# Patient Record
Sex: Female | Born: 1980 | Race: Black or African American | Hispanic: No | Marital: Single | Smoking: Former smoker
Health system: Southern US, Community
[De-identification: ages and names within clinical notes are randomized; demographics above are authoritative.]

## PROBLEM LIST (undated history)

## (undated) DIAGNOSIS — I5022 Chronic systolic (congestive) heart failure: Secondary | ICD-10-CM

## (undated) DIAGNOSIS — R0683 Snoring: Secondary | ICD-10-CM

## (undated) DIAGNOSIS — E119 Type 2 diabetes mellitus without complications: Secondary | ICD-10-CM

## (undated) DIAGNOSIS — H9190 Unspecified hearing loss, unspecified ear: Secondary | ICD-10-CM

## (undated) DIAGNOSIS — J45909 Unspecified asthma, uncomplicated: Secondary | ICD-10-CM

## (undated) DIAGNOSIS — I340 Nonrheumatic mitral (valve) insufficiency: Secondary | ICD-10-CM

## (undated) HISTORY — DX: Unspecified asthma, uncomplicated: J45.909

## (undated) HISTORY — DX: Unspecified hearing loss, unspecified ear: H91.90

## (undated) HISTORY — PX: TONSILLECTOMY: SUR1361

---
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
## 2004-10-09 DIAGNOSIS — O1490 Unspecified pre-eclampsia, unspecified trimester: Secondary | ICD-10-CM

## 2007-11-15 HISTORY — PX: UNILATERAL SALPINGECTOMY: SHX6160

## 2015-02-23 ENCOUNTER — Telehealth (HOSPITAL_COMMUNITY): Payer: Self-pay | Admitting: Vascular Surgery

## 2015-03-02 NOTE — Telephone Encounter (Signed)
Left pt a message for new pt appt

## 2015-03-22 ENCOUNTER — Encounter (HOSPITAL_COMMUNITY): Payer: Self-pay | Admitting: *Deleted

## 2015-03-22 ENCOUNTER — Inpatient Hospital Stay (HOSPITAL_COMMUNITY): Admission: RE | Admit: 2015-03-22 | Payer: Self-pay | Source: Ambulatory Visit

## 2015-08-16 DIAGNOSIS — Z6841 Body Mass Index (BMI) 40.0 and over, adult: Secondary | ICD-10-CM

## 2015-11-01 DIAGNOSIS — I429 Cardiomyopathy, unspecified: Secondary | ICD-10-CM | POA: Insufficient documentation

## 2016-01-14 ENCOUNTER — Emergency Department (HOSPITAL_COMMUNITY): Payer: Medicaid Other

## 2016-01-14 ENCOUNTER — Inpatient Hospital Stay (HOSPITAL_COMMUNITY)
Admission: EM | Admit: 2016-01-14 | Discharge: 2016-01-20 | DRG: 286 | Disposition: A | Discharge status: Home / Self Care | Payer: Medicaid Other | Attending: Internal Medicine | Admitting: Internal Medicine

## 2016-01-14 ENCOUNTER — Encounter (HOSPITAL_COMMUNITY): Payer: Self-pay | Admitting: *Deleted

## 2016-01-14 DIAGNOSIS — R1013 Epigastric pain: Secondary | ICD-10-CM | POA: Diagnosis present

## 2016-01-14 DIAGNOSIS — R1011 Right upper quadrant pain: Secondary | ICD-10-CM | POA: Diagnosis present

## 2016-01-14 DIAGNOSIS — H9193 Unspecified hearing loss, bilateral: Secondary | ICD-10-CM | POA: Diagnosis present

## 2016-01-14 DIAGNOSIS — R9431 Abnormal electrocardiogram [ECG] [EKG]: Secondary | ICD-10-CM | POA: Diagnosis present

## 2016-01-14 DIAGNOSIS — R109 Unspecified abdominal pain: Secondary | ICD-10-CM | POA: Diagnosis present

## 2016-01-14 DIAGNOSIS — Z87891 Personal history of nicotine dependence: Secondary | ICD-10-CM

## 2016-01-14 DIAGNOSIS — R7989 Other specified abnormal findings of blood chemistry: Secondary | ICD-10-CM | POA: Diagnosis present

## 2016-01-14 DIAGNOSIS — I959 Hypotension, unspecified: Secondary | ICD-10-CM | POA: Diagnosis not present

## 2016-01-14 DIAGNOSIS — I272 Other secondary pulmonary hypertension: Secondary | ICD-10-CM | POA: Diagnosis present

## 2016-01-14 DIAGNOSIS — I5021 Acute systolic (congestive) heart failure: Secondary | ICD-10-CM | POA: Diagnosis present

## 2016-01-14 DIAGNOSIS — I5023 Acute on chronic systolic (congestive) heart failure: Principal | ICD-10-CM | POA: Diagnosis present

## 2016-01-14 DIAGNOSIS — Z9189 Other specified personal risk factors, not elsewhere classified: Secondary | ICD-10-CM | POA: Diagnosis not present

## 2016-01-14 DIAGNOSIS — J45909 Unspecified asthma, uncomplicated: Secondary | ICD-10-CM | POA: Diagnosis present

## 2016-01-14 DIAGNOSIS — I429 Cardiomyopathy, unspecified: Secondary | ICD-10-CM | POA: Diagnosis not present

## 2016-01-14 DIAGNOSIS — Z6841 Body Mass Index (BMI) 40.0 and over, adult: Secondary | ICD-10-CM

## 2016-01-14 DIAGNOSIS — I509 Heart failure, unspecified: Secondary | ICD-10-CM | POA: Diagnosis not present

## 2016-01-14 DIAGNOSIS — Z8249 Family history of ischemic heart disease and other diseases of the circulatory system: Secondary | ICD-10-CM | POA: Diagnosis not present

## 2016-01-14 DIAGNOSIS — I340 Nonrheumatic mitral (valve) insufficiency: Secondary | ICD-10-CM | POA: Diagnosis present

## 2016-01-14 DIAGNOSIS — O903 Peripartum cardiomyopathy: Secondary | ICD-10-CM | POA: Diagnosis present

## 2016-01-14 DIAGNOSIS — R0683 Snoring: Secondary | ICD-10-CM | POA: Diagnosis present

## 2016-01-14 HISTORY — DX: Morbid (severe) obesity due to excess calories: E66.01

## 2016-01-14 HISTORY — DX: Nonrheumatic mitral (valve) insufficiency: I34.0

## 2016-01-14 HISTORY — DX: Snoring: R06.83

## 2016-01-14 HISTORY — DX: Chronic systolic (congestive) heart failure: I50.22

## 2016-01-14 LAB — BASIC METABOLIC PANEL
ANION GAP: 8 (ref 5–15)
BUN: 16 mg/dL (ref 6–20)
CALCIUM: 9.1 mg/dL (ref 8.9–10.3)
CO2: 25 mmol/L (ref 22–32)
CREATININE: 1.06 mg/dL — AB (ref 0.44–1.00)
Chloride: 105 mmol/L (ref 101–111)
GFR calc Af Amer: >60 mL/min (ref >60)
GLUCOSE: 95 mg/dL (ref 65–99)
Potassium: 4.6 mmol/L (ref 3.5–5.1)
Sodium: 138 mmol/L (ref 135–145)

## 2016-01-14 LAB — CBC
HCT: 37.9 % (ref 36.0–46.0)
Hemoglobin: 12.8 g/dL (ref 12.0–15.0)
MCH: 25.8 pg — AB (ref 26.0–34.0)
MCHC: 33.8 g/dL (ref 30.0–36.0)
MCV: 76.4 fL — ABNORMAL LOW (ref 78.0–100.0)
PLATELETS: 303 10*3/uL (ref 150–400)
RBC: 4.96 MIL/uL (ref 3.87–5.11)
RDW: 16.5 % — AB (ref 11.5–15.5)
WBC: 7.1 10*3/uL (ref 4.0–10.5)

## 2016-01-14 LAB — URINALYSIS, ROUTINE W REFLEX MICROSCOPIC
BILIRUBIN URINE: NEGATIVE
Glucose, UA: NEGATIVE mg/dL
HGB URINE DIPSTICK: NEGATIVE
Ketones, ur: NEGATIVE mg/dL
Leukocytes, UA: NEGATIVE
Nitrite: NEGATIVE
PH: 6 (ref 5.0–8.0)
Protein, ur: NEGATIVE mg/dL
SPECIFIC GRAVITY, URINE: 1.012 (ref 1.005–1.030)

## 2016-01-14 LAB — I-STAT ARTERIAL BLOOD GAS, ED
Acid-base deficit: 1 mmol/L (ref 0.0–2.0)
Bicarbonate: 22.4 mEq/L (ref 20.0–24.0)
O2 SAT: 95 %
PCO2 ART: 32 mmHg — AB (ref 35.0–45.0)
PH ART: 7.452 — AB (ref 7.350–7.450)
PO2 ART: 69 mmHg — AB (ref 80.0–100.0)
TCO2: 23 mmol/L (ref 0–100)

## 2016-01-14 LAB — D-DIMER, QUANTITATIVE (NOT AT ARMC): D DIMER QUANT: 2 ug{FEU}/mL — AB (ref 0.00–0.50)

## 2016-01-14 LAB — I-STAT TROPONIN, ED: TROPONIN I, POC: 0.01 ng/mL (ref 0.00–0.08)

## 2016-01-14 LAB — HEPATIC FUNCTION PANEL
ALT: 21 U/L (ref 14–54)
AST: 25 U/L (ref 15–41)
Albumin: 3.3 g/dL — ABNORMAL LOW (ref 3.5–5.0)
Alkaline Phosphatase: 49 U/L (ref 38–126)
BILIRUBIN INDIRECT: 1.9 mg/dL — AB (ref 0.3–0.9)
Bilirubin, Direct: 0.6 mg/dL — ABNORMAL HIGH (ref 0.1–0.5)
TOTAL PROTEIN: 7.2 g/dL (ref 6.5–8.1)
Total Bilirubin: 2.5 mg/dL — ABNORMAL HIGH (ref 0.3–1.2)

## 2016-01-14 LAB — LIPASE, BLOOD: LIPASE: 37 U/L (ref 11–51)

## 2016-01-14 LAB — TROPONIN I: Troponin I: <0.03 ng/mL (ref <0.031)

## 2016-01-14 LAB — BRAIN NATRIURETIC PEPTIDE: B Natriuretic Peptide: 647.9 pg/mL — ABNORMAL HIGH (ref 0.0–100.0)

## 2016-01-14 LAB — PREGNANCY, URINE: PREG TEST UR: NEGATIVE

## 2016-01-14 MED ORDER — IOPAMIDOL (ISOVUE-370) INJECTION 76%
INTRAVENOUS | Status: AC
  Administered 2016-01-14: 100 mL
  Filled 2016-01-14: qty 100

## 2016-01-14 MED ORDER — HEPARIN SODIUM (PORCINE) 5000 UNIT/ML IJ SOLN
5000.0000 [IU] | Freq: Three times a day (TID) | INTRAMUSCULAR | Status: DC
  Administered 2016-01-14 – 2016-01-17 (×8): 5000 [IU] via SUBCUTANEOUS
  Filled 2016-01-14 (×9): qty 1

## 2016-01-14 MED ORDER — SODIUM CHLORIDE 0.9% FLUSH
3.0000 mL | Freq: Two times a day (BID) | INTRAVENOUS | Status: DC
  Administered 2016-01-14 – 2016-01-19 (×10): 3 mL via INTRAVENOUS

## 2016-01-14 MED ORDER — SODIUM CHLORIDE 0.9 % IV SOLN: 250.0000 mL | INTRAVENOUS | Status: DC | PRN

## 2016-01-14 MED ORDER — ONDANSETRON HCL 4 MG/2ML IJ SOLN
4.0000 mg | Freq: Once | INTRAMUSCULAR | Status: AC
  Administered 2016-01-14: 4 mg via INTRAVENOUS
  Filled 2016-01-14: qty 2

## 2016-01-14 MED ORDER — LORATADINE 10 MG PO TABS: 10.0000 mg | ORAL_TABLET | Freq: Every day | ORAL | Status: DC

## 2016-01-14 MED ORDER — CARVEDILOL 6.25 MG PO TABS
6.2500 mg | ORAL_TABLET | Freq: Two times a day (BID) | ORAL | Status: DC
  Administered 2016-01-15 – 2016-01-17 (×6): 6.25 mg via ORAL
  Filled 2016-01-14 (×6): qty 1

## 2016-01-14 MED ORDER — FUROSEMIDE 10 MG/ML IJ SOLN
40.0000 mg | Freq: Two times a day (BID) | INTRAMUSCULAR | Status: DC
  Administered 2016-01-14 – 2016-01-17 (×6): 40 mg via INTRAVENOUS
  Filled 2016-01-14 (×6): qty 4

## 2016-01-14 MED ORDER — ALBUTEROL SULFATE (2.5 MG/3ML) 0.083% IN NEBU
2.5000 mg | INHALATION_SOLUTION | Freq: Four times a day (QID) | RESPIRATORY_TRACT | Status: DC | PRN
  Administered 2016-01-18: 2.5 mg via RESPIRATORY_TRACT
  Filled 2016-01-14: qty 3

## 2016-01-14 MED ORDER — ONDANSETRON HCL 4 MG/2ML IJ SOLN: 4.0000 mg | Freq: Four times a day (QID) | INTRAMUSCULAR | Status: DC | PRN

## 2016-01-14 MED ORDER — BECLOMETHASONE DIPROPIONATE 40 MCG/ACT IN AERS: 1.0000 | INHALATION_SPRAY | Freq: Two times a day (BID) | RESPIRATORY_TRACT | Status: DC

## 2016-01-14 MED ORDER — ASPIRIN EC 81 MG PO TBEC
81.0000 mg | DELAYED_RELEASE_TABLET | Freq: Every day | ORAL | Status: DC
  Administered 2016-01-15 – 2016-01-20 (×6): 81 mg via ORAL
  Filled 2016-01-14 (×6): qty 1

## 2016-01-14 MED ORDER — FUROSEMIDE 10 MG/ML IJ SOLN
60.0000 mg | Freq: Once | INTRAMUSCULAR | Status: AC
  Administered 2016-01-14: 60 mg via INTRAVENOUS
  Filled 2016-01-14: qty 6

## 2016-01-14 MED ORDER — SODIUM CHLORIDE 0.9% FLUSH: 3.0000 mL | INTRAVENOUS | Status: DC | PRN

## 2016-01-14 MED ORDER — ACETAMINOPHEN 325 MG PO TABS
650.0000 mg | ORAL_TABLET | ORAL | Status: DC | PRN
  Administered 2016-01-15 – 2016-01-17 (×2): 650 mg via ORAL
  Filled 2016-01-14 (×2): qty 2

## 2016-01-14 NOTE — ED Notes (Signed)
Pt reports hx of chf, having increase in sob x psat week, gets worse when lying down. Swelling noted to feet.

## 2016-01-14 NOTE — ED Provider Notes (Signed)
CSN: [id number]     Arrival date & time 01/14/16  1038 History   First MD Initiated Contact with Patient 01/14/16 1159     Chief Complaint  Patient presents with  . Shortness of Breath     (Consider location/radiation/quality/duration/timing/severity/associated sxs/prior Treatment) HPI Comments: Patient presents with a one-week history of shortness of breath over the past week worse with lying down. Associated with worsening leg swelling and epigastric pain that is constant. Denies chest pain. Patient with history of heart failure and gets her care through [facility]. Sounds like her heart failure started after pregnancy. She states compliance with her Lasix and Lopressor and losartan. Denies ever having a stress test or catheterization. States she has difficulty lying flat at baseline and has to sleep sitting up. Do not say whether her weight has increased. She states she doubled her Lasix dose without much effect. Denies any nausea or vomiting but has had some dry heaves. Endorses constant epigastric pain that is new for her. No chest pain. No urinary symptoms. No previous abdominal surgeries. Her cardiologist is through [facility].  Patient is a 35 y.o. female presenting with shortness of breath. The history is provided by the patient. The history is limited by the condition of the patient.  Shortness of Breath Associated symptoms: abdominal pain   Associated symptoms: no chest pain, no fever, no headaches and no vomiting     Past Medical History  Diagnosis Date  . Hearing loss     bilateral  . Asthma   . Heart failure (HCC)   . Obesity   . CHF (congestive heart failure) (HCC)    History reviewed. No pertinent past surgical history. Family History  Problem Relation Age of Onset  . Heart attack Mother   . Hyperlipidemia Father   . Diabetes Brother    Social History  Substance Use Topics  . Smoking status: Former Games developer  . Smokeless tobacco: None  . Alcohol Use: 0.0 oz/week    0 Standard  drinks or equivalent per week     Comment: occasionally   OB History    No data available     Review of Systems  Constitutional: Positive for activity change and appetite change. Negative for fever.  HENT: Negative for congestion.   Respiratory: Positive for chest tightness and shortness of breath.   Cardiovascular: Positive for leg swelling. Negative for chest pain.  Gastrointestinal: Positive for abdominal pain. Negative for nausea and vomiting.  Genitourinary: Negative for dysuria, hematuria, vaginal bleeding and vaginal discharge.  Musculoskeletal: Negative for myalgias and arthralgias.  Neurological: Negative for dizziness, weakness and headaches.  A complete 10 system review of systems was obtained and all systems are negative except as noted in the HPI and PMH.      Allergies  Review of patient's allergies indicates no known allergies.  Home Medications   Prior to Admission medications   Medication Sig Start Date End Date Taking? Authorizing Provider  albuterol (PROAIR HFA) 108 (90 BASE) MCG/ACT inhaler Inhale 2 puffs into the lungs every 6 (six) hours as needed for wheezing or shortness of breath.   Yes Historical Provider, MD  furosemide (LASIX) 20 MG tablet Take 20 mg by mouth See admin instructions. Take 1 tablet (20 mg) by mouth with a 40 mg tablet (total 60 mg dose) every morning, may also take a 2nd tablet (20 mg) later in the day as needed for shortness of breath 01/05/16  Yes Historical Provider, MD  furosemide (LASIX) 40 MG tablet Take  40 mg by mouth daily. Take with a 20 mg tablet for a 60 mg dose   Yes Historical Provider, MD  metoprolol tartrate (LOPRESSOR) 25 MG tablet Take 25 mg by mouth 2 (two) times daily.   Yes Historical Provider, MD  valsartan (DIOVAN) 160 MG tablet Take 160 mg by mouth daily. 12/20/15  Yes Historical Provider, MD   BP 111/62 mmHg  Pulse 107  Temp(Src) 98 F (36.7 C) (Oral)  Resp 20  Ht 5\' 4"  (1.626 m)  Wt 332 lb 0.2 oz (150.6 kg)  BMI  56.96 kg/m2  SpO2 99%  LMP 12/20/2015 Physical Exam  Constitutional: She is oriented to person, place, and time. She appears well-developed and well-nourished. She appears distressed.  Tachypnea to 30s, tachycardia to 130s Speaking in short sentences  HENT:  Head: Normocephalic and atraumatic.  Mouth/Throat: Oropharynx is clear and moist. No oropharyngeal exudate.  Eyes: Conjunctivae and EOM are normal. Pupils are equal, round, and reactive to light.  Neck: Normal range of motion. Neck supple.  No meningismus.  Cardiovascular: Normal rate, normal heart sounds and intact distal pulses.   No murmur heard. Regular tachycardia  Pulmonary/Chest: She is in respiratory distress. She has rales.  Abdominal: Soft. There is tenderness. There is no rebound and no guarding.  Epigastric tenderness, no RUQ tenderness  Musculoskeletal: Normal range of motion. She exhibits edema. She exhibits no tenderness.  +2 edema to knees  Neurological: She is alert and oriented to person, place, and time. No cranial nerve deficit. She exhibits normal muscle tone. Coordination normal.  No ataxia on finger to nose bilaterally. No pronator drift. 5/5 strength throughout. CN 2-12 intact.Equal grip strength. Sensation intact.   Skin: Skin is warm.  Psychiatric: She has a normal mood and affect. Her behavior is normal.  Nursing note and vitals reviewed.   ED Course  Procedures (including critical care time) Labs Review Labs Reviewed  BASIC METABOLIC PANEL - Abnormal; Notable for the following:    Creatinine, Ser 1.06 (*)    All other components within normal limits  CBC - Abnormal; Notable for the following:    MCV 76.4 (*)    MCH 25.8 (*)    RDW 16.5 (*)    All other components within normal limits  BRAIN NATRIURETIC PEPTIDE - Abnormal; Notable for the following:    B Natriuretic Peptide 647.9 (*)    All other components within normal limits  D-DIMER, QUANTITATIVE (NOT AT [facility]) - Abnormal; Notable for the  following:    D-Dimer, Quant 2.00 (*)    All other components within normal limits  HEPATIC FUNCTION PANEL - Abnormal; Notable for the following:    Albumin 3.3 (*)    Total Bilirubin 2.5 (*)    Bilirubin, Direct 0.6 (*)    Indirect Bilirubin 1.9 (*)    All other components within normal limits  I-STAT ARTERIAL BLOOD GAS, ED - Abnormal; Notable for the following:    pH, Arterial 7.452 (*)    pCO2 arterial 32.0 (*)    pO2, Arterial 69.0 (*)    All other components within normal limits  TROPONIN I  URINALYSIS, ROUTINE W REFLEX MICROSCOPIC (NOT AT [facility])  PREGNANCY, URINE  LIPASE, BLOOD  BASIC METABOLIC PANEL  BRAIN NATRIURETIC PEPTIDE  I-STAT TROPOININ, ED    Imaging Review Dg Chest 2 View  01/14/2016  CLINICAL DATA:  SOB and abdominal pain x 2 weeks. She states that today it was much worst. Hx of congestive heart failure, ex-smoker, asthma, obesity.  EXAM: CHEST  2 VIEW COMPARISON:  None. FINDINGS: There is moderate to severe cardiac silhouette enlargement. There is mild diffuse interstitial prominence with mild azygos vein distension. No pleural effusion. IMPRESSION: Findings suggest congestive heart failure with mild interstitial edema. Electronically Signed   By: Esperanza Heir M.D.   On: 01/14/2016 12:17   Ct Angio Chest Pe W/cm &/or Wo Cm  01/14/2016  CLINICAL DATA:  Shortness of breath, epigastric abdominal pain for 2 weeks. EXAM: CT ANGIOGRAPHY CHEST CT ABDOMEN AND PELVIS WITH CONTRAST TECHNIQUE: Multidetector CT imaging of the chest was performed using the standard protocol during bolus administration of intravenous contrast. Multiplanar CT image reconstructions and MIPs were obtained to evaluate the vascular anatomy. Multidetector CT imaging of the abdomen and pelvis was performed using the standard protocol during bolus administration of intravenous contrast. CONTRAST:  100 mL of Isovue 370 intravenously. COMPARISON:  Chest radiograph of same day. FINDINGS: CTA CHEST FINDINGS No  pneumothorax or pleural effusion is noted. No acute pulmonary disease is noted. Mild cardiomegaly is noted. There is no evidence of thoracic aortic aneurysm. There is no evidence of pulmonary embolus. No mediastinal mass or adenopathy is noted. No significant osseous abnormality is noted in the chest. CT ABDOMEN and PELVIS FINDINGS No significant osseous abnormality is noted. Fatty infiltration of the liver is noted. The spleen and pancreas are unremarkable. Adrenal glands and kidneys are unremarkable. No hydronephrosis or renal obstruction is noted. Mild gallbladder wall thickening is noted with probable mild pericholecystic fluid concerning for cholecystitis. No gallstones are noted. Small amount of fluid is noted around the lateral portion of the right hepatic lobe. There is no evidence of bowel obstruction. The appendix appears normal. Urinary bladder appears normal. Uterus and ovaries appear unremarkable. Mild amount of free fluid is noted in the right lower quadrant of unknown etiology. No significant adenopathy is noted. Review of the MIP images confirms the above findings. IMPRESSION: No evidence of pulmonary embolus. Mild cardiomegaly is noted. No acute abnormality seen in the chest. Fatty infiltration of the liver. Mild gallbladder wall thickening is noted with probable pericholecystic fluid concerning for acute cholecystitis. No gallstones are noted. HIDA scan may be performed further evaluation. Mild amount of free fluid is noted in the right lower quadrant of unknown etiology. Possible ruptured ovarian cyst cannot be excluded. Pelvic ultrasound may be performed for further evaluation. The appendix appears normal. Electronically Signed   By: Lupita Raider, M.D.   On: 01/14/2016 15:38   Ct Abdomen Pelvis W Contrast  01/14/2016  CLINICAL DATA:  Shortness of breath, epigastric abdominal pain for 2 weeks. EXAM: CT ANGIOGRAPHY CHEST CT ABDOMEN AND PELVIS WITH CONTRAST TECHNIQUE: Multidetector CT imaging  of the chest was performed using the standard protocol during bolus administration of intravenous contrast. Multiplanar CT image reconstructions and MIPs were obtained to evaluate the vascular anatomy. Multidetector CT imaging of the abdomen and pelvis was performed using the standard protocol during bolus administration of intravenous contrast. CONTRAST:  100 mL of Isovue 370 intravenously. COMPARISON:  Chest radiograph of same day. FINDINGS: CTA CHEST FINDINGS No pneumothorax or pleural effusion is noted. No acute pulmonary disease is noted. Mild cardiomegaly is noted. There is no evidence of thoracic aortic aneurysm. There is no evidence of pulmonary embolus. No mediastinal mass or adenopathy is noted. No significant osseous abnormality is noted in the chest. CT ABDOMEN and PELVIS FINDINGS No significant osseous abnormality is noted. Fatty infiltration of the liver is noted. The spleen and pancreas are  unremarkable. Adrenal glands and kidneys are unremarkable. No hydronephrosis or renal obstruction is noted. Mild gallbladder wall thickening is noted with probable mild pericholecystic fluid concerning for cholecystitis. No gallstones are noted. Small amount of fluid is noted around the lateral portion of the right hepatic lobe. There is no evidence of bowel obstruction. The appendix appears normal. Urinary bladder appears normal. Uterus and ovaries appear unremarkable. Mild amount of free fluid is noted in the right lower quadrant of unknown etiology. No significant adenopathy is noted. Review of the MIP images confirms the above findings. IMPRESSION: No evidence of pulmonary embolus. Mild cardiomegaly is noted. No acute abnormality seen in the chest. Fatty infiltration of the liver. Mild gallbladder wall thickening is noted with probable pericholecystic fluid concerning for acute cholecystitis. No gallstones are noted. HIDA scan may be performed further evaluation. Mild amount of free fluid is noted in the right  lower quadrant of unknown etiology. Possible ruptured ovarian cyst cannot be excluded. Pelvic ultrasound may be performed for further evaluation. The appendix appears normal. Electronically Signed   By: Lupita Raider, M.D.   On: 01/14/2016 15:38   US Abdomen Limited Ruq  01/14/2016  CLINICAL DATA:  36 year old female with history of right upper quadrant abdominal pain for the past 3 weeks. EXAM: US ABDOMEN LIMITED - RIGHT UPPER QUADRANT COMPARISON:  No priors. FINDINGS: Gallbladder: No gallstones or wall thickening visualized. No sonographic Murphy sign noted by sonographer. Common bile duct: Diameter: 3.4 mm Liver: No focal lesion identified. Within normal limits in parenchymal echogenicity. IMPRESSION: 1. No acute findings. Specifically, no gallstones or findings to suggest an acute cholecystitis at this time. Electronically Signed   By: Trudie Reed M.D.   On: 01/14/2016 17:18   I have personally reviewed and evaluated these images and lab results as part of my medical decision-making.   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 No chest pain but does have constant epigastric pain. EKG is sinus tachycardia with inferior lateral T-wave inversions, no comparison.  Record review shows patient with EF of 20-25% with severe pulmonary hypertension and mitral regurgitation.  CXR shows edema.  IV lasix given.  Cr normal.   Troponin negative BNP elevated.  No CO2 retention on ABG D-dimer elevated. No evidence of PE.  Some fluid around gallbladder, doubt cholecystitis, will obtain US. D/w Dr. Tommie Raymond who will see.  Admission for IV diuresis dw Dr. Rennis Golden.   Glynn Octave, MD 01/14/16 [phone]

## 2016-01-14 NOTE — ED Notes (Signed)
Pt just off  bedpan

## 2016-01-14 NOTE — ED Notes (Signed)
Report called  

## 2016-01-14 NOTE — ED Notes (Signed)
Respiratory at bedside.

## 2016-01-14 NOTE — ED Notes (Signed)
Patient reported nausea Doctor notified at bedside.

## 2016-01-14 NOTE — Consult Note (Signed)
Reason for Consult:abdominal pain Referring Physician: Annie Main Rancour  Kirsten Wheeler is an 35 y.o. female.  HPI: 35 yo female with 1 day of shortness of breath and epigastric pain. The pain is described as "heavy". She notes it gets worse within two bites of when she tries to eat. She had nausea earlier in the day and attempted to vomit. She denies fevers or chills.  Past Medical History  Diagnosis Date  . Hearing loss     bilateral  . Asthma   . Heart failure ([place])   . Obesity   . CHF (congestive heart failure) ([place])     History reviewed. No pertinent past surgical history.  Family History  Problem Relation Age of Onset  . Heart attack Mother   . Hyperlipidemia Father   . Diabetes Brother     Social History:  reports that she has quit smoking. She does not have any smokeless tobacco history on file. She reports that she drinks alcohol. She reports that she uses illicit drugs (Marijuana).  Allergies: No Known Allergies  Medications: I have reviewed the patient's current medications.  Results for orders placed or performed during the hospital encounter of 01/14/16 (from the past 48 hour(s))  Basic metabolic panel     Status: Abnormal   Collection Time: 01/14/16 11:15 AM  Result Value Ref Range   Sodium 138 135 - 145 mmol/L   Potassium 4.6 3.5 - 5.1 mmol/L   Chloride 105 101 - 111 mmol/L   CO2 25 22 - 32 mmol/L   Glucose, Bld 95 65 - 99 mg/dL   BUN 16 6 - 20 mg/dL   Creatinine, Ser 1.06 (H) 0.44 - 1.00 mg/dL   Calcium 9.1 8.9 - 10.3 mg/dL   GFR calc non Af Amer >60 >60 mL/min   GFR calc Af Amer >60 >60 mL/min    Comment: (NOTE) The eGFR has been calculated using the CKD EPI equation. This calculation has not been validated in all clinical situations. eGFR's persistently <60 mL/min signify possible Chronic Kidney Disease.    Anion gap 8 5 - 15  CBC     Status: Abnormal   Collection Time: 01/14/16 11:15 AM  Result Value Ref Range   WBC 7.1 4.0 - 10.5 K/uL   RBC  4.96 3.87 - 5.11 MIL/uL   Hemoglobin 12.8 12.0 - 15.0 g/dL   HCT 37.9 36.0 - 46.0 %   MCV 76.4 (L) 78.0 - 100.0 fL   MCH 25.8 (L) 26.0 - 34.0 pg   MCHC 33.8 30.0 - 36.0 g/dL   RDW 16.5 (H) 11.5 - 15.5 %   Platelets 303 150 - 400 K/uL  I-stat troponin, ED     Status: None   Collection Time: 01/14/16 11:31 AM  Result Value Ref Range   Troponin i, poc 0.01 0.00 - 0.08 ng/mL   Comment 3            Comment: Due to the release kinetics of cTnI, a negative result within the first hours of the onset of symptoms does not rule out myocardial infarction with certainty. If myocardial infarction is still suspected, repeat the test at appropriate intervals.   Brain natriuretic peptide     Status: Abnormal   Collection Time: 01/14/16 12:10 PM  Result Value Ref Range   B Natriuretic Peptide 647.9 (H) 0.0 - 100.0 pg/mL  Troponin I     Status: None   Collection Time: 01/14/16 12:10 PM  Result Value Ref Range   Troponin  I <0.03 <0.031 ng/mL    Comment:        NO INDICATION OF MYOCARDIAL INJURY.   D-dimer, quantitative (not at [facility])     Status: Abnormal   Collection Time: 01/14/16 12:10 PM  Result Value Ref Range   D-Dimer, Quant 2.00 (H) 0.00 - 0.50 ug/mL-FEU    Comment: (NOTE) At the manufacturer cut-off of 0.50 ug/mL FEU, this assay has been documented to exclude PE with a sensitivity and negative predictive value of 97 to 99%.  At this time, this assay has not been approved by the FDA to exclude DVT/VTE. Results should be correlated with clinical presentation.   Hepatic function panel     Status: Abnormal   Collection Time: 01/14/16 12:10 PM  Result Value Ref Range   Total Protein 7.2 6.5 - 8.1 g/dL   Albumin 3.3 (L) 3.5 - 5.0 g/dL   AST 25 15 - 41 U/L   ALT 21 14 - 54 U/L   Alkaline Phosphatase 49 38 - 126 U/L   Total Bilirubin 2.5 (H) 0.3 - 1.2 mg/dL   Bilirubin, Direct 0.6 (H) 0.1 - 0.5 mg/dL   Indirect Bilirubin 1.9 (H) 0.3 - 0.9 mg/dL  Lipase, blood     Status: None    Collection Time: 01/14/16 12:10 PM  Result Value Ref Range   Lipase 37 11 - 51 U/L  I-Stat Arterial Blood Gas, ED - (order at [facility] and [facility] only)     Status: Abnormal   Collection Time: 01/14/16 12:24 PM  Result Value Ref Range   pH, Arterial 7.452 (H) 7.350 - 7.450   pCO2 arterial 32.0 (L) 35.0 - 45.0 mmHg   pO2, Arterial 69.0 (L) 80.0 - 100.0 mmHg   Bicarbonate 22.4 20.0 - 24.0 mEq/L   TCO2 23 0 - 100 mmol/L   O2 Saturation 95.0 %   Acid-base deficit 1.0 0.0 - 2.0 mmol/L   Patient temperature 98.2 F    Collection site RADIAL, ALLEN'S TEST ACCEPTABLE    Drawn by RT    Sample type ARTERIAL   Urinalysis, Routine w reflex microscopic (not at [facility])     Status: None   Collection Time: 01/14/16 12:50 PM  Result Value Ref Range   Color, Urine YELLOW YELLOW   APPearance CLEAR CLEAR   Specific Gravity, Urine 1.012 1.005 - 1.030   pH 6.0 5.0 - 8.0   Glucose, UA NEGATIVE NEGATIVE mg/dL   Hgb urine dipstick NEGATIVE NEGATIVE   Bilirubin Urine NEGATIVE NEGATIVE   Ketones, ur NEGATIVE NEGATIVE mg/dL   Protein, ur NEGATIVE NEGATIVE mg/dL   Nitrite NEGATIVE NEGATIVE   Leukocytes, UA NEGATIVE NEGATIVE    Comment: MICROSCOPIC NOT DONE ON URINES WITH NEGATIVE PROTEIN, BLOOD, LEUKOCYTES, NITRITE, OR GLUCOSE <1000 mg/dL.  Pregnancy, urine     Status: None   Collection Time: 01/14/16 12:50 PM  Result Value Ref Range   Preg Test, Ur NEGATIVE NEGATIVE    Comment:        THE SENSITIVITY OF THIS METHODOLOGY IS >20 mIU/mL.     Dg Chest 2 View  01/14/2016  CLINICAL DATA:  SOB and abdominal pain x 2 weeks. She states that today it was much worst. Hx of congestive heart failure, ex-smoker, asthma, obesity. EXAM: CHEST  2 VIEW COMPARISON:  None. FINDINGS: There is moderate to severe cardiac silhouette enlargement. There is mild diffuse interstitial prominence with mild azygos vein distension. No pleural effusion. IMPRESSION: Findings suggest congestive heart failure with mild interstitial edema.  Electronically  Signed   By: Skipper Cliche M.D.   On: 01/14/2016 12:17   Ct Angio Chest Pe W/cm &/or Wo Cm  01/14/2016  CLINICAL DATA:  Shortness of breath, epigastric abdominal pain for 2 weeks. EXAM: CT ANGIOGRAPHY CHEST CT ABDOMEN AND PELVIS WITH CONTRAST TECHNIQUE: Multidetector CT imaging of the chest was performed using the standard protocol during bolus administration of intravenous contrast. Multiplanar CT image reconstructions and MIPs were obtained to evaluate the vascular anatomy. Multidetector CT imaging of the abdomen and pelvis was performed using the standard protocol during bolus administration of intravenous contrast. CONTRAST:  100 mL of Isovue 370 intravenously. COMPARISON:  Chest radiograph of same day. FINDINGS: CTA CHEST FINDINGS No pneumothorax or pleural effusion is noted. No acute pulmonary disease is noted. Mild cardiomegaly is noted. There is no evidence of thoracic aortic aneurysm. There is no evidence of pulmonary embolus. No mediastinal mass or adenopathy is noted. No significant osseous abnormality is noted in the chest. CT ABDOMEN and PELVIS FINDINGS No significant osseous abnormality is noted. Fatty infiltration of the liver is noted. The spleen and pancreas are unremarkable. Adrenal glands and kidneys are unremarkable. No hydronephrosis or renal obstruction is noted. Mild gallbladder wall thickening is noted with probable mild pericholecystic fluid concerning for cholecystitis. No gallstones are noted. Small amount of fluid is noted around the lateral portion of the right hepatic lobe. There is no evidence of bowel obstruction. The appendix appears normal. Urinary bladder appears normal. Uterus and ovaries appear unremarkable. Mild amount of free fluid is noted in the right lower quadrant of unknown etiology. No significant adenopathy is noted. Review of the MIP images confirms the above findings. IMPRESSION: No evidence of pulmonary embolus. Mild cardiomegaly is noted. No acute  abnormality seen in the chest. Fatty infiltration of the liver. Mild gallbladder wall thickening is noted with probable pericholecystic fluid concerning for acute cholecystitis. No gallstones are noted. HIDA scan may be performed further evaluation. Mild amount of free fluid is noted in the right lower quadrant of unknown etiology. Possible ruptured ovarian cyst cannot be excluded. Pelvic ultrasound may be performed for further evaluation. The appendix appears normal. Electronically Signed   By: Marijo Conception, M.D.   On: 01/14/2016 15:38   Ct Abdomen Pelvis W Contrast  01/14/2016  CLINICAL DATA:  Shortness of breath, epigastric abdominal pain for 2 weeks. EXAM: CT ANGIOGRAPHY CHEST CT ABDOMEN AND PELVIS WITH CONTRAST TECHNIQUE: Multidetector CT imaging of the chest was performed using the standard protocol during bolus administration of intravenous contrast. Multiplanar CT image reconstructions and MIPs were obtained to evaluate the vascular anatomy. Multidetector CT imaging of the abdomen and pelvis was performed using the standard protocol during bolus administration of intravenous contrast. CONTRAST:  100 mL of Isovue 370 intravenously. COMPARISON:  Chest radiograph of same day. FINDINGS: CTA CHEST FINDINGS No pneumothorax or pleural effusion is noted. No acute pulmonary disease is noted. Mild cardiomegaly is noted. There is no evidence of thoracic aortic aneurysm. There is no evidence of pulmonary embolus. No mediastinal mass or adenopathy is noted. No significant osseous abnormality is noted in the chest. CT ABDOMEN and PELVIS FINDINGS No significant osseous abnormality is noted. Fatty infiltration of the liver is noted. The spleen and pancreas are unremarkable. Adrenal glands and kidneys are unremarkable. No hydronephrosis or renal obstruction is noted. Mild gallbladder wall thickening is noted with probable mild pericholecystic fluid concerning for cholecystitis. No gallstones are noted. Small amount of  fluid is noted around the lateral portion  of the right hepatic lobe. There is no evidence of bowel obstruction. The appendix appears normal. Urinary bladder appears normal. Uterus and ovaries appear unremarkable. Mild amount of free fluid is noted in the right lower quadrant of unknown etiology. No significant adenopathy is noted. Review of the MIP images confirms the above findings. IMPRESSION: No evidence of pulmonary embolus. Mild cardiomegaly is noted. No acute abnormality seen in the chest. Fatty infiltration of the liver. Mild gallbladder wall thickening is noted with probable pericholecystic fluid concerning for acute cholecystitis. No gallstones are noted. HIDA scan may be performed further evaluation. Mild amount of free fluid is noted in the right lower quadrant of unknown etiology. Possible ruptured ovarian cyst cannot be excluded. Pelvic ultrasound may be performed for further evaluation. The appendix appears normal. Electronically Signed   By: Marijo Conception, M.D.   On: 01/14/2016 15:38   US Abdomen Limited Ruq  01/14/2016  CLINICAL DATA:  35 year old female with history of right upper quadrant abdominal pain for the past 3 weeks. EXAM: US ABDOMEN LIMITED - RIGHT UPPER QUADRANT COMPARISON:  No priors. FINDINGS: Gallbladder: No gallstones or wall thickening visualized. No sonographic Murphy sign noted by sonographer. Common bile duct: Diameter: 3.4 mm Liver: No focal lesion identified. Within normal limits in parenchymal echogenicity. IMPRESSION: 1. No acute findings. Specifically, no gallstones or findings to suggest an acute cholecystitis at this time. Electronically Signed   By: Vinnie Langton M.D.   On: 01/14/2016 17:18    Review of Systems  Constitutional: Negative for fever and chills.  HENT: Negative for hearing loss.   Eyes: Negative for blurred vision and double vision.  Respiratory: Positive for shortness of breath. Negative for cough and hemoptysis.   Cardiovascular: Positive for  chest pain and leg swelling. Negative for palpitations.  Gastrointestinal: Positive for nausea and abdominal pain. Negative for vomiting.  Genitourinary: Positive for frequency. Negative for dysuria and urgency.  Musculoskeletal: Negative for myalgias and neck pain.  Skin: Negative for itching and rash.  Neurological: Negative for dizziness, tingling and headaches.  Endo/Heme/Allergies: Does not bruise/bleed easily.  Psychiatric/Behavioral: Negative for depression and suicidal ideas.   Blood pressure 111/62, pulse 107, temperature 98 F (36.7 C), temperature source Oral, resp. rate 20, height 5' 4"  (1.626 m), weight 150.6 kg (332 lb 0.2 oz), last menstrual period 12/20/2015, SpO2 99 %. Physical Exam  Assessment/Plan: 35 yo female with history of CHF, here with acute exacerbation, she complains of epigastric pain. Initial CT scan was concerning for thick gallbladder wall, US shows no stones.  -I think this is most likely a result of her overload leading to the relative hepatomegaly seen on CT causing this pain.  -If biliary disease is of high concern, a HIDA could be obtained. She is a poor operative candidate, so biliary dyskinesia would be treated with dietary modification in any case and she has no sign of acute infection to warrant a drainage procedure.   Kirsten Wheeler 01/14/2016, 11:37 PM

## 2016-01-14 NOTE — H&P (Signed)
ADMISSION HISTORY & PHYSICAL   Chief Complaint:  Dyspnea, CHF  Cardiologist: Dr. Geraldo Pitter [facility] [place])  Primary Care Physician: No primary care provider on file.  HPI:  This is a 35 y.o. 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 She is followed by Dr. Geraldo Pitter at [facility] in [place]. She last saw him in the office in February at which time he was going to repeat an echocardiogram and had a discussion about possible AICD or LifeVest with her which she was refusing. Her last echocardiogram with [place] [facility] in December 2016 showed severely dilated LV and global hypokinesis with EF of 20-25%, moderate MR, mild TR and moderate to severe pulmonary hypertension. She now presents with one week of worsening shortness of breath and orthopnea. She's also noted worsening lower extremity edema. Review of her previous cardiology notes indicated that she has not had either invasive or noninvasive evaluation of ischemia. A left heart catheterization was proposed, but canceled after the patient watched the Video and did not wish to proceed. She's been given IV Lasix here and reports an improvement in her symptoms. D-dimer was elevated at 2. A CT angiogram was performed which demonstrated mild cardiomegaly, fatty infiltration in the liver, no pulmonary embolus, mild gallbladder wall thickening. Chest x-ray shows features consistent with congestive heart failure and mild interstitial edema. BNP was elevated at 648.  PMHx:  Past Medical History  Diagnosis Date  . Hearing loss     bilateral  . Asthma   . Heart failure ([place])   . Obesity   . CHF (congestive heart failure) ([place])     History reviewed. No pertinent past surgical history.  FAMHx:  Family History  Problem Relation Age of Onset  . Heart attack Mother   . Hyperlipidemia Father   . Diabetes Brother     SOCHx:   reports that she has quit  smoking. She does not have any smokeless tobacco history on file. She reports that she drinks alcohol. She reports that she uses illicit drugs (Marijuana).  ALLERGIES:  No Known Allergies  ROS: Pertinent items noted in HPI and remainder of comprehensive ROS otherwise negative.  HOME MEDS:   Medication List    ASK your doctor about these medications        beclomethasone 40 MCG/ACT inhaler  Commonly known as:  QVAR  Inhale 1 puff into the lungs 2 (two) times daily.     cetirizine 10 MG tablet  Commonly known as:  ZYRTEC  Take 10 mg by mouth daily.     furosemide 40 MG tablet  Commonly known as:  LASIX  Take 40 mg by mouth.     IUD'S IU  by Intrauterine route.     metoprolol tartrate 25 MG tablet  Commonly known as:  LOPRESSOR  Take 25 mg by mouth 2 (two) times daily.     PROAIR HFA 108 (90 Base) MCG/ACT inhaler  Generic drug:  albuterol  Inhale 2 puffs into the lungs every 6 (six) hours as needed for wheezing or shortness of breath.        LABS/IMAGING: Results for orders placed or performed during the hospital encounter of 01/14/16 (from the past 48 hour(s))  Basic metabolic panel     Status: Abnormal   Collection Time: 01/14/16 11:15 AM  Result Value Ref Range   Sodium 138 135 - 145 mmol/L   Potassium 4.6 3.5 - 5.1 mmol/L   Chloride  105 101 - 111 mmol/L   CO2 25 22 - 32 mmol/L   Glucose, Bld 95 65 - 99 mg/dL   BUN 16 6 - 20 mg/dL   Creatinine, Ser 1.06 (H) 0.44 - 1.00 mg/dL   Calcium 9.1 8.9 - 10.3 mg/dL   GFR calc non Af Amer >60 >60 mL/min   GFR calc Af Amer >60 >60 mL/min    Comment: (NOTE) The eGFR has been calculated using the CKD EPI equation. This calculation has not been validated in all clinical situations. eGFR's persistently <60 mL/min signify possible Chronic Kidney Disease.    Anion gap 8 5 - 15  CBC     Status: Abnormal   Collection Time: 01/14/16 11:15 AM  Result Value Ref Range   WBC 7.1 4.0 - 10.5 K/uL   RBC 4.96 3.87 - 5.11 MIL/uL     Hemoglobin 12.8 12.0 - 15.0 g/dL   HCT 37.9 36.0 - 46.0 %   MCV 76.4 (L) 78.0 - 100.0 fL   MCH 25.8 (L) 26.0 - 34.0 pg   MCHC 33.8 30.0 - 36.0 g/dL   RDW 16.5 (H) 11.5 - 15.5 %   Platelets 303 150 - 400 K/uL  I-stat troponin, ED     Status: None   Collection Time: 01/14/16 11:31 AM  Result Value Ref Range   Troponin i, poc 0.01 0.00 - 0.08 ng/mL   Comment 3            Comment: Due to the release kinetics of cTnI, a negative result within the first hours of the onset of symptoms does not rule out myocardial infarction with certainty. If myocardial infarction is still suspected, repeat the test at appropriate intervals.   Brain natriuretic peptide     Status: Abnormal   Collection Time: 01/14/16 12:10 PM  Result Value Ref Range   B Natriuretic Peptide 647.9 (H) 0.0 - 100.0 pg/mL  Troponin I     Status: None   Collection Time: 01/14/16 12:10 PM  Result Value Ref Range   Troponin I <0.03 <0.031 ng/mL    Comment:        NO INDICATION OF MYOCARDIAL INJURY.   D-dimer, quantitative (not at [facility])     Status: Abnormal   Collection Time: 01/14/16 12:10 PM  Result Value Ref Range   D-Dimer, Quant 2.00 (H) 0.00 - 0.50 ug/mL-FEU    Comment: (NOTE) At the manufacturer cut-off of 0.50 ug/mL FEU, this assay has been documented to exclude PE with a sensitivity and negative predictive value of 97 to 99%.  At this time, this assay has not been approved by the FDA to exclude DVT/VTE. Results should be correlated with clinical presentation.   Hepatic function panel     Status: Abnormal   Collection Time: 01/14/16 12:10 PM  Result Value Ref Range   Total Protein 7.2 6.5 - 8.1 g/dL   Albumin 3.3 (L) 3.5 - 5.0 g/dL   AST 25 15 - 41 U/L   ALT 21 14 - 54 U/L   Alkaline Phosphatase 49 38 - 126 U/L   Total Bilirubin 2.5 (H) 0.3 - 1.2 mg/dL   Bilirubin, Direct 0.6 (H) 0.1 - 0.5 mg/dL   Indirect Bilirubin 1.9 (H) 0.3 - 0.9 mg/dL  Lipase, blood     Status: None   Collection Time: 01/14/16  12:10 PM  Result Value Ref Range   Lipase 37 11 - 51 U/L  I-Stat Arterial Blood Gas, ED - (order at [facility] and [facility]  only)     Status: Abnormal   Collection Time: 01/14/16 12:24 PM  Result Value Ref Range   pH, Arterial 7.452 (H) 7.350 - 7.450   pCO2 arterial 32.0 (L) 35.0 - 45.0 mmHg   pO2, Arterial 69.0 (L) 80.0 - 100.0 mmHg   Bicarbonate 22.4 20.0 - 24.0 mEq/L   TCO2 23 0 - 100 mmol/L   O2 Saturation 95.0 %   Acid-base deficit 1.0 0.0 - 2.0 mmol/L   Patient temperature 98.2 F    Collection site RADIAL, ALLEN'S TEST ACCEPTABLE    Drawn by RT    Sample type ARTERIAL   Urinalysis, Routine w reflex microscopic (not at [facility])     Status: None   Collection Time: 01/14/16 12:50 PM  Result Value Ref Range   Color, Urine YELLOW YELLOW   APPearance CLEAR CLEAR   Specific Gravity, Urine 1.012 1.005 - 1.030   pH 6.0 5.0 - 8.0   Glucose, UA NEGATIVE NEGATIVE mg/dL   Hgb urine dipstick NEGATIVE NEGATIVE   Bilirubin Urine NEGATIVE NEGATIVE   Ketones, ur NEGATIVE NEGATIVE mg/dL   Protein, ur NEGATIVE NEGATIVE mg/dL   Nitrite NEGATIVE NEGATIVE   Leukocytes, UA NEGATIVE NEGATIVE    Comment: MICROSCOPIC NOT DONE ON URINES WITH NEGATIVE PROTEIN, BLOOD, LEUKOCYTES, NITRITE, OR GLUCOSE <1000 mg/dL.  Pregnancy, urine     Status: None   Collection Time: 01/14/16 12:50 PM  Result Value Ref Range   Preg Test, Ur NEGATIVE NEGATIVE    Comment:        THE SENSITIVITY OF THIS METHODOLOGY IS >20 mIU/mL.    Dg Chest 2 View  01/14/2016  CLINICAL DATA:  SOB and abdominal pain x 2 weeks. She states that today it was much worst. Hx of congestive heart failure, ex-smoker, asthma, obesity. EXAM: CHEST  2 VIEW COMPARISON:  None. FINDINGS: There is moderate to severe cardiac silhouette enlargement. There is mild diffuse interstitial prominence with mild azygos vein distension. No pleural effusion. IMPRESSION: Findings suggest congestive heart failure with mild interstitial edema. Electronically Signed   By: Skipper Cliche M.D.   On: 01/14/2016 12:17   Ct Angio Chest Pe W/cm &/or Wo Cm  01/14/2016  CLINICAL DATA:  Shortness of breath, epigastric abdominal pain for 2 weeks. EXAM: CT ANGIOGRAPHY CHEST CT ABDOMEN AND PELVIS WITH CONTRAST TECHNIQUE: Multidetector CT imaging of the chest was performed using the standard protocol during bolus administration of intravenous contrast. Multiplanar CT image reconstructions and MIPs were obtained to evaluate the vascular anatomy. Multidetector CT imaging of the abdomen and pelvis was performed using the standard protocol during bolus administration of intravenous contrast. CONTRAST:  100 mL of Isovue 370 intravenously. COMPARISON:  Chest radiograph of same day. FINDINGS: CTA CHEST FINDINGS No pneumothorax or pleural effusion is noted. No acute pulmonary disease is noted. Mild cardiomegaly is noted. There is no evidence of thoracic aortic aneurysm. There is no evidence of pulmonary embolus. No mediastinal mass or adenopathy is noted. No significant osseous abnormality is noted in the chest. CT ABDOMEN and PELVIS FINDINGS No significant osseous abnormality is noted. Fatty infiltration of the liver is noted. The spleen and pancreas are unremarkable. Adrenal glands and kidneys are unremarkable. No hydronephrosis or renal obstruction is noted. Mild gallbladder wall thickening is noted with probable mild pericholecystic fluid concerning for cholecystitis. No gallstones are noted. Small amount of fluid is noted around the lateral portion of the right hepatic lobe. There is no evidence of bowel obstruction. The appendix appears normal. Urinary  bladder appears normal. Uterus and ovaries appear unremarkable. Mild amount of free fluid is noted in the right lower quadrant of unknown etiology. No significant adenopathy is noted. Review of the MIP images confirms the above findings. IMPRESSION: No evidence of pulmonary embolus. Mild cardiomegaly is noted. No acute abnormality seen in the chest. Fatty  infiltration of the liver. Mild gallbladder wall thickening is noted with probable pericholecystic fluid concerning for acute cholecystitis. No gallstones are noted. HIDA scan may be performed further evaluation. Mild amount of free fluid is noted in the right lower quadrant of unknown etiology. Possible ruptured ovarian cyst cannot be excluded. Pelvic ultrasound may be performed for further evaluation. The appendix appears normal. Electronically Signed   By: Marijo Conception, M.D.   On: 01/14/2016 15:38   Ct Abdomen Pelvis W Contrast  01/14/2016  CLINICAL DATA:  Shortness of breath, epigastric abdominal pain for 2 weeks. EXAM: CT ANGIOGRAPHY CHEST CT ABDOMEN AND PELVIS WITH CONTRAST TECHNIQUE: Multidetector CT imaging of the chest was performed using the standard protocol during bolus administration of intravenous contrast. Multiplanar CT image reconstructions and MIPs were obtained to evaluate the vascular anatomy. Multidetector CT imaging of the abdomen and pelvis was performed using the standard protocol during bolus administration of intravenous contrast. CONTRAST:  100 mL of Isovue 370 intravenously. COMPARISON:  Chest radiograph of same day. FINDINGS: CTA CHEST FINDINGS No pneumothorax or pleural effusion is noted. No acute pulmonary disease is noted. Mild cardiomegaly is noted. There is no evidence of thoracic aortic aneurysm. There is no evidence of pulmonary embolus. No mediastinal mass or adenopathy is noted. No significant osseous abnormality is noted in the chest. CT ABDOMEN and PELVIS FINDINGS No significant osseous abnormality is noted. Fatty infiltration of the liver is noted. The spleen and pancreas are unremarkable. Adrenal glands and kidneys are unremarkable. No hydronephrosis or renal obstruction is noted. Mild gallbladder wall thickening is noted with probable mild pericholecystic fluid concerning for cholecystitis. No gallstones are noted. Small amount of fluid is noted around the lateral  portion of the right hepatic lobe. There is no evidence of bowel obstruction. The appendix appears normal. Urinary bladder appears normal. Uterus and ovaries appear unremarkable. Mild amount of free fluid is noted in the right lower quadrant of unknown etiology. No significant adenopathy is noted. Review of the MIP images confirms the above findings. IMPRESSION: No evidence of pulmonary embolus. Mild cardiomegaly is noted. No acute abnormality seen in the chest. Fatty infiltration of the liver. Mild gallbladder wall thickening is noted with probable pericholecystic fluid concerning for acute cholecystitis. No gallstones are noted. HIDA scan may be performed further evaluation. Mild amount of free fluid is noted in the right lower quadrant of unknown etiology. Possible ruptured ovarian cyst cannot be excluded. Pelvic ultrasound may be performed for further evaluation. The appendix appears normal. Electronically Signed   By: Marijo Conception, M.D.   On: 01/14/2016 15:38    VITALS: Filed Vitals:   01/14/16 1330 01/14/16 1345  BP: 119/76 116/79  Pulse: 112 110  Temp:    Resp: 18 20   EKG: Sinus tachycardia at 124, inferior and anterolateral T-wave inversions  EXAM: General appearance: alert, no distress and morbidly obese Neck: JVD - 3 cm above sternal notch and no carotid bruit Lungs: diminished breath sounds bilaterally Heart: regular rate and rhythm Abdomen: morbidly obese, mild tenderness Extremities: extremities normal, atraumatic, no cyanosis or edema Pulses: 2+ and symmetric Skin: Skin color, texture, turgor normal. No rashes or lesions  Neurologic: Grossly normal Psych: Pleasant  IMPRESSION: Principal Problem:   Acute systolic (congestive) heart failure (HCC) Active Problems:   Morbid obesity ([place])   Abdominal pain   Peripartum cardiomyopathy   At risk for sleep apnea   PLAN: 1. Acute systolic heart failure- etiology is unclear. She says she takes her medications, but she seems  to have little understanding of them. She tells me she's had a diagnosis of cardiomyopathy after the birth of her son in 2010 and was seen at [facility] in [place]. She never had a heart catheterization. She had declined a recent heart catheterization in [place]. She denies any salty foods and says she "eats healthy" and does not know why she is "fat". She has responded to IV Lasix. We'll continue diuresis and she'll need medicine optimization plus or minus right heart catheterization if she is agreeable during this hospitalization. We will repeat an echocardiogram. If she's had worsening LV function, advanced heart failure consultation may be helpful. 2. Nonrestorative sleep/snoring/witnessed apnea- concern for either central or obstructive sleep apnea. This will need to be studied in the near future. 3. Social needs- while she has Medicaid, she says she has no transportation and difficulty getting to her doctors in [place]. She's had difficulty with her medications. She has limited understanding of heart failure and does not watch daily weights or particularly practice dietary restriction. She will need education and close follow-up during this hospitalization and afterwards. 4. Abdominal pain- there is some evidence of swelling of the gallbladder. This could be related to heart failure. We will obtain an ultrasound and may need to consider surgical consultation if there is some suggestion of cholecystitis as a cause of her pain.  Full Code.  Pixie Casino, MD, [facility] Attending Cardiologist [place] [facility] Vivan Vanderveer 01/14/2016, 4:13 PM

## 2016-01-14 NOTE — ED Notes (Signed)
One attempt to call report  Unable to take  Will call back

## 2016-01-15 ENCOUNTER — Inpatient Hospital Stay (HOSPITAL_COMMUNITY): Payer: Medicaid Other

## 2016-01-15 DIAGNOSIS — R7989 Other specified abnormal findings of blood chemistry: Secondary | ICD-10-CM | POA: Diagnosis present

## 2016-01-15 DIAGNOSIS — R9431 Abnormal electrocardiogram [ECG] [EKG]: Secondary | ICD-10-CM | POA: Diagnosis present

## 2016-01-15 DIAGNOSIS — I509 Heart failure, unspecified: Secondary | ICD-10-CM

## 2016-01-15 LAB — BASIC METABOLIC PANEL
Anion gap: 9 (ref 5–15)
BUN: 15 mg/dL (ref 6–20)
CHLORIDE: 102 mmol/L (ref 101–111)
CO2: 29 mmol/L (ref 22–32)
Calcium: 8.9 mg/dL (ref 8.9–10.3)
Creatinine, Ser: 1.01 mg/dL — ABNORMAL HIGH (ref 0.44–1.00)
GFR calc non Af Amer: >60 mL/min (ref >60)
Glucose, Bld: 84 mg/dL (ref 65–99)
POTASSIUM: 3.8 mmol/L (ref 3.5–5.1)
SODIUM: 140 mmol/L (ref 135–145)

## 2016-01-15 LAB — ECHOCARDIOGRAM COMPLETE
Height: 64 in
Weight: 5304 oz

## 2016-01-15 LAB — BRAIN NATRIURETIC PEPTIDE: B NATRIURETIC PEPTIDE 5: 578.7 pg/mL — AB (ref 0.0–100.0)

## 2016-01-15 MED ORDER — LOSARTAN POTASSIUM 25 MG PO TABS
25.0000 mg | ORAL_TABLET | Freq: Every day | ORAL | Status: DC
  Administered 2016-01-15 – 2016-01-16 (×2): 25 mg via ORAL
  Filled 2016-01-15 (×2): qty 1

## 2016-01-15 NOTE — Care Management Note (Deleted)
Case Management Note  Patient Details  Name: Kirsten Wheeler MRN: [id number] Date of Birth: 28-Feb-1981  Subjective/Objective:       Admitted with CHF             Action/Plan: Patient lives at home with her 2 children ( age 33 and 47 yrs old); she states that she lives in public housing and receives child support. Her Father who lives in [place] sends her money often. Patient has private insurance with Medicaid/ prescription drug coverage,. She has transportation through [employer] in [place].  She states that she eats healthy lots of salads and walk a lot. CM encourage patient to continue to walk with her physicians approval. CM informed patient of the high sodium content in foods in restaurants ; She states that she eats things that she should not eat with the children, CM encouraged pt to do the best that she can to stay on tract. No vehicle. He sister lives in [place] and assist her when she can. Patient does not have scales at home. CM talked to patient about the importance of weighing herself daily to monitor her weight gain.  Overall, pt has... Medicaid with prescription drug coverage Transportation through [employer] in / [facility] in public housing at no cost Receives child support Very supportive Parent/ father Lots of encouragement given to patient to stay on the right track, follow up with her physicians after hospitalization. Formulate a budget for the co pay for her medication ( cost is $3.00 or less).  Expected Discharge Date:    possibly 01/18/2016 Expected Discharge Plan:  Home/Self Care  In-House Referral:   Nutrition / to help make better food choices  Discharge planning Services  CM Consult  Choice offered to:  Patient  Status of Service:  In process, will continue to follow  Reola Mosher [phone] 01/15/2016, 10:52 AM

## 2016-01-15 NOTE — Clinical Social Work Note (Addendum)
CSW was consulted regarding transportation. Patient has Medicaid and is already signed up with [employer] Transportation, however, she reports that she is not able to schedule doctor's appointments in advance when needed. Medicaid Transportation needs 48 hour notice prior to the appointment date. CSW researched other modes of transportation and found the [employer] bus that goes from [place] to [place] and back. This is not effective for patient because she may be waiting hours of transportation to pick her back up. CSW was not able to locate any other transportation resources for that area. Patient reports that [facility] does not have [place] [place] or taxi services.  CSW encouraged patient to contact CSW as needed. CSW signing off.  Charlynn Court, CSW [phone]

## 2016-01-15 NOTE — Progress Notes (Addendum)
Subjective:  SOB improved since admission  Objective:  Vital Signs in the last 24 hours: Temp:  [97.7 F (36.5 C)-98.2 F (36.8 C)] 97.7 F (36.5 C) (05/01 0628) Pulse Rate:  [89-130] 89 (05/01 0628) Resp:  [18-24] 18 (05/01 0628) BP: (100-156)/(59-106) 143/86 mmHg (05/01 0628) SpO2:  [95 %-100 %] 95 % (05/01 0628) Weight:  [331 lb 8 oz (150.367 kg)-332 lb 0.2 oz (150.6 kg)] 331 lb 8 oz (150.367 kg) (05/01 0628)  Intake/Output from previous day:  Intake/Output Summary (Last 24 hours) at 01/15/16 0924 Last data filed at 01/15/16 0600  Gross per 24 hour  Intake    480 ml  Output   4450 ml  Net  -3970 ml    Physical Exam: General appearance: alert, cooperative, no distress and morbidly obese Lungs: clear to auscultation bilaterally Heart: regular rate and rhythm Extremities: no edema Skin: Skin color, texture, turgor normal. No rashes or lesions Neurologic: Grossly normal   Rate: 88  Rhythm: normal sinus rhythm  Lab Results:  Recent Labs  01/14/16 1115  WBC 7.1  HGB 12.8  PLT 303    Recent Labs  01/14/16 1115 01/15/16 0239  NA 138 140  K 4.6 3.8  CL 105 102  CO2 25 29  GLUCOSE 95 84  BUN 16 15  CREATININE 1.06* 1.01*    Recent Labs  01/14/16 1210  TROPONINI <0.03   No results for input(s): INR in the last 72 hours.  Scheduled Meds: . aspirin EC  81 mg Oral Daily  . carvedilol  6.25 mg Oral BID WC  . furosemide  40 mg Intravenous BID  . heparin  5,000 Units Subcutaneous Q8H  . sodium chloride flush  3 mL Intravenous Q12H   Continuous Infusions:  PRN Meds:.sodium chloride, acetaminophen, albuterol, ondansetron (ZOFRAN) IV, sodium chloride flush   Imaging: Dg Chest 2 View  01/14/2016  CLINICAL DATA:  SOB and abdominal pain x 2 weeks. She states that today it was much worst. Hx of congestive heart failure, ex-smoker, asthma, obesity. EXAM: CHEST  2 VIEW COMPARISON:  None. FINDINGS: There is moderate to severe cardiac silhouette  enlargement. There is mild diffuse interstitial prominence with mild azygos vein distension. No pleural effusion. IMPRESSION: Findings suggest congestive heart failure with mild interstitial edema. Electronically Signed   By: Esperanza Heir M.D.   On: 01/14/2016 12:17   Ct Angio Chest Pe W/cm &/or Wo Cm  01/14/2016  CLINICAL DATA:  Shortness of breath, epigastric abdominal pain for 2 weeks. EXAM: CT ANGIOGRAPHY CHEST CT ABDOMEN AND PELVIS WITH CONTRAST TECHNIQUE: Multidetector CT imaging of the chest was performed using the standard protocol during bolus administration of intravenous contrast. Multiplanar CT image reconstructions and MIPs were obtained to evaluate the vascular anatomy. Multidetector CT imaging of the abdomen and pelvis was performed using the standard protocol during bolus administration of intravenous contrast. CONTRAST:  100 mL of Isovue 370 intravenously. COMPARISON:  Chest radiograph of same day. FINDINGS: CTA CHEST FINDINGS No pneumothorax or pleural effusion is noted. No acute pulmonary disease is noted. Mild cardiomegaly is noted. There is no evidence of thoracic aortic aneurysm. There is no evidence of pulmonary embolus. No mediastinal mass or adenopathy is noted. No significant osseous abnormality is noted in the chest. CT ABDOMEN and PELVIS FINDINGS No significant osseous abnormality is noted. Fatty infiltration of the liver is noted. The spleen and pancreas are unremarkable. Adrenal glands and kidneys are unremarkable. No hydronephrosis or renal obstruction is noted. Mild gallbladder  wall thickening is noted with probable mild pericholecystic fluid concerning for cholecystitis. No gallstones are noted. Small amount of fluid is noted around the lateral portion of the right hepatic lobe. There is no evidence of bowel obstruction. The appendix appears normal. Urinary bladder appears normal. Uterus and ovaries appear unremarkable. Mild amount of free fluid is noted in the right lower  quadrant of unknown etiology. No significant adenopathy is noted. Review of the MIP images confirms the above findings. IMPRESSION: No evidence of pulmonary embolus. Mild cardiomegaly is noted. No acute abnormality seen in the chest. Fatty infiltration of the liver. Mild gallbladder wall thickening is noted with probable pericholecystic fluid concerning for acute cholecystitis. No gallstones are noted. HIDA scan may be performed further evaluation. Mild amount of free fluid is noted in the right lower quadrant of unknown etiology. Possible ruptured ovarian cyst cannot be excluded. Pelvic ultrasound may be performed for further evaluation. The appendix appears normal. Electronically Signed   By: Lupita Raider, M.D.   On: 01/14/2016 15:38   Ct Abdomen Pelvis W Contrast  01/14/2016  CLINICAL DATA:  Shortness of breath, epigastric abdominal pain for 2 weeks. EXAM: CT ANGIOGRAPHY CHEST CT ABDOMEN AND PELVIS WITH CONTRAST IMPRESSION: No evidence of pulmonary embolus. Mild cardiomegaly is noted. No acute abnormality seen in the chest. Fatty infiltration of the liver. Mild gallbladder wall thickening is noted with probable pericholecystic fluid concerning for acute cholecystitis. No gallstones are noted. HIDA scan may be performed further evaluation. Mild amount of free fluid is noted in the right lower quadrant of unknown etiology. Possible ruptured ovarian cyst cannot be excluded. Pelvic ultrasound may be performed for further evaluation. The appendix appears normal. Electronically Signed   By: Lupita Raider, M.D.   On: 01/14/2016 15:38   US Abdomen Limited Ruq  01/14/2016  CLINICAL DATA:  35 year old female with history of right upper quadrant abdominal pain for the past 3 weeks. EXAM: US ABDOMEN LIMITED - RIGHT UPPER QUADRANT COMPARISON:  No priors. FINDINGS: Gallbladder: No gallstones or wall thickening visualized. No sonographic Murphy sign noted by sonographer. Common bile duct: Diameter: 3.4 mm Liver: No  focal lesion identified. Within normal limits in parenchymal echogenicity. IMPRESSION: 1. No acute findings. Specifically, no gallstones or findings to suggest an acute cholecystitis at this time. Electronically Signed   By: Trudie Reed M.D.   On: 01/14/2016 17:18    Cardiac Studies: Echo 01/15/16- done, results pending  Assessment/Plan:  35 y.o. 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 She is followed in [place]. She has refused follow up echo and has declined coronary angiograms previously. She presented 01/14/16 with CHF. She has diuresed 3.9L. She had complained of abdominal pain and was seen by the Surgical service in consult. It's felt her abd pain was most likely secondary to fluid overload.    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 Continue IV Lasix. Coreg ordered on adm. Diovan listed as a home medication - will resume after echo results known and pt has diuresed adequately (she may need cath pending echo).   Corine Shelter PA-C 01/15/2016, 9:24 AM  [phone]  Patient seen, examined. Available data reviewed. Agree with findings, assessment, and plan as outlined by Corine Shelter, PA-C. Exam reveals pleasant, morbidly obese woman in no distress. Lungs are clear. Heart is regular rate and rhythm. JVP is elevated. There is 1+ pretibial edema. Await echo result. Continue IV diuresis as above. Will need ongoing heart failure education as outlined. I do not anticipate her needing evaluation for ischemic heart disease considering her age of 35 years old unless we see segmental LV dysfunction suggestive of CAD. Add losartan 25 mg  today.  Tonny Bollman, M.D. 01/15/2016 11:30 AM

## 2016-01-15 NOTE — Progress Notes (Signed)
  Echocardiogram 2D Echocardiogram has been performed.  Cathie Beams 01/15/2016, 8:41 AM

## 2016-01-15 NOTE — Care Management Note (Signed)
Case Management Note  Patient Details  Name: Kirsten Wheeler MRN: [id number] Date of Birth: 1980/12/22  Subjective/Objective: Admitted with CHF    Action/Plan: Patient lives at home with her 2 children ( age 33 and 30 yrs old); she states that she lives in public housing and receives child support. Her Father who lives in [place] sends her money often. Patient has private insurance with Medicaid/ prescription drug coverage,. She has transportation through [facility] in [place].  She states that she eats healthy lots of salads and walk a lot. CM encourage patient to continue to walk with her physicians approval. CM informed patient of the high sodium content in foods in restaurants ; She states that she eats things that she should not eat with the children, CM encouraged pt to do the best that she can to stay on tract. No vehicle. He sister lives in [place] and assist her when she can. Patient does not have scales at home. CM talked to patient about the importance of weighing herself daily to monitor her weight gain.  Overall, pt has... Medicaid with prescription drug coverage Transportation through [facility] in [place]/ [facility] Very supportive Parent/ father Lots of encouragement given to patient to stay on the right track, follow up with her physicians after hospitalization. Formulate a budget for the co pay for her medication ( cost is $3.00 or less).   Expected Discharge Date:   possibly 01/18/2016               Expected Discharge Plan:  Home/Self Care  Discharge planning Services  CM Consult  Choice offered to:  Patient  Status of Service:  In process, will continue to follow  Reola Mosher [phone] 01/15/2016, 2:33 PM

## 2016-01-15 NOTE — Progress Notes (Signed)
Subjective: She reports that she is still having intermittent epigastric pain  Objective: Vital signs in last 24 hours: Temp:  [97.7 F (36.5 C)-98.2 F (36.8 C)] 97.7 F (36.5 C) (05/01 0628) Pulse Rate:  [89-130] 89 (05/01 0628) Resp:  [18-24] 18 (05/01 0628) BP: (100-156)/(59-106) 143/86 mmHg (05/01 0628) SpO2:  [95 %-100 %] 95 % (05/01 0628) Weight:  [150.367 kg (331 lb 8 oz)-150.6 kg (332 lb 0.2 oz)] 150.367 kg (331 lb 8 oz) (05/01 0628) Last BM Date: 01/13/16  Intake/Output from previous day: 04/30 0701 - 05/01 0700 In: 480 [P.O.:480] Out: 4450 [Urine:4450] Intake/Output this shift:    Abdomen does have mild epigastric tenderness with guarding  Lab Results:   Recent Labs  01/14/16 1115  WBC 7.1  HGB 12.8  HCT 37.9  PLT 303   BMET  Recent Labs  01/14/16 1115 01/15/16 0239  NA 138 140  K 4.6 3.8  CL 105 102  CO2 25 29  GLUCOSE 95 84  BUN 16 15  CREATININE 1.06* 1.01*  CALCIUM 9.1 8.9   PT/INR No results for input(s): LABPROT, INR in the last 72 hours. ABG  Recent Labs  01/14/16 1224  PHART 7.452*  HCO3 22.4    Studies/Results: Dg Chest 2 View  01/14/2016  CLINICAL DATA:  SOB and abdominal pain x 2 weeks. She states that today it was much worst. Hx of congestive heart failure, ex-smoker, asthma, obesity. EXAM: CHEST  2 VIEW COMPARISON:  None. FINDINGS: There is moderate to severe cardiac silhouette enlargement. There is mild diffuse interstitial prominence with mild azygos vein distension. No pleural effusion. IMPRESSION: Findings suggest congestive heart failure with mild interstitial edema. Electronically Signed   By: Esperanza Heir M.D.   On: 01/14/2016 12:17   Ct Angio Chest Pe W/cm &/or Wo Cm  01/14/2016  CLINICAL DATA:  Shortness of breath, epigastric abdominal pain for 2 weeks. EXAM: CT ANGIOGRAPHY CHEST CT ABDOMEN AND PELVIS WITH CONTRAST TECHNIQUE: Multidetector CT imaging of the chest was performed using the standard protocol during  bolus administration of intravenous contrast. Multiplanar CT image reconstructions and MIPs were obtained to evaluate the vascular anatomy. Multidetector CT imaging of the abdomen and pelvis was performed using the standard protocol during bolus administration of intravenous contrast. CONTRAST:  100 mL of Isovue 370 intravenously. COMPARISON:  Chest radiograph of same day. FINDINGS: CTA CHEST FINDINGS No pneumothorax or pleural effusion is noted. No acute pulmonary disease is noted. Mild cardiomegaly is noted. There is no evidence of thoracic aortic aneurysm. There is no evidence of pulmonary embolus. No mediastinal mass or adenopathy is noted. No significant osseous abnormality is noted in the chest. CT ABDOMEN and PELVIS FINDINGS No significant osseous abnormality is noted. Fatty infiltration of the liver is noted. The spleen and pancreas are unremarkable. Adrenal glands and kidneys are unremarkable. No hydronephrosis or renal obstruction is noted. Mild gallbladder wall thickening is noted with probable mild pericholecystic fluid concerning for cholecystitis. No gallstones are noted. Small amount of fluid is noted around the lateral portion of the right hepatic lobe. There is no evidence of bowel obstruction. The appendix appears normal. Urinary bladder appears normal. Uterus and ovaries appear unremarkable. Mild amount of free fluid is noted in the right lower quadrant of unknown etiology. No significant adenopathy is noted. Review of the MIP images confirms the above findings. IMPRESSION: No evidence of pulmonary embolus. Mild cardiomegaly is noted. No acute abnormality seen in the chest. Fatty infiltration of the liver. Mild gallbladder wall  thickening is noted with probable pericholecystic fluid concerning for acute cholecystitis. No gallstones are noted. HIDA scan may be performed further evaluation. Mild amount of free fluid is noted in the right lower quadrant of unknown etiology. Possible ruptured ovarian  cyst cannot be excluded. Pelvic ultrasound may be performed for further evaluation. The appendix appears normal. Electronically Signed   By: Lupita Raider, M.D.   On: 01/14/2016 15:38   Ct Abdomen Pelvis W Contrast  01/14/2016  CLINICAL DATA:  Shortness of breath, epigastric abdominal pain for 2 weeks. EXAM: CT ANGIOGRAPHY CHEST CT ABDOMEN AND PELVIS WITH CONTRAST TECHNIQUE: Multidetector CT imaging of the chest was performed using the standard protocol during bolus administration of intravenous contrast. Multiplanar CT image reconstructions and MIPs were obtained to evaluate the vascular anatomy. Multidetector CT imaging of the abdomen and pelvis was performed using the standard protocol during bolus administration of intravenous contrast. CONTRAST:  100 mL of Isovue 370 intravenously. COMPARISON:  Chest radiograph of same day. FINDINGS: CTA CHEST FINDINGS No pneumothorax or pleural effusion is noted. No acute pulmonary disease is noted. Mild cardiomegaly is noted. There is no evidence of thoracic aortic aneurysm. There is no evidence of pulmonary embolus. No mediastinal mass or adenopathy is noted. No significant osseous abnormality is noted in the chest. CT ABDOMEN and PELVIS FINDINGS No significant osseous abnormality is noted. Fatty infiltration of the liver is noted. The spleen and pancreas are unremarkable. Adrenal glands and kidneys are unremarkable. No hydronephrosis or renal obstruction is noted. Mild gallbladder wall thickening is noted with probable mild pericholecystic fluid concerning for cholecystitis. No gallstones are noted. Small amount of fluid is noted around the lateral portion of the right hepatic lobe. There is no evidence of bowel obstruction. The appendix appears normal. Urinary bladder appears normal. Uterus and ovaries appear unremarkable. Mild amount of free fluid is noted in the right lower quadrant of unknown etiology. No significant adenopathy is noted. Review of the MIP images  confirms the above findings. IMPRESSION: No evidence of pulmonary embolus. Mild cardiomegaly is noted. No acute abnormality seen in the chest. Fatty infiltration of the liver. Mild gallbladder wall thickening is noted with probable pericholecystic fluid concerning for acute cholecystitis. No gallstones are noted. HIDA scan may be performed further evaluation. Mild amount of free fluid is noted in the right lower quadrant of unknown etiology. Possible ruptured ovarian cyst cannot be excluded. Pelvic ultrasound may be performed for further evaluation. The appendix appears normal. Electronically Signed   By: Lupita Raider, M.D.   On: 01/14/2016 15:38   US Abdomen Limited Ruq  01/14/2016  CLINICAL DATA:  35 year old female with history of right upper quadrant abdominal pain for the past 3 weeks. EXAM: US ABDOMEN LIMITED - RIGHT UPPER QUADRANT COMPARISON:  No priors. FINDINGS: Gallbladder: No gallstones or wall thickening visualized. No sonographic Murphy sign noted by sonographer. Common bile duct: Diameter: 3.4 mm Liver: No focal lesion identified. Within normal limits in parenchymal echogenicity. IMPRESSION: 1. No acute findings. Specifically, no gallstones or findings to suggest an acute cholecystitis at this time. Electronically Signed   By: Trudie Reed M.D.   On: 01/14/2016 17:18    Anti-infectives: Anti-infectives    None      Assessment/Plan: s/p * No surgery found *  Abdominal pain, possible biliary colic  Again U/S negative for thickening of gallbladder wall or stones.  Given pain, will still order a HIDA scan to evaluate for cholecystitis.  LOS: 1 day  Honest Safranek A 01/15/2016

## 2016-01-16 ENCOUNTER — Encounter (HOSPITAL_COMMUNITY): Payer: Self-pay | Admitting: General Practice

## 2016-01-16 ENCOUNTER — Inpatient Hospital Stay (HOSPITAL_COMMUNITY): Payer: Medicaid Other

## 2016-01-16 LAB — BASIC METABOLIC PANEL
Anion gap: 9 (ref 5–15)
BUN: 16 mg/dL (ref 6–20)
CHLORIDE: 100 mmol/L — AB (ref 101–111)
CO2: 29 mmol/L (ref 22–32)
CREATININE: 1.1 mg/dL — AB (ref 0.44–1.00)
Calcium: 9 mg/dL (ref 8.9–10.3)
GFR calc non Af Amer: >60 mL/min (ref >60)
Glucose, Bld: 83 mg/dL (ref 65–99)
Potassium: 4.1 mmol/L (ref 3.5–5.1)
Sodium: 138 mmol/L (ref 135–145)

## 2016-01-16 MED ORDER — TECHNETIUM TC 99M MEBROFENIN IV KIT
5.2000 | PACK | Freq: Once | INTRAVENOUS | Status: AC | PRN
  Administered 2016-01-16: 5 via INTRAVENOUS

## 2016-01-16 MED ORDER — SACUBITRIL-VALSARTAN 24-26 MG PO TABS
1.0000 | ORAL_TABLET | Freq: Two times a day (BID) | ORAL | Status: DC
  Administered 2016-01-16 – 2016-01-17 (×2): 1 via ORAL
  Filled 2016-01-16 (×4): qty 1

## 2016-01-16 NOTE — Progress Notes (Signed)
Advanced Heart Failure Rounding Note   Subjective:    Admitted with increased dyspnea. 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 Never had ischemic work up. She has been diuresing with IV lasix. Started on losartan.   Overall feeling much better. Denies SOB.   Objective:   Weight Range:  Vital Signs:   Temp:  [97.5 F (36.4 C)-97.8 F (36.6 C)] 97.8 F (36.6 C) (05/02 0623) Pulse Rate:  [84-91] 84 (05/02 0623) Resp:  [18-20] 18 (05/02 0623) BP: (107-134)/(68-85) 108/68 mmHg (05/02 0623) SpO2:  [98 %-99 %] 99 % (05/02 0623) Weight:  [331 lb 8 oz (150.367 kg)] 331 lb 8 oz (150.367 kg) (05/02 0623) Last BM Date: 01/13/16  Weight change: Filed Weights   01/14/16 1740 01/15/16 0628 01/16/16 0623  Weight: 332 lb 0.2 oz (150.6 kg) 331 lb 8 oz (150.367 kg) 331 lb 8 oz (150.367 kg)    Intake/Output:   Intake/Output Summary (Last 24 hours) at 01/16/16 1117 Last data filed at 01/16/16 0700  Gross per 24 hour  Intake    560 ml  Output   2825 ml  Net  -2265 ml     Physical Exam: General:  Well appearing. No resp difficulty. In bed.  HEENT: normal Neck: supple. JVP 10 cm. Carotids 2+ bilat; no bruits. No lymphadenopathy or thyromegaly appreciated. Cor: PMI nondisplaced. Regular rate & rhythm. No gallop, 1/6 HSM apex.  Lungs: clear Abdomen: Obese, soft, nontender, nondistended. No hepatosplenomegaly. No bruits or masses. Good bowel sounds. Extremities: no cyanosis, clubbing, rash, R and LLE trace edema Neuro: alert & orientedx3, cranial nerves grossly intact. moves all 4 extremities w/o difficulty. Affect pleasant  Telemetry: NSR 90s   Labs: Basic Metabolic Panel:  Recent Labs Lab 01/14/16 1115 01/15/16 0239 01/16/16 0228  NA 138 140 138  K 4.6 3.8 4.1  CL 105 102 100*  CO2 25 29 29   GLUCOSE 95 84 83  BUN 16 15 16   CREATININE 1.06* 1.01* 1.10*  CALCIUM 9.1 8.9 9.0    Liver Function Tests:  Recent Labs Lab  01/14/16 1210  AST 25  ALT 21  ALKPHOS 49  BILITOT 2.5*  PROT 7.2  ALBUMIN 3.3*    Recent Labs Lab 01/14/16 1210  LIPASE 37   No results for input(s): AMMONIA in the last 168 hours.  CBC:  Recent Labs Lab 01/14/16 1115  WBC 7.1  HGB 12.8  HCT 37.9  MCV 76.4*  PLT 303    Cardiac Enzymes:  Recent Labs Lab 01/14/16 1210  TROPONINI <0.03    BNP: BNP (last 3 results)  Recent Labs  01/14/16 1210 01/15/16 0239  BNP 647.9* 578.7*    ProBNP (last 3 results) No results for input(s): PROBNP in the last 8760 hours.    Other results:  Imaging: Dg Chest 2 View  01/14/2016  CLINICAL DATA:  SOB and abdominal pain x 2 weeks. She states that today it was much worst. Hx of congestive heart failure, ex-smoker, asthma, obesity. EXAM: CHEST  2 VIEW COMPARISON:  None. FINDINGS: There is moderate to severe cardiac silhouette enlargement. There is mild diffuse interstitial prominence with mild azygos vein distension. No pleural effusion. IMPRESSION: Findings suggest congestive heart failure with mild interstitial edema. Electronically Signed   By: Esperanza Heir M.D.   On: 01/14/2016 12:17   Ct Angio Chest Pe W/cm &/or Wo Cm  01/14/2016  CLINICAL DATA:  Shortness of breath, epigastric abdominal pain for 2 weeks. EXAM:  CT ANGIOGRAPHY CHEST CT ABDOMEN AND PELVIS WITH CONTRAST TECHNIQUE: Multidetector CT imaging of the chest was performed using the standard protocol during bolus administration of intravenous contrast. Multiplanar CT image reconstructions and MIPs were obtained to evaluate the vascular anatomy. Multidetector CT imaging of the abdomen and pelvis was performed using the standard protocol during bolus administration of intravenous contrast. CONTRAST:  100 mL of Isovue 370 intravenously. COMPARISON:  Chest radiograph of same day. FINDINGS: CTA CHEST FINDINGS No pneumothorax or pleural effusion is noted. No acute pulmonary disease is noted. Mild cardiomegaly is noted. There  is no evidence of thoracic aortic aneurysm. There is no evidence of pulmonary embolus. No mediastinal mass or adenopathy is noted. No significant osseous abnormality is noted in the chest. CT ABDOMEN and PELVIS FINDINGS No significant osseous abnormality is noted. Fatty infiltration of the liver is noted. The spleen and pancreas are unremarkable. Adrenal glands and kidneys are unremarkable. No hydronephrosis or renal obstruction is noted. Mild gallbladder wall thickening is noted with probable mild pericholecystic fluid concerning for cholecystitis. No gallstones are noted. Small amount of fluid is noted around the lateral portion of the right hepatic lobe. There is no evidence of bowel obstruction. The appendix appears normal. Urinary bladder appears normal. Uterus and ovaries appear unremarkable. Mild amount of free fluid is noted in the right lower quadrant of unknown etiology. No significant adenopathy is noted. Review of the MIP images confirms the above findings. IMPRESSION: No evidence of pulmonary embolus. Mild cardiomegaly is noted. No acute abnormality seen in the chest. Fatty infiltration of the liver. Mild gallbladder wall thickening is noted with probable pericholecystic fluid concerning for acute cholecystitis. No gallstones are noted. HIDA scan may be performed further evaluation. Mild amount of free fluid is noted in the right lower quadrant of unknown etiology. Possible ruptured ovarian cyst cannot be excluded. Pelvic ultrasound may be performed for further evaluation. The appendix appears normal. Electronically Signed   By: Lupita Raider, M.D.   On: 01/14/2016 15:38   Nm Hepatobiliary Liver Func  01/16/2016  CLINICAL DATA:  Right upper quadrant abdominal pain. EXAM: NUCLEAR MEDICINE HEPATOBILIARY IMAGING TECHNIQUE: Sequential images of the abdomen were obtained out to 60 minutes following intravenous administration of radiopharmaceutical. RADIOPHARMACEUTICALS:  5.0 mCi Tc-74m  Choletec IV  COMPARISON:  Ultrasound and CT scan of January 14, 2016. FINDINGS: Prompt uptake and biliary excretion of activity by the liver is seen. Gallbladder activity is visualized, consistent with patency of cystic duct. Biliary activity passes into small bowel, consistent with patent common bile duct. IMPRESSION: Normal and timely filling of gallbladder is noted. There is no scintigraphic evidence of cystic duct obstruction or cholecystitis. Electronically Signed   By: Lupita Raider, M.D.   On: 01/16/2016 09:19   Ct Abdomen Pelvis W Contrast  01/14/2016  CLINICAL DATA:  Shortness of breath, epigastric abdominal pain for 2 weeks. EXAM: CT ANGIOGRAPHY CHEST CT ABDOMEN AND PELVIS WITH CONTRAST TECHNIQUE: Multidetector CT imaging of the chest was performed using the standard protocol during bolus administration of intravenous contrast. Multiplanar CT image reconstructions and MIPs were obtained to evaluate the vascular anatomy. Multidetector CT imaging of the abdomen and pelvis was performed using the standard protocol during bolus administration of intravenous contrast. CONTRAST:  100 mL of Isovue 370 intravenously. COMPARISON:  Chest radiograph of same day. FINDINGS: CTA CHEST FINDINGS No pneumothorax or pleural effusion is noted. No acute pulmonary disease is noted. Mild cardiomegaly is noted. There is no evidence of thoracic aortic aneurysm.  There is no evidence of pulmonary embolus. No mediastinal mass or adenopathy is noted. No significant osseous abnormality is noted in the chest. CT ABDOMEN and PELVIS FINDINGS No significant osseous abnormality is noted. Fatty infiltration of the liver is noted. The spleen and pancreas are unremarkable. Adrenal glands and kidneys are unremarkable. No hydronephrosis or renal obstruction is noted. Mild gallbladder wall thickening is noted with probable mild pericholecystic fluid concerning for cholecystitis. No gallstones are noted. Small amount of fluid is noted around the lateral  portion of the right hepatic lobe. There is no evidence of bowel obstruction. The appendix appears normal. Urinary bladder appears normal. Uterus and ovaries appear unremarkable. Mild amount of free fluid is noted in the right lower quadrant of unknown etiology. No significant adenopathy is noted. Review of the MIP images confirms the above findings. IMPRESSION: No evidence of pulmonary embolus. Mild cardiomegaly is noted. No acute abnormality seen in the chest. Fatty infiltration of the liver. Mild gallbladder wall thickening is noted with probable pericholecystic fluid concerning for acute cholecystitis. No gallstones are noted. HIDA scan may be performed further evaluation. Mild amount of free fluid is noted in the right lower quadrant of unknown etiology. Possible ruptured ovarian cyst cannot be excluded. Pelvic ultrasound may be performed for further evaluation. The appendix appears normal. Electronically Signed   By: Lupita Raider, M.D.   On: 01/14/2016 15:38   US Abdomen Limited Ruq  01/14/2016  CLINICAL DATA:  35 year old female with history of right upper quadrant abdominal pain for the past 3 weeks. EXAM: US ABDOMEN LIMITED - RIGHT UPPER QUADRANT COMPARISON:  No priors. FINDINGS: Gallbladder: No gallstones or wall thickening visualized. No sonographic Murphy sign noted by sonographer. Common bile duct: Diameter: 3.4 mm Liver: No focal lesion identified. Within normal limits in parenchymal echogenicity. IMPRESSION: 1. No acute findings. Specifically, no gallstones or findings to suggest an acute cholecystitis at this time. Electronically Signed   By: Trudie Reed M.D.   On: 01/14/2016 17:18      Medications:     Scheduled Medications: . aspirin EC  81 mg Oral Daily  . carvedilol  6.25 mg Oral BID WC  . furosemide  40 mg Intravenous BID  . heparin  5,000 Units Subcutaneous Q8H  . losartan  25 mg Oral Daily  . sodium chloride flush  3 mL Intravenous Q12H     Infusions:     PRN  Medications:  sodium chloride, acetaminophen, albuterol, ondansetron (ZOFRAN) IV, sodium chloride flush   Assessment:  1. 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 She has not had ischemic work up.  ECHO 01/15/2016 EF 10-15%. Severe MR. Diuresing well with 40 mg IV lasix. Continue one more day then transition to lasix 40 mg twice a day. Prior to admit she was on 40 mg po lasix daily.  Continue carvedilol 6.25 mg twice a day.  Stop losartan and start entretresto 24-26 mg twice a day.  Consider adding spiro tomorrow.  Plan to provide scale prior to discharge. She understands she needs to prevent pregnancy with HF meds. She is not currently sexually active. Discussed low salt food choices and limiting fluid intake to < 2 liters per day.   2. Snoring- Will need outpatient sleep study.  3. Morbid Obesity- Needs to lose considerable amount of weight. Dietitian meeting with her now.  4.  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 Central MR No stenosis. Dr Shirlee Latch to review ECHO.  Plan follow up in the HF clinic next week. She plans to move to [place] in the next few months and will need to establish follow up with Cardiology.    Length of Stay: 2   [name] Clegg NP-C  01/16/2016, 11:17 AM  Advanced Heart Failure Team Pager [phone] (M-F; 7a - 4p)  Please contact [facility] for night-coverage after hours (4p -7a ) and weekends on [email or web address]  Patient seen with NP, agree with the above note.  She remains volume overloaded on exam.  Echo reviewed: EF 10-15%, there is severe MR in the setting of severe LV dilation.  This seems to be most likely secondary MR due to inadequate leaflet coaptation with dilated annulus though in one view the posterior leaflet seems a bit thickened.    Apparently, she has had a cardiomyopathy since 2010.  It sounds like this was thought to be a post-partum cardiomyopathy at the time as she had just given birth to a child when she  was admitted initially with CHF.  She was followed at [facility] until around 2014, then moved to [place].  She really did not have much followup until 12/16 in [place] when she saw a cardiologist down there and had an echo showing that the EF remained low at 20-25% (MR read as moderate).  She was supposed to have cardiac cath because of exertional chest pain but decided against the procedure.  Etiology of cardiomyopathy remains unclear, possible post-partum CMP without improvement.  However, we need to get records from [place] (will try to obtain today).   For now, will continue diuresis with Lasix 40 mg IV bid.  Continue Coreg, and will transition from losartan to [place] today.  If BP remains stable, will start low dose spironolactone tomorrow.  After full diuresis, will arrange for RHC (may do angiography as well, will need to discuss with her).  Long-term, she wants to followup in [place].   Marca Ancona 01/16/2016 12:37 PM

## 2016-01-16 NOTE — Progress Notes (Signed)
Heart Failure Navigator Consult Note  Presentation: Kirsten Wheeler is a 35 y.o. 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 She is followed by Dr. Tomie Wheeler at [facility] in [place]. She last saw him in the office in February at which time he was going to repeat an echocardiogram and had a discussion about possible AICD or LifeVest with her which she was refusing. Her last echocardiogram with [facility] in December 2016 showed severely dilated LV and global hypokinesis with EF of 20-25%, moderate MR, mild TR and moderate to severe pulmonary hypertension. She now presents with one week of worsening shortness of breath and orthopnea. She's also noted worsening lower extremity edema. Review of her previous cardiology notes indicated that she has not had either invasive or noninvasive evaluation of ischemia. A left heart catheterization was proposed, but canceled after the patient watched the Video and did not wish to proceed. She's been given IV Lasix here and reports an improvement in her symptoms. D-dimer was elevated at 2. A CT angiogram was performed which demonstrated mild cardiomegaly, fatty infiltration in the liver, no pulmonary embolus, mild gallbladder wall thickening. Chest x-ray shows features consistent with congestive heart failure and mild interstitial edema. BNP was elevated at 648.  Past Medical History  Diagnosis Date  . Hearing loss     bilateral  . Asthma   . Heart failure (HCC)   . Obesity   . CHF (congestive heart failure) (HCC)     Social History   Social History  . Marital Status: Single    Spouse Name: N/A  . Number of Children: N/A  . Years of Education: N/A   Social History Main Topics  . Smoking status: Former Games developer  . Smokeless tobacco: None  . Alcohol Use: 0.0 oz/week    0 Standard drinks or equivalent per week     Comment: occasionally  . Drug Use: Yes    Special: Marijuana   Comment: only socially  . Sexual Activity: Not Asked   Other Topics Concern  . None   Social History Narrative    ECHO:Study Conclusions  - Left ventricle: The cavity size was severely dilated. Wall  thickness was normal. Systolic function was severely reduced. The  estimated ejection fraction was in the range of 10% to 15%.  Diffuse hypokinesis. Due to tachycardia, there was fusion of  early and atrial contributions to ventricular filling. The study  is not technically sufficient to allow evaluation of LV diastolic  function. - Mitral valve: There was tenting and malcoaptation of the valve  leaflets. There was severe regurgitation directed centrally. The  acceleration rate of the regurgitant jet was reduced, consistent  with a low dP/dt. Severe regurgitation is suggested by pulmonary  vein systolic flow reversal. - Left atrium: The atrium was mildly dilated. - Right ventricle: Systolic function was mildly reduced. - Tricuspid valve: There was mild-moderate regurgitation directed  centrally. - Pulmonary arteries: Systolic pressure was mildly increased. PA  peak pressure: 41 mm Hg (S).  Transthoracic echocardiography. M-mode, complete 2D, spectral Doppler, and color Doppler. Birthdate: Patient birthdate: 06/16/1981. Age: Patient is 35 yr old. Sex: Gender: female. BMI: 56.9 kg/m^2. Blood pressure: 143/86 Patient status: Inpatient. Study date: Study date: 01/15/2016. Study time: 08:04 AM. Location: Echo laboratory.  BNP    Component Value Date/Time   BNP 578.7* 01/15/2016 0239    ProBNP No results found for: PROBNP   Education Assessment and Provision:  Detailed education and  instructions provided on heart failure disease management including the following:  Signs and symptoms of Heart Failure When to call the physician Importance of daily weights Low sodium diet Fluid restriction Medication management Anticipated future follow-up  appointments  Patient education given on each of the above topics.  Patient acknowledges understanding and acceptance of all instructions.  I spoke with Kirsten Wheeler regarding her HF.  She tells me that she was told something was wrong with her heart shortly after her youngest child (35years old) was born.  She does not have a scale and does not weigh daily.  I will provide a scale for her home use.  I have reviewed the importance of daily weights and when to call the physician.  She can teach back regarding a low sodium diet and avoiding high sodium packaged processed foods --however admits that it is a challenge.  She follows with [facility] in [place] and requests to come to [facility] for her care in the future as she says she "feels more comfortable" here.  She says that she would like to have a nurse to check on her as she has no transportation and often "feels scared" when she " is short of breath".  She lives home alone with her children 32 and 48 years old.  Education Materials:  "Living Better With Heart Failure" Booklet, Daily Weight Tracker Tool    High Risk Criteria for Readmission and/or Poor Patient Outcomes:  (Recommend Follow-up with Advanced Heart Failure Clinic)-- yes patient requests HF clinic follow-up   EF <30%- Yes 10-15%  2 or more admissions in 6 months- No  Difficult social situation- Yes -patient has 2 children 72 and 28 years old--limited local social support  Demonstrates medication noncompliance- denies    Barriers of Care:  Health literacy, Knowledge and compliance, transportation  Discharge Planning:   Plans to return home to [place] with children.  She would benefit from [facility] for ongoing education, symptom recognition and compliance reinforcement.

## 2016-01-16 NOTE — Progress Notes (Signed)
Nutrition Education Note  RD consulted for nutrition education regarding new onset CHF.  RD reviewed and discussed "Heart Failure Nutrition Therapy" handout from the Academy of Nutrition and Dietetics. Reviewed patient's dietary recall. Provided examples on ways to decrease sodium intake in diet. Discouraged intake of processed foods and use of salt shaker. Encouraged fresh fruits and vegetables as well as whole grain sources of carbohydrates to maximize fiber intake.   RD discussed why it is important for patient to adhere to diet recommendations, and emphasized the role of fluids, foods to avoid, and importance of weighing self daily. Teach back method used.  Expect good compliance.  Body mass index is 56.87 kg/(m^2). Pt meets criteria for Morbid Obesity based on current BMI.  Current diet order is 2 Gram Sodium, patient is consuming approximately 100% of meals at this time. Labs and medications reviewed. No further nutrition interventions warranted at this time. RD contact information provided. If additional nutrition issues arise, please re-consult RD.   Dorothea Ogle RD, LDN Inpatient Clinical Dietitian Pager: [phone] After Hours Pager: [phone]

## 2016-01-16 NOTE — Progress Notes (Signed)
Patient refused bed alarm. Will continue to monitor patient. 

## 2016-01-16 NOTE — Progress Notes (Signed)
Subjective: Pt up and eating eggs for breakfast, no pain, nausea or distress.  Walking in room.  Objective: Vital signs in last 24 hours: Temp:  [97.7 F (36.5 C)-97.8 F (36.6 C)] 97.8 F (36.6 C) (05/02 0623) Pulse Rate:  [84-91] 84 (05/02 0623) Resp:  [18-20] 18 (05/02 0623) BP: (107-108)/(68-76) 108/68 mmHg (05/02 0623) SpO2:  [99 %] 99 % (05/02 0623) Weight:  [150.367 kg (331 lb 8 oz)] 150.367 kg (331 lb 8 oz) (05/02 0623) Last BM Date: 01/13/16 780 PO Urine 3125 Afebrile, VSS BMP OK No recheck of her LFT's since 4/30 HIDA: Normal and timely filling of gallbladder is noted. There is no scintigraphic evidence of cystic duct obstruction or cholecystitis.   Intake/Output from previous day: 05/01 0701 - 05/02 0700 In: 780 [P.O.:780] Out: 3125 [Urine:3125] Intake/Output this shift: Total I/O In: -  Out: 1300 [Urine:1300]  General appearance: alert, cooperative and no distress  Lab Results:   Recent Labs  01/14/16 1115  WBC 7.1  HGB 12.8  HCT 37.9  PLT 303    BMET  Recent Labs  01/15/16 0239 01/16/16 0228  NA 140 138  K 3.8 4.1  CL 102 100*  CO2 29 29  GLUCOSE 84 83  BUN 15 16  CREATININE 1.01* 1.10*  CALCIUM 8.9 9.0   PT/INR No results for input(s): LABPROT, INR in the last 72 hours.   Recent Labs Lab 01/14/16 1210  AST 25  ALT 21  ALKPHOS 49  BILITOT 2.5*  PROT 7.2  ALBUMIN 3.3*     Lipase     Component Value Date/Time   LIPASE 37 01/14/2016 1210     Studies/Results: Dg Chest 2 View  01/14/2016  CLINICAL DATA:  SOB and abdominal pain x 2 weeks. She states that today it was much worst. Hx of congestive heart failure, ex-smoker, asthma, obesity. EXAM: CHEST  2 VIEW COMPARISON:  None. FINDINGS: There is moderate to severe cardiac silhouette enlargement. There is mild diffuse interstitial prominence with mild azygos vein distension. No pleural effusion. IMPRESSION: Findings suggest congestive heart failure with mild interstitial  edema. Electronically Signed   By: Esperanza Heir M.D.   On: 01/14/2016 12:17   Ct Angio Chest Pe W/cm &/or Wo Cm  01/14/2016  CLINICAL DATA:  Shortness of breath, epigastric abdominal pain for 2 weeks. EXAM: CT ANGIOGRAPHY CHEST CT ABDOMEN AND PELVIS WITH CONTRAST TECHNIQUE: Multidetector CT imaging of the chest was performed using the standard protocol during bolus administration of intravenous contrast. Multiplanar CT image reconstructions and MIPs were obtained to evaluate the vascular anatomy. Multidetector CT imaging of the abdomen and pelvis was performed using the standard protocol during bolus administration of intravenous contrast. CONTRAST:  100 mL of Isovue 370 intravenously. COMPARISON:  Chest radiograph of same day. FINDINGS: CTA CHEST FINDINGS No pneumothorax or pleural effusion is noted. No acute pulmonary disease is noted. Mild cardiomegaly is noted. There is no evidence of thoracic aortic aneurysm. There is no evidence of pulmonary embolus. No mediastinal mass or adenopathy is noted. No significant osseous abnormality is noted in the chest. CT ABDOMEN and PELVIS FINDINGS No significant osseous abnormality is noted. Fatty infiltration of the liver is noted. The spleen and pancreas are unremarkable. Adrenal glands and kidneys are unremarkable. No hydronephrosis or renal obstruction is noted. Mild gallbladder wall thickening is noted with probable mild pericholecystic fluid concerning for cholecystitis. No gallstones are noted. Small amount of fluid is noted around the lateral portion of the right hepatic lobe. There  is no evidence of bowel obstruction. The appendix appears normal. Urinary bladder appears normal. Uterus and ovaries appear unremarkable. Mild amount of free fluid is noted in the right lower quadrant of unknown etiology. No significant adenopathy is noted. Review of the MIP images confirms the above findings. IMPRESSION: No evidence of pulmonary embolus. Mild cardiomegaly is noted.  No acute abnormality seen in the chest. Fatty infiltration of the liver. Mild gallbladder wall thickening is noted with probable pericholecystic fluid concerning for acute cholecystitis. No gallstones are noted. HIDA scan may be performed further evaluation. Mild amount of free fluid is noted in the right lower quadrant of unknown etiology. Possible ruptured ovarian cyst cannot be excluded. Pelvic ultrasound may be performed for further evaluation. The appendix appears normal. Electronically Signed   By: Lupita Raider, M.D.   On: 01/14/2016 15:38   Nm Hepatobiliary Liver Func  01/16/2016  CLINICAL DATA:  Right upper quadrant abdominal pain. EXAM: NUCLEAR MEDICINE HEPATOBILIARY IMAGING TECHNIQUE: Sequential images of the abdomen were obtained out to 60 minutes following intravenous administration of radiopharmaceutical. RADIOPHARMACEUTICALS:  5.0 mCi Tc-7m  Choletec IV COMPARISON:  Ultrasound and CT scan of January 14, 2016. FINDINGS: Prompt uptake and biliary excretion of activity by the liver is seen. Gallbladder activity is visualized, consistent with patency of cystic duct. Biliary activity passes into small bowel, consistent with patent common bile duct. IMPRESSION: Normal and timely filling of gallbladder is noted. There is no scintigraphic evidence of cystic duct obstruction or cholecystitis. Electronically Signed   By: Lupita Raider, M.D.   On: 01/16/2016 09:19   Ct Abdomen Pelvis W Contrast  01/14/2016  CLINICAL DATA:  Shortness of breath, epigastric abdominal pain for 2 weeks. EXAM: CT ANGIOGRAPHY CHEST CT ABDOMEN AND PELVIS WITH CONTRAST TECHNIQUE: Multidetector CT imaging of the chest was performed using the standard protocol during bolus administration of intravenous contrast. Multiplanar CT image reconstructions and MIPs were obtained to evaluate the vascular anatomy. Multidetector CT imaging of the abdomen and pelvis was performed using the standard protocol during bolus administration of  intravenous contrast. CONTRAST:  100 mL of Isovue 370 intravenously. COMPARISON:  Chest radiograph of same day. FINDINGS: CTA CHEST FINDINGS No pneumothorax or pleural effusion is noted. No acute pulmonary disease is noted. Mild cardiomegaly is noted. There is no evidence of thoracic aortic aneurysm. There is no evidence of pulmonary embolus. No mediastinal mass or adenopathy is noted. No significant osseous abnormality is noted in the chest. CT ABDOMEN and PELVIS FINDINGS No significant osseous abnormality is noted. Fatty infiltration of the liver is noted. The spleen and pancreas are unremarkable. Adrenal glands and kidneys are unremarkable. No hydronephrosis or renal obstruction is noted. Mild gallbladder wall thickening is noted with probable mild pericholecystic fluid concerning for cholecystitis. No gallstones are noted. Small amount of fluid is noted around the lateral portion of the right hepatic lobe. There is no evidence of bowel obstruction. The appendix appears normal. Urinary bladder appears normal. Uterus and ovaries appear unremarkable. Mild amount of free fluid is noted in the right lower quadrant of unknown etiology. No significant adenopathy is noted. Review of the MIP images confirms the above findings. IMPRESSION: No evidence of pulmonary embolus. Mild cardiomegaly is noted. No acute abnormality seen in the chest. Fatty infiltration of the liver. Mild gallbladder wall thickening is noted with probable pericholecystic fluid concerning for acute cholecystitis. No gallstones are noted. HIDA scan may be performed further evaluation. Mild amount of free fluid is noted in the right  lower quadrant of unknown etiology. Possible ruptured ovarian cyst cannot be excluded. Pelvic ultrasound may be performed for further evaluation. The appendix appears normal. Electronically Signed   By: Lupita Raider, M.D.   On: 01/14/2016 15:38   US Abdomen Limited Ruq  01/14/2016  CLINICAL DATA:  35 year old female  with history of right upper quadrant abdominal pain for the past 3 weeks. EXAM: US ABDOMEN LIMITED - RIGHT UPPER QUADRANT COMPARISON:  No priors. FINDINGS: Gallbladder: No gallstones or wall thickening visualized. No sonographic Murphy sign noted by sonographer. Common bile duct: Diameter: 3.4 mm Liver: No focal lesion identified. Within normal limits in parenchymal echogenicity. IMPRESSION: 1. No acute findings. Specifically, no gallstones or findings to suggest an acute cholecystitis at this time. Electronically Signed   By: Trudie Reed M.D.   On: 01/14/2016 17:18    Medications: . aspirin EC  81 mg Oral Daily  . carvedilol  6.25 mg Oral BID WC  . furosemide  40 mg Intravenous BID  . heparin  5,000 Units Subcutaneous Q8H  . sacubitril-valsartan  1 tablet Oral BID  . sodium chloride flush  3 mL Intravenous Q12H     Assessment/Plan Epigastric pain with abnormal CT/negative Ultrasound of the abdomen HIDA:  Normal and timely filling of gallbladder is noted. 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 Will be available as needed.    LOS: 2 days    Alexsander Cavins 01/16/2016 [phone]

## 2016-01-16 NOTE — Progress Notes (Signed)
CARDIAC REHAB PHASE I   Pt in bed, agreeable to ambulate, states she has been feeling "down" today. Went to get clean gown for pt, upon return to room pt's sister and friend at bedside, pt's family states they came from [place] to visit her and would like cardiac rehab to return at a later time as they are here to visit with pt, pt agrees. Will follow-up today as schedule permits. If not, will follow-up tomorrow.   Joylene Grapes, RN, BSN 01/16/2016 2:28 PM

## 2016-01-16 NOTE — Progress Notes (Signed)
    Returned page to RN on 3E about patient having nonsustained run of [place]. Reports patient is asymptomatic during this episode. Will follow closely.

## 2016-01-17 LAB — BASIC METABOLIC PANEL
ANION GAP: 9 (ref 5–15)
BUN: 18 mg/dL (ref 6–20)
CALCIUM: 9 mg/dL (ref 8.9–10.3)
CO2: 29 mmol/L (ref 22–32)
Chloride: 100 mmol/L — ABNORMAL LOW (ref 101–111)
Creatinine, Ser: 1.05 mg/dL — ABNORMAL HIGH (ref 0.44–1.00)
Glucose, Bld: 98 mg/dL (ref 65–99)
POTASSIUM: 4 mmol/L (ref 3.5–5.1)
Sodium: 138 mmol/L (ref 135–145)

## 2016-01-17 MED ORDER — SODIUM CHLORIDE 0.9% FLUSH: 3.0000 mL | INTRAVENOUS | Status: DC | PRN

## 2016-01-17 MED ORDER — SODIUM CHLORIDE 0.9 % IV SOLN: 250.0000 mL | INTRAVENOUS | Status: DC | PRN

## 2016-01-17 MED ORDER — SODIUM CHLORIDE 0.9 % IV SOLN
INTRAVENOUS | Status: DC
  Administered 2016-01-18: 06:00:00 via INTRAVENOUS

## 2016-01-17 MED ORDER — FUROSEMIDE 40 MG PO TABS
40.0000 mg | ORAL_TABLET | Freq: Two times a day (BID) | ORAL | Status: DC
  Administered 2016-01-17 – 2016-01-18 (×2): 40 mg via ORAL
  Filled 2016-01-17 (×2): qty 1

## 2016-01-17 MED ORDER — ASPIRIN 81 MG PO CHEW
81.0000 mg | CHEWABLE_TABLET | ORAL | Status: AC
  Administered 2016-01-18: 81 mg via ORAL
  Filled 2016-01-17: qty 1

## 2016-01-17 MED ORDER — SODIUM CHLORIDE 0.9% FLUSH
3.0000 mL | Freq: Two times a day (BID) | INTRAVENOUS | Status: DC
  Administered 2016-01-18: 3 mL via INTRAVENOUS

## 2016-01-17 NOTE — Progress Notes (Signed)
Advanced Heart Failure Rounding Note   Subjective:    Admitted with increased dyspnea. 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 Never had ischemic work up.   Yesterday she continued to diurese with IV lasix. Started on entresto.    Overall feeling much better. Denies SOB.   Objective:   Weight Range:  Vital Signs:   Temp:  [97.7 F (36.5 C)-97.8 F (36.6 C)] 97.7 F (36.5 C) (05/03 0720) Pulse Rate:  [88-102] 88 (05/03 0720) Resp:  [18] 18 (05/03 0720) BP: (94-112)/(69-77) 94/69 mmHg (05/03 0720) SpO2:  [96 %-98 %] 98 % (05/03 0720) Weight:  [330 lb (149.687 kg)] 330 lb (149.687 kg) (05/03 0720) Last BM Date: 01/16/16  Weight change: Filed Weights   01/15/16 0628 01/16/16 0623 01/17/16 0720  Weight: 331 lb 8 oz (150.367 kg) 331 lb 8 oz (150.367 kg) 330 lb (149.687 kg)    Intake/Output:   Intake/Output Summary (Last 24 hours) at 01/17/16 0856 Last data filed at 01/17/16 0846  Gross per 24 hour  Intake    703 ml  Output   3800 ml  Net  -3097 ml     Physical Exam: General:  Well appearing. No resp difficulty. In bed.  HEENT: normal Neck: supple. JVP 8-9cm . Carotids 2+ bilat; no bruits. No lymphadenopathy or thyromegaly appreciated. Cor: PMI nondisplaced. Regular rate & rhythm. No gallop, 1/6 HSM apex.  Lungs: clear Abdomen: Obese, soft, nontender, nondistended. No hepatosplenomegaly. No bruits or masses. Good bowel sounds. Extremities: no cyanosis, clubbing, rash, R and LLE trace edema Neuro: alert & orientedx3, cranial nerves grossly intact. moves all 4 extremities w/o difficulty. Affect pleasant  Telemetry: NSR 90s   Labs: Basic Metabolic Panel:  Recent Labs Lab 01/14/16 1115 01/15/16 0239 01/16/16 0228 01/17/16 0334  NA 138 140 138 138  K 4.6 3.8 4.1 4.0  CL 105 102 100* 100*  CO2 GLUCOSE 95 84 83 98  BUN CREATININE 1.06* 1.01* 1.10* 1.05*  CALCIUM 9.1 8.9 9.0 9.0    Liver  Function Tests:  Recent Labs Lab 01/14/16 1210  AST 25  ALT 21  ALKPHOS 49  BILITOT 2.5*  PROT 7.2  ALBUMIN 3.3*    Recent Labs Lab 01/14/16 1210  LIPASE 37   No results for input(s): AMMONIA in the last 168 hours.  CBC:  Recent Labs Lab 01/14/16 1115  WBC 7.1  HGB 12.8  HCT 37.9  MCV 76.4*  PLT 303    Cardiac Enzymes:  Recent Labs Lab 01/14/16 1210  TROPONINI <0.03    BNP: BNP (last 3 results)  Recent Labs  01/14/16 1210 01/15/16 0239  BNP 647.9* 578.7*    ProBNP (last 3 results) No results for input(s): PROBNP in the last 8760 hours.    Other results:  Imaging: Nm Hepatobiliary Liver Func  01/16/2016  CLINICAL DATA:  Right upper quadrant abdominal pain. EXAM: NUCLEAR MEDICINE HEPATOBILIARY IMAGING TECHNIQUE: Sequential images of the abdomen were obtained out to 60 minutes following intravenous administration of radiopharmaceutical. RADIOPHARMACEUTICALS:  5.0 mCi Tc-65m  Choletec IV COMPARISON:  Ultrasound and CT scan of January 14, 2016. FINDINGS: Prompt uptake and biliary excretion of activity by the liver is seen. Gallbladder activity is visualized, consistent with patency of cystic duct. Biliary activity passes into small bowel, consistent with patent common bile duct. IMPRESSION: Normal and timely filling of gallbladder is noted. There is no scintigraphic evidence of cystic  duct obstruction or cholecystitis. Electronically Signed   By: Lupita Raider, M.D.   On: 01/16/2016 09:19     Medications:     Scheduled Medications: . aspirin EC  81 mg Oral Daily  . carvedilol  6.25 mg Oral BID WC  . furosemide  40 mg Intravenous BID  . heparin  5,000 Units Subcutaneous Q8H  . sacubitril-valsartan  1 tablet Oral BID  . sodium chloride flush  3 mL Intravenous Q12H    Infusions:    PRN Medications: sodium chloride, acetaminophen, albuterol, ondansetron (ZOFRAN) IV, sodium chloride flush   Assessment:   1. 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 She has not had ischemic work up.  ECHO 01/15/2016 EF 10-15%. Severe MR. Diuresing well with 40 mg IV lasix. Give one more dose of IV lasix then start 40 mg lasix po twice a day. First dose of po lasix this afternoon. Continue carvedilol 6.25 mg twice a day.  Continue Entresto 24-26 mg twice a day. Hold off on spiro with soft BP.  Plan to provide scale prior to discharge. She understands she needs to prevent pregnancy with HF meds. She is not currently sexually active. Discussed low salt food choices and limiting fluid intake to < 2 liters per day.   2. Snoring- Will need outpatient sleep study.  3. Morbid Obesity- Needs to lose considerable amount of weight. Dietitian meeting with her now.  4.  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 Central MR No stenosis. Reviewed by Dr Shirlee Latch and MR seems to be related to severe LV dilation.    She is agreeable to RHC/LHC. Will set up for Thursday at 12:30   Length of Stay: 3   [name] Clegg NP-C  01/17/2016, 8:56 AM  Advanced Heart Failure Team Pager [phone] (M-F; 7a - 4p)  Please contact [facility] for night-coverage after hours (4p -7a ) and weekends on [email or web address]  Patient seen with NP, agree with the above note. Echo: EF 10-15%, there is severe MR in the setting of severe LV dilation. This seems to be most likely secondary MR due to inadequate leaflet coaptation with dilated annulus though in one view the posterior leaflet seems a bit thickened.   Apparently, she has had a cardiomyopathy since 2010. It sounds like this was thought to be a post-partum cardiomyopathy at the time as she had just given birth to a child when she was admitted initially with CHF. She was followed at [facility] until around 2014, then moved to [place]. She really did not have much followup until 12/16 in [place] when she saw a cardiologist down there and had an echo showing that the EF remained low at 20-25% (MR read as  moderate). She was supposed to have cardiac cath because of exertional chest pain but decided against the procedure. Etiology of cardiomyopathy remains unclear, possible post-partum CMP without improvement. However, we need to get records from [place] (will try to obtain today).   Volume status looks improved and she diuresed well. Will transition over to po Lasix today.  BP soft, not sure she will tolerate Entresto but will continue for now.  Continue Coreg.  Tomorrow, will do right and left heart catheterization to assess cardiac output, filling pressures, and rule out CAD.  I discussed risks/benefits with her today and she agrees to proceed. Long-term, she wants to followup in [place].   Marca Ancona 01/17/2016 12:28 PM

## 2016-01-17 NOTE — Progress Notes (Signed)
CARDIAC REHAB PHASE I   PRE:  Rate/Rhythm: 89 SR witht PVC    BP: sitting 99/59    SaO2:   MODE:  Ambulation: 120 ft + 220 ft   POST:  Rate/Rhythm: 110 ST    BP: sitting 108/70     SaO2:   Pt has had low BP earlier today. Sts she feels fatigued in general with some lightheadedness. Eager to walk. Began feeling lightheaded after 60 ft therefore returned to room to take BP. 117/91 therefore she was able to walk 220 ft more. Fatigues easily. Discussed low sodium, daily wts, sx of HF, and CRPII. Pt is eager to ex and get back to her norm. Will send referral to [place] [facility]II.  Will await cath results to give ex gl. [id number]  Harriet Masson CES, ACSM 01/17/2016 2:58 PM

## 2016-01-18 ENCOUNTER — Encounter (HOSPITAL_COMMUNITY): Payer: Self-pay | Admitting: Cardiology

## 2016-01-18 ENCOUNTER — Encounter (HOSPITAL_COMMUNITY)
Admission: EM | Disposition: A | Discharge status: Home / Self Care | Payer: Self-pay | Source: Home / Self Care | Attending: Internal Medicine

## 2016-01-18 DIAGNOSIS — R0683 Snoring: Secondary | ICD-10-CM

## 2016-01-18 DIAGNOSIS — I429 Cardiomyopathy, unspecified: Secondary | ICD-10-CM

## 2016-01-18 DIAGNOSIS — I509 Heart failure, unspecified: Secondary | ICD-10-CM

## 2016-01-18 HISTORY — PX: CARDIAC CATHETERIZATION: SHX172

## 2016-01-18 LAB — CBC
HEMATOCRIT: 40 % (ref 36.0–46.0)
HEMOGLOBIN: 13.2 g/dL (ref 12.0–15.0)
MCH: 25.2 pg — ABNORMAL LOW (ref 26.0–34.0)
MCHC: 33 g/dL (ref 30.0–36.0)
MCV: 76.3 fL — AB (ref 78.0–100.0)
Platelets: 281 10*3/uL (ref 150–400)
RBC: 5.24 MIL/uL — ABNORMAL HIGH (ref 3.87–5.11)
RDW: 16.3 % — ABNORMAL HIGH (ref 11.5–15.5)
WBC: 5 10*3/uL (ref 4.0–10.5)

## 2016-01-18 LAB — BASIC METABOLIC PANEL
ANION GAP: 9 (ref 5–15)
ANION GAP: 13 (ref 5–15)
BUN: 15 mg/dL (ref 6–20)
BUN: 13 mg/dL (ref 6–20)
CHLORIDE: 101 mmol/L (ref 101–111)
CO2: 29 mmol/L (ref 22–32)
CO2: 24 mmol/L (ref 22–32)
Calcium: 8.9 mg/dL (ref 8.9–10.3)
Calcium: 9.1 mg/dL (ref 8.9–10.3)
Chloride: 99 mmol/L — ABNORMAL LOW (ref 101–111)
Creatinine, Ser: 0.98 mg/dL (ref 0.44–1.00)
Creatinine, Ser: 0.95 mg/dL (ref 0.44–1.00)
GFR calc Af Amer: >60 mL/min (ref >60)
GFR calc non Af Amer: >60 mL/min (ref >60)
GLUCOSE: 91 mg/dL (ref 65–99)
Glucose, Bld: 78 mg/dL (ref 65–99)
POTASSIUM: 3.8 mmol/L (ref 3.5–5.1)
POTASSIUM: 4.4 mmol/L (ref 3.5–5.1)
SODIUM: 137 mmol/L (ref 135–145)
SODIUM: 138 mmol/L (ref 135–145)

## 2016-01-18 LAB — POCT I-STAT 3, VENOUS BLOOD GAS (G3P V)
ACID-BASE EXCESS: 3 mmol/L — AB (ref 0.0–2.0)
ACID-BASE EXCESS: 4 mmol/L — AB (ref 0.0–2.0)
BICARBONATE: 29.5 meq/L — AB (ref 20.0–24.0)
Bicarbonate: 29.5 mEq/L — ABNORMAL HIGH (ref 20.0–24.0)
O2 Saturation: 44 %
O2 Saturation: 45 %
PH VEN: 7.382 — AB (ref 7.250–7.300)
PH VEN: 7.392 — AB (ref 7.250–7.300)
PO2 VEN: 25 mmHg — AB (ref 31.0–45.0)
TCO2: 31 mmol/L (ref 0–100)
TCO2: 31 mmol/L (ref 0–100)
pCO2, Ven: 48.4 mmHg (ref 45.0–50.0)
pCO2, Ven: 49.7 mmHg (ref 45.0–50.0)
pO2, Ven: 25 mmHg — ABNORMAL LOW (ref 31.0–45.0)

## 2016-01-18 LAB — PROTIME-INR
INR: 1.14 (ref 0.00–1.49)
Prothrombin Time: 14.8 seconds (ref 11.6–15.2)

## 2016-01-18 LAB — MAGNESIUM: Magnesium: 2 mg/dL (ref 1.7–2.4)

## 2016-01-18 LAB — BRAIN NATRIURETIC PEPTIDE: B Natriuretic Peptide: 366.2 pg/mL — ABNORMAL HIGH (ref 0.0–100.0)

## 2016-01-18 SURGERY — RIGHT/LEFT HEART CATH AND CORONARY ANGIOGRAPHY

## 2016-01-18 MED ORDER — MIDAZOLAM HCL 2 MG/2ML IJ SOLN
INTRAMUSCULAR | Status: AC
  Filled 2016-01-18: qty 2

## 2016-01-18 MED ORDER — HEPARIN SODIUM (PORCINE) 1000 UNIT/ML IJ SOLN
INTRAMUSCULAR | Status: AC
  Filled 2016-01-18: qty 1

## 2016-01-18 MED ORDER — CARVEDILOL 6.25 MG PO TABS
6.2500 mg | ORAL_TABLET | Freq: Two times a day (BID) | ORAL | Status: DC
  Administered 2016-01-18 – 2016-01-20 (×5): 6.25 mg via ORAL
  Filled 2016-01-18 (×5): qty 1

## 2016-01-18 MED ORDER — SODIUM CHLORIDE 0.9% FLUSH
3.0000 mL | Freq: Two times a day (BID) | INTRAVENOUS | Status: DC
  Administered 2016-01-18 – 2016-01-19 (×2): 3 mL via INTRAVENOUS

## 2016-01-18 MED ORDER — MIDAZOLAM HCL 2 MG/2ML IJ SOLN
INTRAMUSCULAR | Status: DC | PRN
  Administered 2016-01-18 (×2): 1 mg via INTRAVENOUS

## 2016-01-18 MED ORDER — IOPAMIDOL (ISOVUE-370) INJECTION 76%
INTRAVENOUS | Status: DC | PRN
  Administered 2016-01-18: 40 mL via INTRA_ARTERIAL

## 2016-01-18 MED ORDER — HEPARIN SODIUM (PORCINE) 1000 UNIT/ML IJ SOLN
INTRAMUSCULAR | Status: DC | PRN
  Administered 2016-01-18: 5000 [IU] via INTRAVENOUS

## 2016-01-18 MED ORDER — SODIUM CHLORIDE 0.9% FLUSH: 3.0000 mL | INTRAVENOUS | Status: DC | PRN

## 2016-01-18 MED ORDER — FENTANYL CITRATE (PF) 100 MCG/2ML IJ SOLN
INTRAMUSCULAR | Status: DC | PRN
  Administered 2016-01-18 (×2): 25 ug via INTRAVENOUS

## 2016-01-18 MED ORDER — DIGOXIN 125 MCG PO TABS
0.1250 mg | ORAL_TABLET | Freq: Every day | ORAL | Status: DC
  Administered 2016-01-18 – 2016-01-20 (×3): 0.125 mg via ORAL
  Filled 2016-01-18 (×4): qty 1

## 2016-01-18 MED ORDER — HEPARIN (PORCINE) IN NACL 2-0.9 UNIT/ML-% IJ SOLN
INTRAMUSCULAR | Status: AC
  Filled 2016-01-18: qty 1000

## 2016-01-18 MED ORDER — SODIUM CHLORIDE 0.9 % IV SOLN: 250.0000 mL | INTRAVENOUS | Status: DC | PRN

## 2016-01-18 MED ORDER — LOSARTAN POTASSIUM 25 MG PO TABS
12.5000 mg | ORAL_TABLET | Freq: Two times a day (BID) | ORAL | Status: DC
  Administered 2016-01-18 – 2016-01-20 (×4): 12.5 mg via ORAL
  Filled 2016-01-18 (×3): qty 1
  Filled 2016-01-18: qty 0.5
  Filled 2016-01-18: qty 1

## 2016-01-18 MED ORDER — ONDANSETRON HCL 4 MG/2ML IJ SOLN: 4.0000 mg | Freq: Four times a day (QID) | INTRAMUSCULAR | Status: DC | PRN

## 2016-01-18 MED ORDER — LIDOCAINE HCL (PF) 1 % IJ SOLN
INTRAMUSCULAR | Status: AC
  Filled 2016-01-18: qty 30

## 2016-01-18 MED ORDER — FENTANYL CITRATE (PF) 100 MCG/2ML IJ SOLN
INTRAMUSCULAR | Status: AC
  Filled 2016-01-18: qty 2

## 2016-01-18 MED ORDER — POTASSIUM CHLORIDE CRYS ER 20 MEQ PO TBCR
40.0000 meq | EXTENDED_RELEASE_TABLET | Freq: Once | ORAL | Status: AC
  Administered 2016-01-18: 40 meq via ORAL
  Filled 2016-01-18: qty 2

## 2016-01-18 MED ORDER — DIAZEPAM 2 MG PO TABS: 2.0000 mg | ORAL_TABLET | Freq: Once | ORAL | Status: DC

## 2016-01-18 MED ORDER — VERAPAMIL HCL 2.5 MG/ML IV SOLN
INTRAVENOUS | Status: AC
  Filled 2016-01-18: qty 2

## 2016-01-18 MED ORDER — IRBESARTAN 75 MG PO TABS: 37.5000 mg | ORAL_TABLET | Freq: Two times a day (BID) | ORAL | Status: DC

## 2016-01-18 MED ORDER — SACUBITRIL-VALSARTAN 24-26 MG PO TABS: 1.0000 | ORAL_TABLET | Freq: Two times a day (BID) | ORAL | Status: DC

## 2016-01-18 MED ORDER — SODIUM CHLORIDE 0.9% FLUSH
3.0000 mL | Freq: Two times a day (BID) | INTRAVENOUS | Status: DC
  Administered 2016-01-18 – 2016-01-20 (×3): 3 mL via INTRAVENOUS

## 2016-01-18 MED ORDER — VERAPAMIL HCL 2.5 MG/ML IV SOLN
INTRAVENOUS | Status: DC | PRN
  Administered 2016-01-18: 13:00:00 via INTRA_ARTERIAL

## 2016-01-18 MED ORDER — LIDOCAINE HCL (PF) 1 % IJ SOLN
INTRAMUSCULAR | Status: DC | PRN
  Administered 2016-01-18: 2 mL via SUBCUTANEOUS
  Administered 2016-01-18: 1 mL via SUBCUTANEOUS

## 2016-01-18 MED ORDER — HEPARIN (PORCINE) IN NACL 2-0.9 UNIT/ML-% IJ SOLN
INTRAMUSCULAR | Status: DC | PRN
  Administered 2016-01-18: 1000 mL via INTRA_ARTERIAL

## 2016-01-18 MED ORDER — ACETAMINOPHEN 325 MG PO TABS
650.0000 mg | ORAL_TABLET | ORAL | Status: DC | PRN
  Administered 2016-01-19: 650 mg via ORAL
  Filled 2016-01-18: qty 2

## 2016-01-18 MED ORDER — HEPARIN SODIUM (PORCINE) 5000 UNIT/ML IJ SOLN
5000.0000 [IU] | Freq: Three times a day (TID) | INTRAMUSCULAR | Status: DC
  Administered 2016-01-18 – 2016-01-20 (×5): 5000 [IU] via SUBCUTANEOUS
  Filled 2016-01-18 (×5): qty 1

## 2016-01-18 MED ORDER — FUROSEMIDE 10 MG/ML IJ SOLN
40.0000 mg | Freq: Two times a day (BID) | INTRAMUSCULAR | Status: DC
  Administered 2016-01-18: 40 mg via INTRAVENOUS
  Filled 2016-01-18: qty 4

## 2016-01-18 MED ORDER — LOSARTAN POTASSIUM 25 MG PO TABS: 12.5000 mg | ORAL_TABLET | Freq: Two times a day (BID) | ORAL | Status: DC

## 2016-01-18 SURGICAL SUPPLY — 14 items
CATH BALLN WEDGE 5F 110CM (CATHETERS) ×3 IMPLANT
CATH INFINITI 5 FR JL3.5 (CATHETERS) ×3 IMPLANT
CATH INFINITI JR4 5F (CATHETERS) ×3 IMPLANT
DEVICE RAD COMP TR BAND LRG (VASCULAR PRODUCTS) ×3 IMPLANT
GLIDESHEATH SLEND SS 6F .021 (SHEATH) ×3 IMPLANT
HOVERMATT SINGLE USE (MISCELLANEOUS) ×3 IMPLANT
KIT HEART LEFT (KITS) ×3 IMPLANT
PACK CARDIAC CATHETERIZATION (CUSTOM PROCEDURE TRAY) ×3 IMPLANT
SHEATH FAST CATH BRACH 5F 5CM (SHEATH) ×3 IMPLANT
TRANSDUCER W/STOPCOCK (MISCELLANEOUS) ×3 IMPLANT
TUBING ART PRESS 72  MALE/FEM (TUBING) ×2
TUBING ART PRESS 72 MALE/FEM (TUBING) ×1 IMPLANT
TUBING CIL FLEX 10 FLL-RA (TUBING) ×3 IMPLANT
WIRE SAFE-T 1.5MM-J .035X260CM (WIRE) ×3 IMPLANT

## 2016-01-18 NOTE — Progress Notes (Addendum)
Advanced Heart Failure Rounding Note   Subjective:    Admitted with increased dyspnea. 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 Never had ischemic work up.   Yesterday transitioned to po lasix. Only had one dose of entresto due to hypotension and dizziness. Able to walk in hall.   Feeling better.    Objective:   Weight Range:  Vital Signs:   Temp:  [97.8 F (36.6 C)-98.7 F (37.1 C)] 97.9 F (36.6 C) (05/04 0608) Pulse Rate:  [86-97] 92 (05/04 0608) Resp:  [18-20] 18 (05/04 0608) BP: (87-113)/(68-89) 101/71 mmHg (05/04 0608) SpO2:  [96 %-99 %] 97 % (05/04 0608) Weight:  [330 lb (149.687 kg)] 330 lb (149.687 kg) (05/04 0608) Last BM Date: 01/17/16  Weight change: Filed Weights   01/16/16 0623 01/17/16 0720 01/18/16 0100  Weight: 331 lb 8 oz (150.367 kg) 330 lb (149.687 kg) 330 lb (149.687 kg)    Intake/Output:   Intake/Output Summary (Last 24 hours) at 01/18/16 0931 Last data filed at 01/18/16 0612  Gross per 24 hour  Intake   1083 ml  Output   2750 ml  Net  -1667 ml     Physical Exam: General:  Well appearing. No resp difficulty. In bed.  HEENT: normal Neck: supple. JVP 6-7cm . Carotids 2+ bilat; no bruits. No lymphadenopathy or thyromegaly appreciated. Cor: PMI nondisplaced. Regular rate & rhythm. No gallop, 1/6 HSM apex.  Lungs: clear Abdomen: Obese, soft, nontender, nondistended. No hepatosplenomegaly. No bruits or masses. Good bowel sounds. Extremities: no cyanosis, clubbing, rash, R and LLE trace edema Neuro: alert & orientedx3, cranial nerves grossly intact. moves all 4 extremities w/o difficulty. Affect pleasant  Telemetry: NSR 90s   Labs: Basic Metabolic Panel:  Recent Labs Lab 01/14/16 1115 01/15/16 0239 01/16/16 0228 01/17/16 0334 01/18/16 0358  NA 138 140 138 138 137  K 4.6 3.8 4.1 4.0 3.8  CL 105 102 100* 100* 99*  CO2 25 29 29 29 29   GLUCOSE 95 84 83 98 91  BUN 16 15 16 18 15   CREATININE  1.06* 1.01* 1.10* 1.05* 0.98  CALCIUM 9.1 8.9 9.0 9.0 8.9    Liver Function Tests:  Recent Labs Lab 01/14/16 1210  AST 25  ALT 21  ALKPHOS 49  BILITOT 2.5*  PROT 7.2  ALBUMIN 3.3*    Recent Labs Lab 01/14/16 1210  LIPASE 37   No results for input(s): AMMONIA in the last 168 hours.  CBC:  Recent Labs Lab 01/14/16 1115  WBC 7.1  HGB 12.8  HCT 37.9  MCV 76.4*  PLT 303    Cardiac Enzymes:  Recent Labs Lab 01/14/16 1210  TROPONINI <0.03    BNP: BNP (last 3 results)  Recent Labs  01/14/16 1210 01/15/16 0239 01/18/16 0358  BNP 647.9* 578.7* 366.2*    ProBNP (last 3 results) No results for input(s): PROBNP in the last 8760 hours.    Other results:  Imaging: No results found.   Medications:     Scheduled Medications: . aspirin EC  81 mg Oral Daily  . carvedilol  6.25 mg Oral BID WC  . diazepam  2 mg Oral Once  . furosemide  40 mg Oral BID  . heparin  5,000 Units Subcutaneous Q8H  . sacubitril-valsartan  1 tablet Oral BID  . sodium chloride flush  3 mL Intravenous Q12H  . sodium chloride flush  3 mL Intravenous Q12H    Infusions: . sodium chloride 10 mL/hr  at 01/18/16 0600    PRN Medications: sodium chloride, sodium chloride, acetaminophen, albuterol, ondansetron (ZOFRAN) IV, sodium chloride flush, sodium chloride flush   Assessment:   1. 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 She has not had ischemic work up though this had been planned due to episodes of exertional chest pain. ECHO 01/15/2016 EF 10-15%. Severe MR.  Volume status appears stable. Continue po lasix and adjust if needed after cath.  - Continue carvedilol 6.25 mg twice a day.  - Hold this mornings entresto. Check orthostatics. - Hold off on spiro with soft BP.  - Plan to provide scale prior to discharge. She understands she needs to prevent pregnancy with HF meds. She is not currently sexually active. Discussed low salt food choices  and limiting fluid intake to < 2 liters per day.   2. Snoring- Will need outpatient sleep study.  3. Morbid Obesity- Needs to lose considerable amount of weight. Dietitian meeting with her now.  4.  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 Central MR No stenosis. Reviewed by Dr Shirlee Latch and MR seems to be related to severe LV dilation.    RHC/LHC today. Refer to [facility] for [facility].   Length of Stay: 4  [name] Clegg NP-C  01/18/2016, 9:31 AM  Advanced Heart Failure Team Pager [phone] (M-F; 7a - 4p)  Please contact [facility] for night-coverage after hours (4p -7a ) and weekends on [email or web address]  Patient seen with NP, agree with the above note.  I do not think that her BP is going to tolerate Entresto right now.  Will have her go back on valsartan at 40 mg bid for now.    She will have RHC/LHC today.  Has had exertional chest pain so will do angiography as well as RHC.  We discussed risks/benefits of cath and she agrees to proceed.   Marca Ancona 01/18/2016 12:53 PM

## 2016-01-18 NOTE — Progress Notes (Signed)
TR band removed at 1820. A 2x2 guaze with tegaderm was applied. Site remains level 0. R pulses remain 2+, extremity is warm to touch, normal color for ethnicity. Vital signs stable. Pt instructed to not put any pressure or use arm. Pt instructed to call if arm begins to bleed. Pt verbalized understanding. Will continue to monitor pt.  Arletta Lumadue RN 

## 2016-01-18 NOTE — Progress Notes (Signed)
Patient alarmed 5 beats Vtach.  Patient asymptomatic. [name], NP made aware. Will continue to monitor.

## 2016-01-18 NOTE — Progress Notes (Signed)
Site area: Right brachial a 5 french venous sheath was removed  Site Prior to Removal:  Level 0  Pressure Applied For 10 MINUTES    Bedrest Beginning at 1415p  Manual:   Yes.    Patient Status During Pull:  stable  Post Pull Groin Site:  Level 0  Post Pull Instructions Given:  Yes.    Post Pull Pulses Present:  Yes.    Dressing Applied:  Yes.    Comments:  VS remain stable during sheath pull.

## 2016-01-18 NOTE — Progress Notes (Signed)
Pt with mild anxiety regarding cardiac cath in the AM. RN educated and reassured pt. Pt to sign consent and receive 2nd IV in AM. Other pre-op orders completed. Will continue to monitor.

## 2016-01-19 LAB — BASIC METABOLIC PANEL
ANION GAP: 10 (ref 5–15)
BUN: 12 mg/dL (ref 6–20)
CALCIUM: 9.1 mg/dL (ref 8.9–10.3)
CO2: 26 mmol/L (ref 22–32)
Chloride: 103 mmol/L (ref 101–111)
Creatinine, Ser: 0.97 mg/dL (ref 0.44–1.00)
GFR calc Af Amer: >60 mL/min (ref >60)
GLUCOSE: 86 mg/dL (ref 65–99)
Potassium: 4.1 mmol/L (ref 3.5–5.1)
SODIUM: 139 mmol/L (ref 135–145)

## 2016-01-19 LAB — CBC
HCT: 40.3 % (ref 36.0–46.0)
Hemoglobin: 13.4 g/dL (ref 12.0–15.0)
MCH: 25.7 pg — ABNORMAL LOW (ref 26.0–34.0)
MCHC: 33.3 g/dL (ref 30.0–36.0)
MCV: 77.2 fL — ABNORMAL LOW (ref 78.0–100.0)
Platelets: 284 10*3/uL (ref 150–400)
RBC: 5.22 MIL/uL — ABNORMAL HIGH (ref 3.87–5.11)
RDW: 16.5 % — AB (ref 11.5–15.5)
WBC: 5.8 10*3/uL (ref 4.0–10.5)

## 2016-01-19 MED ORDER — FUROSEMIDE 10 MG/ML IJ SOLN
40.0000 mg | Freq: Three times a day (TID) | INTRAMUSCULAR | Status: AC
  Administered 2016-01-19 (×3): 40 mg via INTRAVENOUS
  Filled 2016-01-19 (×3): qty 4

## 2016-01-19 MED ORDER — FUROSEMIDE 40 MG PO TABS: 40.0000 mg | ORAL_TABLET | Freq: Two times a day (BID) | ORAL | Status: DC

## 2016-01-19 MED ORDER — SPIRONOLACTONE 25 MG PO TABS
12.5000 mg | ORAL_TABLET | Freq: Every day | ORAL | Status: DC
  Administered 2016-01-19: 12.5 mg via ORAL
  Filled 2016-01-19 (×2): qty 1

## 2016-01-19 NOTE — Progress Notes (Signed)
CARDIAC REHAB PHASE I   PRE:  Rate/Rhythm: 89 SR  BP:  Sitting: 107/89        SaO2: 97 RA  MODE:  Ambulation: 760 ft   POST:  Rate/Rhythm: 99 SR  BP:  Sitting: 108/73         SaO2: 97 RA  Pt ambulated 760 ft on RA, handheld assist, slow, steady gait, tolerated well. Pt c/o mild DOE, standing rest x2, denies any other complaints, states she feels better. Reviewed CHF education (s/s CHF, medication compliance, daily weights, sodium and fluid restrictions); reviewed exercise guidelines. Pt verbalized understanding. Pt states she has been under a lot of stress, states she cannot afford furniture for her apartment, pt states "I can't afford anything." Pt states she sleeps in her bath tub. Pt requesting information regarding potential resources near her, requesting to speak with a social worker prior to discharge. RN notified. Pt to recliner after walk, call bell within reach. Will follow.   [id number] Joylene Grapes, RN, BSN 01/19/2016 2:10 PM

## 2016-01-19 NOTE — Progress Notes (Signed)
Advanced Heart Failure Rounding Note   Subjective:    Admitted with increased dyspnea. 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 No previous ischemic work up.   R/LHC yesterday as below. Normal coronaries and continued volume overload. Depressed cardiac output. Started on dig 0.125 daily.  Started back on 40 mg IV lasix BID.   Feeling " a lot" better.  Produced a lot of urine.  Breathing has improved.  A little sore in her R arm.  Had atypical chest pain, just a "twinge" when lying flat on her back last night. None since.   Out 1.9 L and down 4 lbs.  Creatinine stable.   She is out 12 L thus far this admission, though weight only down 6 lbs from highest.   [facility] 01/18/16  Coronaries: No significant coronary disease. Right Dominance RHC Procedural Findings: Hemodynamics (mmHg) RA mean 15 RV 54/18 PA 61/25, mean 41 PCWP mean 18 LV 101/25 AO 104/84 Oxygen saturations: PA 90% AO 45% Cardiac Output (Fick) 4.11  Cardiac Index (Fick) 1.7  PVR 5.4 WU   Objective:   Weight Range:  Vital Signs:   Temp:  [98.4 F (36.9 C)-98.5 F (36.9 C)] 98.5 F (36.9 C) (05/05 0553) Pulse Rate:  [0-116] 101 (05/05 0553) Resp:  [0-100] 18 (05/05 0553) BP: (75-130)/(48-88) 105/84 mmHg (05/05 0553) SpO2:  [0 %-99 %] 98 % (05/05 0553) Weight:  [326 lb 3.2 oz (147.963 kg)] 326 lb 3.2 oz (147.963 kg) (05/05 0553) Last BM Date: 01/17/16  Weight change: Filed Weights   01/17/16 0720 01/18/16 0608 01/19/16 0553  Weight: 330 lb (149.687 kg) 330 lb (149.687 kg) 326 lb 3.2 oz (147.963 kg)    Intake/Output:   Intake/Output Summary (Last 24 hours) at 01/19/16 0742 Last data filed at 01/19/16 0200  Gross per 24 hour  Intake    100 ml  Output   1500 ml  Net  -1400 ml     Physical Exam: General:  Well appearing. No resp difficulty. In bed.  HEENT: normal Neck: supple. JVP difficult to assess with body habitus appears 9-10 cm . Carotids 2+ bilat; no  bruits. No thyromegaly or nodule noted. Cor: PMI nondisplaced. Regular rate & rhythm. No gallop, 1/6 HSM apex.  Lungs: CTAB, normal effort Abdomen: Obese, soft, NT, ND, no HSM. No bruits or masses. +BS  Extremities: no cyanosis, clubbing, rash, R and LLE trace edema. Mild tenderness to R radial cath site. No edema or bruit noted. Good pulses bilaterally Neuro: alert & orientedx3, cranial nerves grossly intact. moves all 4 extremities w/o difficulty. Affect pleasant  Telemetry: Reviewed personally,  NSR 90s   Labs: Basic Metabolic Panel:  Recent Labs Lab 01/16/16 0228 01/17/16 0334 01/18/16 0358 01/18/16 1049 01/18/16 1456 01/19/16 0526  NA 138 138 137  --  138 139  K 4.1 4.0 3.8  --  4.4 4.1  CL 100* 100* 99*  --  101 103  CO2 29 29 29   --  24 26  GLUCOSE 83 98 91  --  78 86  BUN 16 18 15   --  13 12  CREATININE 1.10* 1.05* 0.98  --  0.95 0.97  CALCIUM 9.0 9.0 8.9  --  9.1 9.1  MG  --   --   --  2.0  --   --     Liver Function Tests:  Recent Labs Lab 01/14/16 1210  AST 25  ALT 21  ALKPHOS 49  BILITOT 2.5*  PROT 7.2  ALBUMIN 3.3*    Recent Labs Lab 01/14/16 1210  LIPASE 37   No results for input(s): AMMONIA in the last 168 hours.  CBC:  Recent Labs Lab 01/14/16 1115 01/18/16 1456  WBC 7.1 5.0  HGB 12.8 13.2  HCT 37.9 40.0  MCV 76.4* 76.3*  PLT 303 281    Cardiac Enzymes:  Recent Labs Lab 01/14/16 1210  TROPONINI <0.03    BNP: BNP (last 3 results)  Recent Labs  01/14/16 1210 01/15/16 0239 01/18/16 0358  BNP 647.9* 578.7* 366.2*    ProBNP (last 3 results) No results for input(s): PROBNP in the last 8760 hours.    Other results:  Imaging: No results found.   Medications:     Scheduled Medications: . aspirin EC  81 mg Oral Daily  . carvedilol  6.25 mg Oral BID WC  . diazepam  2 mg Oral Once  . digoxin  0.125 mg Oral Daily  . furosemide  40 mg Intravenous BID  . heparin  5,000 Units Subcutaneous Q8H  . losartan  12.5 mg  Oral BID  . sodium chloride flush  3 mL Intravenous Q12H  . sodium chloride flush  3 mL Intravenous Q12H  . sodium chloride flush  3 mL Intravenous Q12H    Infusions:    PRN Medications: sodium chloride, sodium chloride, sodium chloride, acetaminophen, albuterol, ondansetron (ZOFRAN) IV, sodium chloride flush, sodium chloride flush, sodium chloride flush   Assessment:   1. 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 She had RHC/LHC 5/5 (had had exertional chest pain).  No coronary disease, ongoing volume overload especially right-sided with low cardiac output. ECHO 01/15/2016 EF 10-15%. Severe MR.  - Volume status up on cath yesterday. Difficult to assess by exam.  Labs stable, and urine output still up.  Lasix 40 mg IV every 8 hrs today, likely transition to po tomorrow anticipating discharge tomorrow or Sunday.  - Continue carvedilol 6.25 mg twice a day.  - Continue losartan 12.5 mg BID. Did not tolerate Entresto due to low BP. - Add spironolactone 12.5 daily.  - Continue digoxin 0.125.  Will need level check in next 1-2 weeks at follow up.   - She understands she needs to prevent pregnancy with HF meds. She is not currently sexually active. Discussed importance of daily weights,  low salt food choices and limiting fluid intake to < 2 liters per day.   2. Snoring- Will need outpatient sleep study.  3. Morbid Obesity- Needs to lose considerable amount of weight. Dietitian meeting with her now.  - Needs to increase activity as able.  4.  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 - Reviewed by Dr Shirlee Latch and MR seems to be related to severe LV dilation.    Likely home tomorrow.  Orders placed for [facility].   Has HF follow up scheduled 5/11 at 1:40 pm.    Length of Stay: 5  Graciella Freer PA-C  01/19/2016, 7:42 AM  Advanced Heart Failure Team Pager [phone] (M-F; 7a - 4p)  Please contact [facility] for  night-coverage after hours (4p -7a ) and weekends on [email or web address]  Patient seen with PA, agree with the above note.  Feels good, diuresed well and weight down.  However, long-term prognosis is guarded with very low EF and low output on RHC yesterday.  She also has secondary severe MR.  - Needs one more day of IV diuresis, transition to po  tomorrow as above.  - Now on digoxin.  Continue current Coreg and losartan. Will add spironolactone 12.5 daily.  Careful with med titration as BP is soft at times.  - She is going to need close followup in CHF clinic and aggressive med titration.  Poor candidate for transplant at this point given weight and social situation (single mother, limited social network).    Marca Ancona 01/19/2016 8:59 AM

## 2016-01-20 ENCOUNTER — Encounter (HOSPITAL_COMMUNITY): Payer: Self-pay | Admitting: Physician Assistant

## 2016-01-20 DIAGNOSIS — I340 Nonrheumatic mitral (valve) insufficiency: Secondary | ICD-10-CM | POA: Diagnosis present

## 2016-01-20 LAB — BASIC METABOLIC PANEL
ANION GAP: 10 (ref 5–15)
BUN: 11 mg/dL (ref 6–20)
CHLORIDE: 102 mmol/L (ref 101–111)
CO2: 27 mmol/L (ref 22–32)
Calcium: 9.1 mg/dL (ref 8.9–10.3)
Creatinine, Ser: 0.87 mg/dL (ref 0.44–1.00)
GFR calc Af Amer: >60 mL/min (ref >60)
GLUCOSE: 92 mg/dL (ref 65–99)
POTASSIUM: 3.7 mmol/L (ref 3.5–5.1)
Sodium: 139 mmol/L (ref 135–145)

## 2016-01-20 MED ORDER — CARVEDILOL 6.25 MG PO TABS: 6.2500 mg | ORAL_TABLET | Freq: Two times a day (BID) | ORAL | Status: DC

## 2016-01-20 MED ORDER — FUROSEMIDE 40 MG PO TABS: 40.0000 mg | ORAL_TABLET | Freq: Two times a day (BID) | ORAL | Status: DC

## 2016-01-20 MED ORDER — LOSARTAN POTASSIUM 25 MG PO TABS: 12.5000 mg | ORAL_TABLET | Freq: Two times a day (BID) | ORAL | Status: DC

## 2016-01-20 MED ORDER — DIGOXIN 125 MCG PO TABS: 0.1250 mg | ORAL_TABLET | Freq: Every day | ORAL | Status: DC

## 2016-01-20 NOTE — Discharge Summary (Signed)
Discharge Summary    [name],  MRN: [id number], DOB/AGE: 33/14/1982 33 y.o.  Admit date: 01/14/2016 Discharge date: 01/20/2016  Primary Care Provider: No primary care provider on file. Primary Cardiologist: Dr. 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 female with a past medical history significant for what is thought to be a postpartum cardiomyopathy who presented to [facility] on 01/14/16 with dyspnea and found to be in acute on chronic systolic CHF.   Admitted with increased dyspnea. 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 No previous ischemic work up. She is followed by Dr. Tomie China at [facility] in [place]. She last saw him in the office in February at which time he was going to repeat an echocardiogram and had a discussion about possible AICD or LifeVest with her which she was refusing. Her last echocardiogram with [facility] in December 2016 showed severely dilated LV and global hypokinesis with EF of 20-25%, moderate MR, mild TR and moderate to severe pulmonary hypertension.   She presented here on 01/14/16  with one week of worsening shortness of breath, orthopnea and worsening lower extremity edema.  Hospital Course     Consultants: surgery  1. 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 She had RHC/LHC 5/5 (had had exertional chest pain). No coronary disease, ongoing volume overload especially right-sided with low cardiac output. ECHO 01/15/2016 EF 10-15%. Severe MR.  - Volume status up on cath 01/18/16.  Difficult to assess by exam. Labs stable, and urine output still up. Placed on IV lasix. she is negative yesterday, negative 12.6 liters since admission. She is on lasix IV 40mg  bid, renal function remains stable. Today converted to PO Lasix 40mg  BID.   - Continue carvedilol 6.25 mg BID, losartan 12.5 mg BID. Did not tolerate Entresto due to low BP. Spironolactone 12.5 daily was discontinued due to soft BPs. Continue digoxin 0.125. Will need level check in next 1-2 weeks at follow up.  - She understands she needs to prevent pregnancy with HF meds. She is not currently sexually active. Discussed importance of daily weights, low salt food choices and limiting fluid intake to < 2 liters per day.   2. Snoring: Will need outpatient sleep study.  3. Morbid Obesity: Needs to lose considerable amount of weight.   4. Severe MR- malcoaptation of the valve leaflets, suspect related to severe LV dilation.  5. Epigastric pain: surgery was consulted due to abnormal CT. [place] and HIDA were normal. They did not see a surgical indication for cholecystectomy.   The patient has had an uncomplicated hospital course and is recovering well. The radial catheter site is stable. She has been seen by Dr. Wyline Mood today and deemed ready for discharge home. All follow-up appointments have been scheduled. Discharge medications are listed below.  _____________  Discharge Vitals Blood pressure 88/57, pulse 92, temperature 97.1 F (36.2 C), temperature source Oral, resp. rate 20, height 5\' 4"  (1.626 m), weight 320 lb 4.8 oz (145.287 kg),  last menstrual period 12/20/2015, SpO2 96 %.  Filed Weights   01/18/16 0608 01/19/16 0553 01/20/16 0543  Weight: 330 lb (149.687 kg) 326 lb 3.2 oz (147.963 kg) 320 lb 4.8 oz (145.287 kg)    Labs & Radiologic Studies     CBC  Recent Labs  01/18/16 1456 01/19/16 0857  WBC 5.0 5.8  HGB 13.2 13.4  HCT 40.0 40.3  MCV 76.3* 77.2*  PLT 281 284   Basic Metabolic  Panel  Recent Labs  36/64/40 1049  01/19/16 0526 01/20/16 0529  NA  --   < > 139 139  K  --   < > 4.1 3.7  CL  --   < > 103 102  CO2  --   < > 26 27  GLUCOSE  --   < > 86 92  BUN  --   < > 12 11  CREATININE  --   < > 0.97 0.87  CALCIUM  --   < > 9.1 9.1  MG 2.0  --   --   --   < > = values in this interval not displayed.   Dg Chest 2 View  01/14/2016  CLINICAL DATA:  SOB and abdominal pain x 2 weeks. She states that today it was much worst. Hx of congestive heart failure, ex-smoker, asthma, obesity. EXAM: CHEST  2 VIEW COMPARISON:  None. FINDINGS: There is moderate to severe cardiac silhouette enlargement. There is mild diffuse interstitial prominence with mild azygos vein distension. No pleural effusion. IMPRESSION: Findings suggest congestive heart failure with mild interstitial edema. Electronically Signed   By: Esperanza Heir M.D.   On: 01/14/2016 12:17   Ct Angio Chest Pe W/cm &/or Wo Cm  01/14/2016  CLINICAL DATA:  Shortness of breath, epigastric abdominal pain for 2 weeks. EXAM: CT ANGIOGRAPHY CHEST CT ABDOMEN AND PELVIS WITH CONTRAST TECHNIQUE: Multidetector CT imaging of the chest was performed using the standard protocol during bolus administration of intravenous contrast. Multiplanar CT image reconstructions and MIPs were obtained to evaluate the vascular anatomy. Multidetector CT imaging of the abdomen and pelvis was performed using the standard protocol during bolus administration of intravenous contrast. CONTRAST:  100 mL of Isovue 370 intravenously. COMPARISON:  Chest radiograph of same day. FINDINGS: CTA CHEST FINDINGS No pneumothorax or pleural effusion is noted. No acute pulmonary disease is noted. Mild cardiomegaly is noted. There is no evidence of thoracic aortic aneurysm. There is no evidence of pulmonary embolus. No mediastinal mass or adenopathy is noted. No significant osseous abnormality is noted in the chest. CT ABDOMEN and PELVIS FINDINGS No significant osseous  abnormality is noted. Fatty infiltration of the liver is noted. The spleen and pancreas are unremarkable. Adrenal glands and kidneys are unremarkable. No hydronephrosis or renal obstruction is noted. Mild gallbladder wall thickening is noted with probable mild pericholecystic fluid concerning for cholecystitis. No gallstones are noted. Small amount of fluid is noted around the lateral portion of the right hepatic lobe. There is no evidence of bowel obstruction. The appendix appears normal. Urinary bladder appears normal. Uterus and ovaries appear unremarkable. Mild amount of free fluid is noted in the right lower quadrant of unknown etiology. No significant adenopathy is noted. Review of the MIP images confirms the above findings. IMPRESSION: No evidence of pulmonary embolus. Mild cardiomegaly is noted. No acute abnormality seen in the chest. Fatty infiltration of the liver. Mild gallbladder wall thickening is noted with probable pericholecystic fluid concerning for acute cholecystitis. No gallstones are noted. HIDA  scan may be performed further evaluation. Mild amount of free fluid is noted in the right lower quadrant of unknown etiology. Possible ruptured ovarian cyst cannot be excluded. Pelvic ultrasound may be performed for further evaluation. The appendix appears normal. Electronically Signed   By: Lupita Raider, M.D.   On: 01/14/2016 15:38   Nm Hepatobiliary Liver Func  01/16/2016  CLINICAL DATA:  Right upper quadrant abdominal pain. EXAM: NUCLEAR MEDICINE HEPATOBILIARY IMAGING TECHNIQUE: Sequential images of the abdomen were obtained out to 60 minutes following intravenous administration of radiopharmaceutical. RADIOPHARMACEUTICALS:  5.0 mCi Tc-63m  Choletec IV COMPARISON:  Ultrasound and CT scan of January 14, 2016. FINDINGS: Prompt uptake and biliary excretion of activity by the liver is seen. Gallbladder activity is visualized, consistent with patency of cystic duct. Biliary activity passes into small  bowel, consistent with patent common bile duct. IMPRESSION: Normal and timely filling of gallbladder is noted. There is no scintigraphic evidence of cystic duct obstruction or cholecystitis. Electronically Signed   By: Lupita Raider, M.D.   On: 01/16/2016 09:19   Ct Abdomen Pelvis W Contrast  01/14/2016  CLINICAL DATA:  Shortness of breath, epigastric abdominal pain for 2 weeks. EXAM: CT ANGIOGRAPHY CHEST CT ABDOMEN AND PELVIS WITH CONTRAST TECHNIQUE: Multidetector CT imaging of the chest was performed using the standard protocol during bolus administration of intravenous contrast. Multiplanar CT image reconstructions and MIPs were obtained to evaluate the vascular anatomy. Multidetector CT imaging of the abdomen and pelvis was performed using the standard protocol during bolus administration of intravenous contrast. CONTRAST:  100 mL of Isovue 370 intravenously. COMPARISON:  Chest radiograph of same day. FINDINGS: CTA CHEST FINDINGS No pneumothorax or pleural effusion is noted. No acute pulmonary disease is noted. Mild cardiomegaly is noted. There is no evidence of thoracic aortic aneurysm. There is no evidence of pulmonary embolus. No mediastinal mass or adenopathy is noted. No significant osseous abnormality is noted in the chest. CT ABDOMEN and PELVIS FINDINGS No significant osseous abnormality is noted. Fatty infiltration of the liver is noted. The spleen and pancreas are unremarkable. Adrenal glands and kidneys are unremarkable. No hydronephrosis or renal obstruction is noted. Mild gallbladder wall thickening is noted with probable mild pericholecystic fluid concerning for cholecystitis. No gallstones are noted. Small amount of fluid is noted around the lateral portion of the right hepatic lobe. There is no evidence of bowel obstruction. The appendix appears normal. Urinary bladder appears normal. Uterus and ovaries appear unremarkable. Mild amount of free fluid is noted in the right lower quadrant of  unknown etiology. No significant adenopathy is noted. Review of the MIP images confirms the above findings. IMPRESSION: No evidence of pulmonary embolus. Mild cardiomegaly is noted. No acute abnormality seen in the chest. Fatty infiltration of the liver. Mild gallbladder wall thickening is noted with probable pericholecystic fluid concerning for acute cholecystitis. No gallstones are noted. HIDA scan may be performed further evaluation. Mild amount of free fluid is noted in the right lower quadrant of unknown etiology. Possible ruptured ovarian cyst cannot be excluded. Pelvic ultrasound may be performed for further evaluation. The appendix appears normal. Electronically Signed   By: Lupita Raider, M.D.   On: 01/14/2016 15:38   US Abdomen Limited Ruq  01/14/2016  CLINICAL DATA:  35 year old female with history of right upper quadrant abdominal pain for the past 3 weeks. EXAM: US ABDOMEN LIMITED - RIGHT UPPER QUADRANT COMPARISON:  No priors. FINDINGS: Gallbladder: No gallstones or wall thickening visualized. No sonographic Eulah Pont  sign noted by sonographer. Common bile duct: Diameter: 3.4 mm Liver: No focal lesion identified. Within normal limits in parenchymal echogenicity. IMPRESSION: 1. No acute findings. Specifically, no gallstones or findings to suggest an acute cholecystitis at this time. Electronically Signed   By: Trudie Reed M.D.   On: 01/14/2016 17:18     Diagnostic Studies/Procedures    [facility] 01/18/16  Procedures    Right/Left Heart Cath and Coronary Angiography    Conclusion    1. 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  2. Filling pressures are still elevated. PCWP only mildly elevated but LVEDP more significantly elevated. High right heart filling pressures.  - Restart Lasix 40 mg IV bid.  3. Low cardiac output. I will start digoxin 0.125 mg daily. She may need advanced therapies in the future. Will need very close followup.   Coronaries: No significant  coronary disease. Right Dominance RHC Procedural Findings: Hemodynamics (mmHg) RA mean 15 RV 54/18 PA 61/25, mean 41 PCWP mean 18 LV 101/25 AO 104/84 Oxygen saturations: PA 90% AO 45% Cardiac Output (Fick) 4.11  Cardiac Index (Fick) 1.7  PVR 5.4 WU    2D ECHO: 01/15/2016 LV EF: 10% - 15% Study Conclusions - Left ventricle: The cavity size was severely dilated. Wall  thickness was normal. Systolic function was severely reduced. The  estimated ejection fraction was in the range of 10% to 15%.  Diffuse hypokinesis. Due to tachycardia, there was fusion of  early and atrial contributions to ventricular filling. The study  is not technically sufficient to allow evaluation of LV diastolic  function. - Mitral valve: There was tenting and malcoaptation of the valve  leaflets. There was severe regurgitation directed centrally. The  acceleration rate of the regurgitant jet was reduced, consistent  with a low dP/dt. Severe regurgitation is suggested by pulmonary  vein systolic flow reversal. - Left atrium: The atrium was mildly dilated. - Right ventricle: Systolic function was mildly reduced. - Tricuspid valve: There was mild-moderate regurgitation directed  centrally. - Pulmonary arteries: Systolic pressure was mildly increased. PA  peak pressure: 41 mm Hg (S). _____________    Disposition   Pt is being discharged home today in good condition.  Follow-up Plans & Appointments    Follow-up Information    Follow up with [facility].   Why:  They will do your home health care at your home   Contact information:   [address] [place] [address] [phone]       Follow up with [facility].   Specialty:  Cardiology   Why:  at 140 for post hospital follow up. Please bring all of your medications to your visit. The code for parking is 0002 for May.    Contact information:   [address] [id number] mc [place] [address] [phone]     Discharge Instructions    Amb Referral to Cardiac Rehabilitation    Complete by:  As directed   Diagnosis:  Heart Failure (see criteria below if ordering Phase II) Comment - to   Heart Failure Type:  Chronic Systolic & Diastolic           Discharge Medications   Current Discharge Medication List    START taking these medications   Details  carvedilol (COREG) 6.25 MG tablet Take 1 tablet (6.25 mg total) by mouth 2 (two) times daily with a meal. Qty: 60 tablet, Refills: 6    digoxin (LANOXIN) 0.125 MG tablet  Take 1 tablet (0.125 mg total) by mouth daily. Qty: 30 tablet, Refills: 1    losartan (COZAAR) 25 MG tablet Take 0.5 tablets (12.5 mg total) by mouth 2 (two) times daily. Qty: 60 tablet, Refills: 3      CONTINUE these medications which have CHANGED   Details  furosemide (LASIX) 40 MG tablet Take 1 tablet (40 mg total) by mouth 2 (two) times daily. Qty: 60 tablet, Refills: 3      CONTINUE these medications which have NOT CHANGED   Details  albuterol (PROAIR HFA) 108 (90 BASE) MCG/ACT inhaler Inhale 2 puffs into the lungs every 6 (six) hours as needed for wheezing or shortness of breath.      STOP taking these medications     metoprolol tartrate (LOPRESSOR) 25 MG tablet      valsartan (DIOVAN) 160 MG tablet           Outstanding Labs/Studies   BMET, Digoxin level    Duration of Discharge Encounter   Greater than 30 minutes including physician time.  Byrd Hesselbach R PA-C 01/20/2016, 11:34 AM

## 2016-01-20 NOTE — Progress Notes (Signed)
[name], female   DOB: [birth year], 35 y.o.   MRN: [id number]    Subjective SOB has resolved. Ambulating in her room and halls without troubles.    No Known Allergies  Medications Scheduled Medications: . aspirin EC  81 mg Oral Daily  . carvedilol  6.25 mg Oral BID WC  . diazepam  2 mg Oral Once  . digoxin  0.125 mg Oral Daily  . furosemide  40 mg Oral BID  . heparin  5,000 Units Subcutaneous Q8H  . losartan  12.5 mg Oral BID  . sodium chloride flush  3 mL Intravenous Q12H  . sodium chloride flush  3 mL Intravenous Q12H  . sodium chloride flush  3 mL Intravenous Q12H  . spironolactone  12.5 mg Oral Daily     Infusions:     PRN Medications:  sodium chloride, sodium chloride, sodium chloride, acetaminophen, albuterol, ondansetron (ZOFRAN) IV, sodium chloride flush, sodium chloride flush, sodium chloride flush   Past Medical History  Diagnosis Date  . Hearing loss     bilateral  . Asthma   . Heart failure (HCC)   . Obesity   . CHF (congestive heart failure) (HCC)   . Shortness of breath dyspnea     Past Surgical History  Procedure Laterality Date  . Cesarean section    . Tonsillectomy    . Cardiac catheterization N/A 01/18/2016    Procedure: Right/Left Heart Cath and Coronary Angiography;  Surgeon: Laurey Morale, MD;  Location: [facility] INVASIVE CV LAB;  Service: Cardiovascular;  Laterality: N/A;    Family History  Problem Relation Age of Onset  . Heart attack Mother   . Hyperlipidemia Father   . Diabetes Brother     Social History [name] reports that she quit smoking about 3 months ago. She has never used smokeless tobacco. [name] reports that she drinks alcohol.  Review of Systems CONSTITUTIONAL: No weight loss, fever, chills, weakness or fatigue.  HEENT: Eyes: No visual loss, blurred vision, double vision or yellow sclerae. No hearing loss, sneezing, congestion, runny nose or sore throat.  SKIN: No rash or itching.    CARDIOVASCULAR: no chest pain no palpitations.  RESPIRATORY: No shortness of breath, cough or sputum.  GASTROINTESTINAL: No anorexia, nausea, vomiting or diarrhea. No abdominal pain or blood.  GENITOURINARY: no polyuria, no dysuria NEUROLOGICAL: No headache, dizziness, syncope, paralysis, ataxia, numbness or tingling in the extremities. No change in bowel or bladder control.  MUSCULOSKELETAL: No muscle, back pain, joint pain or stiffness.  HEMATOLOGIC: No anemia, bleeding or bruising.  LYMPHATICS: No enlarged nodes. No history of splenectomy.  PSYCHIATRIC: No history of depression or anxiety.      Physical Examination Blood pressure 88/57, pulse 92, temperature 97.1 F (36.2 C), temperature source Oral, resp. rate 20, height  (1.626 m), weight 320 lb 4.8 oz (145.287 kg), last menstrual period 12/20/2015, SpO2 96 %.  Intake/Output Summary (Last 24 hours) at 01/20/16 1048 Last data filed at 01/20/16 0849  Gross per 24 hour  Intake   1080 ml  Output   1800 ml  Net   -720 ml    HEENT: sclera clear, throat clear  Cardiovascular: RRR, no m/r/g, no jvd  Respiratory: CTAB  GI: abdomen soft, NT, ND  MSK: no LE edema  Neuro: no focal deficits  Psych: appropriate affect   Lab Results  Basic Metabolic Panel:  Recent Labs Lab 01/17/16 0334 01/18/16 0358 01/18/16 1049 01/18/16 1456 01/19/16 0526 01/20/16 1324  NA 138 137  --  138 139 139  K 4.0 3.8  --  4.4 4.1 3.7  CL 100* 99*  --  101 103 102  CO2 29 29  --  24 26 27   GLUCOSE 98 91  --  78 86 92  BUN 18 15  --  13 12 11   CREATININE 1.05* 0.98  --  0.95 0.97 0.87  CALCIUM 9.0 8.9  --  9.1 9.1 9.1  MG  --   --  2.0  --   --   --     Liver Function Tests:  Recent Labs Lab 01/14/16 1210  AST 25  ALT 21  ALKPHOS 49  BILITOT 2.5*  PROT 7.2  ALBUMIN 3.3*    CBC:  Recent Labs Lab 01/14/16 1115 01/18/16 1456 01/19/16 0857  WBC 7.1 5.0 5.8  HGB 12.8 13.2 13.4  HCT 37.9 40.0 40.3  MCV 76.4* 76.3*  77.2*  PLT 303 281 284    Cardiac Enzymes:  Recent Labs Lab 01/14/16 1210  TROPONINI <0.03    BNP: Invalid input(s): POCBNP     Impression/Recommendations 1. Acute on chronic systolic HF - 01/2016 echo LVEF 10-15%, cannot evaluate diastolic function, severe TR due to poor leaflet coaptation/functional MR,  - cath 01/18/2016 no obstructive CAD. PCWP 18, mean PA 41, CI 1.7 - medical therapy with coreg, losartan digoxin, aldactone.  - she is negative yesterday, negative 12.6 liters since admission. She is on lasix IV 40mg  bid, renal function remains stable.  - some low bp's this AM which she has a history of, we will d/c her aldactone which was just started yesterday.  - needs outpatient OSA evaluation - per CHF plan to change to lasix today 40mg  bid. She has been ambulating without significant symptoms, we will plan for discharge today with close f/u with CHF clinic  2. 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    Plan for discharge today. She will need close f/u in CHF clinic 1-2 weeks after discharge.   Dina Rich, M.D.

## 2016-01-20 NOTE — Progress Notes (Signed)
Patient BP this am was 118/57, HR.93  was able to give Coreg, at 10 am BP down to 88/57 and pt. C/o weakness/tired. NP notified, NP instruct to hold Lasix, Losartan, and spironolactone now ,recheck the BP if SBP. above 90 to give lasix. Will continue to monitor patient.

## 2016-01-24 ENCOUNTER — Telehealth (HOSPITAL_COMMUNITY): Payer: Self-pay | Admitting: Surgery

## 2016-01-24 DIAGNOSIS — I5021 Acute systolic (congestive) heart failure: Secondary | ICD-10-CM | POA: Diagnosis not present

## 2016-01-24 NOTE — Telephone Encounter (Signed)
Heart Failure Nurse Navigator Post Discharge Telephone Call  I called to remind patient of upcoming [facility] Clinic appt and to see how she has been since hospitalization.  I left message and will plan to check on her during AHF Clinic appt tomorrow.

## 2016-01-25 ENCOUNTER — Telehealth (HOSPITAL_COMMUNITY): Payer: Self-pay

## 2016-01-25 ENCOUNTER — Ambulatory Visit (HOSPITAL_COMMUNITY)
Admit: 2016-01-25 | Discharge: 2016-01-25 | Disposition: A | Discharge status: Home / Self Care | Payer: Medicaid Other | Source: Ambulatory Visit | Attending: Internal Medicine | Admitting: Internal Medicine

## 2016-01-25 DIAGNOSIS — Z833 Family history of diabetes mellitus: Secondary | ICD-10-CM | POA: Diagnosis not present

## 2016-01-25 DIAGNOSIS — Z8249 Family history of ischemic heart disease and other diseases of the circulatory system: Secondary | ICD-10-CM | POA: Diagnosis not present

## 2016-01-25 DIAGNOSIS — I5022 Chronic systolic (congestive) heart failure: Secondary | ICD-10-CM

## 2016-01-25 DIAGNOSIS — Z79899 Other long term (current) drug therapy: Secondary | ICD-10-CM | POA: Diagnosis not present

## 2016-01-25 DIAGNOSIS — J45909 Unspecified asthma, uncomplicated: Secondary | ICD-10-CM | POA: Diagnosis not present

## 2016-01-25 DIAGNOSIS — Z720 Tobacco use: Secondary | ICD-10-CM

## 2016-01-25 DIAGNOSIS — I5043 Acute on chronic combined systolic (congestive) and diastolic (congestive) heart failure: Secondary | ICD-10-CM | POA: Insufficient documentation

## 2016-01-25 DIAGNOSIS — Z87891 Personal history of nicotine dependence: Secondary | ICD-10-CM | POA: Diagnosis not present

## 2016-01-25 DIAGNOSIS — H9193 Unspecified hearing loss, bilateral: Secondary | ICD-10-CM | POA: Diagnosis not present

## 2016-01-25 DIAGNOSIS — I340 Nonrheumatic mitral (valve) insufficiency: Secondary | ICD-10-CM

## 2016-01-25 DIAGNOSIS — R0683 Snoring: Secondary | ICD-10-CM | POA: Diagnosis not present

## 2016-01-25 DIAGNOSIS — I428 Other cardiomyopathies: Secondary | ICD-10-CM

## 2016-01-25 DIAGNOSIS — I429 Cardiomyopathy, unspecified: Secondary | ICD-10-CM

## 2016-01-25 MED ORDER — LOSARTAN POTASSIUM 25 MG PO TABS: 25.0000 mg | ORAL_TABLET | Freq: Every day | ORAL | Status: DC

## 2016-01-25 MED ORDER — LISINOPRIL 5 MG PO TABS: 5.0000 mg | ORAL_TABLET | Freq: Every day | ORAL | Status: DC

## 2016-01-25 NOTE — Progress Notes (Signed)
[name], female   DOB: 02/03/1981, 34 y.o.   MRN: [id number] PCP: None  Primary HF Cardiologist: Dr McLean   HPI: Kirsten Wheeler is a 34 y.o. 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 Diuresed with IV lasix and transitioned to lasix 40 mg twice a day. Had LHC with normal cors. Intolerant entresto due to hypotension. Discharge weight 320 pounds.   Today she presents for post hospital follow up. Overall feeling ok. Denies SOB/PND/Orthopnea. Says she has been weighing at home however [facility] RN reports she is not weighing. Not doing much at home. Tries to follow low salt diet. [facility] following and assisting with home medications. Lives with 2 small children. Has difficulty with transportation. Can use [facility] to get to appointments.    LHC/RHC 01/18/2016  Coronaries: No significant coronary disease. Right Dominance RHC Procedural Findings: Hemodynamics (mmHg) RA mean 15 RV 54/18 PA 61/25, mean 41 PCWP mean 18 LV 101/25 AO 104/84 Oxygen saturations: PA 90% AO 45% Cardiac Output (Fick) 4.11  Cardiac Index (Fick) 1.7  PVR 5.4 WU  Labs 01/20/2016: K 3.7 creatinine 0.87    ROS: All systems negative except as listed in HPI, PMH and Problem List.  SH:  Social History   Social History  . Marital Status: Single    Spouse Name: N/A  . Number of Children: N/A  . Years of Education: N/A   Occupational History  . Not on file.   Social History Main Topics  . Smoking status: Former Smoker    Quit date: 10/19/2015  . Smokeless tobacco: Never Used  . Alcohol Use: 0.0 oz/week    0 Standard drinks or equivalent per week     Comment: occasionally  . Drug Use: Yes    Special: Marijuana     Comment: only socially  . Sexual Activity: Not on file   Other Topics Concern  . Not on file   Social History Narrative    FH:   Family History  Problem Relation Age of Onset  . Heart attack Mother   . Hyperlipidemia Father   . Diabetes Brother     Past Medical History  Diagnosis Date  . Hearing loss     bilateral  . Asthma   . Chronic systolic CHF (congestive heart failure) (HCC)     a. ECHO 01/15/2016 EF 10-15%. 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  . Morbid obesity (HCC)   . Snoring   . Mitral regurgitation     a. severe, felt to be functional 2/2 LV dilation     Current Outpatient Prescriptions  Medication Sig Dispense Refill  . albuterol (PROAIR HFA) 108 (90 BASE) MCG/ACT inhaler Inhale 2 puffs into the lungs every 6 (six) hours as needed for wheezing or shortness of breath.    . carvedilol (COREG) 6.25 MG tablet Take 1 tablet (6.25 mg total) by mouth 2 (two) times daily with a meal. 60 tablet 6  . digoxin (LANOXIN) 0.125 MG tablet Take 1 tablet (0.125 mg total) by mouth daily. 30 tablet 1  . furosemide (LASIX) 40 MG tablet Take 1 tablet (40 mg total) by mouth 2 (two) times daily. 60 tablet 3  . losartan (COZAAR) 25 MG tablet Take 0.5 tablets (12.5 mg total) by mouth 2 (two) times daily. 60 tablet 3     No current facility-administered medications for this encounter.    Filed Vitals:   01/25/16 1446  BP: 130/86  Pulse: 112  Weight: 320 lb 6.4 oz (145.332 kg)  SpO2: 96%    PHYSICAL EXAM: General:  Well appearing. No resp difficulty. Partner and 2 daughters present HEENT: normal Neck: supple. JVP 6-7. Difficulty to assess due to body habitus.  Carotids 2+ bilaterally; no bruits. No lymphadenopathy or thryomegaly appreciated. Cor: PMI normal. Regular rate & rhythm. No rubs, or murmurs. + S3  Lungs: clear Abdomen: Obese, soft, nontender, nondistended. No hepatosplenomegaly. No bruits or masses. Good bowel sounds. Extremities: no cyanosis, clubbing, rash, edema Neuro: alert & orientedx3, cranial nerves grossly intact. Moves all 4 extremities w/o difficulty. Affect pleasant.  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 LHC ok. Cor ok. NICM ECHO 01/15/2016 EF 10-15%. Severe MR. Plan to repeat ECHO after HF meds optimized for 3 months.  NYHA IIIB .Volume status stable. Continue lasix 40 mg twice a day.  Continue carvedilol 6.25 mg twice a day. Continue digoxin 0.125 mg daily.  Intolerant lisinopril due to cough/dizziness.  Intolerant entresto due to hypotension. Start back on losartan 25 mg daily.   Add spiro next visit.   She understands she needs to prevent pregnancy with HF meds. She is not currently sexually active. Discussed low salt food choices and limiting fluid intake to < 2 liters per day.  2. Snoring- Will need outpatient sleep study.  3. Morbid Obesity- Needs to lose considerable amount of weight. Dietitian meeting with her now.  4. 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 Central MR No stenosis.  Thought to be from severe LV dysfunction.  5. Tobacco Abuse- Counseled to stop smoking.   Today she met with HF SW and HF Navigator. She is at high risk for readmit due to poor insight and difficulty taking medications. Needs to continue [facility].   Follow up in 2 weeks and plan to check dig level and BMET. Anticipate adding spiro next visit and increasing bb.  Kirsten Gumina NP-C  4:09 PM

## 2016-01-25 NOTE — Progress Notes (Signed)
CSW met with patient in the clinic. She was recently hospitalized and is here in the clinic for post hospital follow up. She resides in [place] with her 2 daughters 33yo and 58yo.  She has no family locally but reports her father in [place]. has been supportive and assisted with financial help as well. Patient has a partner Kirsten Wheeler who lives 3 hours away but visits frequently and assists with care when possible. Patient states she has medicaid, food stamps and subsidized housing.  She has been trying to weigh daily and track. She appears to have a good understanding of low salt diet and making adjustments to her diet. She met with pharmacist during clinic appointment and denies any concerns or questions about medications. She reports she has access to medical transport through her medicaid benefit and her partner Kirsten Wheeler transports at other times. CSW and patient discussed importance of calling clinic with questions, concerns and weight gain according to zone sheet provided. Patient identified the only obstacle is lack of income but reports she has a plan and working on obtaining her child support. CSW provided supportive intervention and coping skills. Patient appears motivated for improved health. CSW will be available for additional needs as they arise. Kirsten Sarna, LCSW [phone]

## 2016-01-25 NOTE — Telephone Encounter (Signed)
[facility] RN called to report since discharge from hospital, patient has not been weighing. Also, patient has not been taking Losartan because she never went to pick it up (states pharmacy never filled it). Will send refill, RN to educate on importance of daily weight regimen and compliance with meds and diet. Patient has apt with CHF clinic today.  [name]

## 2016-01-25 NOTE — Patient Instructions (Addendum)
START Losartan 25 mg, one tab daily  Your physician recommends that you schedule a follow-up appointment in: 2 weeks with [name] Clegg,NP In the [facility]   Do the following things EVERYDAY: 1) Weigh yourself in the morning before breakfast. Write it down and keep it in a log. 2) Take your medicines as prescribed 3) Eat low salt foods-Limit salt (sodium) to 2000 mg per day.  4) Stay as active as you can everyday 5) Limit all fluids for the day to less than 2 liters 6)

## 2016-02-05 ENCOUNTER — Telehealth (HOSPITAL_COMMUNITY): Payer: Self-pay | Admitting: Pharmacist

## 2016-02-05 NOTE — Telephone Encounter (Signed)
Losartan 25 mg PA approved by [place] Medicaid from 01/25/16 through 01/19/17.   [name], PharmD, BCPS, CPP Clinical Pharmacist Pager: [phone] Phone: [phone] 02/05/2016 12:01 PM

## 2016-02-08 ENCOUNTER — Inpatient Hospital Stay (HOSPITAL_COMMUNITY): Admission: RE | Admit: 2016-02-08 | Payer: Medicaid Other | Source: Ambulatory Visit

## 2016-02-20 ENCOUNTER — Ambulatory Visit (HOSPITAL_COMMUNITY)
Admission: RE | Admit: 2016-02-20 | Discharge: 2016-02-20 | Disposition: A | Discharge status: Home / Self Care | Payer: Medicaid Other | Source: Ambulatory Visit | Attending: Cardiology | Admitting: Cardiology

## 2016-02-20 DIAGNOSIS — Z87891 Personal history of nicotine dependence: Secondary | ICD-10-CM | POA: Diagnosis not present

## 2016-02-20 DIAGNOSIS — J45909 Unspecified asthma, uncomplicated: Secondary | ICD-10-CM | POA: Insufficient documentation

## 2016-02-20 DIAGNOSIS — Z9189 Other specified personal risk factors, not elsewhere classified: Secondary | ICD-10-CM | POA: Diagnosis not present

## 2016-02-20 DIAGNOSIS — H9193 Unspecified hearing loss, bilateral: Secondary | ICD-10-CM | POA: Insufficient documentation

## 2016-02-20 DIAGNOSIS — Z79899 Other long term (current) drug therapy: Secondary | ICD-10-CM | POA: Diagnosis not present

## 2016-02-20 DIAGNOSIS — I5022 Chronic systolic (congestive) heart failure: Secondary | ICD-10-CM | POA: Diagnosis not present

## 2016-02-20 DIAGNOSIS — Z8249 Family history of ischemic heart disease and other diseases of the circulatory system: Secondary | ICD-10-CM | POA: Diagnosis not present

## 2016-02-20 DIAGNOSIS — R0683 Snoring: Secondary | ICD-10-CM | POA: Diagnosis not present

## 2016-02-20 DIAGNOSIS — I428 Other cardiomyopathies: Secondary | ICD-10-CM

## 2016-02-20 DIAGNOSIS — Z833 Family history of diabetes mellitus: Secondary | ICD-10-CM | POA: Diagnosis not present

## 2016-02-20 DIAGNOSIS — I429 Cardiomyopathy, unspecified: Secondary | ICD-10-CM | POA: Diagnosis not present

## 2016-02-20 DIAGNOSIS — I340 Nonrheumatic mitral (valve) insufficiency: Secondary | ICD-10-CM | POA: Insufficient documentation

## 2016-02-20 MED ORDER — SPIRONOLACTONE 25 MG PO TABS: 12.5000 mg | ORAL_TABLET | Freq: Every day | ORAL | Status: DC

## 2016-02-20 NOTE — Patient Instructions (Signed)
START Spironolactone 25mg , one half tab daily  Your physician recommends that you schedule a follow-up appointment in: 1 week with our CHF pharmacist and 4 weeks with Tonye Becket, NP In [facility]  Do the following things EVERYDAY: 1) Weigh yourself in the morning before breakfast. Write it down and keep it in a log. 2) Take your medicines as prescribed 3) Eat low salt foods-Limit salt (sodium) to 2000 mg per day.  4) Stay as active as you can everyday 5) Limit all fluids for the day to less than 2 liters 6)

## 2016-02-20 NOTE — Progress Notes (Signed)
Patient ID: Letizia Boxley, female   DOB: 1980-12-19, 35 y.o.   MRN: [id number] PCP: None  Primary HF Cardiologist: Dr Shirlee Latch   HPI: Marilyne is a 35 y.o. 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 Diuresed with IV lasix and transitioned to lasix 40 mg twice a day. Had LHC with normal cors. Intolerant entresto due to hypotension. Discharge weight 320 pounds.   Today she presents for follow up. Last visit losartan was started. Overall feeling ok. Denies SOB/PND/Orthopnea. Mild dyspnea with hills. Weight at home 318-321 pounds. Walking around more at home. Tries to follow low salt diet. [facility] following and assisting with home medications. Lives with 2 small children. Has difficulty with transportation. Can use [facility] to get to appointments.  Ran out of lasix for a few days and unable to pick up due to cost.   LHC/RHC 01/18/2016  Coronaries: No significant coronary disease. Right Dominance RHC Procedural Findings: Hemodynamics (mmHg) RA mean 15 RV 54/18 PA 61/25, mean 41 PCWP mean 18 LV 101/25 AO 104/84 Oxygen saturations: PA 90% AO 45% Cardiac Output (Fick) 4.11  Cardiac Index (Fick) 1.7  PVR 5.4 WU  Labs 01/20/2016: K 3.7 creatinine 0.87    ROS: All systems negative except as listed in HPI, PMH and Problem List.  SH:  Social History   Social History  . Marital Status: Single    Spouse Name: N/A  . Number of Children: N/A  . Years of Education: N/A   Occupational History  . Not on file.   Social History Main Topics  . Smoking status: Former Smoker    Quit date: 10/19/2015  . Smokeless tobacco: Never Used  . Alcohol Use: 0.0 oz/week    0 Standard drinks or equivalent per week     Comment: occasionally  . Drug Use: Yes    Special: Marijuana     Comment: only socially  . Sexual Activity: Not on file   Other  Topics Concern  . Not on file   Social History Narrative    FH:  Family History  Problem Relation Age of Onset  . Heart attack Mother   . Hyperlipidemia Father   . Diabetes Brother     Past Medical History  Diagnosis Date  . Hearing loss     bilateral  . Asthma   . Chronic systolic CHF (congestive heart failure) (HCC)     a. ECHO 01/15/2016 EF 10-15%. 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  Marland Kitchen Morbid obesity (HCC)   . Snoring   . Mitral regurgitation     a. severe, felt to be functional 2/2 LV dilation     Current Outpatient Prescriptions  Medication Sig Dispense Refill  . albuterol (PROAIR HFA) 108 (90 BASE) MCG/ACT inhaler Inhale 2 puffs into the lungs every 6 (six) hours as needed for wheezing or shortness of breath.    . carvedilol (COREG) 6.25 MG tablet Take 1 tablet (6.25 mg total) by mouth 2 (two) times daily with a meal. 60 tablet 6  . digoxin (LANOXIN) 0.125 MG tablet Take 1 tablet (0.125 mg total) by mouth daily. 30 tablet 1  . furosemide (LASIX) 40 MG tablet Take 80 mg by mouth daily.    Marland Kitchen losartan (COZAAR) 25 MG tablet Take 1 tablet (25 mg total) by mouth daily. 30 tablet  6   No current facility-administered medications for this encounter.    Filed Vitals:   02/20/16 1216  Pulse: 97  Weight: 323 lb (146.512 kg)  SpO2: 98%   MAP 102   PHYSICAL EXAM: General:  Well appearing. No resp difficulty. Partner present  HEENT: normal Neck: supple. JVP 9-10. Difficulty to assess due to body habitus.  Carotids 2+ bilaterally; no bruits. No lymphadenopathy or thryomegaly appreciated. Cor: PMI normal. Regular rate & rhythm. No rubs, or murmurs. + S3  Lungs: clear Abdomen: Obese, soft, nontender, nondistended. No hepatosplenomegaly. No bruits or masses. Good bowel sounds. Extremities: no cyanosis, clubbing, rash, edema Neuro: alert & orientedx3, cranial nerves grossly intact. Moves all 4 extremities w/o difficulty. Affect pleasant.  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 LHC ok. Cor ok. NICM ECHO 01/15/2016 EF 10-15%. Severe MR. Plan to repeat ECHO after HF meds optimized for 3 months. Early August.  NYHA IIIB .Volume status stable. Restarte lasix 40 mg twice a day.  Continue carvedilol 6.25 mg twice a day. Continue digoxin 0.125 mg daily.  Intolerant lisinopril due to cough/dizziness.  Intolerant entresto due to hypotension.  Continue losartan 25 mg daily.   Add 12.5 mg spiro daily. BMET next week.  Plan to start cardiac rehab at [facility].   She understands she needs to prevent pregnancy with HF meds. She is not currently sexually active. Discussed low salt food choices and limiting fluid intake to < 2 liters per day.  2. Snoring- Will need outpatient sleep study. Set up pulmonary referral next visit. She would like to hold off.  3. Morbid Obesity- Needs to lose considerable amount of weight. Dietitian meeting with her now.  4. 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 Central MR No stenosis.  Thought to be from severe LV dysfunction.  5. Tobacco Abuse- Counseled to stop smoking.   Follow up next week with pharmacy then 4 weeks for regular visit. Check BMET next week.  Maguire Killmer NP-C  12:21 PM

## 2016-02-20 NOTE — Progress Notes (Signed)
Advanced Heart Failure Medication Review by a Pharmacist  Does the patient  feel that his/her medications are working for him/her?  yes  Has the patient been experiencing any side effects to the medications prescribed?  yes  Does the patient measure his/her own blood pressure or blood glucose at home?  yes   Does the patient have any problems obtaining medications due to transportation or finances?   no  Understanding of regimen: good Understanding of indications: good Potential of compliance: good Patient understands to avoid NSAIDs. Patient understands to avoid decongestants.  Issues to address at subsequent visits: None   Pharmacist comments:  Kirsten Wheeler is a pleasant 35 yo F presenting with her daughter and partner but without a medication list. She reports good compliance with her regimen but does state that she has noticed spots in her vision along with SOB when she goes up her steps too quickly. She states that her BP has been stable and wnl at home and that the [facility] nurse is monitoring it regularly. I have suggested that she make sure to get up slowly from a sitting/lying position and to take it slowly when going up the stairs. She also states that she feels that she has been more depressed lately and I will make sure that [name] addresses this with her. She did not have any other medication-related questions or concerns for me at this time.   [name], PharmD, BCPS, CPP Clinical Pharmacist Pager: [phone] Phone: [phone] 02/20/2016 12:01 PM      Time with patient: 10 minutes Preparation and documentation time: 2 minutes Total time: 12 minutes

## 2016-02-22 ENCOUNTER — Encounter (HOSPITAL_COMMUNITY): Payer: Self-pay

## 2016-02-22 NOTE — Patient Instructions (Signed)
[place] [facility] faxed medical record request on date 02/07/16 on behalf of patient for Social Security disability claim. All records from CHF clinic and associated providers faxed to provided # [phone] Copy of request scanned into patient's electronic medical records. Case # [id number]  [name]

## 2016-03-04 ENCOUNTER — Ambulatory Visit (HOSPITAL_COMMUNITY): Payer: Medicaid Other

## 2016-03-11 ENCOUNTER — Ambulatory Visit (HOSPITAL_COMMUNITY): Payer: Medicaid Other

## 2016-03-14 ENCOUNTER — Telehealth (HOSPITAL_COMMUNITY): Payer: Self-pay | Admitting: *Deleted

## 2016-03-14 NOTE — Telephone Encounter (Signed)
Jessica with [facility] called and stated pts weight was up, shes very sob, heart rate over 100, and wheezing w/ productive cough.  Spoke with Tonye Becket and she advised pt to be seen in clinic this AM. Shanda Bumps notified and pt agreed to be here before 12pm to be seen by [name] and Dr.Bensimhon.

## 2016-03-20 ENCOUNTER — Telehealth (HOSPITAL_COMMUNITY): Payer: Self-pay | Admitting: Vascular Surgery

## 2016-03-20 ENCOUNTER — Encounter (HOSPITAL_COMMUNITY): Payer: Medicaid Other

## 2016-03-21 ENCOUNTER — Telehealth (HOSPITAL_COMMUNITY): Payer: Self-pay | Admitting: Vascular Surgery

## 2016-03-21 NOTE — Telephone Encounter (Signed)
Returned pt call, missed appt 03/20/16

## 2016-03-22 ENCOUNTER — Other Ambulatory Visit (HOSPITAL_COMMUNITY): Payer: Self-pay | Admitting: Cardiology

## 2016-03-22 DIAGNOSIS — I509 Heart failure, unspecified: Secondary | ICD-10-CM

## 2016-03-22 MED ORDER — DIGOXIN 125 MCG PO TABS: 0.1250 mg | ORAL_TABLET | Freq: Every day | ORAL | Status: DC

## 2016-03-22 MED ORDER — CARVEDILOL 6.25 MG PO TABS: 6.2500 mg | ORAL_TABLET | Freq: Two times a day (BID) | ORAL | Status: DC

## 2016-03-22 MED ORDER — FUROSEMIDE 40 MG PO TABS: 80.0000 mg | ORAL_TABLET | Freq: Every day | ORAL | Status: DC

## 2016-03-22 MED ORDER — SPIRONOLACTONE 25 MG PO TABS: 12.5000 mg | ORAL_TABLET | Freq: Every day | ORAL | Status: DC

## 2016-03-22 MED ORDER — LOSARTAN POTASSIUM 25 MG PO TABS: 25.0000 mg | ORAL_TABLET | Freq: Every day | ORAL | Status: DC

## 2016-03-22 NOTE — Telephone Encounter (Signed)
Encounter open error 

## 2016-03-22 NOTE — Telephone Encounter (Signed)
Patients HH nurse called to request refills Also requested to note in patients chart that patient is truly giving 100% in compliance- patient was unable to keep appointment on 7/6 due to lack of transportation  Refills returned to pharmacy as requested  Verbal order to extend services with HH x 60 days

## 2016-03-24 DIAGNOSIS — O903 Peripartum cardiomyopathy: Secondary | ICD-10-CM | POA: Diagnosis not present

## 2016-03-24 DIAGNOSIS — I340 Nonrheumatic mitral (valve) insufficiency: Secondary | ICD-10-CM | POA: Diagnosis not present

## 2016-03-24 DIAGNOSIS — I5021 Acute systolic (congestive) heart failure: Secondary | ICD-10-CM | POA: Diagnosis not present

## 2016-04-10 ENCOUNTER — Ambulatory Visit (HOSPITAL_COMMUNITY)
Admission: RE | Admit: 2016-04-10 | Discharge: 2016-04-10 | Disposition: A | Discharge status: Home / Self Care | Payer: Medicaid Other | Source: Ambulatory Visit | Attending: Cardiology | Admitting: Cardiology

## 2016-04-10 ENCOUNTER — Encounter (HOSPITAL_COMMUNITY): Payer: Self-pay

## 2016-04-10 VITALS — BP 116/1 | HR 94 | Wt 313.6 lb

## 2016-04-10 DIAGNOSIS — R05 Cough: Secondary | ICD-10-CM | POA: Diagnosis not present

## 2016-04-10 DIAGNOSIS — R0683 Snoring: Secondary | ICD-10-CM | POA: Insufficient documentation

## 2016-04-10 DIAGNOSIS — I340 Nonrheumatic mitral (valve) insufficiency: Secondary | ICD-10-CM

## 2016-04-10 DIAGNOSIS — I428 Other cardiomyopathies: Secondary | ICD-10-CM | POA: Insufficient documentation

## 2016-04-10 DIAGNOSIS — Z9189 Other specified personal risk factors, not elsewhere classified: Secondary | ICD-10-CM

## 2016-04-10 DIAGNOSIS — I429 Cardiomyopathy, unspecified: Secondary | ICD-10-CM | POA: Diagnosis not present

## 2016-04-10 DIAGNOSIS — I509 Heart failure, unspecified: Secondary | ICD-10-CM

## 2016-04-10 DIAGNOSIS — Z79899 Other long term (current) drug therapy: Secondary | ICD-10-CM | POA: Insufficient documentation

## 2016-04-10 DIAGNOSIS — I5022 Chronic systolic (congestive) heart failure: Secondary | ICD-10-CM | POA: Diagnosis not present

## 2016-04-10 DIAGNOSIS — J45909 Unspecified asthma, uncomplicated: Secondary | ICD-10-CM | POA: Insufficient documentation

## 2016-04-10 DIAGNOSIS — Z87891 Personal history of nicotine dependence: Secondary | ICD-10-CM | POA: Diagnosis not present

## 2016-04-10 DIAGNOSIS — Z6841 Body Mass Index (BMI) 40.0 and over, adult: Secondary | ICD-10-CM | POA: Diagnosis not present

## 2016-04-10 DIAGNOSIS — Z720 Tobacco use: Secondary | ICD-10-CM

## 2016-04-10 DIAGNOSIS — R059 Cough, unspecified: Secondary | ICD-10-CM

## 2016-04-10 DIAGNOSIS — Z8249 Family history of ischemic heart disease and other diseases of the circulatory system: Secondary | ICD-10-CM | POA: Insufficient documentation

## 2016-04-10 DIAGNOSIS — Z833 Family history of diabetes mellitus: Secondary | ICD-10-CM | POA: Diagnosis not present

## 2016-04-10 DIAGNOSIS — O903 Peripartum cardiomyopathy: Secondary | ICD-10-CM

## 2016-04-10 LAB — CBC
HEMATOCRIT: 44.9 % (ref 36.0–46.0)
HEMOGLOBIN: 15.5 g/dL — AB (ref 12.0–15.0)
MCH: 26.5 pg (ref 26.0–34.0)
MCHC: 34.5 g/dL (ref 30.0–36.0)
MCV: 76.6 fL — ABNORMAL LOW (ref 78.0–100.0)
Platelets: 270 10*3/uL (ref 150–400)
RBC: 5.86 MIL/uL — ABNORMAL HIGH (ref 3.87–5.11)
RDW: 16.5 % — ABNORMAL HIGH (ref 11.5–15.5)
WBC: 9 10*3/uL (ref 4.0–10.5)

## 2016-04-10 LAB — BASIC METABOLIC PANEL
Anion gap: 8 (ref 5–15)
BUN: 11 mg/dL (ref 6–20)
CHLORIDE: 103 mmol/L (ref 101–111)
CO2: 25 mmol/L (ref 22–32)
CREATININE: 0.81 mg/dL (ref 0.44–1.00)
Calcium: 9.2 mg/dL (ref 8.9–10.3)
GFR calc non Af Amer: >60 mL/min (ref >60)
GLUCOSE: 86 mg/dL (ref 65–99)
Potassium: 3.9 mmol/L (ref 3.5–5.1)
Sodium: 136 mmol/L (ref 135–145)

## 2016-04-10 MED ORDER — CARVEDILOL 6.25 MG PO TABS: 9.3750 mg | ORAL_TABLET | Freq: Two times a day (BID) | ORAL | 3 refills | Status: DC

## 2016-04-10 NOTE — Progress Notes (Signed)
Advanced Heart Failure Medication Review by a Pharmacist  Does the patient  feel that his/her medications are working for him/her?  yes  Has the patient been experiencing any side effects to the medications prescribed?  no  Does the patient measure his/her own blood pressure or blood glucose at home?  no   Does the patient have any problems obtaining medications due to transportation or finances?   no  Understanding of regimen: good Understanding of indications: good Potential of compliance: good Patient understands to avoid NSAIDs. Patient understands to avoid decongestants.  Issues to address at subsequent visits: None   Pharmacist comments:  Ms. Stoudmire is a pleasant 35 yo F presenting without a medication list but with good recall of her regimen. She reports good compliance with her regimen and did not have any specific medication-related questions or concerns for me at this time.   [name], PharmD, BCPS, CPP Clinical Pharmacist Pager: [phone] Phone: [phone] 04/10/2016 9:51 AM      Time with patient: 10 minutes Preparation and documentation time: 2 minutes Total time: 12 minutes

## 2016-04-10 NOTE — Progress Notes (Signed)
[name], female   DOB: 12/16/1980, 35 y.o.   MRN: [id number]    Advanced Heart Failure Clinic Note   PCP: None  Primary HF Cardiologist: Dr Shirlee Latch   HPI: Kirsten Wheeler is a 35 y.o. 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 Diuresed with IV lasix and transitioned to lasix 40 mg twice a day. Had LHC with normal cors. Intolerant entresto due to hypotension. Discharge weight 320 pounds.   She presents today for regular follow up. At last visit had to restart lasix as she was out for several days due to cost. Also added spiro.  She did not come for repeat lab work. Down 10 lbs from last visit.  Has not been able to do cardiac rehab as it isn't completely covered by medicaid.  Breathing has been good, has good and bad days. Got SOB walking to clinic, but parked on [address].  Denies SOB with ADLs such as bathing and changing clothes.  Weight at home down to 311. Still has [facility] at home for medication assistant. Has difficulty with transportation. Can use [facility] to get to appointments.  Denies lightheadedness or dizziness. Gets mild fatigue after first taking medicine. Has had cough for about a month, no fever. Occasional production clear to yellow. Does not smoke cigarettes, only weed. No ETOH.   LHC/RHC 01/18/2016  Coronaries: No significant coronary disease. Right Dominance RHC Procedural Findings: Hemodynamics (mmHg) RA mean 15 RV 54/18 PA 61/25, mean 41 PCWP mean 18 LV 101/25 AO 104/84 Oxygen saturations: PA 90% AO 45% Cardiac Output (Fick) 4.11  Cardiac Index (Fick) 1.7  PVR 5.4 WU  Labs 01/20/2016: K 3.7 creatinine 0.87    ROS: All systems negative except as listed in HPI, PMH and Problem List.  SH:  Social History   Social History  . Marital status: Single    Spouse name: N/A  . Number of children:  N/A  . Years of education: N/A   Occupational History  . Not on file.   Social History Main Topics  . Smoking status: Former Smoker    Quit date: 10/19/2015  . Smokeless tobacco: Never Used  . Alcohol use 0.0 oz/week     Comment: occasionally  . Drug use:     Types: Marijuana     Comment: only socially  . Sexual activity: Not on file   Other Topics Concern  . Not on file   Social History Narrative  . No narrative on file    FH:  Family History  Problem Relation Age of Onset  . Heart attack Mother   . Hyperlipidemia Father   . Diabetes Brother     Past Medical History:  Diagnosis Date  . Asthma   . Chronic systolic CHF (congestive heart failure) (HCC)    a. ECHO 01/15/2016 EF 10-15%. 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  Marland Kitchen Hearing loss    bilateral  . Mitral regurgitation    a. severe, felt to be functional 2/2 LV dilation   . Morbid obesity (HCC)   . Snoring     Current Outpatient Prescriptions  Medication Sig Dispense Refill  . albuterol (PROAIR HFA) 108 (90 BASE) MCG/ACT inhaler Inhale 2 puffs into the lungs every 6 (six) hours as needed for wheezing or shortness of breath.    Marland Kitchen  carvedilol (COREG) 6.25 MG tablet Take 1 tablet (6.25 mg total) by mouth 2 (two) times daily with a meal. 60 tablet 3  . digoxin (LANOXIN) 0.125 MG tablet Take 1 tablet (0.125 mg total) by mouth daily. 30 tablet 3  . furosemide (LASIX) 40 MG tablet Take 2 tablets (80 mg total) by mouth daily. 60 tablet 3  . losartan (COZAAR) 25 MG tablet Take 1 tablet (25 mg total) by mouth daily. 30 tablet 3  . spironolactone (ALDACTONE) 25 MG tablet Take 0.5 tablets (12.5 mg total) by mouth daily. 15 tablet 3   No current facility-administered medications for this encounter.     Vitals:   04/10/16 0948  BP: (!) 116/1  Pulse: 94  SpO2: 96%  Weight: (!) 313 lb 9.6 oz (142.2 kg)   Wt Readings from Last 3 Encounters:  04/10/16 (!) 313 lb 9.6 oz (142.2 kg)  02/20/16 (!) 323 lb (146.5 kg)    01/25/16 (!) 320 lb 6.4 oz (145.3 kg)     PHYSICAL EXAM: General:  Well appearing. No resp difficulty. Partner present  HEENT: normal Neck: supple. JVP 7-8, though difficult to assess due to body habitus.  Carotids 2+ bilaterally; no bruits. No thyromegaly or nodule noted.  Cor: PMI normal. RRR. No rubs, or murmurs. + S3  Lungs: CTAB, normal effort Abdomen: Obese, soft, NT, ND, no HSM. No bruits or masses. +BS  Extremities: no cyanosis, clubbing, rash. Trace ankle edema.  Neuro: A&O x3, cranial nerves grossly intact. Moves all 4 extremities w/o difficulty. Affect pleasant.  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 LHC ok. Cor ok. NICM ECHO 01/15/2016 EF 10-15%. Severe MR. NYHA IIIB .Volume status stable on exam - Will plan for repeat Echo in 4-6 weeks.  - Continue lasix 40 mg BID.   - Increase carvedilol 9.375 mg BID - Continue digoxin 0.125 mg daily.  - Intolerant lisinopril due to cough/dizziness.  Intolerant entresto due to hypotension.  - Continue losartan 25 mg daily.   - Continue 12.5 mg spiro daily. BMET next week.  - Can't afford cardiac rehab with incomplete coverage by Medicaid. Recommended she look for free walking programs near her home.   - Have previously discussed the need to prevent pregnancy with HF meds. She is not currently sexually active. Discussed low salt food choices and limiting fluid intake to < 2 liters per day.  2. Snoring - Still wants to hold off on outpatient sleep study.  - Will work on pulmonary referral after repeat echo as above.  3. Morbid Obesity - Weight down 10 lbs from last visit.  Congratulated on her progress and encouraged to continue.  4. Severe MR - Malcoaptation of the valve leaflets. Central MR No stenosis.  Thought to be from severe LV dysfunction.  - Will re-evaluate on repeat echo as above.  5. Tobacco Abuse - No longer using cigarettes. Only marijuana.   6. Cough -  Recommended she try OTC Zyrtec. If no relief should see PCP.   BMET and CBC today.  Follow up 4/6 weeks with Dr. Shirlee Latch for Echo.   Mariam Dollar Tillery PA-C 9:56 AM

## 2016-04-10 NOTE — Patient Instructions (Signed)
INCREASE Carvedilol (Coreg) to 9.375 mg (1.5 tabs) twice daily.  Routine lab work today. Will notify you of abnormal results, otherwise no news is good news!  Try zyrtec for coughing. Avoid Sudafed.  Follow up 4-6 weeks with echocardiogram and appointment with Dr. Shirlee Latch.  Do the following things EVERYDAY: 1) Weigh yourself in the morning before breakfast. Write it down and keep it in a log. 2) Take your medicines as prescribed 3) Eat low salt foods-Limit salt (sodium) to 2000 mg per day.  4) Stay as active as you can everyday 5) Limit all fluids for the day to less than 2 liters

## 2016-04-17 DIAGNOSIS — Z736 Limitation of activities due to disability: Secondary | ICD-10-CM

## 2016-04-30 ENCOUNTER — Telehealth: Payer: Self-pay | Admitting: Licensed Clinical Social Worker

## 2016-04-30 ENCOUNTER — Telehealth (HOSPITAL_COMMUNITY): Payer: Self-pay

## 2016-04-30 NOTE — Telephone Encounter (Signed)
Demetra Shiner with [employer] called to report patient had telemonitoring services but lost her power and due to financial strains was forced to move in with a friend that has poor reception to continue telemonitoring services. Will forward this to CHF SW Annice Pih to check in with patient to see if there are any community resources patient may benefit from and if assistance may be needed for medications.  [name], RN

## 2016-04-30 NOTE — Telephone Encounter (Signed)
CSW received referral to assist patient with financial resources. CSW attempted to reach patient via phone with no success. Message left for return call. Lasandra Beech, LCSW [phone]

## 2016-05-21 ENCOUNTER — Telehealth (HOSPITAL_COMMUNITY): Payer: Self-pay | Admitting: Vascular Surgery

## 2016-05-21 NOTE — Telephone Encounter (Signed)
Returned pt call to resch appt  ?

## 2016-05-22 ENCOUNTER — Ambulatory Visit (HOSPITAL_COMMUNITY): Payer: Medicaid Other

## 2016-05-22 ENCOUNTER — Encounter (HOSPITAL_COMMUNITY): Payer: Medicaid Other | Admitting: Internal Medicine

## 2016-05-27 ENCOUNTER — Telehealth (HOSPITAL_COMMUNITY): Payer: Self-pay

## 2016-05-27 NOTE — Telephone Encounter (Signed)
Kirsten Wheeler with [employer] called to report per patient's telemonitoring service, patient had 7 lb weight gain over the weekend. However, patient states she forgot her diuretics over the weekend and took it this morning. Patient to follow up in the morning and will call CHF clinic if she becomes symptomatic (only reports mild BLEE at this time). Kirsten Wheeler to fax reports to our office for review of weight and vitals.  [name], RN

## 2016-06-18 ENCOUNTER — Telehealth (HOSPITAL_COMMUNITY): Payer: Self-pay

## 2016-06-18 ENCOUNTER — Ambulatory Visit (HOSPITAL_BASED_OUTPATIENT_CLINIC_OR_DEPARTMENT_OTHER)
Admission: RE | Admit: 2016-06-18 | Discharge: 2016-06-18 | Disposition: A | Discharge status: Home / Self Care | Payer: Medicaid Other | Source: Ambulatory Visit | Attending: Internal Medicine | Admitting: Internal Medicine

## 2016-06-18 ENCOUNTER — Ambulatory Visit (HOSPITAL_COMMUNITY)
Admission: RE | Admit: 2016-06-18 | Discharge: 2016-06-18 | Disposition: A | Discharge status: Home / Self Care | Payer: Medicaid Other | Source: Ambulatory Visit | Attending: Cardiology | Admitting: Cardiology

## 2016-06-18 VITALS — BP 106/66 | HR 95 | Wt 332.0 lb

## 2016-06-18 DIAGNOSIS — I428 Other cardiomyopathies: Secondary | ICD-10-CM | POA: Insufficient documentation

## 2016-06-18 DIAGNOSIS — Z833 Family history of diabetes mellitus: Secondary | ICD-10-CM | POA: Insufficient documentation

## 2016-06-18 DIAGNOSIS — R0683 Snoring: Secondary | ICD-10-CM | POA: Diagnosis not present

## 2016-06-18 DIAGNOSIS — I340 Nonrheumatic mitral (valve) insufficiency: Secondary | ICD-10-CM | POA: Diagnosis not present

## 2016-06-18 DIAGNOSIS — I5022 Chronic systolic (congestive) heart failure: Secondary | ICD-10-CM

## 2016-06-18 DIAGNOSIS — Z87891 Personal history of nicotine dependence: Secondary | ICD-10-CM | POA: Diagnosis not present

## 2016-06-18 DIAGNOSIS — Z8249 Family history of ischemic heart disease and other diseases of the circulatory system: Secondary | ICD-10-CM | POA: Insufficient documentation

## 2016-06-18 DIAGNOSIS — Z79899 Other long term (current) drug therapy: Secondary | ICD-10-CM | POA: Diagnosis not present

## 2016-06-18 DIAGNOSIS — Z6841 Body Mass Index (BMI) 40.0 and over, adult: Secondary | ICD-10-CM | POA: Diagnosis not present

## 2016-06-18 LAB — BASIC METABOLIC PANEL
Anion gap: 6 (ref 5–15)
BUN: 9 mg/dL (ref 6–20)
CALCIUM: 9.2 mg/dL (ref 8.9–10.3)
CHLORIDE: 108 mmol/L (ref 101–111)
CO2: 27 mmol/L (ref 22–32)
CREATININE: 0.98 mg/dL (ref 0.44–1.00)
GFR calc non Af Amer: >60 mL/min (ref >60)
GLUCOSE: 93 mg/dL (ref 65–99)
Potassium: 4.3 mmol/L (ref 3.5–5.1)
Sodium: 141 mmol/L (ref 135–145)

## 2016-06-18 MED ORDER — POTASSIUM CHLORIDE CRYS ER 20 MEQ PO TBCR: 40.0000 meq | EXTENDED_RELEASE_TABLET | Freq: Every day | ORAL | 3 refills | Status: DC

## 2016-06-18 MED ORDER — LOSARTAN POTASSIUM 25 MG PO TABS: 25.0000 mg | ORAL_TABLET | Freq: Two times a day (BID) | ORAL | 3 refills | Status: DC

## 2016-06-18 MED ORDER — FUROSEMIDE 40 MG PO TABS: ORAL_TABLET | ORAL | 3 refills | Status: DC

## 2016-06-18 NOTE — Progress Notes (Signed)
  Echocardiogram 2D Echocardiogram has been performed.  Kirsten Wheeler 06/18/2016, 2:25 PM

## 2016-06-18 NOTE — Patient Instructions (Signed)
Increase Losartan to 25 mg Twice daily   Increase Furosemide (Lasix) to 80 mg (2 tabs) every AM and 40 mg (1tab) in PM FOR 4 DAYS, then change to 80 mg every AM and 40 mg in PM every other day   Start Potassium 40 meq (2 tabs) daily  Labs today  Labs in 10 days  Your physician recommends that you schedule a follow-up appointment in: 3 weeks

## 2016-06-18 NOTE — Telephone Encounter (Signed)
Morene Crocker with [facility] called to report that telemonitoring services will be discontinued due to poor signal per patient request.  [name], RN

## 2016-06-18 NOTE — Progress Notes (Signed)
Advanced Heart Failure Medication Review by a Pharmacist  Does the patient  feel that his/her medications are working for him/her?  yes  Has the patient been experiencing any side effects to the medications prescribed?  no   Does the patient measure his/her own blood pressure or blood glucose at home?  no   Does the patient have any problems obtaining medications due to transportation or finances?   no  Understanding of regimen: fair Understanding of indications: fair Potential of compliance: poor Patient understands to avoid NSAIDs. Patient understands to avoid decongestants.  Issues to address at subsequent visits: Compliance   Pharmacist comments:  Ms. Donate is a pleasant 35 yo F presenting with her partner and without a medication list. She admits to only taking losartan ~3 days per week 2/2 forgetting to take it in the evening. We discussed the importance of compliance and I recommended that she try to take it in the morning if she is better able to remember it at this time.   [name], PharmD, BCPS, CPP Clinical Pharmacist Pager: [phone] Phone: [phone] 06/18/2016 3:01 PM      Time with patient: 10 minutes Preparation and documentation time: 2 minutes Total time: 12 minutes

## 2016-06-18 NOTE — Progress Notes (Signed)
Pt request to have labs done at [facility] in [place] in 10 days, order faxed to them at [phone]

## 2016-06-19 NOTE — Progress Notes (Signed)
[name], female   DOB: 05/29/1981, 35 y.o.   MRN: [id number]    Advanced Heart Failure Clinic Note   PCP: None  Primary HF Cardiologist: Dr Shirlee LatchMcLean   HPI: Kirsten Wheeler is a 35 y.o. 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 Diuresed with IV lasix and transitioned to lasix 40 mg twice a day. Had LHC with normal coronaries. Intolerant of Entresto due to hypotension. Discharge weight 320 pounds.   She returns today for followup. Weight is up 19 lbs.  She has been taking losartan only three times a week.  She asks about stopping spironolactone.  Echo done today shows that EF remains 20% with severe LV dilation and moderate-severe central mitral regurgitation.  She is short of breath carrying a load or walking a long distance.  No orthopnea/PND. No chest pain.  No lightheadedness.  She is a single mother with limited social support.   Labs (7/17): K 3.9, creatinine 0.81, hgb 15.5  PMH:  1. Chronic systolic CHF: Nonischemic cardiomyopathy.  No family history of cardiomyopathy, no heavy ETOH, cocaine, or amphetamines.  - Echo (12/16) with EF 20-25%, moderate MR.  - Echo (5/17) with severely dilated LV, EF 10-15%, severe functional MR, mildly decreased RV systolic function. - LHC/RHC (8/655/17): No significant coronary disease; mean RA 15, PA 61/25 mean 41, mean PCWP 18, CI 1.7 (Fick), PVR 5.4 WU.  - Echo (10/17): EF 20%, severely dilated LV, normal RV size with mildly decreased systolic function, moderate-severe central MR.  2. Morbid obesity.  3. 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  Echo (10/17) with moderate-severe MR.   ROS: All systems negative except as listed in HPI, PMH and Problem List.  SH:  Social History   Social History  . Marital status: Single    Spouse name: N/A  . Number of children:  N/A  . Years of education: N/A   Occupational History  . Not on file.   Social History Main Topics  . Smoking status: Former Smoker    Quit date: 10/19/2015  . Smokeless tobacco: Never Used  . Alcohol use 0.0 oz/week     Comment: occasionally  . Drug use:     Types: Marijuana     Comment: only socially  . Sexual activity: Not on file   Other Topics Concern  . Not on file   Social History Narrative  . No narrative on file    FH:  Family History  Problem Relation Age of Onset  . Heart attack Mother   . Hyperlipidemia Father   . Diabetes Brother     Current Outpatient Prescriptions  Medication Sig Dispense Refill  . albuterol (PROAIR HFA) 108 (90 BASE) MCG/ACT inhaler Inhale 2 puffs into the lungs every 6 (six) hours as needed for wheezing or shortness of breath.    . carvedilol (COREG) 6.25 MG tablet Take 1.5 tablets (9.375 mg total) by mouth 2 (two) times daily with a meal. 90 tablet 3  . digoxin (LANOXIN) 0.125 MG tablet Take 1 tablet (0.125 mg total) by mouth daily. 30 tablet 3  . furosemide (LASIX) 40 MG tablet Take 2 tabs every AM and 1 tab in PM every other day 75 tablet 3  . losartan (COZAAR) 25 MG tablet Take 1 tablet (25 mg total) by mouth 2 (two) times daily. 60  tablet 3  . spironolactone (ALDACTONE) 25 MG tablet Take 0.5 tablets (12.5 mg total) by mouth daily. 15 tablet 3  . potassium chloride SA (K-DUR,KLOR-CON) 20 MEQ tablet Take 2 tablets (40 mEq total) by mouth daily. 60 tablet 3   No current facility-administered medications for this encounter.     Vitals:   06/18/16 1421  BP: 106/66  Pulse: 95  SpO2: 97%  Weight: (!) 332 lb (150.6 kg)   Wt Readings from Last 3 Encounters:  06/18/16 (!) 332 lb (150.6 kg)  04/10/16 (!) 313 lb 9.6 oz (142.2 kg)  02/20/16 (!) 323 lb (146.5 kg)     PHYSICAL EXAM: General:  Well appearing. No resp difficulty. Partner present  HEENT: normal Neck: Thick. JVP 8-9 cm.  Carotids 2+ bilaterally; no bruits. No thyromegaly  or nodule noted.  Cor: PMI normal. RRR. No rubs, or murmurs. No S3/S4.  Lungs: CTAB, normal effort Abdomen: Obese, soft, NT, ND, no HSM. No bruits or masses. +BS  Extremities: no cyanosis, clubbing, rash. Trace ankle edema.  Neuro: A&O x3, cranial nerves grossly intact. Moves all 4 extremities w/o difficulty. Affect pleasant.  ASSESSMENT & PLAN: 1.Chronic Systolic HF:  Nonischemic cardiomyopathy.  Echo 10/17 with severely dilated LV, EF 20%, moderate-severe central MR.  No history of heavy ETOH or cocaine.  No family history of cardiomyopathy.  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   NYHA class II-III.  She is mildly volume overloaded on exam and weight is up.  It is very concerning that she had low output on RHC in 5/17.  - Repeat echo after full medication titration (3-4 more months).  If EF remains low, would consider ICD given her age.  Not CRT candidate with narrow QRS.  I do not think that her body habitus would allow a cardiac MRI.  - Increase Lasix to 80 qam/40 qpm x 4 days then 80 daily alternating with 80 qam/40 qpm.  Add KCl 20 daily.   BMET today and in 10 days.  - Continue Coreg 9.375 mg bid and spironolactone 12.5 daily.  - Continue digoxin 0.125 mg daily, check level today.  - Intolerant lisinopril due to cough/dizziness.  Intolerant entresto due to hypotension. She needs to take losartan daily rather than 3 times a week.  We discussed this and she will take it daily.    - Have previously discussed the need to prevent pregnancy with HF meds.  - I am concerned by the low cardiac output that she had on prior RHC in 5/17.  She is too large for transplant and probably does not have good social support for LVAD.  Will need to be as aggressive as possible with medical management. CPX when meds are optimized.  2. Snoring: Still wants to hold off on outpatient sleep study.  3.Mitral regurgitation: Moderate to severe central MR, likely functional.   Marca Ancona 06/19/2016

## 2016-06-28 ENCOUNTER — Other Ambulatory Visit: Payer: Self-pay | Admitting: Cardiology

## 2016-06-29 LAB — BASIC METABOLIC PANEL
BUN/Creatinine Ratio: 15 (ref 9–23)
BUN: 13 mg/dL (ref 6–20)
CALCIUM: 9.6 mg/dL (ref 8.7–10.2)
CO2: 29 mmol/L (ref 18–29)
CREATININE: 0.89 mg/dL (ref 0.57–1.00)
Chloride: 100 mmol/L (ref 96–106)
GFR calc Af Amer: 98 mL/min/{1.73_m2} (ref >59)
GFR, EST NON AFRICAN AMERICAN: 85 mL/min/{1.73_m2} (ref >59)
Glucose: 103 mg/dL — ABNORMAL HIGH (ref 65–99)
Potassium: 4 mmol/L (ref 3.5–5.2)
Sodium: 138 mmol/L (ref 134–144)

## 2016-07-09 ENCOUNTER — Encounter (HOSPITAL_COMMUNITY): Payer: Medicaid Other

## 2016-07-12 ENCOUNTER — Other Ambulatory Visit (HOSPITAL_COMMUNITY): Payer: Self-pay | Admitting: Cardiology

## 2016-08-05 ENCOUNTER — Telehealth (HOSPITAL_COMMUNITY): Payer: Self-pay | Admitting: Vascular Surgery

## 2016-08-05 ENCOUNTER — Other Ambulatory Visit (HOSPITAL_COMMUNITY): Payer: Self-pay | Admitting: *Deleted

## 2016-08-05 DIAGNOSIS — I509 Heart failure, unspecified: Secondary | ICD-10-CM

## 2016-08-05 MED ORDER — DIGOXIN 125 MCG PO TABS: 0.1250 mg | ORAL_TABLET | Freq: Every day | ORAL | 3 refills | Status: DC

## 2016-08-05 NOTE — Telephone Encounter (Signed)
Pt needs refill on digoxin, she contacted her pharmacy they told her she needed auth from doctor

## 2016-08-14 ENCOUNTER — Encounter (HOSPITAL_COMMUNITY): Payer: Medicaid Other

## 2016-08-21 ENCOUNTER — Inpatient Hospital Stay (HOSPITAL_COMMUNITY): Admission: RE | Admit: 2016-08-21 | Payer: Medicaid Other | Source: Ambulatory Visit

## 2016-08-29 ENCOUNTER — Encounter (HOSPITAL_COMMUNITY): Payer: Medicaid Other

## 2016-09-23 ENCOUNTER — Ambulatory Visit (HOSPITAL_COMMUNITY)
Admission: RE | Admit: 2016-09-23 | Discharge: 2016-09-23 | Disposition: A | Discharge status: Home / Self Care | Payer: Medicaid Other | Source: Ambulatory Visit | Attending: Internal Medicine | Admitting: Internal Medicine

## 2016-09-23 ENCOUNTER — Encounter (HOSPITAL_COMMUNITY): Payer: Self-pay

## 2016-09-23 VITALS — BP 144/118 | HR 109 | Wt 344.8 lb

## 2016-09-23 DIAGNOSIS — I428 Other cardiomyopathies: Secondary | ICD-10-CM | POA: Insufficient documentation

## 2016-09-23 DIAGNOSIS — Z6841 Body Mass Index (BMI) 40.0 and over, adult: Secondary | ICD-10-CM | POA: Insufficient documentation

## 2016-09-23 DIAGNOSIS — I5022 Chronic systolic (congestive) heart failure: Secondary | ICD-10-CM

## 2016-09-23 DIAGNOSIS — Z87891 Personal history of nicotine dependence: Secondary | ICD-10-CM | POA: Diagnosis not present

## 2016-09-23 DIAGNOSIS — I959 Hypotension, unspecified: Secondary | ICD-10-CM | POA: Diagnosis not present

## 2016-09-23 DIAGNOSIS — Z79899 Other long term (current) drug therapy: Secondary | ICD-10-CM | POA: Diagnosis not present

## 2016-09-23 DIAGNOSIS — R0683 Snoring: Secondary | ICD-10-CM

## 2016-09-23 DIAGNOSIS — I340 Nonrheumatic mitral (valve) insufficiency: Secondary | ICD-10-CM | POA: Diagnosis not present

## 2016-09-23 LAB — BASIC METABOLIC PANEL
Anion gap: 8 (ref 5–15)
BUN: 8 mg/dL (ref 6–20)
CO2: 27 mmol/L (ref 22–32)
CREATININE: 0.91 mg/dL (ref 0.44–1.00)
Calcium: 9.2 mg/dL (ref 8.9–10.3)
Chloride: 104 mmol/L (ref 101–111)
GFR calc Af Amer: >60 mL/min (ref >60)
GLUCOSE: 93 mg/dL (ref 65–99)
Potassium: 3.9 mmol/L (ref 3.5–5.1)
SODIUM: 139 mmol/L (ref 135–145)

## 2016-09-23 LAB — BRAIN NATRIURETIC PEPTIDE: B NATRIURETIC PEPTIDE 5: 194 pg/mL — AB (ref 0.0–100.0)

## 2016-09-23 MED ORDER — CARVEDILOL 12.5 MG PO TABS: 12.5000 mg | ORAL_TABLET | Freq: Two times a day (BID) | ORAL | 3 refills | Status: DC

## 2016-09-23 MED ORDER — SPIRONOLACTONE 25 MG PO TABS: 12.5000 mg | ORAL_TABLET | Freq: Every day | ORAL | 3 refills | Status: DC

## 2016-09-23 NOTE — Patient Instructions (Signed)
RESTART Spironolactone 25 mg tablet once daily.  INCREASE Carvedilol (Coreg) to 12.5 mg twice daily. Can "double up" on your current 6.25 mg tablets at home (Take 2 tabs twice daily). New Rx for 12.5 mg tablets has been sent to your pharmacy (Take 1 tablet twice daily).  Take EXTRA tablet of lasix for the next 2-3 days.  Routine lab work today. Will notify you of abnormal results, otherwise no news is good news!  Repeat labs in 2 weeks with PCP. Take Rx paper to your appointment.  Return in 4 weeks for follow up with heart failure clinical pharmacist Elizabeth Palau.  Follow up with Dr. Shirlee Latch and echocardiogram in 2 months.  Do the following things EVERYDAY: 1) Weigh yourself in the morning before breakfast. Write it down and keep it in a log. 2) Take your medicines as prescribed 3) Eat low salt foods-Limit salt (sodium) to 2000 mg per day.  4) Stay as active as you can everyday 5) Limit all fluids for the day to less than 2 liters

## 2016-09-23 NOTE — Progress Notes (Signed)
[name], female   DOB: 04-02-1981, 36 y.o.   MRN: [id number]    Advanced Heart Failure Clinic Note   PCP: Kirsten Wheeler: Dr Kirsten Wheeler   HPI: Kirsten Wheeler is a 36 y.o. 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 Diuresed with IV lasix and transitioned to lasix 40 mg twice a day. Had LHC with normal coronaries. Intolerant of Entresto due to hypotension. Discharge weight 320 pounds.   She returns today for regular follow up.  Weight up 12 lbs since last visit (was up 19 lbs at that visit). Taking lasix 80 mg daily instead of with alternating daily doses of 80 and 80/40. Just finished ABX for sinus infection. Stopped spironolactone after last visit when lasix was increased, but also didn't keep up with increased lasix. Lost home health, so feels like she isn't doing as well. Has been more SOB, but feels like it's her more so her congestion. Not SOB changing clothes or bathing.  + SOB with stairs or getting in a hurry. Can usually get around on flat ground without difficulty. Usually not orthopneic. Weight at home up to 342. She is a single mother with limited social support.   Echo 06/18/16 LVEF 20%, Grade 2 DD  Labs (7/17): K 3.9, creatinine 0.81, hgb 15.5  PMH:  1. Chronic systolic CHF: Nonischemic cardiomyopathy.  No family history of cardiomyopathy, no heavy ETOH, cocaine, or amphetamines.  - Echo (12/16) with EF 20-25%, moderate MR.  - Echo (5/17) with severely dilated LV, EF 10-15%, severe functional MR, mildly decreased RV systolic function. - LHC/RHC (8/67): No significant coronary disease; mean RA 15, PA 61/25 mean 41, mean PCWP 18, CI 1.7 (Fick), PVR 5.4 WU.  - Echo (10/17): EF 20%, severely dilated LV, normal RV size with mildly decreased systolic function, moderate-severe  central MR.  2. Morbid obesity.  3. 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  Echo (10/17) with moderate-severe MR.   ROS: All systems negative except as listed in HPI, PMH and Problem List.  SH:  Social History   Social History  . Marital status: Single    Spouse name: N/A  . Number of children: N/A  . Years of education: N/A   Occupational History  . Not on file.   Social History Main Topics  . Smoking status: Former Smoker    Quit date: 10/19/2015  . Smokeless tobacco: Never Used  . Alcohol use 0.0 oz/week     Comment: occasionally  . Drug use:     Types: Marijuana     Comment: only socially  . Sexual activity: Not on file   Other Topics Concern  . Not on file   Social History Narrative  . No narrative on file    FH:  Family History  Problem Relation Age of Onset  . Heart attack Mother   . Hyperlipidemia Father   . Diabetes Brother     Current Outpatient Prescriptions  Medication Sig Dispense Refill  . albuterol (PROAIR HFA) 108 (90 BASE) MCG/ACT inhaler Inhale 2 puffs into the lungs every 6 (six) hours as needed for wheezing or shortness of breath.    . carvedilol (COREG) 6.25 MG tablet Take 1.5 tablets (9.375 mg total) by mouth 2 (two) times daily with a meal. 90 tablet 3  . digoxin (LANOXIN)  0.125 MG tablet Take 1 tablet (0.125 mg total) by mouth daily. 30 tablet 3  . furosemide (LASIX) 40 MG tablet Take 80 mg by mouth daily.    Marland Kitchen losartan (COZAAR) 25 MG tablet Take 1 tablet (25 mg total) by mouth 2 (two) times daily. 60 tablet 3  . potassium chloride SA (K-DUR,KLOR-CON) 20 MEQ tablet Take 20 mEq by mouth daily.     No current facility-administered medications for this encounter.     Vitals:   09/23/16 1355  BP: (!) 144/118  Pulse: (!) 109  SpO2: 97%  Weight: (!) 344 lb 12.8 oz (156.4 kg)   Wt Readings from Last 3 Encounters:  09/23/16 (!) 344 lb 12.8 oz (156.4 kg)  06/18/16 (!) 332 lb (150.6 kg)  04/10/16 (!) 313 lb 9.6 oz (142.2 kg)       PHYSICAL EXAM: General:  Obese. NAD.   HEENT: Normal Neck: Thick. JVP 9-10 cm. Carotids 2+ bilaterally; no bruits. No thyromegaly or nodule noted. Cor: PMI normal. RRR. No rubs, or murmurs. No S3/S4.  Lungs: Clear, normal effort Abdomen: Obese, soft, NT, ND, no HSM. No bruits or masses. +BS  Extremities: no cyanosis, clubbing, rash. Trace to 1+ ankle edema.   Neuro: A&O x3, cranial nerves grossly intact. Moves all 4 extremities w/o difficulty. Affect pleasant.  ASSESSMENT & PLAN: 1.Chronic Systolic HF:  Nonischemic cardiomyopathy.  Echo 10/17 with severely dilated LV, EF 20%, moderate-severe central MR.  No history of heavy ETOH or cocaine.  No family history of cardiomyopathy.  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  - NYHA III at least symptoms.  - Volume status mildly overloaded on exam - Continue lasix 80 mg daily. Take extra 40 mg in evening for next 2-3 days and repeat as needed for weight gain.  Continue KCl 20 meq daily.  - Will plan echo for 2 months after changes below + pharmacy follow up.  If EF remains low, would consider ICD given her age.  Not CRT candidate with narrow QRS. She is too large for a cMRI. - Increase coreg to 12.5 mg BID.  - Resume spiro and increase to 25 mg daily.   - Continue digoxin 0.125 mg daily.  - Intolerant lisinopril due to cough/dizziness.  Intolerant entresto due to hypotension. She needs to take losartan daily rather than 3 times a week.  We discussed this and she will take it daily.    - Have previously discussed the need to prevent pregnancy with HF meds.  - Concerned for long term trend with low output on RHC 01/2016.  output that she had on prior RHC in 5/17.  She is too large for transplant and probably does not have good social support for LVAD.  - Continue aggressive medical management. Will plan for CPX once meds optimized.   - Reinforced fluid restriction to < 2 L daily, sodium restriction to less than 2000 mg daily, and the  importance of daily weights.   2. Suspected OSA/ Snoring:  - Getting sleep study on 09/25/16. 3.Mitral regurgitation: Moderate to severe central MR, likely functional.  4. Morbid Obesity. - Body mass index is 59.18 kg/m.  - Desperately needs to lose weight. Encouraged to watch portion size and increase activity as able.   Meds as above. Labs today and repeat 2 weeks with re-introduction of Spiro.  Follow up 4 weeks with Pharm D for med titration and 2 months for Echo. May need ICD.   Graciella Freer, PA-C  09/23/2016  Total  time spent > 25 minutes. Over half that spent discussing the above.

## 2016-10-21 ENCOUNTER — Ambulatory Visit (HOSPITAL_COMMUNITY): Payer: Medicaid Other

## 2016-11-21 ENCOUNTER — Ambulatory Visit (HOSPITAL_COMMUNITY): Admission: RE | Admit: 2016-11-21 | Payer: Medicaid Other | Source: Ambulatory Visit

## 2016-11-21 ENCOUNTER — Encounter (HOSPITAL_COMMUNITY): Payer: Self-pay

## 2016-11-21 ENCOUNTER — Encounter (HOSPITAL_COMMUNITY): Payer: Medicaid Other

## 2016-11-27 ENCOUNTER — Telehealth: Payer: Self-pay | Admitting: Licensed Clinical Social Worker

## 2016-11-27 NOTE — Telephone Encounter (Signed)
CSW referred to follow up with patient regarding multiple cancelled and no show appointments. Patient states she has medicaid although the transportation provided by [facility] is often not reliable. She reports that she returns home from appointments too late. She has two young school age daughters and needs to be home in the afternoon when they return from school and has often been late leaving them home alone. CSW suggested making earlier appointments in the clinic to allow for lag time with the medicaid transport. Patient is agreeable to give it a try with an earlier appointment time. CSW will notify HF reception to contact patient for new appointment time. Lasandra Beech, LCSW, CCSW-MCS [phone]

## 2016-12-04 ENCOUNTER — Other Ambulatory Visit (HOSPITAL_COMMUNITY): Payer: Self-pay | Admitting: Cardiology

## 2016-12-04 MED ORDER — FUROSEMIDE 40 MG PO TABS: 80.0000 mg | ORAL_TABLET | Freq: Every day | ORAL | 11 refills | Status: DC

## 2017-01-14 ENCOUNTER — Other Ambulatory Visit (HOSPITAL_COMMUNITY): Payer: Self-pay | Admitting: Internal Medicine

## 2017-01-14 DIAGNOSIS — I509 Heart failure, unspecified: Secondary | ICD-10-CM

## 2017-01-24 ENCOUNTER — Telehealth (HOSPITAL_COMMUNITY): Payer: Self-pay

## 2017-01-24 NOTE — Telephone Encounter (Signed)
Patient calling CHF clinic triage with reports of increased SOB and heart racing/pounding while laying down or going up stairs. No other s/s or changes in weight, regimen, medications, etc. Will forward to Dr. Shirlee Latch to advise. Advised to report to ED if s/s worsen.  [name], RN

## 2017-01-25 NOTE — Telephone Encounter (Signed)
Arrange follow up this week

## 2017-01-27 NOTE — Telephone Encounter (Signed)
Attempted to reach patient on both lines listed in chart, no answer, left VM on both lines to return call to schedule with [facility] or APP. Will attempt to call again later today.  [name], RN

## 2017-01-30 NOTE — Telephone Encounter (Signed)
2nd attempt made to reach patient, no answer.

## 2017-04-01 ENCOUNTER — Ambulatory Visit (HOSPITAL_COMMUNITY): Payer: Medicaid Other

## 2017-04-01 ENCOUNTER — Encounter (HOSPITAL_COMMUNITY): Payer: Medicaid Other | Admitting: Cardiology

## 2017-04-11 ENCOUNTER — Inpatient Hospital Stay (HOSPITAL_COMMUNITY)
Admission: AD | Admit: 2017-04-11 | Discharge: 2017-04-22 | DRG: 769 | Disposition: A | Discharge status: Home Health | Payer: Medicaid Other | Source: Other Acute Inpatient Hospital | Attending: Cardiology | Admitting: Cardiology

## 2017-04-11 DIAGNOSIS — Z6841 Body Mass Index (BMI) 40.0 and over, adult: Secondary | ICD-10-CM | POA: Diagnosis not present

## 2017-04-11 DIAGNOSIS — I272 Pulmonary hypertension, unspecified: Secondary | ICD-10-CM | POA: Diagnosis present

## 2017-04-11 DIAGNOSIS — I313 Pericardial effusion (noninflammatory): Secondary | ICD-10-CM | POA: Diagnosis not present

## 2017-04-11 DIAGNOSIS — O9953 Diseases of the respiratory system complicating the puerperium: Secondary | ICD-10-CM | POA: Diagnosis present

## 2017-04-11 DIAGNOSIS — G4733 Obstructive sleep apnea (adult) (pediatric): Secondary | ICD-10-CM | POA: Diagnosis present

## 2017-04-11 DIAGNOSIS — O99215 Obesity complicating the puerperium: Secondary | ICD-10-CM | POA: Diagnosis present

## 2017-04-11 DIAGNOSIS — I509 Heart failure, unspecified: Secondary | ICD-10-CM | POA: Diagnosis not present

## 2017-04-11 DIAGNOSIS — I5022 Chronic systolic (congestive) heart failure: Secondary | ICD-10-CM

## 2017-04-11 DIAGNOSIS — I428 Other cardiomyopathies: Secondary | ICD-10-CM

## 2017-04-11 DIAGNOSIS — I5023 Acute on chronic systolic (congestive) heart failure: Secondary | ICD-10-CM | POA: Diagnosis not present

## 2017-04-11 DIAGNOSIS — I5043 Acute on chronic combined systolic (congestive) and diastolic (congestive) heart failure: Secondary | ICD-10-CM

## 2017-04-11 DIAGNOSIS — Z87891 Personal history of nicotine dependence: Secondary | ICD-10-CM

## 2017-04-11 DIAGNOSIS — Z79899 Other long term (current) drug therapy: Secondary | ICD-10-CM

## 2017-04-11 DIAGNOSIS — Z95818 Presence of other cardiac implants and grafts: Secondary | ICD-10-CM

## 2017-04-11 DIAGNOSIS — R55 Syncope and collapse: Secondary | ICD-10-CM | POA: Diagnosis not present

## 2017-04-11 DIAGNOSIS — O903 Peripartum cardiomyopathy: Secondary | ICD-10-CM | POA: Diagnosis present

## 2017-04-11 DIAGNOSIS — T82120A Displacement of cardiac electrode, initial encounter: Secondary | ICD-10-CM | POA: Diagnosis not present

## 2017-04-11 DIAGNOSIS — R42 Dizziness and giddiness: Secondary | ICD-10-CM

## 2017-04-11 DIAGNOSIS — J45909 Unspecified asthma, uncomplicated: Secondary | ICD-10-CM | POA: Diagnosis present

## 2017-04-11 DIAGNOSIS — I340 Nonrheumatic mitral (valve) insufficiency: Secondary | ICD-10-CM | POA: Diagnosis present

## 2017-04-11 DIAGNOSIS — I429 Cardiomyopathy, unspecified: Secondary | ICD-10-CM | POA: Diagnosis not present

## 2017-04-11 LAB — CBC WITH DIFFERENTIAL/PLATELET
Basophils Absolute: 0 10*3/uL (ref 0.0–0.1)
Basophils Relative: 0 %
EOS PCT: 1 %
Eosinophils Absolute: 0 10*3/uL (ref 0.0–0.7)
HCT: 37.8 % (ref 36.0–46.0)
Hemoglobin: 12.7 g/dL (ref 12.0–15.0)
LYMPHS ABS: 1.6 10*3/uL (ref 0.7–4.0)
LYMPHS PCT: 24 %
MCH: 25.3 pg — AB (ref 26.0–34.0)
MCHC: 33.6 g/dL (ref 30.0–36.0)
MCV: 75.3 fL — AB (ref 78.0–100.0)
Monocytes Absolute: 0.3 10*3/uL (ref 0.1–1.0)
Monocytes Relative: 4 %
Neutro Abs: 4.8 10*3/uL (ref 1.7–7.7)
Neutrophils Relative %: 71 %
PLATELETS: 330 10*3/uL (ref 150–400)
RBC: 5.02 MIL/uL (ref 3.87–5.11)
RDW: 16.2 % — ABNORMAL HIGH (ref 11.5–15.5)
WBC: 6.8 10*3/uL (ref 4.0–10.5)

## 2017-04-11 LAB — COMPREHENSIVE METABOLIC PANEL
ALBUMIN: 3.3 g/dL — AB (ref 3.5–5.0)
ALT: 15 U/L (ref 14–54)
AST: 23 U/L (ref 15–41)
Alkaline Phosphatase: 61 U/L (ref 38–126)
Anion gap: 11 (ref 5–15)
BUN: 11 mg/dL (ref 6–20)
CHLORIDE: 99 mmol/L — AB (ref 101–111)
CO2: 27 mmol/L (ref 22–32)
Calcium: 9.3 mg/dL (ref 8.9–10.3)
Creatinine, Ser: 0.99 mg/dL (ref 0.44–1.00)
GFR calc Af Amer: >60 mL/min (ref >60)
GLUCOSE: 112 mg/dL — AB (ref 65–99)
POTASSIUM: 3.7 mmol/L (ref 3.5–5.1)
SODIUM: 137 mmol/L (ref 135–145)
Total Bilirubin: 4.1 mg/dL — ABNORMAL HIGH (ref 0.3–1.2)
Total Protein: 7.8 g/dL (ref 6.5–8.1)

## 2017-04-11 LAB — BRAIN NATRIURETIC PEPTIDE: B Natriuretic Peptide: 722.5 pg/mL — ABNORMAL HIGH (ref 0.0–100.0)

## 2017-04-11 MED ORDER — SODIUM CHLORIDE 0.9% FLUSH: 3.0000 mL | INTRAVENOUS | Status: DC | PRN

## 2017-04-11 MED ORDER — SODIUM CHLORIDE 0.9 % IV SOLN: 250.0000 mL | INTRAVENOUS | Status: DC | PRN

## 2017-04-11 MED ORDER — FUROSEMIDE 10 MG/ML IJ SOLN
80.0000 mg | Freq: Once | INTRAMUSCULAR | Status: AC
  Administered 2017-04-11: 80 mg via INTRAVENOUS
  Filled 2017-04-11: qty 8

## 2017-04-11 MED ORDER — DIGOXIN 125 MCG PO TABS
0.1250 mg | ORAL_TABLET | Freq: Every day | ORAL | Status: DC
  Administered 2017-04-12 – 2017-04-22 (×11): 0.125 mg via ORAL
  Filled 2017-04-11 (×11): qty 1

## 2017-04-11 MED ORDER — LOSARTAN POTASSIUM 25 MG PO TABS
25.0000 mg | ORAL_TABLET | Freq: Two times a day (BID) | ORAL | Status: DC
  Administered 2017-04-12 – 2017-04-13 (×3): 25 mg via ORAL
  Filled 2017-04-11 (×3): qty 1

## 2017-04-11 MED ORDER — HEPARIN SODIUM (PORCINE) 5000 UNIT/ML IJ SOLN
5000.0000 [IU] | Freq: Three times a day (TID) | INTRAMUSCULAR | Status: DC
  Administered 2017-04-11 – 2017-04-14 (×8): 5000 [IU] via SUBCUTANEOUS
  Filled 2017-04-11 (×8): qty 1

## 2017-04-11 MED ORDER — ONDANSETRON HCL 4 MG/2ML IJ SOLN: 4.0000 mg | Freq: Four times a day (QID) | INTRAMUSCULAR | Status: DC | PRN

## 2017-04-11 MED ORDER — CARVEDILOL 12.5 MG PO TABS
12.5000 mg | ORAL_TABLET | Freq: Two times a day (BID) | ORAL | Status: DC
  Administered 2017-04-12 – 2017-04-13 (×3): 12.5 mg via ORAL
  Filled 2017-04-11 (×3): qty 1

## 2017-04-11 MED ORDER — ACETAMINOPHEN 325 MG PO TABS
650.0000 mg | ORAL_TABLET | ORAL | Status: DC | PRN
  Administered 2017-04-15: 650 mg via ORAL
  Filled 2017-04-11 (×2): qty 2

## 2017-04-11 MED ORDER — ACETAMINOPHEN 325 MG PO TABS
650.0000 mg | ORAL_TABLET | ORAL | Status: DC | PRN
  Administered 2017-04-12: 650 mg via ORAL

## 2017-04-11 MED ORDER — SPIRONOLACTONE 25 MG PO TABS
12.5000 mg | ORAL_TABLET | Freq: Every day | ORAL | Status: DC
  Administered 2017-04-12 – 2017-04-13 (×2): 12.5 mg via ORAL
  Filled 2017-04-11 (×2): qty 1

## 2017-04-11 MED ORDER — ALBUTEROL SULFATE (2.5 MG/3ML) 0.083% IN NEBU
3.0000 mL | INHALATION_SOLUTION | Freq: Four times a day (QID) | RESPIRATORY_TRACT | Status: DC | PRN
Start: 2017-04-11 — End: 2017-04-22
  Administered 2017-04-19: 3 mL via RESPIRATORY_TRACT
  Filled 2017-04-11: qty 3

## 2017-04-11 MED ORDER — POTASSIUM CHLORIDE CRYS ER 20 MEQ PO TBCR
20.0000 meq | EXTENDED_RELEASE_TABLET | Freq: Every day | ORAL | Status: DC
  Administered 2017-04-12 – 2017-04-14 (×3): 20 meq via ORAL
  Filled 2017-04-11 (×4): qty 1

## 2017-04-11 MED ORDER — SODIUM CHLORIDE 0.9% FLUSH
3.0000 mL | Freq: Two times a day (BID) | INTRAVENOUS | Status: DC
  Administered 2017-04-11 – 2017-04-22 (×17): 3 mL via INTRAVENOUS

## 2017-04-11 NOTE — H&P (Signed)
CARDIOLOGY H&P  HPI: 36 y.o. 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   The patient is followed by Dr Shirlee Latch, though she reports that she "is not a very good patient" given that she misses appointments somewhat frequently. She reports that over the past few weeks she has been taking more lasix, up to 4 tablets per day due to increasing abdominal distention and bilateral lower extremity edema. When she takes 2 or 3 tablets per day, she notices that she doesn't urinate much at all. When she takes 4 tablets though she urinates frequently for the next several hours. She has not been adherent to a salt or fluid restricted diet. She notes that she takes all of her other medications regularly. She has been sleeping "on her knees bent over the edge of her bed" due to inability to lie flat. She lives a very sedentary life, given her cardiac condition as well as her morbid obesity. She feels as though her weight has been changing rapidly and drastically, but she does not weigh herself regularly at home.  Early today, she notes that she was sitting at her table and she suddenly felt dizzy and lightheaded. She felt like she was having an "out of body experience." She tried to walk to the door but she was very unsteady on her feet. Her partner was with her at the time and noted that her speech was very slurred. She denied acute shortness of breath, chest pain, or palpitations at the time. She was brought to the ED and underwent a head scan, which was without evidence of acute abnormality. She was then transferred to [facility] for further management.   Ms. Alterio notes that her episode of dizziness/lightheadedness lasted for about 10 minutes total, but the slurred speech lasted for about 2 hours. She feels mostly back to normal now, though does report feeling that she has a lot of extra fluid. She reports shortness of breath with minimal exertion and is unable  to lay back at less than a 45 degree angle.   Review of Systems:     Cardiac Review of Systems: {Y] = yes [ ]  = no  Chest Pain [    ]  Resting SOB [  Y ] Exertional SOB  [ Y ]  Orthopnea [ Y ]   Pedal Edema [  Y ]    Palpitations [  ] Syncope  [  ]   Presyncope [  Y ]  General Review of Systems: [Y] = yes [  ]=no Constitional: recent weight change [ Y ]; anorexia [  ]; fatigue [ Y ]; nausea [  ]; night sweats [  ]; fever [  ]; or chills [  ];                                                                     Dental: poor dentition[  ];   Eye : blurred vision [  ]; diplopia [   ]; vision changes [  ];  Amaurosis fugax[  ]; Resp: cough [ Y ];  wheezing[  ];  hemoptysis[  ]; shortness of breath[  ]; paroxysmal nocturnal dyspnea[ Y ]; dyspnea on exertion[ Y ]; or orthopnea[  Y ];  GI:  gallstones[  ], vomiting[  ];  dysphagia[  ]; melena[  ];  hematochezia [  ]; heartburn[  ];   GU: kidney stones [  ]; hematuria[  ];   dysuria [  ];  nocturia[  ];               Skin: rash [  ], swelling[ Y ];, hair loss[  ];  peripheral edema[ Y ];  or itching[  ]; Musculosketetal: myalgias[  ];  joint swelling[  ];  joint erythema[  ];  joint pain[  ];  back pain[  ];  Heme/Lymph: bruising[  ];  bleeding[  ];  anemia[  ];  Neuro: TIA[  ];  headaches[  ];  stroke[  ];  vertigo[  ];  seizures[  ];   paresthesias[  ];  difficulty walking[  ];  Psych:depression[  ]; anxiety[  ];  Endocrine: diabetes[  ];  thyroid dysfunction[  ];  Other:  Past Medical History:  Diagnosis Date  . Asthma   . Chronic systolic CHF (congestive heart failure) (HCC)    a. ECHO 01/15/2016 EF 10-15%. 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  Marland Kitchen Hearing loss    bilateral  . Mitral regurgitation    a. severe, felt to be functional 2/2 LV dilation   . Morbid obesity (HCC)   . Snoring     Prior to Admission medications   Medication Sig Start Date End Date Taking? Authorizing Provider  albuterol (PROAIR HFA) 108 (90 BASE) MCG/ACT  inhaler Inhale 2 puffs into the lungs every 6 (six) hours as needed for wheezing or shortness of breath.    [provider]  carvedilol (COREG) 12.5 MG tablet Take 1 tablet (12.5 mg total) by mouth 2 (two) times daily with a meal. 09/23/16   Graciella Freer, PA-C  DIGOX 125 MCG tablet TAKE 1 TABLET(0.125 MG) BY MOUTH DAILY 01/15/17   Laurey Morale, MD  furosemide (LASIX) 40 MG tablet Take 2 tablets (80 mg total) by mouth daily. 12/04/16   Bensimhon, Bevelyn Buckles, MD  losartan (COZAAR) 25 MG tablet Take 1 tablet (25 mg total) by mouth 2 (two) times daily. 06/18/16   Laurey Morale, MD  potassium chloride SA (K-DUR,KLOR-CON) 20 MEQ tablet Take 20 mEq by mouth daily.    [provider]  spironolactone (ALDACTONE) 25 MG tablet Take 0.5 tablets (12.5 mg total) by mouth daily. 09/23/16   Graciella Freer, PA-C    No Known Allergies  Social History   Social History  . Marital status: Single    Spouse name: N/A  . Number of children: N/A  . Years of education: N/A   Occupational History  . Not on file.   Social History Main Topics  . Smoking status: Former Smoker    Quit date: 10/19/2015  . Smokeless tobacco: Never Used  . Alcohol use 0.0 oz/week     Comment: occasionally  . Drug use: Yes    Types: Marijuana     Comment: only socially  . Sexual activity: Not on file   Other Topics Concern  . Not on file   Social History Narrative  . No narrative on file    Family History  Problem Relation Age of Onset  . Heart attack Mother   . Hyperlipidemia Father   . Diabetes Brother     PHYSICAL EXAM: Vitals:   04/11/17 2100  BP: (!) 136/106  Pulse: 100  Resp: 20  Temp: 98.1 F (36.7 C)   General:  Comfortable appearing. Tachypnea with minimal activity such as speaking or changing positions in bed.  HEENT: normal Neck: supple. JVP elevated to about 10cm H2O. Carotids 2+ bilat; no bruits. No lymphadenopathy or thryomegaly appreciated. Cor: PMI  nondisplaced. Regular rate. Summation gallop.  Lungs: faint wheezing heard bilaterally throughout Abdomen: obese, soft, nontender. No hepatosplenomegaly. No bruits or masses. Good bowel sounds. Extremities: pitting edema in the BLE past the knee Neuro: alert & oriented x 3, cranial nerves grossly intact. moves all 4 extremities w/o difficulty. Affect pleasant.  ECG: sinus tachycardia, HR 100, normal axis, diffuse nonspecific T wave abnormalities  Results for orders placed or performed during the hospital encounter of 04/11/17 (from the past 24 hour(s))  Comprehensive metabolic panel     Status: Abnormal   Collection Time: 04/11/17 10:29 PM  Result Value Ref Range   Sodium 137 135 - 145 mmol/L   Potassium 3.7 3.5 - 5.1 mmol/L   Chloride 99 (L) 101 - 111 mmol/L   CO2 27 22 - 32 mmol/L   Glucose, Bld 112 (H) 65 - 99 mg/dL   BUN 11 6 - 20 mg/dL   Creatinine, Ser 1.61 0.44 - 1.00 mg/dL   Calcium 9.3 8.9 - 09.6 mg/dL   Total Protein 7.8 6.5 - 8.1 g/dL   Albumin 3.3 (L) 3.5 - 5.0 g/dL   AST 23 15 - 41 U/L   ALT 15 14 - 54 U/L   Alkaline Phosphatase 61 38 - 126 U/L   Total Bilirubin 4.1 (H) 0.3 - 1.2 mg/dL   GFR calc non Af Amer >60 >60 mL/min   GFR calc Af Amer >60 >60 mL/min   Anion gap 11 5 - 15  CBC WITH DIFFERENTIAL     Status: Abnormal   Collection Time: 04/11/17 10:29 PM  Result Value Ref Range   WBC 6.8 4.0 - 10.5 K/uL   RBC 5.02 3.87 - 5.11 MIL/uL   Hemoglobin 12.7 12.0 - 15.0 g/dL   HCT 04.5 40.9 - 81.1 %   MCV 75.3 (L) 78.0 - 100.0 fL   MCH 25.3 (L) 26.0 - 34.0 pg   MCHC 33.6 30.0 - 36.0 g/dL   RDW 91.4 (H) 78.2 - 95.6 %   Platelets 330 150 - 400 K/uL   Neutrophils Relative % 71 %   Neutro Abs 4.8 1.7 - 7.7 K/uL   Lymphocytes Relative 24 %   Lymphs Abs 1.6 0.7 - 4.0 K/uL   Monocytes Relative 4 %   Monocytes Absolute 0.3 0.1 - 1.0 K/uL   Eosinophils Relative 1 %   Eosinophils Absolute 0.0 0.0 - 0.7 K/uL   Basophils Relative 0 %   Basophils Absolute 0.0 0.0 - 0.1  K/uL  Brain natriuretic peptide     Status: Abnormal   Collection Time: 04/11/17 10:29 PM  Result Value Ref Range   B Natriuretic Peptide 722.5 (H) 0.0 - 100.0 pg/mL   No results found.  ASSESSMENT: 36 y.o. 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   PLAN/DISCUSSION: #) Dizziness/lightheadedness: symptoms very concerning for possible arrhythmia, the most worrisome of which would be VT/VF. She is at risk for any number of malignant arrhythmias given her poor underlying systolic function, as well as at risk for atrial arrhythmias given her many risk factors. She did not check her heart rate during this episode, however denies palpitations. She was not noted to have any  tacharrhythmias at the OSH ED despite endorsing continued symptoms while there. She notes that she has had discussions about having a primary prevention ICD placed in the past, though no definitive plan was put in place for this.  - continuous telemetry - continue home carvedilol, digoxin - close monitoring/correction of electrolytes - will need to discuss utility of outpatient rhythm monitor vs primary prevention ICD placement in the near future   #) Postpartum cardiomyopathy: patient has severe biventricular dysfunction that has only minimally improved despite being on reasonable medical therapy. She has evidence of acute decompensated heart failure and worsening diuretic resistance at home. Her clinical volume status is admittedly difficult to ascertain given her body habitus.  - diuresis w/ IV lasix, will give 80mg  IV tonight - reassess response to IV lasix as well as volume status in AM - close monitoring/correction of electrolytes - continue home spironolactone, losartan - continue home digoxin, carvedilol  Fatima Blank, MD Cardiology Fellow

## 2017-04-12 ENCOUNTER — Other Ambulatory Visit (HOSPITAL_COMMUNITY): Payer: Medicaid Other

## 2017-04-12 ENCOUNTER — Inpatient Hospital Stay (HOSPITAL_COMMUNITY): Payer: Medicaid Other

## 2017-04-12 ENCOUNTER — Encounter (HOSPITAL_COMMUNITY): Payer: Self-pay

## 2017-04-12 DIAGNOSIS — I509 Heart failure, unspecified: Secondary | ICD-10-CM

## 2017-04-12 DIAGNOSIS — R55 Syncope and collapse: Secondary | ICD-10-CM

## 2017-04-12 LAB — BASIC METABOLIC PANEL
ANION GAP: 10 (ref 5–15)
BUN: 13 mg/dL (ref 6–20)
CHLORIDE: 99 mmol/L — AB (ref 101–111)
CO2: 27 mmol/L (ref 22–32)
CREATININE: 0.98 mg/dL (ref 0.44–1.00)
Calcium: 8.9 mg/dL (ref 8.9–10.3)
GFR calc non Af Amer: >60 mL/min (ref >60)
Glucose, Bld: 84 mg/dL (ref 65–99)
Potassium: 3.7 mmol/L (ref 3.5–5.1)
Sodium: 136 mmol/L (ref 135–145)

## 2017-04-12 LAB — MAGNESIUM: Magnesium: 2.2 mg/dL (ref 1.7–2.4)

## 2017-04-12 LAB — HIV ANTIBODY (ROUTINE TESTING W REFLEX): HIV Screen 4th Generation wRfx: NONREACTIVE

## 2017-04-12 MED ORDER — FUROSEMIDE 10 MG/ML IJ SOLN
80.0000 mg | Freq: Once | INTRAMUSCULAR | Status: AC
  Administered 2017-04-12: 80 mg via INTRAVENOUS
  Filled 2017-04-12: qty 8

## 2017-04-12 NOTE — Progress Notes (Signed)
80mg  Lasix IV stat and other morning pills provided after pt  finished her breakfast on her request as she takes her morning medicine at home on this time

## 2017-04-12 NOTE — Progress Notes (Addendum)
Pt is stable, vitals stable, no any complain of chest pain, Diuresing well, have a good appetite, have a complain of IV site pain, will ask IV team consultation to have a second opinion, will continue to monitor

## 2017-04-12 NOTE — Progress Notes (Signed)
Pt called RN during 7pm upon having SOB, Spo2 was 98%, positioning maintained, pt felt better, will continue to monitor

## 2017-04-12 NOTE — Progress Notes (Signed)
Progress Note  [name] Date of Encounter: 04/12/2017  Primary Cardiologist: Dr. Shirlee Latch  Subjective   36 y.o. 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 There was concern that she may be having VT/VF arrythmia causing near syncopal episodes. On Telemetry screen she did have one episode of a 8 beat run of Vtach at 6:08 am this morning. Otherwise unremarkable study. She has diuresed very well with her one dose of 80 mg IV Lasix. She says she has been taking 160 mg PO lasix at home. Admits to being a non compliant patient.   Has not yet been weighed, will have nursing weigh pt. Repeat echocardiogram pending for this admission.   Blood pressure 117/84, pulse 90, temperature (!) 97.3 F (36.3 C), temperature source Oral, resp. rate 20, height 5\' 5"  (1.651 m), weight (!) 346 lb 9.6 oz (157.2 kg), SpO2 97 %.   Inpatient Medications    Scheduled Meds: . carvedilol  12.5 mg Oral BID WC  . digoxin  0.125 mg Oral Daily  . furosemide  80 mg Intravenous Once  . heparin  5,000 Units Subcutaneous Q8H  . losartan  25 mg Oral BID  . potassium chloride SA  20 mEq Oral Daily  . sodium chloride flush  3 mL Intravenous Q12H  . spironolactone  12.5 mg Oral Daily   Continuous Infusions: . sodium chloride     PRN Meds: sodium chloride, acetaminophen, albuterol, ondansetron (ZOFRAN) IV, sodium chloride flush   Vital Signs    Vitals:   04/11/17 2100 04/11/17 2215 04/12/17 0602  BP: (!) 136/106  117/84  Pulse: 100  90  Resp: 20  20  Temp: 98.1 F (36.7 C)  (!) 97.3 F (36.3 C)  TempSrc: Oral  Oral  SpO2: 94% 94% 97%  Weight: (!) 346 lb 9.6 oz (157.2 kg)    Height: 5\' 5"  (1.651 m)      Intake/Output Summary (Last 24 hours) at 04/12/17 0819 Last data filed at 04/12/17 4098  Gross per 24 hour  Intake              127 ml  Output             3900 ml  Net            -3773 ml   Filed Weights   04/11/17 2100  Weight: (!) 346 lb 9.6 oz (157.2 kg)    Telemetry    NSR, one episode of 8 run of Vtach - Personally Reviewed   Physical Exam   GEN: Well nourished, well developed, morbidly obese HEENT: normal  Neck: unable to assess for JVD due to body habitus Cardiac: RRR. no murmurs, rubs, or gallops, + le edema Respiratory: clear to auscultation bilaterally, normal work of breathing GI: soft, nontender, nondistended, + BS Kirsten: no deformity or atrophy  Skin: warm and dry, no rash Neuro: Alert and Oriented x 3, Strength and sensation are intact Psych:   Full affect  Labs    Chemistry Recent Labs Lab 04/11/17 2229 04/12/17 0447  NA 137 136  K 3.7 3.7  CL 99* 99*  CO2 27 27  GLUCOSE 112* 84  BUN 11 13  CREATININE 0.99 0.98  CALCIUM 9.3 8.9  PROT 7.8  --   ALBUMIN 3.3*  --   AST 23  --  ALT 15  --   ALKPHOS 61  --   BILITOT 4.1*  --   GFRNONAA >60 >60  GFRAA >60 >60  ANIONGAP 11 10     Hematology Recent Labs Lab 04/11/17 2229  WBC 6.8  RBC 5.02  HGB 12.7  HCT 37.8  MCV 75.3*  MCH 25.3*  MCHC 33.6  RDW 16.2*  PLT 330    Cardiac EnzymesNo results for input(s): TROPONINI in the last 168 hours. No results for input(s): TROPIPOC in the last 168 hours.   BNP Recent Labs Lab 04/11/17 2229  BNP 722.5*     DDimer No results for input(s): DDIMER in the last 168 hours.   Radiology    No results found.  Cardiac Studies   Echocardiogram pending this admission   Transthoracic Echocardiography 06/18/2016 Study Conclusions  - Left ventricle: The cavity size was severely dilated. Wall   thickness was normal. The estimated ejection fraction was 20%.   Diffuse hypokinesis. Features are consistent with a pseudonormal   left ventricular filling pattern, with concomitant abnormal   relaxation and increased filling pressure  (grade 2 diastolic   dysfunction). - Aortic valve: There was no stenosis. - Mitral valve: There was moderate to severe central regurgitation. - Left atrium: The atrium was moderately dilated. - Right ventricle: The cavity size was normal. Systolic function   was mildly reduced. - Right atrium: The atrium was mildly dilated. - Tricuspid valve: Peak RV-RA gradient (S): 43 mm Hg. - Pulmonary arteries: PA peak pressure: 46 mm Hg (S). - Inferior vena cava: The vessel was normal in size. The   respirophasic diameter changes were in the normal range (>= 50%),   consistent with normal central venous pressure.  Impressions:  - Severely dilated LV with EF 20%, diffuse hypokinesis. Normal RV   size with mildly decreased systolic function. 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 Mild   pulmonary hypertension. Patient Profile     36 y.o. 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   Assessment & Plan    1.  Dizziness/lightheadedness: She has not had any repeat episodes of dizziness, the telemetry screen shows one episode of Vtach up to 8 beats but this Is likely not enough to cause symptoms. Repeat echocardiogram is pending to evaluate EF status, given her difficulty in controlling her fluid levels at home with large doses of IV lasix I  Am concerned it is still low. If she continues to have arrhythmias she may been outpatient monitor vs ICD placement. -- continue tele, cont home carvedilol and digoxin, electrolytes are WNL. Will add on a magnesium level.  2. Postpartum cardiomyopathy: Concern for worsening heart failure. She has been resistant to lasix at home. Repeat echo is pending. She diuresed well on 80 mg IV lasix overnight.  She has diuresed lost ~ 4 L of fluid, creatinine same as yesterday. Will order another dose of IV lasix 80 mg. Weight 346 lb, unclear of dry weight. Will have pt weighed again  today. -- need repeat echo for further plan. Will continue to diurese.   Suan Halter, PA-C  04/12/2017, 8:19 AM    Pt seen and examined  I agree with findings as noted by Dinah Beers  1 Syncope:  COncerning   Pt never had a spell like it before  Sudden on set  COntinue tele  Repeat echo pending  Will review with EP  2  CHF  Pt  decompensated  Sounds like this has been prgressing for awhile  Very limited activity without symtpms  Diurese with IV lasix    Will review wit hCHF service  Missed last appt  Did nto f/u for echo  As outpt  Seen in Fall 2017.  Dietrich Pates

## 2017-04-12 NOTE — Plan of Care (Signed)
Problem: Food- and Nutrition-Related Knowledge Deficit (NB-1.1) Goal: Nutrition education Formal process to instruct or train a patient/client in a skill or to impart knowledge to help patients/clients voluntarily manage or modify food choices and eating behavior to maintain or improve health. Outcome: Completed/Met Date Met: 04/12/17 Nutrition Education Note  RD consulted for nutrition education regarding new onset CHF.  RD provided "Heart Failure Nutrition Therapy" handout from the [employer]. Reviewed patient's dietary recall. Provided examples on ways to decrease sodium intake in diet. Discouraged intake of processed foods and use of salt shaker. Encouraged fresh fruits and vegetables as well as whole grain sources of carbohydrates to maximize fiber intake.   RD discussed why it is important for patient to adhere to diet recommendations, and emphasized the role of fluids, foods to avoid, and importance of weighing self daily. Teach back method used.  Expect good compliance.  Body mass index is 57.68 kg/m. Pt meets criteria for morbid obesity based on current BMI. Encouraged weight loss, provided handout "General, Healthy Eating" from AND to pt as well.   Current diet order is Heart Healthy, patient is consuming approximately 100% of meals at this time. Labs and medications reviewed. No further nutrition interventions warranted at this time. RD contact information provided. If additional nutrition issues arise, please re-consult RD.   Kerman Passey MS, RD, LDN [phone] Pager  [phone] Weekend/On-Call Pager

## 2017-04-13 ENCOUNTER — Inpatient Hospital Stay (HOSPITAL_COMMUNITY): Payer: Medicaid Other

## 2017-04-13 DIAGNOSIS — I5023 Acute on chronic systolic (congestive) heart failure: Secondary | ICD-10-CM

## 2017-04-13 DIAGNOSIS — I313 Pericardial effusion (noninflammatory): Secondary | ICD-10-CM

## 2017-04-13 LAB — ECHOCARDIOGRAM COMPLETE
Ao-asc: 28 cm
CHL CUP MV DEC (S): 158
E decel time: 158 msec
FS: 13 % — AB (ref 28–44)
HEIGHTINCHES: 65 in
IVS/LV PW RATIO, ED: 1.08
LA ID, A-P, ES: 62 mm
LA vol index: 40.3 mL/m2
LA vol: 112 mL
LADIAMINDEX: 2.23 cm/m2
LAVOLA4C: 101 mL
LDCA: 2.27 cm2
LEFT ATRIUM END SYS DIAM: 62 mm
LVOTD: 17 mm
MV Peak grad: 8 mmHg
MV pk A vel: 73.7 m/s
MV pk E vel: 143 m/s
PISA EROA: 0.13 cm2
PW: 10.2 mm — AB (ref 0.6–1.1)
RV TAPSE: 26.6 mm
VTI: 106 cm
WEIGHTICAEL: 5520 [oz_av]

## 2017-04-13 LAB — BASIC METABOLIC PANEL
ANION GAP: 8 (ref 5–15)
BUN: 16 mg/dL (ref 6–20)
CALCIUM: 9.2 mg/dL (ref 8.9–10.3)
CO2: 28 mmol/L (ref 22–32)
Chloride: 101 mmol/L (ref 101–111)
Creatinine, Ser: 1.1 mg/dL — ABNORMAL HIGH (ref 0.44–1.00)
Glucose, Bld: 109 mg/dL — ABNORMAL HIGH (ref 65–99)
Potassium: 3.7 mmol/L (ref 3.5–5.1)
Sodium: 137 mmol/L (ref 135–145)

## 2017-04-13 MED ORDER — POTASSIUM CHLORIDE CRYS ER 20 MEQ PO TBCR
40.0000 meq | EXTENDED_RELEASE_TABLET | Freq: Once | ORAL | Status: AC
  Administered 2017-04-13: 40 meq via ORAL
  Filled 2017-04-13: qty 2

## 2017-04-13 MED ORDER — SPIRONOLACTONE 25 MG PO TABS
25.0000 mg | ORAL_TABLET | Freq: Every day | ORAL | Status: DC
  Administered 2017-04-14 – 2017-04-22 (×9): 25 mg via ORAL
  Filled 2017-04-13 (×9): qty 1

## 2017-04-13 MED ORDER — CARVEDILOL 6.25 MG PO TABS
6.2500 mg | ORAL_TABLET | Freq: Two times a day (BID) | ORAL | Status: DC
  Administered 2017-04-13 – 2017-04-14 (×3): 6.25 mg via ORAL
  Filled 2017-04-13 (×3): qty 1

## 2017-04-13 MED ORDER — SACUBITRIL-VALSARTAN 24-26 MG PO TABS
1.0000 | ORAL_TABLET | Freq: Two times a day (BID) | ORAL | Status: DC
  Administered 2017-04-13 – 2017-04-22 (×18): 1 via ORAL
  Filled 2017-04-13 (×19): qty 1

## 2017-04-13 MED ORDER — PERFLUTREN LIPID MICROSPHERE
1.0000 mL | INTRAVENOUS | Status: AC | PRN
  Administered 2017-04-13: 4 mL via INTRAVENOUS
  Filled 2017-04-13: qty 10

## 2017-04-13 MED ORDER — FUROSEMIDE 10 MG/ML IJ SOLN
80.0000 mg | Freq: Two times a day (BID) | INTRAMUSCULAR | Status: DC
  Administered 2017-04-13 – 2017-04-16 (×6): 80 mg via INTRAVENOUS
  Filled 2017-04-13 (×6): qty 8

## 2017-04-13 NOTE — Progress Notes (Signed)
ADVANCED HF Team Progress Note  [name] Date of Encounter: 04/13/2017  Primary Cardiologist: Dr. Shirlee Latch  Subjective   36 y.o. 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  Feels more SOB this am after lasix stopped. + orthopnea. Weight down 1 pound. Denies CP or N/V. nofurtehr syncope,    Echo being done at bedside. LVEF ~20%. RV mild HK   Blood pressure 117/84, pulse 90, temperature (!) 97.3 F (36.3 C), temperature source Oral, resp. rate 20, height 5\' 5"  (1.651 m), weight (!) 346 lb 9.6 oz (157.2 kg), SpO2 97 %.   Inpatient Medications    Scheduled Meds: . carvedilol  12.5 mg Oral BID WC  . digoxin  0.125 mg Oral Daily  . heparin  5,000 Units Subcutaneous Q8H  . losartan  25 mg Oral BID  . potassium chloride SA  20 mEq Oral Daily  . sodium chloride flush  3 mL Intravenous Q12H  . spironolactone  12.5 mg Oral Daily   Continuous Infusions: . sodium chloride     PRN Meds: sodium chloride, acetaminophen, albuterol, ondansetron (ZOFRAN) IV, perflutren lipid microspheres (DEFINITY) IV suspension, sodium chloride flush   Vital Signs    Vitals:   04/12/17 1900 04/12/17 2107 04/13/17 0500 04/13/17 0910  BP:  129/77 117/72   Pulse:  92 94 88  Resp:  18 18   Temp:  98.4 F (36.9 C) 98.4 F (36.9 C)   TempSrc:  Oral Oral   SpO2: 97% 95% 99%   Weight:   (!) 156.5 kg (345 lb)   Height:        Intake/Output Summary (Last 24 hours) at 04/13/17 1235 Last data filed at 04/13/17 1208  Gross per 24 hour  Intake              960 ml  Output             1800 ml  Net             -840 ml   Filed Weights   04/11/17 2100 04/13/17 0500  Weight: (!) 157.2 kg (346 lb 9.6 oz) (!) 156.5 kg (345 lb)    Telemetry    NSR 90s Personally reviewed    Physical Exam   General:  Lying in bed. +dyspnea and tachypnea HEENT: normal Neck: supple thick. JVP hard to see  Carotids 2+ bilat; no bruits. No  lymphadenopathy or thryomegaly appreciated. Cor: PMI nonpalpable . Regular rate & rhythm. Probable s3 Lungs: clear anteriorly Abdomen: markedly obese soft, nontender, nondistended. No hepatosplenomegaly. No bruits or masses. Good bowel sounds. Extremities: no cyanosis, clubbing, rash, 1+  edema Neuro: alert & orientedx3, cranial nerves grossly intact. moves all 4 extremities w/o difficulty. Affect pleasant   Labs    Chemistry  Recent Labs Lab 04/11/17 2229 04/12/17 0447 04/13/17 0449  NA 137 136 137  K 3.7 3.7 3.7  CL 99* 99* 101  CO2 27 27 28   GLUCOSE 112* 84 109*  BUN 11 13 16   CREATININE 0.99 0.98 1.10*  CALCIUM 9.3 8.9 9.2  PROT 7.8  --   --   ALBUMIN 3.3*  --   --   AST 23  --   --   ALT 15  --   --   ALKPHOS 61  --   --   BILITOT 4.1*  --   --   GFRNONAA >60 >60 >60  GFRAA >60 >60 >60  ANIONGAP 11  10 8     Hematology  Recent Labs Lab 04/11/17 2229  WBC 6.8  RBC 5.02  HGB 12.7  HCT 37.8  MCV 75.3*  MCH 25.3*  MCHC 33.6  RDW 16.2*  PLT 330    Cardiac EnzymesNo results for input(s): TROPONINI in the last 168 hours. No results for input(s): TROPIPOC in the last 168 hours.   BNP  Recent Labs Lab 04/11/17 2229  BNP 722.5*     DDimer No results for input(s): DDIMER in the last 168 hours.   Radiology    No results found.  Cardiac Studies   Echocardiogram pending this admission   Transthoracic Echocardiography 06/18/2016 Study Conclusions  - Left ventricle: The cavity size was severely dilated. Wall   thickness was normal. The estimated ejection fraction was 20%.   Diffuse hypokinesis. Features are consistent with a pseudonormal   left ventricular filling pattern, with concomitant abnormal   relaxation and increased filling pressure (grade 2 diastolic   dysfunction). - Aortic valve: There was no stenosis. - Mitral valve: There was moderate to severe central regurgitation. - Left atrium: The atrium was moderately dilated. - Right  ventricle: The cavity size was normal. Systolic function   was mildly reduced. - Right atrium: The atrium was mildly dilated. - Tricuspid valve: Peak RV-RA gradient (S): 43 mm Hg. - Pulmonary arteries: PA peak pressure: 46 mm Hg (S). - Inferior vena cava: The vessel was normal in size. The   respirophasic diameter changes were in the normal range (>= 50%),   consistent with normal central venous pressure.  Impressions:  - Severely dilated LV with EF 20%, diffuse hypokinesis. Normal RV   size with mildly decreased systolic function. 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 Mild   pulmonary hypertension. Patient Profile     36 y.o. 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   Assessment & Plan    1. 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 EF 20% RV mild HK - Remains volume overloaded on exam with significant dyspnea. Could be low output. - Resume IV lasix  - Cut back carvedilol to 6.25 bid - Continue spiro and digoxin - Switch losartan to Entresto - If not diuresing well may need RHC  2.  Syncope:  - history concerning for VT.  - occasional NSVT on tele. Continue carvedilol - Keep K >4.0 Mg > 2.0 -will need EP to see for possible ICD. Doubt LifeVest will fit   3. Morbid obesity - will need weight loss   Signed, Arvilla Meres, MD  04/13/2017, 12:35 PM

## 2017-04-13 NOTE — Progress Notes (Addendum)
Pt is stable, vitals stable, no any specific complain of pain, Diuresing well, ambulating in a hallway with nurse, will continue to monitor the patient

## 2017-04-14 DIAGNOSIS — O903 Peripartum cardiomyopathy: Principal | ICD-10-CM

## 2017-04-14 DIAGNOSIS — Z6841 Body Mass Index (BMI) 40.0 and over, adult: Secondary | ICD-10-CM

## 2017-04-14 DIAGNOSIS — I5043 Acute on chronic combined systolic (congestive) and diastolic (congestive) heart failure: Secondary | ICD-10-CM

## 2017-04-14 LAB — BASIC METABOLIC PANEL
ANION GAP: 7 (ref 5–15)
BUN: 12 mg/dL (ref 6–20)
CALCIUM: 9.1 mg/dL (ref 8.9–10.3)
CO2: 31 mmol/L (ref 22–32)
CREATININE: 1.07 mg/dL — AB (ref 0.44–1.00)
Chloride: 102 mmol/L (ref 101–111)
GFR calc Af Amer: >60 mL/min (ref >60)
GLUCOSE: 107 mg/dL — AB (ref 65–99)
Potassium: 4.1 mmol/L (ref 3.5–5.1)
Sodium: 140 mmol/L (ref 135–145)

## 2017-04-14 NOTE — Consult Note (Signed)
Cardiology Consultation:   [name]; [id number]; 1980/09/29   Admit date: 04/11/2017 Date of Consult: 04/14/2017  Primary Care Provider: Patient, No Pcp Per Primary Cardiologist: Dr. Shirlee Latch Primary Electrophysiologist:  none   Patient Profile:   Kirsten Wheeler is a 36 y.o. 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  History of Present Illness:   [name] with PMHx of known NICM, morbid obesity, snoring with suspect sleep apnea though has not wanted testing, was admitted to [facility] 04/11/17 with c/o near syncopal event.  She tells me that her CM goes back to 2010, followed by MD at [facility], though probably not so compliant, states didn't quite understand the significance of her diagnosis,  After moving here she suffered a CHF exacerbation in April 2017 and since that time has been good about her medicines and follow up.  She states the day of her admission she was talking with her sister and partner, suddenly felt like she was weak, and somehow "disconnected" feeling like she may faint, she tells me her sister kept asking her if she was OK, finally after a few seconds able to say she was feeling poorly, rested for a while and after feeling better felt like she was OK and decided [place] to [facility] on the way out of the house she began again feeling weak, and required help not to fall.  She did not feel palpitations, no overt symptoms of any kind otherwise.  No CP.  She denies similar symptoms in the past.  No hx of syncope. She did not note any particular SOB but knew she was retaining water with swelling ankles.  This morning she states she feels like she go run in the park, now realizing she had been feeling poorly.  Most recent LABS K+ 4.1 BUN/Creat 12/1.07 Mag 2.2 WBC 6.8 H/H 12.7/37.8 Plts 330  Past Medical History:  Diagnosis Date  . Asthma   . Chronic systolic CHF (congestive  heart failure) (HCC)    a. ECHO 01/15/2016 EF 10-15%. 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  Marland Kitchen Hearing loss    bilateral  . Mitral regurgitation    a. severe, felt to be functional 2/2 LV dilation   . Morbid obesity (HCC)   . Snoring     Past Surgical History:  Procedure Laterality Date  . CARDIAC CATHETERIZATION N/A 01/18/2016   Procedure: Right/Left Heart Cath and Coronary Angiography;  Surgeon: Laurey Morale, MD;  Location: [facility] INVASIVE CV LAB;  Service: Cardiovascular;  Laterality: N/A;  . CESAREAN SECTION    . TONSILLECTOMY       Inpatient Medications: Scheduled Meds: . carvedilol  6.25 mg Oral BID WC  . digoxin  0.125 mg Oral Daily  . furosemide  80 mg Intravenous BID  . heparin  5,000 Units Subcutaneous Q8H  . potassium chloride SA  20 mEq Oral Daily  . sacubitril-valsartan  1 tablet Oral BID  . sodium chloride flush  3 mL Intravenous Q12H  . spironolactone  25 mg Oral Daily   Continuous Infusions: . sodium chloride     PRN Meds: sodium chloride, acetaminophen, albuterol, ondansetron (ZOFRAN) IV, sodium chloride flush  Allergies:   No Known Allergies  Social History:   Social History   Social History  . Marital status: Single    Spouse name: N/A  . Number of children: N/A  . Years of education:  N/A   Occupational History  . Not on file.   Social History Main Topics  . Smoking status: Former Smoker    Quit date: 10/19/2015  . Smokeless tobacco: Never Used  . Alcohol use 0.0 oz/week     Comment: occasionally  . Drug use: Yes    Types: Marijuana     Comment: only socially  . Sexual activity: Not on file   Other Topics Concern  . Not on file   Social History Narrative  . No narrative on file    Family History:   The patient's family history includes Diabetes in her brother; Heart attack in her mother; Hyperlipidemia in her father.  ROS:  Please see the history of present illness.  ROS  All other ROS reviewed and negative.     Physical  Exam/Data:   Vitals:   04/13/17 1300 04/13/17 2105 04/14/17 0544 04/14/17 1228  BP: 120/64 (!) 104/59 118/77 (!) 102/53  Pulse: 82 88 96 95  Resp: 18 16 17 18   Temp: 97.8 F (36.6 C) 98.6 F (37 C) 98.6 F (37 C) 97.7 F (36.5 C)  TempSrc: Oral Oral Oral Oral  SpO2: 99% 97% 96% 99%  Weight:   (!) 341 lb 14.4 oz (155.1 kg)   Height:        Intake/Output Summary (Last 24 hours) at 04/14/17 1245 Last data filed at 04/14/17 1119  Gross per 24 hour  Intake             1360 ml  Output             4800 ml  Net            -3440 ml   Filed Weights   04/11/17 2100 04/13/17 0500 04/14/17 0544  Weight: (!) 346 lb 9.6 oz (157.2 kg) (!) 345 lb (156.5 kg) (!) 341 lb 14.4 oz (155.1 kg)   Body mass index is 56.9 kg/m.  General:  Well nourished, morbidly obese, well developed, in no acute distress HEENT: normal Lymph: no adenopathy Neck: + JVD Endocrine:  No thryomegaly Vascular: No carotid bruits  Cardiac:  normal S1, S2; RRR; 1/6SM Lungs:  CTA b/l, no wheezing, rhonchi or rales  Abd: soft, nontender, obese  Ext: no edema Musculoskeletal:  No deformities, BUE and BLE strength normal and equal Skin: warm and dry  Neuro:  No gross focal abnormalities noted Psych:  Normal affect   EKG:  The EKG was personally reviewed and demonstrates:  SR 100bpm, PR , QRS , QTc Telemetry:  Telemetry was personally reviewed and demonstrates:  SR, one episodes NSVT 8beats  Relevant CV Studies:  04/13/17: TTE Study Conclusions - Left ventricle: LVEF is severely depressed a approximately 15 to   20% with hypokinesis with severe hypokinesis, septal and inferior   wall appear akinetic. . The cavity size was severely dilated.   Wall thickness was normal. - Left atrium: The atrium was moderately dilated. - Right atrium: The atrium was mildly dilated. - Pericardium, extracardiac: A small pericardial effusion was   identified.  06/18/16: TTE Impressions: - Severely dilated LV with EF  20%, diffuse hypokinesis. Normal RV   size with mildly decreased systolic function. 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 Mild   pulmonary hypertension.  01/18/16: Cardiac cath 1. No angiographic CAD, nonischemic cardiomyopathy  Laboratory Data:  Chemistry Recent Labs Lab 04/12/17 0447 04/13/17 0449 04/14/17 0534  NA 136 137 140  K 3.7 3.7 4.1  CL 99* 101 102  CO2 27 28 31   GLUCOSE 84 109* 107*  BUN 13 16 12   CREATININE 0.98 1.10* 1.07*  CALCIUM 8.9 9.2 9.1  GFRNONAA >60 >60 >60  GFRAA >60 >60 >60  ANIONGAP 10 8 7      Recent Labs Lab 04/11/17 2229  PROT 7.8  ALBUMIN 3.3*  AST 23  ALT 15  ALKPHOS 61  BILITOT 4.1*   Hematology Recent Labs Lab 04/11/17 2229  WBC 6.8  RBC 5.02  HGB 12.7  HCT 37.8  MCV 75.3*  MCH 25.3*  MCHC 33.6  RDW 16.2*  PLT 330   Cardiac EnzymesNo results for input(s): TROPONINI in the last 168 hours. No results for input(s): TROPIPOC in the last 168 hours.  BNP Recent Labs Lab 04/11/17 2229  BNP 722.5*    DDimer No results for input(s): DDIMER in the last 168 hours.  Radiology/Studies:  No results found.  Assessment and Plan:   1. Near syncope w/NICM     On medical tx, EF remains depressed     Suspect post-partum, discussion in notes, no MRI given body habitus     Discussed indication/reasoning for ICD recommendation  Given young age might consider S-ICD vs traditional ICD      She is trying to wrap her head/emotions around the idea, likely to proceed, but would like to speak with MD and device options  2. CHF exacerbation     Symptom-wise she feels "[place]" today     Down 5 lbs     Cumulatively fluid negative -8,468ml     IV diuresis continues      3. Morbid obesity     She is counseled  4. Very likely has sleep apnea     Counseled, she is willing to get sleep study done     Will f/u with Der. Mclean out patient    Signed, Sheilah Pigeon, Cordelia Poche  04/14/2017 12:45 PM  Pt  interviewed and examined  For ICD for primary prevention   The spell outlined with transient aphasia and slurred speech may have been TIA  So will benefit from Afib detecting single chamber ICD   She is still gettting IV lasix so will wait till she is closer to going home before proceeding

## 2017-04-14 NOTE — Progress Notes (Signed)
EP called stated to not let pt eat lunch Pt aware not to eat at this time  EP on way to bedside to talk to pt

## 2017-04-14 NOTE — Progress Notes (Addendum)
Chaplain introduced herself to patient.  Patient said that they offered prayer when she first came in and she is open to prayer.  Patient says they also mentioned the AD.  Patient at this time would like to wait on the AD.  Patient is not sure who she would name.  Patient states she doesn't feel that she needs it at this time.  Chaplain made patient aware of our services and that she could get it completed if she would like to while she is here. Patient says she would like prayer.  Chaplain and patient prayed together.  Patient shared lots of information with Chaplain regarding lifestyle choices and how this health event has caused her to think about life and her children.  Chaplain available should patient need additional support.    04/14/17 9935  Clinical Encounter Type  Visited With Patient  Visit Type Initial;Spiritual support;Social support  Referral From Physician  Consult/Referral To Chaplain  Spiritual Encounters  Spiritual Needs Prayer

## 2017-04-14 NOTE — Progress Notes (Signed)
Talked to EP  Pt okay to eat lunch  Pt aware

## 2017-04-14 NOTE — Progress Notes (Signed)
CARDIAC REHAB PHASE I   PRE:  Rate/Rhythm: 99 SR    BP: sitting 112/88    SaO2: 95 RA  MODE:  Ambulation: 760 ft   POST:  Rate/Rhythm: 112 ST    BP: sitting 122/89     SaO2: 96 RA  Pt moving well. Noted SOB but pt denies. HR only up to 112 ST. Apparently this is a long distance for what she has been able to do recently. Partner with her, supportive. Reminders given re: low sodium, food choices. Pt sts she has trouble with her apartment. It is leaking and also refrigerator freezes food. She has a court case.  Pt and partner request a Child psychotherapist to help with getting an apartment in [place] so that she can get to her HF appointments. Pt likes to walk. [phone]  Harriet Masson CES, ACSM 04/14/2017 2:49 PM

## 2017-04-14 NOTE — Progress Notes (Signed)
Advanced Heart Failure Rounding Note  PCP: No PCP  Primary Cardiologist: Dr. Shirlee Latch   Subjective:    Admitted 04/11/17 with acute on chronic systolic CHF. Echo with EF 20%, RV mild HK.   Resumed IV lasix yesterday, weight down 4 pounds overnight. Feels better today, has more energy. She denies SOB.    Objective:   Weight Range: (!) 341 lb 14.4 oz (155.1 kg) Body mass index is 56.9 kg/m.   Vital Signs:   Temp:  [97.8 F (36.6 C)-98.6 F (37 C)] 98.6 F (37 C) (07/30 0544) Pulse Rate:  [82-96] 96 (07/30 0544) Resp:  [16-18] 17 (07/30 0544) BP: (104-120)/(59-77) 118/77 (07/30 0544) SpO2:  [96 %-99 %] 96 % (07/30 0544) Weight:  [341 lb 14.4 oz (155.1 kg)] 341 lb 14.4 oz (155.1 kg) (07/30 0544) Last BM Date: 04/10/17  Weight change: Filed Weights   04/11/17 2100 04/13/17 0500 04/14/17 0544  Weight: (!) 346 lb 9.6 oz (157.2 kg) (!) 345 lb (156.5 kg) (!) 341 lb 14.4 oz (155.1 kg)    Intake/Output:   Intake/Output Summary (Last 24 hours) at 04/14/17 0851 Last data filed at 04/14/17 0543  Gross per 24 hour  Intake             1360 ml  Output             3600 ml  Net            -2240 ml      Physical Exam    General: Well appearing female, NAD.  HEENT: Normal Neck: Supple. JVP 8 cm.  . Carotids 2+ bilat; no bruits. No lymphadenopathy or thyromegaly appreciated. Cor: PMI nondisplaced. Regular rate & rhythm. No rubs, gallops or murmurs. Lungs: Clear but with some scattered expiratory wheezing.  Abdomen: Obese, Soft, nontender, nondistended. No hepatosplenomegaly. No bruits or masses. Good bowel sounds. Extremities: No cyanosis, clubbing, rash. 1+ pedal edema  Neuro: Alert & orientedx3, cranial nerves grossly intact. moves all 4 extremities w/o difficulty. Affect pleasant   Telemetry   NSR - personally reviewed.   EKG    Sinus tach. - Personally reviewed.   Labs    CBC  Recent Labs  04/11/17 2229  WBC 6.8  NEUTROABS 4.8  HGB 12.7  HCT 37.8  MCV  75.3*  PLT 330   Basic Metabolic Panel  Recent Labs  04/12/17 0923 04/13/17 0449 04/14/17 0534  NA  --  137 140  K  --  3.7 4.1  CL  --  101 102  CO2  --  28 31  GLUCOSE  --  109* 107*  BUN  --  16 12  CREATININE  --  1.10* 1.07*  CALCIUM  --  9.2 9.1  MG 2.2  --   --    Liver Function Tests  Recent Labs  04/11/17 2229  AST 23  ALT 15  ALKPHOS 61  BILITOT 4.1*  PROT 7.8  ALBUMIN 3.3*    BNP: BNP (last 3 results)  Recent Labs  09/23/16 1450 04/11/17 2229  BNP 194.0* 722.5*      Imaging  Transthoracic Echocardiography 04/13/17 Study Conclusions  - Left ventricle: LVEF is severely depressed a approximately 15 to   20% with hypokinesis with severe hypokinesis, septal and inferior   wall appear akinetic. . The cavity size was severely dilated.   Wall thickness was normal. - Left atrium: The atrium was moderately dilated. - Right atrium: The atrium was mildly dilated. - Pericardium, extracardiac:  A small pericardial effusion was   identified.    Medications:     Scheduled Medications: . carvedilol  6.25 mg Oral BID WC  . digoxin  0.125 mg Oral Daily  . furosemide  80 mg Intravenous BID  . heparin  5,000 Units Subcutaneous Q8H  . potassium chloride SA  20 mEq Oral Daily  . sacubitril-valsartan  1 tablet Oral BID  . sodium chloride flush  3 mL Intravenous Q12H  . spironolactone  25 mg Oral Daily     Infusions: . 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  Assessment/Plan   1. Acute on chronic systolic HF due to presumed postpartum cardiomyopathy:  - NYHA III - Volume status had to assess, but likely remains volume overloaded. Will continue IV diuresis today, 80 mg IV lasix BID.  - Continue Coreg 6.25 mg BID -  Continue Spiro 25 mg daily - Continue Entresto 24/26mg  BID, she started this yesterday.  - Can consider adding digoxin.   2.  Syncope versus severe presyncope:  - history concerning for VT.  - occasional NSVT on tele. Continue carvedilol - Keep K >4.0 Mg > 2.0 -will need EP to see for possible ICD. Doubt LifeVest will fit  - No change.   3. Morbid obesity - Encouraged weight loss.   Length of Stay: 3  Little Ishikawa, NP  04/14/2017, 8:51 AM  Advanced Heart Failure Team Pager [phone] (M-F; 7a - 4p)  Please contact [facility] for night-coverage after hours (4p -7a ) and weekends on [email or web address]  Patient seen with NP, agree with the above note.    Patient presented with presyncopal episode and acute on chronic systolic CHF.  She is diuresing well, weight down and tolerating Entresto which was started yesterday.  - Continue Lasix 80 mg IV bid today.   She has had a long-standing nonischemic CMP, EF remains low on echo this admission.  She had a presyncopal episode concerning for VT and since she came into the hospital, NSVT was noted (though not overnight last night).  I would favor ICD placement this admission (narrow QRS, no CRT).  Given size, Lifevest would be difficult.  [employer] with Heart[employer] would be helpful as her exam can be difficult for volume status.   Marca Ancona 04/14/2017 11:15 AM

## 2017-04-15 ENCOUNTER — Encounter (HOSPITAL_COMMUNITY): Payer: Self-pay

## 2017-04-15 DIAGNOSIS — I5043 Acute on chronic combined systolic (congestive) and diastolic (congestive) heart failure: Secondary | ICD-10-CM

## 2017-04-15 LAB — BASIC METABOLIC PANEL
Anion gap: 8 (ref 5–15)
BUN: 11 mg/dL (ref 6–20)
CALCIUM: 9.1 mg/dL (ref 8.9–10.3)
CHLORIDE: 99 mmol/L — AB (ref 101–111)
CO2: 28 mmol/L (ref 22–32)
CREATININE: 0.96 mg/dL (ref 0.44–1.00)
GFR calc non Af Amer: >60 mL/min (ref >60)
GLUCOSE: 101 mg/dL — AB (ref 65–99)
Potassium: 3.7 mmol/L (ref 3.5–5.1)
Sodium: 135 mmol/L (ref 135–145)

## 2017-04-15 MED ORDER — CARVEDILOL 6.25 MG PO TABS
6.2500 mg | ORAL_TABLET | Freq: Two times a day (BID) | ORAL | Status: DC
  Administered 2017-04-15 – 2017-04-22 (×13): 6.25 mg via ORAL
  Filled 2017-04-15 (×15): qty 1

## 2017-04-15 MED ORDER — POTASSIUM CHLORIDE CRYS ER 20 MEQ PO TBCR
40.0000 meq | EXTENDED_RELEASE_TABLET | Freq: Every day | ORAL | Status: DC
  Administered 2017-04-15 – 2017-04-22 (×8): 40 meq via ORAL
  Filled 2017-04-15 (×7): qty 2

## 2017-04-15 NOTE — Progress Notes (Signed)
Progress Note  [name] Date of Encounter: 04/15/2017  Primary Cardiologist: Dr. Shirlee Latch  Subjective   Feels well, no CP, SOB, no recurrent near syncope or syncope  Inpatient Medications    Scheduled Meds: . carvedilol  6.25 mg Oral BID WC  . digoxin  0.125 mg Oral Daily  . furosemide  80 mg Intravenous BID  . heparin  5,000 Units Subcutaneous Q8H  . potassium chloride SA  40 mEq Oral Daily  . sacubitril-valsartan  1 tablet Oral BID  . sodium chloride flush  3 mL Intravenous Q12H  . spironolactone  25 mg Oral Daily   Continuous Infusions: . sodium chloride     PRN Meds: sodium chloride, acetaminophen, albuterol, ondansetron (ZOFRAN) IV, sodium chloride flush   Vital Signs    Vitals:   04/14/17 1228 04/14/17 2000 04/15/17 0547 04/15/17 1224  BP: (!) 102/53 120/63 104/61 (!) 109/92  Pulse: 95 94 68 97  Resp: 18 16 18 18   Temp: 97.7 F (36.5 C) 98.3 F (36.8 C) 98.3 F (36.8 C) 98 F (36.7 C)  TempSrc: Oral Oral Oral Oral  SpO2: 99% 99% 99% 98%  Weight:   (!) 339 lb 12.8 oz (154.1 kg)   Height:        Intake/Output Summary (Last 24 hours) at 04/15/17 1303 Last data filed at 04/15/17 1225  Gross per 24 hour  Intake              620 ml  Output             3200 ml  Net            -2580 ml   Filed Weights   04/13/17 0500 04/14/17 0544 04/15/17 0547  Weight: (!) 345 lb (156.5 kg) (!) 341 lb 14.4 oz (155.1 kg) (!) 339 lb 12.8 oz (154.1 kg)    Telemetry    SR only - Personally Reviewed  ECG    No new EKG - Personally Reviewed  Physical Exam   GEN: No acute distress.   Neck: No JVD Cardiac: RRR, no murmurs, rubs, or gallops.  Respiratory: Clear to auscultation bilaterally. GI: Soft, nontender, non-distended, obese MS: No edema; No deformity. Neuro:  Nonfocal  Psych: Normal affect   Labs    Chemistry Recent Labs Lab 04/11/17 2229  04/13/17 0449 04/14/17 0534 04/15/17 0352  NA 137  < > 137 140 135  K 3.7  < > 3.7 4.1 3.7    CL 99*  < > 101 102 99*  CO2 27  < > 28 31 28   GLUCOSE 112*  < > 109* 107* 101*  BUN 11  < > 16 12 11   CREATININE 0.99  < > 1.10* 1.07* 0.96  CALCIUM 9.3  < > 9.2 9.1 9.1  PROT 7.8  --   --   --   --   ALBUMIN 3.3*  --   --   --   --   AST 23  --   --   --   --   ALT 15  --   --   --   --   ALKPHOS 61  --   --   --   --   BILITOT 4.1*  --   --   --   --   GFRNONAA >60  < > >60 >60 >60  GFRAA >60  < > >60 >60 >60  ANIONGAP 11  < > 8 7 8   < > =  values in this interval not displayed.   Hematology Recent Labs Lab 04/11/17 2229  WBC 6.8  RBC 5.02  HGB 12.7  HCT 37.8  MCV 75.3*  MCH 25.3*  MCHC 33.6  RDW 16.2*  PLT 330       BNP Recent Labs Lab 04/11/17 2229  BNP 722.5*       Radiology    No results found.  Cardiac Studies   04/13/17: TTE Study Conclusions - Left ventricle: LVEF is severely depressed a approximately 15 to 20% with hypokinesis with severe hypokinesis, septal and inferior wall appear akinetic. . The cavity size was severely dilated. Wall thickness was normal. - Left atrium: The atrium was moderately dilated. - Right atrium: The atrium was mildly dilated. - Pericardium, extracardiac: A small pericardial effusion was identified.  06/18/16: TTE Impressions: - Severely dilated LV with EF 20%, diffuse hypokinesis. Normal RV size with mildly decreased systolic function. 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 Mild pulmonary hypertension.  01/18/16: Cardiac cath 1. 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 Near syncope w/NICM     On medical tx, EF remains depressed     Suspect post-partum, discussion in notes, no MRI given body habitus     Discussed indication/reasoning for ICD recommendation     Traditional ICD planned, procedure, it's  risks, benefits were discussed with the patient today by Dr. Elberta Fortis, she is agreeable to proceed, tentatively planned for Thursday  2. CHF exacerbation     she continues to feel well     Down 7 lbs     Cumulatively fluid negative -     IV diuresis continues      3. Morbid obesity     She is counseled   4. Very likely has sleep apnea     Counseled, she is willing to get sleep study done     Braeson Wheeler f/u with Der. Mclean out patient  5. Patient reported to Dr. Graciela Husbands, a period of time with this event of difficult speech     Slurred is described by her partner     Resolved spontaneously and remains resolved     ? TIA       Signed, Sheilah Pigeon, PA-C  04/15/2017, 1:03 PM    I have seen and examined this patient with Francis Dowse.  Agree with above, note added to reflect my findings.  On exam, RRR, no murmurs, lungs clear. 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 Had an episode of near syncope prior to hospitalization. He also had slurred speech per Dr. Graciela Husbands. We'll plan for ICD implantation. Risks and benefits were discussed. Risks include bleeding, tamponade, infection, pneumothorax, among others. She understands these risks and has agreed to the procedure.    [name] MD 04/15/2017 2:00 PM

## 2017-04-15 NOTE — Progress Notes (Signed)
Advanced Heart Failure Rounding Note  PCP: No PCP  Primary Cardiologist: Dr. Shirlee Latch   Subjective:    Admitted 04/11/17 with acute on chronic systolic CHF. Echo with EF 20%, RV mild HK.   Weight down 2 pounds overnight. 7 pounds down total. Lying mostly flat, dyspneic with talking. Feels more reassured about ICD placement this am.   Objective:   Weight Range: (!) 339 lb 12.8 oz (154.1 kg) Body mass index is 56.55 kg/m.   Vital Signs:   Temp:  [97.7 F (36.5 C)-98.3 F (36.8 C)] 98.3 F (36.8 C) (07/31 0547) Pulse Rate:  [68-95] 68 (07/31 0547) Resp:  [16-18] 18 (07/31 0547) BP: (102-120)/(53-63) 104/61 (07/31 0547) SpO2:  [99 %] 99 % (07/31 0547) Weight:  [339 lb 12.8 oz (154.1 kg)] 339 lb 12.8 oz (154.1 kg) (07/31 0547) Last BM Date: 04/15/17  Weight change: Filed Weights   04/13/17 0500 04/14/17 0544 04/15/17 0547  Weight: (!) 345 lb (156.5 kg) (!) 341 lb 14.4 oz (155.1 kg) (!) 339 lb 12.8 oz (154.1 kg)    Intake/Output:   Intake/Output Summary (Last 24 hours) at 04/15/17 2956 Last data filed at 04/15/17 0500  Gross per 24 hour  Intake              540 ml  Output             3100 ml  Net            -2560 ml      Physical Exam    General: Obese female. NAD.  HEENT: Normal.  Neck: Supple.JVP hard to assess, does not appear elevated. Carotids 2+ bilat; no bruits. No lymphadenopathy or thyromegaly appreciated. Cor: PMI non displaced, regular rate and rhythm. No rubs, gallops or murmurs. Lungs: Scattered expiratory wheezing. Diminished in bases.  Abdomen:Obese, soft, non tender, non distended. No hepatosplenomegaly. No bruits or masses. Good bowel sounds. Extremities: No cyanosis, clubbing, rash. Trace pedal edema bilaterally.  Neuro: Alert and oriented x 3. cranial nerves grossly intact. moves all 4 extremities w/o difficulty. Affect pleasant   Telemetry   NSR - personally reviewed  EKG    04/11/17 Sinus tach - personally reviewed.   Labs     CBC No results for input(s): WBC, NEUTROABS, HGB, HCT, MCV, PLT in the last 72 hours. Basic Metabolic Panel  Recent Labs  04/12/17 0923  04/14/17 0534 04/15/17 0352  NA  --   < > 140 135  K  --   < > 4.1 3.7  CL  --   < > 102 99*  CO2  --   < > 31 28  GLUCOSE  --   < > 107* 101*  BUN  --   < > 12 11  CREATININE  --   < > 1.07* 0.96  CALCIUM  --   < > 9.1 9.1  MG 2.2  --   --   --   < > = values in this interval not displayed. Liver Function Tests No results for input(s): AST, ALT, ALKPHOS, BILITOT, PROT, ALBUMIN in the last 72 hours.  BNP: BNP (last 3 results)  Recent Labs  09/23/16 1450 04/11/17 2229  BNP 194.0* 722.5*      Imaging  Transthoracic Echocardiography 04/13/17 Study Conclusions  - Left ventricle: LVEF is severely depressed a approximately 15 to   20% with hypokinesis with severe hypokinesis, septal and inferior   wall appear akinetic. . The cavity size was severely dilated.  Wall thickness was normal. - Left atrium: The atrium was moderately dilated. - Right atrium: The atrium was mildly dilated. - Pericardium, extracardiac: A small pericardial effusion was   identified.    Medications:     Scheduled Medications: . carvedilol  6.25 mg Oral BID WC  . digoxin  0.125 mg Oral Daily  . furosemide  80 mg Intravenous BID  . heparin  5,000 Units Subcutaneous Q8H  . potassium chloride SA  20 mEq Oral Daily  . sacubitril-valsartan  1 tablet Oral BID  . sodium chloride flush  3 mL Intravenous Q12H  . spironolactone  25 mg Oral Daily    Infusions: . 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  Assessment/Plan   1. 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  - Increase K to today - Continue Coreg 6.25 mg BID - Continue Spiro 25 mg daily - Continue Entresto 24/26 mg BID, BP too soft to increase.  - Continue dig 0.125 mcg.   2.  Syncope versus severe presyncope:  - history concerning for VT.  - occasional NSVT on tele. Continue carvedilol - Keep K >4.0 Mg > 2.0 - For ICD placement this admission.   3. Morbid obesity - Encouraged weight loss.  - No change  4. OSA - Needs repeat sleep study outpatient.   Length of Stay: 4  Little Ishikawa, NP  04/15/2017, 8:21 AM  Advanced Heart Failure Team Pager [phone] (M-F; 7a - 4p)  Please contact [facility] for night-coverage after hours (4p -7a ) and weekends on [email or web address]  Patient seen with NP, agree with the above note.  She diuresed well yesterday.  Still some volume overload on exam.  Will have her continue IV Lasix today, likely back to po Lasix tomorrow.  ICD placement this admission per EP.   Marca Ancona 04/15/2017 3:39 PM

## 2017-04-15 NOTE — Progress Notes (Signed)
CARDIAC REHAB PHASE I   PRE:  Rate/Rhythm: 83 SR    BP: sitting 105/77    SaO2: 96 RA  MODE:  Ambulation: 1400 ft   POST:  Rate/Rhythm: 106 ST    BP: sitting 105/90     SaO2: 94 RA  Pt feeling weak after lasix this am, asleep on my arrival. Able to wake and walk a long distance with slow pace. SOB but sts she feels ok. Sts she enjoyed the walk. Return to EOB. Encouraged more walking. [phone]   Harriet Masson CES, ACSM 04/15/2017 12:24 PM

## 2017-04-15 NOTE — Progress Notes (Signed)
I cosign Jennifer Stephens, RN's assessment, care plan, education, medication administration, and intake/output. 

## 2017-04-15 NOTE — Plan of Care (Signed)
Problem: Activity: Goal: Risk for activity intolerance will decrease Outcome: Progressing Patient ambulates in room.   Declined heparin injection.  Problem: Cardiac: Goal: Ability to achieve and maintain adequate cardiopulmonary perfusion will improve Outcome: Progressing As of 04/13/17-  EF was 15%-20%.  Producing urine, daily weights being monitored

## 2017-04-16 ENCOUNTER — Encounter (HOSPITAL_COMMUNITY): Payer: Self-pay | Admitting: *Deleted

## 2017-04-16 LAB — BASIC METABOLIC PANEL
ANION GAP: 8 (ref 5–15)
BUN: 12 mg/dL (ref 6–20)
CHLORIDE: 98 mmol/L — AB (ref 101–111)
CO2: 31 mmol/L (ref 22–32)
Calcium: 9.3 mg/dL (ref 8.9–10.3)
Creatinine, Ser: 0.97 mg/dL (ref 0.44–1.00)
GFR calc Af Amer: >60 mL/min (ref >60)
Glucose, Bld: 105 mg/dL — ABNORMAL HIGH (ref 65–99)
POTASSIUM: 3.7 mmol/L (ref 3.5–5.1)
SODIUM: 137 mmol/L (ref 135–145)

## 2017-04-16 LAB — MAGNESIUM: MAGNESIUM: 1.8 mg/dL (ref 1.7–2.4)

## 2017-04-16 MED ORDER — DEXTROSE 5 % IV SOLN
3.0000 g | INTRAVENOUS | Status: AC
  Administered 2017-04-17: 3 g via INTRAVENOUS
  Filled 2017-04-16 (×2): qty 3000

## 2017-04-16 MED ORDER — SODIUM CHLORIDE 0.9% FLUSH: 3.0000 mL | INTRAVENOUS | Status: DC | PRN

## 2017-04-16 MED ORDER — CHLORHEXIDINE GLUCONATE 4 % EX LIQD
60.0000 mL | Freq: Once | CUTANEOUS | Status: DC
  Filled 2017-04-16: qty 60

## 2017-04-16 MED ORDER — SODIUM CHLORIDE 0.9 % IV SOLN: INTRAVENOUS | Status: DC

## 2017-04-16 MED ORDER — TORSEMIDE 20 MG PO TABS: 60.0000 mg | ORAL_TABLET | Freq: Every day | ORAL | Status: DC

## 2017-04-16 MED ORDER — SODIUM CHLORIDE 0.9% FLUSH
3.0000 mL | Freq: Two times a day (BID) | INTRAVENOUS | Status: DC
  Administered 2017-04-17: 3 mL via INTRAVENOUS

## 2017-04-16 MED ORDER — SODIUM CHLORIDE 0.9 % IV SOLN: 250.0000 mL | INTRAVENOUS | Status: DC

## 2017-04-16 MED ORDER — TORSEMIDE 20 MG PO TABS
60.0000 mg | ORAL_TABLET | Freq: Every day | ORAL | Status: DC
  Administered 2017-04-17 – 2017-04-20 (×4): 60 mg via ORAL
  Filled 2017-04-16 (×4): qty 3

## 2017-04-16 MED ORDER — GENTAMICIN SULFATE 40 MG/ML IJ SOLN
80.0000 mg | INTRAMUSCULAR | Status: AC
  Administered 2017-04-17: 80 mg
  Filled 2017-04-16 (×2): qty 2

## 2017-04-16 NOTE — Progress Notes (Signed)
Advanced Heart Failure Rounding Note  PCP: No PCP  Primary Cardiologist: Dr. Shirlee Latch   Subjective:    Admitted 04/11/17 with acute on chronic systolic CHF. Echo with EF 20%, RV mild HK.   Weight up 3 pounds overnight? She feels great, walked through the halls yesterday briskly without SOB.   Denies orthopnea, SOB, palpitations.   Objective:   Weight Range: (!) 342 lb (155.1 kg) Body mass index is 56.91 kg/m.   Vital Signs:   Temp:  [98 F (36.7 C)-98.2 F (36.8 C)] 98.2 F (36.8 C) (08/01 0415) Pulse Rate:  [92-104] 92 (08/01 0415) Resp:  [18] 18 (08/01 0415) BP: (108-114)/(62-92) 114/62 (08/01 0415) SpO2:  [98 %-100 %] 100 % (08/01 0415) Weight:  [342 lb (155.1 kg)] 342 lb (155.1 kg) (08/01 0415) Last BM Date: 04/14/17  Weight change: Filed Weights   04/14/17 0544 04/15/17 0547 04/16/17 0415  Weight: (!) 341 lb 14.4 oz (155.1 kg) (!) 339 lb 12.8 oz (154.1 kg) (!) 342 lb (155.1 kg)    Intake/Output:   Intake/Output Summary (Last 24 hours) at 04/16/17 0827 Last data filed at 04/16/17 0200  Gross per 24 hour  Intake             1315 ml  Output             3200 ml  Net            -1885 ml      Physical Exam    General:Obese female, NAD.  HEENT: Normal Neck: Supple. Hard to assess JVP. Carotids 2+ bilat; no bruits. No thyromegaly or nodule noted. Cor: PMI nondisplaced. Regular rate and rhythm.  Lungs: Clear bilaterally. Normal effort.  Abdomen: Soft, non-tender, non-distended, no HSM. No bruits or masses. +BS  Extremities: No cyanosis, clubbing, rash, R and LLE no edema.  Neuro: Alert & orientedx3, cranial nerves grossly intact. moves all 4 extremities w/o difficulty. Affect pleasant   Telemetry   NSR - personally reviewed.   EKG    04/11/17 Sinus tach - personally reviewed.   Labs    CBC No results for input(s): WBC, NEUTROABS, HGB, HCT, MCV, PLT in the last 72 hours. Basic Metabolic Panel  Recent Labs  04/15/17 0352 04/16/17 0252  NA  135 137  K 3.7 3.7  CL 99* 98*  CO2 28 31  GLUCOSE 101* 105*  BUN 11 12  CREATININE 0.96 0.97  CALCIUM 9.1 9.3   Liver Function Tests No results for input(s): AST, ALT, ALKPHOS, BILITOT, PROT, ALBUMIN in the last 72 hours.  BNP: BNP (last 3 results)  Recent Labs  09/23/16 1450 04/11/17 2229  BNP 194.0* 722.5*      Imaging  Transthoracic Echocardiography 04/13/17 Study Conclusions  - Left ventricle: LVEF is severely depressed a approximately 15 to   20% with hypokinesis with severe hypokinesis, septal and inferior   wall appear akinetic. . The cavity size was severely dilated.   Wall thickness was normal. - Left atrium: The atrium was moderately dilated. - Right atrium: The atrium was mildly dilated. - Pericardium, extracardiac: A small pericardial effusion was   identified.    Medications:     Scheduled Medications: . carvedilol  6.25 mg Oral BID WC  . digoxin  0.125 mg Oral Daily  . furosemide  80 mg Intravenous BID  . heparin  5,000 Units Subcutaneous Q8H  . potassium chloride SA  40 mEq Oral Daily  . sacubitril-valsartan  1 tablet Oral BID  .  sodium chloride flush  3 mL Intravenous Q12H  . spironolactone  25 mg Oral Daily    Infusions: . 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  Assessment/Plan   1. 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  - Continue KCl 40 mEq daily.  - Continue Coreg 6.25 mg BID - Continue Spiro 25 mg daily - Continue Entresto 24/26 mg BID, BP too soft to increase.  - Continue dig 0.125 mcg.   2.  Syncope versus severe presyncope:  - history concerning for VT.  - Keep K >4.0 Mg > 2.0 - Check Mg today.  -  For ICD tomorrow.   3. Morbid obesity - Encouraged weight loss.  - No change.   4. OSA - Needs repeat sleep study outpatient.  - No change.   Length of Stay: 5  Little Ishikawa, NP  04/16/2017, 8:27 AM  Advanced Heart Failure Team Pager [phone] (M-F; 7a - 4p)  Please contact [facility] for night-coverage after hours (4p -7a ) and weekends on [email or web address]  Patient seen with NP, agree with the above note.  Weight is difficult to interpret but she has had a lot of fluid out.  Volume looks better, can transition to po.  Would use torsemide 60 mg daily and follow response (she was taking up to 160 mg Lasix once a day prior to admission).   Marca Ancona 04/16/2017 1:46 PM

## 2017-04-16 NOTE — Progress Notes (Signed)
CARDIAC REHAB PHASE I   PRE:  Rate/Rhythm: 92 SR  BP:  Sitting: 102/64        SaO2: 100 RA  MODE:  Ambulation: 1840 ft   POST:  Rate/Rhythm: 99 SR  BP:  Sitting: 132/96         SaO2: 98 RA  Pt ambulated 1840 ft on RA, independent, steady gait, tolerated well. Pt c/o DOE, leg numbness with distance, denies any other complaints, declined rest stop. Pt with complex social situation, may benefit from [facility] for chf disease management, SW. Case manager following. Pt to bed per pt request after walk, call bell within reach. Will follow.   [phone] Joylene Grapes, RN, BSN 04/16/2017 3:00 PM

## 2017-04-16 NOTE — Care Management Note (Signed)
Case Management Note  Patient Details  Name: Kirsten Wheeler MRN: [id number] Date of Birth: March 11, 1981  Subjective/Objective:       Peripartum Cardiomyopathy           Action/Plan: Patient lives at home with significant other; she goes to [facility] for primary care; has private insurance with Medicaid with prescription drug coverage; pharmacy of choice is [facility]; Patient could benefit from a Disease Management Program for CHF; HHC choice offered, pt chose [facility]); Mary with [facility] called for arrangements; also patient wants a personal care service / nurses aide; patient is to contact her PCP for completion of paperwork for this service; she has scales at home and knows to weigh herself daily and cooks a heart healthy diet low in sodium. CM will continue to follow for DCP.  Expected Discharge Date:  Possible 04/17/2017           Expected Discharge Plan:  Home w Home Health Services  Discharge planning Services  CM Consult  Choice offered to:  Patient  HH Arranged:  RN, Social Work HH Agency:  [facility] (now [facility])  Status of Service:  In process, will continue to follow  Reola Mosher [phone] 04/16/2017, 5:03 PM

## 2017-04-17 ENCOUNTER — Encounter (HOSPITAL_COMMUNITY)
Admission: AD | Disposition: A | Discharge status: Home Health | Payer: Self-pay | Source: Other Acute Inpatient Hospital | Attending: Internal Medicine

## 2017-04-17 DIAGNOSIS — I429 Cardiomyopathy, unspecified: Secondary | ICD-10-CM

## 2017-04-17 HISTORY — PX: ICD IMPLANT: EP1208

## 2017-04-17 LAB — HCG, SERUM, QUALITATIVE: Preg, Serum: NEGATIVE

## 2017-04-17 SURGERY — ICD IMPLANT

## 2017-04-17 MED ORDER — LIDOCAINE HCL (PF) 1 % IJ SOLN
INTRAMUSCULAR | Status: AC
  Filled 2017-04-17: qty 60

## 2017-04-17 MED ORDER — ONDANSETRON HCL 4 MG/2ML IJ SOLN: 4.0000 mg | Freq: Four times a day (QID) | INTRAMUSCULAR | Status: DC | PRN

## 2017-04-17 MED ORDER — OXYCODONE HCL 5 MG PO TABS
5.0000 mg | ORAL_TABLET | Freq: Four times a day (QID) | ORAL | Status: DC | PRN
  Administered 2017-04-17: 5 mg via ORAL
  Filled 2017-04-17: qty 1

## 2017-04-17 MED ORDER — HYDROMORPHONE HCL 1 MG/ML IJ SOLN
1.0000 mg | INTRAMUSCULAR | Status: DC | PRN
  Administered 2017-04-17 – 2017-04-18 (×3): 1 mg via INTRAVENOUS
  Filled 2017-04-17 (×3): qty 1

## 2017-04-17 MED ORDER — MIDAZOLAM HCL 5 MG/5ML IJ SOLN
INTRAMUSCULAR | Status: AC
  Filled 2017-04-17: qty 5

## 2017-04-17 MED ORDER — FENTANYL CITRATE (PF) 100 MCG/2ML IJ SOLN
INTRAMUSCULAR | Status: DC | PRN
  Administered 2017-04-17 (×2): 25 ug via INTRAVENOUS

## 2017-04-17 MED ORDER — ALPRAZOLAM 0.25 MG PO TABS
0.2500 mg | ORAL_TABLET | Freq: Two times a day (BID) | ORAL | Status: DC | PRN
  Administered 2017-04-17 – 2017-04-21 (×2): 0.25 mg via ORAL
  Filled 2017-04-17 (×2): qty 1

## 2017-04-17 MED ORDER — LIDOCAINE HCL (PF) 1 % IJ SOLN
INTRAMUSCULAR | Status: AC
  Filled 2017-04-17: qty 30

## 2017-04-17 MED ORDER — CEFAZOLIN SODIUM-DEXTROSE 1-4 GM/50ML-% IV SOLN
1.0000 g | Freq: Four times a day (QID) | INTRAVENOUS | Status: AC
  Administered 2017-04-17 – 2017-04-18 (×3): 1 g via INTRAVENOUS
  Filled 2017-04-17 (×3): qty 50

## 2017-04-17 MED ORDER — HEPARIN (PORCINE) IN NACL 2-0.9 UNIT/ML-% IJ SOLN
INTRAMUSCULAR | Status: AC
  Filled 2017-04-17: qty 1000

## 2017-04-17 MED ORDER — HEPARIN (PORCINE) IN NACL 2-0.9 UNIT/ML-% IJ SOLN
INTRAMUSCULAR | Status: AC | PRN
  Administered 2017-04-17: 500 mL

## 2017-04-17 MED ORDER — IOPAMIDOL (ISOVUE-370) INJECTION 76%
INTRAVENOUS | Status: AC
  Filled 2017-04-17: qty 50

## 2017-04-17 MED ORDER — FENTANYL CITRATE (PF) 100 MCG/2ML IJ SOLN
INTRAMUSCULAR | Status: AC
Start: 2017-04-17 — End: ?
  Filled 2017-04-17: qty 2

## 2017-04-17 MED ORDER — MAGNESIUM SULFATE 2 GM/50ML IV SOLN
2.0000 g | Freq: Once | INTRAVENOUS | Status: AC
  Administered 2017-04-17: 2 g via INTRAVENOUS
  Filled 2017-04-17: qty 50

## 2017-04-17 MED ORDER — LIDOCAINE HCL (PF) 1 % IJ SOLN
INTRAMUSCULAR | Status: DC | PRN
  Administered 2017-04-17: 45 mL via INTRADERMAL
  Administered 2017-04-17: 25 mL via INTRADERMAL

## 2017-04-17 MED ORDER — MIDAZOLAM HCL 5 MG/5ML IJ SOLN
INTRAMUSCULAR | Status: DC | PRN
  Administered 2017-04-17 (×2): 1 mg via INTRAVENOUS

## 2017-04-17 MED ORDER — ACETAMINOPHEN 325 MG PO TABS: 325.0000 mg | ORAL_TABLET | ORAL | Status: DC | PRN

## 2017-04-17 SURGICAL SUPPLY — 8 items
CABLE SURGICAL S-101-97-12 (CABLE) ×3 IMPLANT
HOVERMATT SINGLE USE (MISCELLANEOUS) ×3 IMPLANT
ICD VISIA MRI VR DVFB1D4 (ICD Generator) ×1 IMPLANT
LEAD SPRINT QUAT SEC 6935M-62 (Lead) ×3 IMPLANT
PAD DEFIB LIFELINK (PAD) ×3 IMPLANT
SHEATH CLASSIC 9F (SHEATH) ×3 IMPLANT
TRAY PACEMAKER INSERTION (PACKS) ×3 IMPLANT
VISIA MRI VR DVFB1D4 (ICD Generator) ×3 IMPLANT

## 2017-04-17 NOTE — Progress Notes (Signed)
ICD Criteria  Current LVEF:15-20%. Within 12 months prior to implant: Yes   Heart failure history: Yes, Class II  Cardiomyopathy history: Yes, Non-Ischemic Cardiomyopathy.  Atrial Fibrillation/Atrial Flutter: No.  Ventricular tachycardia history: No.  Cardiac arrest history: No.  History of syndromes with risk of sudden death: No.  Previous ICD: No.  Current ICD indication: Primary  PPM indication: No.   Class I or II Bradycardia indication present: No  Beta Blocker therapy for 3 or more months: Yes, prescribed.   Ace Inhibitor/ARB therapy for 3 or more months: Yes, prescribed.    

## 2017-04-17 NOTE — Progress Notes (Signed)
Advanced Heart Failure Rounding Note  PCP: No PCP  Primary Cardiologist: Dr. Shirlee Latch   Subjective:    Admitted 04/11/17 with acute on chronic systolic CHF. Echo with EF 20%, RV mild HK.   Weight down 7 pounds total. For ICD placement today. Feels well, denies chest pain, SOB and orthopnea.   Objective:   Weight Range: (!) 339 lb 11.2 oz (154.1 kg) Body mass index is 56.53 kg/m.   Vital Signs:   Temp:  [98.6 F (37 C)-98.7 F (37.1 C)] 98.6 F (37 C) (08/02 0529) Pulse Rate:  [90-100] 100 (08/02 0529) Resp:  [18-19] 19 (08/02 0529) BP: (108-123)/(66-80) 118/66 (08/02 0529) SpO2:  [97 %-100 %] 100 % (08/02 0529) Weight:  [339 lb 11.2 oz (154.1 kg)] 339 lb 11.2 oz (154.1 kg) (08/02 0529) Last BM Date: 05/15/17  Weight change: Filed Weights   04/15/17 0547 04/16/17 0415 04/17/17 0529  Weight: (!) 339 lb 12.8 oz (154.1 kg) (!) 342 lb (155.1 kg) (!) 339 lb 11.2 oz (154.1 kg)    Intake/Output:   Intake/Output Summary (Last 24 hours) at 04/17/17 0851 Last data filed at 04/17/17 0800  Gross per 24 hour  Intake              600 ml  Output                0 ml  Net              600 ml      Physical Exam    General:Obese female, NAD.  HEENT: Normal Neck: Supple. Hard to assess JVP with body habitus.  Carotids 2+ bilat; no bruits. No thyromegaly or nodule noted. Cor: PMI nondisplaced.Regular rate and rhythm. No M/R/G's.  Lungs: Clear bilaterally. Normal effort.  Abdomen:Obese, soft, non tender, non distended. , no HSM. No bruits or masses. +BS  Extremities: No cyanosis, clubbing, rash,No peripheral edema.  Neuro: Alert & orientedx3, cranial nerves grossly intact. moves all 4 extremities w/o difficulty. Affect pleasant   Telemetry   NSR - personally reviewed.   EKG    04/11/17 - Sinus tach - personally reviewed.   Labs    CBC No results for input(s): WBC, NEUTROABS, HGB, HCT, MCV, PLT in the last 72 hours. Basic Metabolic Panel  Recent Labs   83/25/49 0352 04/16/17 0252 04/16/17 1006  NA 135 137  --   K 3.7 3.7  --   CL 99* 98*  --   CO2 28 31  --   GLUCOSE 101* 105*  --   BUN 11 12  --   CREATININE 0.96 0.97  --   CALCIUM 9.1 9.3  --   MG  --   --  1.8   Liver Function Tests No results for input(s): AST, ALT, ALKPHOS, BILITOT, PROT, ALBUMIN in the last 72 hours.  BNP: BNP (last 3 results)  Recent Labs  09/23/16 1450 04/11/17 2229  BNP 194.0* 722.5*      Imaging  Transthoracic Echocardiography 04/13/17 Study Conclusions  - Left ventricle: LVEF is severely depressed a approximately 15 to   20% with hypokinesis with severe hypokinesis, septal and inferior   wall appear akinetic. . The cavity size was severely dilated.   Wall thickness was normal. - Left atrium: The atrium was moderately dilated. - Right atrium: The atrium was mildly dilated. - Pericardium, extracardiac: A small pericardial effusion was   identified.    Medications:     Scheduled Medications: . carvedilol  6.25 mg Oral BID WC  . chlorhexidine  60 mL Topical Once  . chlorhexidine  60 mL Topical Once  . digoxin  0.125 mg Oral Daily  . gentamicin irrigation  80 mg Irrigation To OR  . potassium chloride SA  40 mEq Oral Daily  . sacubitril-valsartan  1 tablet Oral BID  . sodium chloride flush  3 mL Intravenous Q12H  . sodium chloride flush  3 mL Intravenous Q12H  . spironolactone  25 mg Oral Daily  . torsemide  60 mg Oral Daily    Infusions: . sodium chloride    . sodium chloride    . sodium chloride    .  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  Assessment/Plan   1. 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  Continue torsemide 60 mg daily.  - Continue KCl 40 mEq daily.  - Continue Coreg 6.25 mg BID - Continue Spiro 25 mg daily - Continue Entresto 24/26 mg BID, BP too soft to increase.  - Continue dig 0.125 mcg.  - BP too soft to titrate up Entesto, may be able to do outpatient.   2.  Syncope versus severe presyncope:  - history concerning for VT.  - Keep K >4.0 Mg > 2.0 - Mg 1.8. Will give 2 gm.  - ICD placement today with Dr. Elberta Fortis.   3. Morbid obesity - Encouraged weight loss.  - Body mass index is 56.53 kg/m.  4. OSA - Needs repeat sleep study outpatient.  - No change.   Length of Stay: 6  Little Ishikawa, NP  04/17/2017, 8:51 AM  Advanced Heart Failure Team Pager [phone] (M-F; 7a - 4p)  Please contact [facility] for night-coverage after hours (4p -7a ) and weekends on [email or web address]  Patient seen with NP, agree with the above note.  She will go for ICD today.  Device will have atrial arrhythmia detection so we can follow for afib (?episode of aphasia).   She is stable, now on po diuretic.  BP soft at times, would hold off on further med titration for now.   Probably home tomorrow.    Marca Ancona 04/17/2017

## 2017-04-17 NOTE — Progress Notes (Signed)
Pt scheduled for ICD placement on 04/17/17 at 13:30. Pt refusing Hibiclens bath tonight as she has already showered. Pt agrees to take the 1st Hibiclens shower early this AM and the 2nd Hibiclens shower in the late morning. Pt also agrees to have 2nd peripheral IV placed after 1st shower this AM. Will continue to monitor.

## 2017-04-17 NOTE — Progress Notes (Signed)
Progress Note  [name] Date of Encounter: 04/17/2017  Primary Cardiologist: Dr. Shirlee Latch  Subjective   Feels well, no CP, SOB, no recurrent near syncope or syncope, a little nervous about her procedure today but still wants to proceed, and more comfortable in general with the idea of the device placement  Inpatient Medications    Scheduled Meds: . carvedilol  6.25 mg Oral BID WC  . chlorhexidine  60 mL Topical Once  . chlorhexidine  60 mL Topical Once  . digoxin  0.125 mg Oral Daily  . gentamicin irrigation  80 mg Irrigation To OR  . potassium chloride SA  40 mEq Oral Daily  . sacubitril-valsartan  1 tablet Oral BID  . sodium chloride flush  3 mL Intravenous Q12H  . sodium chloride flush  3 mL Intravenous Q12H  . spironolactone  25 mg Oral Daily  . torsemide  60 mg Oral Daily   Continuous Infusions: . sodium chloride    . sodium chloride    . sodium chloride    .  ceFAZolin (ANCEF) IV    . magnesium sulfate 1 - 4 g bolus IVPB     PRN Meds: sodium chloride, acetaminophen, albuterol, ondansetron (ZOFRAN) IV, sodium chloride flush, sodium chloride flush   Vital Signs    Vitals:   04/16/17 0415 04/16/17 1231 04/16/17 1949 04/17/17 0529  BP: 114/62 108/73 123/80 118/66  Pulse: 92 97 90 100  Resp: 18  18 19   Temp: 98.2 F (36.8 C)  98.7 F (37.1 C) 98.6 F (37 C)  TempSrc: Oral  Oral Oral  SpO2: 100% 97% 98% 100%  Weight: (!) 342 lb (155.1 kg)   (!) 339 lb 11.2 oz (154.1 kg)  Height:        Intake/Output Summary (Last 24 hours) at 04/17/17 0905 Last data filed at 04/17/17 0800  Gross per 24 hour  Intake              600 ml  Output                0 ml  Net              600 ml   Filed Weights   04/15/17 0547 04/16/17 0415 04/17/17 0529  Weight: (!) 339 lb 12.8 oz (154.1 kg) (!) 342 lb (155.1 kg) (!) 339 lb 11.2 oz (154.1 kg)    Telemetry    SR only - Personally Reviewed  ECG    No new EKG - Personally Reviewed  Physical Exam    GEN: No acute distress.   Neck: No JVD Cardiac: RRR, no murmurs, rubs, or gallops.  Respiratory: Clear to auscultation bilaterally. GI: Soft, nontender, non-distended, obese MS: No edema; No deformity. Neuro:  Nonfocal  Psych: Normal affect   Labs    Chemistry Recent Labs Lab 04/11/17 2229  04/14/17 0534 04/15/17 0352 04/16/17 0252  NA 137  < > 140 135 137  K 3.7  < > 4.1 3.7 3.7  CL 99*  < > 102 99* 98*  CO2 27  < > 31 28 31   GLUCOSE 112*  < > 107* 101* 105*  BUN 11  < > 12 11 12   CREATININE 0.99  < > 1.07* 0.96 0.97  CALCIUM 9.3  < > 9.1 9.1 9.3  PROT 7.8  --   --   --   --   ALBUMIN 3.3*  --   --   --   --  AST 23  --   --   --   --   ALT 15  --   --   --   --   ALKPHOS 61  --   --   --   --   BILITOT 4.1*  --   --   --   --   GFRNONAA >60  < > >60 >60 >60  GFRAA >60  < > >60 >60 >60  ANIONGAP 11  < > 7 8 8   < > = values in this interval not displayed.   Hematology  Recent Labs Lab 04/11/17 2229  WBC 6.8  RBC 5.02  HGB 12.7  HCT 37.8  MCV 75.3*  MCH 25.3*  MCHC 33.6  RDW 16.2*  PLT 330       BNP  Recent Labs Lab 04/11/17 2229  BNP 722.5*       Radiology    No results found.  Cardiac Studies   04/13/17: TTE Study Conclusions - Left ventricle: LVEF is severely depressed a approximately 15 to 20% with hypokinesis with severe hypokinesis, septal and inferior wall appear akinetic. . The cavity size was severely dilated. Wall thickness was normal. - Left atrium: The atrium was moderately dilated. - Right atrium: The atrium was mildly dilated. - Pericardium, extracardiac: A small pericardial effusion was identified.  06/18/16: TTE Impressions: - Severely dilated LV with EF 20%, diffuse hypokinesis. Normal RV size with mildly decreased systolic function. 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 Mild pulmonary hypertension.  01/18/16: Cardiac cath 1. 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 Near syncope w/NICM     On medical tx, EF remains depressed     Suspect post-partum, discussion in notes, no MRI given body habitus     Discussed indication/reasoning for ICD recommendation     Traditional ICD planned, procedure, it's risks, benefits have discussed with the patient by Dr. Elberta Wheeler, she is agreeable to proceed, planned for today, she has no f/u questions and remains agreeable to proceed  2. CHF exacerbation     she continues to feel well     Down 7 lbs (unchanged)     Cumulatively fluid negative - (similar to yesterday)     changed to PO diuretic yesterday         3. Morbid obesity     She has been counseled and seems motiveated   4. Very likely has sleep apnea     Counseled, she is willing to get sleep study done     Kirsten Wheeler f/u with Kirsten Wheeler out patient  5. Patient reported to Dr. Graciela Wheeler, a period of time with this event of difficult speech     Slurred is described by her partner     Resolved spontaneously and remains resolved     ? TIA       Signed, Kirsten Pigeon, PA-C  04/17/2017, 9:05 AM    I have seen and examined this patient with Kirsten Wheeler.  Agree with above, note added to reflect my findings.  On exam, RRR, no murmurs, lungs clear. Had near syncope and NICM. Plan for ICD today. Risks and benefits discussed. Risks include but not limited to bleeding, infection, tamponade, pneumothorax. She understands the risks and has agreed to the procedure. She had aphasia and Kirsten Wheeler therefore place a  device with atrial arrhythmia detection.    Kirsten Wheeler M. Kirsten Stamper MD 04/17/2017 9:12 AM

## 2017-04-17 NOTE — Progress Notes (Signed)
Orthopedic Tech Progress Note Patient Details:  Kirsten Wheeler May 14, 1981 [id number] Replacement sling Ortho Devices Type of Ortho Device: Arm sling Ortho Device/Splint Location: LUE Ortho Device/Splint Interventions: Ordered, Application   Jennye Moccasin 04/17/2017, 5:55 PM

## 2017-04-18 ENCOUNTER — Encounter (HOSPITAL_COMMUNITY): Payer: Self-pay | Admitting: Cardiology

## 2017-04-18 ENCOUNTER — Inpatient Hospital Stay (HOSPITAL_COMMUNITY): Payer: Medicaid Other

## 2017-04-18 LAB — BASIC METABOLIC PANEL
Anion gap: 11 (ref 5–15)
BUN: 19 mg/dL (ref 6–20)
CALCIUM: 9.4 mg/dL (ref 8.9–10.3)
CO2: 27 mmol/L (ref 22–32)
CREATININE: 0.96 mg/dL (ref 0.44–1.00)
Chloride: 98 mmol/L — ABNORMAL LOW (ref 101–111)
GFR calc Af Amer: >60 mL/min (ref >60)
Glucose, Bld: 220 mg/dL — ABNORMAL HIGH (ref 65–99)
Potassium: 4.2 mmol/L (ref 3.5–5.1)
SODIUM: 136 mmol/L (ref 135–145)

## 2017-04-18 LAB — CBC
HCT: 43.5 % (ref 36.0–46.0)
Hemoglobin: 14.8 g/dL (ref 12.0–15.0)
MCH: 25.6 pg — ABNORMAL LOW (ref 26.0–34.0)
MCHC: 34 g/dL (ref 30.0–36.0)
MCV: 75.4 fL — ABNORMAL LOW (ref 78.0–100.0)
PLATELETS: 279 10*3/uL (ref 150–400)
RBC: 5.77 MIL/uL — ABNORMAL HIGH (ref 3.87–5.11)
RDW: 16.1 % — AB (ref 11.5–15.5)
WBC: 9.2 10*3/uL (ref 4.0–10.5)

## 2017-04-18 MED ORDER — TRAMADOL HCL 50 MG PO TABS
50.0000 mg | ORAL_TABLET | Freq: Four times a day (QID) | ORAL | Status: DC | PRN
  Administered 2017-04-18: 50 mg via ORAL
  Filled 2017-04-18: qty 1

## 2017-04-18 MED ORDER — OXYCODONE-ACETAMINOPHEN 7.5-325 MG PO TABS
1.0000 | ORAL_TABLET | Freq: Four times a day (QID) | ORAL | Status: DC | PRN
  Administered 2017-04-18 – 2017-04-22 (×13): 1 via ORAL
  Filled 2017-04-18 (×13): qty 1

## 2017-04-18 NOTE — Progress Notes (Signed)
Progress Note  [name] Date of Encounter: 04/18/2017  Primary Cardiologist: Dr. Shirlee Latch  Subjective   Co. Severe pain at her implant site, no CP or SOB, no palpitations.  She states the pain comes/goes like a cramp, stays for 5 minutes and then eases away, directly at the implant site  Inpatient Medications    Scheduled Meds: . carvedilol  6.25 mg Oral BID WC  . digoxin  0.125 mg Oral Daily  . potassium chloride SA  40 mEq Oral Daily  . sacubitril-valsartan  1 tablet Oral BID  . sodium chloride flush  3 mL Intravenous Q12H  . spironolactone  25 mg Oral Daily  . torsemide  60 mg Oral Daily   Continuous Infusions: . sodium chloride    .  ceFAZolin (ANCEF) IV 1 g (04/18/17 0821)   PRN Meds: sodium chloride, acetaminophen, albuterol, ALPRAZolam, HYDROmorphone (DILAUDID) injection, ondansetron (ZOFRAN) IV, oxyCODONE, sodium chloride flush, traMADol   Vital Signs    Vitals:   04/17/17 2017 04/18/17 0034 04/18/17 0427 04/18/17 0825  BP: 103/65 113/64 (!) 110/52 113/63  Pulse: 96 75 87 100  Resp:  18 18 18   Temp:   97.7 F (36.5 C)   TempSrc:   Oral   SpO2:  97% 94% 100%  Weight:   (!) 345 lb (156.5 kg)   Height:        Intake/Output Summary (Last 24 hours) at 04/18/17 0831 Last data filed at 04/18/17 1914  Gross per 24 hour  Intake              820 ml  Output             2400 ml  Net            -1580 ml   Filed Weights   04/16/17 0415 04/17/17 0529 04/18/17 0427  Weight: (!) 342 lb (155.1 kg) (!) 339 lb 11.2 oz (154.1 kg) (!) 345 lb (156.5 kg)    Telemetry    SR only - Personally Reviewed  ECG    No new EKG - Personally Reviewed  Physical Exam   GEN: No acute distress.   Neck: No JVD Cardiac: RRR, no murmurs, rubs, or gallops.  Respiratory: Clear to auscultation bilaterally. GI: Soft, nontender, non-distended, obese MS: No edema; No deformity. Neuro:  Nonfocal  Psych: Normal affect   ICD site looks good, no hematoma or  ecchymosis, no bleeding/drainage  Labs    Chemistry Recent Labs Lab 04/11/17 2229  04/14/17 0534 04/15/17 0352 04/16/17 0252  NA 137  < > 140 135 137  K 3.7  < > 4.1 3.7 3.7  CL 99*  < > 102 99* 98*  CO2 27  < > 31 28 31   GLUCOSE 112*  < > 107* 101* 105*  BUN 11  < > 12 11 12   CREATININE 0.99  < > 1.07* 0.96 0.97  CALCIUM 9.3  < > 9.1 9.1 9.3  PROT 7.8  --   --   --   --   ALBUMIN 3.3*  --   --   --   --   AST 23  --   --   --   --   ALT 15  --   --   --   --   ALKPHOS 61  --   --   --   --   BILITOT 4.1*  --   --   --   --   GFRNONAA >  60  < > >60 >60 >60  GFRAA >60  < > >60 >60 >60  ANIONGAP 11  < > 7 8 8   < > = values in this interval not displayed.   Hematology  Recent Labs Lab 04/11/17 2229  WBC 6.8  RBC 5.02  HGB 12.7  HCT 37.8  MCV 75.3*  MCH 25.3*  MCHC 33.6  RDW 16.2*  PLT 330       BNP  Recent Labs Lab 04/11/17 2229  BNP 722.5*       Radiology    Dg Chest 2 View Result Date: 04/18/2017 CLINICAL DATA:  AICD. EXAM: CHEST  2 VIEW COMPARISON:  01/14/2016 . FINDINGS: AICD noted with tip projected over the right ventricle. Cardiomegaly. Mild pulmonary vascular prominence noted. No focal alveolar infiltrate. No pleural effusion or pneumothorax . IMPRESSION: AICD noted with tip over right ventricle. Cardiomegaly with mild pulmonary vascular prominence . Electronically Signed   By: Maisie Fus  Register   On: 04/18/2017 06:41    Cardiac Studies   04/13/17: TTE Study Conclusions - Left ventricle: LVEF is severely depressed a approximately 15 to 20% with hypokinesis with severe hypokinesis, septal and inferior wall appear akinetic. . The cavity size was severely dilated. Wall thickness was normal. - Left atrium: The atrium was moderately dilated. - Right atrium: The atrium was mildly dilated. - Pericardium, extracardiac: A small pericardial effusion was identified.  06/18/16: TTE Impressions: - Severely dilated LV with EF 20%, diffuse  hypokinesis. Normal RV size with mildly decreased systolic function. 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 Mild pulmonary hypertension.  01/18/16: Cardiac cath 1. 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 Near syncope w/NICM     On medical tx, EF remains depressed     Suspect post-partum, discussion in notes, no MRI given body habitus     s/p ICD implant yesterday with Dr. Elberta Fortis     Pt c/o significant site pain (not CP), pain meds ordered/adjusted She states the pain comes in waves then eases off, almost like a cramping in the muscle, I suspect a component is that keeping her shoulder/arm flexed and pulled up, her hand in a tight fist (though at this moment she says the pain is better and comes in waves, pt is instructed to try not to flex up her shoulder/and keep her arm relaxed      CXR this morning is without ptx     Device check this morning is stable with good measurements and functioning as programmed     Wound care and activity restrictions were reviewed with the patient.     Routine post implant follow has been arranged.  Looks like discharge tomorrow, Maeven Mcdougall ask outer dressing be removed day of discharge, steri-strips to remain in place at discharge Would not expect that she should need more then 2-3 days of pain medicines to home when discharged  2. CHF exacerbation     she continues to feel well     weight this AM likely inaccurate, reports up 6lbs     Cumulatively fluid negative -[id number]     changed to PO diuretic yesterday         3. Morbid obesity     She has been counseled and seems motiveated   4. Very likely has sleep apnea  Counseled, she is willing to get sleep study done     Macaila Tahir f/u with Der. Mclean out patient  5. Patient reported to Dr. Graciela Husbands, a period of time  with this event of difficult speech     Slurred is described by her partner     Resolved spontaneously and remains resolved     ? TIA      Amauris Debois be able to monitor for any AF via her device       Signed, Sheilah Pigeon, PA-C  04/18/2017, 8:31 AM     I have seen and examined this patient with Francis Dowse.  Agree with above, note added to reflect my findings.  On exam, RRR, no murmurs, lungs clear. Had ICD implanted for primary prevention. CXR and interrogation without issues. Plan for discharge today with follow up in device clinic..    Wendelin Reader M. Linkyn Gobin MD 04/18/2017 12:02 PM

## 2017-04-18 NOTE — Progress Notes (Addendum)
Pt reports continued pain with lack of dilaudid/use of tramadol.  Pt is in tears, shaking her legs.  Pt advised to put a bra or shirt on that may alleviate some of the pull from her heavy chest, on the surgical site.   With assistance from s/o at bedside, pt is putting her bra on.   Page to [phone], spoke to [employer]. She states she will come see the patient.

## 2017-04-18 NOTE — Progress Notes (Signed)
Pt with petite BP again. Coreg held for sake of lowering. Pt did have a drop in BP this am after coreg, and also needs her Entresto again tonight.   Will advise night shift to reconsider coreg dose if pt seems to have a lift in BP.

## 2017-04-18 NOTE — Progress Notes (Signed)
Called by bedside RN for uncontrolled pain in this patient. On arrival patient is sitting up in bed with right arm propped up. She reports sharp pain at the ICD site, intermittent in nature. Takes her breath away when it comes on. According to bedside RN, patient has been refusing po Oxycodone, but has been taking dilaudid and tramadol. I explained to the patient that since the IV dilaudid is not working, will discontinue this and provide her with Percocet to provide more long lasting pain relief. She is agreeable. No sign of hematoma at ICD site. Hemoglobin stable this am. Will continue to follow.    Little Ishikawa 3:38 PM

## 2017-04-18 NOTE — Progress Notes (Signed)
CARDIAC REHAB PHASE I   PRE:  Rate/Rhythm: 98 SR    BP: sitting 94/61    SaO2: 87 RA in bed  MODE:  Ambulation: 470 ft   POST:  Rate/Rhythm: 113 ST    BP: sitting 108/77     SaO2: 94 RA  Pt in severe pain on dilaudid. Willing to walk. Able to get out of bed with mod assist from me with her other arm. BP low but denied dizziness standing. Slow walk, legs weak at times. Rest x3. Only c/o is pain and just being tired/drowsy from pain meds.  VSS after walk. Encouraged small movements with left arm.  [phone]  Harriet Masson CES, ACSM 04/18/2017 11:39 AM

## 2017-04-18 NOTE — Progress Notes (Signed)
Advanced Heart Failure Rounding Note  PCP: No PCP  Primary Cardiologist: Dr. Shirlee Latch   Subjective:    Admitted 04/11/17 with acute on chronic systolic CHF. Echo with EF 20%, RV mild HK.   Weight labile, but diuresis has been brisk. Says that she is in a great amount of pain post ICD implant. Site looks good. No hematoma.   Objective:   Weight Range: (!) 345 lb (156.5 kg) Body mass index is 57.41 kg/m.   Vital Signs:   Temp:  [97.4 F (36.3 C)-97.7 F (36.5 C)] 97.7 F (36.5 C) (08/03 0427) Pulse Rate:  [12-156] 87 (08/03 0427) Resp:  [0-29] 18 (08/03 0427) BP: (93-165)/(52-98) 110/52 (08/03 0427) SpO2:  [0 %-99 %] 94 % (08/03 0427) Weight:  [345 lb (156.5 kg)] 345 lb (156.5 kg) (08/03 0427) Last BM Date: 04/16/17  Weight change: Filed Weights   04/16/17 0415 04/17/17 0529 04/18/17 0427  Weight: (!) 342 lb (155.1 kg) (!) 339 lb 11.2 oz (154.1 kg) (!) 345 lb (156.5 kg)    Intake/Output:   Intake/Output Summary (Last 24 hours) at 04/18/17 0806 Last data filed at 04/18/17 0757  Gross per 24 hour  Intake              580 ml  Output             2400 ml  Net            -1820 ml      Physical Exam    General:Obese female, NAD.   HEENT: Normal Neck: Supple. JVP does not appear elevated. Carotids 2+ bilat; no bruits. No thyromegaly or nodule noted. Cor: PMI nondisplaced. Regular rate and rhythm. No M/R/G's.  Lungs: Clear bilaterally. Normal effort.  Abdomen:Obese, soft, non tender. No HSM. No bruits or masses. +BS  Extremities: No cyanosis, clubbing, rash. No peripheral edema.  Neuro: Alert & orientedx3, cranial nerves grossly intact. moves all 4 extremities w/o difficulty. Affect pleasant   Telemetry   NSR - personally reviewed.   EKG    04/11/17 - Sinus tach - personally reviewed.   Labs    CBC No results for input(s): WBC, NEUTROABS, HGB, HCT, MCV, PLT in the last 72 hours. Basic Metabolic Panel  Recent Labs  04/16/17 0252 04/16/17 1006  NA  137  --   K 3.7  --   CL 98*  --   CO2 31  --   GLUCOSE 105*  --   BUN 12  --   CREATININE 0.97  --   CALCIUM 9.3  --   MG  --  1.8   Liver Function Tests No results for input(s): AST, ALT, ALKPHOS, BILITOT, PROT, ALBUMIN in the last 72 hours.  BNP: BNP (last 3 results)  Recent Labs  09/23/16 1450 04/11/17 2229  BNP 194.0* 722.5*      Imaging  Transthoracic Echocardiography 04/13/17 Study Conclusions  - Left ventricle: LVEF is severely depressed a approximately 15 to   20% with hypokinesis with severe hypokinesis, septal and inferior   wall appear akinetic. . The cavity size was severely dilated.   Wall thickness was normal. - Left atrium: The atrium was moderately dilated. - Right atrium: The atrium was mildly dilated. - Pericardium, extracardiac: A small pericardial effusion was   identified.    Medications:     Scheduled Medications: . carvedilol  6.25 mg Oral BID WC  . digoxin  0.125 mg Oral Daily  . potassium chloride SA  40 mEq  Oral Daily  . sacubitril-valsartan  1 tablet Oral BID  . sodium chloride flush  3 mL Intravenous Q12H  . spironolactone  25 mg Oral Daily  . torsemide  60 mg Oral Daily    Infusions: . sodium chloride    .  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  Assessment/Plan   1. 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  - Continue KCl 40 mEq daily.  - Continue Coreg 6.25 mg BID - Continue Spiro 25 mg daily - Continue Entresto 24/26 mg BID - Continue dig 0.125 mcg.  - Will further titrate medications outpatient.   2.   Syncope versus severe presyncope:  - history concerning for VT.  - Keep K >4.0 Mg > 2.0 - Mg 1.8. Will give 2 gm.  - BMET pending today.   3. Morbid obesity - Encouraged weight loss.  - Body mass index is 57.41 kg/m.  - No change.   4. OSA - Needs repeat sleep study outpatient.  -No change.   5. Uncontrolled pain - Getting IV pain meds, says her pain is still uncontrolled.She says she is not comfortable going home since she is in so much pain. She would like to stay another day in the hospital.   Will make follow up in the CHF clinic.   Length of Stay: 7  Little Ishikawa, NP  04/18/2017, 8:06 AM  Advanced Heart Failure Team Pager [phone] (M-F; 7a - 4p)  Please contact [facility] for night-coverage after hours (4p -7a ) and weekends on [email or web address]  Patient seen with NP, agree with the above note.  She is having pain at ICD site today, says it is quite severe.  Site looks ok, no hematoma.  We will recheck in afternoon, if pain better-controlled she can go home but suspect she will end up staying until tomorrow.   Volume status looks ok on current regimen, continue current HF meds.  If BP remains higher, can uptitrate Coreg tomorrow.   Marca Ancona 04/18/2017 9:53 AM

## 2017-04-18 NOTE — Progress Notes (Signed)
Patient ambulating/waking up, petite BP since dilaudid dose this am. Will recheck BP post-walk, then admin medications.

## 2017-04-18 NOTE — Progress Notes (Signed)
Chaplain following up with patient since procedure and since last conversation.  Patient has just been given pain medication and apologizes for falling asleep.  Chaplain just wanted to check in and see how patient was fairing.    04/18/17 1654  Clinical Encounter Type  Visited With Patient  Visit Type Follow-up;Spiritual support

## 2017-04-19 MED ORDER — MAGNESIUM HYDROXIDE 400 MG/5ML PO SUSP
15.0000 mL | Freq: Three times a day (TID) | ORAL | Status: AC | PRN
  Administered 2017-04-19: 15 mL via ORAL
  Filled 2017-04-19: qty 30

## 2017-04-19 MED ORDER — KETOROLAC TROMETHAMINE 30 MG/ML IJ SOLN
30.0000 mg | Freq: Once | INTRAMUSCULAR | Status: AC
  Administered 2017-04-19: 30 mg via INTRAVENOUS
  Filled 2017-04-19: qty 1

## 2017-04-19 NOTE — Progress Notes (Signed)
Pt requests to bump the dose of toradol to later this afternoon, as she reports pain as "tolerable" at this time.

## 2017-04-19 NOTE — Progress Notes (Signed)
Page to cardiology to question preferred pain management plan, and to inquire about noted CT plan for "significant pain" per Dr Lubertha Basque note.

## 2017-04-19 NOTE — Progress Notes (Signed)
Progress Note  Patient Name: Laurrie Toppin Date of Encounter: 04/19/2017  Primary Cardiologist: Elberta Fortis  Subjective   C/o pain at insertion site as well as with deep breathing. Notes productive cough. No fever or chills. Denies sob.  Inpatient Medications    Scheduled Meds: . carvedilol  6.25 mg Oral BID WC  . digoxin  0.125 mg Oral Daily  . ketorolac  30 mg Intravenous Once  . potassium chloride SA  40 mEq Oral Daily  . sacubitril-valsartan  1 tablet Oral BID  . sodium chloride flush  3 mL Intravenous Q12H  . spironolactone  25 mg Oral Daily  . torsemide  60 mg Oral Daily   Continuous Infusions: . sodium chloride     PRN Meds: sodium chloride, acetaminophen, albuterol, ALPRAZolam, ondansetron (ZOFRAN) IV, oxyCODONE-acetaminophen, sodium chloride flush, traMADol   Vital Signs    Vitals:   04/19/17 1000 04/19/17 1039 04/19/17 1042 04/19/17 1120  BP: 105/66 105/66  105/75  Pulse: (!) 108 (!) 108 (!) 108 99  Resp:      Temp:      TempSrc:      SpO2:      Weight:      Height:        Intake/Output Summary (Last 24 hours) at 04/19/17 1317 Last data filed at 04/19/17 1211  Gross per 24 hour  Intake              960 ml  Output             2000 ml  Net            -1040 ml   Filed Weights   04/17/17 0529 04/18/17 0427 04/19/17 0621  Weight: (!) 339 lb 11.2 oz (154.1 kg) (!) 345 lb (156.5 kg) (!) 342 lb 1.6 oz (155.2 kg)    Telemetry    Nsr/sinus tachycardia - Personally Reviewed  ECG    none - Personally Reviewed  Physical Exam   GEN: No acute distress.   Neck: unable to assess JVD Cardiac: RRR, no murmurs, rubs, or gallops.  Respiratory: Clear to auscultation bilaterally. Decreased breath sounds GI: Soft, nontender, non-distended  MS: No edema; No deformity. Neuro:  Nonfocal  Psych: Normal affect   Labs    Chemistry Recent Labs Lab 04/15/17 0352 04/16/17 0252 04/18/17 0833  NA 135 137 136  K 3.7 3.7 4.2  CL 99* 98* 98*  CO2 28 31 27     GLUCOSE 101* 105* 220*  BUN 11 12 19   CREATININE 0.96 0.97 0.96  CALCIUM 9.1 9.3 9.4  GFRNONAA >60 >60 >60  GFRAA >60 >60 >60  ANIONGAP 8 8 11      Hematology Recent Labs Lab 04/18/17 0833  WBC 9.2  RBC 5.77*  HGB 14.8  HCT 43.5  MCV 75.4*  MCH 25.6*  MCHC 34.0  RDW 16.1*  PLT 279    Cardiac EnzymesNo results for input(s): TROPONINI in the last 168 hours. No results for input(s): TROPIPOC in the last 168 hours.   BNPNo results for input(s): BNP, PROBNP in the last 168 hours.   DDimer No results for input(s): DDIMER in the last 168 hours.   Radiology    Dg Chest 2 View  Result Date: 04/18/2017 CLINICAL DATA:  AICD. EXAM: CHEST  2 VIEW COMPARISON:  01/14/2016 . FINDINGS: AICD noted with tip projected over the right ventricle. Cardiomegaly. Mild pulmonary vascular prominence noted. No focal alveolar infiltrate. No pleural effusion or pneumothorax . IMPRESSION: AICD noted  with tip over right ventricle. Cardiomegaly with mild pulmonary vascular prominence . Electronically Signed   By: Maisie Fus  Register   On: 04/18/2017 06:41    Cardiac Studies   none  Patient Profile     36 y.o. female with near syncope and severe and longstanding LV dysfunction, underwent ICD insertion 2 days ago. Severe pain yesterday, better but still present today. Tramadol know help. She has a pleuritic component to her pain but also notes productive cough  Assessment & Plan    1. Chronic systolic heart failure with near syncope - she has undergone ICD insertion. 2. Incisional pain and pleuritic pain after ICD insertion - I suspect her incisional pain will improved. She does have a pleuritic component which could be bronchitis with her productive cough but could also be due to microperforation. Her massive obesity makes the CXR difficult to interpret. I will give her some IV toradol. She is hemodynamically stable. Will watch and see how her pain control looks. If better will allow her to go home with  usual followup. If still with significant pain will consider CT of chest looking for a pericardial effusion.   Signed, Lewayne Bunting, MD  04/19/2017, 1:17 PM  Patient ID: [name], female   DOB: 15-Nov-1980, 36 y.o.   MRN: [id number]

## 2017-04-19 NOTE — Progress Notes (Signed)
Patient reports successful pain control with IV toradol, but reports that control subsiding now around 1740. Pt states the pain does not go away 'completely' with the toradol but it does make it manageable and lets her sleep.     Pt states "why he dont want to send me home with percs". Pt advised it is not common practice to send pt's with ICD/Pacer Placement home with pain medication more than tylenol.   Pt informed of MD's noted plan to r/o pericardial effusion if pain continues to remain significant.

## 2017-04-20 DIAGNOSIS — T82120A Displacement of cardiac electrode, initial encounter: Secondary | ICD-10-CM

## 2017-04-20 NOTE — Progress Notes (Signed)
Progress Note  [name] Date of Encounter: 04/20/2017  Primary Cardiologist: Camnitz  Subjective   "It still hurts when I breath or try to lie down". She notes her incisional pain is much better.  Inpatient Medications    Scheduled Meds: . carvedilol  6.25 mg Oral BID WC  . digoxin  0.125 mg Oral Daily  . potassium chloride SA  40 mEq Oral Daily  . sacubitril-valsartan  1 tablet Oral BID  . sodium chloride flush  3 mL Intravenous Q12H  . spironolactone  25 mg Oral Daily  . torsemide  60 mg Oral Daily   Continuous Infusions: . sodium chloride     PRN Meds: sodium chloride, acetaminophen, albuterol, ALPRAZolam, magnesium hydroxide, ondansetron (ZOFRAN) IV, oxyCODONE-acetaminophen, sodium chloride flush, traMADol   Vital Signs    Vitals:   04/19/17 2100 04/20/17 0500 04/20/17 0606 04/20/17 1010  BP: 91/74 (!) 75/48 (!) 86/49 115/76  Pulse: 70 85  (!) 108  Resp: 20 20    Temp: 99.5 F (37.5 C) 99 F (37.2 C)    TempSrc: Oral Oral    SpO2:      Weight:  (!) 344 lb 11.2 oz (156.4 kg)    Height:        Intake/Output Summary (Last 24 hours) at 04/20/17 1201 Last data filed at 04/20/17 0242  Gross per 24 hour  Intake              360 ml  Output             1800 ml  Net            -1440 ml   Filed Weights   04/18/17 0427 04/19/17 0621 04/20/17 0500  Weight: (!) 345 lb (156.5 kg) (!) 342 lb 1.6 oz (155.2 kg) (!) 344 lb 11.2 oz (156.4 kg)     Telemetry    Nsr/sinus tachy - Personally Reviewed  ECG    none - Personally Reviewed  Physical Exam   GEN: morbidly obese, no acute distress.   Neck: 7 cm JVD Cardiac: Reg tachy, no murmurs, rubs, or gallops.  Respiratory: Clear to auscultation bilaterally. GI: Soft, nontender, non-distended  MS: No edema; No deformity. Neuro:  Nonfocal  Psych: Normal affect   Labs    Chemistry Recent Labs Lab 04/15/17 0352 04/16/17 0252 04/18/17 0833  NA 135 137 136  K 3.7 3.7 4.2  CL 99* 98* 98*    CO2 28 31 27   GLUCOSE 101* 105* 220*  BUN 11 12 19   CREATININE 0.96 0.97 0.96  CALCIUM 9.1 9.3 9.4  GFRNONAA >60 >60 >60  GFRAA >60 >60 >60  ANIONGAP 8 8 11      Hematology Recent Labs Lab 04/18/17 0833  WBC 9.2  RBC 5.77*  HGB 14.8  HCT 43.5  MCV 75.4*  MCH 25.6*  MCHC 34.0  RDW 16.1*  PLT 279    Cardiac EnzymesNo results for input(s): TROPONINI in the last 168 hours. No results for input(s): TROPIPOC in the last 168 hours.   BNPNo results for input(s): BNP, PROBNP in the last 168 hours.   DDimer No results for input(s): DDIMER in the last 168 hours.   Radiology    No results found.  Cardiac Studies   none  Patient Profile     36 y.o. female admitted with near syncope who underwent ICD insertion, with severe pain post op. Her mid sternal pleuritic pain persists.  Assessment & Plan  1. Postop pleuritic chest pain - her pain is not better with toradol, is worse when she lies down and better but not relieved with sitting up. She has persistent sinus tachycardia. I have recommended she undergon lead revision. I strongly suspect that her pain is due to microperforation of the lead into the pericardium.  2. Chronic systolic heart failure - her symptoms are well compensated at this point.   Signed, Lewayne Bunting, MD  04/20/2017, 12:01 PM  [name], female   DOB: 1980-10-23, 36 y.o.   MRN: [id number]

## 2017-04-21 ENCOUNTER — Inpatient Hospital Stay (HOSPITAL_COMMUNITY)
Admission: AD | Disposition: A | Discharge status: Home Health | Payer: Self-pay | Source: Other Acute Inpatient Hospital | Attending: Internal Medicine

## 2017-04-21 ENCOUNTER — Encounter (HOSPITAL_COMMUNITY): Payer: Self-pay | Admitting: Cardiology

## 2017-04-21 DIAGNOSIS — T82120A Displacement of cardiac electrode, initial encounter: Secondary | ICD-10-CM

## 2017-04-21 HISTORY — PX: LEAD REVISION/REPAIR: EP1213

## 2017-04-21 LAB — BASIC METABOLIC PANEL
ANION GAP: 11 (ref 5–15)
BUN: 28 mg/dL — AB (ref 6–20)
CO2: 27 mmol/L (ref 22–32)
Calcium: 9.3 mg/dL (ref 8.9–10.3)
Chloride: 96 mmol/L — ABNORMAL LOW (ref 101–111)
Creatinine, Ser: 0.96 mg/dL (ref 0.44–1.00)
Glucose, Bld: 114 mg/dL — ABNORMAL HIGH (ref 65–99)
POTASSIUM: 4.7 mmol/L (ref 3.5–5.1)
SODIUM: 134 mmol/L — AB (ref 135–145)

## 2017-04-21 LAB — MAGNESIUM: MAGNESIUM: 2.3 mg/dL (ref 1.7–2.4)

## 2017-04-21 SURGERY — LEAD REVISION/REPAIR

## 2017-04-21 MED ORDER — SODIUM CHLORIDE 0.9 % IV SOLN
INTRAVENOUS | Status: DC
  Administered 2017-04-21: 10:00:00 via INTRAVENOUS

## 2017-04-21 MED ORDER — HEPARIN (PORCINE) IN NACL 2-0.9 UNIT/ML-% IJ SOLN
INTRAMUSCULAR | Status: AC
  Filled 2017-04-21: qty 500

## 2017-04-21 MED ORDER — GENTAMICIN SULFATE 40 MG/ML IJ SOLN
INTRAMUSCULAR | Status: AC
  Filled 2017-04-21: qty 2

## 2017-04-21 MED ORDER — LIDOCAINE HCL (PF) 1 % IJ SOLN
INTRAMUSCULAR | Status: AC
  Filled 2017-04-21: qty 30

## 2017-04-21 MED ORDER — TORSEMIDE 20 MG PO TABS: 60.0000 mg | ORAL_TABLET | Freq: Every day | ORAL | Status: DC

## 2017-04-21 MED ORDER — MIDAZOLAM HCL 5 MG/5ML IJ SOLN
INTRAMUSCULAR | Status: AC
  Filled 2017-04-21: qty 5

## 2017-04-21 MED ORDER — DEXTROSE 5 % IV SOLN
3.0000 g | INTRAVENOUS | Status: AC
  Administered 2017-04-21: 3 g via INTRAVENOUS
  Filled 2017-04-21 (×2): qty 3000

## 2017-04-21 MED ORDER — FENTANYL CITRATE (PF) 100 MCG/2ML IJ SOLN
INTRAMUSCULAR | Status: AC
  Filled 2017-04-21: qty 2

## 2017-04-21 MED ORDER — LIDOCAINE HCL (PF) 1 % IJ SOLN
INTRAMUSCULAR | Status: DC | PRN
  Administered 2017-04-21: 45 mL via INTRADERMAL

## 2017-04-21 MED ORDER — MIDAZOLAM HCL 5 MG/5ML IJ SOLN
INTRAMUSCULAR | Status: DC | PRN
Start: 2017-04-21 — End: 2017-04-21
  Administered 2017-04-21 (×4): 1 mg via INTRAVENOUS

## 2017-04-21 MED ORDER — TORSEMIDE 20 MG PO TABS
80.0000 mg | ORAL_TABLET | Freq: Every day | ORAL | Status: DC
  Administered 2017-04-22: 80 mg via ORAL
  Filled 2017-04-21: qty 4

## 2017-04-21 MED ORDER — CHLORHEXIDINE GLUCONATE 4 % EX LIQD
60.0000 mL | Freq: Once | CUTANEOUS | Status: AC
  Administered 2017-04-21: 4 via TOPICAL

## 2017-04-21 MED ORDER — ONDANSETRON HCL 4 MG/2ML IJ SOLN: 4.0000 mg | Freq: Four times a day (QID) | INTRAMUSCULAR | Status: DC | PRN

## 2017-04-21 MED ORDER — CHLORHEXIDINE GLUCONATE 4 % EX LIQD
60.0000 mL | Freq: Once | CUTANEOUS | Status: DC
  Filled 2017-04-21: qty 60

## 2017-04-21 MED ORDER — FUROSEMIDE 10 MG/ML IJ SOLN
80.0000 mg | Freq: Once | INTRAMUSCULAR | Status: AC
  Administered 2017-04-21: 80 mg via INTRAVENOUS
  Filled 2017-04-21: qty 8

## 2017-04-21 MED ORDER — FENTANYL CITRATE (PF) 100 MCG/2ML IJ SOLN
INTRAMUSCULAR | Status: DC | PRN
  Administered 2017-04-21 (×4): 25 ug via INTRAVENOUS

## 2017-04-21 MED ORDER — ACETAMINOPHEN 325 MG PO TABS: 325.0000 mg | ORAL_TABLET | ORAL | Status: DC | PRN

## 2017-04-21 MED ORDER — SODIUM CHLORIDE 0.9 % IR SOLN
80.0000 mg | Status: AC
  Administered 2017-04-21: 80 mg
  Filled 2017-04-21: qty 2

## 2017-04-21 MED ORDER — CEFAZOLIN SODIUM-DEXTROSE 1-4 GM/50ML-% IV SOLN
1.0000 g | Freq: Four times a day (QID) | INTRAVENOUS | Status: AC
  Administered 2017-04-21 – 2017-04-22 (×3): 1 g via INTRAVENOUS
  Filled 2017-04-21 (×3): qty 50

## 2017-04-21 SURGICAL SUPPLY — 4 items
CABLE SURGICAL S-101-97-12 (CABLE) ×3 IMPLANT
HOVERMATT SINGLE USE (MISCELLANEOUS) ×3 IMPLANT
PAD DEFIB LIFELINK (PAD) ×3 IMPLANT
TRAY PACEMAKER INSERTION (PACKS) ×3 IMPLANT

## 2017-04-21 NOTE — Progress Notes (Addendum)
Advanced Heart Failure Rounding Note  PCP: No PCP  Primary Cardiologist: Dr. Shirlee Latch   Subjective:    Admitted 04/11/17 with acute on chronic systolic CHF. Echo with EF 20%, RV mild HK.   Feeling OK. Pain better controlled but still present.  Feels like ankles more swollen and more orthopneic.   Weight 345. Out 1.2 L. Ordered for IV lasix this am.   Objective:   Weight Range: (!) 345 lb (156.5 kg) Body mass index is 57.41 kg/m.   Vital Signs:   Temp:  [98.2 F (36.8 C)-98.6 F (37 C)] 98.6 F (37 C) (08/06 0616) Pulse Rate:  [63-109] 103 (08/06 0616) Resp:  [18-20] 18 (08/06 0616) BP: (95-115)/(51-76) 99/61 (08/06 0616) SpO2:  [94 %-98 %] 94 % (08/06 0616) Weight:  [345 lb (156.5 kg)] 345 lb (156.5 kg) (08/06 0616) Last BM Date: 04/20/17  Weight change: Filed Weights   04/19/17 0621 04/20/17 0500 04/21/17 0616  Weight: (!) 342 lb 1.6 oz (155.2 kg) (!) 344 lb 11.2 oz (156.4 kg) (!) 345 lb (156.5 kg)    Intake/Output:   Intake/Output Summary (Last 24 hours) at 04/21/17 0721 Last data filed at 04/21/17 0607  Gross per 24 hour  Intake               60 ml  Output             1350 ml  Net            -1290 ml      Physical Exam    General: Markedly obese, No resp difficulty. HEENT: Normal Neck: Supple. JVP difficult due to body habitus. Carotids 2+ bilat; no bruits. No thyromegaly or nodule noted. Cor: PMI nondisplaced. RRR, No M/G/R noted Lungs: CTAB, normal effort. Abdomen: Soft, non-tender, non-distended, no HSM. No bruits or masses. +BS  Extremities: No cyanosis, clubbing, or rash. Trace to 1+ ankle edema.   Neuro: Alert & orientedx3, cranial nerves grossly intact. moves all 4 extremities w/o difficulty. Affect pleasant   Telemetry   Personally reviewed, NSR -> Sinus tach 90-100s.  EKG    04/11/17 - Sinus tach - personally reviewed.   Labs    CBC  Recent Labs  04/18/17 0833  WBC 9.2  HGB 14.8  HCT 43.5  MCV 75.4*  PLT 279   Basic  Metabolic Panel  Recent Labs  04/18/17 0833  NA 136  K 4.2  CL 98*  CO2 27  GLUCOSE 220*  BUN 19  CREATININE 0.96  CALCIUM 9.4   Liver Function Tests No results for input(s): AST, ALT, ALKPHOS, BILITOT, PROT, ALBUMIN in the last 72 hours.  BNP: BNP (last 3 results)  Recent Labs  09/23/16 1450 04/11/17 2229  BNP 194.0* 722.5*      Imaging  Transthoracic Echocardiography 04/13/17 Study Conclusions  - Left ventricle: LVEF is severely depressed a approximately 15 to   20% with hypokinesis with severe hypokinesis, septal and inferior   wall appear akinetic. . The cavity size was severely dilated.   Wall thickness was normal. - Left atrium: The atrium was moderately dilated. - Right atrium: The atrium was mildly dilated. - Pericardium, extracardiac: A small pericardial effusion was   identified.  Medications:     Scheduled Medications: . carvedilol  6.25 mg Oral BID WC  . digoxin  0.125 mg Oral Daily  . furosemide  80 mg Intravenous Once  . potassium chloride SA  40 mEq Oral Daily  . sacubitril-valsartan  1  tablet Oral BID  . sodium chloride flush  3 mL Intravenous Q12H  . spironolactone  25 mg Oral Daily  . torsemide  60 mg Oral Daily    Infusions: . 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  Assessment/Plan   1. 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  - NYHA II-III, feels like her fluid is coming back.  - Agree with dose of IV lasix 80 mg this am.  Check BMET and Mg. - Plan to continue torsemide 60 mg for home.  - Continue KCl 40 mEq daily.  - Continue coreg 6.25 mg BID.   -  Continue Spiro 25 mg daily - Continue Entresto 24/26 mg BID - Continue dig 0.125 mcg.  - May titrate medications as tolerated pending labs.   2.  Syncope versus severe presyncope:  - history concerning for VT.  Now s/p ICD placement.  - Keep K >4.0 Mg > 2.0 - No labs since 04/18/17. Will check BMET and Mg.   3. Morbid obesity - Encouraged weight loss.  - Body mass index is 57.41 kg/m.  - No change.  4. OSA - Needs repeat sleep study outpatient.  - No change.   5. Post op pleuritic chest pain - EP recommends lead revision and suspects microperforation of the lead into the pericardium. - Plan for lead revision today. She is NPO.    Has follow up next week in HF clinic. Somewhat tachycardic. Will not up-titrate meds with soft pressure and upcoming procedure.    Length of Stay: [address]  Luane School  04/21/2017, 7:21 AM  Advanced Heart Failure Team Pager [phone] (M-F; 7a - 4p)  Please contact [facility] for night-coverage after hours (4p -7a ) and weekends on [email or web address]  Patient seen with PA, agree with the above note.  Successful lead revision today.  Got IV Lasix.  Increase torsemide to 80 mg daily for home.   Marca Ancona 04/21/2017 4:18 PM

## 2017-04-21 NOTE — Progress Notes (Signed)
Orthopedic Tech Progress Note Patient Details:  Kirsten Wheeler May 20, 1981 [id number]  Ortho Devices Type of Ortho Device: Arm sling Ortho Device/Splint Location: LUE Ortho Device/Splint Interventions: Application   Saul Fordyce 04/21/2017, 1:56 PM

## 2017-04-21 NOTE — Progress Notes (Signed)
Patient returned from cath lab. Patient site to left chest is clean/dry/intact.  Patient is arousable, however still going back to sleep quickly.  VSS.  Will continue to monitor.

## 2017-04-21 NOTE — Progress Notes (Signed)
Electrophysiology Rounding Note  [name] Date of Encounter: 04/21/2017  Primary Cardiologist: Shirlee Latch Electrophysiologist: Elberta Fortis   Subjective   The patient has persistent pleuritic pain. She feels that she has extra fluid on board.   Inpatient Medications    Scheduled Meds: . carvedilol  6.25 mg Oral BID WC  . digoxin  0.125 mg Oral Daily  . furosemide  80 mg Intravenous Once  . potassium chloride SA  40 mEq Oral Daily  . sacubitril-valsartan  1 tablet Oral BID  . sodium chloride flush  3 mL Intravenous Q12H  . spironolactone  25 mg Oral Daily  . torsemide  60 mg Oral Daily   Continuous Infusions: . sodium chloride     PRN Meds: sodium chloride, acetaminophen, albuterol, ALPRAZolam, magnesium hydroxide, ondansetron (ZOFRAN) IV, oxyCODONE-acetaminophen, sodium chloride flush, traMADol   Vital Signs    Vitals:   04/20/17 1010 04/20/17 1204 04/20/17 2029 04/21/17 0616  BP: 115/76 95/62 (!) 110/51 99/61  Pulse: (!) 108 (!) 109 63 (!) 103  Resp:  20 20 18   Temp:   98.2 F (36.8 C) 98.6 F (37 C)  TempSrc:   Oral Oral  SpO2:  96% 98% 94%  Weight:    (!) 345 lb (156.5 kg)  Height:        Intake/Output Summary (Last 24 hours) at 04/21/17 0712 Last data filed at 04/21/17 6122  Gross per 24 hour  Intake               60 ml  Output             1350 ml  Net            -1290 ml   Filed Weights   04/19/17 0621 04/20/17 0500 04/21/17 0616  Weight: (!) 342 lb 1.6 oz (155.2 kg) (!) 344 lb 11.2 oz (156.4 kg) (!) 345 lb (156.5 kg)    Physical Exam    GEN- The patient is morbidly obese appearing, alert and oriented x 3 today.   Head- normocephalic, atraumatic Eyes-  Sclera clear, conjunctiva pink Ears- hearing intact Oropharynx- clear Neck- supple Lungs- Clear to ausculation bilaterally, normal work of breathing Heart- Regular rate and rhythm GI- soft, NT, ND, + BS Extremities- no clubbing, cyanosis, or edema Skin- no rash or lesion Psych-  euthymic mood, full affect Neuro- strength and sensation are intact  Labs    CBC  Recent Labs  04/18/17 0833  WBC 9.2  HGB 14.8  HCT 43.5  MCV 75.4*  PLT 279   Basic Metabolic Panel  Recent Labs  04/18/17 0833  NA 136  K 4.2  CL 98*  CO2 27  GLUCOSE 220*  BUN 19  CREATININE 0.96  CALCIUM 9.4    Telemetry    Sinus tachycardia (personally reviewed)  Radiology    No results found.   Patient Profile     Kirsten Wheeler is a 36 y.o. female with a past medical history significant for peripartum cardiomyopathy s/p ICD implant with persistent pleuritic pain.   Assessment & Plan    1. Pleuritic chest pain Concern for micro-perf [place] Coupland plan for RV lead revision today She feels that she has some extra fluid on board. Burleigh Brockmann give 1 dose of IV lasix this morning and then try to do case this afternoon. Risks, benefits reviewed with patient by Dr Elberta Fortis who wishes to proceed.  Signed, Gypsy Balsam, NP  04/21/2017, 7:12 AM   I have seen and examined this  patient with Gypsy Balsam.  Agree with above, note added to reflect my findings.  On exam, RRR, no murmurs, lungs clear. Had device placed with incisional pain POD1. Developed pleuritic type pain over the weekend. Likely has microperforation. Plan for lead revision after dose of IV lasix. Risks and benefits discussed. Risks include but not limited to bleeding, infection, tamponade, pneumothorax. The patient understands the risks and has agreed to the procedure.    Hillarie Harrigan M. Pastor Sgro MD 04/21/2017 7:38 AM

## 2017-04-22 ENCOUNTER — Inpatient Hospital Stay (HOSPITAL_COMMUNITY): Admission: RE | Admit: 2017-04-22 | Payer: Medicaid Other | Source: Ambulatory Visit | Admitting: Cardiology

## 2017-04-22 ENCOUNTER — Inpatient Hospital Stay (HOSPITAL_COMMUNITY): Payer: Medicaid Other

## 2017-04-22 ENCOUNTER — Ambulatory Visit (HOSPITAL_COMMUNITY): Payer: Medicaid Other

## 2017-04-22 LAB — BASIC METABOLIC PANEL
Anion gap: 8 (ref 5–15)
BUN: 31 mg/dL — AB (ref 6–20)
CALCIUM: 9.2 mg/dL (ref 8.9–10.3)
CO2: 31 mmol/L (ref 22–32)
Chloride: 94 mmol/L — ABNORMAL LOW (ref 101–111)
Creatinine, Ser: 1.06 mg/dL — ABNORMAL HIGH (ref 0.44–1.00)
GFR calc Af Amer: >60 mL/min (ref >60)
GLUCOSE: 99 mg/dL (ref 65–99)
Potassium: 4.7 mmol/L (ref 3.5–5.1)
Sodium: 133 mmol/L — ABNORMAL LOW (ref 135–145)

## 2017-04-22 MED ORDER — SACUBITRIL-VALSARTAN 24-26 MG PO TABS: 1.0000 | ORAL_TABLET | Freq: Two times a day (BID) | ORAL | 6 refills | Status: DC

## 2017-04-22 MED ORDER — TRAMADOL HCL 50 MG PO TABS: 50.0000 mg | ORAL_TABLET | Freq: Four times a day (QID) | ORAL | 0 refills | Status: DC | PRN

## 2017-04-22 MED ORDER — ACETAMINOPHEN 325 MG PO TABS: 325.0000 mg | ORAL_TABLET | ORAL | Status: DC | PRN

## 2017-04-22 MED ORDER — SPIRONOLACTONE 25 MG PO TABS: 25.0000 mg | ORAL_TABLET | Freq: Every day | ORAL | 6 refills | Status: DC

## 2017-04-22 MED ORDER — TORSEMIDE 20 MG PO TABS: 80.0000 mg | ORAL_TABLET | Freq: Every day | ORAL | 6 refills | Status: DC

## 2017-04-22 MED ORDER — CARVEDILOL 12.5 MG PO TABS: 12.5000 mg | ORAL_TABLET | Freq: Two times a day (BID) | ORAL | 3 refills | Status: DC

## 2017-04-22 MED FILL — Heparin Sodium (Porcine) 2 Unit/ML in Sodium Chloride 0.9%: INTRAMUSCULAR | Qty: 500 | Status: AC

## 2017-04-22 NOTE — Progress Notes (Signed)
Paged MD to sign pt prescriptions awaiting discharge.

## 2017-04-22 NOTE — Discharge Instructions (Signed)
° ° °  Supplemental Discharge Instructions for  Pacemaker/Defibrillator Patients  Activity No heavy lifting or vigorous activity with your left/right arm for 6 to 8 weeks.  Do not raise your left/right arm above your head for one week.  Gradually raise your affected arm as drawn below.             04/26/17                             04/27/17                      04/28/17                  04/29/17 __  NO DRIVING for 6 months  WOUND CARE - Keep the wound area clean and dry.  Do not get this area wet, no showers until cleared to at your wound check visit  . - The tape/steri-strips on your wound will fall off; do not pull them off.  No bandage is needed on the site.  DO  NOT apply any creams, oils, or ointments to the wound area. - If you notice any drainage or discharge from the wound, any swelling or bruising at the site, or you develop a fever > 101? F after you are discharged home, call the office at once.  Special Instructions - You are still able to use cellular telephones; use the ear opposite the side where you have your pacemaker/defibrillator.  Avoid carrying your cellular phone near your device. - When traveling through airports, show security personnel your identification card to avoid being screened in the metal detectors.  Ask the security personnel to use the hand wand. - Avoid arc welding equipment, MRI testing (magnetic resonance imaging), TENS units (transcutaneous nerve stimulators).  Call the office for questions about other devices. - Avoid electrical appliances that are in poor condition or are not properly grounded. - Microwave ovens are safe to be near or to operate.  Additional information for defibrillator patients should your device go off: - If your device goes off ONCE and you feel fine afterward, notify the device clinic nurses. - If your device goes off ONCE and you do not feel well afterward, call 911. - If your device goes off TWICE, call 911. - If your device  goes off THREE times in one day, call 911.  DO NOT DRIVE YOURSELF OR A FAMILY MEMBER WITH A DEFIBRILLATOR TO THE HOSPITAL--CALL 911.

## 2017-04-22 NOTE — Progress Notes (Addendum)
Pt discharge education provided at bedside. Pt telemetry removed. Pt IV discontinued, catheter intact. Pt has all printed prescriptions and entresto card from case management. Awaiting signed prescriptions

## 2017-04-22 NOTE — Discharge Summary (Signed)
Advanced Heart Failure Discharge Note  Discharge Summary   Patient ID: Olanna Schwerdt MRN: [id number], DOB/AGE: [birth year] 36 y.o. Admit date: 04/11/2017 D/C date:     04/22/2017   Primary Discharge Diagnoses:  1. 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 Syncope versus severe presyncope 3. Morbid obesity 4. OSA 5. Post op pleuritic chest pain  Hospital Course:   Aydrian Boullion is a 36 y.o. 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 Pt had an episode of lightheadedness and dizziness along with altered mental status.   Echo 04/13/17 with LVEF 15-20%, Mod LAE, mild LAE  Pt diuresed on IV lasix. 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 Post op course complicated by severe pleuritic chest pain. Per EP, thought to have a microperforation. Taken for lead revision 04/21/17 with improvement of her symptoms.  Examined am of 04/22/17 and though stable for discharge with close follow up as below. Overall patient diuresed 16.8 L, though weight relatively unchanged from admission (actually showed up 2 lbs). Torsemide increased for home.   Patient given 7 day supply of Tramadol for post op pain. [employer] [place] Controlled Substance Reporting System personally Queried. Pt received 2 week prescription for Oxycodone on 03/17/17. This was the only narcotic prescription she has received in the past 6 months.    Discharge Weight: 348 lbs Discharge Vitals: Blood pressure (!) 141/91, pulse (!) 102, temperature 98 F (36.7 C), temperature source Oral, resp. rate 20, height 5\' 5"  (1.651 m), weight (!) 348 lb (157.9 kg), last menstrual period 02/20/2017, SpO2 98 %.  Labs: Lab Results  Component Value Date   WBC 9.2 04/18/2017   HGB 14.8 04/18/2017   HCT 43.5 04/18/2017   MCV 75.4 (L) 04/18/2017   PLT 279 04/18/2017     Recent Labs Lab 04/22/17 0206  NA 133*  K 4.7  CL 94*  CO2 31  BUN 31*    CREATININE 1.06*  CALCIUM 9.2  GLUCOSE 99   No results found for: CHOL, HDL, LDLCALC, TRIG BNP (last 3 results)  Recent Labs  09/23/16 1450 04/11/17 2229  BNP 194.0* 722.5*    ProBNP (last 3 results) No results for input(s): PROBNP in the last 8760 hours.   Diagnostic Studies/Procedures   Transthoracic Echocardiography 04/13/17 Study Conclusions  - Left ventricle: LVEF is severely depressed a approximately 15 to 20% with hypokinesis with severe hypokinesis, septal and inferior wall appear akinetic. . The cavity size was severely dilated. Wall thickness was normal. - Left atrium: The atrium was moderately dilated. - Right atrium: The atrium was mildly dilated. - Pericardium, extracardiac: A small pericardial effusion was identified.  Discharge Medications   Allergies as of 04/22/2017   No Known Allergies     Medication List    STOP taking these medications   furosemide 40 MG tablet Commonly known as:  LASIX   losartan 25 MG tablet Commonly known as:  COZAAR     TAKE these medications   acetaminophen 325 MG tablet Commonly known as:  TYLENOL Take 1-2 tablets (325-650 mg total) by mouth every 4 (four) hours as needed for mild pain.   carvedilol 12.5 MG tablet Commonly known as:  COREG Take 1 tablet (12.5 mg total) by mouth 2 (two) times daily with a meal.   DIGOX 0.125 MG tablet Generic drug:  digoxin TAKE 1 TABLET(0.125 MG) BY MOUTH DAILY   potassium chloride SA 20 MEQ tablet Commonly known as:  K-DUR,KLOR-CON Take 20 mEq by mouth daily.   PROAIR HFA 108 (90 Base) MCG/ACT inhaler Generic drug:  albuterol Inhale 2 puffs into the lungs every 6 (six) hours as needed for wheezing or shortness of breath.   sacubitril-valsartan 24-26 MG Commonly known as:  ENTRESTO Take 1 tablet by mouth 2 (two) times daily.   spironolactone 25 MG tablet Commonly known as:  ALDACTONE Take 1 tablet (25 mg total) by mouth daily. What changed:  how much to  take   torsemide 20 MG tablet Commonly known as:  DEMADEX Take 4 tablets (80 mg total) by mouth daily.   traMADol 50 MG tablet Commonly known as:  ULTRAM Take 1 tablet (50 mg total) by mouth every 6 (six) hours as needed for moderate pain.       Disposition   The patient will be discharged in stable condition to home. Discharge Instructions    (HEART FAILURE PATIENTS) Call MD:  Anytime you have any of the following symptoms: 1) 3 pound weight gain in 24 hours or 5 pounds in 1 week 2) shortness of breath, with or without a dry hacking cough 3) swelling in the hands, feet or stomach 4) if you have to sleep on extra pillows at night in order to breathe.    Complete by:  As directed    Diet - low sodium heart healthy    Complete by:  As directed    Heart Failure patients record your daily weight using the same scale at the same time of day    Complete by:  As directed    Increase activity slowly    Complete by:  As directed    STOP any activity that causes chest pain, shortness of breath, dizziness, sweating, or exessive weakness    Complete by:  As directed      Follow-up Information    [facility] Follow up.   Specialty:  Home Health Services Why:  They will do your home health care at your home Contact information: [address] [place] [address] [phone]        [facility] Follow up on 04/29/2017.   Specialty:  Cardiology Why:  at 8:30 am for hosptial follow up. Garage code [id number].  Contact information: [address] [id number] mc [place] [address] [phone]       [facility] [address] [facility] Follow up on 04/29/2017.   Specialty:  Cardiology Why:  3:00PM, wound check visit Contact information: [address] [place] [address] [phone]       Regan Lemming, MD Follow up on 07/22/2017.   Specialty:  Cardiology Why:   11:30AM Contact information: [address] [place] [address] [phone]             Duration of Discharge Encounter: Greater than 35 minutes   Signed, Graciella Freer, PA-C 04/22/2017, 7:52 AM

## 2017-04-22 NOTE — Progress Notes (Signed)
Patient provided with Ollen Barges 30 day card. Patient verbalized understanding for use. Advised to follow up w PCP this week to address Enestro refills. Verified with Philis Fendt, clinical liaison [facility] that they will follow up w [facility] services.

## 2017-04-22 NOTE — Progress Notes (Signed)
Pt discharged via wheelchair with volunteer services. Pt has all belongings including pacer box, entresto card and printed prescription.

## 2017-04-22 NOTE — Progress Notes (Signed)
Electrophysiology Rounding Note  [name] Date of Encounter: 04/22/2017  Primary Cardiologist: Shirlee Latch Electrophysiologist: Elberta Fortis   Subjective   The patient is doing well this morning. Pleuritic pain resolved.   Inpatient Medications    Scheduled Meds: . carvedilol  6.25 mg Oral BID WC  . digoxin  0.125 mg Oral Daily  . potassium chloride SA  40 mEq Oral Daily  . sacubitril-valsartan  1 tablet Oral BID  . sodium chloride flush  3 mL Intravenous Q12H  . spironolactone  25 mg Oral Daily  . torsemide  80 mg Oral Daily   Continuous Infusions: . sodium chloride     PRN Meds: sodium chloride, acetaminophen, albuterol, ALPRAZolam, ondansetron (ZOFRAN) IV, oxyCODONE-acetaminophen, sodium chloride flush, traMADol   Vital Signs    Vitals:   04/21/17 1400 04/21/17 1430 04/21/17 2042 04/22/17 0522  BP: 108/64 106/70 123/71 (!) 141/91  Pulse:   98 (!) 102  Resp:   18 20  Temp:   97.8 F (36.6 C) 98 F (36.7 C)  TempSrc:   Oral Oral  SpO2:   98% 98%  Weight:    (!) 348 lb (157.9 kg)  Height:        Intake/Output Summary (Last 24 hours) at 04/22/17 0731 Last data filed at 04/22/17 0523  Gross per 24 hour  Intake              413 ml  Output             2200 ml  Net            -1787 ml   Filed Weights   04/20/17 0500 04/21/17 0616 04/22/17 0522  Weight: (!) 344 lb 11.2 oz (156.4 kg) (!) 345 lb (156.5 kg) (!) 348 lb (157.9 kg)    Physical Exam    GEN- The patient is morbidly obese appearing, alert and oriented x 3 today.   Head- normocephalic, atraumatic Eyes-  Sclera clear, conjunctiva pink Ears- hearing intact Oropharynx- clear Neck- supple Lungs- Clear to ausculation bilaterally, normal work of breathing Heart- Regular rate and rhythm GI- soft, NT, ND, + BS Extremities- no clubbing, cyanosis, or edema Skin- no rash or lesion Psych- euthymic mood, full affect Neuro- strength and sensation are intact  Labs    CBC No results for  input(s): WBC, NEUTROABS, HGB, HCT, MCV, PLT in the last 72 hours. Basic Metabolic Panel  Recent Labs  04/21/17 0844 04/22/17 0206  NA 134* 133*  K 4.7 4.7  CL 96* 94*  CO2 27 31  GLUCOSE 114* 99  BUN 28* 31*  CREATININE 0.96 1.06*  CALCIUM 9.3 9.2  MG 2.3  --     Telemetry    Sinus rhythm (personally reviewed)  Radiology    No results found.   Patient Profile     Kirsten Wheeler is a 36 y.o. female with a past medical history significant for peripartum cardiomyopathy s/p ICD implant with persistent pleuritic pain. S/p ICD lead revision 04/21/17  Assessment & Plan    1. Pleuritic chest pain Improved after lead revision 04/21/17 Routine wound care and follow up - instructions and appts entered in AVS  2.  Chronic systolic heart failure Per AHF team  Signed, Gypsy Balsam, NP  04/22/2017, 7:31 AM    I have seen and examined this patient with Gypsy Balsam.  Agree with above, note added to reflect my findings.  On exam, RRR, no murmurs, lungs clear. Had lead revision yesterday without issue.  CXR and interrogation with stable lead values. No further pleuritic chest pain. Plan for discharge today with follow up in clinic.    Maybelle Depaoli M. Rhys Anchondo MD 04/22/2017 11:10 AM

## 2017-04-22 NOTE — Progress Notes (Signed)
Advanced Heart Failure Rounding Note  PCP: No PCP  Primary Cardiologist: Dr. Shirlee Latch   Subjective:    Admitted 04/11/17 with acute on chronic systolic CHF. Echo with EF 20%, RV mild HK.   S/p ICD implant 04/17/17. Underwent successful ICD lead revision 04/21/17 for suspected microperforation.  Chest feeling much better. Denies SOB. Anxious to go home. Wants pain medicine.   Out 1.7 L with dose of IV lasix yesterday. Weight shows up 3 lbs. ? Accuracy.  Objective:   Weight Range: (!) 348 lb (157.9 kg) Body mass index is 57.91 kg/m.   Vital Signs:   Temp:  [97.8 F (36.6 C)-98 F (36.7 C)] 98 F (36.7 C) (08/07 0522) Pulse Rate:  [0-281] 102 (08/07 0522) Resp:  [12-24] 20 (08/07 0522) BP: (81-141)/(51-91) 141/91 (08/07 0522) SpO2:  [90 %-99 %] 98 % (08/07 0522) Weight:  [348 lb (157.9 kg)] 348 lb (157.9 kg) (08/07 0522) Last BM Date: 04/21/17  Weight change: Filed Weights   04/20/17 0500 04/21/17 0616 04/22/17 0522  Weight: (!) 344 lb 11.2 oz (156.4 kg) (!) 345 lb (156.5 kg) (!) 348 lb (157.9 kg)    Intake/Output:   Intake/Output Summary (Last 24 hours) at 04/22/17 0726 Last data filed at 04/22/17 0523  Gross per 24 hour  Intake              413 ml  Output             2200 ml  Net            -1787 ml      Physical Exam    General: Markedly obese, No resp difficulty. HEENT: Normal Neck: Supple. JVP 5-6. Carotids 2+ bilat; no bruits. No thyromegaly or nodule noted. Cor: PMI nondisplaced. RRR, No M/G/R noted Lungs: CTAB, normal effort. Abdomen: Soft, non-tender, non-distended, no HSM. No bruits or masses. +BS  Extremities: No cyanosis, clubbing, rash, R and LLE no edema.  Neuro: Alert & orientedx3, cranial nerves grossly intact. moves all 4 extremities w/o difficulty. Affect pleasant   Telemetry   Personally reviewed, NSR -> sinus tach 90-100s   EKG    04/11/17 - Sinus tach - personally reviewed.   Labs    CBC No results for input(s): WBC, NEUTROABS,  HGB, HCT, MCV, PLT in the last 72 hours. Basic Metabolic Panel  Recent Labs  04/21/17 0844 04/22/17 0206  NA 134* 133*  K 4.7 4.7  CL 96* 94*  CO2 27 31  GLUCOSE 114* 99  BUN 28* 31*  CREATININE 0.96 1.06*  CALCIUM 9.3 9.2  MG 2.3  --    Liver Function Tests No results for input(s): AST, ALT, ALKPHOS, BILITOT, PROT, ALBUMIN in the last 72 hours.  BNP: BNP (last 3 results)  Recent Labs  09/23/16 1450 04/11/17 2229  BNP 194.0* 722.5*      Imaging  Transthoracic Echocardiography 04/13/17 Study Conclusions  - Left ventricle: LVEF is severely depressed a approximately 15 to   20% with hypokinesis with severe hypokinesis, septal and inferior   wall appear akinetic. . The cavity size was severely dilated.   Wall thickness was normal. - Left atrium: The atrium was moderately dilated. - Right atrium: The atrium was mildly dilated. - Pericardium, extracardiac: A small pericardial effusion was   identified.  Medications:     Scheduled Medications: . carvedilol  6.25 mg Oral BID WC  . digoxin  0.125 mg Oral Daily  . potassium chloride SA  40 mEq Oral  Daily  . sacubitril-valsartan  1 tablet Oral BID  . sodium chloride flush  3 mL Intravenous Q12H  . spironolactone  25 mg Oral Daily  . torsemide  80 mg Oral Daily    Infusions: . sodium chloride      PRN Medications: sodium chloride, acetaminophen, albuterol, ALPRAZolam, ondansetron (ZOFRAN) IV, oxyCODONE-acetaminophen, sodium chloride flush, traMADol    Patient Profile   36 y.o.femalew/ postpartum cardiomyopathy (EF 20%), morbid obesity presenting with lightheadedness and edema.  Assessment/Plan   1. 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  - NYHA II-III symptoms. - Increase torsemide to 80 mg for home. - Continue KCl 40 mEq daily.  - Increase coreg to home dose of 12.5 mg BID.    - Continue Spiro 25 mg daily - Continue  Entresto 24/26 mg BID - Continue dig 0.125 mcg.   2.  Syncope versus severe presyncope:  - history concerning for VT.  Now s/p ICD placement.  - Keep K >4.0 Mg > 2.0. Stable.  3. Morbid obesity - Encouraged weight loss.  - Body mass index is 57.91 kg/m.  - No change.  4. OSA - Needs repeat sleep study outpatient.  - No change.   5. Post op pleuritic chest pain - s/p lead revision 04/21/17 for suspected microperforation.    She has follow up next week in HF clinic.  Length of Stay: [address]  [employer]  04/22/2017, 7:26 AM  Advanced Heart Failure Team Pager [phone] (M-F; 7a - 4p)  Please contact [facility] for night-coverage after hours (4p -7a ) and weekends on [email or web address]  Patient seen with PA, agree with the above note.  Improved after lead revision, only mild pocket discomfort.  Can go home today on torsemide 80 mg daily with close followup in CHF clinic.   Marca Ancona 04/22/2017

## 2017-04-22 NOTE — Progress Notes (Signed)
CARDIAC REHAB PHASE I   PRE:  Rate/Rhythm: 102 (off tele)    BP: sitting 116/77    SaO2: 96 RA  MODE:  Ambulation: 470 ft   POST:  Rate/Rhythm: 115 ST (off tele)    BP: sitting 127/85     SaO2: 95 RA  Pt eager to walk. SOB this am, more with increased distance. VSS. Reviewed low sodium, fluid restrictions, and daily wts. Pt somewhat receptive.  [phone]   Harriet Masson CES, ACSM 04/22/2017 9:24 AM

## 2017-04-24 ENCOUNTER — Telehealth (HOSPITAL_COMMUNITY): Payer: Self-pay | Admitting: Pharmacist

## 2017-04-24 NOTE — Telephone Encounter (Signed)
Entresto PA approved by  Medicaid through 04/17/18.   [name], PharmD, BCPS, CPP Clinical Pharmacist Pager: [phone] Phone: [phone] 04/24/2017 11:55 AM

## 2017-04-29 ENCOUNTER — Other Ambulatory Visit (HOSPITAL_COMMUNITY): Payer: Self-pay | Admitting: *Deleted

## 2017-04-29 ENCOUNTER — Ambulatory Visit: Payer: Medicaid Other

## 2017-04-29 ENCOUNTER — Telehealth: Payer: Self-pay | Admitting: *Deleted

## 2017-04-29 ENCOUNTER — Ambulatory Visit (INDEPENDENT_AMBULATORY_CARE_PROVIDER_SITE_OTHER): Payer: Medicaid Other | Admitting: *Deleted

## 2017-04-29 ENCOUNTER — Inpatient Hospital Stay (HOSPITAL_COMMUNITY): Admit: 2017-04-29 | Payer: Medicaid Other

## 2017-04-29 VITALS — Temp 98.9°F

## 2017-04-29 DIAGNOSIS — O903 Peripartum cardiomyopathy: Secondary | ICD-10-CM

## 2017-04-29 DIAGNOSIS — I428 Other cardiomyopathies: Secondary | ICD-10-CM

## 2017-04-29 DIAGNOSIS — Z95810 Presence of automatic (implantable) cardiac defibrillator: Secondary | ICD-10-CM

## 2017-04-29 LAB — CUP PACEART INCLINIC DEVICE CHECK
Battery Remaining Longevity: 137 mo
Battery Voltage: 3.11 V
Brady Statistic RV Percent Paced: 0 %
HighPow Impedance: 54 Ohm
Implantable Lead Implant Date: 20180802
Implantable Pulse Generator Implant Date: 20180802
Lead Channel Pacing Threshold Pulse Width: 0.4 ms
Lead Channel Sensing Intrinsic Amplitude: 9.25 mV
Lead Channel Setting Pacing Amplitude: 3.5 V
Lead Channel Setting Pacing Pulse Width: 0.4 ms
Lead Channel Setting Sensing Sensitivity: 0.3 mV
MDC IDC LEAD LOCATION: 753860
MDC IDC MSMT LEADCHNL RV IMPEDANCE VALUE: 342 Ohm
MDC IDC MSMT LEADCHNL RV IMPEDANCE VALUE: 418 Ohm
MDC IDC MSMT LEADCHNL RV PACING THRESHOLD AMPLITUDE: 0.75 V
MDC IDC SESS DTM: 20180814111606

## 2017-04-29 MED ORDER — DIGOXIN 125 MCG PO TABS: ORAL_TABLET | ORAL | 0 refills | Status: DC

## 2017-04-29 NOTE — Telephone Encounter (Signed)
LMOM and LMOVM requesting call back to the Device Clinic.  Gave direct phone number.  Dr. Graciela Husbands determined that patient should take course of antibiotics for possible stitch abscess noted at ICD wound check today.  Verbal order received for cephalexin 500mg  TID x7 days.  Will make patient aware when she returns call.

## 2017-04-29 NOTE — Progress Notes (Addendum)
Wound check appointment. Steri-strips removed. Wound without redness or edema. Incision edges approximated and well healed with exception of pinhole opening along L lateral incision. No stitch noted, site drained small amount of sanguineous drainage. Afebrile today, no fevers/chills at home per patient. Per verbal instructions from Dr. Graciela Husbands, applied antibiotic ointment and bandaid to site. Patient instructed to change ointment/bandaid daily until wound recheck on 8/21 while Dr. Elberta Fortis is in the office. Patient verbalizes understanding of instructions to call if she notes any signs/symptoms of infection in the interim.   Normal device function. Thresholds, sensing, and impedances consistent with implant measurements. Device programmed at 3.5V for extra safety margin until 3 month visit. Histogram distribution appropriate for patient and level of activity. No AF or ventricular arrhythmias noted. Patient educated about wound care, arm mobility, lifting restrictions, shock plan, and Carelink monitor. ROV with Device Clinic on 8/21 for wound recheck and ROV with WC on 07/22/17.  Patient gave verbal permission for the following photo to be included in note:

## 2017-04-29 NOTE — Patient Instructions (Signed)
Monitor the site for worsening drainage, swelling, redness, pain, or fever/chills.  Call the Device Clinic at [phone] if you note any of these symptoms.  Please change the antibiotic ointment and bandaid daily using the supplies given to you today.

## 2017-04-30 MED ORDER — CEPHALEXIN 500 MG PO CAPS: 500.0000 mg | ORAL_CAPSULE | Freq: Three times a day (TID) | ORAL | 0 refills | Status: AC

## 2017-05-05 ENCOUNTER — Ambulatory Visit: Payer: Medicaid Other

## 2017-05-06 ENCOUNTER — Ambulatory Visit (INDEPENDENT_AMBULATORY_CARE_PROVIDER_SITE_OTHER): Payer: Self-pay | Admitting: *Deleted

## 2017-05-06 DIAGNOSIS — O903 Peripartum cardiomyopathy: Secondary | ICD-10-CM

## 2017-05-06 DIAGNOSIS — Z95810 Presence of automatic (implantable) cardiac defibrillator: Secondary | ICD-10-CM

## 2017-05-06 LAB — CUP PACEART INCLINIC DEVICE CHECK
Battery Remaining Longevity: 137 mo
Battery Voltage: 3.09 V
HIGH POWER IMPEDANCE MEASURED VALUE: 56 Ohm
Implantable Lead Implant Date: 20180802
Lead Channel Impedance Value: 361 Ohm
Lead Channel Sensing Intrinsic Amplitude: 12.25 mV
Lead Channel Setting Pacing Amplitude: 3.5 V
Lead Channel Setting Pacing Pulse Width: 0.4 ms
MDC IDC LEAD LOCATION: 753860
MDC IDC MSMT LEADCHNL RV IMPEDANCE VALUE: 456 Ohm
MDC IDC MSMT LEADCHNL RV PACING THRESHOLD AMPLITUDE: 0.75 V
MDC IDC MSMT LEADCHNL RV PACING THRESHOLD PULSEWIDTH: 0.4 ms
MDC IDC MSMT LEADCHNL RV SENSING INTR AMPL: 12.25 mV
MDC IDC PG IMPLANT DT: 20180802
MDC IDC SESS DTM: 20180821172500
MDC IDC SET LEADCHNL RV SENSING SENSITIVITY: 0.3 mV
MDC IDC STAT BRADY RV PERCENT PACED: 0 %

## 2017-05-06 NOTE — Progress Notes (Addendum)
Wound recheck appointment.  Incision edges approximated and well healed.  No drainage, redness, fever, or chills noted.  Patient will finish course of cephalexin tomorrow as planned.  Dr. Elberta Fortis saw patient, recommended keeping scheduled f/u on 07/22/17 at 11:30am.  Patient agrees to continue to monitor for any signs/symptoms of infection at home.  She denies additional questions or concerns at this time.  ICD interrogated. No testing performed. 1 NSVT episode--7bts duration. Patient educated about wound care, arm mobility, lifting restrictions, alert tones, shock plan, and Carelink monitor.

## 2017-05-09 ENCOUNTER — Ambulatory Visit (HOSPITAL_COMMUNITY)
Admission: RE | Admit: 2017-05-09 | Discharge: 2017-05-09 | Disposition: A | Discharge status: Home / Self Care | Payer: Medicaid Other | Source: Ambulatory Visit | Attending: Cardiology | Admitting: Cardiology

## 2017-05-09 ENCOUNTER — Encounter (HOSPITAL_COMMUNITY): Payer: Self-pay

## 2017-05-09 ENCOUNTER — Encounter (HOSPITAL_COMMUNITY): Payer: Self-pay | Admitting: Student

## 2017-05-09 VITALS — BP 118/66 | HR 92 | Wt 340.0 lb

## 2017-05-09 DIAGNOSIS — Z87891 Personal history of nicotine dependence: Secondary | ICD-10-CM | POA: Insufficient documentation

## 2017-05-09 DIAGNOSIS — R0600 Dyspnea, unspecified: Secondary | ICD-10-CM | POA: Diagnosis not present

## 2017-05-09 DIAGNOSIS — Z6841 Body Mass Index (BMI) 40.0 and over, adult: Secondary | ICD-10-CM | POA: Insufficient documentation

## 2017-05-09 DIAGNOSIS — Z95810 Presence of automatic (implantable) cardiac defibrillator: Secondary | ICD-10-CM | POA: Diagnosis not present

## 2017-05-09 DIAGNOSIS — Z720 Tobacco use: Secondary | ICD-10-CM | POA: Diagnosis not present

## 2017-05-09 DIAGNOSIS — I340 Nonrheumatic mitral (valve) insufficiency: Secondary | ICD-10-CM

## 2017-05-09 DIAGNOSIS — G4733 Obstructive sleep apnea (adult) (pediatric): Secondary | ICD-10-CM | POA: Diagnosis not present

## 2017-05-09 DIAGNOSIS — Z79899 Other long term (current) drug therapy: Secondary | ICD-10-CM | POA: Insufficient documentation

## 2017-05-09 DIAGNOSIS — I5022 Chronic systolic (congestive) heart failure: Secondary | ICD-10-CM

## 2017-05-09 DIAGNOSIS — R0683 Snoring: Secondary | ICD-10-CM

## 2017-05-09 DIAGNOSIS — I428 Other cardiomyopathies: Secondary | ICD-10-CM | POA: Diagnosis not present

## 2017-05-09 LAB — MAGNESIUM: Magnesium: 1.6 mg/dL — ABNORMAL LOW (ref 1.7–2.4)

## 2017-05-09 LAB — BASIC METABOLIC PANEL
ANION GAP: 9 (ref 5–15)
BUN: 12 mg/dL (ref 6–20)
CALCIUM: 9.5 mg/dL (ref 8.9–10.3)
CO2: 30 mmol/L (ref 22–32)
CREATININE: 0.84 mg/dL (ref 0.44–1.00)
Chloride: 100 mmol/L — ABNORMAL LOW (ref 101–111)
GFR calc non Af Amer: >60 mL/min (ref >60)
GLUCOSE: 118 mg/dL — AB (ref 65–99)
POTASSIUM: 3.6 mmol/L (ref 3.5–5.1)
SODIUM: 139 mmol/L (ref 135–145)

## 2017-05-09 LAB — DIGOXIN LEVEL: Digoxin Level: <0.2 ng/mL — ABNORMAL LOW (ref 0.8–2.0)

## 2017-05-09 NOTE — Patient Instructions (Signed)
No changes to medication at this time.  Routine lab work today. Will notify you of abnormal results, otherwise no news is good news!  Will schedule you for Cardiopulmonary Exercise Test. This test is done at our Heart Failure Clinic. Please wear comfortable clothes and shoes for this test. Avoid heavy meal before the test (light snack/meal recommended). Avoid caffeine, alcohol, tobacco products 12 hrs before test. Please give 24 hr notice for cancellations/rescheduling: [phone].  Follow up 6-8 weeks with Dr. Shirlee Latch.  Take all medication as prescribed the day of your appointment. Bring all medications with you to your appointment.  Do the following things EVERYDAY: 1) Weigh yourself in the morning before breakfast. Write it down and keep it in a log. 2) Take your medicines as prescribed 3) Eat low salt foods-Limit salt (sodium) to 2000 mg per day.  4) Stay as active as you can everyday 5) Limit all fluids for the day to less than 2 liters

## 2017-05-09 NOTE — Progress Notes (Signed)
[name], female   DOB: 10/24/1980, 36 y.o.   MRN: [id number]    Advanced Heart Failure Clinic Note   PCP: [facility] Primary HF Cardiologist: Dr Shirlee Latch   HPI: Kirsten Wheeler is a 36 y.o. 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   Admitted 7/27 - 04/22/17 with near-syncope and volume overload. Echo showed reduction in EF from previous.  Event concerning for VT so ICD placed 04/17/17. Post-op course complicated by pleuritic chest pain thought to be due to ICD microperforation of cardiac tissue. Taken for lead revision 04/21/17 with improvement.  Overall pt diuresed 16.8L though weights were inaccurate. Discharge weight 348 lbs.   She returns today for post hospital follow up.  Last seen in clinic 09/2016, but followed during admission as above.  Feeling good overall. Weight at home between 339-340. Stable since she left hospital. She has been able to be more active. Can now push the shopping care in the grocery store instead of riding the motorized wheelchair. She denies orthopnea, CP, PND, or peripheral edema. No further near syncope. She does remain lightheaded in the am after taking her medications.   Echo 04/13/17  LVEF 15-20%, Severe LV dilation, Mod LAE, Mild RAE  Labs (7/17): K 3.9, creatinine 0.81, hgb 15.5  Optivol: Interrogated personally. No VF/VF. Pt activity > 1 hr daily. Optivol not yet calibrated.   PMH:  1. Chronic systolic CHF: Nonischemic cardiomyopathy.  No family history of cardiomyopathy, no heavy ETOH, cocaine, or amphetamines.  - Echo (12/16) with EF 20-25%, moderate MR.  - Echo (5/17) with severely dilated LV, EF 10-15%, severe functional MR, mildly decreased RV systolic function. - LHC/RHC (4/09): No significant coronary disease; mean RA 15, PA 61/25 mean 41, mean PCWP 18, CI 1.7 (Fick), PVR 5.4 WU.  - Echo (10/17): EF 20%, severely dilated LV, normal RV size with  mildly decreased systolic function, moderate-severe central MR.  2. Morbid obesity.  3. 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  Echo (10/17) with moderate-severe MR.   Review of systems complete and found to be negative unless listed in HPI.    SH:  Social History   Social History  . Marital status: Single    Spouse name: N/A  . Number of children: N/A  . Years of education: N/A   Occupational History  . Not on file.   Social History Main Topics  . Smoking status: Former Smoker    Quit date: 10/19/2015  . Smokeless tobacco: Never Used  . Alcohol use 0.0 oz/week     Comment: occasionally  . Drug use: Yes    Types: Marijuana     Comment: only socially  . Sexual activity: Yes   Other Topics Concern  . Not on file   Social History Narrative  . No narrative on file    FH:  Family History  Problem Relation Age of Onset  . Heart attack Mother   . Hyperlipidemia Father   . Diabetes Brother     Current Outpatient Prescriptions  Medication Sig Dispense Refill  . acetaminophen (TYLENOL) 325 MG tablet Take 1-2 tablets (325-650 mg total) by mouth every 4 (four) hours as needed for mild pain.    Marland Kitchen albuterol (PROAIR HFA) 108 (90 Base) MCG/ACT inhaler Inhale 2 puffs into the lungs every 6 (six) hours as needed for wheezing or shortness of breath.    . carvedilol (COREG) 12.5 MG tablet Take  1 tablet (12.5 mg total) by mouth 2 (two) times daily with a meal. 60 tablet 3  . digoxin (DIGOX) 0.125 MG tablet TAKE 1 TABLET(0.125 MG) BY MOUTH DAILY 30 tablet 0  . potassium chloride SA (K-DUR,KLOR-CON) 20 MEQ tablet Take 20 mEq by mouth daily.    . sacubitril-valsartan (ENTRESTO) 24-26 MG Take 1 tablet by mouth 2 (two) times daily. 60 tablet 6  . spironolactone (ALDACTONE) 25 MG tablet Take 1 tablet (25 mg total) by mouth daily. 30 tablet 6  . torsemide (DEMADEX) 20 MG tablet Take 4 tablets (80 mg total) by mouth daily. 120 tablet 6  . traMADol (ULTRAM) 50 MG tablet Take 1  tablet (50 mg total) by mouth every 6 (six) hours as needed for moderate pain. 28 tablet 0   No current facility-administered medications for this encounter.    Vitals:   05/09/17 1019  BP: 118/66  Pulse: 92  SpO2: 95%  Weight: (!) 340 lb (154.2 kg)   Wt Readings from Last 3 Encounters:  05/09/17 (!) 340 lb (154.2 kg)  04/22/17 (!) 348 lb (157.9 kg)  09/23/16 (!) 344 lb 12.8 oz (156.4 kg)    PHYSICAL EXAM: General: Well appearing. No resp difficulty. HEENT: Normal Neck: Supple. JVP 7-8 cm. Carotids 2+ bilat; no bruits. No thyromegaly or nodule noted. Cor: PMI nondisplaced. RRR, No M/G/R noted Lungs: CTAB, normal effort. Abdomen: Obese, non-tender, non-distended, no HSM. No bruits or masses. +BS  Extremities: No cyanosis, clubbing, or rash. Trace ankle edema.   Neuro: Alert & orientedx3, cranial nerves grossly intact. moves all 4 extremities w/o difficulty. Affect pleasant   ASSESSMENT & PLAN: 1.Chronic Systolic HF:  Nonischemic cardiomyopathy.  Echo 10/17 with severely dilated LV, EF 20%, moderate-severe central MR.  No history of heavy ETOH or cocaine.  No family history of cardiomyopathy.  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  - NYHA class II-III symptoms.  - Volume status stable on exam. Continue torsemide 80 mg daily. Can take extra 40 mg as needed.  - Continue entresto 24/26 mg BID. - Continue coreg 12.5 mg BID.  - Continue spiro 25 mg daily.  - Continue digoxin 0.125 mg daily.  - Have previously discussed the need to prevent pregnancy with HF meds.  - Reinforced fluid restriction to < 2 L daily, sodium restriction to less than 2000 mg daily, and the importance of daily weights.   2. Suspected OSA/ Snoring:  - PCP is handling. Working on getting her a CPAP mask.  3.Mitral regurgitation:  - No evidence on most recent echo.  4. Morbid Obesity. - Body mass index is 56.58 kg/m.  - Needs to lose a significant amount of weight. Encouraged limiting portions and  increasing activity.   Will schedule for CPX. Will not up-titrate meds with lightheadedness. Labs today. RTC 6-8 weeks.   Graciella Freer, PA-C  05/09/2017

## 2017-05-12 ENCOUNTER — Telehealth: Payer: Self-pay | Admitting: Licensed Clinical Social Worker

## 2017-05-12 ENCOUNTER — Telehealth (HOSPITAL_COMMUNITY): Payer: Self-pay | Admitting: *Deleted

## 2017-05-12 DIAGNOSIS — I5022 Chronic systolic (congestive) heart failure: Secondary | ICD-10-CM

## 2017-05-12 MED ORDER — MAGNESIUM OXIDE -MG SUPPLEMENT 400 (240 MG) MG PO TABS: 400.0000 mg | ORAL_TABLET | Freq: Two times a day (BID) | ORAL | 3 refills | Status: DC

## 2017-05-12 MED ORDER — POTASSIUM CHLORIDE CRYS ER 20 MEQ PO TBCR: 40.0000 meq | EXTENDED_RELEASE_TABLET | Freq: Every day | ORAL | 3 refills | Status: DC

## 2017-05-12 NOTE — Telephone Encounter (Signed)
Basic metabolic panel  Order: [id number]  Status:  Final result Visible to patient:  No (Not Released) Dx:  Chronic systolic heart failure (HCC)  Notes recorded by Georgina Peer, RN on 05/12/2017 at 1:36 PM EDT Patient called back and she is agreeable with plan. Lab appointment scheduled, bmet/mag ordered, and prescriptions sent to pharmacy. ------  Notes recorded by Chyrl Civatte, RN on 05/09/2017 at 4:33 PM EDT Left VM to call back ------  Notes recorded by Graciella Freer, PA-C on 05/09/2017 at 1:39 PM EDT Please start Mag ox 400 mg BID ( Needs to keep her Mg up with recent possible VT)  Please increase potassium to 40 meq daily and recheck BMET 7-10 days.   Thanks!  Casimiro Needle [address] [place], PA-C 05/09/2017 1:39 PM

## 2017-05-12 NOTE — Telephone Encounter (Signed)
CSW referred to assist patient with housing resources. Patient reports she has has been written up by the housing authority where she resides for violations of the apartment complex. She noted that all the violations were from "dirty dishes in the sink, not taking the garbage out and had a dog without permission/appropriate deposit."  Patient states she has a court date tomorrow and hopeful to not get evicted. Patient requesting options as a back up. CSW discussed at length limited options for housing in [place] area. Patient reports she is already on the [facility] waiting list and has contacted [facility] and was told there is a 5 month waiting list for the shelter program. CSW encouraged patient to plead her case and tell her story regarding the violations and her plan to avoid reoccurrence. Patient also noted that she gave the dog away immediately when she was informed of the rule about animals and that she did not plan to get the dog that it was a gift to her daughter that she was unaware of until it arrived in her home.  Patient grateful for the encouragement and support and will reach out to CSW tomorrow after court appearance with outcome. CSW available as needed. Lasandra Beech, LCSW, CCSW-MCS [phone]

## 2017-05-15 ENCOUNTER — Telehealth (HOSPITAL_COMMUNITY): Payer: Self-pay

## 2017-05-15 NOTE — Telephone Encounter (Signed)
Spoke with patient who reports that she has yellow drainage from a pinpoint opening at her incision. Patient denies fever or chills. I offered a DC appointment. Patient is going to call back after she confirms transportation is available.

## 2017-05-15 NOTE — Telephone Encounter (Signed)
Cordelia Pen RN w/ [facility] called to report that yesterday on 8/29 when she visited pt. That pt's ICD site looked great, but on today after pt awakened from a nap she recognized yellow drainage on clothes. Further evaluation Cordelia Pen saw a pinpoint area on incision that was draining yellow fluid. After surgery pt did have yellow drainage from pinpoint area that she received ABX for while keeping it covered and site healed. Will send to device clinic for further f/u.

## 2017-05-21 ENCOUNTER — Inpatient Hospital Stay (HOSPITAL_COMMUNITY): Admission: RE | Admit: 2017-05-21 | Payer: Medicaid Other | Source: Ambulatory Visit

## 2017-05-21 ENCOUNTER — Other Ambulatory Visit (HOSPITAL_COMMUNITY): Payer: Medicaid Other

## 2017-05-22 ENCOUNTER — Ambulatory Visit: Payer: Medicaid Other

## 2017-05-22 ENCOUNTER — Ambulatory Visit (INDEPENDENT_AMBULATORY_CARE_PROVIDER_SITE_OTHER): Payer: Self-pay | Admitting: *Deleted

## 2017-05-22 ENCOUNTER — Telehealth: Payer: Self-pay | Admitting: Cardiology

## 2017-05-22 DIAGNOSIS — I5022 Chronic systolic (congestive) heart failure: Secondary | ICD-10-CM

## 2017-05-22 DIAGNOSIS — I428 Other cardiomyopathies: Secondary | ICD-10-CM

## 2017-05-22 DIAGNOSIS — Z95810 Presence of automatic (implantable) cardiac defibrillator: Secondary | ICD-10-CM

## 2017-05-22 MED ORDER — CEPHALEXIN 500 MG PO CAPS: 500.0000 mg | ORAL_CAPSULE | Freq: Three times a day (TID) | ORAL | 0 refills | Status: AC

## 2017-05-22 NOTE — Telephone Encounter (Signed)
Patient called to inform Device Clinic that she found a ride and she agreed to an appt for today Thursday 05-22-2017 at 2:00 PM.

## 2017-05-22 NOTE — Progress Notes (Signed)
Kirsten Wheeler presents to the [facility] with ICD wound complications. She reports she noticed the incision draining on Thursday night. She denies fever and chills. Gauze removed, scant drainage from inferior incision when pocket is palpated. Approximated mid and superior incision but purulent drainage appears to be just under the skin. Dr. Lovena Le assessed the incision and used a suture removal kit to knick the incision in 2 areas to promote drainage. He prescribed 10 days of Keflex 570m TID and instructed Kirsten Wheeler to wash the area twice daily with warm-hot water and antimicrobial soap to cleanse the incision and surrounding skin. She verbalizes understanding and will return for re-assessment of the wound when Kirsten Wheeler in the office 05/28/17 at 3pm.

## 2017-05-28 ENCOUNTER — Other Ambulatory Visit (HOSPITAL_COMMUNITY): Payer: Self-pay | Admitting: Cardiology

## 2017-05-28 ENCOUNTER — Ambulatory Visit: Payer: Medicaid Other

## 2017-05-28 MED ORDER — DIGOXIN 125 MCG PO TABS: ORAL_TABLET | ORAL | 3 refills | Status: DC

## 2017-05-29 ENCOUNTER — Encounter (HOSPITAL_COMMUNITY): Payer: Self-pay

## 2017-05-29 ENCOUNTER — Encounter: Payer: Self-pay | Admitting: Cardiology

## 2017-06-04 ENCOUNTER — Encounter (HOSPITAL_COMMUNITY): Payer: Self-pay | Admitting: *Deleted

## 2017-06-04 ENCOUNTER — Ambulatory Visit (HOSPITAL_COMMUNITY)
Admission: RE | Admit: 2017-06-04 | Discharge: 2017-06-04 | Disposition: A | Discharge status: Home / Self Care | Payer: Medicaid Other | Source: Ambulatory Visit | Attending: Internal Medicine | Admitting: Internal Medicine

## 2017-06-04 ENCOUNTER — Ambulatory Visit (HOSPITAL_COMMUNITY): Payer: Medicaid Other

## 2017-06-04 ENCOUNTER — Ambulatory Visit (INDEPENDENT_AMBULATORY_CARE_PROVIDER_SITE_OTHER): Payer: Self-pay | Admitting: *Deleted

## 2017-06-04 DIAGNOSIS — Z95810 Presence of automatic (implantable) cardiac defibrillator: Secondary | ICD-10-CM

## 2017-06-04 DIAGNOSIS — I5022 Chronic systolic (congestive) heart failure: Secondary | ICD-10-CM | POA: Insufficient documentation

## 2017-06-04 DIAGNOSIS — R0600 Dyspnea, unspecified: Secondary | ICD-10-CM

## 2017-06-04 DIAGNOSIS — I428 Other cardiomyopathies: Secondary | ICD-10-CM

## 2017-06-04 LAB — MAGNESIUM: MAGNESIUM: 1.5 mg/dL — AB (ref 1.7–2.4)

## 2017-06-04 LAB — BASIC METABOLIC PANEL
Anion gap: 13 (ref 5–15)
BUN: 11 mg/dL (ref 6–20)
CALCIUM: 9.1 mg/dL (ref 8.9–10.3)
CO2: 24 mmol/L (ref 22–32)
Chloride: 100 mmol/L — ABNORMAL LOW (ref 101–111)
Creatinine, Ser: 0.88 mg/dL (ref 0.44–1.00)
GFR calc non Af Amer: >60 mL/min (ref >60)
GLUCOSE: 112 mg/dL — AB (ref 65–99)
Potassium: 3.4 mmol/L — ABNORMAL LOW (ref 3.5–5.1)
Sodium: 137 mmol/L (ref 135–145)

## 2017-06-04 NOTE — Progress Notes (Signed)
Pt was in for her CPX today and wt was up 12 lbs, she denies SOB but does c/o some LE edema. She states she has been taking meds but did not take them today.  Discussed w/Andy Lanna Poche, PA ok for pt to take extra TOrsemide 40 mg for in PM for 2 days, pt is aware and agreeable.  She missed her last appt for labs and so labs were done today.  Pt also c/o pain at defib site, upon review of chart pt was seen in device clinic 9/6 and given Keflex, she was supposed to f/u w/Dr [facility] on 9/12 but missed that appt.  Called device clinic and they will see her today.  Pt aware and agreeable to all above.

## 2017-06-04 NOTE — Progress Notes (Signed)
Just wait until after office visit

## 2017-06-04 NOTE — Progress Notes (Signed)
Patient arrived to CPX appointment with weight up 12 lbs. Patient stated she did not take her medication as normal today due to rushing of trying to make her appointment. Patient also had many complaints of pain  regarding her defib site. Further discussed with Nurse, Meredith Staggers and was addressed per PA. Patient was directed to Dr. Elberta Fortis office for evaluation of site. Will discuss with Dr. Shirlee Latch on whether or not to reschedule and complete CPX prior to follow-up on 10/11 or to wait after that office visit. Patient aware of plan and agreeable to reschedule.     Lesia Hausen, MS, ACSM-RCEP 06/04/2017

## 2017-06-04 NOTE — Addendum Note (Signed)
Addended by: Bethanie Dicker on: 06/04/2017 10:55 AM   Modules accepted: Level of Service

## 2017-06-04 NOTE — Progress Notes (Signed)
Kirsten Wheeler presents to the Device Clinic today for wound evaluation. C/O device site soreness at HF Clinic appt this morning. 2 small open areas without drainage, redness or swelling noted. 2 un-approximated areas were noted at appt 05/22/17. She reports that she had 2 stitches that came out of the incision while washing, stitch noted at left lateral incision- able to pull out within minimal tension. Dr. Elberta Fortis evaluated the site and offered a pocket revision due to thinking that the infection is superficial. He also offered to continue monitoring the incision and washing the incision lightly daily with antibacterial soap. She opted to wait. Appt scheduled 06/11/17 at 10am while Dr. Elberta Fortis here in office to re-evaluate. I instructed her to call if she developed chills or fever and to call if she was unable to make the appt. She verbalizes understanding.

## 2017-06-10 ENCOUNTER — Encounter (HOSPITAL_COMMUNITY): Payer: Self-pay | Admitting: Cardiology

## 2017-06-11 ENCOUNTER — Ambulatory Visit: Payer: Medicaid Other

## 2017-06-16 ENCOUNTER — Ambulatory Visit (INDEPENDENT_AMBULATORY_CARE_PROVIDER_SITE_OTHER): Payer: Medicaid Other | Admitting: *Deleted

## 2017-06-16 DIAGNOSIS — I428 Other cardiomyopathies: Secondary | ICD-10-CM

## 2017-06-16 DIAGNOSIS — I5022 Chronic systolic (congestive) heart failure: Secondary | ICD-10-CM

## 2017-06-16 NOTE — Progress Notes (Signed)
Patient presents to the office for a wound recheck. Wound well healed without redness or edema. Incision edges approximated. Small scab noted on L lateral incision. Dr.Camnitz assessed wound and recommended no changes at this time. Scab was not removed. Patient encouraged to call if she notices any signs of infection in the area. Patient verbalized understanding. Patient to follow up as scheduled.

## 2017-06-24 ENCOUNTER — Telehealth (HOSPITAL_COMMUNITY): Payer: Self-pay | Admitting: *Deleted

## 2017-06-24 NOTE — Telephone Encounter (Signed)
Pt received letter in the mail to call office for lab results. Discussed results with patient she is aware and agreeable. Patient has office visit on 10/11 and said she would schedule lab appointment during that visit.

## 2017-06-26 ENCOUNTER — Inpatient Hospital Stay (HOSPITAL_COMMUNITY): Admission: RE | Admit: 2017-06-26 | Payer: Medicaid Other | Source: Ambulatory Visit | Admitting: Cardiology

## 2017-07-16 ENCOUNTER — Other Ambulatory Visit: Payer: Self-pay | Admitting: Student

## 2017-07-16 ENCOUNTER — Encounter (HOSPITAL_COMMUNITY): Payer: Self-pay | Admitting: *Deleted

## 2017-07-22 ENCOUNTER — Encounter: Payer: Medicaid Other | Admitting: Cardiology

## 2017-07-22 NOTE — Progress Notes (Deleted)
Electrophysiology Office Note   Date:  07/22/2017   ID:  Kirsten Wheeler, DOB 11/01/1980, MRN [id number]  PCP:  Eunice Blase'Buch, Greta, PA-C  Cardiologist:  Shirlee LatchMcLean Primary Electrophysiologist:  Sundance Moise Jorja LoaMartin Meredyth Hornung, MD    No chief complaint on file.    History of Present Illness: Kirsten Wheeler is a 36 y.o. female who is being seen today for the evaluation of CHF at the request of No ref. provider found. Presenting today for electrophysiology evaluation.  She has a history of nonischemic cardiomyopathy, severe mitral regurgitation, morbid obesity.  She had a Medtronic single-chamber ICD implanted 04/17/17 with lead revision on 04/21/17.  Since that time, she has had issues with wound healing.  Today, she denies*** symptoms of palpitations, chest pain, shortness of breath, orthopnea, PND, lower extremity edema, claudication, dizziness, presyncope, syncope, bleeding, or neurologic sequela. The patient is tolerating medications without difficulties.    Past Medical History:  Diagnosis Date  . Asthma   . Chronic systolic CHF (congestive heart failure) (HCC)    a. ECHO 01/15/2016 EF 10-15%. 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  Marland Kitchen. Hearing loss    bilateral  . Mitral regurgitation    a. severe, felt to be functional 2/2 LV dilation   . Morbid obesity (HCC)   . Snoring    Past Surgical History:  Procedure Laterality Date  . CESAREAN SECTION    . TONSILLECTOMY       Current Outpatient Medications  Medication Sig Dispense Refill  . acetaminophen (TYLENOL) 325 MG tablet Take 1-2 tablets (325-650 mg total) by mouth every 4 (four) hours as needed for mild pain. (Patient not taking: Reported on 05/22/2017)    . albuterol (PROAIR HFA) 108 (90 Base) MCG/ACT inhaler Inhale 2 puffs into the lungs every 6 (six) hours as needed for wheezing or shortness of breath.    . carvedilol (COREG) 12.5 MG tablet Take 1 tablet (12.5 mg total) by mouth 2 (two) times daily with a meal. 60 tablet 3  . digoxin (DIGOX)  0.125 MG tablet TAKE 1 TABLET(0.125 MG) BY MOUTH DAILY 30 tablet 3  . Magnesium Oxide 400 (240 Mg) MG TABS Take 1 tablet (400 mg total) by mouth 2 (two) times daily. 60 tablet 3  . potassium chloride SA (K-DUR,KLOR-CON) 20 MEQ tablet Take 2 tablets (40 mEq total) by mouth daily. 60 tablet 3  . sacubitril-valsartan (ENTRESTO) 24-26 MG Take 1 tablet by mouth 2 (two) times daily. 60 tablet 6  . spironolactone (ALDACTONE) 25 MG tablet Take 1 tablet (25 mg total) by mouth daily. 30 tablet 6  . torsemide (DEMADEX) 20 MG tablet Take 4 tablets (80 mg total) by mouth daily. 120 tablet 6  . traMADol (ULTRAM) 50 MG tablet Take 1 tablet (50 mg total) by mouth every 6 (six) hours as needed for moderate pain. (Patient not taking: Reported on 05/22/2017) 28 tablet 0   No current facility-administered medications for this visit.     Allergies:   Patient has no known allergies.   Social History:  The patient  reports that she quit smoking about 21 months ago. she has never used smokeless tobacco. She reports that she drinks alcohol. She reports that she uses drugs. Drug: Marijuana.   Family History:  The patient's family history includes Diabetes in her brother; Heart attack in her mother; Hyperlipidemia in her father.    ROS:  Please see the history of present illness.   Otherwise, review of systems is positive for ***.  All other systems are reviewed and negative.    PHYSICAL EXAM: VS:  There were no vitals taken for this visit. , BMI There is no height or weight on file to calculate BMI. GEN: Well nourished, well developed, in no acute distress  HEENT: normal  Neck: no JVD, carotid bruits, or masses Cardiac: ***RRR; no murmurs, rubs, or gallops,no edema  Respiratory:  clear to auscultation bilaterally, normal work of breathing GI: soft, nontender, nondistended, + BS MS: no deformity or atrophy  Skin: warm and dry, *** device pocket is well healed Neuro:  Strength and sensation are intact Psych:  euthymic mood, full affect  EKG:  EKG {ACTION; IS/IS INO:[id number]} ordered today. Personal review of the ekg ordered shows ***  Device interrogation is reviewed today in detail.  See PaceArt for details.   Recent Labs: 04/11/2017: ALT 15; B Natriuretic Peptide 722.5 04/18/2017: Hemoglobin 14.8; Platelets 279 06/04/2017: BUN 11; Creatinine, Ser 0.88; Magnesium 1.5; Potassium 3.4; Sodium 137    Lipid Panel  No results found for: CHOL, TRIG, HDL, CHOLHDL, VLDL, LDLCALC, LDLDIRECT   Wt Readings from Last 3 Encounters:  05/09/17 (!) 340 lb (154.2 kg)  04/22/17 (!) 348 lb (157.9 kg)  09/23/16 (!) 344 lb 12.8 oz (156.4 kg)      Other studies Reviewed: Additional studies/ records that were reviewed today include: TTE 04/13/17  Review of the above records today demonstrates:  - Left ventricle: LVEF is severely depressed a approximately 15 to   20% with hypokinesis with severe hypokinesis, septal and inferior   wall appear akinetic. . The cavity size was severely dilated.   Wall thickness was normal. - Left atrium: The atrium was moderately dilated. - Right atrium: The atrium was mildly dilated. - Pericardium, extracardiac: A small pericardial effusion was   identified.   ASSESSMENT AND PLAN:  1.  Nonischemic cardiomyopathy: Currently on optimal medical therapy.  Status post Medtronic single-chamber ICD on 04/17/17 with lead revision 04/21/17.  2.  Morbid obesity:***  3.  Snoring:***    Current medicines are reviewed at length with the patient today.   The patient {ACTIONS; HAS/DOES NOT HAVE:19233} concerns regarding her medicines.  The following changes were made today:  {NONE DEFAULTED:18576::"none"}  Labs/ tests ordered today include: *** No orders of the defined types were placed in this encounter.    Disposition:   FU with Zoiee Wimmer {gen number [id number]} {Days to years:10300}  Signed, Jaxyn Rout Jorja Loa, MD  07/22/2017 8:15 AM     [facility] [address] [place] [address] [phone] (office) [phone] (fax)

## 2017-07-23 ENCOUNTER — Encounter: Payer: Self-pay | Admitting: Cardiology

## 2017-07-28 ENCOUNTER — Encounter (HOSPITAL_COMMUNITY): Payer: Medicaid Other | Admitting: Cardiology

## 2017-08-15 ENCOUNTER — Encounter (HOSPITAL_COMMUNITY): Payer: Self-pay

## 2017-09-23 ENCOUNTER — Encounter (HOSPITAL_COMMUNITY): Payer: Medicaid Other | Admitting: Cardiology

## 2017-11-01 DIAGNOSIS — J189 Pneumonia, unspecified organism: Secondary | ICD-10-CM | POA: Diagnosis not present

## 2017-11-01 DIAGNOSIS — J9601 Acute respiratory failure with hypoxia: Secondary | ICD-10-CM | POA: Diagnosis not present

## 2017-11-01 DIAGNOSIS — I160 Hypertensive urgency: Secondary | ICD-10-CM | POA: Diagnosis not present

## 2017-11-01 DIAGNOSIS — E669 Obesity, unspecified: Secondary | ICD-10-CM | POA: Diagnosis not present

## 2017-11-01 DIAGNOSIS — G4733 Obstructive sleep apnea (adult) (pediatric): Secondary | ICD-10-CM | POA: Diagnosis not present

## 2017-11-01 DIAGNOSIS — J45901 Unspecified asthma with (acute) exacerbation: Secondary | ICD-10-CM | POA: Diagnosis not present

## 2017-11-01 DIAGNOSIS — I509 Heart failure, unspecified: Secondary | ICD-10-CM | POA: Diagnosis not present

## 2017-11-02 DIAGNOSIS — J45901 Unspecified asthma with (acute) exacerbation: Secondary | ICD-10-CM | POA: Diagnosis not present

## 2017-11-02 DIAGNOSIS — I160 Hypertensive urgency: Secondary | ICD-10-CM | POA: Diagnosis not present

## 2017-11-02 DIAGNOSIS — G4733 Obstructive sleep apnea (adult) (pediatric): Secondary | ICD-10-CM | POA: Diagnosis not present

## 2017-11-02 DIAGNOSIS — E669 Obesity, unspecified: Secondary | ICD-10-CM | POA: Diagnosis not present

## 2017-11-02 DIAGNOSIS — J9601 Acute respiratory failure with hypoxia: Secondary | ICD-10-CM | POA: Diagnosis not present

## 2017-11-02 DIAGNOSIS — I509 Heart failure, unspecified: Secondary | ICD-10-CM | POA: Diagnosis not present

## 2017-11-02 DIAGNOSIS — J189 Pneumonia, unspecified organism: Secondary | ICD-10-CM | POA: Diagnosis not present

## 2017-11-21 ENCOUNTER — Other Ambulatory Visit (HOSPITAL_COMMUNITY): Payer: Self-pay | Admitting: Internal Medicine

## 2017-12-03 ENCOUNTER — Other Ambulatory Visit (HOSPITAL_COMMUNITY): Payer: Self-pay | Admitting: *Deleted

## 2017-12-03 MED ORDER — DIGOXIN 125 MCG PO TABS: ORAL_TABLET | ORAL | 3 refills | Status: DC

## 2017-12-16 ENCOUNTER — Ambulatory Visit (HOSPITAL_COMMUNITY)
Admission: RE | Admit: 2017-12-16 | Discharge: 2017-12-16 | Disposition: A | Discharge status: Home / Self Care | Payer: Medicaid Other | Source: Ambulatory Visit | Attending: Cardiology | Admitting: Cardiology

## 2017-12-16 ENCOUNTER — Encounter (HOSPITAL_COMMUNITY): Payer: Self-pay

## 2017-12-16 VITALS — BP 118/72 | HR 109 | Wt 368.0 lb

## 2017-12-16 DIAGNOSIS — I5022 Chronic systolic (congestive) heart failure: Secondary | ICD-10-CM

## 2017-12-16 DIAGNOSIS — R0683 Snoring: Secondary | ICD-10-CM | POA: Diagnosis not present

## 2017-12-16 DIAGNOSIS — Z87891 Personal history of nicotine dependence: Secondary | ICD-10-CM | POA: Diagnosis not present

## 2017-12-16 DIAGNOSIS — Z79899 Other long term (current) drug therapy: Secondary | ICD-10-CM | POA: Diagnosis not present

## 2017-12-16 DIAGNOSIS — I428 Other cardiomyopathies: Secondary | ICD-10-CM

## 2017-12-16 DIAGNOSIS — Z6841 Body Mass Index (BMI) 40.0 and over, adult: Secondary | ICD-10-CM | POA: Diagnosis not present

## 2017-12-16 DIAGNOSIS — Z8249 Family history of ischemic heart disease and other diseases of the circulatory system: Secondary | ICD-10-CM | POA: Insufficient documentation

## 2017-12-16 DIAGNOSIS — I340 Nonrheumatic mitral (valve) insufficiency: Secondary | ICD-10-CM

## 2017-12-16 DIAGNOSIS — Z95810 Presence of automatic (implantable) cardiac defibrillator: Secondary | ICD-10-CM | POA: Insufficient documentation

## 2017-12-16 DIAGNOSIS — Z833 Family history of diabetes mellitus: Secondary | ICD-10-CM | POA: Insufficient documentation

## 2017-12-16 LAB — BASIC METABOLIC PANEL
ANION GAP: 11 (ref 5–15)
BUN: 10 mg/dL (ref 6–20)
CALCIUM: 9.5 mg/dL (ref 8.9–10.3)
CO2: 32 mmol/L (ref 22–32)
CREATININE: 0.95 mg/dL (ref 0.44–1.00)
Chloride: 94 mmol/L — ABNORMAL LOW (ref 101–111)
GLUCOSE: 136 mg/dL — AB (ref 65–99)
Potassium: 3.1 mmol/L — ABNORMAL LOW (ref 3.5–5.1)
Sodium: 137 mmol/L (ref 135–145)

## 2017-12-16 MED ORDER — TORSEMIDE 20 MG PO TABS: ORAL_TABLET | ORAL | 11 refills | Status: DC

## 2017-12-16 NOTE — Progress Notes (Signed)
[name], female   DOB: 06-08-1981, 37 y.o.   MRN: [id number]    Advanced Heart Failure Clinic Note   PCP: [facility] Primary HF Cardiologist: Dr Shirlee Latch   HPI: Kirsten Wheeler is a 37 y.o. 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   Admitted 7/27 - 04/22/17 with near-syncope and volume overload. Echo showed reduction in EF from previous.  Event concerning for VT so ICD placed 04/17/17. Post-op course complicated by pleuritic chest pain thought to be due to ICD microperforation of cardiac tissue. Taken for lead revision 04/21/17 with improvement.  Overall pt diuresed 16.8L though weights were inaccurate. Discharge weight 348 lbs.   She presents today for regular follow up. Last seen in August 2018. She has gained 28 lbs since that visit. She takes torsemide 80 mg daily, and took 100 mg daily last week for fluid overload and weight gain. She has had a change in her living situation and is not living with a friend with her two children and several other adults. She does not weight at home. She is not very active. She has been too afraid to consider bariatric surgery. She denies lightheadedness or syncope.  She takes her medicines usually in the afternoon and the late evening. She states she is compliant, but irregular with the timing of her meds.   Echo 04/13/17  LVEF 15-20%, Severe LV dilation, Mod LAE, Mild RAE  Optivol: Interrogated personally. No VT/VF. Pt activity ~1-2 hrs daily. Thoracic impedence trending down with fluid trending up.   PMH:  1. Chronic systolic CHF: Nonischemic cardiomyopathy.  No family history of cardiomyopathy, no heavy ETOH, cocaine, or amphetamines.  - Echo (12/16) with EF 20-25%, moderate MR.  - Echo (5/17) with severely dilated LV, EF 10-15%, severe functional MR, mildly decreased RV systolic function. - LHC/RHC (4/09): No significant coronary disease; mean RA 15,  PA 61/25 mean 41, mean PCWP 18, CI 1.7 (Fick), PVR 5.4 WU.  - Echo (10/17): EF 20%, severely dilated LV, normal RV size with mildly decreased systolic function, moderate-severe central MR.  2. Morbid obesity.  3. 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  Echo (10/17) with moderate-severe MR.   Review of systems complete and found to be negative unless listed in HPI.    SH:  Social History   Socioeconomic History  . Marital status: Single    Spouse name: Not on file  . Number of children: Not on file  . Years of education: Not on file  . Highest education level: Not on file  Occupational History  . Not on file  Social Needs  . Financial resource strain: Not on file  . Food insecurity:    Worry: Not on file    Inability: Not on file  . Transportation needs:    Medical: Not on file    Non-medical: Not on file  Tobacco Use  . Smoking status: Former Smoker    Last attempt to quit: 10/19/2015    Years since quitting: 2.1  . Smokeless tobacco: Never Used  Substance and Sexual Activity  . Alcohol use: Yes    Alcohol/week: 0.0 oz    Comment: occasionally  . Drug use: Yes    Types: Marijuana    Comment: only socially  . Sexual activity: Yes  Lifestyle  . Physical activity:    Days per week: Not on file    Minutes per session: Not on file  .  Stress: Not on file  Relationships  . Social connections:    Talks on phone: Not on file    Gets together: Not on file    Attends religious service: Not on file    Active member of club or organization: Not on file    Attends meetings of clubs or organizations: Not on file    Relationship status: Not on file  . Intimate partner violence:    Fear of current or ex partner: Not on file    Emotionally abused: Not on file    Physically abused: Not on file    Forced sexual activity: Not on file  Other Topics Concern  . Not on file  Social History Narrative  . Not on file   FH:  Family History  Problem Relation Age of Onset   . Heart attack Mother   . Hyperlipidemia Father   . Diabetes Brother    Current Outpatient Medications  Medication Sig Dispense Refill  . acetaminophen (TYLENOL) 325 MG tablet Take 1-2 tablets (325-650 mg total) by mouth every 4 (four) hours as needed for mild pain.    Marland Kitchen albuterol (PROAIR HFA) 108 (90 Base) MCG/ACT inhaler Inhale 2 puffs into the lungs every 6 (six) hours as needed for wheezing or shortness of breath.    . carvedilol (COREG) 12.5 MG tablet Take 1 tablet (12.5 mg total) by mouth 2 (two) times daily with a meal. 60 tablet 3  . digoxin (DIGOX) 0.125 MG tablet TAKE 1 TABLET(0.125 MG) BY MOUTH DAILY 30 tablet 3  . magnesium oxide (MAG-OX) 400 MG tablet Take 400 mg by mouth daily.    . potassium chloride SA (K-DUR,KLOR-CON) 20 MEQ tablet Take 2 tablets (40 mEq total) by mouth daily. 60 tablet 3  . sacubitril-valsartan (ENTRESTO) 24-26 MG Take 1 tablet by mouth 2 (two) times daily. 60 tablet 6  . spironolactone (ALDACTONE) 25 MG tablet Take 1 tablet (25 mg total) by mouth daily. 30 tablet 6  . torsemide (DEMADEX) 20 MG tablet Take 4 tablets (80 mg total) by mouth daily. 120 tablet 6  . traMADol (ULTRAM) 50 MG tablet Take 1 tablet (50 mg total) by mouth every 6 (six) hours as needed for moderate pain. 28 tablet 0   No current facility-administered medications for this encounter.    Vitals:   12/16/17 1358  BP: 118/72  Pulse: (!) 109  SpO2: 97%  Weight: (!) 368 lb (166.9 kg)   Wt Readings from Last 3 Encounters:  12/16/17 (!) 368 lb (166.9 kg)  05/09/17 (!) 340 lb (154.2 kg)  04/22/17 (!) 348 lb (157.9 kg)    PHYSICAL EXAM: General: Morbidly obese. No resp difficulty. HEENT: Normal Neck: Supple. JVP difficult to assess due body habitus. Carotids 2+ bilat; no bruits. No thyromegaly or nodule noted. Cor: PMI nondisplaced. RRR, No M/G/R noted Lungs: CTAB, normal effort. Abdomen: Soft, non-tender, non-distended, no HSM. No bruits or masses. +BS  Extremities: No cyanosis,  clubbing, or rash. 1+ BLE edema.  Neuro: Alert & orientedx3, cranial nerves grossly intact. moves all 4 extremities w/o difficulty. Affect pleasant   EKG: Sinus tachycardia 110 bpm, personally reviewed.   ASSESSMENT & PLAN: 1.Chronic Systolic HF:  Nonischemic cardiomyopathy.  Echo 10/17 with severely dilated LV, EF 20%, moderate-severe central MR.  No history of heavy ETOH or cocaine.  No family history of cardiomyopathy.  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  - Echo LVEF 03/2017 LVEF 15-20%.Mod LAE, Mild RAE, small pericardial effusion.   - NYHA III  symptoms - Volume status elevated on exam - Increase torsemide to 80 mg q am and 40 mg q pm. Stressed compliance, and reviewed sliding scale.  - Continue entresto 24/26 mg BID. - Continue coreg 12.5 mg BID.  - Continue spiro 25 mg daily.  - Continue digoxin 0.125 mg daily.  - Have previously discussed the need to prevent pregnancy with HF meds.  - Reinforced fluid restriction to < 2 L daily, sodium restriction to less than 2000 mg daily, and the importance of daily weights.   2. Suspected OSA/ Snoring:  - She is non-compliant to CPAP due to mouth dryness.  3.Mitral regurgitation:  - No evidence on most recent echo.  4. Morbid Obesity. - Body mass index is 61.24 kg/m.  - Stressed importance of losing weight. Given information to contact bariatric services.   She wants to try and lose weight prior to CPX. Increase torsemide. Long discussion concerning sliding scale diuretics. BMET today and 2 weeks. RTC 4 weeks for close follow up for now.   Graciella Freer, PA-C  12/16/2017   Greater than 50% of the 25 minute visit was spent in counseling/coordination of care regarding disease state education, salt/fluid restriction, sliding scale diuretics, and medication compliance.

## 2017-12-16 NOTE — Patient Instructions (Signed)
Routine lab work today. Will notify you of abnormal results, otherwise no news is good news!  INCREASE Torsemide to 80 mg (4 tabs) in am and 40 mg (2 tabs) in pm.  Return in 2 weeks for labs.  _____________________________________________________________ [place] Code: 1100  Follow up 4 weeks with Otilio Saber PA-C.  _____________________________________________________________ [place] Code:   Take all medication as prescribed the day of your appointment. Bring all medications with you to your appointment.  Do the following things EVERYDAY: 1) Weigh yourself in the morning before breakfast. Write it down and keep it in a log. 2) Take your medicines as prescribed 3) Eat low salt foods-Limit salt (sodium) to 2000 mg per day.  4) Stay as active as you can everyday 5) Limit all fluids for the day to less than 2 liters

## 2017-12-17 ENCOUNTER — Telehealth (HOSPITAL_COMMUNITY): Payer: Self-pay

## 2017-12-17 NOTE — Telephone Encounter (Signed)
Result Notes for Basic metabolic panel   Notes recorded by Chyrl Civatte, RN on 12/17/2017 at 9:10 AM EDT Patient states she ran out "for about a week". Refilled them yesterday during appointment and took them this morning. Confirms she is taking 2 tabs (40 meq) once daily in the am. Reminded of 2 week f/u appt to reassess labs and stressed importance of med compliance. ------  Notes recorded by Theresia Bough, CMA on 12/16/2017 at 4:12 PM EDT Left message for patient to call back.[phone]   ------  Notes recorded by Graciella Freer, PA-C on 12/16/2017 at 3:41 PM EDT  Please confirm potassium dose. If she is taking 40 meq daily, increase to BID. If she is NOT taking. Add 40 meq daily.  Repeat 2 weeks as planned.     Casimiro Needle [address]" [place], PA-C 12/16/2017 3:41 PM

## 2017-12-30 ENCOUNTER — Other Ambulatory Visit (HOSPITAL_COMMUNITY): Payer: Medicaid Other

## 2018-01-02 ENCOUNTER — Ambulatory Visit (HOSPITAL_COMMUNITY)
Admission: RE | Admit: 2018-01-02 | Discharge: 2018-01-02 | Disposition: A | Discharge status: Home / Self Care | Payer: Medicaid Other | Source: Ambulatory Visit | Attending: Cardiology | Admitting: Cardiology

## 2018-01-02 DIAGNOSIS — I5022 Chronic systolic (congestive) heart failure: Secondary | ICD-10-CM | POA: Diagnosis present

## 2018-01-02 LAB — BASIC METABOLIC PANEL
ANION GAP: 13 (ref 5–15)
BUN: 12 mg/dL (ref 6–20)
CALCIUM: 9.4 mg/dL (ref 8.9–10.3)
CO2: 29 mmol/L (ref 22–32)
Chloride: 97 mmol/L — ABNORMAL LOW (ref 101–111)
Creatinine, Ser: 0.84 mg/dL (ref 0.44–1.00)
GFR calc Af Amer: >60 mL/min (ref >60)
GLUCOSE: 116 mg/dL — AB (ref 65–99)
Potassium: 3 mmol/L — ABNORMAL LOW (ref 3.5–5.1)
SODIUM: 139 mmol/L (ref 135–145)

## 2018-01-06 ENCOUNTER — Telehealth (HOSPITAL_COMMUNITY): Payer: Self-pay | Admitting: Cardiology

## 2018-01-06 MED ORDER — POTASSIUM CHLORIDE CRYS ER 20 MEQ PO TBCR: 40.0000 meq | EXTENDED_RELEASE_TABLET | Freq: Two times a day (BID) | ORAL | 3 refills | Status: DC

## 2018-01-06 NOTE — Telephone Encounter (Signed)
-----   Message from Graciella Freer, PA-C sent at 01/05/2018  7:11 AM EDT ----- Please confirm she started back her K as directed.  If she did, increase to 40 meq BID.     Casimiro Needle [address] [place], PA-C 01/05/2018 7:10 AM

## 2018-01-06 NOTE — Telephone Encounter (Signed)
Notes recorded by Theresia Bough, CMA on 01/06/2018 at 3:09 PM EDT Patient aware. Reports she has been taking potassium, will increase to 40 BID   ------  Notes recorded by Graciella Freer, PA-C on 01/05/2018 at 7:11 AM EDT Please confirm she started back her K as directed. If she did, increase to 40 meq BID.     Casimiro Needle [address] [place], PA-C 01/05/2018 7:10 AM

## 2018-01-09 ENCOUNTER — Other Ambulatory Visit: Payer: Self-pay | Admitting: Cardiology

## 2018-01-13 ENCOUNTER — Encounter (HOSPITAL_COMMUNITY): Payer: Medicaid Other

## 2018-01-20 ENCOUNTER — Telehealth: Payer: Self-pay | Admitting: Licensed Clinical Social Worker

## 2018-01-20 NOTE — Telephone Encounter (Signed)
CSW received referral to assist patient with transportation. Patient states she has appointment in the clinic on Monday and staff will call a cab for her to get to appointment. CSW will meet with patient on Monday to discuss further options regarding transport available. Lasandra Beech, LCSW, CCSW-MCS [phone]

## 2018-01-22 ENCOUNTER — Encounter (HOSPITAL_COMMUNITY): Payer: Medicaid Other

## 2018-01-26 ENCOUNTER — Ambulatory Visit (HOSPITAL_COMMUNITY)
Admission: RE | Admit: 2018-01-26 | Discharge: 2018-01-26 | Disposition: A | Discharge status: Home / Self Care | Payer: Medicaid Other | Source: Ambulatory Visit | Attending: Cardiology | Admitting: Cardiology

## 2018-01-26 ENCOUNTER — Encounter: Payer: Self-pay | Admitting: Cardiology

## 2018-01-26 ENCOUNTER — Encounter (HOSPITAL_COMMUNITY): Payer: Self-pay

## 2018-01-26 VITALS — BP 122/68 | HR 97 | Wt 377.4 lb

## 2018-01-26 DIAGNOSIS — Z833 Family history of diabetes mellitus: Secondary | ICD-10-CM | POA: Insufficient documentation

## 2018-01-26 DIAGNOSIS — Z95810 Presence of automatic (implantable) cardiac defibrillator: Secondary | ICD-10-CM | POA: Diagnosis not present

## 2018-01-26 DIAGNOSIS — I340 Nonrheumatic mitral (valve) insufficiency: Secondary | ICD-10-CM | POA: Diagnosis not present

## 2018-01-26 DIAGNOSIS — R0683 Snoring: Secondary | ICD-10-CM | POA: Diagnosis not present

## 2018-01-26 DIAGNOSIS — Z8249 Family history of ischemic heart disease and other diseases of the circulatory system: Secondary | ICD-10-CM | POA: Diagnosis not present

## 2018-01-26 DIAGNOSIS — Z87891 Personal history of nicotine dependence: Secondary | ICD-10-CM | POA: Insufficient documentation

## 2018-01-26 DIAGNOSIS — I5022 Chronic systolic (congestive) heart failure: Secondary | ICD-10-CM | POA: Diagnosis not present

## 2018-01-26 DIAGNOSIS — G4730 Sleep apnea, unspecified: Secondary | ICD-10-CM | POA: Diagnosis not present

## 2018-01-26 DIAGNOSIS — Z6841 Body Mass Index (BMI) 40.0 and over, adult: Secondary | ICD-10-CM | POA: Insufficient documentation

## 2018-01-26 DIAGNOSIS — Z79899 Other long term (current) drug therapy: Secondary | ICD-10-CM | POA: Insufficient documentation

## 2018-01-26 DIAGNOSIS — I428 Other cardiomyopathies: Secondary | ICD-10-CM

## 2018-01-26 LAB — BASIC METABOLIC PANEL
ANION GAP: 6 (ref 5–15)
BUN: 13 mg/dL (ref 6–20)
CALCIUM: 9.1 mg/dL (ref 8.9–10.3)
CO2: 33 mmol/L — ABNORMAL HIGH (ref 22–32)
CREATININE: 0.93 mg/dL (ref 0.44–1.00)
Chloride: 99 mmol/L — ABNORMAL LOW (ref 101–111)
Glucose, Bld: 137 mg/dL — ABNORMAL HIGH (ref 65–99)
Potassium: 3.3 mmol/L — ABNORMAL LOW (ref 3.5–5.1)
SODIUM: 138 mmol/L (ref 135–145)

## 2018-01-26 MED ORDER — SACUBITRIL-VALSARTAN 49-51 MG PO TABS: 1.0000 | ORAL_TABLET | Freq: Two times a day (BID) | ORAL | 11 refills | Status: DC

## 2018-01-26 MED ORDER — SPIRONOLACTONE 25 MG PO TABS: 25.0000 mg | ORAL_TABLET | Freq: Every day | ORAL | 3 refills | Status: DC

## 2018-01-26 MED ORDER — DIGOXIN 125 MCG PO TABS: ORAL_TABLET | ORAL | 3 refills | Status: DC

## 2018-01-26 NOTE — Addendum Note (Signed)
Encounter addended by: Marcy Siren, LCSW on: 01/26/2018 3:13 PM  Actions taken: Sign clinical note

## 2018-01-26 NOTE — Progress Notes (Signed)
[name], female   DOB: 1980/10/07, 37 y.o.   MRN: [id number]    Advanced Heart Failure Clinic Note   PCP: [facility] Primary HF Cardiologist: Dr Shirlee Latch   HPI: Kirsten Wheeler is a 37 y.o. 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   Admitted 7/27 - 04/22/17 with near-syncope and volume overload. Echo showed reduction in EF from previous.  Event concerning for VT so ICD placed 04/17/17. Post-op course complicated by pleuritic chest pain thought to be due to ICD microperforation of cardiac tissue. Taken for lead revision 04/21/17 with improvement.  Overall pt diuresed 16.8L though weights were inaccurate. Discharge weight 348 lbs.   Today she returns for HF follow up. Last visit torsemide was increased. She has been taking 80 mg BID instead of ordered 80 mg am, 40 mg pm. She is up 9 lbs and has not been weighing. Overall, doing okay. She is walking 2-3 blocks daily and has to take breaks for SOB and fatigue. She is SOB with ADL's. Denies orthopnea. +PND when she isn't wearing CPAP. Wearing CPAP ~3 hours/night. Has LLE swelling at times. She has ongoing BLE numbness with walking. No CP or palpitations. She had an episode of dizziness a few days ago when she stood up very quickly from the computer. Compliant with medications, but has been out of spiro for 1.5 weeks. Getting refills today.   Optivol: Thoracic impedence just below threshold. Active 1-2 hours/day. No VT/VF. Reviewed personally.   Echo 04/13/17  LVEF 15-20%, Severe LV dilation, Mod LAE, Mild RAE  PMH:  1. Chronic systolic CHF: Nonischemic cardiomyopathy.  No family history of cardiomyopathy, no heavy ETOH, cocaine, or amphetamines.  - Echo (12/16) with EF 20-25%, moderate MR.  - Echo (5/17) with severely dilated LV, EF 10-15%, severe functional MR, mildly decreased RV systolic function. - LHC/RHC (7/82): No significant coronary  disease; mean RA 15, PA 61/25 mean 41, mean PCWP 18, CI 1.7 (Fick), PVR 5.4 WU.  - Echo (10/17): EF 20%, severely dilated LV, normal RV size with mildly decreased systolic function, moderate-severe central MR.  2. Morbid obesity.  3. 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  Echo (10/17) with moderate-severe MR.   Review of systems complete and found to be negative unless listed in HPI.   SH:  Social History   Socioeconomic History  . Marital status: Single    Spouse name: Not on file  . Number of children: Not on file  . Years of education: Not on file  . Highest education level: Not on file  Occupational History  . Not on file  Social Needs  . Financial resource strain: Not on file  . Food insecurity:    Worry: Not on file    Inability: Not on file  . Transportation needs:    Medical: Not on file    Non-medical: Not on file  Tobacco Use  . Smoking status: Former Smoker    Last attempt to quit: 10/19/2015    Years since quitting: 2.2  . Smokeless tobacco: Never Used  Substance and Sexual Activity  . Alcohol use: Yes    Alcohol/week: 0.0 oz    Comment: occasionally  . Drug use: Yes    Types: Marijuana    Comment: only socially  . Sexual activity: Yes  Lifestyle  . Physical activity:    Days per week: Not on file    Minutes per session:  Not on file  . Stress: Not on file  Relationships  . Social connections:    Talks on phone: Not on file    Gets together: Not on file    Attends religious service: Not on file    Active member of club or organization: Not on file    Attends meetings of clubs or organizations: Not on file    Relationship status: Not on file  . Intimate partner violence:    Fear of current or ex partner: Not on file    Emotionally abused: Not on file    Physically abused: Not on file    Forced sexual activity: Not on file  Other Topics Concern  . Not on file  Social History Narrative  . Not on file   FH:  Family History  Problem  Relation Age of Onset  . Heart attack Mother   . Hyperlipidemia Father   . Diabetes Brother    Current Outpatient Medications  Medication Sig Dispense Refill  . acetaminophen (TYLENOL) 325 MG tablet Take 1-2 tablets (325-650 mg total) by mouth every 4 (four) hours as needed for mild pain.    Marland Kitchen albuterol (PROAIR HFA) 108 (90 Base) MCG/ACT inhaler Inhale 2 puffs into the lungs every 6 (six) hours as needed for wheezing or shortness of breath.    . carvedilol (COREG) 12.5 MG tablet Take 1 tablet (12.5 mg total) by mouth 2 (two) times daily with a meal. 60 tablet 3  . digoxin (DIGOX) 0.125 MG tablet TAKE 1 TABLET(0.125 MG) BY MOUTH DAILY 90 tablet 3  . magnesium oxide (MAG-OX) 400 MG tablet Take 400 mg by mouth daily.    . potassium chloride SA (K-DUR,KLOR-CON) 20 MEQ tablet Take 2 tablets (40 mEq total) by mouth 2 (two) times daily. 120 tablet 3  . torsemide (DEMADEX) 20 MG tablet Take 80 mg (4 tabs) in am and 40 mg (2 tabs) in pm 180 tablet 11  . traMADol (ULTRAM) 50 MG tablet Take 1 tablet (50 mg total) by mouth every 6 (six) hours as needed for moderate pain. 28 tablet 0  . sacubitril-valsartan (ENTRESTO) 49-51 MG Take 1 tablet by mouth 2 (two) times daily. 60 tablet 11  . spironolactone (ALDACTONE) 25 MG tablet Take 1 tablet (25 mg total) by mouth daily. 90 tablet 3   No current facility-administered medications for this encounter.    Vitals:   01/26/18 1157  BP: 122/68  Pulse: 97  SpO2: 98%  Weight: (!) 377 lb 6.4 oz (171.2 kg)   Wt Readings from Last 3 Encounters:  01/26/18 (!) 377 lb 6.4 oz (171.2 kg)  12/16/17 (!) 368 lb (166.9 kg)  05/09/17 (!) 340 lb (154.2 kg)    PHYSICAL EXAM: General: Morbidly obese. No resp difficulty. HEENT: Normal Neck: Supple. JVP difficult to assess with body habitus. Carotids 2+ bilat; no bruits. No thyromegaly or nodule noted. Cor: PMI nondisplaced. RRR, No M/G/R noted Lungs: CTAB, normal effort. Abdomen: obese, s, non-tender, non-distended, no  HSM. No bruits or masses. +BS  Extremities: No cyanosis, clubbing, or rash. R and LLE nonpitting edema Neuro: Alert & orientedx3, cranial nerves grossly intact. moves all 4 extremities w/o difficulty. Affect pleasant   ASSESSMENT & PLAN: 1.Chronic Systolic HF:  Nonischemic cardiomyopathy.  Echo 10/17 with severely dilated LV, EF 20%, moderate-severe central MR.  No history of heavy ETOH or cocaine.  No family history of cardiomyopathy.  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   - NYHA III-IIIb symptoms - Volume status stable - Continue torsemide 80 mg BID. She was ordered 80 mg am, 40 mg pm, but has been taking differently. BMET today.  - Increase Entresto to 49/51 mg BID. BMET today and in 10 days.  - Continue coreg 12.5 mg BID. Will not increase with fatigue.  - Resume spiro 25 mg daily. (she has been out for 1.5 weeks) - Continue digoxin 0.125 mg daily.  - Have previously discussed the need to prevent pregnancy with HF meds.  - Reinforced fluid restriction to < 2 L daily, sodium restriction to less than 2000 mg daily, and the importance of daily weights.  - Schedule CPX  - Repeat echo in 2 months at follow up with Dr Shirlee Latch. 2. Suspected OSA/ Snoring:  - She wears CPAP ~3 hours only due to mouth dryness. Encouraged compliance.  3.Mitral regurgitation:  - No evidence on most recent echo. Repeat echo as above.  4. Morbid Obesity. - Body mass index is 62.8 kg/m.  - Stressed importance of losing weight. Given information to contact bariatric services at last appointment  Schedule CPX Increase Entresto to 49/51 mg BID BMET today and in 10 days F/u 3 months with Dr Shirlee Latch with echo   Alford Highland, NP  01/26/2018   Greater than 50% of the 25 minute visit was spent in counseling/coordination of care regarding disease state education, salt/fluid restriction, sliding scale  diuretics, and medication compliance.

## 2018-01-26 NOTE — Patient Instructions (Signed)
INCREASE Entresto to 49-51 mg twice daily. Can double up on current 24-26 mg tablets you have (Take 2 tabs twice daily). New Rx sent to pharmacy for 49-51 mg tablets (Take 1 tab twice daily).  Resume Spironolactone and digoxin. Rx sent to pharmacy.  Routine lab work today. Will notify you of abnormal results, otherwise no news is good news!  Return in 1-2 weeks for lab and CPX. Will schedule you for Cardiopulmonary Exercise Test. This test is done at our Heart Failure Clinic. Please wear comfortable clothes and shoes for this test. Avoid heavy meal before the test (light snack/meal recommended). Avoid caffeine, alcohol, tobacco products 12 hrs before test. Please give 24 hr notice for cancellations/rescheduling: [phone].  Follow up with Dr. Shirlee Latch and echocardiogram in 2 months.  Take all medication as prescribed the day of your appointment. Bring all medications with you to your appointment.  Do the following things EVERYDAY: 1) Weigh yourself in the morning before breakfast. Write it down and keep it in a log. 2) Take your medicines as prescribed 3) Eat low salt foods-Limit salt (sodium) to 2000 mg per day.  4) Stay as active as you can everyday 5) Limit all fluids for the day to less than 2 liters

## 2018-01-26 NOTE — Progress Notes (Signed)
CSW met with patient in the clinic. Patient reports she and her two daughters ages (35yo and 13yo) are currently living in [place] in [place]. Patient reports she was evicted from their apartment in [place]. Patient is hopeful to obtain permanent housing in [place] through the [place] program. Patient has disability and medicaid although has not changed addresses from [employer] to [employer] as she does not have a permanent address. CSW will assist with taxi transport until patient finds a more permanent address. Patient grateful for assistance and support. CSW provided supportive needs and will continue to be available as needed. Raquel Sarna, [place], [place]

## 2018-01-27 ENCOUNTER — Other Ambulatory Visit (HOSPITAL_COMMUNITY): Payer: Self-pay | Admitting: Student

## 2018-01-27 DIAGNOSIS — I5022 Chronic systolic (congestive) heart failure: Secondary | ICD-10-CM

## 2018-01-27 MED ORDER — TORSEMIDE 20 MG PO TABS: ORAL_TABLET | ORAL | 3 refills | Status: DC

## 2018-02-11 ENCOUNTER — Telehealth (HOSPITAL_COMMUNITY): Payer: Self-pay | Admitting: Cardiology

## 2018-02-11 ENCOUNTER — Other Ambulatory Visit (HOSPITAL_COMMUNITY): Payer: Self-pay | Admitting: *Deleted

## 2018-02-11 ENCOUNTER — Ambulatory Visit (HOSPITAL_COMMUNITY): Payer: Medicaid Other

## 2018-02-11 ENCOUNTER — Ambulatory Visit (HOSPITAL_COMMUNITY)
Admission: RE | Admit: 2018-02-11 | Discharge: 2018-02-11 | Disposition: A | Discharge status: Home / Self Care | Payer: Medicaid Other | Source: Ambulatory Visit | Attending: Internal Medicine | Admitting: Internal Medicine

## 2018-02-11 DIAGNOSIS — I5022 Chronic systolic (congestive) heart failure: Secondary | ICD-10-CM

## 2018-02-11 DIAGNOSIS — R9439 Abnormal result of other cardiovascular function study: Secondary | ICD-10-CM | POA: Insufficient documentation

## 2018-02-11 LAB — BASIC METABOLIC PANEL
Anion gap: 11 (ref 5–15)
BUN: 16 mg/dL (ref 6–20)
CALCIUM: 9.9 mg/dL (ref 8.9–10.3)
CO2: 28 mmol/L (ref 22–32)
CREATININE: 0.99 mg/dL (ref 0.44–1.00)
Chloride: 100 mmol/L — ABNORMAL LOW (ref 101–111)
GFR calc Af Amer: >60 mL/min (ref >60)
GLUCOSE: 147 mg/dL — AB (ref 65–99)
Potassium: 3.2 mmol/L — ABNORMAL LOW (ref 3.5–5.1)
SODIUM: 139 mmol/L (ref 135–145)

## 2018-02-11 NOTE — Telephone Encounter (Signed)
Notes recorded by Theresia Bough, CMA on 02/11/2018 at 2:40 PM EDT Patient aware. Patient reports she is only taking 20 BID, advised to increase to 40 BID, repeat labs x 1 week 6/6-will arrange transportation   ------  Notes recorded by Alford Highland, NP on 02/11/2018 at 12:43 PM EDT Please verify that she is taking potassium 40 BID. If she is, please have her increase to 40 TID and repeat BMET in 1 week. Thanks

## 2018-02-11 NOTE — Telephone Encounter (Signed)
-----   Message from Alford Highland, NP sent at 02/11/2018 12:43 PM EDT ----- Please verify that she is taking potassium 40 BID. If she is, please have her increase to 40 TID and repeat BMET in 1 week. Thanks

## 2018-02-19 ENCOUNTER — Ambulatory Visit (HOSPITAL_COMMUNITY)
Admission: RE | Admit: 2018-02-19 | Discharge: 2018-02-19 | Disposition: A | Discharge status: Home / Self Care | Payer: Medicaid Other | Source: Ambulatory Visit | Attending: Internal Medicine | Admitting: Internal Medicine

## 2018-02-19 DIAGNOSIS — I5022 Chronic systolic (congestive) heart failure: Secondary | ICD-10-CM | POA: Insufficient documentation

## 2018-02-19 LAB — BASIC METABOLIC PANEL
ANION GAP: 10 (ref 5–15)
BUN: 9 mg/dL (ref 6–20)
CALCIUM: 8.8 mg/dL — AB (ref 8.9–10.3)
CO2: 30 mmol/L (ref 22–32)
CREATININE: 0.94 mg/dL (ref 0.44–1.00)
Chloride: 99 mmol/L — ABNORMAL LOW (ref 101–111)
Glucose, Bld: 129 mg/dL — ABNORMAL HIGH (ref 65–99)
Potassium: 3.2 mmol/L — ABNORMAL LOW (ref 3.5–5.1)
SODIUM: 139 mmol/L (ref 135–145)

## 2018-02-20 ENCOUNTER — Telehealth (HOSPITAL_COMMUNITY): Payer: Self-pay

## 2018-02-20 NOTE — Telephone Encounter (Signed)
Result Notes for Basic metabolic panel   Notes recorded by Chyrl Civatte, RN on 02/20/2018 at 2:57 PM EDT LVM for patient to return call to clinic to discuss lab results. ------  Notes recorded by Graciella Freer, PA-C on 02/20/2018 at 7:47 AM EDT Please call today. ------  Notes recorded by Graciella Freer, PA-C on 02/19/2018 at 1:42 PM EDT Please confirm taking Potassium 40 meq BID. If is, Increase to 40 meq TID.    Casimiro Needle [address]" [place], PA-C 02/19/2018 1:42 PM

## 2018-02-24 ENCOUNTER — Telehealth (HOSPITAL_COMMUNITY): Payer: Self-pay

## 2018-02-24 NOTE — Telephone Encounter (Signed)
Result Notes for Basic metabolic panel   Notes recorded by Chyrl Civatte, RN on 02/24/2018 at 10:06 AM EDT Attempted to call patient, patient answered and phone cut out. Attempted to call back ,straight to VM. Seems like phone may have died. Will attempt to call back again later today. ------  Notes recorded by Chyrl Civatte, RN on 02/20/2018 at 2:57 PM EDT LVM for patient to return call to clinic to discuss lab results. ------  Notes recorded by Graciella Freer, PA-C on 02/20/2018 at 7:47 AM EDT Please call today. ------  Notes recorded by Graciella Freer, PA-C on 02/19/2018 at 1:42 PM EDT Please confirm taking Potassium 40 meq BID. If is, Increase to 40 meq TID.    Casimiro Needle [address] [place], PA-C 02/19/2018 1:42 PM

## 2018-02-25 ENCOUNTER — Telehealth (HOSPITAL_COMMUNITY): Payer: Self-pay

## 2018-02-25 ENCOUNTER — Encounter (HOSPITAL_COMMUNITY): Payer: Self-pay

## 2018-02-25 NOTE — Telephone Encounter (Signed)
Result Notes for Basic metabolic panel   Notes recorded by Chyrl Civatte, RN on 02/25/2018 at 3:03 PM EDT Attempted to call again, no answer, VM box full and unable to leave message. Other numbers are "wrong numbers". Multiple attempts to reach patient have been unsuccessful.  Will mail letter to contact our office regarding abnormal lab work. ------  Notes recorded by Chyrl Civatte, RN on 02/24/2018 at 10:06 AM EDT Attempted to call patient, patient answered and phone cut out. Attempted to call back ,straight to VM. Seems like phone may have died. Will attempt to call back again later today. ------  Notes recorded by Chyrl Civatte, RN on 02/20/2018 at 2:57 PM EDT LVM for patient to return call to clinic to discuss lab results. ------  Notes recorded by Graciella Freer, PA-C on 02/20/2018 at 7:47 AM EDT Please call today. ------  Notes recorded by Graciella Freer, PA-C on 02/19/2018 at 1:42 PM EDT Please confirm taking Potassium 40 meq BID. If is, Increase to 40 meq TID.    Casimiro Needle [address] [place], PA-C 02/19/2018 1:42 PM

## 2018-03-03 ENCOUNTER — Encounter (HOSPITAL_COMMUNITY): Payer: Self-pay

## 2018-03-03 ENCOUNTER — Other Ambulatory Visit (HOSPITAL_COMMUNITY): Payer: Self-pay | Admitting: Student

## 2018-03-03 ENCOUNTER — Telehealth (HOSPITAL_COMMUNITY): Payer: Self-pay

## 2018-03-03 NOTE — Telephone Encounter (Signed)
Notes recorded by Teresa Coombs, RN on 03/03/2018 at 11:04 AM EDT I have been unable to reach this patient by phone. A letter is being sent.  ------  Notes recorded by Teresa Coombs, RN on 02/27/2018 at 9:46 AM EDT Left message to call back   ------  Notes recorded by Teresa Coombs, RN on 02/20/2018 at 2:17 PM EDT Left message to call back   ------  Notes recorded by Laurey Morale, MD on 02/18/2018 at 9:23 AM EDT Submaximal but most limitation appears to be due to body habitus and lung restriction

## 2018-03-30 ENCOUNTER — Ambulatory Visit (HOSPITAL_COMMUNITY)
Admission: RE | Admit: 2018-03-30 | Discharge: 2018-03-30 | Disposition: A | Discharge status: Home / Self Care | Payer: Medicaid Other | Source: Ambulatory Visit | Attending: Cardiology | Admitting: Cardiology

## 2018-03-30 ENCOUNTER — Ambulatory Visit (HOSPITAL_BASED_OUTPATIENT_CLINIC_OR_DEPARTMENT_OTHER)
Admission: RE | Admit: 2018-03-30 | Discharge: 2018-03-30 | Disposition: A | Discharge status: Home / Self Care | Payer: Medicaid Other | Source: Ambulatory Visit | Attending: Cardiology | Admitting: Cardiology

## 2018-03-30 ENCOUNTER — Encounter (HOSPITAL_COMMUNITY): Payer: Self-pay | Admitting: Cardiology

## 2018-03-30 DIAGNOSIS — Z95810 Presence of automatic (implantable) cardiac defibrillator: Secondary | ICD-10-CM | POA: Insufficient documentation

## 2018-03-30 DIAGNOSIS — Z79899 Other long term (current) drug therapy: Secondary | ICD-10-CM | POA: Diagnosis not present

## 2018-03-30 DIAGNOSIS — I5022 Chronic systolic (congestive) heart failure: Secondary | ICD-10-CM | POA: Diagnosis not present

## 2018-03-30 DIAGNOSIS — Z8249 Family history of ischemic heart disease and other diseases of the circulatory system: Secondary | ICD-10-CM | POA: Diagnosis not present

## 2018-03-30 DIAGNOSIS — I340 Nonrheumatic mitral (valve) insufficiency: Secondary | ICD-10-CM | POA: Diagnosis not present

## 2018-03-30 DIAGNOSIS — G4733 Obstructive sleep apnea (adult) (pediatric): Secondary | ICD-10-CM | POA: Insufficient documentation

## 2018-03-30 DIAGNOSIS — Z833 Family history of diabetes mellitus: Secondary | ICD-10-CM | POA: Diagnosis not present

## 2018-03-30 DIAGNOSIS — Z6841 Body Mass Index (BMI) 40.0 and over, adult: Secondary | ICD-10-CM | POA: Diagnosis not present

## 2018-03-30 DIAGNOSIS — I428 Other cardiomyopathies: Secondary | ICD-10-CM | POA: Insufficient documentation

## 2018-03-30 LAB — BASIC METABOLIC PANEL
ANION GAP: 11 (ref 5–15)
BUN: 17 mg/dL (ref 6–20)
CALCIUM: 10.2 mg/dL (ref 8.9–10.3)
CO2: 29 mmol/L (ref 22–32)
Chloride: 98 mmol/L (ref 98–111)
Creatinine, Ser: 0.9 mg/dL (ref 0.44–1.00)
GFR calc non Af Amer: >60 mL/min (ref >60)
Glucose, Bld: 128 mg/dL — ABNORMAL HIGH (ref 70–99)
Potassium: 3.8 mmol/L (ref 3.5–5.1)
Sodium: 138 mmol/L (ref 135–145)

## 2018-03-30 LAB — DIGOXIN LEVEL: Digoxin Level: 0.6 ng/mL — ABNORMAL LOW (ref 0.8–2.0)

## 2018-03-30 MED ORDER — SACUBITRIL-VALSARTAN 97-103 MG PO TABS: 1.0000 | ORAL_TABLET | Freq: Two times a day (BID) | ORAL | 3 refills | Status: DC

## 2018-03-30 NOTE — Progress Notes (Signed)
[name], female   DOB: August 08, 1981, 37 y.o.   MRN: [id number]    Advanced Heart Failure Clinic Note   PCP: [facility] Primary HF Cardiologist: Dr Shirlee Latch   HPI: Kirsten Wheeler is a 37 y.o. 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   Admitted 7/27 - 04/22/17 with near-syncope and volume overload. Echo showed reduction in EF from previous.  Event concerning for VT so ICD placed 04/17/17. Post-op course complicated by pleuritic chest pain thought to be due to ICD microperforation of cardiac tissue. Taken for lead revision 04/21/17 with improvement.  Overall pt diuresed 16.8L though weights were inaccurate. Discharge weight 348 lbs.   Echo 04/13/17  LVEF 15-20%, Severe LV dilation, Mod LAE, Mild RAE.  CPX in 5/19 was submaximal but appeared to show severe functional impairment from HF. Echo was done today and reviewed, moderate LV dilation with EF 20-25%, RV normal size with mildly decreased systolic function, moderate central MR.    Today she returns for HF follow up. She says that as long as she is taking her meds, her breathing is ok.  She continues to live in a shelter with her 2 children.  She tries to walk for about 10 minutes at a time, usually has to stop twice to rest during 10 minutes (can walk to grocery store).  Generally, feels ok on flat ground.  No orthopnea/PND, uses CPAP. No chest pain. No lightheadedness.  Weight is down 2 lbs.    Medtronic device interrogation: Fluid index below threshold with stable impedance.  No AF/VT.  PMH:  1. Chronic systolic CHF: Nonischemic cardiomyopathy.  No family history of cardiomyopathy, no heavy ETOH, cocaine, or amphetamines.  - Echo (12/16) with EF 20-25%, moderate MR.  - Echo (5/17) with severely dilated LV, EF 10-15%, severe functional MR, mildly decreased RV systolic function. - LHC/RHC (1/19): No significant coronary disease; mean RA 15,  PA 61/25 mean 41, mean PCWP 18, CI 1.7 (Fick), PVR 5.4 WU.  - Echo (10/17): EF 20%, severely dilated LV, normal RV size with mildly decreased systolic function, moderate-severe central MR.  - Echo 04/13/17  LVEF 15-20%, Severe LV dilation, Mod LAE, Mild RAE - CPX (5/19): RER 0.95, peak VO2 9.8, VE/VCO2 26 => submaximal but likely severe functional impairement.  - Echo (7/19): EF 20-25%, moderate LV dilation, RV normal size with mildly decreased systolic function, moderate central MR.  2. Morbid obesity.  3. 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  Echo (10/17) with moderate-severe MR. Echo (7/19) with moderate central MR.  4. OSA: CPAP.   Review of systems complete and found to be negative unless listed in HPI.   SH:  Social History   Socioeconomic History  . Marital status: Single    Spouse name: Not on file  . Number of children: Not on file  . Years of education: Not on file  . Highest education level: Not on file  Occupational History  . Not on file  Social Needs  . Financial resource strain: Not on file  . Food insecurity:    Worry: Not on file    Inability: Not on file  . Transportation needs:    Medical: Not on file    Non-medical: Not on file  Tobacco Use  . Smoking status: Former Smoker    Last attempt to quit: 10/19/2015    Years since quitting: 2.4  . Smokeless tobacco: Never Used  Substance and Sexual Activity  . Alcohol use: Yes    Alcohol/week: 0.0 oz    Comment: occasionally  . Drug use: Yes    Types: Marijuana    Comment: only socially  . Sexual activity: Yes  Lifestyle  . Physical activity:    Days per week: Not on file    Minutes per session: Not on file  . Stress: Not on file  Relationships  . Social connections:    Talks on phone: Not on file    Gets together: Not on file    Attends religious service: Not on file    Active member of club or organization: Not on file    Attends meetings of clubs or organizations: Not on file     Relationship status: Not on file  . Intimate partner violence:    Fear of current or ex partner: Not on file    Emotionally abused: Not on file    Physically abused: Not on file    Forced sexual activity: Not on file  Other Topics Concern  . Not on file  Social History Narrative  . Not on file   FH:  Family History  Problem Relation Age of Onset  . Heart attack Mother   . Hyperlipidemia Father   . Diabetes Brother    Current Outpatient Medications  Medication Sig Dispense Refill  . acetaminophen (TYLENOL) 325 MG tablet Take 1-2 tablets (325-650 mg total) by mouth every 4 (four) hours as needed for mild pain.    Marland Kitchen albuterol (PROAIR HFA) 108 (90 Base) MCG/ACT inhaler Inhale 2 puffs into the lungs every 6 (six) hours as needed for wheezing or shortness of breath.    . carvedilol (COREG) 12.5 MG tablet TAKE 1 TABLET(12.5 MG) BY MOUTH TWICE DAILY WITH A MEAL 60 tablet 3  . digoxin (DIGOX) 0.125 MG tablet TAKE 1 TABLET(0.125 MG) BY MOUTH DAILY 90 tablet 3  . magnesium oxide (MAG-OX) 400 MG tablet Take 400 mg by mouth daily.    . potassium chloride SA (K-DUR,KLOR-CON) 20 MEQ tablet Take 2 tablets (40 mEq total) by mouth 2 (two) times daily. 120 tablet 3  . spironolactone (ALDACTONE) 25 MG tablet Take 1 tablet (25 mg total) by mouth daily. 90 tablet 3  . torsemide (DEMADEX) 20 MG tablet Take 80 mg by mouth daily.    . traMADol (ULTRAM) 50 MG tablet Take 1 tablet (50 mg total) by mouth every 6 (six) hours as needed for moderate pain. 28 tablet 0  . sacubitril-valsartan (ENTRESTO) 97-103 MG Take 1 tablet by mouth 2 (two) times daily. 60 tablet 3   No current facility-administered medications for this encounter.    Vitals:   03/30/18 1112  BP: 110/68  Pulse: 89  SpO2: 95%  Weight: (!) 375 lb 1.9 oz (170.2 kg)   Wt Readings from Last 3 Encounters:  03/30/18 (!) 375 lb 1.9 oz (170.2 kg)  01/26/18 (!) 377 lb 6.4 oz (171.2 kg)  12/16/17 (!) 368 lb (166.9 kg)    PHYSICAL EXAM: General:  NAD, obese.  Neck: No JVD, no thyromegaly or thyroid nodule.  Lungs: Clear to auscultation bilaterally with normal respiratory effort. CV: Nondisplaced PMI.  Heart regular S1/S2, no S3/S4, no murmur.  No peripheral edema.  No carotid bruit.  Normal pedal pulses.  Abdomen: Soft, nontender, no hepatosplenomegaly, no distention.  Skin: Intact without lesions or rashes.  Neurologic: Alert and oriented x 3.  Psych: Normal affect. Extremities: No clubbing or cyanosis.  HEENT: Normal.  ASSESSMENT & PLAN: 1.Chronic Systolic HF:  Nonischemic cardiomyopathy.  Echo 10/17 with severely dilated LV, EF 20%, moderate-severe central MR.  Echo (7/18) with EF 15-20%.  Echo (7/19) was reviewed today, EF 20-25%, moderate LV dilation, mildly decreased RV systolic function, moderate functional MR. CPX (5/19) was submaximal but concerning for severe functional impairment due to HF.  No history of heavy ETOH or cocaine.  No family history of cardiomyopathy.  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  Despite recent CPX, she seems to be doing reasonably well symptomatically.  NYHA class III. She is not volume overloaded by exam or Optivol.  - Continue torsemide 80 mg daily.  BMET today. - Increase Entresto to 97/103 bid.  BMET 10 days.  - Continue coreg 12.5 mg BID.  - Continue spironolactone 25 mg daily.  - Continue digoxin 0.125 mg daily, check level today.  - Have previously discussed the need to prevent pregnancy with HF meds.  - Narrow QRS so not candidate for CRT upgrade.  - Based on CPX, she may be nearing the need for advanced therapies.  However, her social situation (lives in shelter with 2 children, no close relatives) and obesity make LVAD a difficult proposition. Obesity precludes transplant.  2. OSA: Continue CPAP.   3.Mitral regurgitation: Moderate functional MR on today's echo.  4. Morbid Obesity: Body mass index is 62.42 kg/m.  - I will refer her to the weight loss program  with Dr. Dalbert Garnet.   Followup with HF pharmacist in 1 month and me in 2 months.   Marca Ancona, MD  03/30/2018

## 2018-03-30 NOTE — Progress Notes (Signed)
  Echocardiogram 2D Echocardiogram has been performed.  Kirsten Wheeler 03/30/2018, 10:59 AM

## 2018-03-30 NOTE — Patient Instructions (Signed)
Increase Entresto 97/103 (1 tab), twice a day  You have been referred to Dr. Dalbert Garnet for weight loss  Labs drawn today (if we do not call you, then your lab work was stable)   Your physician recommends that you return for lab work in: 10 days   Your physician recommends that you schedule a follow-up appointment in: 1 month Pharm D   Your physician recommends that you schedule a follow-up appointment in: 2 months with Dr. Shirlee Latch

## 2018-04-10 ENCOUNTER — Other Ambulatory Visit (HOSPITAL_COMMUNITY): Payer: Medicaid Other

## 2018-04-14 ENCOUNTER — Ambulatory Visit (HOSPITAL_COMMUNITY)
Admission: RE | Admit: 2018-04-14 | Discharge: 2018-04-14 | Disposition: A | Discharge status: Home / Self Care | Payer: Medicaid Other | Source: Ambulatory Visit | Attending: Cardiology | Admitting: Cardiology

## 2018-04-14 DIAGNOSIS — I5022 Chronic systolic (congestive) heart failure: Secondary | ICD-10-CM | POA: Diagnosis not present

## 2018-04-14 LAB — BASIC METABOLIC PANEL
ANION GAP: 8 (ref 5–15)
BUN: 15 mg/dL (ref 6–20)
CALCIUM: 9.4 mg/dL (ref 8.9–10.3)
CO2: 30 mmol/L (ref 22–32)
CREATININE: 0.88 mg/dL (ref 0.44–1.00)
Chloride: 99 mmol/L (ref 98–111)
GFR calc Af Amer: >60 mL/min (ref >60)
GFR calc non Af Amer: >60 mL/min (ref >60)
GLUCOSE: 147 mg/dL — AB (ref 70–99)
Potassium: 3.6 mmol/L (ref 3.5–5.1)
Sodium: 137 mmol/L (ref 135–145)

## 2018-04-30 ENCOUNTER — Ambulatory Visit (HOSPITAL_COMMUNITY)
Admission: RE | Admit: 2018-04-30 | Discharge: 2018-04-30 | Disposition: A | Discharge status: Home / Self Care | Payer: Medicaid Other | Source: Ambulatory Visit | Attending: Internal Medicine | Admitting: Internal Medicine

## 2018-04-30 DIAGNOSIS — I5022 Chronic systolic (congestive) heart failure: Secondary | ICD-10-CM | POA: Diagnosis present

## 2018-04-30 DIAGNOSIS — Z6841 Body Mass Index (BMI) 40.0 and over, adult: Secondary | ICD-10-CM | POA: Insufficient documentation

## 2018-04-30 DIAGNOSIS — I340 Nonrheumatic mitral (valve) insufficiency: Secondary | ICD-10-CM | POA: Diagnosis not present

## 2018-04-30 DIAGNOSIS — G4733 Obstructive sleep apnea (adult) (pediatric): Secondary | ICD-10-CM | POA: Diagnosis not present

## 2018-04-30 MED ORDER — POTASSIUM CHLORIDE 20 MEQ/15ML (10%) PO SOLN: 40.0000 meq | Freq: Two times a day (BID) | ORAL | 5 refills | Status: DC

## 2018-04-30 MED ORDER — CARVEDILOL 25 MG PO TABS: 25.0000 mg | ORAL_TABLET | Freq: Two times a day (BID) | ORAL | 5 refills | Status: DC

## 2018-04-30 NOTE — Progress Notes (Signed)
HF MD: [facility]  HPI:  Kirsten Wheeler is a 37 y.o. 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   Admitted 7/27 - 04/22/17 with near-syncope and volume overload. Echo showed reduction in EF from previous.  Event concerning for VT so ICD placed 04/17/17. Post-op course complicated by pleuritic chest pain thought to be due to ICD microperforation of cardiac tissue. Taken for lead revision 04/21/17 with improvement.  Overall pt diuresed 16.8L though weights were inaccurate. Discharge weight 348 lbs.   Echo 04/13/17  LVEF 15-20%, Severe LV dilation, Mod LAE, Mild RAE.  CPX in 5/19 was submaximal but appeared to show severe functional impairment from HF. Echo was done today and reviewed, moderate LV dilation with EF 20-25%, RV normal size with mildly decreased systolic function, moderate central MR.    Today she returns for pharmacist-led HF medication titration. At last HF clinic visit on 7/15, her Sherryll Burger was to be increased to 97-103 mg BID but due to transportation issues she was not able to pick it up to start it. She does admit to sometimes missing her doses of medication due to her current social situation. She is living in a shelter with her 2 children but was just approved for a house with 3 bedrooms/2 bathrooms which she will be moving into on 8/22. Generally, feels ok on flat ground.  Weight is down 6 lbs.       . Shortness of breath/dyspnea on exertion? no  . Orthopnea/PND? no . Edema? Yes - 1+ . Lightheadedness/dizziness? no . Daily weights at home? no . Blood pressure/heart rate monitoring at home? no . Following low-sodium/fluid-restricted diet? Yes - tries to eat the healthier options at the shelter  HF Medications: Carvedilol 12.5 mg PO BID Digoxin 0.125 mg PO daily MagOx 400 mg PO daily KCl 40 mEq PO BID Entresto 49-51 mg PO BID Spironolactone 25 mg PO daily Torsemide 80 mg PO daily   Has the patient  been experiencing any side effects to the medications prescribed?  no  Does the patient have any problems obtaining medications due to transportation or finances?   No- [place] Medicaid  Understanding of regimen: good Understanding of indications: good Potential of compliance: fair Patient understands to avoid NSAIDs. Patient understands to avoid decongestants.    Pertinent Lab Values: . 04/14/18: Serum creatinine 0.88, BUN 15, Potassium 3.6, Sodium 137 . 03/30/18: Digoxin 0.6   Vital Signs: . Weight: 369.6 lb (dry weight: 375 lb) . Blood pressure: 122/64 mmHg  . Heart rate: 95 bpm   Assessment: 1. Chronicsystolic CHF (EF 56-38%), due to NICM (?peripartum from 2010). NYHA class IIIsymptoms.  - Volume status stable based on symptoms and Optivol reading today  - Increase carvedilol to goal 25 mg BID  - Continue digoxin 0.125 mg daily, KCl 40 meq BID, Entresto 49-51 mg BID, spironolactone 25 mg daily and torsemide 80 mg daily   - Will attempt to switch KCl tablets to liquid since patient states that she almost vomits every time she takes a KCl tablet - Basic disease state pathophysiology, medication indication, mechanism and side effects reviewed at length with patient and he verbalized understanding 2. OSA: Continue CPAP 3.Mitral regurgitation: Moderate functional MR on today's echo 4. Morbid Obesity: Body mass index is 62.42 kg/m.  - Referred her to the weight loss program with Dr. Dalbert Garnet - Given a scale today   Plan: 1) Medication changes: Based on clinical presentation, vital  signs and recent labs will increase carvedilol to goal 25 mg BID 2) Labs: PRN 3) Follow-up: Dr. Shirlee Latch on 06/01/18   [name], PharmD, BCPS, CPP Clinical Pharmacist Phone: [phone] 04/30/2018 2:24 PM

## 2018-04-30 NOTE — Patient Instructions (Addendum)
It was great to see you today!  Please INCREASE your carvedilol to 25 mg TWICE DAILY.   Please keep your appointment with Dr. Shirlee Latch on 06/01/18.

## 2018-05-21 ENCOUNTER — Telehealth (HOSPITAL_COMMUNITY): Payer: Self-pay | Admitting: Pharmacist

## 2018-05-21 NOTE — Telephone Encounter (Signed)
Entresto PA approved by [place] Medicaid through 05/15/19.   [name], PharmD, BCPS, CPP Clinical Pharmacist Phone: [phone] 05/21/2018 11:59 AM

## 2018-05-27 ENCOUNTER — Ambulatory Visit (INDEPENDENT_AMBULATORY_CARE_PROVIDER_SITE_OTHER): Payer: Medicaid Other | Admitting: *Deleted

## 2018-05-27 DIAGNOSIS — I5022 Chronic systolic (congestive) heart failure: Secondary | ICD-10-CM

## 2018-05-27 DIAGNOSIS — I428 Other cardiomyopathies: Secondary | ICD-10-CM

## 2018-05-27 NOTE — Progress Notes (Signed)
Remote ICD transmission.   

## 2018-05-29 ENCOUNTER — Encounter: Payer: Self-pay | Admitting: Cardiology

## 2018-06-01 ENCOUNTER — Other Ambulatory Visit: Payer: Self-pay

## 2018-06-01 ENCOUNTER — Ambulatory Visit (HOSPITAL_COMMUNITY)
Admission: RE | Admit: 2018-06-01 | Discharge: 2018-06-01 | Disposition: A | Discharge status: Home / Self Care | Payer: Medicaid Other | Source: Ambulatory Visit | Attending: Cardiology | Admitting: Cardiology

## 2018-06-01 ENCOUNTER — Encounter (HOSPITAL_COMMUNITY): Payer: Self-pay | Admitting: Cardiology

## 2018-06-01 VITALS — BP 124/80 | HR 90 | Wt 374.2 lb

## 2018-06-01 DIAGNOSIS — Z79899 Other long term (current) drug therapy: Secondary | ICD-10-CM | POA: Insufficient documentation

## 2018-06-01 DIAGNOSIS — Z87891 Personal history of nicotine dependence: Secondary | ICD-10-CM | POA: Insufficient documentation

## 2018-06-01 DIAGNOSIS — I340 Nonrheumatic mitral (valve) insufficiency: Secondary | ICD-10-CM | POA: Insufficient documentation

## 2018-06-01 DIAGNOSIS — Z6841 Body Mass Index (BMI) 40.0 and over, adult: Secondary | ICD-10-CM | POA: Insufficient documentation

## 2018-06-01 DIAGNOSIS — I5022 Chronic systolic (congestive) heart failure: Secondary | ICD-10-CM | POA: Diagnosis not present

## 2018-06-01 DIAGNOSIS — G4733 Obstructive sleep apnea (adult) (pediatric): Secondary | ICD-10-CM | POA: Insufficient documentation

## 2018-06-01 LAB — BASIC METABOLIC PANEL
Anion gap: 13 (ref 5–15)
BUN: 16 mg/dL (ref 6–20)
CHLORIDE: 94 mmol/L — AB (ref 98–111)
CO2: 31 mmol/L (ref 22–32)
CREATININE: 0.99 mg/dL (ref 0.44–1.00)
Calcium: 9.5 mg/dL (ref 8.9–10.3)
GFR calc non Af Amer: >60 mL/min (ref >60)
Glucose, Bld: 125 mg/dL — ABNORMAL HIGH (ref 70–99)
Potassium: 2.9 mmol/L — ABNORMAL LOW (ref 3.5–5.1)
SODIUM: 138 mmol/L (ref 135–145)

## 2018-06-01 LAB — DIGOXIN LEVEL

## 2018-06-01 MED ORDER — SACUBITRIL-VALSARTAN 97-103 MG PO TABS: 1.0000 | ORAL_TABLET | Freq: Two times a day (BID) | ORAL | 3 refills | Status: DC

## 2018-06-01 NOTE — Progress Notes (Signed)
[name], female   DOB: 1981-08-12, 37 y.o.   MRN: [id number]    Advanced Heart Failure Clinic Note   PCP: [facility] Primary HF Cardiologist: Dr Shirlee Latch   HPI: Kirsten Wheeler is a 37 y.o. 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   Admitted 7/27 - 04/22/17 with near-syncope and volume overload. Echo showed reduction in EF from previous.  Event concerning for VT so ICD placed 04/17/17. Post-op course complicated by pleuritic chest pain thought to be due to ICD microperforation of cardiac tissue. Taken for lead revision 04/21/17 with improvement.  Overall pt diuresed 16.8L though weights were inaccurate. Discharge weight 348 lbs.   Echo 04/13/17  LVEF 15-20%, Severe LV dilation, Mod LAE, Mild RAE.  CPX in 5/19 was submaximal but appeared to show severe functional impairment from HF. Echo was in 7/19 showing moderate LV dilation with EF 20-25%, RV normal size with mildly decreased systolic function, moderate central MR.    Today, she returns for HF followup. She is taking her medications.  She just increased Entresto to 97/103 bid on Friday, no problems so far. She is no longer living in a shelter, now has an apartment.  She is mildly short of breath walking from 1st floor to 2nd floor to get to her apartment.  She is able to walk her daughter to the bus stop without problems.  No dyspnea on flat ground but short of breath with inclines.  No orthopnea/PND.  No palpitations.    Medtronic device interrogation: Fluid index below threshold with stable thoracic impedance.  No AF/VT.  PMH:  1. Chronic systolic CHF: Nonischemic cardiomyopathy.  No family history of cardiomyopathy, no heavy ETOH, cocaine, or amphetamines.  - Echo (12/16) with EF 20-25%, moderate MR.  - Echo (5/17) with severely dilated LV, EF 10-15%, severe functional MR, mildly decreased RV systolic function. - LHC/RHC (4/09): No significant  coronary disease; mean RA 15, PA 61/25 mean 41, mean PCWP 18, CI 1.7 (Fick), PVR 5.4 WU.  - Echo (10/17): EF 20%, severely dilated LV, normal RV size with mildly decreased systolic function, moderate-severe central MR.  - Echo 04/13/17  LVEF 15-20%, Severe LV dilation, Mod LAE, Mild RAE - CPX (5/19): RER 0.95, peak VO2 9.8, VE/VCO2 26 => submaximal but likely severe functional impairement.  - Echo (7/19): EF 20-25%, moderate LV dilation, RV normal size with mildly decreased systolic function, moderate central MR.  2. Morbid obesity.  3. 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  Echo (10/17) with moderate-severe MR. Echo (7/19) with moderate central MR.  4. OSA: CPAP.   Review of systems complete and found to be negative unless listed in HPI.   SH:  Social History   Socioeconomic History  . Marital status: Single    Spouse name: Not on file  . Number of children: Not on file  . Years of education: Not on file  . Highest education level: Not on file  Occupational History  . Not on file  Social Needs  . Financial resource strain: Not on file  . Food insecurity:    Worry: Not on file    Inability: Not on file  . Transportation needs:    Medical: Not on file    Non-medical: Not on file  Tobacco Use  . Smoking status: Former Smoker    Last attempt to quit: 10/19/2015    Years since quitting: 2.6  . Smokeless tobacco: Never Used  Substance  and Sexual Activity  . Alcohol use: Yes    Alcohol/week: 0.0 standard drinks    Comment: occasionally  . Drug use: Yes    Types: Marijuana    Comment: only socially  . Sexual activity: Yes  Lifestyle  . Physical activity:    Days per week: Not on file    Minutes per session: Not on file  . Stress: Not on file  Relationships  . Social connections:    Talks on phone: Not on file    Gets together: Not on file    Attends religious service: Not on file    Active member of club or organization: Not on file    Attends meetings of  clubs or organizations: Not on file    Relationship status: Not on file  . Intimate partner violence:    Fear of current or ex partner: Not on file    Emotionally abused: Not on file    Physically abused: Not on file    Forced sexual activity: Not on file  Other Topics Concern  . Not on file  Social History Narrative  . Not on file   FH:  Family History  Problem Relation Age of Onset  . Heart attack Mother   . Hyperlipidemia Father   . Diabetes Brother    Current Outpatient Medications  Medication Sig Dispense Refill  . acetaminophen (TYLENOL) 325 MG tablet Take 1-2 tablets (325-650 mg total) by mouth every 4 (four) hours as needed for mild pain.    Marland Kitchen albuterol (PROAIR HFA) 108 (90 Base) MCG/ACT inhaler Inhale 2 puffs into the lungs every 6 (six) hours as needed for wheezing or shortness of breath.    . carvedilol (COREG) 25 MG tablet Take 1 tablet (25 mg total) by mouth 2 (two) times daily with a meal. 60 tablet 5  . digoxin (DIGOX) 0.125 MG tablet TAKE 1 TABLET(0.125 MG) BY MOUTH DAILY 90 tablet 3  . magnesium oxide (MAG-OX) 400 MG tablet Take 400 mg by mouth daily.    . potassium chloride 20 MEQ/15ML (10%) SOLN Take 30 mLs (40 mEq total) by mouth 2 (two) times daily. 1800 mL 5  . spironolactone (ALDACTONE) 25 MG tablet Take 1 tablet (25 mg total) by mouth daily. 90 tablet 3  . torsemide (DEMADEX) 20 MG tablet Take 80 mg by mouth daily.    . traMADol (ULTRAM) 50 MG tablet Take 1 tablet (50 mg total) by mouth every 6 (six) hours as needed for moderate pain. 28 tablet 0  . sacubitril-valsartan (ENTRESTO) 97-103 MG Take 1 tablet by mouth 2 (two) times daily. 60 tablet 3   No current facility-administered medications for this encounter.    Vitals:   06/01/18 0955  BP: 124/80  Pulse: 90  SpO2: 97%  Weight: (!) 169.8 kg (374 lb 4 oz)   Wt Readings from Last 3 Encounters:  06/01/18 (!) 169.8 kg (374 lb 4 oz)  04/30/18 (!) 167.6 kg (369 lb 9.6 oz)  03/30/18 (!) 170.2 kg (375 lb  1.9 oz)    PHYSICAL EXAM: General: NAD, obese Neck: JVP 8 cm, no thyromegaly or thyroid nodule.  Lungs: Clear to auscultation bilaterally with normal respiratory effort. CV: Nondisplaced PMI.  Heart regular S1/S2, no S3/S4, no murmur.  Trade ankle edema.  No carotid bruit.  Normal pedal pulses.  Abdomen: Soft, nontender, no hepatosplenomegaly, no distention.  Skin: Intact without lesions or rashes.  Neurologic: Alert and oriented x 3.  Psych: Normal affect. Extremities: No clubbing  or cyanosis.  HEENT: Normal.   ASSESSMENT & PLAN: 1.Chronic Systolic HF:  Nonischemic cardiomyopathy.  Echo 10/17 with severely dilated LV, EF 20%, moderate-severe central MR.  Echo (7/18) with EF 15-20%.  Echo (7/19) showed EF 20-25%, moderate LV dilation, mildly decreased RV systolic function, moderate functional MR. CPX (5/19) was submaximal but concerning for severe functional impairment due to HF.  No history of heavy ETOH or cocaine.  No family history of cardiomyopathy.  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  Despite recent CPX, she seems to be doing reasonably well symptomatically.  NYHA class II. She is not volume overloaded by exam or Optivol.  - Continue torsemide 80 mg daily.  BMET today. Sherryll Burger just increased to 97/103 bid, BMET 10 days.  - Continue coreg 25 mg BID.  - Continue spironolactone 25 mg daily.  - Continue digoxin 0.125 mg daily, check level today.  - Next step will be addition of Bidil.  - Have previously discussed the need to prevent pregnancy with HF meds.  - Narrow QRS so not candidate for CRT upgrade.  - Based on CPX, she may be nearing the need for advanced therapies.  Obesity precludes transplant.  However, at this point, she seems to be improving some.  2. OSA: Continue CPAP.   3.Mitral regurgitation: Moderate functional MR on 7/19 echo.  4. Morbid Obesity: Body mass index is 62.28 kg/m.  - She plans to go to weight management program info  session later this month.   Followup with HF pharmacist in 2-3 wks to start Bidil.  See me in 2 months.    Marca Ancona, MD  06/01/2018

## 2018-06-01 NOTE — Patient Instructions (Signed)
Labs today (will call for abnormal results, otherwise no news is good news)  START taking Entresto 97/103 mg Twice Daily  Labs in 10 days (bmet)  Follow up in our pharmacy clinic in 3 weeks for medication titration.  Follow up in 2 months with Dr. Shirlee Latch.

## 2018-06-02 ENCOUNTER — Telehealth: Payer: Self-pay | Admitting: Licensed Clinical Social Worker

## 2018-06-02 ENCOUNTER — Telehealth (HOSPITAL_COMMUNITY): Payer: Self-pay | Admitting: Surgery

## 2018-06-02 DIAGNOSIS — Z0289 Encounter for other administrative examinations: Secondary | ICD-10-CM

## 2018-06-02 NOTE — Telephone Encounter (Signed)
I called to confirm with Kirsten Wheeler that she can attend the Sept 19th information session and Oct. 3 appt with [facility].  The HF Clinic is going to pay the $99 dollar one time enrollment fee and provide taxi transportation to ensure her attendance.  [name] advises that she will have no other barriers to attendance and understands that there will be a $50 dollar cancellation fee if she is unable to attend.   I have spoken with Crystal at the [facility] to inform her the above.

## 2018-06-02 NOTE — Telephone Encounter (Signed)
CSW received referral from clinic staff to assist patient with furniture needs as she has a new apartment. CSW contacted patient and left message for return call. Lasandra Beech, LCSW, CCSW-MCS [phone]

## 2018-06-03 ENCOUNTER — Telehealth (HOSPITAL_COMMUNITY): Payer: Self-pay | Admitting: *Deleted

## 2018-06-03 NOTE — Telephone Encounter (Signed)
Result Notes for Digoxin level   Notes recorded by Georgina Peer, RN on 06/03/2018 at 2:30 PM EDT Called patient again and she stated that she had run out of her potassium several days prior to appt. She has restarted them now and I told her to try not and miss anymore doses and will recheck at her next scheduled lab appt. ------  Notes recorded by Georgina Peer, RN on 06/02/2018 at 12:29 PM EDT Called and left message for her to call us back. Patient already setup for labs next week. ------  Notes recorded by Laurey Morale, MD on 06/02/2018 at 12:33 AM EDT She is supposed to be taking KCl 40 mEq bid. If she is doing this, increase to 60 mEq bid. BMET in 1 week.

## 2018-06-04 ENCOUNTER — Encounter (INDEPENDENT_AMBULATORY_CARE_PROVIDER_SITE_OTHER): Payer: Medicaid Other

## 2018-06-05 ENCOUNTER — Emergency Department (HOSPITAL_COMMUNITY): Payer: Medicaid Other

## 2018-06-05 ENCOUNTER — Other Ambulatory Visit: Payer: Self-pay

## 2018-06-05 ENCOUNTER — Ambulatory Visit (HOSPITAL_COMMUNITY): Payer: Medicaid Other

## 2018-06-05 ENCOUNTER — Inpatient Hospital Stay (HOSPITAL_COMMUNITY)
Admission: EM | Admit: 2018-06-05 | Discharge: 2018-06-08 | DRG: 291 | Disposition: A | Discharge status: Home / Self Care | Payer: Medicaid Other | Attending: Internal Medicine | Admitting: Internal Medicine

## 2018-06-05 DIAGNOSIS — Z8249 Family history of ischemic heart disease and other diseases of the circulatory system: Secondary | ICD-10-CM | POA: Diagnosis not present

## 2018-06-05 DIAGNOSIS — R609 Edema, unspecified: Secondary | ICD-10-CM

## 2018-06-05 DIAGNOSIS — E876 Hypokalemia: Secondary | ICD-10-CM | POA: Diagnosis not present

## 2018-06-05 DIAGNOSIS — R0902 Hypoxemia: Secondary | ICD-10-CM

## 2018-06-05 DIAGNOSIS — Z833 Family history of diabetes mellitus: Secondary | ICD-10-CM | POA: Diagnosis not present

## 2018-06-05 DIAGNOSIS — Z6841 Body Mass Index (BMI) 40.0 and over, adult: Secondary | ICD-10-CM | POA: Diagnosis not present

## 2018-06-05 DIAGNOSIS — Z87891 Personal history of nicotine dependence: Secondary | ICD-10-CM | POA: Diagnosis not present

## 2018-06-05 DIAGNOSIS — H9193 Unspecified hearing loss, bilateral: Secondary | ICD-10-CM | POA: Diagnosis present

## 2018-06-05 DIAGNOSIS — B9719 Other enterovirus as the cause of diseases classified elsewhere: Secondary | ICD-10-CM | POA: Diagnosis present

## 2018-06-05 DIAGNOSIS — R0602 Shortness of breath: Secondary | ICD-10-CM | POA: Diagnosis present

## 2018-06-05 DIAGNOSIS — Z9089 Acquired absence of other organs: Secondary | ICD-10-CM

## 2018-06-05 DIAGNOSIS — J9601 Acute respiratory failure with hypoxia: Secondary | ICD-10-CM | POA: Diagnosis not present

## 2018-06-05 DIAGNOSIS — I340 Nonrheumatic mitral (valve) insufficiency: Secondary | ICD-10-CM | POA: Diagnosis present

## 2018-06-05 DIAGNOSIS — J45909 Unspecified asthma, uncomplicated: Secondary | ICD-10-CM | POA: Diagnosis present

## 2018-06-05 DIAGNOSIS — I509 Heart failure, unspecified: Secondary | ICD-10-CM | POA: Diagnosis not present

## 2018-06-05 DIAGNOSIS — B348 Other viral infections of unspecified site: Secondary | ICD-10-CM | POA: Diagnosis present

## 2018-06-05 DIAGNOSIS — I959 Hypotension, unspecified: Secondary | ICD-10-CM | POA: Diagnosis not present

## 2018-06-05 DIAGNOSIS — Z23 Encounter for immunization: Secondary | ICD-10-CM | POA: Diagnosis not present

## 2018-06-05 DIAGNOSIS — I5043 Acute on chronic combined systolic (congestive) and diastolic (congestive) heart failure: Secondary | ICD-10-CM | POA: Diagnosis not present

## 2018-06-05 DIAGNOSIS — D72829 Elevated white blood cell count, unspecified: Secondary | ICD-10-CM | POA: Diagnosis not present

## 2018-06-05 DIAGNOSIS — Z8349 Family history of other endocrine, nutritional and metabolic diseases: Secondary | ICD-10-CM

## 2018-06-05 DIAGNOSIS — J45901 Unspecified asthma with (acute) exacerbation: Secondary | ICD-10-CM

## 2018-06-05 DIAGNOSIS — Z79891 Long term (current) use of opiate analgesic: Secondary | ICD-10-CM | POA: Diagnosis not present

## 2018-06-05 DIAGNOSIS — Z79899 Other long term (current) drug therapy: Secondary | ICD-10-CM

## 2018-06-05 DIAGNOSIS — J4541 Moderate persistent asthma with (acute) exacerbation: Secondary | ICD-10-CM | POA: Diagnosis present

## 2018-06-05 DIAGNOSIS — R0600 Dyspnea, unspecified: Secondary | ICD-10-CM

## 2018-06-05 DIAGNOSIS — Z95810 Presence of automatic (implantable) cardiac defibrillator: Secondary | ICD-10-CM | POA: Diagnosis not present

## 2018-06-05 DIAGNOSIS — R7989 Other specified abnormal findings of blood chemistry: Secondary | ICD-10-CM

## 2018-06-05 DIAGNOSIS — R062 Wheezing: Secondary | ICD-10-CM

## 2018-06-05 DIAGNOSIS — I517 Cardiomegaly: Secondary | ICD-10-CM | POA: Diagnosis present

## 2018-06-05 LAB — CBC WITH DIFFERENTIAL/PLATELET
Abs Immature Granulocytes: 0 10*3/uL (ref 0.0–0.1)
BASOS ABS: 0 10*3/uL (ref 0.0–0.1)
BASOS PCT: 0 %
Eosinophils Absolute: 0.1 10*3/uL (ref 0.0–0.7)
Eosinophils Relative: 1 %
HCT: 35.4 % — ABNORMAL LOW (ref 36.0–46.0)
Hemoglobin: 12 g/dL (ref 12.0–15.0)
Immature Granulocytes: 1 %
Lymphocytes Relative: 13 %
Lymphs Abs: 0.9 10*3/uL (ref 0.7–4.0)
MCH: 26.7 pg (ref 26.0–34.0)
MCHC: 33.9 g/dL (ref 30.0–36.0)
MCV: 78.8 fL (ref 78.0–100.0)
Monocytes Absolute: 0.4 10*3/uL (ref 0.1–1.0)
Monocytes Relative: 5 %
Neutro Abs: 5.8 10*3/uL (ref 1.7–7.7)
Neutrophils Relative %: 80 %
PLATELETS: 265 10*3/uL (ref 150–400)
RBC: 4.49 MIL/uL (ref 3.87–5.11)
RDW: 14.6 % (ref 11.5–15.5)
WBC: 7.2 10*3/uL (ref 4.0–10.5)

## 2018-06-05 LAB — TROPONIN I: Troponin I: <0.03 ng/mL (ref <0.03)

## 2018-06-05 LAB — BASIC METABOLIC PANEL
Anion gap: 12 (ref 5–15)
BUN: 15 mg/dL (ref 6–20)
CO2: 30 mmol/L (ref 22–32)
CREATININE: 0.98 mg/dL (ref 0.44–1.00)
Calcium: 8.8 mg/dL — ABNORMAL LOW (ref 8.9–10.3)
Chloride: 95 mmol/L — ABNORMAL LOW (ref 98–111)
Glucose, Bld: 152 mg/dL — ABNORMAL HIGH (ref 70–99)
Potassium: 3.1 mmol/L — ABNORMAL LOW (ref 3.5–5.1)
SODIUM: 137 mmol/L (ref 135–145)

## 2018-06-05 LAB — RESPIRATORY PANEL BY PCR
Adenovirus: NOT DETECTED
BORDETELLA PERTUSSIS-RVPCR: NOT DETECTED
Chlamydophila pneumoniae: NOT DETECTED
Coronavirus 229E: NOT DETECTED
Coronavirus HKU1: NOT DETECTED
Coronavirus NL63: NOT DETECTED
Coronavirus OC43: NOT DETECTED
INFLUENZA A-RVPPCR: NOT DETECTED
INFLUENZA B-RVPPCR: NOT DETECTED
MYCOPLASMA PNEUMONIAE-RVPPCR: NOT DETECTED
Metapneumovirus: NOT DETECTED
PARAINFLUENZA VIRUS 4-RVPPCR: NOT DETECTED
Parainfluenza Virus 1: NOT DETECTED
Parainfluenza Virus 2: NOT DETECTED
Parainfluenza Virus 3: NOT DETECTED
RHINOVIRUS / ENTEROVIRUS - RVPPCR: DETECTED — AB
Respiratory Syncytial Virus: NOT DETECTED

## 2018-06-05 LAB — I-STAT TROPONIN, ED: TROPONIN I, POC: 0.03 ng/mL (ref 0.00–0.08)

## 2018-06-05 LAB — HIV ANTIBODY (ROUTINE TESTING W REFLEX): HIV SCREEN 4TH GENERATION: NONREACTIVE

## 2018-06-05 LAB — MAGNESIUM: MAGNESIUM: 1.7 mg/dL (ref 1.7–2.4)

## 2018-06-05 LAB — D-DIMER, QUANTITATIVE: D-Dimer, Quant: 0.99 ug/mL-FEU — ABNORMAL HIGH (ref 0.00–0.50)

## 2018-06-05 LAB — BRAIN NATRIURETIC PEPTIDE: B Natriuretic Peptide: 590 pg/mL — ABNORMAL HIGH (ref 0.0–100.0)

## 2018-06-05 LAB — HCG, QUANTITATIVE, PREGNANCY

## 2018-06-05 MED ORDER — ALBUTEROL SULFATE (2.5 MG/3ML) 0.083% IN NEBU: 5.0000 mg | INHALATION_SOLUTION | Freq: Once | RESPIRATORY_TRACT | Status: DC

## 2018-06-05 MED ORDER — ALBUTEROL SULFATE (2.5 MG/3ML) 0.083% IN NEBU
5.0000 mg | INHALATION_SOLUTION | Freq: Once | RESPIRATORY_TRACT | Status: AC
  Administered 2018-06-05: 5 mg via RESPIRATORY_TRACT
  Filled 2018-06-05: qty 6

## 2018-06-05 MED ORDER — SENNOSIDES-DOCUSATE SODIUM 8.6-50 MG PO TABS
2.0000 | ORAL_TABLET | Freq: Two times a day (BID) | ORAL | Status: DC
  Administered 2018-06-05 – 2018-06-08 (×4): 2 via ORAL
  Filled 2018-06-05 (×6): qty 2

## 2018-06-05 MED ORDER — METHYLPREDNISOLONE SODIUM SUCC 125 MG IJ SOLR
60.0000 mg | Freq: Three times a day (TID) | INTRAMUSCULAR | Status: DC
  Administered 2018-06-05 – 2018-06-06 (×4): 60 mg via INTRAVENOUS
  Filled 2018-06-05 (×4): qty 2

## 2018-06-05 MED ORDER — POTASSIUM CHLORIDE CRYS ER 20 MEQ PO TBCR
40.0000 meq | EXTENDED_RELEASE_TABLET | Freq: Every day | ORAL | Status: DC
  Administered 2018-06-05 – 2018-06-08 (×4): 40 meq via ORAL
  Filled 2018-06-05 (×5): qty 2

## 2018-06-05 MED ORDER — ACETAMINOPHEN 325 MG PO TABS
650.0000 mg | ORAL_TABLET | ORAL | Status: DC | PRN
  Administered 2018-06-05: 650 mg via ORAL
  Filled 2018-06-05: qty 2

## 2018-06-05 MED ORDER — CARVEDILOL 25 MG PO TABS
25.0000 mg | ORAL_TABLET | Freq: Two times a day (BID) | ORAL | Status: DC
  Administered 2018-06-05 – 2018-06-08 (×7): 25 mg via ORAL
  Filled 2018-06-05 (×2): qty 1
  Filled 2018-06-05: qty 2
  Filled 2018-06-05 (×4): qty 1

## 2018-06-05 MED ORDER — IOPAMIDOL (ISOVUE-370) INJECTION 76%
100.0000 mL | Freq: Once | INTRAVENOUS | Status: AC | PRN
  Administered 2018-06-05: 80 mL via INTRAVENOUS

## 2018-06-05 MED ORDER — ASPIRIN EC 81 MG PO TBEC
81.0000 mg | DELAYED_RELEASE_TABLET | Freq: Every day | ORAL | Status: DC
  Administered 2018-06-05 – 2018-06-08 (×4): 81 mg via ORAL
  Filled 2018-06-05 (×5): qty 1

## 2018-06-05 MED ORDER — POTASSIUM CHLORIDE 20 MEQ/15ML (10%) PO SOLN
40.0000 meq | Freq: Every day | ORAL | Status: DC
  Administered 2018-06-05: 40 meq via ORAL
  Filled 2018-06-05: qty 30

## 2018-06-05 MED ORDER — MAGNESIUM OXIDE 400 (241.3 MG) MG PO TABS
400.0000 mg | ORAL_TABLET | Freq: Every day | ORAL | Status: DC
  Administered 2018-06-05 – 2018-06-08 (×4): 400 mg via ORAL
  Filled 2018-06-05 (×5): qty 1

## 2018-06-05 MED ORDER — IPRATROPIUM BROMIDE 0.02 % IN SOLN
0.5000 mg | Freq: Once | RESPIRATORY_TRACT | Status: AC
  Administered 2018-06-05: 0.5 mg via RESPIRATORY_TRACT
  Filled 2018-06-05: qty 2.5

## 2018-06-05 MED ORDER — SODIUM CHLORIDE 0.9% FLUSH: 3.0000 mL | INTRAVENOUS | Status: DC | PRN

## 2018-06-05 MED ORDER — DM-GUAIFENESIN ER 30-600 MG PO TB12
1.0000 | ORAL_TABLET | Freq: Two times a day (BID) | ORAL | Status: DC | PRN
  Administered 2018-06-05 – 2018-06-07 (×3): 1 via ORAL
  Filled 2018-06-05 (×3): qty 1

## 2018-06-05 MED ORDER — FUROSEMIDE 10 MG/ML IJ SOLN
80.0000 mg | Freq: Two times a day (BID) | INTRAMUSCULAR | Status: DC
  Administered 2018-06-05 – 2018-06-06 (×3): 80 mg via INTRAVENOUS
  Filled 2018-06-05 (×3): qty 8

## 2018-06-05 MED ORDER — POTASSIUM CHLORIDE CRYS ER 20 MEQ PO TBCR
40.0000 meq | EXTENDED_RELEASE_TABLET | Freq: Once | ORAL | Status: AC
  Administered 2018-06-05: 40 meq via ORAL
  Filled 2018-06-05: qty 2

## 2018-06-05 MED ORDER — ALBUTEROL SULFATE (2.5 MG/3ML) 0.083% IN NEBU
5.0000 mg | INHALATION_SOLUTION | RESPIRATORY_TRACT | Status: DC | PRN
  Administered 2018-06-06 – 2018-06-08 (×2): 5 mg via RESPIRATORY_TRACT
  Filled 2018-06-05 (×2): qty 6

## 2018-06-05 MED ORDER — IPRATROPIUM-ALBUTEROL 0.5-2.5 (3) MG/3ML IN SOLN
3.0000 mL | Freq: Three times a day (TID) | RESPIRATORY_TRACT | Status: DC
  Administered 2018-06-06 – 2018-06-08 (×7): 3 mL via RESPIRATORY_TRACT
  Filled 2018-06-05 (×7): qty 3

## 2018-06-05 MED ORDER — IPRATROPIUM-ALBUTEROL 0.5-2.5 (3) MG/3ML IN SOLN
3.0000 mL | RESPIRATORY_TRACT | Status: DC
  Administered 2018-06-05 (×4): 3 mL via RESPIRATORY_TRACT
  Filled 2018-06-05 (×4): qty 3

## 2018-06-05 MED ORDER — FUROSEMIDE 10 MG/ML IJ SOLN
80.0000 mg | Freq: Once | INTRAMUSCULAR | Status: AC
  Administered 2018-06-05: 80 mg via INTRAVENOUS
  Filled 2018-06-05: qty 8

## 2018-06-05 MED ORDER — SODIUM CHLORIDE 0.9 % IV SOLN: 250.0000 mL | INTRAVENOUS | Status: DC | PRN

## 2018-06-05 MED ORDER — TRAMADOL HCL 50 MG PO TABS
100.0000 mg | ORAL_TABLET | Freq: Four times a day (QID) | ORAL | Status: DC | PRN
  Administered 2018-06-05: 100 mg via ORAL
  Filled 2018-06-05: qty 2

## 2018-06-05 MED ORDER — INFLUENZA VAC SPLIT QUAD 0.5 ML IM SUSY
0.5000 mL | PREFILLED_SYRINGE | INTRAMUSCULAR | Status: AC
  Administered 2018-06-06: 0.5 mL via INTRAMUSCULAR
  Filled 2018-06-05: qty 0.5

## 2018-06-05 MED ORDER — ENOXAPARIN SODIUM 80 MG/0.8ML ~~LOC~~ SOLN
80.0000 mg | SUBCUTANEOUS | Status: DC
  Administered 2018-06-05 – 2018-06-06 (×2): 80 mg via SUBCUTANEOUS
  Filled 2018-06-05 (×2): qty 0.8

## 2018-06-05 MED ORDER — DIGOXIN 125 MCG PO TABS
0.1250 mg | ORAL_TABLET | Freq: Every day | ORAL | Status: DC
  Administered 2018-06-05 – 2018-06-08 (×4): 0.125 mg via ORAL
  Filled 2018-06-05 (×5): qty 1

## 2018-06-05 MED ORDER — ZOLPIDEM TARTRATE 5 MG PO TABS: 5.0000 mg | ORAL_TABLET | Freq: Every evening | ORAL | Status: DC | PRN

## 2018-06-05 MED ORDER — SODIUM CHLORIDE 0.9% FLUSH
3.0000 mL | Freq: Two times a day (BID) | INTRAVENOUS | Status: DC
  Administered 2018-06-05 – 2018-06-08 (×7): 3 mL via INTRAVENOUS

## 2018-06-05 MED ORDER — IOPAMIDOL (ISOVUE-370) INJECTION 76%
INTRAVENOUS | Status: AC
  Filled 2018-06-05: qty 100

## 2018-06-05 MED ORDER — ONDANSETRON HCL 4 MG/2ML IJ SOLN: 4.0000 mg | Freq: Four times a day (QID) | INTRAMUSCULAR | Status: DC | PRN

## 2018-06-05 MED ORDER — SACUBITRIL-VALSARTAN 97-103 MG PO TABS
1.0000 | ORAL_TABLET | Freq: Two times a day (BID) | ORAL | Status: DC
  Administered 2018-06-05 – 2018-06-08 (×7): 1 via ORAL
  Filled 2018-06-05 (×8): qty 1

## 2018-06-05 MED ORDER — ALPRAZOLAM 0.25 MG PO TABS: 0.2500 mg | ORAL_TABLET | Freq: Two times a day (BID) | ORAL | Status: DC | PRN

## 2018-06-05 NOTE — ED Provider Notes (Deleted)
Morbidly obese 37 year old female with history of CHF with implanted defibrillator comes in with dyspnea and cough.  On exam, she has considerable wheezing with no rales heard.  She does have 2+ peripheral edema.  Chest x-ray appears to show interstitial edema.  BNP is moderately elevated.  She has improved somewhat with albuterol with ipratropium as well as furosemide.  She likely will need to be admitted to optimize her respiratory status.   Dione Booze, MD 06/05/18 [phone]

## 2018-06-05 NOTE — H&P (Signed)
History and Physical    Kirsten Wheeler ZOX:[id number] DOB: 04-Jul-1981 DOA: 06/05/2018  Referring MD/NP/PA:   PCP: Eunice Blase, PA-C   Patient coming from:  The patient is coming from home.  At baseline, pt is independent for most of ADL.  Chief Complaint: Shortness of breath, cough, wheezing,  HPI: Kirsten Wheeler is a 37 y.o. female with medical history significant of CHF with EF of 20%, asthma, morbid obesity, s/p of ICD placement, who presents with shortness of breath, cough and wheezing.  Patient states that she has been having shortness of breath in the past 2 days, which has been progressively getting worse.  She states that she has cough with greenish colored sputum production.  She also has wheezing and bilateral leg edema.  She states that she does not have chest pain at the rest, but coughing induced some chest pain.  Patient denies nausea, vomiting, diarrhea, abdominal pain, symptoms of UTI or unilateral weakness.  She can barely speaking full sentence.  She states that she ran out of her inhalers.  ED Course: pt was found to have negative troponin, WBC 7.2, BNP 590, potassium 3.1, creatinine normal, positive d-dimer 0.99, temperature normal, no tachycardia, has tachypnea, oxygen saturation 89% on room air.  Chest x-ray showed cardiomegaly and mild interstitial pulmonary edema.  CT angiogram is negative for PE.  Patient is admitted to telemetry bed as inpatient.  Review of Systems:   General: no fevers, chills, has fatigue HEENT: no blurry vision, hearing changes or sore throat Respiratory: has dyspnea, coughing, wheezing CV: no chest pain, no palpitations GI: no nausea, vomiting, abdominal pain, diarrhea, constipation GU: no dysuria, burning on urination, increased urinary frequency, hematuria  Ext: has leg edema Neuro: no unilateral weakness, numbness, or tingling, no vision change or hearing loss Skin: no rash, no skin tear. MSK: No muscle spasm, no deformity, no limitation  of range of movement in spin Heme: No easy bruising.  Travel history: No recent long distant travel.  Allergy: No Known Allergies  Past Medical History:  Diagnosis Date  . Asthma   . Chronic systolic CHF (congestive heart failure) (HCC)    a. ECHO 01/15/2016 EF 10-15%. 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  Marland Kitchen Hearing loss    bilateral  . Mitral regurgitation    a. severe, felt to be functional 2/2 LV dilation   . Morbid obesity (HCC)   . Snoring     Past Surgical History:  Procedure Laterality Date  . CARDIAC CATHETERIZATION N/A 01/18/2016   Procedure: Right/Left Heart Cath and Coronary Angiography;  Surgeon: Laurey Morale, MD;  Location: [facility] INVASIVE CV LAB;  Service: Cardiovascular;  Laterality: N/A;  . CESAREAN SECTION    . ICD IMPLANT N/A 04/17/2017   Procedure: ICD Implant;  Surgeon: Regan Lemming, MD;  Location: [facility] INVASIVE CV LAB;  Service: Cardiovascular;  Laterality: N/A;  . LEAD REVISION/REPAIR N/A 04/21/2017   Procedure: Lead Revision/Repair;  Surgeon: Regan Lemming, MD;  Location: [facility] INVASIVE CV LAB;  Service: Cardiovascular;  Laterality: N/A;  . TONSILLECTOMY      Social History:  reports that she quit smoking about 2 years ago. She has never used smokeless tobacco. She reports that she drinks alcohol. She reports that she has current or past drug history. Drug: Marijuana.  Family History:  Family History  Problem Relation Age of Onset  . Heart attack Mother   . Hyperlipidemia Father   . Diabetes Brother      Prior  to Admission medications   Medication Sig Start Date End Date Taking? Authorizing Provider  acetaminophen (TYLENOL) 325 MG tablet Take 1-2 tablets (325-650 mg total) by mouth every 4 (four) hours as needed for mild pain. 04/22/17  Yes Graciella Freer, PA-C  albuterol [facility] HFA) 108 (90 Base) MCG/ACT inhaler Inhale 2 puffs into the lungs every 6 (six) hours as needed for wheezing or shortness of breath.   Yes [provider]  carvedilol (COREG) 25 MG tablet Take 1 tablet (25 mg total) by mouth 2 (two) times daily with a meal. 04/30/18  Yes Laurey Morale, MD  digoxin (DIGOX) 0.125 MG tablet TAKE 1 TABLET(0.125 MG) BY MOUTH DAILY Patient taking differently: Take 0.125 mg by mouth daily.  01/26/18  Yes Alford Highland, NP  magnesium oxide (MAG-OX) 400 MG tablet Take 400 mg by mouth daily.   Yes [provider]  potassium chloride 20 MEQ/15ML (10%) SOLN Take 30 mLs (40 mEq total) by mouth 2 (two) times daily. 04/30/18  Yes Laurey Morale, MD  sacubitril-valsartan (ENTRESTO) 97-103 MG Take 1 tablet by mouth 2 (two) times daily. 06/01/18  Yes Laurey Morale, MD  spironolactone (ALDACTONE) 25 MG tablet Take 1 tablet (25 mg total) by mouth daily. 01/26/18  Yes Alford Highland, NP  torsemide (DEMADEX) 20 MG tablet Take 40-80 mg by mouth See admin instructions. Take 80 mg every morning then take 40 mg every evening   Yes [provider]  traMADol (ULTRAM) 50 MG tablet Take 1 tablet (50 mg total) by mouth every 6 (six) hours as needed for moderate pain. Patient not taking: Reported on 06/05/2018 04/22/17   Graciella Freer, PA-C    Physical Exam: Vitals:   06/05/18 0341 06/05/18 0500 06/05/18 0530 06/05/18 0531  BP: 93/68 115/66 95/72 95/72   Pulse: 80 86 79 76  Resp: 13 (!) 24  15  Temp:      TempSrc:      SpO2: 95% (!) 87% 93% 96%  Weight:      Height:       General: Not in acute distress HEENT:       Eyes: PERRL, EOMI, no scleral icterus.       ENT: No discharge from the ears and nose, no pharynx injection, no tonsillar enlargement.        Neck: Difficult to assess JVD due to morbid obesity, no bruit, no mass felt. Heme: No neck lymph node enlargement. Cardiac: S1/S2, RRR, No murmurs, No gallops or rubs. Respiratory: Has wheezing bilaterally.   GI: Soft, nondistended, nontender, no rebound pain, no organomegaly, BS present. GU: No hematuria Ext: has 1+ pitting leg edema bilaterally.  2+DP/PT pulse bilaterally. Musculoskeletal: No joint deformities, No joint redness or warmth, no limitation of ROM in spin. Skin: No rashes.  Neuro: Alert, oriented X3, cranial nerves II-XII grossly intact, moves all extremities normally. Marland Kitchen Psych: Patient is not psychotic, no suicidal or hemocidal ideation.  Labs on Admission: I have personally reviewed following labs and imaging studies  CBC: Recent Labs  Lab 06/05/18 0146  WBC 7.2  NEUTROABS 5.8  HGB 12.0  HCT 35.4*  MCV 78.8  PLT 265   Basic Metabolic Panel: Recent Labs  Lab 06/01/18 1035 06/05/18 0146  NA 138 137  K 2.9* 3.1*  CL 94* 95*  CO2 31 30  GLUCOSE 125* 152*  BUN 16 15  CREATININE 0.99 0.98  CALCIUM 9.5 8.8*   GFR: Estimated Creatinine Clearance: 128.1 mL/min (by  C-G formula based on SCr of 0.98 mg/dL). Liver Function Tests: No results for input(s): AST, ALT, ALKPHOS, BILITOT, PROT, ALBUMIN in the last 168 hours. No results for input(s): LIPASE, AMYLASE in the last 168 hours. No results for input(s): AMMONIA in the last 168 hours. Coagulation Profile: No results for input(s): INR, PROTIME in the last 168 hours. Cardiac Enzymes: No results for input(s): CKTOTAL, CKMB, CKMBINDEX, TROPONINI in the last 168 hours. BNP (last 3 results) No results for input(s): PROBNP in the last 8760 hours. HbA1C: No results for input(s): HGBA1C in the last 72 hours. CBG: No results for input(s): GLUCAP in the last 168 hours. Lipid Profile: No results for input(s): CHOL, HDL, LDLCALC, TRIG, CHOLHDL, LDLDIRECT in the last 72 hours. Thyroid Function Tests: No results for input(s): TSH, T4TOTAL, FREET4, T3FREE, THYROIDAB in the last 72 hours. Anemia Panel: No results for input(s): VITAMINB12, FOLATE, FERRITIN, TIBC, IRON, RETICCTPCT in the last 72 hours. Urine analysis:    Component Value Date/Time   COLORURINE YELLOW 01/14/2016 1250   APPEARANCEUR CLEAR 01/14/2016 1250   LABSPEC 1.012 01/14/2016 1250   PHURINE 6.0  01/14/2016 1250   GLUCOSEU NEGATIVE 01/14/2016 1250   HGBUR NEGATIVE 01/14/2016 1250   BILIRUBINUR NEGATIVE 01/14/2016 1250   KETONESUR NEGATIVE 01/14/2016 1250   PROTEINUR NEGATIVE 01/14/2016 1250   NITRITE NEGATIVE 01/14/2016 1250   LEUKOCYTESUR NEGATIVE 01/14/2016 1250   Sepsis Labs: @LABRCNTIP (procalcitonin:4,lacticidven:4) )No results found for this or any previous visit (from the past 240 hour(s)).   Radiological Exams on Admission: Dg Chest 2 View  Result Date: 06/05/2018 CLINICAL DATA:  Dyspnea EXAM: CHEST - 2 VIEW COMPARISON:  04/22/2017 FINDINGS: Unchanged cardiomegaly. ICD device projects over the left hemithorax with lead in the right ventricle as before. Diffuse mild interstitial edema is noted. No effusion or pneumothorax. No acute osseous abnormality. IMPRESSION: Cardiomegaly with mild interstitial pulmonary edema. Electronically Signed   By: Tollie Eth M.D.   On: 06/05/2018 01:35   Ct Angio Chest Pe W And/or Wo Contrast  Result Date: 06/05/2018 CLINICAL DATA:  Shortness of breath for 2 days. Worse today. Positive D-dimer. EXAM: CT ANGIOGRAPHY CHEST WITH CONTRAST TECHNIQUE: Multidetector CT imaging of the chest was performed using the standard protocol during bolus administration of intravenous contrast. Multiplanar CT image reconstructions and MIPs were obtained to evaluate the vascular anatomy. CONTRAST:  80mL ISOVUE-370 IOPAMIDOL (ISOVUE-370) INJECTION 76% COMPARISON:  01/13/2006 FINDINGS: Cardiovascular: There is moderately good opacification of the central and segmental pulmonary arteries. No evidence of significant pulmonary embolus. Pulmonary arteries in the lung bases are not opacified due to bolus timing. Cardiac enlargement. No pericardial effusion. Normal caliber of the thoracic aorta. Cardiac pacemaker. Mediastinum/Nodes: Increased density in the anterior mediastinum similar to previous study, likely representing prominent thymic tissue. No significant lymphadenopathy.  Esophagus is decompressed. Lungs/Pleura: Motion artifact limits examination. Mild diffuse mosaic attenuation pattern to the lungs may represent motion artifact, edema, air trapping, or airways disease Upper Abdomen: Probable diffuse fatty infiltration of the liver. Musculoskeletal: No chest wall abnormality. No acute or significant osseous findings. Review of the MIP images confirms the above findings. IMPRESSION: No evidence of significant pulmonary embolus. Cardiac enlargement. Mosaic attenuation pattern to the lungs may represent motion artifact, edema, or airways disease. Electronically Signed   By: Burman Nieves M.D.   On: 06/05/2018 05:19     EKG: Independently reviewed.  Sinus rhythm, QTC 442, mild ST depression in inferior leads and in V4-V6, LAD.  Assessment/Plan Principal Problem:   Acute on chronic  combined systolic and diastolic CHF (congestive heart failure) (HCC) Active Problems:   Morbid obesity- BMI 57   Asthma exacerbation   Acute respiratory failure with hypoxia (HCC)   Hypokalemia  Acute respiratory failure with hypoxia: Likely due to combination of CHF and asthma exacerbation.  Patient has bilateral leg edema, elevated BNP, pulmonary edema on chest x-ray, consistent with CHF exacerbation.  Patient has cough with wheezing on auscultation, consistent with asthma exacerbation. No PE by CTA.  - Admitted to telemetry bed as inpatient - Treat CHF and asthma exacerbation as below - Nasal cannula oxygen to maintain oxygen saturation above 92%  Acute on chronic combined systolic and diastolic CHF (congestive heart failure) (HCC): 2D echo on 03/20/2018 showed EF 20-25% with grade 2 diastolic dysfunction, now has CHF exacerbation. -Lasix 80 mg bid by IV -trop x 3 -Daily weights -strict I/O's -Low salt diet -LE doppler to r/o DVT due to positive D-dimier -continue digoxin and check digoxin level  Asthma exacerbation:  -Nebulizers: scheduled Duoneb and prn  albuterol -Solu-Medrol 60 mg IV tid -Mucinex for cough  -respiratory virus panel -Nasal cannula oxygen as needed to maintain O2 saturation 92% or greater  Hypokalemia: K=3.1 on admission. - Repleted - Check Mg level  Morbid obesity:  -Diet and exercise.   -Encouraged to lose weight.     Inpatient status:  # Patient requires inpatient status due to high intensity of service, high risk for further deterioration and high frequency of surveillance required.  I certify that at the point of admission it is my clinical judgment that the patient will require inpatient hospital care spanning beyond 2 midnights from the point of admission.  . This patient has multiple chronic comorbidities includingCHF with EF of 20%, asthma, morbid obesity, s/p of ICD placement. . Now patient has presenting symptoms include shortness of breath, cough and wheezing. . The worrisome physical exam findings include bilateral leg edema, wheezing on auscultation . The initial radiographic and laboratory data are worrisome because of elevated BNP, cardiomegaly and interstitial pulmonary edema by CXR. . Current medical needs: please see my assessment and plan    DVT ppx:  SQ Lovenox Code Status: Full code Family Communication: None at bed side. Disposition Plan:  Anticipate discharge back to previous home environment Consults called:  none Admission status:   Inpatient/tele      Date of Service 06/05/2018    Lorretta Harp [facility] Pager [phone]  If 7PM-7AM, please contact night-coverage [email or web address] Password [facility] 06/05/2018, 6:49 AM

## 2018-06-05 NOTE — Progress Notes (Signed)
*  Preliminary Results* Bilateral lower extremity venous duplex completed. There is no obvious evidence of acute deep vein thrombosis involving the visualized veins of bilateral lower extremities. There is no evidence of Baker's cyst bilaterally.  06/05/2018 4:11 PM Gertie Fey, MHA, RVT, RDCS, RDMS

## 2018-06-05 NOTE — Progress Notes (Signed)
Kirsten Wheeler is a 37 y.o. female with medical history significant of CHF with EF of 20%, asthma, morbid obesity, s/p of ICD placement, who presents with shortness of breath, cough and wheezing. She reports she ran out of inhalers. CXR showed cardiomegaly and mild interstitial pulmonary edema.  CT angiogram is negative for PE.  She was admitted for acute respiratory failure with hypoxia secondary to acute asthma exacerbation and acute systolic heart failure.    On exam: Pt is alert not in distress.  CVS s1s2,  Lungs bilateral expiratory wheezing, air entry diminished at bases, scattered rales.  Abdomen: soft NT ND BS+ Extremities; Pedal edema present.    Plan: 1. Continue with solumedrol, duonebs for asthma exacerbation.  2. Continue with IV lasix and resume his home meds.  3. Check BMP tomorrow.    Kathlen Mody. MD [phone]

## 2018-06-05 NOTE — ED Notes (Signed)
RN attempted x 2 to call report to floor; RN to call back-Monique,RN

## 2018-06-05 NOTE — ED Notes (Signed)
Patient returned from CT

## 2018-06-05 NOTE — Care Management Note (Signed)
Case Management Note  Patient Details  Name: Kirsten Wheeler MRN: [id number] Date of Birth: Jun 21, 1981  Subjective/Objective:     CHF              Action/Plan: Patient is independent of all of her ADL's, lives at home with her 2 daughters ages 72 and 35 yrs old; PCP: O'Buch, Greta, PA-C; has private insurance with Medicaid with prescription drug coverage; pharmacy of choice is [facility]; no DME; patient has scales at home but does not weigh herself daily; CM encouraged pt to weigh daily and record the weight; she states that she does not use salt. Exercise of choice is walking; CM will continue to follow for progression of care.  Expected Discharge Date:   possibly 06/09/2018               Expected Discharge Plan:  Home/Self Care  Discharge planning Services  CM Consult  Status of Service:  In process, will continue to follow  Reola Mosher [phone] 06/05/2018, 3:56 PM

## 2018-06-05 NOTE — ED Notes (Signed)
ED Provider at bedside. 

## 2018-06-05 NOTE — ED Notes (Signed)
Dr.NIU at bedside. 

## 2018-06-05 NOTE — ED Notes (Signed)
Patient transported to X-ray 

## 2018-06-05 NOTE — Progress Notes (Signed)
Patient is asking for additional pain medication. Tylenol not working . Paged MD Blake Divine and informed her.  Jami Ohlin, RN

## 2018-06-05 NOTE — ED Triage Notes (Signed)
Pt BIB [facility] for shortness of breath that started approx 2 days ago and got worse today. Pt usually uses her inhaler but recently ran out. EMS administered 125mg  solumedrol, 5mg  albuterol, 0.5 atrovent, and 4mg  of zofran IV

## 2018-06-05 NOTE — ED Provider Notes (Signed)
[facility] EMERGENCY DEPARTMENT Provider Note   CSN: [id number] Arrival date & time: 06/05/18  2536     History   Chief Complaint Chief Complaint  Patient presents with  . Shortness of Breath    HPI Kirsten Wheeler is a 37 y.o. female with a hx of asthma, Chronic systolic CHF, mitral regurg, obesity presents to the Emergency Department complaining of gradual, persistent, progressively worsening SOB onset 3 days ago but acutely worsening tonight.  Pt reports increasing DOE over the last several days and basically hasn't gotten out of bed.  She reports increased swelling of her bilateral legs L>R.  She denies previous DVT/PE.  She reports taking lasix 80mg  in the AM and 40mg  at night as prescribed but feels her urination volume is decreased.  Pt reports increased wheezing without improvement after using albuterol nebulizer without improvement.  Nothing seems to make symptoms better.  Pt denies fever, chills, headache, neck pain, abd pain, N/V/D, syncope, dysuria.  Pt is followed by Dr. Shirlee Latch.      Record review shows Pt had follow-up with Dr. Shirlee Latch on 06/01/18.  She is currently NYHA Class II. Hx of ICD placement on 04/17/17 after episode of near syncope, decreased EF and possible VT.  At that visit she reported ability to walk up 1 flight of stairs with mild SOB and no SOB with walking her daughter to the bus stop.  Pt reports she is currently unable to do any of this.  She recently increased her Entresto, but no other changes to her medications.    Pt given 125mg  solumedol, 5mg  albuterol, 0.5mg  atrovent and zofran by EMS PTA.     The history is provided by the patient and medical records. No language interpreter was used.    Past Medical History:  Diagnosis Date  . Asthma   . Chronic systolic CHF (congestive heart failure) (HCC)    a. ECHO 01/15/2016 EF 10-15%. 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  Marland Kitchen Hearing loss    bilateral  . Mitral regurgitation    a. severe,  felt to be functional 2/2 LV dilation   . Morbid obesity (HCC)   . Snoring     Patient Active Problem List   Diagnosis Date Noted  . Asthma exacerbation 06/05/2018  . Acute respiratory failure with hypoxia (HCC) 06/05/2018  . Hypokalemia 06/05/2018  . Snoring 09/23/2016  . Cough 04/10/2016  . 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 Tobacco abuse 01/25/2016  . Acute on chronic combined systolic and diastolic CHF (congestive heart failure) (HCC) 01/25/2016  . Mitral regurgitation   . Abnormal EKG-inf TWI 01/15/2016  . Positive D dimer-CTA neg for PE 01/15/2016  . Morbid obesity- BMI 57 01/14/2016  . Abdominal pain 01/14/2016  . 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 At risk for sleep apnea 01/14/2016    Past Surgical History:  Procedure Laterality Date  . CARDIAC CATHETERIZATION N/A 01/18/2016   Procedure: Right/Left Heart Cath and Coronary Angiography;  Surgeon: Laurey Morale, MD;  Location: [facility] INVASIVE CV LAB;  Service: Cardiovascular;  Laterality: N/A;  . CESAREAN SECTION    . ICD IMPLANT N/A 04/17/2017   Procedure: ICD Implant;  Surgeon: Regan Lemming, MD;  Location: [facility] INVASIVE CV LAB;  Service: Cardiovascular;  Laterality: N/A;  . LEAD REVISION/REPAIR N/A 04/21/2017   Procedure: Lead Revision/Repair;  Surgeon: Regan Lemming, MD;  Location: [facility] INVASIVE CV LAB;  Service: Cardiovascular;  Laterality: N/A;  . TONSILLECTOMY  OB History   None      Home Medications    Prior to Admission medications   Medication Sig Start Date End Date Taking? Authorizing Provider  acetaminophen (TYLENOL) 325 MG tablet Take 1-2 tablets (325-650 mg total) by mouth every 4 (four) hours as needed for mild pain. 04/22/17  Yes Graciella Freer, PA-C  albuterol [facility] HFA) 108 (90 Base) MCG/ACT inhaler Inhale 2 puffs into the lungs every 6 (six) hours as needed for wheezing or shortness of breath.   Yes [provider]  carvedilol (COREG) 25 MG  tablet Take 1 tablet (25 mg total) by mouth 2 (two) times daily with a meal. 04/30/18  Yes Laurey Morale, MD  digoxin (DIGOX) 0.125 MG tablet TAKE 1 TABLET(0.125 MG) BY MOUTH DAILY Patient taking differently: Take 0.125 mg by mouth daily.  01/26/18  Yes Alford Highland, NP  magnesium oxide (MAG-OX) 400 MG tablet Take 400 mg by mouth daily.   Yes [provider]  potassium chloride 20 MEQ/15ML (10%) SOLN Take 30 mLs (40 mEq total) by mouth 2 (two) times daily. 04/30/18  Yes Laurey Morale, MD  sacubitril-valsartan (ENTRESTO) 97-103 MG Take 1 tablet by mouth 2 (two) times daily. 06/01/18  Yes Laurey Morale, MD  spironolactone (ALDACTONE) 25 MG tablet Take 1 tablet (25 mg total) by mouth daily. 01/26/18  Yes Alford Highland, NP  torsemide (DEMADEX) 20 MG tablet Take 40-80 mg by mouth See admin instructions. Take 80 mg every morning then take 40 mg every evening   Yes [provider]  traMADol (ULTRAM) 50 MG tablet Take 1 tablet (50 mg total) by mouth every 6 (six) hours as needed for moderate pain. Patient not taking: Reported on 06/05/2018 04/22/17   Graciella Freer, PA-C    Family History Family History  Problem Relation Age of Onset  . Heart attack Mother   . Hyperlipidemia Father   . Diabetes Brother     Social History Social History   Tobacco Use  . Smoking status: Former Smoker    Last attempt to quit: 10/19/2015    Years since quitting: 2.6  . Smokeless tobacco: Never Used  Substance Use Topics  . Alcohol use: Yes    Alcohol/week: 0.0 standard drinks    Comment: occasionally  . Drug use: Yes    Types: Marijuana    Comment: only socially     Allergies   Patient has no known allergies.   Review of Systems Review of Systems  Constitutional: Positive for fatigue. Negative for appetite change, diaphoresis, fever and unexpected weight change.  HENT: Negative for mouth sores.   Eyes: Negative for visual disturbance.  Respiratory: Positive for cough,  shortness of breath and wheezing. Negative for chest tightness.        Dyspnea on exertion  Cardiovascular: Positive for palpitations and leg swelling. Negative for chest pain.  Gastrointestinal: Positive for abdominal distention. Negative for abdominal pain, constipation, diarrhea, nausea and vomiting.  Endocrine: Negative for polydipsia, polyphagia and polyuria.  Genitourinary: Negative for dysuria, frequency, hematuria and urgency.  Musculoskeletal: Negative for back pain and neck stiffness.  Skin: Negative for rash.  Allergic/Immunologic: Negative for immunocompromised state.  Neurological: Negative for syncope, light-headedness and headaches.  Hematological: Does not bruise/bleed easily.  Psychiatric/Behavioral: Negative for sleep disturbance. The patient is not nervous/anxious.      Physical Exam Updated Vital Signs BP 99/75   Pulse 90   Temp 97.9 F (36.6 C) (Oral)   Resp Marland Kitchen)  22   Ht 5\' 5"  (1.651 m)   Wt (!) 167.8 kg   SpO2 (!) 89%   BMI 61.57 kg/m   Physical Exam  Constitutional: She appears well-developed and well-nourished. She appears distressed.  Awake, alert, nontoxic appearance  HENT:  Head: Normocephalic and atraumatic.  Mouth/Throat: Oropharynx is clear and moist. No oropharyngeal exudate.  Eyes: Conjunctivae are normal. No scleral icterus.  Neck: Normal range of motion. Neck supple.  Cardiovascular: Normal rate, regular rhythm and intact distal pulses.  Pulmonary/Chest: Accessory muscle usage present. Tachypnea noted. She is in respiratory distress. She has decreased breath sounds ( significant in the bases). She has wheezes ( inspiratory and expiratory throughout).  Equal chest expansion  Abdominal: Soft. Bowel sounds are normal. She exhibits no mass. There is no tenderness. There is no rebound and no guarding.  Musculoskeletal: Normal range of motion.       Right lower leg: She exhibits edema (bilateral 1+).  Neurological: She is alert.  Speech is clear  and goal oriented Moves extremities without ataxia  Skin: Skin is warm and dry. She is not diaphoretic.  Psychiatric: She has a normal mood and affect.  Nursing note and vitals reviewed.    ED Treatments / Results  Labs (all labs ordered are listed, but only abnormal results are displayed) Labs Reviewed  CBC WITH DIFFERENTIAL/PLATELET - Abnormal; Notable for the following components:      Result Value   HCT 35.4 (*)    All other components within normal limits  BASIC METABOLIC PANEL - Abnormal; Notable for the following components:   Potassium 3.1 (*)    Chloride 95 (*)    Glucose, Bld 152 (*)    Calcium 8.8 (*)    All other components within normal limits  BRAIN NATRIURETIC PEPTIDE - Abnormal; Notable for the following components:   B Natriuretic Peptide 590.0 (*)    All other components within normal limits  D-DIMER, QUANTITATIVE (NOT AT [facility]) - Abnormal; Notable for the following components:   D-Dimer, Quant 0.99 (*)    All other components within normal limits  RESPIRATORY PANEL BY PCR  MAGNESIUM  HIV ANTIBODY (ROUTINE TESTING W REFLEX)  TROPONIN I  TROPONIN I  TROPONIN I  HCG, QUANTITATIVE, PREGNANCY  DIGOXIN LEVEL  I-STAT TROPONIN, ED    EKG EKG Interpretation  Date/Time:  Friday June 05 2018 01:42:51 EDT Ventricular Rate:  86 PR Interval:    QRS Duration: 117 QT Interval:  369 QTC Calculation: 442 R Axis:   86 Text Interpretation:  Sinus rhythm Borderline prolonged PR interval Ventricular preexcitation(WPW) Delta wave present When compared with ECG of 12/16/2017, No significant change was found Confirmed by Dione Booze ([id number]) on 06/05/2018 1:47:36 AM   Radiology Dg Chest 2 View  Result Date: 06/05/2018 CLINICAL DATA:  Dyspnea EXAM: CHEST - 2 VIEW COMPARISON:  04/22/2017 FINDINGS: Unchanged cardiomegaly. ICD device projects over the left hemithorax with lead in the right ventricle as before. Diffuse mild interstitial edema is noted. No effusion or  pneumothorax. No acute osseous abnormality. IMPRESSION: Cardiomegaly with mild interstitial pulmonary edema. Electronically Signed   By: Tollie Eth M.D.   On: 06/05/2018 01:35   Ct Angio Chest Pe W And/or Wo Contrast  Result Date: 06/05/2018 CLINICAL DATA:  Shortness of breath for 2 days. Worse today. Positive D-dimer. EXAM: CT ANGIOGRAPHY CHEST WITH CONTRAST TECHNIQUE: Multidetector CT imaging of the chest was performed using the standard protocol during bolus administration of intravenous contrast. Multiplanar CT image reconstructions and  MIPs were obtained to evaluate the vascular anatomy. CONTRAST:  80mL ISOVUE-370 IOPAMIDOL (ISOVUE-370) INJECTION 76% COMPARISON:  01/13/2006 FINDINGS: Cardiovascular: There is moderately good opacification of the central and segmental pulmonary arteries. No evidence of significant pulmonary embolus. Pulmonary arteries in the lung bases are not opacified due to bolus timing. Cardiac enlargement. No pericardial effusion. Normal caliber of the thoracic aorta. Cardiac pacemaker. Mediastinum/Nodes: Increased density in the anterior mediastinum similar to previous study, likely representing prominent thymic tissue. No significant lymphadenopathy. Esophagus is decompressed. Lungs/Pleura: Motion artifact limits examination. Mild diffuse mosaic attenuation pattern to the lungs may represent motion artifact, edema, air trapping, or airways disease Upper Abdomen: Probable diffuse fatty infiltration of the liver. Musculoskeletal: No chest wall abnormality. No acute or significant osseous findings. Review of the MIP images confirms the above findings. IMPRESSION: No evidence of significant pulmonary embolus. Cardiac enlargement. Mosaic attenuation pattern to the lungs may represent motion artifact, edema, or airways disease. Electronically Signed   By: Burman Nieves M.D.   On: 06/05/2018 05:19    Procedures .Critical Care Performed by: Dierdre Forth, PA-C Authorized by:  Dierdre Forth, PA-C   Critical care provider statement:    Critical care time (minutes):  45   Critical care time was exclusive of:  Separately billable procedures and treating other patients and teaching time   Critical care was necessary to treat or prevent imminent or life-threatening deterioration of the following conditions:  Respiratory failure and cardiac failure   Critical care was time spent personally by me on the following activities:  Discussions with consultants, evaluation of patient's response to treatment, examination of patient, ordering and performing treatments and interventions, ordering and review of laboratory studies, ordering and review of radiographic studies, pulse oximetry, re-evaluation of patient's condition, obtaining history from patient or surrogate and review of old charts   I assumed direction of critical care for this patient from another provider in my specialty: no     (including critical care time)  Medications Ordered in ED Medications  iopamidol (ISOVUE-370) 76 % injection (has no administration in time range)  albuterol (PROVENTIL) (2.5 MG/3ML) 0.083% nebulizer solution 5 mg (has no administration in time range)  carvedilol (COREG) tablet 25 mg (has no administration in time range)  digoxin (LANOXIN) tablet 0.125 mg (has no administration in time range)  sacubitril-valsartan (ENTRESTO) 97-103 mg per tablet (has no administration in time range)  magnesium oxide (MAG-OX) tablet 400 mg (has no administration in time range)  potassium chloride 20 MEQ/15ML (10%) solution 40 mEq (has no administration in time range)  ipratropium-albuterol (DUONEB) 0.5-2.5 (3) MG/3ML nebulizer solution 3 mL (has no administration in time range)  methylPREDNISolone sodium succinate (SOLU-MEDROL) 125 mg/2 mL injection 60 mg (60 mg Intravenous Given 06/05/18 0638)  dextromethorphan-guaiFENesin (MUCINEX DM) 30-600 MG per 12 hr tablet 1 tablet (has no administration in time  range)  sodium chloride flush (NS) 0.9 % injection 3 mL (has no administration in time range)  sodium chloride flush (NS) 0.9 % injection 3 mL (has no administration in time range)  0.9 %  sodium chloride infusion (has no administration in time range)  acetaminophen (TYLENOL) tablet 650 mg (has no administration in time range)  ondansetron (ZOFRAN) injection 4 mg (has no administration in time range)  enoxaparin (LOVENOX) injection 80 mg (has no administration in time range)  aspirin EC tablet 81 mg (has no administration in time range)  zolpidem (AMBIEN) tablet 5 mg (has no administration in time range)  ALPRAZolam (XANAX) tablet 0.25  mg (has no administration in time range)  furosemide (LASIX) injection 80 mg (has no administration in time range)  albuterol (PROVENTIL) (2.5 MG/3ML) 0.083% nebulizer solution 5 mg (5 mg Nebulization Given 06/05/18 0148)  ipratropium (ATROVENT) nebulizer solution 0.5 mg (0.5 mg Nebulization Given 06/05/18 0148)  furosemide (LASIX) injection 80 mg (80 mg Intravenous Given 06/05/18 0445)  iopamidol (ISOVUE-370) 76 % injection 100 mL (80 mLs Intravenous Contrast Given 06/05/18 0420)  albuterol (PROVENTIL) (2.5 MG/3ML) 0.083% nebulizer solution 5 mg (5 mg Nebulization Given 06/05/18 0338)  ipratropium (ATROVENT) nebulizer solution 0.5 mg (0.5 mg Nebulization Given 06/05/18 0339)  potassium chloride SA (K-DUR,KLOR-CON) CR tablet 40 mEq (40 mEq Oral Given 06/05/18 0444)  albuterol (PROVENTIL) (2.5 MG/3ML) 0.083% nebulizer solution 5 mg (5 mg Nebulization Given 06/05/18 0530)  ipratropium (ATROVENT) nebulizer solution 0.5 mg (0.5 mg Nebulization Given 06/05/18 0530)     Initial Impression / Assessment and Plan / ED Course  I have reviewed the triage vital signs and the nursing notes.  Pertinent labs & imaging results that were available during my care of the patient were reviewed by me and considered in my medical decision making (see chart for details).  Clinical Course  as of Jun 05 700  Fri Jun 05, 2018  0131 SPO2 - 91% on RA    [HM]  [phone] Elevated, hypoxia, SOB  D-Dimer, Quant(!): 0.99 [HM]  0333 Hypokalemia.  Oral potassium given  Potassium(!): 3.1 [HM]  0334 Interstitial edema increased from previous CXR in Aug 2018  DG Chest 2 View [HM]  [phone] Hypoxia and tachypnea on oxygen (2L via [place])  SpO2(!): 89 % [HM]  0342 Echo (7/19): EF 20-25%, moderate LV dilation, RV normal size with mildly decreased systolic function, moderate central MR.    [HM]    Clinical Course User Index [HM] Bettyjean Stefanski, Dahlia Client, PA-C    Pt with hx of CHF and Asthma with SOB, DOE, leg swelling.  Asthma va CHF exacerbation.  Hypokalemia, oral given in the department.  Pt with 3+ days of immobilization due to severe SOB.  Elevated d-dimer.  Will need CT angio (Last CT angio 2017).  BNP is slightly elevated at 590.  No leukocytosis, fever or tachycardia to suggest acute infection.  Hypokalemia with potassium 3.1 noted.  Oral potassium given.  2:00 AM Chest x-ray with volume overload and leg swelling.  Patient given 80 mg of IV Lasix for potential congestive heart failure exacerbation.  Patient has an EF of 25%.  3:46 AM Pt continues to have respiratory distress after albuterol and atrovent.  SPO2 89% on oxygen.  Pt has been given solumedrol, lasix, and albuterol with persistent distress.  She will need admission.    6:40 AM She continues to have wheezing in all fields, increased work of breathing and accessory muscle usage after 3 albuterol/Atrovent nebulizers in the emergency room, one with EMS and several prior to calling EMS.  The patient was discussed with and seen by Dr. Preston Fleeting who agrees with the treatment plan.    Final Clinical Impressions(s) / ED Diagnoses   Final diagnoses:  Acute on chronic congestive heart failure, unspecified heart failure type (HCC)  Moderate persistent asthma with acute exacerbation  Peripheral edema  Hypoxia    ED Discharge Orders    None        Mardene Sayer Boyd Kerbs 06/05/18 [id number]    Dione Booze, MD 06/05/18 0730

## 2018-06-06 ENCOUNTER — Encounter (HOSPITAL_COMMUNITY): Payer: Self-pay

## 2018-06-06 DIAGNOSIS — J4541 Moderate persistent asthma with (acute) exacerbation: Secondary | ICD-10-CM

## 2018-06-06 DIAGNOSIS — I509 Heart failure, unspecified: Secondary | ICD-10-CM

## 2018-06-06 DIAGNOSIS — B348 Other viral infections of unspecified site: Secondary | ICD-10-CM

## 2018-06-06 LAB — BASIC METABOLIC PANEL
ANION GAP: 11 (ref 5–15)
BUN: 20 mg/dL (ref 6–20)
CHLORIDE: 95 mmol/L — AB (ref 98–111)
CO2: 31 mmol/L (ref 22–32)
Calcium: 9.2 mg/dL (ref 8.9–10.3)
Creatinine, Ser: 0.91 mg/dL (ref 0.44–1.00)
GFR calc Af Amer: >60 mL/min (ref >60)
GLUCOSE: 156 mg/dL — AB (ref 70–99)
Potassium: 4 mmol/L (ref 3.5–5.1)
Sodium: 137 mmol/L (ref 135–145)

## 2018-06-06 LAB — CBC
HCT: 39.3 % (ref 36.0–46.0)
HEMOGLOBIN: 13.2 g/dL (ref 12.0–15.0)
MCH: 26.8 pg (ref 26.0–34.0)
MCHC: 33.6 g/dL (ref 30.0–36.0)
MCV: 79.7 fL (ref 78.0–100.0)
Platelets: 278 10*3/uL (ref 150–400)
RBC: 4.93 MIL/uL (ref 3.87–5.11)
RDW: 15 % (ref 11.5–15.5)
WBC: 18.9 10*3/uL — ABNORMAL HIGH (ref 4.0–10.5)

## 2018-06-06 MED ORDER — TORSEMIDE 20 MG PO TABS: 40.0000 mg | ORAL_TABLET | Freq: Every day | ORAL | Status: DC

## 2018-06-06 MED ORDER — TORSEMIDE 20 MG PO TABS
80.0000 mg | ORAL_TABLET | Freq: Every morning | ORAL | Status: DC
  Administered 2018-06-07 – 2018-06-08 (×2): 80 mg via ORAL
  Filled 2018-06-06 (×2): qty 4

## 2018-06-06 MED ORDER — PREDNISONE 20 MG PO TABS
40.0000 mg | ORAL_TABLET | Freq: Every day | ORAL | Status: DC
  Administered 2018-06-07 – 2018-06-08 (×2): 40 mg via ORAL
  Filled 2018-06-06 (×2): qty 2

## 2018-06-06 MED ORDER — TORSEMIDE 20 MG PO TABS
40.0000 mg | ORAL_TABLET | Freq: Every evening | ORAL | Status: DC
  Administered 2018-06-06 – 2018-06-07 (×2): 40 mg via ORAL
  Filled 2018-06-06 (×2): qty 2

## 2018-06-06 MED ORDER — SPIRONOLACTONE 12.5 MG HALF TABLET
12.5000 mg | ORAL_TABLET | Freq: Every day | ORAL | Status: DC
  Administered 2018-06-06 – 2018-06-08 (×3): 12.5 mg via ORAL
  Filled 2018-06-06 (×3): qty 1

## 2018-06-06 MED ORDER — TORSEMIDE 20 MG PO TABS: 40.0000 mg | ORAL_TABLET | ORAL | Status: DC

## 2018-06-06 NOTE — Progress Notes (Signed)
Pt is due Entresto. BP 90 / 52. Pt also had 8 beats of V tach earlier, no symptoms. NP on call was notified. He advised to hold Entresto and monitor pt for any other arrhythmias. Will continue to monitor.

## 2018-06-06 NOTE — Progress Notes (Signed)
PROGRESS NOTE  Kirsten Wheeler ZOX:[id number] DOB: [birth year] DOA: 06/05/2018 PCP: Eunice Blase, PA-C  HPI/Brief Narrative  Kirsten Wheeler is a 37 y.o. year old female with medical history significant for chronic systolic CHF with EF of 20%, morbid obesity, status post ICD who presented on 06/05/2018 with several days of worsening shortness of breath and cough after being around her kids who had similar symptoms a few days prior and was found to have acute on chronic systolic CHF exacerbation and asthma exacerbation in the setting of rhinovirus/enterovirus infection.  Chest x-ray on admission showed cardiomegaly with mild interstitial pulmonary edema.  Patient responded well to IV Lasix diuresis and scheduled nebulized treatments.  CT is negative for PE.  Venous duplex still pending results..  Subjective Feels breathing is much better.  Assessment/Plan:  Acute hypoxic respiratory, multifactorial etiology (asthma/acute systolic heart failure exacerbation and positive rhinovirus/enterovirus), improving.  Transition from IV Lasix back to home torsemide regimen and monitor output over the next 24 hours.  Ambulatory oxygen saturation test, not currently requiring oxygen while resting.  Acute on chronic systolic CHF exacerbation, improving.  Transition to oral torsemide at home regimen.  Continue PTA Coreg, digoxin, spironolactone, Entresto.  Asthma exacerbation likely related to rhinovirus.  Continue scheduled nebulizer treatments.  Transition from IV Solu-Medrol to prednisone for short course of 3 days to prevent worsening of CHF flare.  Code Status: Full code  Family Communication: No family at bedside  Disposition Plan: Monitor breathing and output over the next 24 hours, if breathing remains stable and maintains good output on oral diuresis plan for discharge on 06/07/2018.   Consultants:  None     Procedures:  Venous duplex 06/13/2018  Antimicrobials: Anti-infectives (From admission,  onward)   None         Cultures:  RVP positive  DVT prophylaxis: Lovenox   Objective: Vitals:   06/06/18 0900 06/06/18 1336 06/06/18 2021 06/06/18 2031  BP: 100/60  104/60   Pulse:   62   Resp: 18  18   Temp: 98 F (36.7 C)  (!) 97.5 F (36.4 C)   TempSrc: Oral  Oral   SpO2: 96% 96% 94% 98%  Weight:      Height:        Intake/Output Summary (Last 24 hours) at 06/06/2018 2053 Last data filed at 06/06/2018 1914 Gross per 24 hour  Intake 1920 ml  Output 2100 ml  Net -180 ml   Filed Weights   06/05/18 0055 06/05/18 1736 06/06/18 0548  Weight: (!) 167.8 kg (!) 170.8 kg (!) 170.4 kg    Exam: Constitutional: Obese female, no acute distress Eyes EOMI, anicteric, normal conjunctive a ENMT- moist oral mucosa, normal dentition Cardiovascular- regular rate and rhythm with no appreciable murmurs rubs or gallops, no peripheral edema Respiratory effort on room air, intermittent end expiratory wheezing Skin-no rash ulcers or lesions without skin tenting Abdomen-soft, nontender, nondistended Neurologic-grossly no focal neurologic deficit Psychiatric appropriate affect and mood.  Mental status alert oriented x3  Data Reviewed: CBC: Recent Labs  Lab 06/05/18 0146 06/06/18 0515  WBC 7.2 18.9*  NEUTROABS 5.8  --   HGB 12.0 13.2  HCT 35.4* 39.3  MCV 78.8 79.7  PLT 265 278   Basic Metabolic Panel: Recent Labs  Lab 06/01/18 1035 06/05/18 0146 06/05/18 0559 06/06/18 0515  NA 138 137  --  137  K 2.9* 3.1*  --  4.0  CL 94* 95*  --  95*  CO2 31 30  --  31  GLUCOSE 125* 152*  --  156*  BUN 16 15  --  20  CREATININE 0.99 0.98  --  0.91  CALCIUM 9.5 8.8*  --  9.2  MG  --   --  1.7  --    GFR: Estimated Creatinine Clearance: 139.5 mL/min (by C-G formula based on SCr of 0.91 mg/dL). Liver Function Tests: No results for input(s): AST, ALT, ALKPHOS, BILITOT, PROT, ALBUMIN in the last 168 hours. No results for input(s): LIPASE, AMYLASE in the last 168 hours. No  results for input(s): AMMONIA in the last 168 hours. Coagulation Profile: No results for input(s): INR, PROTIME in the last 168 hours. Cardiac Enzymes: Recent Labs  Lab 06/05/18 0559 06/05/18 0959 06/05/18 1857  TROPONINI <0.03 <0.03 <0.03   BNP (last 3 results) No results for input(s): PROBNP in the last 8760 hours. HbA1C: No results for input(s): HGBA1C in the last 72 hours. CBG: No results for input(s): GLUCAP in the last 168 hours. Lipid Profile: No results for input(s): CHOL, HDL, LDLCALC, TRIG, CHOLHDL, LDLDIRECT in the last 72 hours. Thyroid Function Tests: No results for input(s): TSH, T4TOTAL, FREET4, T3FREE, THYROIDAB in the last 72 hours. Anemia Panel: No results for input(s): VITAMINB12, FOLATE, FERRITIN, TIBC, IRON, RETICCTPCT in the last 72 hours. Urine analysis:    Component Value Date/Time   COLORURINE YELLOW 01/14/2016 1250   APPEARANCEUR CLEAR 01/14/2016 1250   LABSPEC 1.012 01/14/2016 1250   PHURINE 6.0 01/14/2016 1250   GLUCOSEU NEGATIVE 01/14/2016 1250   HGBUR NEGATIVE 01/14/2016 1250   BILIRUBINUR NEGATIVE 01/14/2016 1250   KETONESUR NEGATIVE 01/14/2016 1250   PROTEINUR NEGATIVE 01/14/2016 1250   NITRITE NEGATIVE 01/14/2016 1250   LEUKOCYTESUR NEGATIVE 01/14/2016 1250   Sepsis Labs: @LABRCNTIP (procalcitonin:4,lacticidven:4)  ) Recent Results (from the past 240 hour(s))  Respiratory Panel by PCR     Status: Abnormal   Collection Time: 06/05/18  6:48 AM  Result Value Ref Range Status   Adenovirus NOT DETECTED NOT DETECTED Final   Coronavirus 229E NOT DETECTED NOT DETECTED Final   Coronavirus HKU1 NOT DETECTED NOT DETECTED Final   Coronavirus NL63 NOT DETECTED NOT DETECTED Final   Coronavirus OC43 NOT DETECTED NOT DETECTED Final   Metapneumovirus NOT DETECTED NOT DETECTED Final   Rhinovirus / Enterovirus DETECTED (A) NOT DETECTED Final   Influenza A NOT DETECTED NOT DETECTED Final   Influenza B NOT DETECTED NOT DETECTED Final   Parainfluenza  Virus 1 NOT DETECTED NOT DETECTED Final   Parainfluenza Virus 2 NOT DETECTED NOT DETECTED Final   Parainfluenza Virus 3 NOT DETECTED NOT DETECTED Final   Parainfluenza Virus 4 NOT DETECTED NOT DETECTED Final   Respiratory Syncytial Virus NOT DETECTED NOT DETECTED Final   Bordetella pertussis NOT DETECTED NOT DETECTED Final   Chlamydophila pneumoniae NOT DETECTED NOT DETECTED Final   Mycoplasma pneumoniae NOT DETECTED NOT DETECTED Final    Comment: Performed at [facility], [address] [place], [place] [address]      Studies: No results found.  Scheduled Meds: . aspirin EC  81 mg Oral Daily  . carvedilol  25 mg Oral BID WC  . digoxin  0.125 mg Oral Daily  . enoxaparin (LOVENOX) injection  80 mg Subcutaneous Q24H  . ipratropium-albuterol  3 mL Nebulization TID  . magnesium oxide  400 mg Oral Daily  . potassium chloride  40 mEq Oral Daily  . sacubitril-valsartan  1 tablet Oral BID  . senna-docusate  2 tablet Oral BID  . sodium chloride  flush  3 mL Intravenous Q12H  . spironolactone  12.5 mg Oral Daily  . torsemide  40 mg Oral QPM  . [START ON 06/07/2018] torsemide  80 mg Oral q morning - 10a    Continuous Infusions: . sodium chloride       LOS: 1 day     Laverna Peace, MD [facility] Pager [phone]  If 7PM-7AM, please contact night-coverage [email or web address] Password [facility] 06/06/2018, 8:53 PM

## 2018-06-06 NOTE — Progress Notes (Signed)
SATURATION QUALIFICATIONS: (This note is used to comply with regulatory documentation for home oxygen)  Patient Saturations on Room Air at Rest = 96%  Patient Saturations on Room Air while Ambulating =92%  Pt ambulated in a hallway without SOB and distress  Lonia Farber, Charity fundraiser

## 2018-06-07 ENCOUNTER — Encounter (HOSPITAL_COMMUNITY): Payer: Self-pay | Admitting: General Practice

## 2018-06-07 DIAGNOSIS — I959 Hypotension, unspecified: Secondary | ICD-10-CM

## 2018-06-07 LAB — DIGOXIN LEVEL: Digoxin Level: <0.2 ng/mL — ABNORMAL LOW (ref 0.8–2.0)

## 2018-06-07 LAB — BASIC METABOLIC PANEL
Anion gap: 11 (ref 5–15)
BUN: 23 mg/dL — ABNORMAL HIGH (ref 6–20)
CO2: 30 mmol/L (ref 22–32)
CREATININE: 0.94 mg/dL (ref 0.44–1.00)
Calcium: 8.9 mg/dL (ref 8.9–10.3)
Chloride: 96 mmol/L — ABNORMAL LOW (ref 98–111)
GFR calc non Af Amer: >60 mL/min (ref >60)
GLUCOSE: 151 mg/dL — AB (ref 70–99)
Potassium: 3.7 mmol/L (ref 3.5–5.1)
Sodium: 137 mmol/L (ref 135–145)

## 2018-06-07 MED ORDER — DIPHENHYDRAMINE HCL 25 MG PO CAPS
25.0000 mg | ORAL_CAPSULE | Freq: Once | ORAL | Status: AC
  Administered 2018-06-07: 25 mg via ORAL
  Filled 2018-06-07: qty 1

## 2018-06-07 MED ORDER — SODIUM CHLORIDE 0.9 % IV BOLUS
500.0000 mL | Freq: Once | INTRAVENOUS | Status: AC
  Administered 2018-06-07: 500 mL via INTRAVENOUS

## 2018-06-07 NOTE — Progress Notes (Signed)
Pt's BP very soft this AM. Multiple readings of 80s/50s, 70s/30s and one 90/62. Pt is asymptomatic. NP on call was notified. Will continue to monitor.

## 2018-06-07 NOTE — Progress Notes (Signed)
PROGRESS NOTE  Kirsten Wheeler WUJ:[id number] DOB: [birth year] DOA: 06/05/2018 PCP: Eunice Blase, PA-C  HPI/Brief Narrative  Kirsten Wheeler is a 37 y.o. year old female with medical history significant for chronic systolic CHF with EF of 20%, morbid obesity, status post ICD who presented on 06/05/2018 with several days of worsening shortness of breath and cough after being around her kids who had similar symptoms a few days prior and was found to have acute on chronic systolic CHF exacerbation and asthma exacerbation in the setting of rhinovirus/enterovirus infection.  Chest x-ray on admission showed cardiomegaly with mild interstitial pulmonary edema.  Patient responded well to IV Lasix diuresis and scheduled nebulized treatments.  CT is negative for PE.  Venous duplex still pending results..  Subjective Wondering why BP dropping. No complaints. Still coughing with phlegm  Assessment/Plan:  Acute hypoxic respiratory, multifactorial etiology (asthma/acute systolic heart failure exacerbation and positive rhinovirus/enterovirus), resolved.  Transitioned from IV Lasix back to home torsemide regimen on 9/22 no O2 requirements and normal ambulatory oxygen test.    Acute on chronic systolic CHF exacerbation, improving.  Transitioned to oral torsemide at home regimen on 9/22.  Continue PTA Coreg, digoxin, spironolactone, Entresto, digoxin.  Asthma exacerbation likely related to rhinovirus.  Continue scheduled nebulizer treatments.  Transitioned from IV Solu-Medrol to prednisone for short course of 3 days to prevent worsening of CHF flare on 9/21.  Hypotension, new. Had intermittent episodes of low SBP to 80s overnight on 9/21 requiring IVF. Briefly held morning BP meds (entrest, coreg) and responded well to small volume fluid bolus. Continue to monitor. Unclear etiology, doubt related to infection. ON oral diuresis so doubt too much volume take off. Will monitor as may have to reduce one of BP meds prior to  discharge  Leukocytosis. Likely steroid induced. With no signs of bacterial infection otherwise  Code Status: Full code  Family Communication: No family at bedside  Disposition Plan: monitor BP, needs to stabilize without IVF   Consultants:  None     Procedures:  Venous duplex 06/13/2018  Antimicrobials: Anti-infectives (From admission, onward)   None        Cultures:  RVP positive  DVT prophylaxis: Lovenox   Objective: Vitals:   06/07/18 0659 06/07/18 0743 06/07/18 0748 06/07/18 0923  BP:  (!) 78/51 (!) 87/77 109/77  Pulse: 72 69 70   Resp: 20     Temp:      TempSrc:      SpO2: 98%     Weight:      Height:        Intake/Output Summary (Last 24 hours) at 06/07/2018 0941 Last data filed at 06/07/2018 9562 Gross per 24 hour  Intake 960 ml  Output 2600 ml  Net -1640 ml   Filed Weights   06/05/18 1736 06/06/18 0548 06/07/18 0534  Weight: (!) 170.8 kg (!) 170.4 kg (!) 171 kg    Exam: Constitutional: Obese female, no acute distress Eyes EOMI, anicteric, normal conjunctive a ENMT- moist oral mucosa, normal dentition Cardiovascular- regular rate and rhythm with no appreciable murmurs rubs or gallops, no peripheral edema Respiratory- normal respiratory effort on room air, intermittent end expiratory wheezing in airways, no respiratory distress Skin-no rash ulcers or lesions without skin tenting Abdomen-soft, nontender, nondistended Neurologic-grossly no focal neurologic deficit Psychiatric appropriate affect and mood.  Mental status alert oriented x3  Data Reviewed: CBC: Recent Labs  Lab 06/05/18 0146 06/06/18 0515  WBC 7.2 18.9*  NEUTROABS 5.8  --   HGB 12.0  13.2  HCT 35.4* 39.3  MCV 78.8 79.7  PLT 265 278   Basic Metabolic Panel: Recent Labs  Lab 06/01/18 1035 06/05/18 0146 06/05/18 0559 06/06/18 0515 06/07/18 0457  NA 138 137  --  137 137  K 2.9* 3.1*  --  4.0 3.7  CL 94* 95*  --  95* 96*  CO2 31 30  --  31 30  GLUCOSE 125* 152*   --  156* 151*  BUN 16 15  --  20 23*  CREATININE 0.99 0.98  --  0.91 0.94  CALCIUM 9.5 8.8*  --  9.2 8.9  MG  --   --  1.7  --   --    GFR: Estimated Creatinine Clearance: 135.3 mL/min (by C-G formula based on SCr of 0.94 mg/dL). Liver Function Tests: No results for input(s): AST, ALT, ALKPHOS, BILITOT, PROT, ALBUMIN in the last 168 hours. No results for input(s): LIPASE, AMYLASE in the last 168 hours. No results for input(s): AMMONIA in the last 168 hours. Coagulation Profile: No results for input(s): INR, PROTIME in the last 168 hours. Cardiac Enzymes: Recent Labs  Lab 06/05/18 0559 06/05/18 0959 06/05/18 1857  TROPONINI <0.03 <0.03 <0.03   BNP (last 3 results) No results for input(s): PROBNP in the last 8760 hours. HbA1C: No results for input(s): HGBA1C in the last 72 hours. CBG: No results for input(s): GLUCAP in the last 168 hours. Lipid Profile: No results for input(s): CHOL, HDL, LDLCALC, TRIG, CHOLHDL, LDLDIRECT in the last 72 hours. Thyroid Function Tests: No results for input(s): TSH, T4TOTAL, FREET4, T3FREE, THYROIDAB in the last 72 hours. Anemia Panel: No results for input(s): VITAMINB12, FOLATE, FERRITIN, TIBC, IRON, RETICCTPCT in the last 72 hours. Urine analysis:    Component Value Date/Time   COLORURINE YELLOW 01/14/2016 1250   APPEARANCEUR CLEAR 01/14/2016 1250   LABSPEC 1.012 01/14/2016 1250   PHURINE 6.0 01/14/2016 1250   GLUCOSEU NEGATIVE 01/14/2016 1250   HGBUR NEGATIVE 01/14/2016 1250   BILIRUBINUR NEGATIVE 01/14/2016 1250   KETONESUR NEGATIVE 01/14/2016 1250   PROTEINUR NEGATIVE 01/14/2016 1250   NITRITE NEGATIVE 01/14/2016 1250   LEUKOCYTESUR NEGATIVE 01/14/2016 1250   Sepsis Labs: @LABRCNTIP (procalcitonin:4,lacticidven:4)  ) Recent Results (from the past 240 hour(s))  Respiratory Panel by PCR     Status: Abnormal   Collection Time: 06/05/18  6:48 AM  Result Value Ref Range Status   Adenovirus NOT DETECTED NOT DETECTED Final    Coronavirus 229E NOT DETECTED NOT DETECTED Final   Coronavirus HKU1 NOT DETECTED NOT DETECTED Final   Coronavirus NL63 NOT DETECTED NOT DETECTED Final   Coronavirus OC43 NOT DETECTED NOT DETECTED Final   Metapneumovirus NOT DETECTED NOT DETECTED Final   Rhinovirus / Enterovirus DETECTED (A) NOT DETECTED Final   Influenza A NOT DETECTED NOT DETECTED Final   Influenza B NOT DETECTED NOT DETECTED Final   Parainfluenza Virus 1 NOT DETECTED NOT DETECTED Final   Parainfluenza Virus 2 NOT DETECTED NOT DETECTED Final   Parainfluenza Virus 3 NOT DETECTED NOT DETECTED Final   Parainfluenza Virus 4 NOT DETECTED NOT DETECTED Final   Respiratory Syncytial Virus NOT DETECTED NOT DETECTED Final   Bordetella pertussis NOT DETECTED NOT DETECTED Final   Chlamydophila pneumoniae NOT DETECTED NOT DETECTED Final   Mycoplasma pneumoniae NOT DETECTED NOT DETECTED Final    Comment: Performed at [facility], [address] [place], [place] [address]      Studies: No results found.  Scheduled Meds: . aspirin EC  81  mg Oral Daily  . carvedilol  25 mg Oral BID WC  . digoxin  0.125 mg Oral Daily  . enoxaparin (LOVENOX) injection  80 mg Subcutaneous Q24H  . ipratropium-albuterol  3 mL Nebulization TID  . magnesium oxide  400 mg Oral Daily  . potassium chloride  40 mEq Oral Daily  . predniSONE  40 mg Oral Q breakfast  . sacubitril-valsartan  1 tablet Oral BID  . senna-docusate  2 tablet Oral BID  . sodium chloride flush  3 mL Intravenous Q12H  . spironolactone  12.5 mg Oral Daily  . torsemide  40 mg Oral QPM  . torsemide  80 mg Oral q morning - 10a    Continuous Infusions: . sodium chloride       LOS: 2 days     Laverna Peace, MD [facility] Pager [phone]  If 7PM-7AM, please contact night-coverage [email or web address] Password [facility] 06/07/2018, 9:41 AM

## 2018-06-07 NOTE — Progress Notes (Signed)
Entresto and coreg resumed as her BP is 108/76, will continue to monitor BP  Lonia Farber, RN

## 2018-06-08 ENCOUNTER — Inpatient Hospital Stay (HOSPITAL_COMMUNITY): Payer: Medicaid Other

## 2018-06-08 DIAGNOSIS — R609 Edema, unspecified: Secondary | ICD-10-CM

## 2018-06-08 DIAGNOSIS — R0902 Hypoxemia: Secondary | ICD-10-CM

## 2018-06-08 LAB — CBC
HCT: 40.9 % (ref 36.0–46.0)
Hemoglobin: 13.7 g/dL (ref 12.0–15.0)
MCH: 26.7 pg (ref 26.0–34.0)
MCHC: 33.5 g/dL (ref 30.0–36.0)
MCV: 79.6 fL (ref 78.0–100.0)
PLATELETS: 291 10*3/uL (ref 150–400)
RBC: 5.14 MIL/uL — ABNORMAL HIGH (ref 3.87–5.11)
RDW: 15.2 % (ref 11.5–15.5)
WBC: 13.8 10*3/uL — AB (ref 4.0–10.5)

## 2018-06-08 LAB — BASIC METABOLIC PANEL
Anion gap: 9 (ref 5–15)
BUN: 23 mg/dL — AB (ref 6–20)
CALCIUM: 8.6 mg/dL — AB (ref 8.9–10.3)
CO2: 33 mmol/L — ABNORMAL HIGH (ref 22–32)
CREATININE: 0.96 mg/dL (ref 0.44–1.00)
Chloride: 96 mmol/L — ABNORMAL LOW (ref 98–111)
GFR calc Af Amer: >60 mL/min (ref >60)
Glucose, Bld: 131 mg/dL — ABNORMAL HIGH (ref 70–99)
POTASSIUM: 3.3 mmol/L — AB (ref 3.5–5.1)
SODIUM: 138 mmol/L (ref 135–145)

## 2018-06-08 LAB — MAGNESIUM: MAGNESIUM: 1.7 mg/dL (ref 1.7–2.4)

## 2018-06-08 MED ORDER — BENZONATATE 100 MG PO CAPS
100.0000 mg | ORAL_CAPSULE | Freq: Two times a day (BID) | ORAL | Status: DC
  Administered 2018-06-08: 100 mg via ORAL
  Filled 2018-06-08: qty 1

## 2018-06-08 MED ORDER — BENZONATATE 100 MG PO CAPS: 100.0000 mg | ORAL_CAPSULE | Freq: Two times a day (BID) | ORAL | 0 refills | Status: DC

## 2018-06-08 MED ORDER — IPRATROPIUM-ALBUTEROL 0.5-2.5 (3) MG/3ML IN SOLN: 3.0000 mL | RESPIRATORY_TRACT | 0 refills | Status: DC | PRN

## 2018-06-08 MED ORDER — IPRATROPIUM-ALBUTEROL 0.5-2.5 (3) MG/3ML IN SOLN: 3.0000 mL | RESPIRATORY_TRACT | Status: DC | PRN

## 2018-06-08 MED ORDER — DM-GUAIFENESIN ER 30-600 MG PO TB12: 1.0000 | ORAL_TABLET | Freq: Two times a day (BID) | ORAL | 0 refills | Status: DC | PRN

## 2018-06-08 MED ORDER — PREDNISONE 20 MG PO TABS: 40.0000 mg | ORAL_TABLET | Freq: Every day | ORAL | 0 refills | Status: DC

## 2018-06-08 NOTE — Progress Notes (Signed)
CSW received consult regarding transportation needs. Patient reports that she does not have a way home and no money with her. CSW provided bus pass and directions, given to RN. Patient does not qualify for a taxi.   CSW signing off.   Osborne Casco Bazil Dhanani LCSW [phone]

## 2018-06-08 NOTE — Progress Notes (Signed)
Per infection control, patient does not need to be on droplet precautions.

## 2018-06-08 NOTE — Progress Notes (Addendum)
Pt has orders to be discharged. Discharge instructions given and pt has no additional questions at this time. Medication regimen reviewed and pt educated. Pt verbalized understanding and has no additional questions. Telemetry box removed. IV removed and site in good condition. Nebulizer machine has been delivered. Pt stable and waiting for transportation from friend.

## 2018-06-08 NOTE — Discharge Summary (Signed)
Discharge Summary  Kirsten Wheeler ZOX:[id number] DOB: 07-25-1981  PCP: Eunice Blase, PA-C  Admit date: 06/05/2018 Discharge date: 06/11/2018   Time spent: < 25 minutes  Admitted From: home Disposition:  home  Recommendations for Outpatient Follow-up:  1. Follow up with PCP in 1-2 weeks 2. Follow up with primary cardiologist    Discharge Diagnoses:  Active Hospital Problems   Diagnosis Date Noted  . Acute on chronic combined systolic and diastolic CHF (congestive heart failure) (HCC) 01/25/2016  . Hypotension 06/07/2018  . Rhinovirus infection 06/06/2018  . Asthma exacerbation 06/05/2018  . Acute respiratory failure with hypoxia (HCC) 06/05/2018  . Hypokalemia 06/05/2018  . Morbid obesity- BMI 57 01/14/2016    Resolved Hospital Problems  No resolved problems to display.    Discharge Condition: Stable   CODE STATUS:FULL CODE  Diet recommendation:     History of present illness:  Kirsten Wheeler is a 37 y.o. year old female with medical history significant for chronic systolic CHF with EF of 20%, morbid obesity, status post ICD who presented on 06/05/2018 with several days of worsening shortness of breath and cough after being around her kids who had similar symptoms a few days prior and was found to have acute on chronic systolic CHF exacerbation and asthma exacerbation in the setting of rhinovirus/enterovirus infection Remaining hospital course addressed in problem based format below:   Hospital Course:   Acute hypoxic respiratory failure, multifactorial etiology (rhinovirus infection, CHF/asthma exacerbation) Chest x-ray on admission now interstitial edema elevated BNP of 590.  Continue with IV Lasix then transition back to home torsemide regimen for CHF exacerbation.  Scheduled duo nebs in addition to prednisone burst for asthma exacerbation.  Supportive care for rhinovirus infection.  Acute on chronic systolic CHF exacerbation, stable.  IV Lasix diuresis as mentioned  above.  Upon discharge transition back to oral torsemide regimen.  Continue Entresto and carvedilol   Hypotension, resolved.  Episodes of low SBP to 80s that responded to fluids.  Patient is able to maintain her torsemide and blood pressure medications with SBP in the 110s on discharge.   Consultations:  NOne  Procedures/Studies: None  Discharge Exam: BP (!) 104/56   Pulse 72   Temp 97.7 F (36.5 C) (Oral)   Resp 18   Ht 5\' 5"  (1.651 m)   Wt (!) 171.8 kg Comment: Scale B  SpO2 98%   BMI 63.02 kg/m  General obese female, no acute distress Eyes EOMI, anicteric sclera Cardiovascular regular rate and rhythm, no murmurs, no edema Respiratory no respiratory effort on room air, occasional end expiratory wheezing otherwise clear breath sounds Neuro no gross focal deficits, alert and oriented x4.   Discharge Instructions You were cared for by a hospitalist during your hospital stay. If you have any questions about your discharge medications or the care you received while you were in the hospital after you are discharged, you can call the unit and asked to speak with the hospitalist on call if the hospitalist that took care of you is not available. Once you are discharged, your primary care physician will handle any further medical issues. Please note that NO REFILLS for any discharge medications will be authorized once you are discharged, as it is imperative that you return to your primary care physician (or establish a relationship with a primary care physician if you do not have one) for your aftercare needs so that they can reassess your need for medications and monitor your lab values.  Discharge Instructions  Diet - low sodium heart healthy   Complete by:  As directed    Increase activity slowly   Complete by:  As directed      Allergies as of 06/08/2018   No Known Allergies     Medication List    TAKE these medications   acetaminophen 325 MG tablet Commonly known as:   TYLENOL Take 1-2 tablets (325-650 mg total) by mouth every 4 (four) hours as needed for mild pain.   benzonatate 100 MG capsule Commonly known as:  TESSALON Take 1 capsule (100 mg total) by mouth 2 (two) times daily.   carvedilol 25 MG tablet Commonly known as:  COREG Take 1 tablet (25 mg total) by mouth 2 (two) times daily with a meal.   dextromethorphan-guaiFENesin 30-600 MG 12hr tablet Commonly known as:  MUCINEX DM Take 1 tablet by mouth 2 (two) times daily as needed for cough.   digoxin 0.125 MG tablet Commonly known as:  LANOXIN TAKE 1 TABLET(0.125 MG) BY MOUTH DAILY What changed:    how much to take  how to take this  when to take this  additional instructions   ipratropium-albuterol 0.5-2.5 (3) MG/3ML Soln Commonly known as:  DUONEB Take 3 mLs by nebulization every 4 (four) hours as needed.   magnesium oxide 400 MG tablet Commonly known as:  MAG-OX Take 400 mg by mouth daily.   potassium chloride 20 MEQ/15ML (10%) Soln Take 30 mLs (40 mEq total) by mouth 2 (two) times daily.   predniSONE 20 MG tablet Commonly known as:  DELTASONE Take 2 tablets (40 mg total) by mouth daily with breakfast.   PROAIR HFA 108 (90 Base) MCG/ACT inhaler Generic drug:  albuterol Inhale 2 puffs into the lungs every 6 (six) hours as needed for wheezing or shortness of breath.   sacubitril-valsartan 97-103 MG Commonly known as:  ENTRESTO Take 1 tablet by mouth 2 (two) times daily.   spironolactone 25 MG tablet Commonly known as:  ALDACTONE Take 1 tablet (25 mg total) by mouth daily.   torsemide 20 MG tablet Commonly known as:  DEMADEX Take 40-80 mg by mouth See admin instructions. Take 80 mg every morning then take 40 mg every evening   traMADol 50 MG tablet Commonly known as:  ULTRAM Take 1 tablet (50 mg total) by mouth every 6 (six) hours as needed for moderate pain.      No Known Allergies Follow-up Information    O'Buch, Greta, PA-C.   Specialty:  Internal  Medicine Contact information: [address] [place] [address] [phone]        [facility] - Dme Follow up.   Why:  will deliver nebulizer machine to room prior to discharge or pick up at retail store  Contact information: [address] [place] [address] [phone]            The results of significant diagnostics from this hospitalization (including imaging, microbiology, ancillary and laboratory) are listed below for reference.    Significant Diagnostic Studies: Dg Chest 2 View  Result Date: 06/08/2018 CLINICAL DATA:  Dyspnea and increased fluid. EXAM: CHEST - 2 VIEW COMPARISON:  06/05/2018 CXR and 06/05/2018 CT FINDINGS: Cardiomegaly with nonaneurysmal thoracic aorta. Mild interstitial pulmonary edema is noted. No alveolar consolidation, effusion or pneumothorax. Left-sided ICD device with lead in the right ventricle. Acute osseous abnormality. IMPRESSION: Stable cardiomegaly with mild interstitial edema. Findings are similar to that of prior chest radiograph. Electronically Signed  By: Tollie Eth M.D.   On: 06/08/2018 13:40   Dg Chest 2 View  Result Date: 06/05/2018 CLINICAL DATA:  Dyspnea EXAM: CHEST - 2 VIEW COMPARISON:  04/22/2017 FINDINGS: Unchanged cardiomegaly. ICD device projects over the left hemithorax with lead in the right ventricle as before. Diffuse mild interstitial edema is noted. No effusion or pneumothorax. No acute osseous abnormality. IMPRESSION: Cardiomegaly with mild interstitial pulmonary edema. Electronically Signed   By: Tollie Eth M.D.   On: 06/05/2018 01:35   Ct Angio Chest Pe W And/or Wo Contrast  Result Date: 06/05/2018 CLINICAL DATA:  Shortness of breath for 2 days. Worse today. Positive D-dimer. EXAM: CT ANGIOGRAPHY CHEST WITH CONTRAST TECHNIQUE: Multidetector CT imaging of the chest was performed using the standard protocol during bolus administration of intravenous contrast. Multiplanar CT image  reconstructions and MIPs were obtained to evaluate the vascular anatomy. CONTRAST:  80mL ISOVUE-370 IOPAMIDOL (ISOVUE-370) INJECTION 76% COMPARISON:  01/13/2006 FINDINGS: Cardiovascular: There is moderately good opacification of the central and segmental pulmonary arteries. No evidence of significant pulmonary embolus. Pulmonary arteries in the lung bases are not opacified due to bolus timing. Cardiac enlargement. No pericardial effusion. Normal caliber of the thoracic aorta. Cardiac pacemaker. Mediastinum/Nodes: Increased density in the anterior mediastinum similar to previous study, likely representing prominent thymic tissue. No significant lymphadenopathy. Esophagus is decompressed. Lungs/Pleura: Motion artifact limits examination. Mild diffuse mosaic attenuation pattern to the lungs may represent motion artifact, edema, air trapping, or airways disease Upper Abdomen: Probable diffuse fatty infiltration of the liver. Musculoskeletal: No chest wall abnormality. No acute or significant osseous findings. Review of the MIP images confirms the above findings. IMPRESSION: No evidence of significant pulmonary embolus. Cardiac enlargement. Mosaic attenuation pattern to the lungs may represent motion artifact, edema, or airways disease. Electronically Signed   By: Burman Nieves M.D.   On: 06/05/2018 05:19    Microbiology: Recent Results (from the past 240 hour(s))  Respiratory Panel by PCR     Status: Abnormal   Collection Time: 06/05/18  6:48 AM  Result Value Ref Range Status   Adenovirus NOT DETECTED NOT DETECTED Final   Coronavirus 229E NOT DETECTED NOT DETECTED Final   Coronavirus HKU1 NOT DETECTED NOT DETECTED Final   Coronavirus NL63 NOT DETECTED NOT DETECTED Final   Coronavirus OC43 NOT DETECTED NOT DETECTED Final   Metapneumovirus NOT DETECTED NOT DETECTED Final   Rhinovirus / Enterovirus DETECTED (A) NOT DETECTED Final   Influenza A NOT DETECTED NOT DETECTED Final   Influenza B NOT DETECTED  NOT DETECTED Final   Parainfluenza Virus 1 NOT DETECTED NOT DETECTED Final   Parainfluenza Virus 2 NOT DETECTED NOT DETECTED Final   Parainfluenza Virus 3 NOT DETECTED NOT DETECTED Final   Parainfluenza Virus 4 NOT DETECTED NOT DETECTED Final   Respiratory Syncytial Virus NOT DETECTED NOT DETECTED Final   Bordetella pertussis NOT DETECTED NOT DETECTED Final   Chlamydophila pneumoniae NOT DETECTED NOT DETECTED Final   Mycoplasma pneumoniae NOT DETECTED NOT DETECTED Final    Comment: Performed at [facility], [address], [place], [place] [address]     Labs: Basic Metabolic Panel: Recent Labs  Lab 06/05/18 0146 06/05/18 0559 06/06/18 0515 06/07/18 0457 06/08/18 0613 06/08/18 0813  NA 137  --  137 137 138  --   K 3.1*  --  4.0 3.7 3.3*  --   CL 95*  --  95* 96* 96*  --   CO2 30  --  31 30 33*  --  GLUCOSE 152*  --  156* 151* 131*  --   BUN 15  --  20 23* 23*  --   CREATININE 0.98  --  0.91 0.94 0.96  --   CALCIUM 8.8*  --  9.2 8.9 8.6*  --   MG  --  1.7  --   --   --  1.7   Liver Function Tests: No results for input(s): AST, ALT, ALKPHOS, BILITOT, PROT, ALBUMIN in the last 168 hours. No results for input(s): LIPASE, AMYLASE in the last 168 hours. No results for input(s): AMMONIA in the last 168 hours. CBC: Recent Labs  Lab 06/05/18 0146 06/06/18 0515 06/08/18 0813  WBC 7.2 18.9* 13.8*  NEUTROABS 5.8  --   --   HGB 12.0 13.2 13.7  HCT 35.4* 39.3 40.9  MCV 78.8 79.7 79.6  PLT 265 278 291   Cardiac Enzymes: Recent Labs  Lab 06/05/18 0559 06/05/18 0959 06/05/18 1857  TROPONINI <0.03 <0.03 <0.03   BNP: BNP (last 3 results) Recent Labs    06/05/18 0146  BNP 590.0*    ProBNP (last 3 results) No results for input(s): PROBNP in the last 8760 hours.  CBG: No results for input(s): GLUCAP in the last 168 hours.     Signed:  Laverna Peace, MD [facility] 06/11/2018, 10:58 AM

## 2018-06-08 NOTE — Progress Notes (Signed)
Contacted [facility] for neb machine for home. Pt states her machine has a crack. Isidoro Donning RN CCM Case Mgmt phone [phone]

## 2018-06-11 ENCOUNTER — Other Ambulatory Visit (HOSPITAL_COMMUNITY): Payer: Medicaid Other

## 2018-06-16 ENCOUNTER — Other Ambulatory Visit: Payer: Self-pay | Admitting: Cardiology

## 2018-06-17 ENCOUNTER — Telehealth: Payer: Self-pay | Admitting: *Deleted

## 2018-06-17 LAB — CUP PACEART REMOTE DEVICE CHECK
Battery Voltage: 3.02 V
Brady Statistic RV Percent Paced: 0 %
Date Time Interrogation Session: 20190911172803
HighPow Impedance: 73 Ohm
Implantable Lead Location: 753860
Lead Channel Impedance Value: 456 Ohm
Lead Channel Sensing Intrinsic Amplitude: 12.25 mV
Lead Channel Setting Sensing Sensitivity: 0.3 mV
MDC IDC LEAD IMPLANT DT: 20180802
MDC IDC MSMT BATTERY REMAINING LONGEVITY: 131 mo
MDC IDC MSMT LEADCHNL RV IMPEDANCE VALUE: 342 Ohm
MDC IDC MSMT LEADCHNL RV PACING THRESHOLD AMPLITUDE: 0.875 V
MDC IDC MSMT LEADCHNL RV PACING THRESHOLD PULSEWIDTH: 0.4 ms
MDC IDC MSMT LEADCHNL RV SENSING INTR AMPL: 12.25 mV
MDC IDC PG IMPLANT DT: 20180802
MDC IDC SET LEADCHNL RV PACING AMPLITUDE: 2.5 V
MDC IDC SET LEADCHNL RV PACING PULSEWIDTH: 0.4 ms

## 2018-06-17 NOTE — Telephone Encounter (Signed)
-----   Message from Will Jorja Loa, MD sent at 06/17/2018 12:42 PM EDT ----- Abnormal device interrogation reviewed.  Lead parameters and battery status stable.  VT episodes seen. Needs clinic follow up to discuss.

## 2018-06-17 NOTE — Telephone Encounter (Signed)
LMOM to return call to Device Clinic.  Patient overdue to see Dr. Elberta Fortis- no f/u with him since device implantation.

## 2018-06-18 ENCOUNTER — Ambulatory Visit (INDEPENDENT_AMBULATORY_CARE_PROVIDER_SITE_OTHER): Payer: Self-pay | Admitting: Family Medicine

## 2018-06-18 ENCOUNTER — Encounter (INDEPENDENT_AMBULATORY_CARE_PROVIDER_SITE_OTHER): Payer: Self-pay

## 2018-06-19 NOTE — Telephone Encounter (Signed)
Spoke with pt, pt agreeable to apt on 10/15 at 4:15pm with Dr.Camnitz

## 2018-06-23 ENCOUNTER — Inpatient Hospital Stay (HOSPITAL_COMMUNITY): Admission: RE | Admit: 2018-06-23 | Payer: Medicaid Other | Source: Ambulatory Visit

## 2018-06-24 ENCOUNTER — Ambulatory Visit (HOSPITAL_COMMUNITY)
Admission: RE | Admit: 2018-06-24 | Discharge: 2018-06-24 | Disposition: A | Discharge status: Home / Self Care | Payer: Medicaid Other | Source: Ambulatory Visit | Attending: Internal Medicine | Admitting: Internal Medicine

## 2018-06-24 ENCOUNTER — Encounter (HOSPITAL_COMMUNITY): Payer: Self-pay

## 2018-06-24 VITALS — BP 142/84 | HR 92 | Wt 380.4 lb

## 2018-06-24 DIAGNOSIS — G4733 Obstructive sleep apnea (adult) (pediatric): Secondary | ICD-10-CM | POA: Insufficient documentation

## 2018-06-24 DIAGNOSIS — Z6841 Body Mass Index (BMI) 40.0 and over, adult: Secondary | ICD-10-CM | POA: Diagnosis not present

## 2018-06-24 DIAGNOSIS — I5022 Chronic systolic (congestive) heart failure: Secondary | ICD-10-CM | POA: Insufficient documentation

## 2018-06-24 DIAGNOSIS — I340 Nonrheumatic mitral (valve) insufficiency: Secondary | ICD-10-CM | POA: Insufficient documentation

## 2018-06-24 DIAGNOSIS — I428 Other cardiomyopathies: Secondary | ICD-10-CM

## 2018-06-24 LAB — BASIC METABOLIC PANEL
ANION GAP: 8 (ref 5–15)
BUN: 5 mg/dL — ABNORMAL LOW (ref 6–20)
CO2: 27 mmol/L (ref 22–32)
Calcium: 9.4 mg/dL (ref 8.9–10.3)
Chloride: 103 mmol/L (ref 98–111)
Creatinine, Ser: 0.79 mg/dL (ref 0.44–1.00)
Glucose, Bld: 140 mg/dL — ABNORMAL HIGH (ref 70–99)
Potassium: 3.7 mmol/L (ref 3.5–5.1)
SODIUM: 138 mmol/L (ref 135–145)

## 2018-06-24 LAB — DIGOXIN LEVEL: Digoxin Level: 0.3 ng/mL — ABNORMAL LOW (ref 0.8–2.0)

## 2018-06-24 NOTE — Progress Notes (Signed)
CSW met with patient to discuss social determinants of health.  Patient reports that she just moved into [facility] which is a transitional housing program which she states will allow her to stay there up to 2 years if needed.  Patient has no concerns about losing housing at this time.  Patient is concerned about furnishing her home- states she gets disability and that by the time all utilities and necessities are paid for she doesn't have the money to finish furnishing her home- would like living room furniture and dressers if possible- also a bed for herself as her current bed is broken.  Patient also having issues with transportation- doesn't have a car.  Has been getting to medical appointments by cab provided by clinic at this time.  Patient was living in [facility] so currently her Medicaid is still based out of [place] informed pt she needs to follow up with her case worker at [place] office in order to get Medicaid transitioned to [place]- once her Medicaid is in [place] she will be able to scheduled Medicaid transport for all medical appointments.  She states she has an upcoming weight management appointment- CSW to assist with cab to appointment so she will be able to attend.  CSW will continue to follow and assist as needed  Jorge Ny, [place] Social Worker [phone]

## 2018-06-24 NOTE — Patient Instructions (Addendum)
Please TAKE torsemide 4 tablets (80 mg) TWICE DAILY X 2 DAYS THEN DECREASE TO 4 tablets (80 mg) in the morning and 2 tablets (40 mg) in the afternoon.   Please RESTART spironolactone 1 tablet (25 mg) ONCE DAILY.   You are scheduled with Cicero Duck, PharmD again on Wednesday 10/16 at 11:00 AM.   Please keep your appointment with Dr. Shirlee Latch on 07/31/18.

## 2018-06-24 NOTE — Progress Notes (Signed)
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   Admitted 7/27 - 04/22/17 with near-syncope and volume overload. Echo showed reduction in EF from previous. Event concerning for VT so ICD placed 04/17/17. Post-op course complicated by pleuritic chest pain thought to be due to ICD microperforation of cardiac tissue. Taken for lead revision 04/21/17 with improvement. Overall pt diuresed 16.8L though weights were inaccurate. Discharge weight 348 lbs.   Echo 04/13/17 LVEF 15-20%, Severe LV dilation, Mod LAE, Mild RAE.  CPX in 5/19 was submaximal but appeared to show severe functional impairment from HF. Echo was done today and reviewed, moderate LV dilation with EF 20-25%, RV normal size with mildly decreased systolic function, moderate central MR.  Today she returns forpharmacist-led HF medication titration.At last HF clinic visit on 9/16,her Entresto was  increased to 97-103 mg BID. Since then she was hospitalized (9/20-9/26) for acute CHF exacerbation and asthma exacerbation in setting of rhinovirus/enterovirus. Since then, she admits to misplacing her torsemide and spironolactone for at least 2 days ("probably longer"). She is symptomatic today and feels "heavier", has abdominal, facial and LE edema so she knows she is volume overloaded. She is no longer living in a shelter, now has a 3 bedroom/2 bath apartment.   Medtronic device interrogation: Fluid index has now trended above threshold. No AF/VT.   Shortness of breath/dyspnea on exertion?Yes  Orthopnea/PND?Yes  Edema?Yes- 1+, facial and abdominal edema  Lightheadedness/dizziness? No - only in the shower  Daily weights at home?No  Blood pressure/heart rate monitoring at home?No  Following low-sodium/fluid-restricteddiet? Yes  HF Medications: Carvedilol 25 mg PO BID Digoxin 0.125 mg PO  daily MagOx 400 mg PO daily KCl 40 mEq PO BID Entresto97-103mg  PO BID Spironolactone 25 mg PO daily -- ran out x at least 2 days Torsemide 80 mg QAM and 40 mg QPM -- ran out x at least 2 days  Has the patient been experiencing any side effects to the medications prescribed?No  Does the patient have any problems obtaining medications due to transportation or finances?No- [place] Medicaid  Understanding of regimen:good Understanding of indications:good Potential of compliance:fair Patient understands to avoid NSAIDs. Patient understands to avoid decongestants.   Pertinent Lab Values:  06/24/18:Serum creatinine0.79,BUN 5, Potassium3.7, Sodium138, Digoxin 0.3  Vital Signs:  Weight:380 lb(dry weight: 375 lb)  Blood pressure:142/84 mmHg  Heart rate:92 bpm  Assessment: 1. 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 NYHA classIIIsymptoms. - Volume status elevated based on symptoms, weight gain and Optivol reading today - Restart torsemide at 80 mg BID x 2 days then down to 80 mg QAM/40 mg QPM and spironolactone 25 mg daily - Continue carvedilol 25 mg BID, digoxin 0.125 mg daily, KCl 40 meq BID, Entresto 97-103 mg BID - Basic disease state pathophysiology, medication indication, mechanism and side effects reviewed at length with patient and she verbalized understanding  2.WJX:[id number] CPAP  3.Mitral regurgitation:Moderate functional MR on recent echo 7/19  4. Morbid Obesity:Body mass index is 62.42 kg/m. -She plans to go to weight management program info session later this month.  Plan: 1) Medication changes: Based on clinical presentation, vital signs and recent labs willrestart torsemide 80 mg BID x 2 days then back down to 80 mg QAM and 40 mg QPM and spironolactone 25 mg daily 2) Labs: BMET today and at next visit 3) Follow-up:Pharmacy visit on 07/01/18 and Dr. Shirlee Latch on 07/31/18   Cicero Duck  Linford Arnold,  PharmD, BCPS, CPP Clinical Pharmacist Phone: [phone]

## 2018-06-25 ENCOUNTER — Telehealth (HOSPITAL_COMMUNITY): Payer: Self-pay | Admitting: Licensed Clinical Social Worker

## 2018-06-25 NOTE — Telephone Encounter (Signed)
CSW called to inform patient that we would be able to get her a taxi to her weight management appointment on 10/21.  CSW also called [facility] in regards to patient getting approved for furniture and left message- awaiting return call to see if patient can be approved for living room furniture  CSW will continue to follow in clinic  Burna Sis, LCSW Clinical Social Worker [phone]

## 2018-06-29 ENCOUNTER — Encounter: Payer: Self-pay | Admitting: *Deleted

## 2018-06-29 DIAGNOSIS — I509 Heart failure, unspecified: Secondary | ICD-10-CM | POA: Insufficient documentation

## 2018-06-30 ENCOUNTER — Encounter: Payer: Medicaid Other | Admitting: Cardiology

## 2018-06-30 NOTE — Progress Notes (Signed)
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   Admitted 7/27 - 04/22/17 with near-syncope and volume overload. Echo showed reduction in EF from previous. Event concerning for VT so ICD placed 04/17/17. Post-op course complicated by pleuritic chest pain thought to be due to ICD microperforation of cardiac tissue. Taken for lead revision 04/21/17 with improvement. Overall pt diuresed 16.8L though weights were inaccurate. Discharge weight 348 lbs.   Echo 04/13/17 LVEF 15-20%, Severe LV dilation, Mod LAE, Mild RAE.  CPX in 5/19 was submaximal but appeared to show severe functional impairment from HF. Echo was done today and reviewed, moderate LV dilation with EF 20-25%, RV normal size with mildly decreased systolic function, moderate central MR.  Today she returns forpharmacist-led HF medication titration.At last HF clinic visit on 10/9,her torsemide was restarted at 80 mg BID x 2 days then back to 80 mg QAM and 40 mg QPM and she was also restarted on her spironolactone 25 mg daily. She remains symptomatic today but feels better than she did last week after restarting her medications. She does report excellent compliance with her medications and states that she is urinating constantly but does not take her 2nd dose of torsemide until around 11 pm because if she takes it any earlier she will not be able to "get anything done". She is no longer living in a shelter, now has a 3 bedroom/2 bath apartment. She remains in school for business administration and wants to ask Dr. Shirlee Latch when she can go back to work so that she can make more money.   Medtr[employer] device interrogation: Fluid index remains above threshold and continues to trend up. No AF/VT.   Shortness of breath/dyspnea on exertion?Yes - improved since last week  Orthopnea/PND?Yes - improved since last week    Edema?Yes- 1+, abdominal edema  Lightheadedness/dizziness? No - only in the shower  Daily weights at home?No  Blood pressure/heart rate monitoring at home?No  Following low-sodium/fluid-restricteddiet? Yes  HF Medications: Carvedilol25mg  PO BID Digoxin 0.125 mg PO daily MagOx 400 mg PO daily KCl 40 mEq PO BID Entresto97-103mg  PO BID Spironolactone 25 mg PO daily  Torsemide 80 mgQAM and 40 mg QPM  Has the patient been experiencing any side effects to the medications prescribed?No  Does the patient have any problems obtaining medications due to transportation or finances?No- [place] Medicaid  Understanding of regimen:good Understanding of indications:good Potential of compliance:fair Patient understands to avoid NSAIDs. Patient understands to avoid decongestants.   Pertinent Lab Values:  07/01/18:Serum creatinine0.99,BUN12, Potassium3.9, Sodium138  Vital Signs:  Weight:380lb(dry weight: 375 lb)  Blood pressure:132/36mmHg  Heart rate:85bpm  Assessment: 1. 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 NYHA classIIIsymptoms. - Volume status remains elevated based on symptoms, weight and Optivol reading today  - I am concerned about her compliance but will again attempt increase in torsemide to 80 mg BID  - Continuecarvedilol 25 mg BID,digoxin 0.125 mg daily, KCl 40 meq BID, Entresto 97-103 mg BID and spironolactone 25 mg daily  - We discussed potential addition of metolazone or IV lasix in clinic if increase in torsemide does not get fluid off - Basic disease state pathophysiology, medication indication, mechanism and side effects reviewed at length with patient andshe verbalized understanding  2.ZOX:[id number] CPAP  3.Mitral regurgitation:Moderate functional MR onrecent echo 7/19  4. Morbid Obesity:Body mass index is 62.42 kg/m. -She plans to go to weight management program  info  session later this month.  Plan: 1) Medication changes: Based on clinical presentation, vital signs and recent labs willincrease torsemide to 80 mg BID 2) Labs: BMET today 3) Follow-up:Pharmacy visit on 10/24 and Dr. Shirlee Latch on11/15/19   [name], PharmD, BCPS, CPP Clinical Pharmacist Phone: [phone]

## 2018-06-30 NOTE — Progress Notes (Deleted)
Electrophysiology Office Note   Date:  06/30/2018   ID:  Kirsten Wheeler, DOB 11-08-1980, MRN [id number]  PCP:  Eunice Blase, PA-C  Cardiologist:  Shirlee Latch Primary Electrophysiologist:  Matea Stanard Jorja Loa, MD    No chief complaint on file.    History of Present Illness: Kirsten Wheeler is a 37 y.o. female who is being seen today for the evaluation of chronic systolic heart failure at the request of Marca Ancona. Presenting today for electrophysiology evaluation.  She has a history of chronic systolic heart failure, severe mitral regurgitation, morbid obesity, and OSA.  She had a Tronic ICD implanted 04/17/2017 with a lead revision on 04/21/2017 due to microperforation.  Today, she denies*** symptoms of palpitations, chest pain, shortness of breath, orthopnea, PND, lower extremity edema, claudication, dizziness, presyncope, syncope, bleeding, or neurologic sequela. The patient is tolerating medications without difficulties.    Past Medical History:  Diagnosis Date  . Asthma   . Chronic systolic CHF (congestive heart failure) (HCC)    a. ECHO 01/15/2016 EF 10-15%. 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  Marland Kitchen Hearing loss    bilateral  . Mitral regurgitation    a. severe, felt to be functional 2/2 LV dilation   . Morbid obesity (HCC)   . Snoring    Past Surgical History:  Procedure Laterality Date  . CARDIAC CATHETERIZATION N/A 01/18/2016   Procedure: Right/Left Heart Cath and Coronary Angiography;  Surgeon: Laurey Morale, MD;  Location: [facility] INVASIVE CV LAB;  Service: Cardiovascular;  Laterality: N/A;  . CESAREAN SECTION    . ICD IMPLANT N/A 04/17/2017   Procedure: ICD Implant;  Surgeon: Regan Lemming, MD;  Location: [facility] INVASIVE CV LAB;  Service: Cardiovascular;  Laterality: N/A;  . LEAD REVISION/REPAIR N/A 04/21/2017   Procedure: Lead Revision/Repair;  Surgeon: Regan Lemming, MD;  Location: [facility] INVASIVE CV LAB;  Service: Cardiovascular;  Laterality: N/A;  . TONSILLECTOMY        Current Outpatient Medications  Medication Sig Dispense Refill  . acetaminophen (TYLENOL) 325 MG tablet Take 1-2 tablets (325-650 mg total) by mouth every 4 (four) hours as needed for mild pain.    Marland Kitchen albuterol (PROAIR HFA) 108 (90 Base) MCG/ACT inhaler Inhale 2 puffs into the lungs every 6 (six) hours as needed for wheezing or shortness of breath.    . carvedilol (COREG) 25 MG tablet Take 1 tablet (25 mg total) by mouth 2 (two) times daily with a meal. 60 tablet 5  . dextromethorphan-guaiFENesin (MUCINEX DM) 30-600 MG 12hr tablet Take 1 tablet by mouth 2 (two) times daily as needed for cough. 30 tablet 0  . digoxin (LANOXIN) 0.125 MG tablet Take 0.125 mg by mouth daily.    Marland Kitchen ipratropium-albuterol (DUONEB) 0.5-2.5 (3) MG/3ML SOLN Take 3 mLs by nebulization every 4 (four) hours as needed. (Patient not taking: Reported on 06/24/2018) 360 mL 0  . magnesium oxide (MAG-OX) 400 MG tablet Take 400 mg by mouth daily.    . potassium chloride SA (K-DUR,KLOR-CON) 20 MEQ tablet Take 40 mEq by mouth 2 (two) times daily.    . sacubitril-valsartan (ENTRESTO) 97-103 MG Take 1 tablet by mouth 2 (two) times daily. 60 tablet 3  . spironolactone (ALDACTONE) 25 MG tablet Take 1 tablet (25 mg total) by mouth daily. (Patient not taking: Reported on 06/24/2018) 90 tablet 3  . torsemide (DEMADEX) 20 MG tablet Take 40-80 mg by mouth See admin instructions. Take 80 mg every morning then take 40 mg every evening    .  traMADol (ULTRAM) 50 MG tablet Take 1 tablet (50 mg total) by mouth every 6 (six) hours as needed for moderate pain. 28 tablet 0   No current facility-administered medications for this visit.     Allergies:   Patient has no known allergies.   Social History:  The patient  reports that she quit smoking about 2 years ago. She has never used smokeless tobacco. She reports that she drinks alcohol. She reports that she has current or past drug history. Drug: Marijuana.   Family History:  The patient's family  history includes Diabetes in her brother; Heart attack in her mother; Hyperlipidemia in her father.    ROS:  Please see the history of present illness.   Otherwise, review of systems is positive for ***.   All other systems are reviewed and negative.    PHYSICAL EXAM: VS:  There were no vitals taken for this visit. , BMI There is no height or weight on file to calculate BMI. GEN: Well nourished, well developed, in no acute distress  HEENT: normal  Neck: no JVD, carotid bruits, or masses Cardiac: ***RRR; no murmurs, rubs, or gallops,no edema  Respiratory:  clear to auscultation bilaterally, normal work of breathing GI: soft, nontender, nondistended, + BS MS: no deformity or atrophy  Skin: warm and dry, device pocket is well healed Neuro:  Strength and sensation are intact Psych: euthymic mood, full affect  EKG:  EKG {ACTION; IS/IS ZOX:[id number]} ordered today. Personal review of the ekg ordered shows ***  Device interrogation is reviewed today in detail.  See PaceArt for details.   Recent Labs: 06/05/2018: B Natriuretic Peptide 590.0 06/08/2018: Hemoglobin 13.7; Magnesium 1.7; Platelets 291 06/24/2018: BUN 5; Creatinine, Ser 0.79; Potassium 3.7; Sodium 138    Lipid Panel  No results found for: CHOL, TRIG, HDL, CHOLHDL, VLDL, LDLCALC, LDLDIRECT   Wt Readings from Last 3 Encounters:  06/24/18 (!) 380 lb 6.4 oz (172.5 kg)  06/08/18 (!) 378 lb 11.2 oz (171.8 kg)  06/01/18 (!) 374 lb 4 oz (169.8 kg)      Other studies Reviewed: Additional studies/ records that were reviewed today include: TTE 03/30/18  Review of the above records today demonstrates:  - Left ventricle: The cavity size was moderately dilated. Wall   thickness was normal. Systolic function was severely reduced. The   estimated ejection fraction was in the range of 20% to 25%.   Diffuse hypokinesis. Features are consistent with a pseudonormal   left ventricular filling pattern, with concomitant abnormal    relaxation and increased filling pressure (grade 2 diastolic   dysfunction). - Aortic valve: There was no stenosis. - Mitral valve: There was moderate regurgitation. - Left atrium: The atrium was moderately dilated. - Right ventricle: The cavity size was normal. Pacer wire or   catheter noted in right ventricle. Systolic function was mildly   reduced. - Tricuspid valve: Peak RV-RA gradient (S): 28 mm Hg. - Pulmonary arteries: PA peak pressure: 31 mm Hg (S). - Inferior vena cava: The vessel was normal in size. The   respirophasic diameter changes were in the normal range (>= 50%),   consistent with normal central venous pressure.   ASSESSMENT AND PLAN:  1.  Chronic systolic heart failure due to nonischemic cardiomyopathy: Currently on Entresto, Coreg, Aldactone, and torsemide.***  2.  OSA: Continue CPAP  3.  Mitral regurgitation: Moderate and likely functional on most recent echo  4.  Morbid obesity: BMI 62.  Plans weight loss management program this month.  Current medicines are reviewed at length with the patient today.   The patient {ACTIONS; HAS/DOES NOT HAVE:19233} concerns regarding her medicines.  The following changes were made today:  {NONE DEFAULTED:18576::"none"}  Labs/ tests ordered today include: *** No orders of the defined types were placed in this encounter.    Disposition:   FU with Hjalmar Ballengee {gen number 1-61:096045} {Days to years:10300}  Signed, Matai Carpenito Jorja Loa, MD  06/30/2018 8:51 AM     [facility] [address] [place] [address] [phone] (office) [phone] (fax)

## 2018-07-01 ENCOUNTER — Encounter: Payer: Self-pay | Admitting: Cardiology

## 2018-07-01 ENCOUNTER — Ambulatory Visit (HOSPITAL_COMMUNITY)
Admission: RE | Admit: 2018-07-01 | Discharge: 2018-07-01 | Disposition: A | Discharge status: Home / Self Care | Payer: Medicaid Other | Source: Ambulatory Visit | Attending: Internal Medicine | Admitting: Internal Medicine

## 2018-07-01 VITALS — BP 132/78 | HR 85 | Wt 380.2 lb

## 2018-07-01 DIAGNOSIS — G4733 Obstructive sleep apnea (adult) (pediatric): Secondary | ICD-10-CM | POA: Diagnosis not present

## 2018-07-01 DIAGNOSIS — Z6841 Body Mass Index (BMI) 40.0 and over, adult: Secondary | ICD-10-CM | POA: Insufficient documentation

## 2018-07-01 DIAGNOSIS — I5022 Chronic systolic (congestive) heart failure: Secondary | ICD-10-CM | POA: Diagnosis not present

## 2018-07-01 DIAGNOSIS — I340 Nonrheumatic mitral (valve) insufficiency: Secondary | ICD-10-CM | POA: Insufficient documentation

## 2018-07-01 LAB — BASIC METABOLIC PANEL
ANION GAP: 9 (ref 5–15)
BUN: 12 mg/dL (ref 6–20)
CALCIUM: 9.9 mg/dL (ref 8.9–10.3)
CO2: 30 mmol/L (ref 22–32)
Chloride: 99 mmol/L (ref 98–111)
Creatinine, Ser: 0.99 mg/dL (ref 0.44–1.00)
Glucose, Bld: 164 mg/dL — ABNORMAL HIGH (ref 70–99)
POTASSIUM: 3.9 mmol/L (ref 3.5–5.1)
Sodium: 138 mmol/L (ref 135–145)

## 2018-07-01 MED ORDER — TORSEMIDE 20 MG PO TABS: 80.0000 mg | ORAL_TABLET | Freq: Two times a day (BID) | ORAL | 5 refills | Status: DC

## 2018-07-01 NOTE — Patient Instructions (Addendum)
Please INCREASE your torsemide to 80 mg (4 tablets) TWICE DAILY.   You are scheduled to see the pharmacist again on 10/24 at 9:00 AM.

## 2018-07-02 ENCOUNTER — Telehealth (HOSPITAL_COMMUNITY): Payer: Self-pay | Admitting: Licensed Clinical Social Worker

## 2018-07-02 NOTE — Telephone Encounter (Signed)
CSW working on [employer] referral to get patient furniture.  CSW spoke with pt and received permission to reach out to her apartment site in order to get a letter explaining her living situation and recent stay in shelter- pt provided verbal permission.  CSW spoke with [place] at [facility] who agreed to provide letter to CSW  CSW will continue to follow in clinic and assist with needed  Burna Sis, LCSW Clinical Social Worker [phone]

## 2018-07-06 ENCOUNTER — Encounter (INDEPENDENT_AMBULATORY_CARE_PROVIDER_SITE_OTHER): Payer: Self-pay

## 2018-07-06 ENCOUNTER — Ambulatory Visit (INDEPENDENT_AMBULATORY_CARE_PROVIDER_SITE_OTHER): Payer: Medicaid Other | Admitting: Family Medicine

## 2018-07-06 ENCOUNTER — Telehealth (HOSPITAL_COMMUNITY): Payer: Self-pay | Admitting: Licensed Clinical Social Worker

## 2018-07-06 NOTE — Telephone Encounter (Signed)
CSW contacted patient to confirm today's appointment and assist with transportation. Taxi set up and confirmed pick up time with patient. Kirsten Beech, LCSW, CCSW-MCS [phone]

## 2018-07-06 NOTE — Progress Notes (Signed)
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   Admitted 7/27 - 04/22/17 with near-syncope and volume overload. Echo showed reduction in EF from previous. Event concerning for VT so ICD placed 04/17/17. Post-op course complicated by pleuritic chest pain thought to be due to ICD microperforation of cardiac tissue. Taken for lead revision 04/21/17 with improvement. Overall pt diuresed 16.8L though weights were inaccurate. Discharge weight 348 lbs.   Echo 04/13/17 LVEF 15-20%, Severe LV dilation, Mod LAE, Mild RAE.  CPX in 5/19 was submaximal but appeared to show severe functional impairment from HF. Echo was done today and reviewed, moderate LV dilation with EF 20-25%, RV normal size with mildly decreased systolic function, moderate central MR.  Today she returns forpharmacist-led HF medication titration.At last HF clinic visit on10/16,her torsemide was increased to 80 mg BID. Her symptoms have improved significantly since her torsemide was increased. She does report excellent compliance with her medications. She is no longer living in a shelter, now has a3 bedroom/2 bathapartment. She remains in school for business administration and wants to ask Dr. Shirlee Latch when she can go back to work so that she can make more money.   [employer] device interrogation: Fluid index is now back down below threshold. No AF/VT.   Shortness of breath/dyspnea on exertion?Yes - much improved since last week  Orthopnea/PND?Yes - much improved since last week   Edema?No  Lightheadedness/dizziness?No  Daily weights at home?Yes - trending down to 264 lb  Blood pressure/heart rate monitoring at home?No  Following low-sodium/fluid-restricteddiet? Yes  HF Medications: Carvedilol25mg  PO BID Digoxin 0.125 mg PO daily MagOx 400 mg PO daily KCl 40 mEq PO  BID Entresto97-103mg  PO BID Spironolactone 25 mg PO daily Torsemide 80 mgBID  Has the patient been experiencing any side effects to the medications prescribed?No  Does the patient have any problems obtaining medications due to transportation or finances?No- [place] Medicaid  Understanding of regimen:good Understanding of indications:good Potential of compliance:fair Patient understands to avoid NSAIDs. Patient understands to avoid decongestants.   Pertinent Lab Values:  07/09/18:Serum creatinine0.93,BUN14, Potassium3.1, Sodium137  Vital Signs:  Weight:372lb(dry weight: 375 lb)  Blood pressure:136/73mmHg  Heart rate:98bpm  Assessment: 1. 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 NYHA classIIIsymptoms. - Volume status now back down to normal with increase in torsemide dose  - Will decrease torsemide back to 80 mg QAM and 60 mg QPM since she became dehydrated on the higher dose in the past - Continuecarvedilol 25 mg BID,digoxin 0.125 mg daily, KCl 40 meq BID, Entresto97-103mg  BID and spironolactone 25 mg daily - Basic disease state pathophysiology, medication indication, mechanism and side effects reviewed at length with patient andshe verbalized understanding  2.LPF:[id number] CPAP  3.Mitral regurgitation:Moderate functional MR onrecent echo 7/19  4. Morbid Obesity:Body mass index is 62.42 kg/m. -She plans to go to weight management program info session later this month.  Plan: 1) Medication changes: Based on clinical presentation, vital signs and recent labs willdecrease torsemide to 80 mg QAM/60 mg QPM  2) Labs:BMET today 3) Follow-up:Dr. Shirlee Latch on11/15/19   [name], PharmD, BCPS, CPP Clinical Pharmacist Phone: [phone]

## 2018-07-08 ENCOUNTER — Telehealth (HOSPITAL_COMMUNITY): Payer: Self-pay | Admitting: Licensed Clinical Social Worker

## 2018-07-08 NOTE — Telephone Encounter (Signed)
CSW called patient to schedule in-home visit today for [facility] Referral.  CSW completed home visit and finalized [facility] referral.  CSW will continue to follow and assist as needed  Burna Sis, LCSW Clinical Social Worker [facility] [phone]

## 2018-07-09 ENCOUNTER — Telehealth (HOSPITAL_COMMUNITY): Payer: Self-pay | Admitting: *Deleted

## 2018-07-09 ENCOUNTER — Ambulatory Visit (HOSPITAL_COMMUNITY)
Admission: RE | Admit: 2018-07-09 | Discharge: 2018-07-09 | Disposition: A | Discharge status: Home / Self Care | Payer: Medicaid Other | Source: Ambulatory Visit | Attending: Internal Medicine | Admitting: Internal Medicine

## 2018-07-09 VITALS — BP 136/64 | HR 98 | Wt 372.0 lb

## 2018-07-09 DIAGNOSIS — Z5181 Encounter for therapeutic drug level monitoring: Secondary | ICD-10-CM | POA: Diagnosis not present

## 2018-07-09 DIAGNOSIS — I428 Other cardiomyopathies: Secondary | ICD-10-CM | POA: Insufficient documentation

## 2018-07-09 DIAGNOSIS — I340 Nonrheumatic mitral (valve) insufficiency: Secondary | ICD-10-CM | POA: Diagnosis not present

## 2018-07-09 DIAGNOSIS — Z6841 Body Mass Index (BMI) 40.0 and over, adult: Secondary | ICD-10-CM | POA: Diagnosis not present

## 2018-07-09 DIAGNOSIS — Z79899 Other long term (current) drug therapy: Secondary | ICD-10-CM | POA: Insufficient documentation

## 2018-07-09 DIAGNOSIS — G4733 Obstructive sleep apnea (adult) (pediatric): Secondary | ICD-10-CM | POA: Insufficient documentation

## 2018-07-09 DIAGNOSIS — I5022 Chronic systolic (congestive) heart failure: Secondary | ICD-10-CM | POA: Insufficient documentation

## 2018-07-09 LAB — BASIC METABOLIC PANEL
ANION GAP: 9 (ref 5–15)
BUN: 14 mg/dL (ref 6–20)
CALCIUM: 9.5 mg/dL (ref 8.9–10.3)
CO2: 27 mmol/L (ref 22–32)
CREATININE: 0.93 mg/dL (ref 0.44–1.00)
Chloride: 101 mmol/L (ref 98–111)
GFR calc Af Amer: >60 mL/min (ref >60)
GLUCOSE: 140 mg/dL — AB (ref 70–99)
Potassium: 3.1 mmol/L — ABNORMAL LOW (ref 3.5–5.1)
Sodium: 137 mmol/L (ref 135–145)

## 2018-07-09 MED ORDER — POTASSIUM CHLORIDE CRYS ER 20 MEQ PO TBCR: EXTENDED_RELEASE_TABLET | ORAL | 3 refills | Status: DC

## 2018-07-09 MED ORDER — TORSEMIDE 20 MG PO TABS: ORAL_TABLET | ORAL | 5 refills | Status: DC

## 2018-07-09 NOTE — Patient Instructions (Signed)
Please DECREASE your torsemide to 4 tablets (80 mg) in the morning and 3 tablets (60 mg) in the afternoon.   You are scheduled to see Dr. Shirlee Latch on 07/31/18.

## 2018-07-09 NOTE — Telephone Encounter (Signed)
-----   Message from Laurey Morale, MD sent at 07/09/2018  1:15 PM EDT ----- Increase total daily K by 20 mEq

## 2018-07-09 NOTE — Telephone Encounter (Signed)
Notes recorded by Modesta Messing, CMA on 07/09/2018 at 1:43 PM EDT Pt aware and agreeable with plan. ------  Notes recorded by Laurey Morale, MD on 07/09/2018 at 1:15 PM EDT Increase total daily K by 20 mEq

## 2018-07-09 NOTE — Progress Notes (Signed)
CSW met with pt during clinic visit and helped patient complete [employer] application.  Finished application sent to [employer] for review  CSW will continue to follow and assist as needed  Jorge Ny, Scraper Worker [facility] [phone]

## 2018-07-15 ENCOUNTER — Encounter (HOSPITAL_COMMUNITY): Payer: Self-pay | Admitting: Licensed Clinical Social Worker

## 2018-07-15 ENCOUNTER — Telehealth (HOSPITAL_COMMUNITY): Payer: Self-pay | Admitting: Licensed Clinical Social Worker

## 2018-07-15 NOTE — Telephone Encounter (Signed)
CSW called patient to inquire about [employer] appointment- pt confirmed that she was set up with appointment time to pick out furniture.   Appt time: Nov. 12th at 11:15am- will need to bring photo ID  CSW called [employer] to set up moving appointment- left message  CSW will continue to follow in clinic and assist as needed  Burna Sis, LCSW Clinical Social Worker [facility] [phone]

## 2018-07-16 ENCOUNTER — Telehealth (HOSPITAL_COMMUNITY): Payer: Self-pay | Admitting: Licensed Clinical Social Worker

## 2018-07-16 NOTE — Telephone Encounter (Signed)
CSW booked movers through [facility] [employer] for Nov. 12th at 11:15am to take furniture from [employer] to patients apartment.  CSW will continue to follow and assist as needed  Burna Sis, LCSW Clinical Social Worker [facility] [phone]

## 2018-07-20 ENCOUNTER — Ambulatory Visit (INDEPENDENT_AMBULATORY_CARE_PROVIDER_SITE_OTHER): Payer: Medicaid Other | Admitting: Family Medicine

## 2018-07-20 ENCOUNTER — Encounter: Payer: Self-pay | Admitting: Cardiology

## 2018-07-28 ENCOUNTER — Telehealth (HOSPITAL_COMMUNITY): Payer: Self-pay | Admitting: Licensed Clinical Social Worker

## 2018-07-28 NOTE — Telephone Encounter (Signed)
CSW called pt to confirm appt today with [facility] project- pt still able to make it and has found transportation to appt.  CSW met pt and [employer] movers at [place] and confirmed pt all set to get furniture today.  CSW will continue to follow and assist as needed  Jorge Ny, LCSW Clinical Social Worker [facility] [phone]

## 2018-07-31 ENCOUNTER — Encounter (HOSPITAL_COMMUNITY): Payer: Medicaid Other | Admitting: Cardiology

## 2018-07-31 ENCOUNTER — Telehealth (HOSPITAL_COMMUNITY): Payer: Self-pay | Admitting: Licensed Clinical Social Worker

## 2018-07-31 ENCOUNTER — Telehealth (HOSPITAL_COMMUNITY): Payer: Self-pay | Admitting: *Deleted

## 2018-07-31 MED ORDER — TORSEMIDE 20 MG PO TABS: 80.0000 mg | ORAL_TABLET | Freq: Two times a day (BID) | ORAL | 5 refills | Status: DC

## 2018-07-31 MED ORDER — POTASSIUM CHLORIDE CRYS ER 20 MEQ PO TBCR: 60.0000 meq | EXTENDED_RELEASE_TABLET | Freq: Two times a day (BID) | ORAL | 3 refills | Status: DC

## 2018-07-31 NOTE — Telephone Encounter (Signed)
Kirsten Wheeler (Child psychotherapist) reported to me that patients weight is up 10lbs, she is short of breath, and not feeling well. Patient had to cancel her appt today due to conflict with her childs appt today. Pt admitted that she has not been taking her evening Torsemide. Per [name] Increase Torsemide to 80mg  bid and potassium to 60mg  bid and she will need f/u in APP clinic next week. Spoke with patient she is aware and agreeable with plan appt scheduled for Monday.

## 2018-07-31 NOTE — Telephone Encounter (Signed)
CSW called patient to check in.  Pt reports that everything went smoothly with furniture delivery Tuesday and she is grateful to finally have furniture in the house.  Pt also reports that she is not feeling well today and is short of breath.  Pt reports that she has not been taking nighttime dose of fluid medication and that she is up 10 pounds over the past 3 days- pt acknowledges she should be taking her medications as prescribed but that she has been busy with the children and this has distracted her from taking her medications as prescribed.  Pt was supposed to have appt today with Dr. Shirlee Latch but had to reschedule due to medical appt for her daughter- could not reschedule until 1/23.  CSW informed MD clinic of pt concerns- they discussed with APP and will follow up regarding recommendations and getting a closer appt.  CSW will continue to follow in clinic and assist as needed  Burna Sis, LCSW Clinical Social Worker [facility] [phone]

## 2018-08-03 ENCOUNTER — Ambulatory Visit (HOSPITAL_COMMUNITY)
Admission: RE | Admit: 2018-08-03 | Discharge: 2018-08-03 | Disposition: A | Discharge status: Home / Self Care | Payer: Medicaid Other | Source: Ambulatory Visit | Attending: Cardiology | Admitting: Cardiology

## 2018-08-03 ENCOUNTER — Encounter (HOSPITAL_COMMUNITY): Payer: Self-pay

## 2018-08-03 ENCOUNTER — Other Ambulatory Visit: Payer: Self-pay

## 2018-08-03 VITALS — BP 104/88 | HR 96 | Wt 376.2 lb

## 2018-08-03 DIAGNOSIS — Z87891 Personal history of nicotine dependence: Secondary | ICD-10-CM | POA: Diagnosis not present

## 2018-08-03 DIAGNOSIS — Z79899 Other long term (current) drug therapy: Secondary | ICD-10-CM | POA: Diagnosis not present

## 2018-08-03 DIAGNOSIS — G4733 Obstructive sleep apnea (adult) (pediatric): Secondary | ICD-10-CM | POA: Insufficient documentation

## 2018-08-03 DIAGNOSIS — Z8249 Family history of ischemic heart disease and other diseases of the circulatory system: Secondary | ICD-10-CM | POA: Insufficient documentation

## 2018-08-03 DIAGNOSIS — I340 Nonrheumatic mitral (valve) insufficiency: Secondary | ICD-10-CM | POA: Diagnosis not present

## 2018-08-03 DIAGNOSIS — G4730 Sleep apnea, unspecified: Secondary | ICD-10-CM

## 2018-08-03 DIAGNOSIS — Z833 Family history of diabetes mellitus: Secondary | ICD-10-CM | POA: Insufficient documentation

## 2018-08-03 DIAGNOSIS — I5022 Chronic systolic (congestive) heart failure: Secondary | ICD-10-CM | POA: Diagnosis not present

## 2018-08-03 DIAGNOSIS — N912 Amenorrhea, unspecified: Secondary | ICD-10-CM | POA: Diagnosis present

## 2018-08-03 DIAGNOSIS — Z6841 Body Mass Index (BMI) 40.0 and over, adult: Secondary | ICD-10-CM | POA: Insufficient documentation

## 2018-08-03 DIAGNOSIS — I428 Other cardiomyopathies: Secondary | ICD-10-CM | POA: Insufficient documentation

## 2018-08-03 DIAGNOSIS — Z95810 Presence of automatic (implantable) cardiac defibrillator: Secondary | ICD-10-CM | POA: Diagnosis not present

## 2018-08-03 LAB — BASIC METABOLIC PANEL
Anion gap: 8 (ref 5–15)
BUN: 16 mg/dL (ref 6–20)
CHLORIDE: 98 mmol/L (ref 98–111)
CO2: 30 mmol/L (ref 22–32)
Calcium: 9.2 mg/dL (ref 8.9–10.3)
Creatinine, Ser: 1.05 mg/dL — ABNORMAL HIGH (ref 0.44–1.00)
GFR calc Af Amer: >60 mL/min (ref >60)
GLUCOSE: 122 mg/dL — AB (ref 70–99)
POTASSIUM: 3.5 mmol/L (ref 3.5–5.1)
Sodium: 136 mmol/L (ref 135–145)

## 2018-08-03 LAB — HCG, SERUM, QUALITATIVE: PREG SERUM: NEGATIVE

## 2018-08-03 NOTE — Patient Instructions (Signed)
Labs today We will only contact you if something comes back abnormal or we need to make some changes. Otherwise no news is good news!  Your physician recommends that you schedule a follow-up appointment in: 3-4 weeks with the CHF pharmacist  Keep your follow up as scheduled with Dr Shirlee Latch   Do the following things EVERYDAY: 1) Weigh yourself in the morning before breakfast. Write it down and keep it in a log. 2) Take your medicines as prescribed 3) Eat low salt foods-Limit salt (sodium) to 2000 mg per day.  4) Stay as active as you can everyday 5) Limit all fluids for the day to less than 2 liters

## 2018-08-03 NOTE — Progress Notes (Signed)
[name], female   DOB: 02/08/1981, 37 y.o.   MRN: [id number]    Advanced Heart Failure Clinic Note   PCP: [facility] Primary HF Cardiologist: Dr Shirlee Latch   HPI: Kirsten Wheeler is a 37 y.o. 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   Admitted 7/27 - 04/22/17 with near-syncope and volume overload. Echo showed reduction in EF from previous.  Event concerning for VT so ICD placed 04/17/17. Post-op course complicated by pleuritic chest pain thought to be due to ICD microperforation of cardiac tissue. Taken for lead revision 04/21/17 with improvement.  Overall pt diuresed 16.8L though weights were inaccurate. Discharge weight 348 lbs.   Echo 04/13/17  LVEF 15-20%, Severe LV dilation, Mod LAE, Mild RAE.  CPX in 5/19 was submaximal but appeared to show severe functional impairment from HF. Echo was in 7/19 showing moderate LV dilation with EF 20-25%, RV normal size with mildly decreased systolic function, moderate central MR.    She presents today as add on for weight gain. Weight up 2 lbs since visit in September. Called last week with 10 lb weight gain, but pt admitted she had not been taking her evening torsemide. She is feeling better with the fluid off. 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 She walks her daughter to the bus stop, and then twice around her building. Denies dyspnea on flat ground. + SOB with inclines. Denies orthopnea or PND. Not very active otherwise. Taking meds as directed now. She is worried she may be pregnant.   PMH:  1. Chronic systolic CHF: Nonischemic cardiomyopathy.  No family history of cardiomyopathy, no heavy ETOH, cocaine, or amphetamines.  - Echo (12/16) with EF 20-25%, moderate MR.  - Echo (5/17) with severely dilated LV, EF 10-15%, severe functional MR, mildly decreased RV systolic function. - LHC/RHC (4/09): No  significant coronary disease; mean RA 15, PA 61/25 mean 41, mean PCWP 18, CI 1.7 (Fick), PVR 5.4 WU.  - Echo (10/17): EF 20%, severely dilated LV, normal RV size with mildly decreased systolic function, moderate-severe central MR.  - Echo 04/13/17  LVEF 15-20%, Severe LV dilation, Mod LAE, Mild RAE - CPX (5/19): RER 0.95, peak VO2 9.8, VE/VCO2 26 => submaximal but likely severe functional impairement.  - Echo (7/19): EF 20-25%, moderate LV dilation, RV normal size with mildly decreased systolic function, moderate central MR.  2. Morbid obesity.  3. 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  Echo (10/17) with moderate-severe MR. Echo (7/19) with moderate central MR.  4. OSA: CPAP.   Review of systems complete and found to be negative unless listed in HPI.    SH:  Social History   Socioeconomic History  . Marital status: Single    Spouse name: Not on file  . Number of children: 2  . Years of education: Not on file  . Highest education level: Not on file  Occupational History  . Not on file  Social Needs  . Financial resource strain: Not very hard  . Food insecurity:    Worry: Never true    Inability: Never true  . Transportation needs:    Medical: Yes    Non-medical: Yes  Tobacco Use  . Smoking status: Former Smoker    Last attempt to quit: 10/19/2015    Years since quitting: 2.7  . Smokeless tobacco: Never Used  Substance and Sexual  Activity  . Alcohol use: Yes    Alcohol/week: 0.0 standard drinks    Comment: occasionally  . Drug use: Yes    Types: Marijuana    Comment: only socially  . Sexual activity: Yes  Lifestyle  . Physical activity:    Days per week: Not on file    Minutes per session: Not on file  . Stress: Not on file  Relationships  . Social connections:    Talks on phone: Not on file    Gets together: Not on file    Attends religious service: Not on file    Active member of club or organization: Not on file    Attends meetings of clubs or  organizations: Not on file    Relationship status: Not on file  . Intimate partner violence:    Fear of current or ex partner: Not on file    Emotionally abused: Not on file    Physically abused: Not on file    Forced sexual activity: Not on file  Other Topics Concern  . Not on file  Social History Narrative  . Not on file   FH:  Family History  Problem Relation Age of Onset  . Heart attack Mother   . Hyperlipidemia Father   . Diabetes Brother    Current Outpatient Medications  Medication Sig Dispense Refill  . carvedilol (COREG) 25 MG tablet Take 1 tablet (25 mg total) by mouth 2 (two) times daily with a meal. 60 tablet 5  . sacubitril-valsartan (ENTRESTO) 97-103 MG Take 1 tablet by mouth 2 (two) times daily. 60 tablet 3  . spironolactone (ALDACTONE) 25 MG tablet Take 1 tablet (25 mg total) by mouth daily. 90 tablet 3  . torsemide (DEMADEX) 20 MG tablet Take 4 tablets (80 mg total) by mouth 2 (two) times daily. 240 tablet 5  . acetaminophen (TYLENOL) 325 MG tablet Take 1-2 tablets (325-650 mg total) by mouth every 4 (four) hours as needed for mild pain.    Marland Kitchen albuterol (PROAIR HFA) 108 (90 Base) MCG/ACT inhaler Inhale 2 puffs into the lungs every 6 (six) hours as needed for wheezing or shortness of breath.    . dextromethorphan-guaiFENesin (MUCINEX DM) 30-600 MG 12hr tablet Take 1 tablet by mouth 2 (two) times daily as needed for cough. 30 tablet 0  . digoxin (LANOXIN) 0.125 MG tablet Take 0.125 mg by mouth daily.    Marland Kitchen ipratropium-albuterol (DUONEB) 0.5-2.5 (3) MG/3ML SOLN Take 3 mLs by nebulization every 4 (four) hours as needed. 360 mL 0  . magnesium oxide (MAG-OX) 400 MG tablet Take 400 mg by mouth daily.    . potassium chloride SA (K-DUR,KLOR-CON) 20 MEQ tablet Take 3 tablets (60 mEq total) by mouth 2 (two) times daily. (Patient not taking: Reported on 08/03/2018) 150 tablet 3  . traMADol (ULTRAM) 50 MG tablet Take 1 tablet (50 mg total) by mouth every 6 (six) hours as needed for  moderate pain. 28 tablet 0   No current facility-administered medications for this encounter.    Vitals:   08/03/18 0959  BP: 104/88  Pulse: 96  SpO2: 96%  Weight: (!) 170.6 kg (376 lb 3.2 oz)   Wt Readings from Last 3 Encounters:  08/03/18 (!) 170.6 kg (376 lb 3.2 oz)  07/09/18 (!) 168.7 kg (372 lb)  07/01/18 (!) 172.5 kg (380 lb 3.2 oz)    PHYSICAL EXAM: General: NAD HEENT: Normal Neck: Supple. JVP 5-6. Carotids 2+ bilat; no bruits. No thyromegaly or nodule noted. Cor:  PMI nondisplaced. RRR, No M/G/R noted Lungs: CTAB, normal effort. Abdomen: Markedly obese, soft, non-tender, non-distended, no HSM. No bruits or masses. +BS  Extremities: No cyanosis, clubbing, or rash. Trace ankle edema.  Neuro: Alert & orientedx3, cranial nerves grossly intact. moves all 4 extremities w/o difficulty. Affect pleasant   ASSESSMENT & PLAN: 1.Chronic Systolic HF:  Nonischemic cardiomyopathy.  Echo 10/17 with severely dilated LV, EF 20%, moderate-severe central MR.  Echo (7/18) with EF 15-20%.  Echo (7/19) showed EF 20-25%, moderate LV dilation, mildly decreased RV systolic function, moderate functional MR. CPX (5/19) was submaximal but concerning for severe functional impairment due to HF.  No history of heavy ETOH or cocaine.  No family history of cardiomyopathy.  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  Despite recent CPX, she seems to be doing reasonably well symptomatically.  NYHA class II-III symptoms. Volume status improving back on torsemide as directed.   - Continue torsemide 80 mg BID.  BMET today. - Continue Entresto 97/103 mg BID.  - Continue coreg 25 mg BID.  - Continue spironolactone 25 mg daily.   - Continue digoxin 0.125 mg daily. Level stable 06/24/18.  - Next step will be addition of Bidil. She declines today.  - Have previously discussed the need to prevent pregnancy with HF meds. Check HcG today, will need to discuss with MD if + - Narrow QRS so not  candidate for CRT upgrade.  - Based on CPX, she may be nearing the need for advanced therapies.  Obesity precludes transplant.  However, at this point, she seems to be improving some.  2. OSA: Encouraged nightly CPAP.  3.Mitral regurgitation: Moderate functional MR on 7/19 echo. No change.  4. Morbid Obesity: - Body mass index is 62.6 kg/m.  - Encouraged to go to weight management program info session later this month.   Improved taking torsemide as directed. Labs today. Keep 2 month appt. Pharm-D visit in 3-4 weeks to consider bidil.   Graciella Freer, PA-C  08/03/2018   Greater than 50% of the 25 minute visit was spent in counseling/coordination of care regarding disease state education, salt/fluid restriction, sliding scale diuretics, and medication compliance.

## 2018-08-07 ENCOUNTER — Other Ambulatory Visit (HOSPITAL_COMMUNITY): Payer: Self-pay | Admitting: Student

## 2018-08-11 ENCOUNTER — Ambulatory Visit: Payer: Medicaid Other | Admitting: Cardiology

## 2018-08-24 NOTE — Progress Notes (Signed)
HF MD: [facility]  HPI: Jo-Ann Fountainis a 37 y.o.femalewith a past medical history significant for what is thought to be a postpartum cardiomyopathy and morbid obesity.   Admitted 7/27 - 04/22/17 with near-syncope and volume overload. Echo showed reduction in EF from previous. Event concerning for VT so ICD placed 04/17/17. Post-op course complicated by pleuritic chest pain thought to be due to ICD microperforation of cardiac tissue. Taken for lead revision 04/21/17 with improvement. Overall pt diuresed 16.8L though weights were inaccurate. Discharge weight 348 lbs.   Echo 04/13/17 LVEF 15-20%, Severe LV dilation, Mod LAE, Mild RAE.  CPX in 5/19 was submaximal but appeared to show severe functional impairment from HF. Echo was done today and reviewed, moderate LV dilation with EF 20-25%, RV normal size with mildly decreased systolic function, moderate central MR.  Today she returns forpharmacist-led HF medication titration.At last HF clinic visit on11/18,no medication changes were made. Had a URI/sore throat that has resolved. Her symptoms have improved significantly since her torsemide was increased although she has been taking 80 mg QAM and 60 mg QPM because she thought this is what Mardelle Matte told her to do at her last visit. She does state that she has been feeling more fatigued recently and when taking her torsemide she feels like she has been urinating less and her kidneys have been hurting. She does report excellent compliance with her medications.She is no longer living in a shelter, now has a3 bedroom/2 bathapartment.She remains in school for business administration and wants to ask Dr. 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   Medtronic device interrogation: Fluid indexis now back down below threshold. No AF/VT.   Shortness of breath/dyspnea on exertion?Yes-  stable  Orthopnea/PND?Yes- stable  Edema?No  Lightheadedness/dizziness?No  Daily weights at home?Yes - trending down to 264 lb  Blood pressure/heart rate monitoring at home?No  Following low-sodium/fluid-restricteddiet? Yes  HF Medications: Carvedilol25mg  PO BID Digoxin 0.125 mg PO daily MagOx 400 mg PO daily KCl 40 mEq PO BID Entresto97-103mg  PO BID Spironolactone 25 mg PO daily Torsemide 80 mgQAM and 60 mg QPM  Has the patient been experiencing any side effects to the medications prescribed?No  Does the patient have any problems obtaining medications due to transportation or finances?No- [place] Medicaid  Understanding of regimen:good Understanding of indications:good Potential of compliance:fair Patient understands to avoid NSAIDs. Patient understands to avoid decongestants.   Pertinent Lab Values:  08/25/18: Serum creatinine 0.79,BUN9, Potassium3.3, Sodium138, BNP 126.8  08/03/18:Serum creatinine1.05,BUN16, Potassium3.5, Sodium136  06/24/18: Digoxin 0.3  Vital Signs:  Weight:375lb(dry weight: 375 lb)  Blood pressure:132/45mmHg  Heart rate:94bpm  Assessment: 1. 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 NYHA classIIIsymptoms. - Volume status now back down to normal - Will decrease torsemide back to 60 mg BID since she has been complaining of less frequent urination and "kidney pain" - Continuecarvedilol 25 mg BID,digoxin 0.125 mg daily, KCl 40 meq BID, Entresto97-103mg  BIDand spironolactone 25 mg daily  - Would like to hold off on starting Bidil today because she is nervous about her BP dropping too low with it. We did discuss that we could start her on a lower dose 1/2 tab TID and see how she feels on this dose. She would be agreeable to that but would like to hold off until her next visit - Basic disease state pathophysiology, medication indication, mechanism and side  effects  reviewed at length with patient andshe verbalized understanding  2.ZMO:QHUTMLYY CPAP  3.Mitral regurgitation:Moderate functional MR onrecent echo 7/19  4. Morbid Obesity:Body mass index is 62.42 kg/m. -She plans to go to weight management program info session later this month.  Plan: 1) Medication changes: Based on clinical presentation, vital signs and recent labs willdecrease torsemide 60 mg BID 2) Labs:BMET today 3) Follow-up:Dr. Shirlee Latch on1/23/20  [name], PharmD, BCPS, CPP Clinical Pharmacist Phone: [phone] 08/24/2018 3:26 PM

## 2018-08-25 ENCOUNTER — Ambulatory Visit (HOSPITAL_COMMUNITY)
Admission: RE | Admit: 2018-08-25 | Discharge: 2018-08-25 | Disposition: A | Discharge status: Home / Self Care | Payer: Medicaid Other | Source: Ambulatory Visit | Attending: Internal Medicine | Admitting: Internal Medicine

## 2018-08-25 ENCOUNTER — Telehealth (HOSPITAL_COMMUNITY): Payer: Self-pay

## 2018-08-25 VITALS — BP 132/76 | HR 94 | Wt 375.0 lb

## 2018-08-25 DIAGNOSIS — I5022 Chronic systolic (congestive) heart failure: Secondary | ICD-10-CM | POA: Diagnosis present

## 2018-08-25 DIAGNOSIS — G4733 Obstructive sleep apnea (adult) (pediatric): Secondary | ICD-10-CM | POA: Diagnosis not present

## 2018-08-25 DIAGNOSIS — I340 Nonrheumatic mitral (valve) insufficiency: Secondary | ICD-10-CM | POA: Diagnosis not present

## 2018-08-25 DIAGNOSIS — Z6841 Body Mass Index (BMI) 40.0 and over, adult: Secondary | ICD-10-CM | POA: Diagnosis not present

## 2018-08-25 DIAGNOSIS — Z79899 Other long term (current) drug therapy: Secondary | ICD-10-CM | POA: Insufficient documentation

## 2018-08-25 LAB — BASIC METABOLIC PANEL
ANION GAP: 11 (ref 5–15)
BUN: 9 mg/dL (ref 6–20)
CO2: 26 mmol/L (ref 22–32)
Calcium: 9.5 mg/dL (ref 8.9–10.3)
Chloride: 101 mmol/L (ref 98–111)
Creatinine, Ser: 0.79 mg/dL (ref 0.44–1.00)
Glucose, Bld: 136 mg/dL — ABNORMAL HIGH (ref 70–99)
POTASSIUM: 3.3 mmol/L — AB (ref 3.5–5.1)
SODIUM: 138 mmol/L (ref 135–145)

## 2018-08-25 LAB — BRAIN NATRIURETIC PEPTIDE: B NATRIURETIC PEPTIDE 5: 126.8 pg/mL — AB (ref 0.0–100.0)

## 2018-08-25 MED ORDER — TORSEMIDE 20 MG PO TABS: 60.0000 mg | ORAL_TABLET | Freq: Two times a day (BID) | ORAL | 5 refills | Status: DC

## 2018-08-25 MED ORDER — POTASSIUM CHLORIDE CRYS ER 20 MEQ PO TBCR: EXTENDED_RELEASE_TABLET | ORAL | 3 refills | Status: DC

## 2018-08-25 NOTE — Telephone Encounter (Signed)
K 3.3.  Spoke with patient, she is currently taking KCL BID.  Per Dr. Shirlee Latch, pt to increase morning KCL to 60 meq (3 tabs) and continue (2 tabs) in the evening. Pt will return to clinic for repeat labs in 2 weeks. Pt states understanding and appreciative. Lab appointment made.

## 2018-08-25 NOTE — Patient Instructions (Signed)
DECREASE your torsemide to 3 tablets (60 mg) TWICE DAILY. Take additional tablet in the morning as needed for weight gain, shortness of breath or swelling.   Blood work today. We will call you with any changes.   Please keep your appointment with Dr. Shirlee Latch on 10/08/18.

## 2018-08-25 NOTE — Telephone Encounter (Signed)
-----   Message from Laurey Morale, MD sent at 08/25/2018 12:20 PM EST ----- Increase total daily KCl by 20 mEq. BMET 2 wks.

## 2018-08-26 ENCOUNTER — Telehealth: Payer: Self-pay | Admitting: Cardiology

## 2018-08-26 NOTE — Telephone Encounter (Signed)
Attempted to confirm remote transmission with pt. No answer and was unable to leave a message.   

## 2018-08-28 ENCOUNTER — Encounter: Payer: Self-pay | Admitting: Cardiology

## 2018-09-08 ENCOUNTER — Ambulatory Visit (HOSPITAL_COMMUNITY)
Admission: RE | Admit: 2018-09-08 | Discharge: 2018-09-08 | Disposition: A | Discharge status: Home / Self Care | Payer: Medicaid Other | Source: Ambulatory Visit | Attending: Internal Medicine | Admitting: Internal Medicine

## 2018-09-08 DIAGNOSIS — I5022 Chronic systolic (congestive) heart failure: Secondary | ICD-10-CM | POA: Insufficient documentation

## 2018-09-08 LAB — BASIC METABOLIC PANEL
Anion gap: 9 (ref 5–15)
BUN: 15 mg/dL (ref 6–20)
CHLORIDE: 98 mmol/L (ref 98–111)
CO2: 30 mmol/L (ref 22–32)
Calcium: 8.9 mg/dL (ref 8.9–10.3)
Creatinine, Ser: 1.16 mg/dL — ABNORMAL HIGH (ref 0.44–1.00)
GFR calc Af Amer: >60 mL/min (ref >60)
GFR calc non Af Amer: >60 mL/min (ref >60)
Glucose, Bld: 144 mg/dL — ABNORMAL HIGH (ref 70–99)
Potassium: 3.4 mmol/L — ABNORMAL LOW (ref 3.5–5.1)
SODIUM: 137 mmol/L (ref 135–145)

## 2018-10-08 ENCOUNTER — Ambulatory Visit (HOSPITAL_COMMUNITY)
Admission: RE | Admit: 2018-10-08 | Discharge: 2018-10-08 | Disposition: A | Discharge status: Home / Self Care | Payer: Medicaid Other | Source: Ambulatory Visit | Attending: Cardiology | Admitting: Cardiology

## 2018-10-08 ENCOUNTER — Encounter (HOSPITAL_COMMUNITY): Payer: Self-pay | Admitting: Cardiology

## 2018-10-08 VITALS — BP 116/60 | HR 103 | Wt 385.2 lb

## 2018-10-08 DIAGNOSIS — G4733 Obstructive sleep apnea (adult) (pediatric): Secondary | ICD-10-CM | POA: Diagnosis not present

## 2018-10-08 DIAGNOSIS — Z833 Family history of diabetes mellitus: Secondary | ICD-10-CM | POA: Insufficient documentation

## 2018-10-08 DIAGNOSIS — I5022 Chronic systolic (congestive) heart failure: Secondary | ICD-10-CM | POA: Diagnosis not present

## 2018-10-08 DIAGNOSIS — Z6841 Body Mass Index (BMI) 40.0 and over, adult: Secondary | ICD-10-CM | POA: Insufficient documentation

## 2018-10-08 DIAGNOSIS — I340 Nonrheumatic mitral (valve) insufficiency: Secondary | ICD-10-CM | POA: Insufficient documentation

## 2018-10-08 DIAGNOSIS — R9431 Abnormal electrocardiogram [ECG] [EKG]: Secondary | ICD-10-CM | POA: Diagnosis not present

## 2018-10-08 DIAGNOSIS — Z79899 Other long term (current) drug therapy: Secondary | ICD-10-CM | POA: Diagnosis not present

## 2018-10-08 DIAGNOSIS — Z87891 Personal history of nicotine dependence: Secondary | ICD-10-CM | POA: Insufficient documentation

## 2018-10-08 DIAGNOSIS — Z95810 Presence of automatic (implantable) cardiac defibrillator: Secondary | ICD-10-CM | POA: Diagnosis not present

## 2018-10-08 DIAGNOSIS — I428 Other cardiomyopathies: Secondary | ICD-10-CM | POA: Insufficient documentation

## 2018-10-08 DIAGNOSIS — Z8249 Family history of ischemic heart disease and other diseases of the circulatory system: Secondary | ICD-10-CM | POA: Diagnosis not present

## 2018-10-08 LAB — BASIC METABOLIC PANEL
ANION GAP: 7 (ref 5–15)
BUN: 11 mg/dL (ref 6–20)
CALCIUM: 9.2 mg/dL (ref 8.9–10.3)
CO2: 28 mmol/L (ref 22–32)
Chloride: 101 mmol/L (ref 98–111)
Creatinine, Ser: 0.91 mg/dL (ref 0.44–1.00)
Glucose, Bld: 133 mg/dL — ABNORMAL HIGH (ref 70–99)
Potassium: 3.4 mmol/L — ABNORMAL LOW (ref 3.5–5.1)
Sodium: 136 mmol/L (ref 135–145)

## 2018-10-08 LAB — DIGOXIN LEVEL

## 2018-10-08 MED ORDER — POTASSIUM CHLORIDE ER 20 MEQ PO TBCR: 20.0000 meq | EXTENDED_RELEASE_TABLET | Freq: Every day | ORAL | 6 refills | Status: DC

## 2018-10-08 MED ORDER — POTASSIUM CHLORIDE ER 20 MEQ PO TBCR: 10.0000 meq | EXTENDED_RELEASE_TABLET | Freq: Every day | ORAL | 6 refills | Status: DC

## 2018-10-08 MED ORDER — POTASSIUM CHLORIDE CRYS ER 20 MEQ PO TBCR: EXTENDED_RELEASE_TABLET | ORAL | 3 refills | Status: DC

## 2018-10-08 MED ORDER — IVABRADINE HCL 5 MG PO TABS: 5.0000 mg | ORAL_TABLET | Freq: Two times a day (BID) | ORAL | 6 refills | Status: DC

## 2018-10-08 MED ORDER — TORSEMIDE 20 MG PO TABS: ORAL_TABLET | ORAL | 5 refills | Status: DC

## 2018-10-08 NOTE — Progress Notes (Signed)
Patient ID: Kirsten Grainnya Comrie, female   DOB: 10/12/1980, 38 y.o.   MRN: [id number]    Advanced Heart Failure Clinic Note   [facility] Primary HF Cardiologist: Dr Shirlee LatchMcLean   HPI: Kirsten Grainnya Dills is a 38 y.o. 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   Admitted 7/27 - 04/22/17 with near-syncope and volume overload. Echo showed reduction in EF from previous.  Event concerning for VT so ICD placed 04/17/17. Post-op course complicated by pleuritic chest pain thought to be due to ICD microperforation of cardiac tissue. Taken for lead revision 04/21/17 with improvement.  Overall pt diuresed 16.8L though weights were inaccurate. Discharge weight 348 lbs.   Echo 04/13/17  LVEF 15-20%, Severe LV dilation, Mod LAE, Mild RAE.  CPX in 5/19 was submaximal but appeared to show severe functional impairment from HF. Echo was in 7/19 showing moderate LV dilation with EF 20-25%, RV normal size with mildly decreased systolic function, moderate central MR.    Today, she returns for HF followup. She is taking her medications.  Weight is up 9 lbs over the holidays.  Says she has been trying to avoid sodium but legs are swelling.  She wheezes occasionally.  She gets short of breath walking for more than about 5 minutes or going up stairs. No orthopnea/PND.  No chest pain.  Occasional lightheadedness when she first stands up.    ECG (personally reviewed): NSR, nonspecific T wave changes.    Labs (12/19): K 3.4, creatinine 1.16  PMH:  1. Chronic systolic CHF: Nonischemic cardiomyopathy.  No family history of cardiomyopathy, no heavy ETOH, cocaine, or amphetamines.  Medtronic ICD.  - Echo (12/16) with EF 20-25%, moderate MR.  - Echo (5/17) with severely dilated LV, EF 10-15%, severe functional MR, mildly decreased RV systolic function. - LHC/RHC (4/095/17): No significant coronary disease; mean RA 15, PA 61/25 mean 41, mean PCWP 18, CI 1.7 (Fick), PVR  5.4 WU.  - Echo (10/17): EF 20%, severely dilated LV, normal RV size with mildly decreased systolic function, moderate-severe central MR.  - Echo 04/13/17  LVEF 15-20%, Severe LV dilation, Mod LAE, Mild RAE - CPX (5/19): RER 0.95, peak VO2 9.8, VE/VCO2 26 => submaximal but likely severe functional impairement.  - Echo (7/19): EF 20-25%, moderate LV dilation, RV normal size with mildly decreased systolic function, moderate central MR.  2. Morbid obesity.  3. 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  Echo (10/17) with moderate-severe MR. Echo (7/19) with moderate central MR.  4. OSA: CPAP.   Review of systems complete and found to be negative unless listed in HPI.   SH:  Social History   Socioeconomic History  . Marital status: Single    Spouse name: Not on file  . Number of children: 2  . Years of education: Not on file  . Highest education level: Not on file  Occupational History  . Not on file  Social Needs  . Financial resource strain: Not very hard  . Food insecurity:    Worry: Never true    Inability: Never true  . Transportation needs:    Medical: Yes    Non-medical: Yes  Tobacco Use  . Smoking status: Former Smoker    Last attempt to quit: 10/19/2015    Years since quitting: 2.9  . Smokeless tobacco: Never Used  Substance and Sexual Activity  . Alcohol use: Yes    Alcohol/week: 0.0 standard drinks    Comment: occasionally  .  Drug use: Yes    Types: Marijuana    Comment: only socially  . Sexual activity: Yes  Lifestyle  . Physical activity:    Days per week: Not on file    Minutes per session: Not on file  . Stress: Not on file  Relationships  . Social connections:    Talks on phone: Not on file    Gets together: Not on file    Attends religious service: Not on file    Active member of club or organization: Not on file    Attends meetings of clubs or organizations: Not on file    Relationship status: Not on file  . Intimate partner violence:     Fear of current or ex partner: Not on file    Emotionally abused: Not on file    Physically abused: Not on file    Forced sexual activity: Not on file  Other Topics Concern  . Not on file  Social History Narrative  . Not on file   FH:  Family History  Problem Relation Age of Onset  . Heart attack Mother   . Hyperlipidemia Father   . Diabetes Brother    Current Outpatient Medications  Medication Sig Dispense Refill  . carvedilol (COREG) 25 MG tablet Take 1 tablet (25 mg total) by mouth 2 (two) times daily with a meal. 60 tablet 5  . digoxin (LANOXIN) 0.125 MG tablet Take 0.125 mg by mouth daily.    Marland Kitchen. ipratropium-albuterol (DUONEB) 0.5-2.5 (3) MG/3ML SOLN Take 3 mLs by nebulization every 4 (four) hours as needed. 360 mL 0  . magnesium oxide (MAG-OX) 400 MG tablet Take 400 mg by mouth daily.    . potassium chloride SA (K-DUR,KLOR-CON) 20 MEQ tablet Take 3 tablets (60 mEq total) by mouth every morning AND 2 tablets (40 mEq total) every evening. 150 tablet 3  . sacubitril-valsartan (ENTRESTO) 97-103 MG Take 1 tablet by mouth 2 (two) times daily. 60 tablet 3  . spironolactone (ALDACTONE) 25 MG tablet Take 1 tablet (25 mg total) by mouth daily. 90 tablet 3  . torsemide (DEMADEX) 20 MG tablet Take 80mg  (4 tab) every morning and 60mg (3 tabs) every evening. 210 tablet 5  . acetaminophen (TYLENOL) 325 MG tablet Take 1-2 tablets (325-650 mg total) by mouth every 4 (four) hours as needed for mild pain. (Patient not taking: Reported on 10/08/2018)    . albuterol (PROAIR HFA) 108 (90 Base) MCG/ACT inhaler Inhale 2 puffs into the lungs every 6 (six) hours as needed for wheezing or shortness of breath.    . dextromethorphan-guaiFENesin (MUCINEX DM) 30-600 MG 12hr tablet Take 1 tablet by mouth 2 (two) times daily as needed for cough. (Patient not taking: Reported on 10/08/2018) 30 tablet 0  . ivabradine (CORLANOR) 5 MG TABS tablet Take 1 tablet (5 mg total) by mouth 2 (two) times daily with a meal. 60  tablet 6  . traMADol (ULTRAM) 50 MG tablet Take 1 tablet (50 mg total) by mouth every 6 (six) hours as needed for moderate pain. (Patient not taking: Reported on 10/08/2018) 28 tablet 0   No current facility-administered medications for this encounter.    Vitals:   10/08/18 0937  BP: 116/60  Pulse: (!) 103  SpO2: 97%  Weight: (!) 174.7 kg (385 lb 3.2 oz)   Wt Readings from Last 3 Encounters:  10/08/18 (!) 174.7 kg (385 lb 3.2 oz)  08/25/18 (!) 170.1 kg (375 lb)  08/03/18 (!) 170.6 kg (376 lb 3.2 oz)  PHYSICAL EXAM: General: NAD, obese Neck: JVP 8-9 cm, no thyromegaly or thyroid nodule.  Lungs: Occasional wheezing CV: Nondisplaced PMI.  Heart regular S1/S2, no S3/S4, no murmur.  1+ ankle edema.  No carotid bruit.  Normal pedal pulses.  Abdomen: Soft, nontender, no hepatosplenomegaly, no distention.  Skin: Intact without lesions or rashes.  Neurologic: Alert and oriented x 3.  Psych: Normal affect. Extremities: No clubbing or cyanosis.  HEENT: Normal.   ASSESSMENT & PLAN: 1.Chronic Systolic HF:  Nonischemic cardiomyopathy.  Echo 10/17 with severely dilated LV, EF 20%, moderate-severe central MR.  Echo (7/18) with EF 15-20%.  Echo (7/19) showed EF 20-25%, moderate LV dilation, mildly decreased RV systolic function, moderate functional MR. CPX (5/19) was submaximal but concerning for severe functional impairment due to HF.  No history of heavy ETOH or cocaine.  No family history of cardiomyopathy.  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  NYHA class III symptoms today with weight gain and volume overload on exam.   - Increase torsemide to 80 mg bid x 2 days then 80 qam/60 qpm.  BMET today and again in 10 days.   - Increase KCl to 60 bid.  - Continue Entresto 97/103 bid.   - Continue coreg 25 mg BID.  - Continue spironolactone 25 mg daily.  - Continue digoxin 0.125 mg daily, check level today.  - With occasional lightheadedness, will not add Bidil yet.  -  She still has an elevated HR, will add ivabradine 5 mg bid.  Hopefully, this will help her symptomatically.   - Have previously discussed the need to prevent pregnancy with HF meds.  - Narrow QRS so not candidate for CRT upgrade.  - Based on CPX, she may be nearing the need for advanced therapies.  Obesity precludes transplant.   2. OSA: Continue CPAP.   3.Mitral regurgitation: Moderate functional MR on 7/19 echo.  4. Morbid Obesity: Body mass index is 64.1 kg/m.  - I will refer her to the weight management clinic with Dr. Dalbert Garnet.   Followup with NP/PA in 1 month.     Marca Ancona, MD  10/08/2018

## 2018-10-08 NOTE — Patient Instructions (Addendum)
Labs done today. We will call you for any abnormal labs.  Labs will need to be done in 10-14 days.  You have been referred to Dr. Dalbert Garnet with the [facility] Management [facility]. They will contact you in order to schedule an appointment.  FOR TWO DAYS ONLY: take torsemide 80mg  (4 tabs) twice daily FOR TWO DAYS ONLY. Then START Torsemide 80mg  (4 tabs) every morning and 60mg  (3 tabs) every evening.  START Potassium (1 tabs) daily  START Ivabradine 5mg  (1 tab) twice daily  Follow up with the Advanced Practice Provider in 1 month

## 2018-10-09 ENCOUNTER — Other Ambulatory Visit (HOSPITAL_COMMUNITY): Payer: Self-pay

## 2018-10-09 MED ORDER — DIGOXIN 125 MCG PO TABS: 0.1250 mg | ORAL_TABLET | Freq: Every day | ORAL | 5 refills | Status: DC

## 2018-10-22 ENCOUNTER — Other Ambulatory Visit (HOSPITAL_COMMUNITY): Payer: Medicaid Other

## 2018-10-26 ENCOUNTER — Ambulatory Visit (INDEPENDENT_AMBULATORY_CARE_PROVIDER_SITE_OTHER): Payer: Medicaid Other

## 2018-10-26 ENCOUNTER — Encounter: Payer: Self-pay | Admitting: Cardiology

## 2018-10-26 ENCOUNTER — Other Ambulatory Visit (HOSPITAL_COMMUNITY): Payer: Medicaid Other

## 2018-10-26 ENCOUNTER — Other Ambulatory Visit: Payer: Self-pay | Admitting: Cardiology

## 2018-10-26 DIAGNOSIS — I5022 Chronic systolic (congestive) heart failure: Secondary | ICD-10-CM | POA: Diagnosis not present

## 2018-10-26 DIAGNOSIS — I428 Other cardiomyopathies: Secondary | ICD-10-CM

## 2018-10-26 NOTE — Telephone Encounter (Signed)
Opened in error

## 2018-10-27 LAB — CUP PACEART REMOTE DEVICE CHECK
Battery Remaining Longevity: 128 mo
Battery Voltage: 3.02 V
Brady Statistic RV Percent Paced: 0 %
Date Time Interrogation Session: 20200208152920
HighPow Impedance: 80 Ohm
Implantable Lead Location: 753860
Implantable Pulse Generator Implant Date: 20180802
Lead Channel Impedance Value: 342 Ohm
Lead Channel Impedance Value: 475 Ohm
Lead Channel Pacing Threshold Amplitude: 0.75 V
Lead Channel Setting Pacing Amplitude: 2.5 V
Lead Channel Setting Sensing Sensitivity: 0.3 mV
MDC IDC LEAD IMPLANT DT: 20180802
MDC IDC MSMT LEADCHNL RV PACING THRESHOLD PULSEWIDTH: 0.4 ms
MDC IDC MSMT LEADCHNL RV SENSING INTR AMPL: 11.875 mV
MDC IDC MSMT LEADCHNL RV SENSING INTR AMPL: 11.875 mV
MDC IDC SET LEADCHNL RV PACING PULSEWIDTH: 0.4 ms

## 2018-10-29 ENCOUNTER — Ambulatory Visit (HOSPITAL_COMMUNITY)
Admission: RE | Admit: 2018-10-29 | Discharge: 2018-10-29 | Disposition: A | Discharge status: Home / Self Care | Payer: Medicaid Other | Source: Ambulatory Visit | Attending: Cardiology | Admitting: Cardiology

## 2018-10-29 DIAGNOSIS — I5022 Chronic systolic (congestive) heart failure: Secondary | ICD-10-CM | POA: Insufficient documentation

## 2018-10-29 LAB — BASIC METABOLIC PANEL
Anion gap: 6 (ref 5–15)
BUN: 13 mg/dL (ref 6–20)
CALCIUM: 9 mg/dL (ref 8.9–10.3)
CO2: 31 mmol/L (ref 22–32)
CREATININE: 0.88 mg/dL (ref 0.44–1.00)
Chloride: 100 mmol/L (ref 98–111)
Glucose, Bld: 156 mg/dL — ABNORMAL HIGH (ref 70–99)
Potassium: 4.5 mmol/L (ref 3.5–5.1)
Sodium: 137 mmol/L (ref 135–145)

## 2018-11-05 NOTE — Progress Notes (Signed)
Remote ICD transmission.   

## 2018-11-09 ENCOUNTER — Encounter (HOSPITAL_COMMUNITY): Payer: Self-pay

## 2018-11-09 ENCOUNTER — Ambulatory Visit (HOSPITAL_COMMUNITY)
Admission: RE | Admit: 2018-11-09 | Discharge: 2018-11-09 | Disposition: A | Discharge status: Home / Self Care | Payer: Medicaid Other | Source: Ambulatory Visit | Attending: Cardiology | Admitting: Cardiology

## 2018-11-09 VITALS — BP 102/78 | HR 96 | Wt 369.8 lb

## 2018-11-09 DIAGNOSIS — I5022 Chronic systolic (congestive) heart failure: Secondary | ICD-10-CM | POA: Diagnosis present

## 2018-11-09 DIAGNOSIS — G4730 Sleep apnea, unspecified: Secondary | ICD-10-CM

## 2018-11-09 DIAGNOSIS — G4733 Obstructive sleep apnea (adult) (pediatric): Secondary | ICD-10-CM | POA: Diagnosis not present

## 2018-11-09 DIAGNOSIS — Z8249 Family history of ischemic heart disease and other diseases of the circulatory system: Secondary | ICD-10-CM | POA: Diagnosis not present

## 2018-11-09 DIAGNOSIS — Z6841 Body Mass Index (BMI) 40.0 and over, adult: Secondary | ICD-10-CM | POA: Diagnosis not present

## 2018-11-09 DIAGNOSIS — Z95810 Presence of automatic (implantable) cardiac defibrillator: Secondary | ICD-10-CM | POA: Insufficient documentation

## 2018-11-09 DIAGNOSIS — I340 Nonrheumatic mitral (valve) insufficiency: Secondary | ICD-10-CM | POA: Diagnosis not present

## 2018-11-09 DIAGNOSIS — Z87891 Personal history of nicotine dependence: Secondary | ICD-10-CM | POA: Diagnosis not present

## 2018-11-09 DIAGNOSIS — Z79899 Other long term (current) drug therapy: Secondary | ICD-10-CM | POA: Diagnosis not present

## 2018-11-09 DIAGNOSIS — I428 Other cardiomyopathies: Secondary | ICD-10-CM | POA: Insufficient documentation

## 2018-11-09 NOTE — Patient Instructions (Signed)
You have been referred to [facility]. Annice Pih our social worker is working to get you set up there.  Stay off Corlanor  Follow up with our Advanced Practice Provider in 8 weeks.

## 2018-11-09 NOTE — Progress Notes (Signed)
[name], female   DOB: 1981/05/30, 38 y.o.   MRN: [id number]    [facility] Note   PCP: None  Primary HF Cardiologist: Dr Shirlee Latch   HPI: Kirsten Wheeler is a 38 y.o. 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   Admitted 7/27 - 04/22/17 with near-syncope and volume overload. Echo showed reduction in EF from previous.  Event concerning for VT so ICD placed 04/17/17. Post-op course complicated by pleuritic chest pain thought to be due to ICD microperforation of cardiac tissue. Taken for lead revision 04/21/17 with improvement.  Overall pt diuresed 16.8L though weights were inaccurate. Discharge weight 348 lbs.   Echo 04/13/17  LVEF 15-20%, Severe LV dilation, Mod LAE, Mild RAE.  CPX in 5/19 was submaximal but appeared to show severe functional impairment from HF. Echo was in 7/19 showing moderate LV dilation with EF 20-25%, RV normal size with mildly decreased systolic function, moderate central MR.    Today she returns for HF follow up. Overall feeling fine. Mild dyspnea with exertion and steps. Denies PND/Orthopnea. Appetite ok. Tries to follow low salt diet. No fever or chills. Weight at home has been going down. Not exercising. Using CPAP. Taking all medications but she does not want to take corlanor. Lives with her 2 daughters. Requires assistance with transportation.    Labs (12/19): K 3.4, creatinine 1.16 Labs (10/29/18): K 4.5 Creatinine 0.88   PMH:  1. Chronic systolic CHF: Nonischemic cardiomyopathy.  No family history of cardiomyopathy, no heavy ETOH, cocaine, or amphetamines.  Medtronic ICD.  - Echo (12/16) with EF 20-25%, moderate MR.  - Echo (5/17) with severely dilated LV, EF 10-15%, severe functional MR, mildly decreased RV systolic function. - LHC/RHC (6/59): No significant coronary disease; mean RA 15, PA 61/25 mean 41, mean PCWP 18, CI 1.7 (Fick), PVR 5.4 WU.  - Echo (10/17): EF 20%,  severely dilated LV, normal RV size with mildly decreased systolic function, moderate-severe central MR.  - Echo 04/13/17  LVEF 15-20%, Severe LV dilation, Mod LAE, Mild RAE - CPX (5/19): RER 0.95, peak VO2 9.8, VE/VCO2 26 => submaximal but likely severe functional impairement.  - Echo (7/19): EF 20-25%, moderate LV dilation, RV normal size with mildly decreased systolic function, moderate central MR.  2. Morbid obesity.  3. 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  Echo (10/17) with moderate-severe MR. Echo (7/19) with moderate central MR.  4. OSA: CPAP.   Review of systems complete and found to be negative unless listed in HPI.   SH:  Social History   Socioeconomic History  . Marital status: Single    Spouse name: Not on file  . Number of children: 2  . Years of education: Not on file  . Highest education level: Not on file  Occupational History  . Not on file  Social Needs  . Financial resource strain: Not very hard  . Food insecurity:    Worry: Never true    Inability: Never true  . Transportation needs:    Medical: Yes    Non-medical: Yes  Tobacco Use  . Smoking status: Former Smoker    Last attempt to quit: 10/19/2015    Years since quitting: 3.0  . Smokeless tobacco: Never Used  Substance and Sexual Activity  . Alcohol use: Yes    Alcohol/week: 0.0 standard drinks    Comment: occasionally  . Drug use: Yes    Types: Marijuana    Comment:  only socially  . Sexual activity: Yes  Lifestyle  . Physical activity:    Days per week: Not on file    Minutes per session: Not on file  . Stress: Not on file  Relationships  . Social connections:    Talks on phone: Not on file    Gets together: Not on file    Attends religious service: Not on file    Active member of club or organization: Not on file    Attends meetings of clubs or organizations: Not on file    Relationship status: Not on file  . Intimate partner violence:    Fear of current or ex partner: Not  on file    Emotionally abused: Not on file    Physically abused: Not on file    Forced sexual activity: Not on file  Other Topics Concern  . Not on file  Social History Narrative  . Not on file   FH:  Family History  Problem Relation Age of Onset  . Heart attack Mother   . Hyperlipidemia Father   . Diabetes Brother    Current Outpatient Medications  Medication Sig Dispense Refill  . acetaminophen (TYLENOL) 325 MG tablet Take 1-2 tablets (325-650 mg total) by mouth every 4 (four) hours as needed for mild pain.    Marland Kitchen albuterol (PROAIR HFA) 108 (90 Base) MCG/ACT inhaler Inhale 2 puffs into the lungs every 6 (six) hours as needed for wheezing or shortness of breath.    . carvedilol (COREG) 25 MG tablet Take 1 tablet (25 mg total) by mouth 2 (two) times daily with a meal. 60 tablet 5  . dextromethorphan-guaiFENesin (MUCINEX DM) 30-600 MG 12hr tablet Take 1 tablet by mouth 2 (two) times daily as needed for cough. 30 tablet 0  . digoxin (LANOXIN) 0.125 MG tablet Take 1 tablet (0.125 mg total) by mouth daily. 30 tablet 5  . ipratropium-albuterol (DUONEB) 0.5-2.5 (3) MG/3ML SOLN Take 3 mLs by nebulization every 4 (four) hours as needed. 360 mL 0  . potassium chloride SA (K-DUR,KLOR-CON) 20 MEQ tablet Take 3 tablets (60 mEq total) by mouth every morning AND 2 tablets (40 mEq total) every evening. 150 tablet 3  . sacubitril-valsartan (ENTRESTO) 97-103 MG Take 1 tablet by mouth 2 (two) times daily. 60 tablet 3  . spironolactone (ALDACTONE) 25 MG tablet Take 1 tablet (25 mg total) by mouth daily. 90 tablet 3  . torsemide (DEMADEX) 20 MG tablet Take 80mg  (4 tab) every morning and 60mg (3 tabs) every evening. 210 tablet 5  . traMADol (ULTRAM) 50 MG tablet Take 1 tablet (50 mg total) by mouth every 6 (six) hours as needed for moderate pain. (Patient not taking: Reported on 11/09/2018) 28 tablet 0   No current facility-administered medications for this encounter.    Vitals:   11/09/18 1025  BP: 102/78    Pulse: 96  SpO2: 96%  Weight: (!) 167.7 kg (369 lb 12.8 oz)   Wt Readings from Last 3 Encounters:  11/09/18 (!) 167.7 kg (369 lb 12.8 oz)  10/08/18 (!) 174.7 kg (385 lb 3.2 oz)  08/25/18 (!) 170.1 kg (375 lb)    PHYSICAL EXAM: General:  Well appearing. No resp difficulty HEENT: normal Neck: supple. JVP 5-6 . Carotids 2+ bilat; no bruits. No lymphadenopathy or thryomegaly appreciated. Cor: PMI nondisplaced. Regular rate & rhythm. No rubs, gallops or murmurs. Lungs: clear Abdomen: obese, soft, nontender, nondistended. No hepatosplenomegaly. No bruits or masses. Good bowel sounds. Extremities: no cyanosis, clubbing, rash,  edema Neuro: alert & orientedx3, cranial nerves grossly intact. moves all 4 extremities w/o difficulty. Affect pleasant  ASSESSMENT & PLAN: 1.Chronic Systolic HF:  Nonischemic cardiomyopathy.  Echo 10/17 with severely dilated LV, EF 20%, moderate-severe central MR.  Echo (7/18) with EF 15-20%.  Echo (7/19) showed EF 20-25%, moderate LV dilation, mildly decreased RV systolic function, moderate functional MR. CPX (5/19) was submaximal but concerning for severe functional impairment due to HF.  No history of heavy ETOH or cocaine.  No family history of cardiomyopathy.  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   NYHA II-III. Volume status stable. Continue torsemide 80 qam/60 qpm.  I reviewed recent BMET and this was stable.  - Continue Entresto 97/103 bid.   - Continue coreg 25 mg BID.  - Continue spironolactone 25 mg daily.  - Continue digoxin 0.125 mg daily - She does not want to take corlanor. For now hold off. Heart rate in the 90s.  - No room to add bidil with soft SBP.  - Have previously discussed the need to prevent pregnancy with HF meds.  - Narrow QRS so not candidate for CRT upgrade.  - Based on CPX, she may be nearing the need for advanced therapies.  Today she was referred to [facility] PREP program.  Obesity precludes transplant.   2. OSA:  Continue CPAP.   3.Mitral regurgitation: Moderate functional MR on 7/19 echo.  4. Morbid Obesity: Body mass index is 61.54 kg/m.  Referred to the [facility] PREP program.   Follow up in 2 months. Referred to SW for assistance for PCP.     Tonye Becket, NP  11/09/2018

## 2018-11-09 NOTE — Addendum Note (Signed)
Encounter addended by: Marcy Siren, LCSW on: 11/09/2018 5:13 PM  Actions taken: Clinical Note Signed

## 2018-11-09 NOTE — Progress Notes (Signed)
CSW met with patient in the clinic. Patient shared she is continuing her education and doing well in school. Patient was in great spirits and really motivated for improved health. CSW provided supportive intervention and will continue to follow for supportive needs. Raquel Sarna, [place], [place]

## 2018-11-19 ENCOUNTER — Telehealth (HOSPITAL_COMMUNITY): Payer: Self-pay | Admitting: Licensed Clinical Social Worker

## 2018-11-19 NOTE — Telephone Encounter (Signed)
CSW called pt to discuss [facility] on 12/02/18.  Pt very motivated to come to group and states she will definitely be there and plans to help with ideas for group topics.  Pt also reports she recently got a car and so now transportation will not be a concern for her.  CSW will continue to follow and assist as needed  Burna Sis, LCSW Clinical Social Worker [facility] [phone]

## 2018-12-03 ENCOUNTER — Telehealth (HOSPITAL_COMMUNITY): Payer: Self-pay | Admitting: Licensed Clinical Social Worker

## 2018-12-03 NOTE — Telephone Encounter (Signed)
Patient reached out to CSW regarding need for mask and or gloves so that she can safely travel around the community in order to get food.  CSW explained that masks and gloves are in high demand at this time and we cannot take them away from the healthcare setting.  CSW then spoke with pt about safe ways to get groceries- discussed pick up at local stores or deliveries and sent list of good programs being supplied by schools at this time as patient has 2 children.  CSW assisted in calling [employer]- per their away message they are only open until 12pm today and will be closed tomorrow (reopening Monday at 9:30am)- information relayed to patient.  Patient going to [employer] for today- they are currently only allowing limited numbers in to assist with social distancing and patient endorses knowledge of hygiene and need to avoid touch her face and washing her hands frequently- pt will plan to remain at home as much as possible following this trip to [employer] to stock up on food.  CSW will continue to follow and assist as needed  Burna Sis, LCSW Clinical Social Worker [facility] [phone]

## 2018-12-04 ENCOUNTER — Telehealth (HOSPITAL_COMMUNITY): Payer: Self-pay | Admitting: Licensed Clinical Social Worker

## 2018-12-04 NOTE — Telephone Encounter (Addendum)
CSW contacted patient to inquire about food insecurity during this health crisis. Patient reported need and agreeable to weekly delivery from [employer]. Patient informed that delivery will be left on front door with no face to face contact with delivery person. Patient agreeable to plan and grateful for the assistance. Jackie Neeka Urista, LCSW, CCSW-MCS [phone] 

## 2018-12-14 ENCOUNTER — Telehealth (HOSPITAL_COMMUNITY): Payer: Self-pay | Admitting: Licensed Clinical Social Worker

## 2018-12-14 NOTE — Telephone Encounter (Signed)
CSW contacted patient to follow up on weekly food delivery package. Patient informed of delivery time and no face to face contact during delivery. Message left as no answer.  CSW continues to follow for supportive needs. Jackie Maxon Kresse, LCSW, CCSW-MCS [phone] 

## 2018-12-18 ENCOUNTER — Telehealth (HOSPITAL_COMMUNITY): Payer: Self-pay | Admitting: Licensed Clinical Social Worker

## 2018-12-18 NOTE — Telephone Encounter (Signed)
CSW contacted patient to follow up on weekly food delivery package. Patient informed of delivery time and no face to face contact during delivery. Patient verbalizes understanding and grateful for the assistance.  CSW continues to follow for supportive needs. Jackie Lennan Malone, LCSW, CCSW-MCS [phone]  

## 2019-01-01 ENCOUNTER — Telehealth (HOSPITAL_COMMUNITY): Payer: Self-pay | Admitting: Licensed Clinical Social Worker

## 2019-01-01 NOTE — Telephone Encounter (Signed)
CSW contacted patient to follow up on weekly food delivery package. Patient informed of delivery time and no face to face contact during delivery. Patient verbalizes understanding and grateful for the assistance.  CSW continues to follow for supportive needs. Jackie Epsie Walthall, LCSW, CCSW-MCS [phone]  

## 2019-01-04 ENCOUNTER — Ambulatory Visit (HOSPITAL_COMMUNITY)
Admission: RE | Admit: 2019-01-04 | Discharge: 2019-01-04 | Disposition: A | Discharge status: Home / Self Care | Payer: Medicaid Other | Source: Ambulatory Visit | Attending: Adult Health | Admitting: Adult Health

## 2019-01-04 ENCOUNTER — Other Ambulatory Visit: Payer: Self-pay

## 2019-01-04 DIAGNOSIS — I5022 Chronic systolic (congestive) heart failure: Secondary | ICD-10-CM | POA: Diagnosis not present

## 2019-01-04 DIAGNOSIS — G4730 Sleep apnea, unspecified: Secondary | ICD-10-CM

## 2019-01-04 NOTE — Addendum Note (Signed)
Encounter addended by: Marisa Hua, RN on: 01/04/2019 1:12 PM  Actions taken: Order list changed, Diagnosis association updated, Clinical Note Signed

## 2019-01-04 NOTE — Patient Instructions (Addendum)
1. Can take extra 20 mg torsemide as needed for SOB, swelling in abdomen or lower extremities.   2. Repeat Cardiopulmonary Exercise test in June. You will be called to schedule this appointment.  Your physician has recommended that you have a cardiopulmonary stress test (CPX). CPX testing is a non-invasive measurement of heart and lung function. It replaces a traditional treadmill stress test. This type of test provides a tremendous amount of information that relates not only to your present condition but also for future outcomes. This test combines measurements of you ventilation, respiratory gas exchange in the lungs, electrocardiogram (EKG), blood pressure and physical response before, during, and following an exercise protocol.   3. You are being referred to home cardiac rehab.  [facility] will contacted by [facility] to schedule this visit.   4. Follow up in 2 months with APP

## 2019-01-04 NOTE — Progress Notes (Addendum)
Heart Failure TeleHealth Note  Due to national recommendations of social distancing due to COVID 19, telehealth visit is felt to be most appropriate for this patient at this time. I discussed the limitations, risks, security and privacy concerns of performing an evaluation and management service by telephone and the availability of in person appointments. I also discussed with the patient that there may be a patient responsible charge related to this service. The patient expressed understanding and agreed to proceed.   Kirsten Wheeler, DOB October 21, 1980, MRN [id number]  Location: Home  Provider location: [address] [place] Type of Visit: Established patient   PCP:  Eunice Blase, PA-C  Primary HF: Dr Shirlee Latch  Chief Complaint: HF follow up   History of Present Illness: Kirsten Wheeler is a 38 y.o. 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   Echo 04/13/17  LVEF 15-20%, Severe LV dilation, Mod LAE, Mild RAE.  CPX in 5/19: Submax aerobic test but stopped due to patient reaching her ventilatory limits. There is a severe functional limitation due to patient's obesity and restricted ventilation. There is no significant HF limitation seen.   Seen in HF clinic 11/09/18. She was doing fine. She was referred to [facility] program. HF CSW helped her establish with PCP. Weight was 369 lbs.   She presents via Special educational needs teacher for a telehealth visit today. Overall doing fine. Only complaint is low back pain that occurs right before taking torsemide. Great UOP with torsemide. Some SOB with stairs, lives on 2nd floor. No problems with flat ground. No edema or bloating. Chronic orthopnea. Wearing CPAP qHS. Has chronic dry cough. No fever or chills. No dizziness. Appetite is poor. A few days ago had an episode of CP that improved with taking a a deep breath. Taking torsemide 80 mg am, 40 mg pm  instead of prescribed 80 mg am/60 mg pm. Taking all other medications as prescribed. Weight is up, but stable per patient at 380 lbs.   Optivol: thoracic impedence below threshold. Volume is up and down over the last few months. A few short runs of NSVT.  Pt denies symptoms of cough, fevers, chills, or new SOB worrisome for COVID 19.    Past Medical History:  Diagnosis Date  . Asthma   . Chronic systolic CHF (congestive heart failure) (HCC)    a. ECHO 01/15/2016 EF 10-15%. 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  Marland Kitchen Hearing loss    bilateral  . Mitral regurgitation    a. severe, felt to be functional 2/2 LV dilation   . Morbid obesity (HCC)   . Snoring    Past Surgical History:  Procedure Laterality Date  . CARDIAC CATHETERIZATION N/A 01/18/2016   Procedure: Right/Left Heart Cath and Coronary Angiography;  Surgeon: Laurey Morale, MD;  Location: [facility] INVASIVE CV LAB;  Service: Cardiovascular;  Laterality: N/A;  . CESAREAN SECTION    . ICD IMPLANT N/A 04/17/2017   Procedure: ICD Implant;  Surgeon: Regan Lemming, MD;  Location: [facility] INVASIVE CV LAB;  Service: Cardiovascular;  Laterality: N/A;  . LEAD REVISION/REPAIR N/A 04/21/2017   Procedure: Lead Revision/Repair;  Surgeon: Regan Lemming, MD;  Location: [facility] INVASIVE CV LAB;  Service: Cardiovascular;  Laterality: N/A;  . TONSILLECTOMY       Current Outpatient Medications  Medication Sig Dispense Refill  . carvedilol (COREG) 25 MG tablet  Take 1 tablet (25 mg total) by mouth 2 (two) times daily with a meal. 60 tablet 5  . digoxin (LANOXIN) 0.125 MG tablet Take 1 tablet (0.125 mg total) by mouth daily. 30 tablet 5  . potassium chloride SA (K-DUR,KLOR-CON) 20 MEQ tablet Take 3 tablets (60 mEq total) by mouth every morning AND 2 tablets (40 mEq total) every evening. 150 tablet 3  . sacubitril-valsartan (ENTRESTO) 97-103 MG Take 1 tablet by mouth 2 (two) times daily. 60 tablet 3  . spironolactone (ALDACTONE) 25 MG tablet Take 1 tablet  (25 mg total) by mouth daily. 90 tablet 3  . torsemide (DEMADEX) 20 MG tablet Take  (4 tab) every morning and (3 tabs) every evening. (Patient taking differently: Take  (4 tab) every morning and (2 tabs) every evening.) 210 tablet 5  . acetaminophen (TYLENOL) 325 MG tablet Take 1-2 tablets (325-650 mg total) by mouth every 4 (four) hours as needed for mild pain.    Marland Kitchen albuterol (PROAIR HFA) 108 (90 Base) MCG/ACT inhaler Inhale 2 puffs into the lungs every 6 (six) hours as needed for wheezing or shortness of breath.    . dextromethorphan-guaiFENesin (MUCINEX DM) 30-600 MG 12hr tablet Take 1 tablet by mouth 2 (two) times daily as needed for cough. 30 tablet 0  . ipratropium-albuterol (DUONEB) 0.5-2.5 (3) MG/3ML SOLN Take 3 mLs by nebulization every 4 (four) hours as needed. 360 mL 0  . traMADol (ULTRAM) 50 MG tablet Take 1 tablet (50 mg total) by mouth every 6 (six) hours as needed for moderate pain. (Patient not taking: Reported on 11/09/2018) 28 tablet 0   No current facility-administered medications for this encounter.     Allergies:   Patient has no known allergies.   Social History:  The patient  reports that she quit smoking about 3 years ago. She has never used smokeless tobacco. She reports current alcohol use. She reports current drug use. Drug: Marijuana.   Family History:  The patient's family history includes Diabetes in her brother; Heart attack in her mother; Hyperlipidemia in her father.   ROS:  Please see the history of present illness.   All other systems are personally reviewed and negative.   Exam:  [facility] Call) Lungs: Normal respiratory effort with conversation.  Neuro: Alert & oriented x 3.  Denies edema or abdominal bloating.   Recent Labs: 06/08/2018: Hemoglobin 13.7; Magnesium 1.7; Platelets 291 08/25/2018: B Natriuretic Peptide 126.8 10/29/2018: BUN 13; Creatinine, Ser 0.88; Potassium 4.5; Sodium 137  Personally reviewed   Wt Readings from Last 3  Encounters:  11/09/18 (!) 167.7 kg (369 lb 12.8 oz)  10/08/18 (!) 174.7 kg (385 lb 3.2 oz)  08/25/18 (!) 170.1 kg (375 lb)      ASSESSMENT AND PLAN:  1.Chronic Systolic HF:  Nonischemic cardiomyopathy.  Echo 10/17 with severely dilated LV, EF 20%, moderate-severe central MR.  Echo (7/18) with EF 15-20%.  Echo (7/19) showed EF 20-25%, moderate LV dilation, mildly decreased RV systolic function, moderate functional MR. CPX (5/19) was submaximal but most limitation appears to be due to body habitus and lung restriction.  No history of heavy ETOH or cocaine.  No family history of cardiomyopathy.  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   - NYHA II-III. Volume status sounds okay. Looks okay on Optivol. - Continue torsemide 80 qam/40 qpm. Can take extra 20 mg PRN. - Continue Entresto 97/103 bid.   - Continue coreg 25 mg BID.  - Continue spironolactone 25 mg daily.  -  Continue digoxin 0.125 mg daily - She does not want to take corlanor. For now hold off. Heart rate in the 90s.  - No room to add bidil with soft SBP.  - Have previously discussed the need to prevent pregnancy with HF meds.  - Narrow QRS so not candidate for CRT upgrade.  - Obesity precludes transplant.   - Refer to cardiac rehab at home.  2. OSA: Continue CPAP.  No change.  3.Mitral regurgitation: Moderate functional MR on 7/19 echo. No change.  4. Morbid Obesity:  Refer to home cardiac rehab today.  5. Chest pain - Has had a few episodes of chest tightness that comes on suddenly and improves with deep breaths. Thinks it could be gas pains. She had a LHC in 2017, which showed no coronary disease. Will monitor for now. She will let us know if this becomes more severe or more frequent.    COVID screen The patient does not have any symptoms that suggest any further testing/ screening at this time.  Social distancing reinforced today. Goes out for groceries and wears mask.   Patient Risk: After full  review of this patients clinical status, I feel that they are at moderate risk for cardiac decompensation at this time.  Orders/Follow up: Can take extra 20 mg torsemide PRN. Repeat CPX in June. Refer to home cardiac rehab. Follow up in 2 months.   Today, I have spent 15 minutes with the patient with telehealth technology discussing the above issues.     Signed, Alford Highland, NP  01/04/2019 11:21 AM   [facility] [address] [facility] [place] [address] [phone] (office) [phone] (fax)

## 2019-01-04 NOTE — Progress Notes (Signed)
Spoke with patient, discussed AVS. Questions answered. Referral to [facility] sent.

## 2019-01-07 DIAGNOSIS — I5022 Chronic systolic (congestive) heart failure: Secondary | ICD-10-CM | POA: Diagnosis not present

## 2019-01-08 ENCOUNTER — Telehealth (HOSPITAL_COMMUNITY): Payer: Self-pay | Admitting: Licensed Clinical Social Worker

## 2019-01-08 NOTE — Telephone Encounter (Signed)
CSW contacted patient to follow up on weekly food delivery package. Patient informed of delivery time and no face to face contact during delivery. Patient verbalizes understanding and grateful for the assistance.  CSW continues to follow for supportive needs. Jackie Vilda Zollner, LCSW, CCSW-MCS [phone]  

## 2019-01-14 ENCOUNTER — Telehealth (HOSPITAL_COMMUNITY): Payer: Self-pay | Admitting: Cardiology

## 2019-01-14 NOTE — Telephone Encounter (Signed)
Verbal order given the Angel,RN with [facility] to begin skilled nursing for chf management, enroll in CHF diuretic protocol and PT evaluation. S/P HF hospitalization

## 2019-01-15 ENCOUNTER — Telehealth (HOSPITAL_COMMUNITY): Payer: Self-pay | Admitting: Licensed Clinical Social Worker

## 2019-01-15 NOTE — Telephone Encounter (Signed)
CSW contacted patient to follow up on weekly food delivery package. Patient informed of delivery time and no face to face contact during delivery. Message left as no answer.  CSW continues to follow for supportive needs. Jackie Marzetta Lanza, LCSW, CCSW-MCS [phone] 

## 2019-01-18 ENCOUNTER — Telehealth (HOSPITAL_COMMUNITY): Payer: Self-pay | Admitting: Licensed Clinical Social Worker

## 2019-01-18 NOTE — Telephone Encounter (Signed)
CSW received call from pt requested assistance with a letter from the MD.  Pt states that she has been getting help with getting groceries and other activities that would expose her to the virus by a friend who has been staying with her several nights a week.  Her apartment complex is requesting a letter from the MD explaining why this would be beneficial to the patient.  CSW typed up a letter for MD to review and sign if appropriate and sent into clinic for consideration.  CSW will continue to follow and assist as needed  Burna Sis, LCSW Clinical Social Worker [facility] Desk#: [phone] Cell#: [phone]

## 2019-01-21 ENCOUNTER — Telehealth (HOSPITAL_COMMUNITY): Payer: Self-pay | Admitting: Cardiology

## 2019-01-21 ENCOUNTER — Encounter (HOSPITAL_COMMUNITY): Payer: Self-pay

## 2019-01-21 NOTE — Telephone Encounter (Signed)
Original letter to be mailed to pt.

## 2019-01-21 NOTE — Telephone Encounter (Signed)
Verbal orders given to Rosanne Ashing with [facility] to proceed with in home PT [phone])

## 2019-01-22 ENCOUNTER — Telehealth (HOSPITAL_COMMUNITY): Payer: Self-pay | Admitting: Licensed Clinical Social Worker

## 2019-01-22 NOTE — Telephone Encounter (Signed)
CSW contacted patient to follow up on weekly food delivery package. Patient informed of delivery time and no face to face contact during delivery. Patient verbalizes understanding and grateful for the assistance.  CSW continues to follow for supportive needs. Jackie Luceal Hollibaugh, LCSW, CCSW-MCS [phone]  

## 2019-01-25 ENCOUNTER — Ambulatory Visit (INDEPENDENT_AMBULATORY_CARE_PROVIDER_SITE_OTHER): Payer: Medicaid Other | Admitting: *Deleted

## 2019-01-25 ENCOUNTER — Other Ambulatory Visit: Payer: Self-pay

## 2019-01-25 ENCOUNTER — Telehealth: Payer: Self-pay

## 2019-01-25 DIAGNOSIS — I5022 Chronic systolic (congestive) heart failure: Secondary | ICD-10-CM

## 2019-01-25 DIAGNOSIS — I428 Other cardiomyopathies: Secondary | ICD-10-CM

## 2019-01-25 NOTE — Telephone Encounter (Signed)
Spoke with patient to remind of missed remote transmission 

## 2019-01-26 LAB — CUP PACEART REMOTE DEVICE CHECK
Battery Remaining Longevity: 126 mo
Battery Voltage: 3.02 V
Brady Statistic RV Percent Paced: 0.01 %
Date Time Interrogation Session: 20200511194844
HighPow Impedance: 75 Ohm
Implantable Lead Implant Date: 20180802
Implantable Lead Location: 753860
Implantable Pulse Generator Implant Date: 20180802
Lead Channel Impedance Value: 342 Ohm
Lead Channel Impedance Value: 475 Ohm
Lead Channel Pacing Threshold Amplitude: 0.75 V
Lead Channel Pacing Threshold Pulse Width: 0.4 ms
Lead Channel Sensing Intrinsic Amplitude: 9.375 mV
Lead Channel Sensing Intrinsic Amplitude: 9.375 mV
Lead Channel Setting Pacing Amplitude: 2.5 V
Lead Channel Setting Pacing Pulse Width: 0.4 ms
Lead Channel Setting Sensing Sensitivity: 0.3 mV

## 2019-01-29 ENCOUNTER — Telehealth (HOSPITAL_COMMUNITY): Payer: Self-pay | Admitting: Licensed Clinical Social Worker

## 2019-01-29 NOTE — Telephone Encounter (Signed)
CSW contacted patient to follow up on weekly food delivery package. Patient informed of delivery time and no face to face contact during delivery. Message left as no answer.  CSW continues to follow for supportive needs. Jackie Armie Moren, LCSW, CCSW-MCS [phone] 

## 2019-02-02 NOTE — Progress Notes (Signed)
Remote ICD transmission.   

## 2019-02-04 ENCOUNTER — Telehealth (HOSPITAL_COMMUNITY): Payer: Self-pay | Admitting: Licensed Clinical Social Worker

## 2019-02-04 NOTE — Telephone Encounter (Signed)
CSW contacted patient to follow up on weekly food delivery package. Patient informed of delivery time and no face to face contact during delivery. Message left as no answer.  CSW continues to follow for supportive needs. Jackie Tabari Volkert, LCSW, CCSW-MCS [phone] 

## 2019-02-12 ENCOUNTER — Telehealth (HOSPITAL_COMMUNITY): Payer: Self-pay | Admitting: Licensed Clinical Social Worker

## 2019-02-12 NOTE — Telephone Encounter (Signed)
CSW contacted patient to follow up on weekly food delivery package. Patient informed of delivery time and no face to face contact during delivery. CSW shared transition option for food delivery as the Covid 19 Food relief program will be ending on February 26, 2019. Message left as no answer.  CSW continues to follow for supportive needs. Jackie Rafiel Mecca, LCSW, CCSW-MCS [phone] 

## 2019-02-18 ENCOUNTER — Telehealth (HOSPITAL_COMMUNITY): Payer: Self-pay | Admitting: Licensed Clinical Social Worker

## 2019-02-18 NOTE — Telephone Encounter (Signed)
CSW contacted patient to follow up on weekly food delivery package. Patient informed of delivery time and no face to face contact during delivery. CSW shared transition option for food delivery as the Covid 19 Food relief program will be ending on February 22, 2019. Message left as no answer.  CSW continues to follow for supportive needs. Jackie Anniya Whiters, LCSW, CCSW-MCS [phone] 

## 2019-02-23 ENCOUNTER — Encounter (HOSPITAL_COMMUNITY): Payer: Medicaid Other

## 2019-03-03 ENCOUNTER — Other Ambulatory Visit (HOSPITAL_COMMUNITY): Payer: Self-pay

## 2019-03-03 MED ORDER — ENTRESTO 97-103 MG PO TABS: 1.0000 | ORAL_TABLET | Freq: Two times a day (BID) | ORAL | 11 refills | Status: DC

## 2019-03-10 ENCOUNTER — Encounter (HOSPITAL_COMMUNITY): Payer: Medicaid Other

## 2019-03-31 ENCOUNTER — Ambulatory Visit: Payer: Medicaid Other | Admitting: Obstetrics & Gynecology

## 2019-03-31 ENCOUNTER — Other Ambulatory Visit (HOSPITAL_COMMUNITY): Payer: Self-pay

## 2019-03-31 ENCOUNTER — Other Ambulatory Visit (HOSPITAL_COMMUNITY): Payer: Self-pay | Admitting: Cardiology

## 2019-03-31 DIAGNOSIS — I5022 Chronic systolic (congestive) heart failure: Secondary | ICD-10-CM

## 2019-03-31 MED ORDER — SPIRONOLACTONE 25 MG PO TABS: 25.0000 mg | ORAL_TABLET | Freq: Every day | ORAL | 3 refills | Status: DC

## 2019-04-01 ENCOUNTER — Telehealth (HOSPITAL_COMMUNITY): Payer: Self-pay | Admitting: Adult Health

## 2019-04-01 NOTE — Telephone Encounter (Signed)
LVM for PT

## 2019-04-02 ENCOUNTER — Telehealth (HOSPITAL_COMMUNITY): Payer: Self-pay

## 2019-04-02 NOTE — Telephone Encounter (Signed)
Plan of care signed, to be picked up by dan of [facility] 

## 2019-04-20 ENCOUNTER — Other Ambulatory Visit: Payer: Self-pay

## 2019-04-20 ENCOUNTER — Ambulatory Visit (HOSPITAL_COMMUNITY)
Admission: RE | Admit: 2019-04-20 | Discharge: 2019-04-20 | Disposition: A | Discharge status: Home / Self Care | Payer: Medicaid Other | Source: Ambulatory Visit | Attending: Cardiology | Admitting: Cardiology

## 2019-04-20 ENCOUNTER — Encounter (HOSPITAL_COMMUNITY): Payer: Self-pay

## 2019-04-20 VITALS — BP 114/86 | HR 100 | Wt 367.8 lb

## 2019-04-20 DIAGNOSIS — Z87891 Personal history of nicotine dependence: Secondary | ICD-10-CM | POA: Insufficient documentation

## 2019-04-20 DIAGNOSIS — E119 Type 2 diabetes mellitus without complications: Secondary | ICD-10-CM | POA: Diagnosis not present

## 2019-04-20 DIAGNOSIS — Z95810 Presence of automatic (implantable) cardiac defibrillator: Secondary | ICD-10-CM | POA: Diagnosis not present

## 2019-04-20 DIAGNOSIS — Z7984 Long term (current) use of oral hypoglycemic drugs: Secondary | ICD-10-CM | POA: Insufficient documentation

## 2019-04-20 DIAGNOSIS — I5022 Chronic systolic (congestive) heart failure: Secondary | ICD-10-CM | POA: Insufficient documentation

## 2019-04-20 DIAGNOSIS — I340 Nonrheumatic mitral (valve) insufficiency: Secondary | ICD-10-CM | POA: Diagnosis not present

## 2019-04-20 DIAGNOSIS — Z8249 Family history of ischemic heart disease and other diseases of the circulatory system: Secondary | ICD-10-CM | POA: Insufficient documentation

## 2019-04-20 DIAGNOSIS — G4730 Sleep apnea, unspecified: Secondary | ICD-10-CM | POA: Diagnosis not present

## 2019-04-20 DIAGNOSIS — Z8349 Family history of other endocrine, nutritional and metabolic diseases: Secondary | ICD-10-CM | POA: Insufficient documentation

## 2019-04-20 DIAGNOSIS — G4733 Obstructive sleep apnea (adult) (pediatric): Secondary | ICD-10-CM | POA: Insufficient documentation

## 2019-04-20 DIAGNOSIS — Z833 Family history of diabetes mellitus: Secondary | ICD-10-CM | POA: Diagnosis not present

## 2019-04-20 DIAGNOSIS — Z6841 Body Mass Index (BMI) 40.0 and over, adult: Secondary | ICD-10-CM | POA: Diagnosis not present

## 2019-04-20 DIAGNOSIS — I428 Other cardiomyopathies: Secondary | ICD-10-CM | POA: Diagnosis not present

## 2019-04-20 DIAGNOSIS — Z79899 Other long term (current) drug therapy: Secondary | ICD-10-CM | POA: Diagnosis not present

## 2019-04-20 DIAGNOSIS — Z720 Tobacco use: Secondary | ICD-10-CM

## 2019-04-20 LAB — BASIC METABOLIC PANEL
Anion gap: 13 (ref 5–15)
BUN: 16 mg/dL (ref 6–20)
CO2: 26 mmol/L (ref 22–32)
Calcium: 9.7 mg/dL (ref 8.9–10.3)
Chloride: 95 mmol/L — ABNORMAL LOW (ref 98–111)
Creatinine, Ser: 0.94 mg/dL (ref 0.44–1.00)
GFR calc Af Amer: >60 mL/min (ref >60)
GFR calc non Af Amer: >60 mL/min (ref >60)
Glucose, Bld: 370 mg/dL — ABNORMAL HIGH (ref 70–99)
Potassium: 3.4 mmol/L — ABNORMAL LOW (ref 3.5–5.1)
Sodium: 134 mmol/L — ABNORMAL LOW (ref 135–145)

## 2019-04-20 NOTE — Progress Notes (Signed)
[name], female   DOB: 06/10/1981, 38 y.o.   MRN: [id number]    Advanced [facility] Failure Clinic Note   [facility] Cardiologist: Dr Shirlee LatchMcLean   HPI: Kirsten Wheeler is a 38 y.o. 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   Admitted 7/27 - 04/22/17 with near-syncope and volume overload. Echo showed reduction in EF from previous.  Event concerning for VT so ICD placed 04/17/17. Post-op course complicated by pleuritic chest pain thought to be due to ICD microperforation of cardiac tissue. Taken for lead revision 04/21/17 with improvement.  Overall pt diuresed 16.8L though weights were inaccurate. Discharge weight 348 lbs.   Echo 04/13/17  LVEF 15-20%, Severe LV dilation, Mod LAE, Mild RAE.  CPX in 5/19 was submaximal but appeared to show severe functional impairment from HF. Echo was in 7/19 showing moderate LV dilation with EF 20-25%, RV normal size with mildly decreased systolic function, moderate central MR.    Today she returns for HF follow up. Overall feeling fine. Denies SOB/PND/Orthopnea. Walking 1.2 miles 4-5 days a week. Appetite ok. No fever or chills. Weight at home 372 pounds. Using CPAP. Taking all medications. Smoking 1-2 every other day.     Labs (12/19): K 3.4, creatinine 1.16 Labs (10/29/18): K 4.5 Creatinine 0.88   PMH:  1. Chronic systolic CHF: Nonischemic cardiomyopathy.  No family history of cardiomyopathy, no heavy ETOH, cocaine, or amphetamines.  Medtronic ICD.  - Echo (12/16) with EF 20-25%, moderate MR.  - Echo (5/17) with severely dilated LV, EF 10-15%, severe functional MR, mildly decreased RV systolic function. - LHC/RHC (4/095/17): No significant coronary disease; mean RA 15, PA 61/25 mean 41, mean PCWP 18, CI 1.7 (Fick), PVR 5.4 WU.  - Echo (10/17): EF 20%, severely dilated LV, normal RV size with mildly decreased systolic function, moderate-severe central MR.  - Echo 04/13/17  LVEF  15-20%, Severe LV dilation, Mod LAE, Mild RAE - CPX (5/19): RER 0.95, peak VO2 9.8, VE/VCO2 26 => submaximal but likely severe functional impairement.  - Echo (7/19): EF 20-25%, moderate LV dilation, RV normal size with mildly decreased systolic function, moderate central MR.  2. Morbid obesity.  3. 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  Echo (10/17) with moderate-severe MR. Echo (7/19) with moderate central MR.  4. OSA: CPAP.   Review of systems complete and found to be negative unless listed in HPI.   SH:  Social History   Socioeconomic History  . Marital status: Single    Spouse name: Not on file  . Number of children: 2  . Years of education: Not on file  . Highest education level: Not on file  Occupational History  . Not on file  Social Needs  . Financial resource strain: Not very hard  . Food insecurity    Worry: Never true    Inability: Never true  . Transportation needs    Medical: Yes    Non-medical: Yes  Tobacco Use  . Smoking status: Former Smoker    Quit date: 10/19/2015    Years since quitting: 3.5  . Smokeless tobacco: Never Used  Substance and Sexual Activity  . Alcohol use: Yes    Alcohol/week: 0.0 standard drinks    Comment: occasionally  . Drug use: Yes    Types: Marijuana    Comment: only socially  . Sexual activity: Yes  Lifestyle  . Physical activity    Days per week: Not on file  Minutes per session: Not on file  . Stress: Not on file  Relationships  . Social Musician on phone: Not on file    Gets together: Not on file    Attends religious service: Not on file    Active member of club or organization: Not on file    Attends meetings of clubs or organizations: Not on file    Relationship status: Not on file  . Intimate partner violence    Fear of current or ex partner: Not on file    Emotionally abused: Not on file    Physically abused: Not on file    Forced sexual activity: Not on file  Other Topics Concern   . Not on file  Social History Narrative  . Not on file   FH:  Family History  Problem Relation Age of Onset  . Heart attack Mother   . Hyperlipidemia Father   . Diabetes Brother    Current Outpatient Medications  Medication Sig Dispense Refill  . albuterol (PROAIR HFA) 108 (90 Base) MCG/ACT inhaler Inhale 2 puffs into the lungs every 6 (six) hours as needed for wheezing or shortness of breath.    . carvedilol (COREG) 25 MG tablet Take 1 tablet (25 mg total) by mouth 2 (two) times daily with a meal. 60 tablet 5  . digoxin (LANOXIN) 0.125 MG tablet Take 1 tablet (0.125 mg total) by mouth daily. 30 tablet 5  . ipratropium-albuterol (DUONEB) 0.5-2.5 (3) MG/3ML SOLN Take 3 mLs by nebulization every 4 (four) hours as needed. 360 mL 0  . metFORMIN (GLUCOPHAGE) 500 MG tablet Take 500 mg by mouth daily with breakfast.    . potassium chloride SA (K-DUR) 20 MEQ tablet Take 20 mEq by mouth. 40 meq in the AM and 20 meq in the PM    . sacubitril-valsartan (ENTRESTO) 97-103 MG Take 1 tablet by mouth 2 (two) times daily. 60 tablet 11  . spironolactone (ALDACTONE) 25 MG tablet Take 1 tablet (25 mg total) by mouth daily. 90 tablet 3  . torsemide (DEMADEX) 20 MG tablet TAKE 4 TABLETS BY MOUTH EVERY MORNING AND 3 TABLETS EVERY EVENING 420 tablet 0   No current facility-administered medications for this encounter.    Vitals:   04/20/19 1207  BP: 114/86  Pulse: 100  SpO2: 95%  Weight: (!) 166.8 kg (367 lb 12.8 oz)   Wt Readings from Last 3 Encounters:  04/20/19 (!) 166.8 kg (367 lb 12.8 oz)  11/09/18 (!) 167.7 kg (369 lb 12.8 oz)  10/08/18 (!) 174.7 kg (385 lb 3.2 oz)    PHYSICAL EXAM: General:   No resp difficulty HEENT: normal Neck: supple. no JVD. Carotids 2+ bilat; no bruits. No lymphadenopathy or thryomegaly appreciated. Cor: PMI nondisplaced. Regular rate & rhythm. No rubs, gallops or murmurs. Lungs: clear Abdomen: obese, soft, nontender, nondistended. No hepatosplenomegaly. No bruits or  masses. Good bowel sounds. Extremities: no cyanosis, clubbing, rash, edema Neuro: alert & orientedx3, cranial nerves grossly intact. moves all 4 extremities w/o difficulty. Affect pleasant   ASSESSMENT & PLAN: 1.Chronic Systolic HF:  Nonischemic cardiomyopathy.  Echo 10/17 with severely dilated LV, EF 20%, moderate-severe central MR.  Echo (7/18) with EF 15-20%.  Echo (7/19) showed EF 20-25%, moderate LV dilation, mildly decreased RV systolic function, moderate functional MR. CPX (5/19) was submaximal but concerning for severe functional impairment due to HF.  No history of heavy ETOH or cocaine.  No family history of cardiomyopathy.  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   -NYHA II. Looks great. Volume status stable. Continue torsemide 80 qam/40 qpm.  - Continue Entresto 97/103 bid.   - Continue coreg 25 mg BID.  - Continue spironolactone 25 mg daily.  - Continue digoxin 0.125 mg daily - Check BMET  - Have previously discussed the need to prevent pregnancy with HF meds.  - Narrow QRS so not candidate for CRT upgrade.  - Based on CPX, she may be nearing the need for advanced therapies.   Obesity precludes transplant.   2. OSA: Continue CPAP   3.Mitral regurgitation: Moderate functional MR on 7/19 echo.  4. Morbid Obesity: Body mass index is 61.21 kg/m. Discussed portion control  5. DMII On metformin.  6. Smoker Discussed smoking cessation.   Check BMET. Follow up in 2 months.      Darrick Grinder, NP  04/20/2019

## 2019-04-20 NOTE — Patient Instructions (Signed)
Lab work done today. We will notify you of any abnormal lab work. No news is good news!  Please follow up with the [facility] Clinic in 2 months.  At the [facility], you and your health needs are our priority. As part of our continuing mission to provide you with exceptional heart care, we have created designated Provider Care Teams. These Care Teams include your primary Cardiologist (physician) and Advanced Practice Providers (APPs- Physician Assistants and Nurse Practitioners) who all work together to provide you with the care you need, when you need it.   You may see any of the following providers on your designated Care Team at your next follow up: . Dr Daniel Bensimhon . Dr Dalton McLean . [name] Clegg, NP   Please be sure to bring in all your medications bottles to every appointment.    

## 2019-04-21 ENCOUNTER — Telehealth (HOSPITAL_COMMUNITY): Payer: Self-pay | Admitting: Cardiology

## 2019-04-21 MED ORDER — POTASSIUM CHLORIDE CRYS ER 20 MEQ PO TBCR: 40.0000 meq | EXTENDED_RELEASE_TABLET | Freq: Two times a day (BID) | ORAL | 6 refills | Status: DC

## 2019-04-21 NOTE — Telephone Encounter (Signed)
-----   Message from Conrad Madaket, NP sent at 04/20/2019  2:44 PM EDT ----- Please ask her to take an extra 20 meq of potassium daily.

## 2019-04-21 NOTE — Telephone Encounter (Signed)
Notes recorded by Kerry Dory, CMA on 04/21/2019 at 3:32 PM EDT  Patient aware.  [phone] (H)  ------   Notes recorded by Kerry Dory, CMA on 04/20/2019 at 3:35 PM EDT  Left message for patient to call back.   ------   Notes recorded by Conrad Granbury, NP on 04/20/2019 at 2:44 PM EDT  Please ask her to take an extra 20 meq of potassium daily.

## 2019-04-26 ENCOUNTER — Ambulatory Visit (INDEPENDENT_AMBULATORY_CARE_PROVIDER_SITE_OTHER): Payer: Medicaid Other | Admitting: *Deleted

## 2019-04-26 DIAGNOSIS — I5022 Chronic systolic (congestive) heart failure: Secondary | ICD-10-CM

## 2019-04-26 DIAGNOSIS — I428 Other cardiomyopathies: Secondary | ICD-10-CM

## 2019-04-28 ENCOUNTER — Telehealth (HOSPITAL_COMMUNITY): Payer: Self-pay | Admitting: Licensed Clinical Social Worker

## 2019-04-28 LAB — CUP PACEART REMOTE DEVICE CHECK
Battery Remaining Longevity: 124 mo
Battery Voltage: 3.01 V
Brady Statistic RV Percent Paced: 0 %
Date Time Interrogation Session: 20200812075417
HighPow Impedance: 74 Ohm
Implantable Lead Implant Date: 20180802
Implantable Lead Location: 753860
Implantable Pulse Generator Implant Date: 20180802
Lead Channel Impedance Value: 342 Ohm
Lead Channel Impedance Value: 475 Ohm
Lead Channel Pacing Threshold Amplitude: 0.75 V
Lead Channel Pacing Threshold Pulse Width: 0.4 ms
Lead Channel Sensing Intrinsic Amplitude: 8.375 mV
Lead Channel Sensing Intrinsic Amplitude: 8.375 mV
Lead Channel Setting Pacing Amplitude: 2.5 V
Lead Channel Setting Pacing Pulse Width: 0.4 ms
Lead Channel Setting Sensing Sensitivity: 0.3 mV

## 2019-04-28 NOTE — Telephone Encounter (Signed)
CSW called to check in with patient.  Patient reports she is doing well and is continuing to exercise and try to eat healthier- trying to start eating some vegetarian meals as well and to cut out carbs.  Pt has no needs at this time- has her sister in town who is helping her  CSW will continue to follow and assist as needed.  Jorge Ny, LCSW Clinical Social Worker [facility] Desk#: [phone] Cell#: [phone]

## 2019-05-04 NOTE — Progress Notes (Signed)
Remote ICD transmission.   

## 2019-05-11 ENCOUNTER — Other Ambulatory Visit (HOSPITAL_COMMUNITY): Payer: Self-pay

## 2019-05-11 NOTE — Progress Notes (Signed)
Orders placed for pre procedure covid test. Pt aware of restrictions post test. Pt verbalized understanding and is agreeable to plan.

## 2019-05-12 ENCOUNTER — Telehealth (HOSPITAL_COMMUNITY): Payer: Self-pay | Admitting: *Deleted

## 2019-05-12 NOTE — Telephone Encounter (Signed)
HF Social Worker Eliezer Lofts) referral to call patient about CPX questions, concerns and preparation for Monday Aug 31st. Patient concerns were addressed and other questions were answered to patient's satisfaction. Patient was reminded to remain quarantined after COVID test on Friday until Appointment on Monday. Patient agreeable with plans.     Landis Martins, MS, ACSM-RCEP Clinical Exercise Physiologist

## 2019-05-14 ENCOUNTER — Other Ambulatory Visit: Payer: Self-pay

## 2019-05-14 ENCOUNTER — Inpatient Hospital Stay (HOSPITAL_COMMUNITY)
Admission: RE | Admit: 2019-05-14 | Discharge: 2019-05-14 | Disposition: A | Discharge status: Home / Self Care | Payer: Medicaid Other | Source: Ambulatory Visit

## 2019-05-14 DIAGNOSIS — Z20822 Contact with and (suspected) exposure to covid-19: Secondary | ICD-10-CM

## 2019-05-14 NOTE — Progress Notes (Signed)
No show for 950 am pre-procedure COVID testing, message left for patient to return call if unable to make appointment and a reminder that we close at 3 pm this afternoon

## 2019-05-16 LAB — NOVEL CORONAVIRUS, NAA: SARS-CoV-2, NAA: NOT DETECTED

## 2019-05-17 ENCOUNTER — Other Ambulatory Visit (HOSPITAL_COMMUNITY): Payer: Self-pay | Admitting: *Deleted

## 2019-05-17 ENCOUNTER — Other Ambulatory Visit: Payer: Self-pay

## 2019-05-17 ENCOUNTER — Ambulatory Visit (HOSPITAL_COMMUNITY): Payer: Medicaid Other | Attending: Cardiology

## 2019-05-17 ENCOUNTER — Other Ambulatory Visit (HOSPITAL_COMMUNITY): Payer: Self-pay | Admitting: Surgery

## 2019-05-17 ENCOUNTER — Other Ambulatory Visit (HOSPITAL_COMMUNITY): Payer: Self-pay | Admitting: Cardiology

## 2019-05-17 DIAGNOSIS — I5022 Chronic systolic (congestive) heart failure: Secondary | ICD-10-CM | POA: Insufficient documentation

## 2019-05-17 DIAGNOSIS — Z6841 Body Mass Index (BMI) 40.0 and over, adult: Secondary | ICD-10-CM | POA: Insufficient documentation

## 2019-05-20 ENCOUNTER — Other Ambulatory Visit (HOSPITAL_COMMUNITY): Payer: Self-pay | Admitting: Internal Medicine

## 2019-05-20 DIAGNOSIS — I5022 Chronic systolic (congestive) heart failure: Secondary | ICD-10-CM

## 2019-05-27 ENCOUNTER — Other Ambulatory Visit (HOSPITAL_COMMUNITY): Payer: Self-pay | Admitting: Cardiology

## 2019-06-02 ENCOUNTER — Telehealth (HOSPITAL_COMMUNITY): Payer: Self-pay

## 2019-06-02 DIAGNOSIS — I5022 Chronic systolic (congestive) heart failure: Secondary | ICD-10-CM

## 2019-06-02 NOTE — Telephone Encounter (Signed)
Referred to dr Leafy Ro for weight loss per dm

## 2019-06-14 ENCOUNTER — Telehealth (HOSPITAL_COMMUNITY): Payer: Self-pay | Admitting: Licensed Clinical Social Worker

## 2019-06-14 NOTE — Telephone Encounter (Signed)
CSW received message from pt complaining of not feeling well.  CSW spoke with pt over the phone regarding current concerns.  States that she is having increased chest congestion, some clear mucus, and increased SOB.  CSW assisted pt in calling PCP office to discuss with RN and set up appt- has appt for Wed at 4:15pm  CSW also messaged triage line with [facility] to see if any suggestions or concerns on our end  CSW will continue to follow and assist as needed  Jorge Ny, [facility] Desk#: [phone] Cell#: [phone]

## 2019-06-15 ENCOUNTER — Telehealth (HOSPITAL_COMMUNITY): Payer: Self-pay | Admitting: Surgery

## 2019-06-15 NOTE — Telephone Encounter (Signed)
Patient in [facility]&V center visiting.  She complains of SOB and congestion.  No temp noted.  O2 sats 95 percent on RA.  Patient has PCP appt tomorrow.  I advised that she go to [facility] for a COVID test based on current symptoms.

## 2019-06-17 ENCOUNTER — Encounter (HOSPITAL_COMMUNITY): Payer: Self-pay

## 2019-06-17 ENCOUNTER — Emergency Department (HOSPITAL_COMMUNITY): Payer: Medicaid Other

## 2019-06-17 ENCOUNTER — Other Ambulatory Visit: Payer: Self-pay

## 2019-06-17 ENCOUNTER — Telehealth (HOSPITAL_COMMUNITY): Payer: Self-pay | Admitting: *Deleted

## 2019-06-17 ENCOUNTER — Inpatient Hospital Stay (HOSPITAL_COMMUNITY)
Admission: EM | Admit: 2019-06-17 | Discharge: 2019-06-25 | DRG: 287 | Disposition: A | Discharge status: Home Health | Payer: Medicaid Other | Attending: Cardiology | Admitting: Cardiology

## 2019-06-17 DIAGNOSIS — Z7984 Long term (current) use of oral hypoglycemic drugs: Secondary | ICD-10-CM | POA: Diagnosis not present

## 2019-06-17 DIAGNOSIS — J4520 Mild intermittent asthma, uncomplicated: Secondary | ICD-10-CM | POA: Diagnosis not present

## 2019-06-17 DIAGNOSIS — I5023 Acute on chronic systolic (congestive) heart failure: Principal | ICD-10-CM | POA: Diagnosis present

## 2019-06-17 DIAGNOSIS — Z7951 Long term (current) use of inhaled steroids: Secondary | ICD-10-CM

## 2019-06-17 DIAGNOSIS — Z6841 Body Mass Index (BMI) 40.0 and over, adult: Secondary | ICD-10-CM | POA: Diagnosis not present

## 2019-06-17 DIAGNOSIS — I959 Hypotension, unspecified: Secondary | ICD-10-CM | POA: Diagnosis not present

## 2019-06-17 DIAGNOSIS — Z8249 Family history of ischemic heart disease and other diseases of the circulatory system: Secondary | ICD-10-CM | POA: Diagnosis not present

## 2019-06-17 DIAGNOSIS — Z720 Tobacco use: Secondary | ICD-10-CM | POA: Diagnosis not present

## 2019-06-17 DIAGNOSIS — N179 Acute kidney failure, unspecified: Secondary | ICD-10-CM | POA: Diagnosis present

## 2019-06-17 DIAGNOSIS — Z95810 Presence of automatic (implantable) cardiac defibrillator: Secondary | ICD-10-CM | POA: Diagnosis present

## 2019-06-17 DIAGNOSIS — I428 Other cardiomyopathies: Secondary | ICD-10-CM

## 2019-06-17 DIAGNOSIS — Z20828 Contact with and (suspected) exposure to other viral communicable diseases: Secondary | ICD-10-CM | POA: Diagnosis present

## 2019-06-17 DIAGNOSIS — I472 Ventricular tachycardia: Secondary | ICD-10-CM | POA: Diagnosis not present

## 2019-06-17 DIAGNOSIS — Z79899 Other long term (current) drug therapy: Secondary | ICD-10-CM | POA: Diagnosis not present

## 2019-06-17 DIAGNOSIS — E119 Type 2 diabetes mellitus without complications: Secondary | ICD-10-CM

## 2019-06-17 DIAGNOSIS — E1165 Type 2 diabetes mellitus with hyperglycemia: Secondary | ICD-10-CM | POA: Diagnosis present

## 2019-06-17 DIAGNOSIS — J45909 Unspecified asthma, uncomplicated: Secondary | ICD-10-CM | POA: Diagnosis present

## 2019-06-17 DIAGNOSIS — F17210 Nicotine dependence, cigarettes, uncomplicated: Secondary | ICD-10-CM | POA: Diagnosis present

## 2019-06-17 DIAGNOSIS — E876 Hypokalemia: Secondary | ICD-10-CM | POA: Diagnosis present

## 2019-06-17 DIAGNOSIS — R0600 Dyspnea, unspecified: Secondary | ICD-10-CM | POA: Diagnosis present

## 2019-06-17 DIAGNOSIS — O903 Peripartum cardiomyopathy: Secondary | ICD-10-CM | POA: Diagnosis not present

## 2019-06-17 DIAGNOSIS — I340 Nonrheumatic mitral (valve) insufficiency: Secondary | ICD-10-CM | POA: Diagnosis not present

## 2019-06-17 DIAGNOSIS — Z833 Family history of diabetes mellitus: Secondary | ICD-10-CM

## 2019-06-17 DIAGNOSIS — G4733 Obstructive sleep apnea (adult) (pediatric): Secondary | ICD-10-CM | POA: Diagnosis present

## 2019-06-17 DIAGNOSIS — T502X5A Adverse effect of carbonic-anhydrase inhibitors, benzothiadiazides and other diuretics, initial encounter: Secondary | ICD-10-CM | POA: Diagnosis present

## 2019-06-17 DIAGNOSIS — I5022 Chronic systolic (congestive) heart failure: Secondary | ICD-10-CM

## 2019-06-17 HISTORY — DX: Type 2 diabetes mellitus without complications: E11.9

## 2019-06-17 LAB — DIGOXIN LEVEL: Digoxin Level: <0.2 ng/mL — ABNORMAL LOW (ref 0.8–2.0)

## 2019-06-17 LAB — HEMOGLOBIN A1C
Hgb A1c MFr Bld: 9.4 % — ABNORMAL HIGH (ref 4.8–5.6)
Mean Plasma Glucose: 223.08 mg/dL

## 2019-06-17 LAB — CBC
HCT: 36 % (ref 36.0–46.0)
Hemoglobin: 12.6 g/dL (ref 12.0–15.0)
MCH: 28.1 pg (ref 26.0–34.0)
MCHC: 35 g/dL (ref 30.0–36.0)
MCV: 80.2 fL (ref 80.0–100.0)
Platelets: 247 10*3/uL (ref 150–400)
RBC: 4.49 MIL/uL (ref 3.87–5.11)
RDW: 13.3 % (ref 11.5–15.5)
WBC: 9.3 10*3/uL (ref 4.0–10.5)
nRBC: 0 % (ref 0.0–0.2)

## 2019-06-17 LAB — HIV ANTIBODY (ROUTINE TESTING W REFLEX): HIV Screen 4th Generation wRfx: NONREACTIVE

## 2019-06-17 LAB — BASIC METABOLIC PANEL
Anion gap: 11 (ref 5–15)
BUN: 15 mg/dL (ref 6–20)
CO2: 28 mmol/L (ref 22–32)
Calcium: 8.3 mg/dL — ABNORMAL LOW (ref 8.9–10.3)
Chloride: 97 mmol/L — ABNORMAL LOW (ref 98–111)
Creatinine, Ser: 1.04 mg/dL — ABNORMAL HIGH (ref 0.44–1.00)
GFR calc Af Amer: >60 mL/min (ref >60)
GFR calc non Af Amer: >60 mL/min (ref >60)
Glucose, Bld: 231 mg/dL — ABNORMAL HIGH (ref 70–99)
Potassium: 3.3 mmol/L — ABNORMAL LOW (ref 3.5–5.1)
Sodium: 136 mmol/L (ref 135–145)

## 2019-06-17 LAB — LIPID PANEL
Cholesterol: 89 mg/dL (ref 0–200)
HDL: 24 mg/dL — ABNORMAL LOW (ref >40)
LDL Cholesterol: 41 mg/dL (ref 0–99)
Total CHOL/HDL Ratio: 3.7 RATIO
Triglycerides: 121 mg/dL (ref <150)
VLDL: 24 mg/dL (ref 0–40)

## 2019-06-17 LAB — I-STAT BETA HCG BLOOD, ED (MC, WL, AP ONLY): I-stat hCG, quantitative: <5 m[IU]/mL (ref <5)

## 2019-06-17 LAB — MAGNESIUM
Magnesium: 1.4 mg/dL — ABNORMAL LOW (ref 1.7–2.4)
Magnesium: 1.4 mg/dL — ABNORMAL LOW (ref 1.7–2.4)

## 2019-06-17 LAB — BRAIN NATRIURETIC PEPTIDE: B Natriuretic Peptide: 414.8 pg/mL — ABNORMAL HIGH (ref 0.0–100.0)

## 2019-06-17 LAB — CBG MONITORING, ED
Glucose-Capillary: 139 mg/dL — ABNORMAL HIGH (ref 70–99)
Glucose-Capillary: 179 mg/dL — ABNORMAL HIGH (ref 70–99)

## 2019-06-17 LAB — TSH: TSH: 2.805 u[IU]/mL (ref 0.350–4.500)

## 2019-06-17 MED ORDER — ACETAMINOPHEN 325 MG PO TABS
650.0000 mg | ORAL_TABLET | ORAL | Status: DC | PRN
  Administered 2019-06-22 (×2): 650 mg via ORAL
  Filled 2019-06-17 (×2): qty 2

## 2019-06-17 MED ORDER — ENOXAPARIN SODIUM 40 MG/0.4ML ~~LOC~~ SOLN
40.0000 mg | SUBCUTANEOUS | Status: DC
  Administered 2019-06-18: 40 mg via SUBCUTANEOUS
  Filled 2019-06-17 (×3): qty 0.4

## 2019-06-17 MED ORDER — SACUBITRIL-VALSARTAN 97-103 MG PO TABS
1.0000 | ORAL_TABLET | Freq: Two times a day (BID) | ORAL | Status: DC
  Administered 2019-06-17 – 2019-06-21 (×9): 1 via ORAL
  Filled 2019-06-17 (×11): qty 1

## 2019-06-17 MED ORDER — IPRATROPIUM-ALBUTEROL 0.5-2.5 (3) MG/3ML IN SOLN
3.0000 mL | RESPIRATORY_TRACT | Status: DC | PRN
  Administered 2019-06-17 – 2019-06-21 (×5): 3 mL via RESPIRATORY_TRACT
  Filled 2019-06-17 (×4): qty 3

## 2019-06-17 MED ORDER — SODIUM CHLORIDE 0.9% FLUSH: 3.0000 mL | INTRAVENOUS | Status: DC | PRN

## 2019-06-17 MED ORDER — SODIUM CHLORIDE 0.9 % IV SOLN
250.0000 mL | INTRAVENOUS | Status: DC | PRN
  Administered 2019-06-20: 09:00:00 250 mL via INTRAVENOUS

## 2019-06-17 MED ORDER — ATORVASTATIN CALCIUM 10 MG PO TABS
10.0000 mg | ORAL_TABLET | Freq: Every day | ORAL | Status: DC
  Administered 2019-06-17 – 2019-06-25 (×9): 10 mg via ORAL
  Filled 2019-06-17 (×10): qty 1

## 2019-06-17 MED ORDER — DIGOXIN 125 MCG PO TABS
0.1250 mg | ORAL_TABLET | Freq: Every day | ORAL | Status: DC
  Administered 2019-06-17 – 2019-06-25 (×9): 0.125 mg via ORAL
  Filled 2019-06-17 (×9): qty 1

## 2019-06-17 MED ORDER — FUROSEMIDE 10 MG/ML IJ SOLN
80.0000 mg | Freq: Two times a day (BID) | INTRAMUSCULAR | Status: DC
  Administered 2019-06-17 – 2019-06-20 (×7): 80 mg via INTRAVENOUS
  Filled 2019-06-17 (×7): qty 8

## 2019-06-17 MED ORDER — SPIRONOLACTONE 25 MG PO TABS
25.0000 mg | ORAL_TABLET | Freq: Every day | ORAL | Status: DC
  Administered 2019-06-17 – 2019-06-25 (×9): 25 mg via ORAL
  Filled 2019-06-17 (×11): qty 1

## 2019-06-17 MED ORDER — CARVEDILOL 12.5 MG PO TABS
12.5000 mg | ORAL_TABLET | Freq: Two times a day (BID) | ORAL | Status: DC
  Administered 2019-06-18 (×2): 12.5 mg via ORAL
  Filled 2019-06-17 (×3): qty 1

## 2019-06-17 MED ORDER — INSULIN ASPART 100 UNIT/ML ~~LOC~~ SOLN
0.0000 [IU] | Freq: Three times a day (TID) | SUBCUTANEOUS | Status: DC
  Administered 2019-06-17: 2 [IU] via SUBCUTANEOUS
  Administered 2019-06-18: 16:00:00 5 [IU] via SUBCUTANEOUS
  Administered 2019-06-18: 08:00:00 2 [IU] via SUBCUTANEOUS
  Administered 2019-06-18 – 2019-06-20 (×5): 3 [IU] via SUBCUTANEOUS
  Administered 2019-06-20: 12:00:00 5 [IU] via SUBCUTANEOUS
  Administered 2019-06-20: 2 [IU] via SUBCUTANEOUS
  Administered 2019-06-21 (×2): 3 [IU] via SUBCUTANEOUS
  Administered 2019-06-22: 5 [IU] via SUBCUTANEOUS
  Administered 2019-06-22 (×2): 3 [IU] via SUBCUTANEOUS
  Administered 2019-06-23: 8 [IU] via SUBCUTANEOUS
  Administered 2019-06-23 – 2019-06-24 (×3): 3 [IU] via SUBCUTANEOUS
  Administered 2019-06-24: 12:00:00 2 [IU] via SUBCUTANEOUS
  Administered 2019-06-24 – 2019-06-25 (×2): 3 [IU] via SUBCUTANEOUS

## 2019-06-17 MED ORDER — POTASSIUM CHLORIDE CRYS ER 20 MEQ PO TBCR
40.0000 meq | EXTENDED_RELEASE_TABLET | Freq: Two times a day (BID) | ORAL | Status: DC
  Administered 2019-06-17 – 2019-06-25 (×16): 40 meq via ORAL
  Filled 2019-06-17 (×16): qty 2

## 2019-06-17 MED ORDER — ONDANSETRON HCL 4 MG/2ML IJ SOLN: 4.0000 mg | Freq: Four times a day (QID) | INTRAMUSCULAR | Status: DC | PRN

## 2019-06-17 MED ORDER — INSULIN ASPART 100 UNIT/ML ~~LOC~~ SOLN
0.0000 [IU] | Freq: Every day | SUBCUTANEOUS | Status: DC
  Administered 2019-06-18: 4 [IU] via SUBCUTANEOUS
  Administered 2019-06-19 – 2019-06-23 (×2): 2 [IU] via SUBCUTANEOUS

## 2019-06-17 NOTE — Telephone Encounter (Signed)
Late entry  Patient called this morning stating she was short of breath, low urine output, swelling in both legs. Per [name] Clegg,NP patient needs to be evaluated in the ED. Pt aware and agreeable with plan.

## 2019-06-17 NOTE — Consult Note (Addendum)
Cardiology Consultation:   [name] MRN: [id number]; DOB: 07/12/1981  Admit date: 06/17/2019 Date of Consult: 06/17/2019  Primary Care Provider: Eunice Blase'Buch, Greta, PA-C Primary Cardiologist: No primary care provider on file.  Primary Electrophysiologist:  Will Jorja LoaMartin Camnitz, MD  Advanced heart failure clinic: Marca Anconaalton McLean, MD   Patient Profile:   Kirsten Wheeler is a 38 y.o. 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 Rodena MedinMessick.  History of Present Illness:   Kirsten Wheeler had an episode of near syncope and volume overload in 2018.  Echo showed reduced EF.  Event was concerning for VT so an ICD was placed on 07/18/2017.  Postoperative course was complicated by pleuritic chest pain thought to be due to ICD microperforation of cardiac tissue.  She was taken for lead revision on 04/21/2017 with improvement.  Echocardiogram in 03/2017 showed EF 15-20%, severe LV dilatation, moderate LAE and mild RAE.  Cardiopulmonary exercise testing in 01/2018 was submaximal but appeared to show severe functional impairment from heart failure.  Echo in 03/2018 showed moderate LV dilatation with EF 20-25%, RV normal size with mildly decreased systolic function, moderate central MR.  The patient is followed in the advanced heart failure clinic by Dr. Shirlee LatchMcLean.  She was last seen on 04/20/2019 and appeared to be doing well, NYHA II symptoms.  Medical heart failure therapy includes carvedilol, digoxin, Entresto, Spironolactone and torsemide.  Weight on that day when patient was felt to be euvolemic and doing well was 166.8 kg.   The patient presents to the ED today with complaints of shortness of breath, increased lower extremity edema and abdominal fullness progressively worsening since about Monday.  Today she had shortness of breath just doing her hair. She says that  she is taking all of her heart failure medications as usual including torsemide 80 mg in the morning and 60 mg in the evening.  She says that her torsemide stopped working and she had decreased urine output.  She denies any precipitating event such as dietary indiscretion related to salt or fluids.  Her usual weight on a good day is 356 and she says on bad days can be up into the 370s.  Today her home weight was 376.  She has significant orthopnea and has been sleeping on her knees and leaning over the couch or bed.  She says that her abdomen feels heavy and causes pain due to pulling on her.  She denies any chest pain/pressure/tightness.  She feels like she needed to come in to have IV diuresis and reset everything.  She also has a chronic cough which she says has been worse.  On exam she has coarse inspiratory and expiratory wheezes.  She has a nebulizer but is out of the medication for it.  She does use an albuterol inhaler which she says helps her to bring up mucus.  She has been having productive cough with clear/white mucus, worse at night.  She uses CPAP but she says recently during the night she has felt like there was too much air blowing and she ends up taking it off at some point during the night.  She continues to smoke 5-6 cigarettes/day.  She has not smoked since Saturday due to her shortness of breath.  She says that she plans on not smoking anymore.  Chest x-ray shows cardiomegaly with mild interstitial thickening similar  to the prior study.  Suspect mild congestive heart failure.   - Echo (12/16) with EF 20-25%, moderate MR.  - Echo (5/17) with severely dilated LV, EF 10-15%, severe functional MR, mildly decreased RV systolic function. - LHC/RHC (5/17): No significant coronary disease; mean RA 15, PA 61/25 mean 41, mean PCWP 18, CI 1.7 (Fick), PVR 5.4 WU.  - Echo (10/17): EF 20%, severely dilated LV, normal RV size with mildly decreased systolic function, moderate-severe central MR.  -  Echo 04/13/17  LVEF 15-20%, Severe LV dilation, Mod LAE, Mild RAE - CPX (5/19): RER 0.95, peak VO2 9.8, VE/VCO2 26 => submaximal but likely severe functional impairement.  - Echo (7/19): EF 20-25%, moderate LV dilation, RV normal size with mildly decreased systolic function, moderate central MR.   Heart Pathway Score:     Past Medical History:  Diagnosis Date   Asthma    Chronic systolic CHF (congestive heart failure) ([place])    a. ECHO 01/15/2016 EF 10-15%. 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   Hearing loss    bilateral   Mitral regurgitation    a. severe, felt to be functional 2/2 LV dilation    Morbid obesity ([place])    Snoring     Past Surgical History:  Procedure Laterality Date   CARDIAC CATHETERIZATION N/A 01/18/2016   Procedure: Right/Left Heart Cath and Coronary Angiography;  Surgeon: Larey Dresser, MD;  Location: [facility];  Service: Cardiovascular;  Laterality: N/A;   CESAREAN SECTION     ICD IMPLANT N/A 04/17/2017   Procedure: ICD Implant;  Surgeon: Constance Haw, MD;  Location: [facility];  Service: Cardiovascular;  Laterality: N/A;   LEAD REVISION/REPAIR N/A 04/21/2017   Procedure: Lead Revision/Repair;  Surgeon: Constance Haw, MD;  Location: [facility];  Service: Cardiovascular;  Laterality: N/A;   TONSILLECTOMY       Home Medications:  Prior to Admission medications   Medication Sig Start Date End Date Taking? Authorizing Provider  albuterol (PROAIR HFA) 108 (90 Base) MCG/ACT inhaler Inhale 2 puffs into the lungs every 6 (six) hours as needed for wheezing or shortness of breath.    [provider]  carvedilol (COREG) 12.5 MG tablet TAKE 1 TABLET(12.5 MG) BY MOUTH TWICE DAILY WITH A MEAL 05/20/19   Larey Dresser, MD  carvedilol (COREG) 25 MG tablet Take 1 tablet (25 mg total) by mouth 2 (two) times daily with a meal. 04/30/18   Larey Dresser, MD  digoxin (LANOXIN) 0.125 MG tablet TAKE 1 TABLET(0.125 MG) BY  MOUTH DAILY 05/27/19   Larey Dresser, MD  ipratropium-albuterol (DUONEB) 0.5-2.5 (3) MG/3ML SOLN Take 3 mLs by nebulization every 4 (four) hours as needed. 06/08/18   Desiree Hane, MD  metFORMIN (GLUCOPHAGE) 500 MG tablet Take 500 mg by mouth daily with breakfast.    [provider]  potassium chloride SA (K-DUR) 20 MEQ tablet Take 2 tablets (40 mEq total) by mouth 2 (two) times daily. 04/21/19   Clegg, [name] D, NP  sacubitril-valsartan (ENTRESTO) 97-103 MG Take 1 tablet by mouth 2 (two) times daily. 03/03/19   Larey Dresser, MD  spironolactone (ALDACTONE) 25 MG tablet Take 1 tablet (25 mg total) by mouth daily. 03/31/19   Larey Dresser, MD  torsemide (DEMADEX) 20 MG tablet TAKE 4 TABLETS BY MOUTH EVERY MORNING AND 3 TABLETS EVERY EVENING 03/31/19   Larey Dresser, MD    Inpatient Medications: Scheduled Meds:  Continuous Infusions:  PRN Meds:   Allergies:   No Known Allergies  Social History:   Social History   Socioeconomic History   Marital status: Single    Spouse name: Not on file   Number of children: 2   Years of education: Not on file   Highest education level: Not on file  Occupational History   Not on file  Social Needs   Financial resource strain: Not very hard   Food insecurity    Worry: Never true    Inability: Never true   Transportation needs    Medical: Yes    Non-medical: Yes  Tobacco Use   Smoking status: Former Smoker    Quit date: 10/19/2015    Years since quitting: 3.6   Smokeless tobacco: Never Used  Substance and Sexual Activity   Alcohol use: Yes    Alcohol/week: 0.0 standard drinks    Comment: occasionally   Drug use: Yes    Types: Marijuana    Comment: only socially   Sexual activity: Yes  Lifestyle   Physical activity    Days per week: Not on file    Minutes per session: Not on file   Stress: Not on file  Relationships   Social connections    Talks on phone: Not on file    Gets together: Not on file     Attends religious service: Not on file    Active member of club or organization: Not on file    Attends meetings of clubs or organizations: Not on file    Relationship status: Not on file   Intimate partner violence    Fear of current or ex partner: Not on file    Emotionally abused: Not on file    Physically abused: Not on file    Forced sexual activity: Not on file  Other Topics Concern   Not on file  Social History Narrative   Not on file    Family History:    Family History  Problem Relation Age of Onset   Heart attack Mother    Hyperlipidemia Father    Diabetes Brother      ROS:  Please see the history of present illness.   All other ROS reviewed and negative.     Physical Exam/Data:   Vitals:   06/17/19 1507 06/17/19 1508 06/17/19 1515 06/17/19 1530  BP: (!) 91/53  101/77 112/78  Pulse: 97 98 98 96  Resp:  12 19 (!) 21  Temp:      TempSrc:      SpO2: 96% 94% 96% 97%  Weight:      Height:       No intake or output data in the 24 hours ending 06/17/19 1615 Last 3 Weights 06/17/2019 04/20/2019 11/09/2018  Weight (lbs) 375 lb 367 lb 12.8 oz 369 lb 12.8 oz  Weight (kg) 170.099 kg 166.833 kg 167.74 kg     Body mass index is 62.4 kg/m.  General: Morbidly obese female, in no acute distress HEENT: normal Lymph: no adenopathy Neck: no JVD in upright position, but difficult to assess given body habitus Endocrine:  No thryomegaly Vascular: No carotid bruits; FA pulses 2+ bilaterally without bruits  Cardiac:  normal S1, S2; RRR; no murmur  Lungs: Coarse inspiratory and expiratory wheezes Abd: soft, nontender, no hepatomegaly  Ext: 2+ lower leg bilateral edema Musculoskeletal:  No deformities, BUE and BLE strength normal and equal Skin: warm and dry  Neuro:  CNs 2-12 intact, no focal  abnormalities noted Psych:  Normal affect   EKG:  The EKG was personally reviewed and demonstrates: Sinus tachycardia, 111 bpm, possible LAE, abnormal R wave progression, inferior  ST/T abnormality.  QRS 120 ms.  QTc 456.  Not much change from previous EKG in January except for rate is faster.  Telemetry:  Telemetry was personally reviewed and demonstrates: SR/ST with rates upper 90s-low 100s  Relevant CV Studies:  Cardiopulmonary exercise test 05/17/2019 Exercise testing with gas exchange shows mild functional limitation due to patient's morbid obesity and related restrictive lung physiology. When corrected to the patient's ideal body weight, her pVO2 is excellent. There is no significant HF limitation observed aside from a flat BP response to exercise but this may be inaccurate due to difficulty with auscultation.   Arvilla Meres, MD   Most recent echo 03/30/2018 Study Conclusions - Left ventricle: The cavity size was moderately dilated. Wall   thickness was normal. Systolic function was severely reduced. The   estimated ejection fraction was in the range of 20% to 25%.   Diffuse hypokinesis. Features are consistent with a pseudonormal   left ventricular filling pattern, with concomitant abnormal   relaxation and increased filling pressure (grade 2 diastolic   dysfunction). - Aortic valve: There was no stenosis. - Mitral valve: There was moderate regurgitation. - Left atrium: The atrium was moderately dilated. - Right ventricle: The cavity size was normal. Pacer wire or   catheter noted in right ventricle. Systolic function was mildly   reduced. - Tricuspid valve: Peak RV-RA gradient (S): 28 mm Hg. - Pulmonary arteries: PA peak pressure: 31 mm Hg (S). - Inferior vena cava: The vessel was normal in size. The   respirophasic diameter changes were in the normal range (>= 50%),   consistent with normal central venous pressure.  Impressions: - Moderate dilated LV with EF 20-25%. Normal RV size with mildly   decreased systolic function. Moderate central MR, likely   functional.   Laboratory Data:  High Sensitivity Troponin:  No results for input(s):  TROPONINIHS in the last 720 hours.   Chemistry Recent Labs  Lab 06/17/19 1347  NA 136  K 3.3*  CL 97*  CO2 28  GLUCOSE 231*  BUN 15  CREATININE 1.04*  CALCIUM 8.3*  GFRNONAA >60  GFRAA >60  ANIONGAP 11    No results for input(s): PROT, ALBUMIN, AST, ALT, ALKPHOS, BILITOT in the last 168 hours. Hematology Recent Labs  Lab 06/17/19 1347  WBC 9.3  RBC 4.49  HGB 12.6  HCT 36.0  MCV 80.2  MCH 28.1  MCHC 35.0  RDW 13.3  PLT 247   BNPNo results for input(s): BNP, PROBNP in the last 168 hours.  DDimer No results for input(s): DDIMER in the last 168 hours.   Radiology/Studies:  Dg Chest 2 View  Result Date: 06/17/2019 CLINICAL DATA:  SOB, cough for 5 days - also having ankle swelling, abd distension - pt states recent congestion issues - hx of CHF, asthma, mitral regurgitation EXAM: CHEST - 2 VIEW COMPARISON:  06/08/2018.  Chest CT, 06/05/2018. FINDINGS: Moderate enlargement of the cardiopericardial silhouette, stable. No mediastinal or hilar masses. No evidence of adenopathy. There are prominent bronchovascular markings and mild interstitial thickening which is similar to the prior chest radiographs. No lung consolidation. No pleural effusion or pneumothorax. Stable left anterior chest wall single lead ICD. Skeletal structures are intact. IMPRESSION: 1. Cardiomegaly with mild interstitial thickening similar to the prior study. Suspect mild congestive heart failure. Electronically Signed  By: Amie Portland M.D.   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 Patient followed in the advanced heart failure clinic. -EF has been in the 15-25% range with moderate LV dilatation, mildly decreased RV systolic function and moderate functional MR.  CPX in 01/2018 was submaximal but concerning for severe functional impairment due to heart failure.  No prior  history of EtOH or cocaine. -Medical therapy includes Entresto 97/103 mg twice daily, carvedilol 25 mg twice daily, Spironolactone 25 mg daily, digoxin 0.125 mg daily, torsemide 80 mg in the morning/60 mg in the evening.  Patient reports compliance with her medications but her diuretic has not been working well since about Monday, decreased urine output. She tried an extra dose of torsemide without improvement.  -Presents with shortness of breath with minimal exertion, lower extremity edema, orthopnea, abdominal heaviness.  Weight is up from last office visit 166.8 kg to 170.1 kg today -Chest x-ray shows cardiomegaly with mild interstitial thickening similar to the prior study.  Suspect mild congestive heart failure. -Appears significant volume overloaded.  -Renal function is stable.  -Plan admission to the hospital for IV diuresis, [facility] will admit. Continue all HF medications except torsemide. Start Lasix 80 mg IV BID.  -Advanced Heart Failure team will follow.   Pulmonary -Pt with wheezing and productive cough.   ICD in situ -Followed by Dr. Elberta Fortis -Last interrogation 04/28/2019 showed some VT and episodes of SVT.  Hypokalemia -Potassium 3.3 -Patient is on K. Dur 40 mEq twice daily at home. -Try to keep potassium ~4.   OSA -On CPAP.  Recently patient has been taking it off due to feeling like too much air was blowing. -continue CPAP in hospital.   Morbid obesity -Body mass index is 62.4 kg/m.  -Weight loss advised  Mitral regurgitation -Moderate functional MR by echo 03/2018  Tobacco abuse -Continues to smoke 5-6 cigarettes/day.  Advised on cessation.  She says she had her last cigarette on Saturday and plans to not resume.  I strongly encouraged her to continue with that plan.  Diabetes type 2 -On metformin     For questions or updates, please contact [facility] Please consult [email or web address] for contact info under     Signed, Berton Bon, NP  06/17/2019 4:15 PM    Patient seen and examined with the above-signed Advanced Practice Provider and/or Housestaff. I personally reviewed laboratory data, imaging studies and relevant notes. I independently examined the patient and formulated the important aspects of the plan. I have edited the note to reflect any of my changes or salient points. I have personally discussed the plan with the patient and/or family.  38 y/o woman with super morbid obesity, DM2, OSA, chronic systolic HF due to severe NICM (EF ~20%) presents to the ER with several days of worsening HF with SOB at rest, marked edema and orthopnea/PND not responding to increased doses of oral diuretics. Admits to highlevels of water and gatorade intake.   On exam  General:  Morbidly obese woman sitting straight up on stretch. SOB with talking HEENT: normal Neck: supple. JVP hard to see but suspect to ear Carotids 2+ bilat; no bruits. No lymphadenopathy or thryomegaly appreciated. Cor: PMI nonpalpable Regular rate & rhythm. 2/6 MR Lungs: crackles at bases Abdomen: obese soft, nontender, nondistended. No hepatosplenomegaly. No bruits or masses. Good bowel sounds. Extremities: no cyanosis, clubbing, rash, 3+ edema  Neuro: alert & orientedx3, cranial nerves grossly intact. moves all 4 extremities w/o difficulty. Affect pleasant  Agree with admission for IV diuresis (appreciate [facility]'s assistance). Start lasix 80 IV bid. Add metolazone as needed. Can place UNNA boots if not responding rapidly. Stressed need for better compliance with dietary restriction. Supp K.   Arvilla Meres, MD  5:58 PM

## 2019-06-17 NOTE — ED Triage Notes (Signed)
Pt arrives POV for eval of SOB x 2-3 days. Pt reporst she feels her diuretics have recently stopped working, states she feels that she is retaining fluid. Reports some mild chest pain, but she feels It is d/t fluid retention. Pt is tachypneic in triage, satting well on RA

## 2019-06-17 NOTE — ED Provider Notes (Signed)
[facility] EMERGENCY DEPARTMENT Provider Note   CSN: [id number] Arrival date & time: 06/17/19  1334     History   Chief Complaint Chief Complaint  Patient presents with  . Shortness of Breath    HPI Kirsten Wheeler is a 38 y.o. female.     38 year old female with prior medical history as detailed below presents for evaluation of shortness of breath.  Patient reports gradually worsening shortness of breath over the last 3 to 4 days.  Patient denies fever or chest pain.  Patient reports prior history of CHF and is followed by the heart failure clinic here at [facility].  She reports full compliance with her regular medications.  She denies cough or congestion.  She is comfortable at rest.  Her symptoms are worse with exertion.  The history is provided by the patient and medical records.  Shortness of Breath Severity:  Moderate Onset quality:  Gradual Duration:  4 days Timing:  Constant Progression:  Waxing and waning Chronicity:  Recurrent Context: activity   Relieved by:  Nothing Worsened by:  Movement Ineffective treatments:  Diuretics   Past Medical History:  Diagnosis Date  . Asthma   . Chronic systolic CHF (congestive heart failure) ([place])    a. ECHO 01/15/2016 EF 10-15%. 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  Marland Kitchen Hearing loss    bilateral  . Mitral regurgitation    a. severe, felt to be functional 2/2 LV dilation   . Morbid obesity ([place])   . Snoring     Patient Active Problem List   Diagnosis Date Noted  . Amenorrhea 08/03/2018  . Congestive heart failure ([place]) 06/29/2018  . Hypotension 06/07/2018  . Rhinovirus infection 06/06/2018  . Asthma exacerbation 06/05/2018  . Acute respiratory failure with hypoxia ([place]) 06/05/2018  . Hypokalemia 06/05/2018  . Snoring 09/23/2016  . Cough 04/10/2016  . NICM (nonischemic cardiomyopathy) ([place]) 01/25/2016  . Tobacco abuse 01/25/2016  . Acute on chronic combined systolic and diastolic CHF  (congestive heart failure) ([place]) 01/25/2016  . Mitral regurgitation   . Abnormal EKG-inf TWI 01/15/2016  . Positive D dimer-CTA neg for PE 01/15/2016  . Morbid obesity- BMI 57 01/14/2016  . Abdominal pain 01/14/2016  . 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 At risk for sleep apnea 01/14/2016  . Cardiomyopathy ([place]) 11/01/2015  . Morbid obesity with BMI of 50.0-59.9, adult ([place]) 08/16/2015    Past Surgical History:  Procedure Laterality Date  . CARDIAC CATHETERIZATION N/A 01/18/2016   Procedure: Right/Left Heart Cath and Coronary Angiography;  Surgeon: Larey Dresser, MD;  Location: [facility];  Service: Cardiovascular;  Laterality: N/A;  . CESAREAN SECTION    . ICD IMPLANT N/A 04/17/2017   Procedure: ICD Implant;  Surgeon: Constance Haw, MD;  Location: [facility];  Service: Cardiovascular;  Laterality: N/A;  . LEAD REVISION/REPAIR N/A 04/21/2017   Procedure: Lead Revision/Repair;  Surgeon: Constance Haw, MD;  Location: [facility];  Service: Cardiovascular;  Laterality: N/A;  . TONSILLECTOMY       OB History   No obstetric history on file.      Home Medications    Prior to Admission medications   Medication Sig Start Date End Date Taking? Authorizing Provider  albuterol (PROAIR HFA) 108 (90 Base) MCG/ACT inhaler Inhale 2 puffs into the lungs every 6 (six) hours as needed for wheezing or shortness of breath.    [provider]  carvedilol (COREG) 12.5 MG tablet  TAKE 1 TABLET(12.5 MG) BY MOUTH TWICE DAILY WITH A MEAL 05/20/19   Laurey MoraleMcLean, Dalton S, MD  carvedilol (COREG) 25 MG tablet Take 1 tablet (25 mg total) by mouth 2 (two) times daily with a meal. 04/30/18   Laurey MoraleMcLean, Dalton S, MD  digoxin (LANOXIN) 0.125 MG tablet TAKE 1 TABLET(0.125 MG) BY MOUTH DAILY 05/27/19   Laurey MoraleMcLean, Dalton S, MD  ipratropium-albuterol (DUONEB) 0.5-2.5 (3) MG/3ML SOLN Take 3 mLs by nebulization every 4 (four) hours as needed. 06/08/18   Laverna PeaceNettey, Shayla D, MD  metFORMIN  (GLUCOPHAGE) 500 MG tablet Take 500 mg by mouth daily with breakfast.    [provider]  potassium chloride SA (K-DUR) 20 MEQ tablet Take 2 tablets (40 mEq total) by mouth 2 (two) times daily. 04/21/19   Clegg, [name] D, NP  sacubitril-valsartan (ENTRESTO) 97-103 MG Take 1 tablet by mouth 2 (two) times daily. 03/03/19   Laurey MoraleMcLean, Dalton S, MD  spironolactone (ALDACTONE) 25 MG tablet Take 1 tablet (25 mg total) by mouth daily. 03/31/19   Laurey MoraleMcLean, Dalton S, MD  torsemide (DEMADEX) 20 MG tablet TAKE 4 TABLETS BY MOUTH EVERY MORNING AND 3 TABLETS EVERY EVENING 03/31/19   Laurey MoraleMcLean, Dalton S, MD    Family History Family History  Problem Relation Age of Onset  . Heart attack Mother   . Hyperlipidemia Father   . Diabetes Brother     Social History Social History   Tobacco Use  . Smoking status: Former Smoker    Quit date: 10/19/2015    Years since quitting: 3.6  . Smokeless tobacco: Never Used  Substance Use Topics  . Alcohol use: Yes    Alcohol/week: 0.0 standard drinks    Comment: occasionally  . Drug use: Yes    Types: Marijuana    Comment: only socially     Allergies   Patient has no known allergies.   Review of Systems Review of Systems  Respiratory: Positive for shortness of breath.   All other systems reviewed and are negative.    Physical Exam Updated Vital Signs BP (!) 96/58   Pulse 97   Temp 98.8 F (37.1 C) (Oral)   Resp 18   Ht 5\' 5"  (1.651 m)   Wt (!) 170.1 kg   SpO2 96%   BMI 62.40 kg/m   Physical Exam Vitals signs and nursing note reviewed.  Constitutional:      General: She is not in acute distress.    Appearance: She is well-developed.  HENT:     Head: Normocephalic and atraumatic.  Eyes:     Conjunctiva/sclera: Conjunctivae normal.     Pupils: Pupils are equal, round, and reactive to light.  Neck:     Musculoskeletal: Normal range of motion and neck supple.  Cardiovascular:     Rate and Rhythm: Normal rate and regular rhythm.     Heart  sounds: Normal heart sounds.  Pulmonary:     Effort: Pulmonary effort is normal. No respiratory distress.     Breath sounds: Normal breath sounds.  Abdominal:     General: There is no distension.     Palpations: Abdomen is soft.     Tenderness: There is no abdominal tenderness.  Musculoskeletal: Normal range of motion.        General: No deformity.  Skin:    General: Skin is warm and dry.  Neurological:     General: No focal deficit present.     Mental Status: She is alert and oriented to person, place, and  time.      ED Treatments / Results  Labs (all labs ordered are listed, but only abnormal results are displayed) Labs Reviewed  BASIC METABOLIC PANEL - Abnormal; Notable for the following components:      Result Value   Potassium 3.3 (*)    Chloride 97 (*)    Glucose, Bld 231 (*)    Creatinine, Ser 1.04 (*)    Calcium 8.3 (*)    All other components within normal limits  SARS CORONAVIRUS 2 (TAT 6-24 HRS)  CBC  DIGOXIN LEVEL  I-STAT BETA HCG BLOOD, ED (MC, [facility], AP ONLY)    EKG EKG Interpretation  Date/Time:  Thursday June 17 2019 13:37:45 EDT Ventricular Rate:  111 PR Interval:  178 QRS Duration: 120 QT Interval:  336 QTC Calculation: 456 R Axis:   88 Text Interpretation:  Sinus tachycardia Possible Left atrial enlargement Abnormal ECG No significant change since last tracing Confirmed by Melene Plan [id number]) on 06/17/2019 2:48:52 PM   Radiology Dg Chest 2 View  Result Date: 06/17/2019 CLINICAL DATA:  SOB, cough for 5 days - also having ankle swelling, abd distension - pt states recent congestion issues - hx of CHF, asthma, mitral regurgitation EXAM: CHEST - 2 VIEW COMPARISON:  06/08/2018.  Chest CT, 06/05/2018. FINDINGS: Moderate enlargement of the cardiopericardial silhouette, stable. No mediastinal or hilar masses. No evidence of adenopathy. There are prominent bronchovascular markings and mild interstitial thickening which is similar to the prior chest  radiographs. No lung consolidation. No pleural effusion or pneumothorax. Stable left anterior chest wall single lead ICD. Skeletal structures are intact. IMPRESSION: 1. Cardiomegaly with mild interstitial thickening similar to the prior study. Suspect mild congestive heart failure. Electronically Signed   By: Amie Portland M.D.   On: 06/17/2019 14:15    Procedures Procedures (including critical care time)  Medications Ordered in ED Medications  furosemide (LASIX) injection 80 mg (has no administration in time range)  potassium chloride SA (KLOR-CON) CR tablet 40 mEq (has no administration in time range)     Initial Impression / Assessment and Plan / ED Course  I have reviewed the triage vital signs and the nursing notes.  Pertinent labs & imaging results that were available during my care of the patient were reviewed by me and considered in my medical decision making (see chart for details).        MDM  Screen complete  Kirsten Wheeler was evaluated in Emergency Department on 06/17/2019 for the symptoms described in the history of present illness. She was evaluated in the context of the global COVID-19 pandemic, which necessitated consideration that the patient might be at risk for infection with the SARS-CoV-2 virus that causes COVID-19. Institutional protocols and algorithms that pertain to the evaluation of patients at risk for COVID-19 are in a state of rapid change based on information released by regulatory bodies including the CDC and federal and state organizations. These policies and algorithms were followed during the patient's care in the ED.  Patient is presenting for evaluation of reported shortness of breath.  Her symptoms are consistent with likely CHF exacerbation.  She is well-known to cardiology -she has been evaluated today in the ED by the cardiology team.  They recommend admission for IV diuresis with admission to the hospitalist service.  They will follow on this  consultation.  Hospitalist service is aware of case and will evaluate for admission.   Final Clinical Impressions(s) / ED Diagnoses   Final diagnoses:  Dyspnea, unspecified  type    ED Discharge Orders    None       Wynetta Fines, MD 06/17/19 [phone]

## 2019-06-17 NOTE — H&P (Signed)
History and Physical    Kirsten Wheeler ZYY:[id number] DOB: [birth year] DOA: 06/17/2019  PCP: Janine Limbo, PA-C  Patient coming from: home I have personally briefly reviewed patient's old medical records in [place]  Chief Complaint: Shortness of breath and leg swelling since 4 days  HPI: Kirsten Wheeler is a 38 y.o. female with medical history significant of  postpartum cardiomyopathy status post ICD, obstructive sleep apnea on CPAP, tobacco abuse, morbid obesity with BMI of 62, asthma presents to emergency department due to shortness of breath, leg swelling since 4 days.  Reports association with orthopnea, PND, palpitation, cough, congestion however denies chest pain, fever, chills, runny nose, sore throat, recent COVID-19 exposure, headache, blurry vision, lightheadedness, dizziness, nausea, vomiting, abdominal pain, diarrhea, constipation, sleep or appetite changes.  She reports history of asthma-on albuterol breathing treatment at home.  She has obstructive sleep apnea and reports been compliant with CPAP.  Her last echo was done in 7/19 which showed ejection fraction of 20 to 25%, moderate LA dilated shunt, RV normal size with mildly decreased systolic function, moderate central MR, likely functional.  She lives at home with her-2 kids.  She is compliant with her home medication.  She reports smoking however she is trying to quit, drinks red wine occasionally and uses marijuana occasionally.  ED Course: Patient has tachycardia, hypokalemia, COVID-19: Pending.  Chest x-ray shows cardiomegaly with mild interstitial thickening similar to prior study.  Suspect mild congestive heart failure.  Cardiology evaluated the patient and recommended to start patient on Lasix 80 mg IV twice daily.  Review of Systems: As per HPI otherwise negative.    Past Medical History:  Diagnosis Date  . Asthma   . Chronic systolic CHF (congestive heart failure) ([place])    a. ECHO 01/15/2016 EF 10-15%. Severe  MR. 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  . Diabetes mellitus ([place]) 06/17/2019  . Hearing loss    bilateral  . Mitral regurgitation    a. severe, felt to be functional 2/2 LV dilation   . Morbid obesity ([place])   . Snoring     Past Surgical History:  Procedure Laterality Date  . CARDIAC CATHETERIZATION N/A 01/18/2016   Procedure: Right/Left Heart Cath and Coronary Angiography;  Surgeon: Larey Dresser, MD;  Location: [facility];  Service: Cardiovascular;  Laterality: N/A;  . CESAREAN SECTION    . ICD IMPLANT N/A 04/17/2017   Procedure: ICD Implant;  Surgeon: Constance Haw, MD;  Location: [facility];  Service: Cardiovascular;  Laterality: N/A;  . LEAD REVISION/REPAIR N/A 04/21/2017   Procedure: Lead Revision/Repair;  Surgeon: Constance Haw, MD;  Location: [facility];  Service: Cardiovascular;  Laterality: N/A;  . TONSILLECTOMY       reports that she quit smoking about 3 years ago. She has never used smokeless tobacco. She reports current alcohol use. She reports current drug use. Drug: Marijuana.  Allergies  Allergen Reactions  . Metformin And Related Diarrhea    And caused severe "dry mouth," also    Family History  Problem Relation Age of Onset  . Heart attack Mother   . Hyperlipidemia Father   . Diabetes Brother     Prior to Admission medications   Medication Sig Start Date End Date Taking? Authorizing Provider  albuterol (PROAIR HFA) 108 (90 Base) MCG/ACT inhaler Inhale 2 puffs into the lungs every 6 (six) hours as needed for wheezing or shortness of breath.   Yes [provider]  aspirin-acetaminophen-caffeine (  PAMPRIN MAX) 176-160-73 MG tablet Take 1 tablet by mouth every 6 (six) hours as needed (for pain).   Yes [provider]  atorvastatin (LIPITOR) 10 MG tablet Take 10 mg by mouth daily.  02/02/19  Yes [provider]  carvedilol (COREG) 12.5 MG tablet TAKE 1 TABLET(12.5 MG) BY MOUTH TWICE DAILY WITH A MEAL Patient  taking differently: Take 12.5 mg by mouth 2 (two) times daily with a meal.  05/20/19  Yes Larey Dresser, MD  digoxin (LANOXIN) 0.125 MG tablet TAKE 1 TABLET(0.125 MG) BY MOUTH DAILY Patient taking differently: Take 0.125 mg by mouth daily.  05/27/19  Yes Larey Dresser, MD  ipratropium-albuterol (DUONEB) 0.5-2.5 (3) MG/3ML SOLN Take 3 mLs by nebulization every 4 (four) hours as needed. Patient taking differently: Take 3 mLs by nebulization every 4 (four) hours as needed (for shortness of breath or wheezing).  06/08/18  Yes Oretha Milch D, MD  potassium chloride SA (K-DUR) 20 MEQ tablet Take 2 tablets (40 mEq total) by mouth 2 (two) times daily. 04/21/19  Yes Clegg, [name] D, NP  PRESCRIPTION MEDICATION CPAP- AT BEDTIME   Yes [provider]  sacubitril-valsartan (ENTRESTO) 97-103 MG Take 1 tablet by mouth 2 (two) times daily. 03/03/19  Yes Larey Dresser, MD  spironolactone (ALDACTONE) 25 MG tablet Take 1 tablet (25 mg total) by mouth daily. 03/31/19  Yes Larey Dresser, MD  torsemide (DEMADEX) 20 MG tablet TAKE 4 TABLETS BY MOUTH EVERY MORNING AND 3 TABLETS EVERY EVENING Patient taking differently: Take 60-80 mg by mouth See admin instructions. TAKE 4 TABLETS BY MOUTH EVERY MORNING AND 3 TABLETS EVERY EVENING 03/31/19  Yes Larey Dresser, MD  carvedilol (COREG) 25 MG tablet Take 1 tablet (25 mg total) by mouth 2 (two) times daily with a meal. 04/30/18   Larey Dresser, MD    Physical Exam: Vitals:   06/17/19 1530 06/17/19 1631 06/17/19 1645 06/17/19 1700  BP: 112/78 (!) 96/58 103/78 91/63  Pulse: 96 97 (!) 104 93  Resp: (!) 21 18 (!) 24 18  Temp:      TempSrc:      SpO2: 97% 96% 96% 96%  Weight:      Height:        Constitutional: NAD, calm, comfortable Vitals:   06/17/19 1530 06/17/19 1631 06/17/19 1645 06/17/19 1700  BP: 112/78 (!) 96/58 103/78 91/63  Pulse: 96 97 (!) 104 93  Resp: (!) 21 18 (!) 24 18  Temp:      TempSrc:      SpO2: 97% 96% 96% 96%  Weight:       Height:       Constitutional: Alert and oriented x3, in mild distress, morbidly obese, communicating well.   Eyes: PERRL, lids and conjunctivae normal ENMT: Mucous membranes are moist. Posterior pharynx clear of any exudate or lesions.Normal dentition.  Neck: normal, supple, no masses, no thyromegaly Respiratory: Diffuse expiratory wheezing positive.  Cardiovascular: Regular rate and rhythm, no murmurs / rubs / gallops. No extremity edema. 2+ pedal pulses. No carotid bruits.  Abdomen: no tenderness, no masses palpated. No hepatosplenomegaly. Bowel sounds positive.  Musculoskeletal: no clubbing / cyanosis. No joint deformity upper and lower extremities. Good ROM, no contractures. Normal muscle tone.  Skin: no rashes, lesions, ulcers. No induration Neurologic: CN 2-12 grossly intact. Sensation intact, DTR normal. Strength 5/5 in all 4.  Psychiatric: Normal judgment and insight. Alert and oriented x 3. Normal mood.    Labs on Admission:  I have personally reviewed following labs and imaging studies  CBC: Recent Labs  Lab 06/17/19 1347  WBC 9.3  HGB 12.6  HCT 36.0  MCV 80.2  PLT 147   Basic Metabolic Panel: Recent Labs  Lab 06/17/19 1347  NA 136  K 3.3*  CL 97*  CO2 28  GLUCOSE 231*  BUN 15  CREATININE 1.04*  CALCIUM 8.3*   GFR: Estimated Creatinine Clearance: 120.7 mL/min (A) (by C-G formula based on SCr of 1.04 mg/dL (H)). Liver Function Tests: No results for input(s): AST, ALT, ALKPHOS, BILITOT, PROT, ALBUMIN in the last 168 hours. No results for input(s): LIPASE, AMYLASE in the last 168 hours. No results for input(s): AMMONIA in the last 168 hours. Coagulation Profile: No results for input(s): INR, PROTIME in the last 168 hours. Cardiac Enzymes: No results for input(s): CKTOTAL, CKMB, CKMBINDEX, TROPONINI in the last 168 hours. BNP (last 3 results) No results for input(s): PROBNP in the last 8760 hours. HbA1C: No results for input(s): HGBA1C in the last 72  hours. CBG: No results for input(s): GLUCAP in the last 168 hours. Lipid Profile: No results for input(s): CHOL, HDL, LDLCALC, TRIG, CHOLHDL, LDLDIRECT in the last 72 hours. Thyroid Function Tests: No results for input(s): TSH, T4TOTAL, FREET4, T3FREE, THYROIDAB in the last 72 hours. Anemia Panel: No results for input(s): VITAMINB12, FOLATE, FERRITIN, TIBC, IRON, RETICCTPCT in the last 72 hours. Urine analysis:    Component Value Date/Time   COLORURINE YELLOW 01/14/2016 1250   APPEARANCEUR CLEAR 01/14/2016 1250   LABSPEC 1.012 01/14/2016 1250   PHURINE 6.0 01/14/2016 1250   GLUCOSEU NEGATIVE 01/14/2016 1250   HGBUR NEGATIVE 01/14/2016 1250   BILIRUBINUR NEGATIVE 01/14/2016 1250   KETONESUR NEGATIVE 01/14/2016 1250   PROTEINUR NEGATIVE 01/14/2016 1250   NITRITE NEGATIVE 01/14/2016 1250   LEUKOCYTESUR NEGATIVE 01/14/2016 1250    Radiological Exams on Admission: Dg Chest 2 View  Result Date: 06/17/2019 CLINICAL DATA:  SOB, cough for 5 days - also having ankle swelling, abd distension - pt states recent congestion issues - hx of CHF, asthma, mitral regurgitation EXAM: CHEST - 2 VIEW COMPARISON:  06/08/2018.  Chest CT, 06/05/2018. FINDINGS: Moderate enlargement of the cardiopericardial silhouette, stable. No mediastinal or hilar masses. No evidence of adenopathy. There are prominent bronchovascular markings and mild interstitial thickening which is similar to the prior chest radiographs. No lung consolidation. No pleural effusion or pneumothorax. Stable left anterior chest wall single lead ICD. Skeletal structures are intact. IMPRESSION: 1. Cardiomegaly with mild interstitial thickening similar to the prior study. Suspect mild congestive heart failure. Electronically Signed   By: Lajean Manes M.D.   On: 06/17/2019 14:15    EKG: Sinus tachycardia, no acute ST-T wave changes noted.  Assessment/Plan Principal Problem:   Acute on chronic systolic CHF (congestive heart failure)  (HCC) Active Problems:   Morbid obesity- BMI 57   Peripartum cardiomyopathy   NICM (nonischemic cardiomyopathy) (HCC)   Tobacco abuse   Asthma   Hypokalemia   Obstructive sleep apnea   ICD (implantable cardioverter-defibrillator) in place   Diabetes mellitus ([place])  Acute on chronic CHF: -Due to severe nonischemic cardiomyopathy.  Has ICD placed in 2018. -Patient presented with symptoms of fluid overload, chest x-ray reviewed which showed cardiomegaly-suspect mild CHF. -Admit patient in progressive unit for close monitoring. -Start on IV Lasix 80 mg IV twice daily -Strict INO's and daily weight.  Monitor electrolytes closely -Replace potassium and magnesium as needed. -Reviewed echo from 7/19 which showed ejection fraction of  20 to 25%. -Check proBNP.  On continuous pulse ox, on cardiac telemetry. -Continue statin, Coreg, Entresto, spironolactone and digoxin -EDP consulted cardiology-appreciate help  Asthma: -Patient has expiratory wheezing on exam. -On albuterol as needed nebulizer treatment -Continuous pulse ox -Chest x-ray is negative for infection.  Hypokalemia: Likely secondary to diuretics -Replenished and repeat BMP tomorrow a.m.  Obstructive sleep apnea: On CPAP at home -We will continue CPAP while she is in the hospital.  Morbid obesity: With BMI of 62. -Patient needs extensive counseling regarding dietary modification, exercise and weight loss.  Tobacco abuse: -Discussed about cessation and she verbalized understanding.  Type 2 diabetes mellitus: -We will check A1c and start patient on sliding scale insulin.  DVT prophylaxis: Lovenox, TED/SCD code Status: Full code Family Communication: None present at bedside.  Plan of care discussed with patient in length and he verbalized understanding and agreed with it. Disposition Plan: Likely home in 2-3 days Consults called: Cardiology  admission status: Inpatient  Mckinley Jewel MD [facility] Pager [phone]  If 7PM-7AM, please contact night-coverage [email or web address] Password [facility]  06/17/2019, 6:49 PM

## 2019-06-18 ENCOUNTER — Other Ambulatory Visit (HOSPITAL_COMMUNITY): Payer: Medicaid Other

## 2019-06-18 ENCOUNTER — Inpatient Hospital Stay (HOSPITAL_COMMUNITY): Payer: Medicaid Other

## 2019-06-18 DIAGNOSIS — I340 Nonrheumatic mitral (valve) insufficiency: Secondary | ICD-10-CM

## 2019-06-18 LAB — BASIC METABOLIC PANEL
Anion gap: 10 (ref 5–15)
BUN: 13 mg/dL (ref 6–20)
CO2: 28 mmol/L (ref 22–32)
Calcium: 8.6 mg/dL — ABNORMAL LOW (ref 8.9–10.3)
Chloride: 98 mmol/L (ref 98–111)
Creatinine, Ser: 0.93 mg/dL (ref 0.44–1.00)
GFR calc Af Amer: >60 mL/min (ref >60)
GFR calc non Af Amer: >60 mL/min (ref >60)
Glucose, Bld: 183 mg/dL — ABNORMAL HIGH (ref 70–99)
Potassium: 3.1 mmol/L — ABNORMAL LOW (ref 3.5–5.1)
Sodium: 136 mmol/L (ref 135–145)

## 2019-06-18 LAB — GLUCOSE, CAPILLARY
Glucose-Capillary: 220 mg/dL — ABNORMAL HIGH (ref 70–99)
Glucose-Capillary: 384 mg/dL — ABNORMAL HIGH (ref 70–99)

## 2019-06-18 LAB — MRSA PCR SCREENING: MRSA by PCR: NEGATIVE

## 2019-06-18 LAB — ECHOCARDIOGRAM COMPLETE
Height: 65 in
Weight: 6042.37 oz

## 2019-06-18 LAB — CBG MONITORING, ED
Glucose-Capillary: 142 mg/dL — ABNORMAL HIGH (ref 70–99)
Glucose-Capillary: 174 mg/dL — ABNORMAL HIGH (ref 70–99)

## 2019-06-18 LAB — SARS CORONAVIRUS 2 (TAT 6-24 HRS): SARS Coronavirus 2: NEGATIVE

## 2019-06-18 MED ORDER — GUAIFENESIN 100 MG/5ML PO SOLN
5.0000 mL | ORAL | Status: DC | PRN
  Administered 2019-06-18 – 2019-06-19 (×3): 100 mg via ORAL
  Filled 2019-06-18 (×3): qty 5

## 2019-06-18 MED ORDER — BENZONATATE 100 MG PO CAPS
100.0000 mg | ORAL_CAPSULE | Freq: Two times a day (BID) | ORAL | Status: DC | PRN
  Administered 2019-06-18 – 2019-06-20 (×3): 100 mg via ORAL
  Filled 2019-06-18 (×3): qty 1

## 2019-06-18 MED ORDER — SODIUM CHLORIDE 0.9% FLUSH
3.0000 mL | Freq: Two times a day (BID) | INTRAVENOUS | Status: DC
  Administered 2019-06-18 – 2019-06-22 (×7): 3 mL via INTRAVENOUS

## 2019-06-18 MED ORDER — ENOXAPARIN SODIUM 100 MG/ML ~~LOC~~ SOLN
0.5000 mg/kg | SUBCUTANEOUS | Status: DC
  Administered 2019-06-19 – 2019-06-24 (×6): 85 mg via SUBCUTANEOUS
  Filled 2019-06-18 (×7): qty 0.85

## 2019-06-18 MED ORDER — POTASSIUM CHLORIDE CRYS ER 20 MEQ PO TBCR
40.0000 meq | EXTENDED_RELEASE_TABLET | Freq: Once | ORAL | Status: AC
  Administered 2019-06-18: 40 meq via ORAL
  Filled 2019-06-18: qty 2

## 2019-06-18 MED ORDER — METOLAZONE 2.5 MG PO TABS
2.5000 mg | ORAL_TABLET | Freq: Once | ORAL | Status: AC
  Administered 2019-06-18: 2.5 mg via ORAL
  Filled 2019-06-18: qty 1

## 2019-06-18 NOTE — Evaluation (Signed)
Physical Therapy Evaluation Patient Details Name: Kirsten Wheeler MRN: [id number] DOB: 25-Dec-1980 Today's Date: 06/18/2019   History of Present Illness  Pt is a 38 y/o female admitted secondary to worsening SOB. Thought to be secondary to CHF exacerbation. PMH includes obesity, asthma, s/p ICD, DM, CHF, and OSA on CPAP.   Clinical Impression  Pt admitted secondary to problem above with deficits below. Pt easily fatigued and pt limited secondary to SOB. Required min guard for safety. Oxygen sats at 96-98% on RA. Feel pt will progress well once SOB controlled. Will continue to follow acutely to maximize functional mobility independence and safety.     Follow Up Recommendations No PT follow up    Equipment Recommendations  Other (comment)(TBD)    Recommendations for Other Services       Precautions / Restrictions Precautions Precautions: None Restrictions Weight Bearing Restrictions: No      Mobility  Bed Mobility               General bed mobility comments: Sitting EOB upon entry.   Transfers Overall transfer level: Needs assistance Equipment used: None Transfers: Sit to/from Stand Sit to Stand: Min guard         General transfer comment: Min guard for safety. No LOB noted.   Ambulation/Gait Ambulation/Gait assistance: Min guard Gait Distance (Feet): 20 Feet Assistive device: None Gait Pattern/deviations: Step-through pattern;Decreased stride length;Wide base of support Gait velocity: Decreased   General Gait Details: Guarded gait, and easily fatigued. Increased SOB noted and only able to tolerate short distance ambulation. Oxygen sats at 96-98% on RA.   Stairs            Wheelchair Mobility    Modified Rankin (Stroke Patients Only)       Balance Overall balance assessment: Needs assistance Sitting-balance support: No upper extremity supported;Feet supported Sitting balance-Leahy Scale: Good     Standing balance support: No upper extremity  supported;During functional activity Standing balance-Leahy Scale: Fair                               Pertinent Vitals/Pain Pain Assessment: No/denies pain    Home Living Family/patient expects to be discharged to:: Private residence Living Arrangements: Children Available Help at Discharge: Family Type of Home: Apartment Home Access: Stairs to enter Entrance Stairs-Rails: Right;Left;Can reach both Technical brewer of Steps: flight Home Layout: One level Home Equipment: None      Prior Function Level of Independence: Independent               Hand Dominance        Extremity/Trunk Assessment   Upper Extremity Assessment Upper Extremity Assessment: Defer to OT evaluation    Lower Extremity Assessment Lower Extremity Assessment: Overall WFL for tasks assessed    Cervical / Trunk Assessment Cervical / Trunk Assessment: Normal  Communication   Communication: No difficulties  Cognition Arousal/Alertness: Awake/alert Behavior During Therapy: WFL for tasks assessed/performed Overall Cognitive Status: Within Functional Limits for tasks assessed                                        General Comments General comments (skin integrity, edema, etc.): Educated about energy conservation techniques.     Exercises     Assessment/Plan    PT Assessment Patient needs continued PT services  PT Problem List Cardiopulmonary status limiting  activity;Decreased activity tolerance;Decreased mobility       PT Treatment Interventions Gait training;Stair training;Functional mobility training;Therapeutic activities;Therapeutic exercise;Patient/family education    PT Goals (Current goals can be found in the Care Plan section)  Acute Rehab PT Goals Patient Stated Goal: to be able to breathe better PT Goal Formulation: With patient Time For Goal Achievement: 07/02/19 Potential to Achieve Goals: Good    Frequency Min 3X/week   Barriers to  discharge        Co-evaluation               AM-PAC PT "6 Clicks" Mobility  Outcome Measure Help needed turning from your back to your side while in a flat bed without using bedrails?: None Help needed moving from lying on your back to sitting on the side of a flat bed without using bedrails?: A Little Help needed moving to and from a bed to a chair (including a wheelchair)?: A Little Help needed standing up from a chair using your arms (e.g., wheelchair or bedside chair)?: A Little Help needed to walk in hospital room?: A Little Help needed climbing 3-5 steps with a railing? : A Little 6 Click Score: 19    End of Session   Activity Tolerance: Patient limited by fatigue Patient left: in bed;with call bell/phone within reach(sitting EOB ) Nurse Communication: Mobility status PT Visit Diagnosis: Difficulty in walking, not elsewhere classified (R26.2)    Time: 1356-1410 PT Time Calculation (min) (ACUTE ONLY): 14 min   Charges:   PT Evaluation $PT Eval Moderate Complexity: 1 Mod          Gladys Damme, PT, DPT  Acute Rehabilitation Services  Pager: [phone] Office: [phone]   Lehman Prom 06/18/2019, 3:21 PM

## 2019-06-18 NOTE — Plan of Care (Signed)

## 2019-06-18 NOTE — ED Notes (Signed)
SDU  Ordered breakfast  

## 2019-06-18 NOTE — Progress Notes (Signed)
[facility]  PROGRESS NOTE    Kirsten Wheeler  BMW:[id number] DOB: [birth year] DOA: 06/17/2019 PCP: Janine Limbo, PA-C   Brief Narrative:   Kirsten Wheeler is a 38 y.o. female with medical history significant of  postpartum cardiomyopathy status post ICD, obstructive sleep apnea on CPAP, tobacco abuse, morbid obesity with BMI of 62, asthma presents to emergency department due to shortness of breath, leg swelling since 4 days.  Reports association with orthopnea, PND, palpitation, cough, congestion however denies chest pain, fever, chills, runny nose, sore throat, recent COVID-19 exposure, headache, blurry vision, lightheadedness, dizziness, nausea, vomiting, abdominal pain, diarrhea, constipation, sleep or appetite changes.  10/2: Started on lasix 80mg  IV BID; feels like she is improving   Assessment & Plan:   Principal Problem:   Acute on chronic systolic CHF (congestive heart failure) (HCC) Active Problems:   Morbid obesity- BMI 57   Peripartum cardiomyopathy   NICM (nonischemic cardiomyopathy) (HCC)   Tobacco abuse   Asthma   Hypokalemia   Obstructive sleep apnea   ICD (implantable cardioverter-defibrillator) in place   Diabetes mellitus ([place])   Acute on chronic CHF:     - Due to severe nonischemic cardiomyopathy.  Has ICD placed in 2018.     - Patient presented with symptoms of fluid overload, chest x-ray reviewed which showed cardiomegaly-suspect mild CHF.     - on IV Lasix 80 mg IV twice daily     - echo from 7/19 which showed ejection fraction of 20 to 25%.     - cards onboard; have agreed to take over service, appreciate their help; TR[facility] will stay on a consult for medical issues (asthma, DM2)  Asthma:     - CXR negative     - continue PRN albuterol     - sats good on RA  Hypokalemia:     - K+ is down today; replete     - 31mEq BID  Obstructive sleep apnea:      - On CPAP at home     - We will continue CPAP while she is in the hospital.  Morbid obesity:      - With BMI of  62.     - Patient needs extensive counseling regarding dietary modification, exercise and weight loss.  Tobacco abuse:     - cessation encouraged  Type 2 diabetes mellitus:     - A1c is 9.4     - SSI     - consult DM ed  Continue diuresis per cards. Appreciate them taking over service. We will continue to follow for med mgmt. DM ed for poorly controlled DM. Replete K+.   DVT prophylaxis: lovenox Code Status: FULL   Disposition Plan: TBD   ROS:  Reports improving dyspnea. Denies CP, palpitations, N, V. Remainder 10-pt ROS is negative for all not previously mentioned.  Subjective: "I knew that the torsemide wasn't working."  Objective: Vitals:   06/18/19 0821 06/18/19 0822 06/18/19 1030 06/18/19 1235  BP:    91/63  Pulse: 98 95 94 94  Resp: 19 19 (!) 22 16  Temp:      TempSrc:      SpO2: 95% 96% 94% 96%  Weight:      Height:        Intake/Output Summary (Last 24 hours) at 06/18/2019 1311 Last data filed at 06/18/2019 0900 Gross per 24 hour  Intake -  Output 600 ml  Net -600 ml   Filed Weights   06/17/19 1345  Weight: [facility])  170.1 kg    Examination:  General: 38 y.o. female resting in bed in NAD Eyes: PERRL, normal sclera ENMT: Nares patent w/o discharge, orophaynx clear, dentition normal, ears w/o discharge/lesions/ulcers Cardiovascular: RRR, +S1, S2, no m/g/r, equal pulses throughout Respiratory: decreased at bases, no w/r/r, normal WOB GI: BS+, obese, NDNT, no masses noted, no organomegaly noted MSK: No e/c/c Skin: No rashes, bruises, ulcerations noted Neuro: A&O x 3, no focal deficits Psyc: Appropriate interaction and affect, calm/cooperative   Data Reviewed: I have personally reviewed following labs and imaging studies.  CBC: Recent Labs  Lab 06/17/19 1347  WBC 9.3  HGB 12.6  HCT 36.0  MCV 80.2  PLT 247   Basic Metabolic Panel: Recent Labs  Lab 06/17/19 1347 06/17/19 1830 06/17/19 1957  NA 136  --   --   K 3.3*  --   --   CL 97*  --    --   CO2 28  --   --   GLUCOSE 231*  --   --   BUN 15  --   --   CREATININE 1.04*  --   --   CALCIUM 8.3*  --   --   MG  --  1.4* 1.4*   GFR: Estimated Creatinine Clearance: 120.7 mL/min (A) (by C-G formula based on SCr of 1.04 mg/dL (H)). Liver Function Tests: No results for input(s): AST, ALT, ALKPHOS, BILITOT, PROT, ALBUMIN in the last 168 hours. No results for input(s): LIPASE, AMYLASE in the last 168 hours. No results for input(s): AMMONIA in the last 168 hours. Coagulation Profile: No results for input(s): INR, PROTIME in the last 168 hours. Cardiac Enzymes: No results for input(s): CKTOTAL, CKMB, CKMBINDEX, TROPONINI in the last 168 hours. BNP (last 3 results) No results for input(s): PROBNP in the last 8760 hours. HbA1C: Recent Labs    06/17/19 1830  HGBA1C 9.4*   CBG: Recent Labs  Lab 06/17/19 2036 06/17/19 2252 06/18/19 0755 06/18/19 1212  GLUCAP 139* 179* 142* 174*   Lipid Profile: Recent Labs    06/17/19 1830  CHOL 89  HDL 24*  LDLCALC 41  TRIG 426  CHOLHDL 3.7   Thyroid Function Tests: Recent Labs    06/17/19 1957  TSH 2.805   Anemia Panel: No results for input(s): VITAMINB12, FOLATE, FERRITIN, TIBC, IRON, RETICCTPCT in the last 72 hours. Sepsis Labs: No results for input(s): PROCALCITON, LATICACIDVEN in the last 168 hours.  Recent Results (from the past 240 hour(s))  SARS CORONAVIRUS 2 (TAT 6-24 HRS) Nasopharyngeal Nasopharyngeal Swab     Status: None   Collection Time: 06/17/19  5:18 PM   Specimen: Nasopharyngeal Swab  Result Value Ref Range Status   SARS Coronavirus 2 NEGATIVE NEGATIVE Final    Comment: (NOTE) SARS-CoV-2 target nucleic acids are NOT DETECTED. The SARS-CoV-2 RNA is generally detectable in upper and lower respiratory specimens during the acute phase of infection. Negative results do not preclude SARS-CoV-2 infection, do not rule out co-infections with other pathogens, and should not be used as the sole basis for  treatment or other patient management decisions. Negative results must be combined with clinical observations, patient history, and epidemiological information. The expected result is Negative. Fact Sheet for Patients: HairSlick.no Fact Sheet for Healthcare Providers: quierodirigir.com This test is not yet approved or cleared by the [place] FDA and  has been authorized for detection and/or diagnosis of SARS-CoV-2 by FDA under an Emergency Use Authorization (EUA). This EUA will remain  in effect (meaning this  test can be used) for the duration of the COVID-19 declaration under Section 56 4(b)(1) of the Act, 21 U.S.C. section 360bbb-3(b)(1), unless the authorization is terminated or revoked sooner. Performed at [facility], [address], [place], [place] [address]       Radiology Studies: Dg Chest 2 View  Result Date: 06/17/2019 CLINICAL DATA:  SOB, cough for 5 days - also having ankle swelling, abd distension - pt states recent congestion issues - hx of CHF, asthma, mitral regurgitation EXAM: CHEST - 2 VIEW COMPARISON:  06/08/2018.  Chest CT, 06/05/2018. FINDINGS: Moderate enlargement of the cardiopericardial silhouette, stable. No mediastinal or hilar masses. No evidence of adenopathy. There are prominent bronchovascular markings and mild interstitial thickening which is similar to the prior chest radiographs. No lung consolidation. No pleural effusion or pneumothorax. Stable left anterior chest wall single lead ICD. Skeletal structures are intact. IMPRESSION: 1. Cardiomegaly with mild interstitial thickening similar to the prior study. Suspect mild congestive heart failure. Electronically Signed   By: Amie Portland M.D.   On: 06/17/2019 14:15     Scheduled Meds: . atorvastatin  10 mg Oral Daily  . carvedilol  12.5 mg Oral BID WC  . digoxin  0.125 mg Oral Daily  . enoxaparin (LOVENOX) injection  40 mg Subcutaneous  Q24H  . furosemide  80 mg Intravenous BID  . insulin aspart  0-15 Units Subcutaneous TID WC  . insulin aspart  0-5 Units Subcutaneous QHS  . metolazone  2.5 mg Oral Once  . potassium chloride  40 mEq Oral BID  . sacubitril-valsartan  1 tablet Oral BID  . sodium chloride flush  3 mL Intravenous Q12H  . spironolactone  25 mg Oral Daily   Continuous Infusions: . sodium chloride       LOS: 1 day    Time spent: 25 minutes spent in the coordination of care today.    Teddy Spike, DO [facility] Pager [phone]  If 7PM-7AM, please contact night-coverage [email or web address] Password [email or web address] 06/18/2019, 1:11 PM

## 2019-06-18 NOTE — Progress Notes (Signed)
  Echocardiogram 2D Echocardiogram has been performed.  Kirsten Wheeler 06/18/2019, 6:06 PM

## 2019-06-18 NOTE — ED Notes (Signed)
This RN has called Pharmacy to send Lovenox

## 2019-06-18 NOTE — Progress Notes (Addendum)
Advanced Heart Failure Rounding Note  PCP-Cardiologist: No primary care provider on file.   Subjective:    Remains in ED, still waiting on bed placement.   Feels a bit better today. Able to recline to sleep w/ less dyspnea/ orthopnea. Reports cough. COVID negative.   Urinated only 4 times last night after getting IV Lasix. Less responsive to IV Lasix compared to the past.   Objective:   Weight Range: (!) 170.1 kg Body mass index is 62.4 kg/m.   Vital Signs:   Temp:  [98.8 F (37.1 C)] 98.8 F (37.1 C) (10/01 1345) Pulse Rate:  [92-109] 95 (10/02 0445) Resp:  [10-24] 21 (10/02 0445) BP: (91-112)/(48-78) 106/64 (10/02 0445) SpO2:  [94 %-97 %] 97 % (10/02 0445) Weight:  [170.1 kg] 170.1 kg (10/01 1345)    Weight change: Filed Weights   06/17/19 1345  Weight: (!) 170.1 kg    Intake/Output:  No intake or output data in the 24 hours ending 06/18/19 0738    Physical Exam    General:  Super morbidly obese AAF No resp difficulty HEENT: Normal Neck: thick neck making JVD assessment difficult . Carotids 2+ bilat; no bruits. No lymphadenopathy or thyromegaly appreciated. Cor: PMI nondisplaced. Regular rate & rhythm. No rubs, gallops or murmurs. Lungs: inspiratory and expiratory wheezing, diffuse Abdomen: Obese abdomen, Soft, nontender, nondistended. No hepatosplenomegaly. No bruits or masses. Good bowel sounds. Extremities: No cyanosis, clubbing, rash, 2+ LEE Neuro: Alert & orientedx3, cranial nerves grossly intact. moves all 4 extremities w/o difficulty. Affect pleasant   Telemetry   NSR/ ST upper 90s-low 100s  EKG    Sinus tach 111 bpm, LAE, inferior ST/T wave abnormalties, no significant change from prior other than tachycardia  Labs    CBC Recent Labs    06/17/19 1347  WBC 9.3  HGB 12.6  HCT 36.0  MCV 80.2  PLT 247   Basic Metabolic Panel Recent Labs    55/97/41 1347 06/17/19 1830 06/17/19 1957  NA 136  --   --   K 3.3*  --   --   CL 97*   --   --   CO2 28  --   --   GLUCOSE 231*  --   --   BUN 15  --   --   CREATININE 1.04*  --   --   CALCIUM 8.3*  --   --   MG  --  1.4* 1.4*   Liver Function Tests No results for input(s): AST, ALT, ALKPHOS, BILITOT, PROT, ALBUMIN in the last 72 hours. No results for input(s): LIPASE, AMYLASE in the last 72 hours. Cardiac Enzymes No results for input(s): CKTOTAL, CKMB, CKMBINDEX, TROPONINI in the last 72 hours.  BNP: BNP (last 3 results) Recent Labs    08/25/18 1020 06/17/19 1957  BNP 126.8* 414.8*    ProBNP (last 3 results) No results for input(s): PROBNP in the last 8760 hours.   D-Dimer No results for input(s): DDIMER in the last 72 hours. Hemoglobin A1C Recent Labs    06/17/19 1830  HGBA1C 9.4*   Fasting Lipid Panel Recent Labs    06/17/19 1830  CHOL 89  HDL 24*  LDLCALC 41  TRIG 638  CHOLHDL 3.7   Thyroid Function Tests Recent Labs    06/17/19 1957  TSH 2.805    Other results:   Imaging    Dg Chest 2 View  Result Date: 06/17/2019 CLINICAL DATA:  SOB, cough for 5 days - also having  ankle swelling, abd distension - pt states recent congestion issues - hx of CHF, asthma, mitral regurgitation EXAM: CHEST - 2 VIEW COMPARISON:  06/08/2018.  Chest CT, 06/05/2018. FINDINGS: Moderate enlargement of the cardiopericardial silhouette, stable. No mediastinal or hilar masses. No evidence of adenopathy. There are prominent bronchovascular markings and mild interstitial thickening which is similar to the prior chest radiographs. No lung consolidation. No pleural effusion or pneumothorax. Stable left anterior chest wall single lead ICD. Skeletal structures are intact. IMPRESSION: 1. Cardiomegaly with mild interstitial thickening similar to the prior study. Suspect mild congestive heart failure. Electronically Signed   By: Amie Portland M.D.   On: 06/17/2019 14:15      Medications:     Scheduled Medications: . atorvastatin  10 mg Oral Daily  . carvedilol  12.5  mg Oral BID WC  . digoxin  0.125 mg Oral Daily  . enoxaparin (LOVENOX) injection  40 mg Subcutaneous Q24H  . furosemide  80 mg Intravenous BID  . insulin aspart  0-15 Units Subcutaneous TID WC  . insulin aspart  0-5 Units Subcutaneous QHS  . potassium chloride  40 mEq Oral BID  . sacubitril-valsartan  1 tablet Oral BID  . spironolactone  25 mg Oral Daily     Infusions: . sodium chloride       PRN Medications:  sodium chloride, acetaminophen, ipratropium-albuterol, ondansetron (ZOFRAN) IV, sodium chloride flush    Patient Profile   38 y/o woman with super morbid obesity, DM2, OSA, chronic systolic HF due to severe NICM (EF ~20%) presents to the ER with several days of worsening HF with SOB at rest, marked edema and orthopnea/PND not responding to increased doses of oral diuretics. Admits to highlevels of water and gatorade intake.   Assessment/Plan   1. Acute on Chronic Systolic HF: EF 20%. NICM. Acute exacerbation in the setting of dietary indiscretion w/ sodium. Pt believes she is ~20 lb above dry weight (dry weight ~360 lb, ~380 lb yesterday). BNP 414. - Continue IV Lasix 80 mg bid -Strict I/os not being recorded in ED but pt reports less response to IV Lasix compared to previous -BMP pending for today. If SCr and K stable, may need to add metolazone as an adjunct to increase diuresis.  -low sodium diet. Discussed avoidance of Gatorade. -Continue Entresto 97-103 -Continue spironolactone 25 -Continue Coreg 12.5 mg bid -Continue digoxin 0.125 mg -Monitor renal function and K>>daily BMPs -Strict I/Os and daily weights. Diurese back down to dry weight of 360 lb.   2. Hypokalemia: K 3.3 on admit.  Kdur 40 mEq bid ordered F/u BMP lab pending   Length of Stay: 1  Robbie Lis, PA-C  06/18/2019, 7:38 AM  Advanced Heart Failure Team Pager [phone] (M-F; 7a - 4p)  Please contact [facility] for night-coverage after hours (4p -7a ) and weekends on  [email or web address]  Patient seen with PA, agree with the above note.   She responded to IV Lasix better this morning.  Still orthopneic.   General: NAD, obese.  Neck: JVP 10-11 cm, no thyromegaly or thyroid nodule.  Lungs: Clear to auscultation bilaterally with normal respiratory effort. CV: Nonpalpable PMI.  Heart regular S1/S2, no S3/S4, no murmur. 1+ edema 1/2 to knees.   Abdomen: Soft, nontender, no hepatosplenomegaly, no distention.  Skin: Intact without lesions or rashes.  Neurologic: Alert and oriented x 3.  Psych: Normal affect. Extremities: No clubbing or cyanosis.  HEENT: Normal.   Acute on chronic systolic CHF, denies dietary indiscretion.  Says  torsemide "not working."  Weight up at home.  - Continue her current CHF med regimen, BP not high enough to titrate.  - With elevated HR, may be candidate for ivabradine.   - Continue Lasix 80 mg IV bid, will add a dose of metolazone 2.5 with pm Lasix.  - Given worsening diuretic resistance, will consider RHC on Monday.  Discussed potential procedure with patient and she agrees.  - Needs BMET now, replace K as needed.   Loralie Champagne 06/18/2019 12:07 PM

## 2019-06-18 NOTE — ED Notes (Signed)
Pt ambulated independently to the bathroom 

## 2019-06-19 ENCOUNTER — Inpatient Hospital Stay (HOSPITAL_COMMUNITY): Payer: Medicaid Other

## 2019-06-19 LAB — GLUCOSE, CAPILLARY
Glucose-Capillary: 156 mg/dL — ABNORMAL HIGH (ref 70–99)
Glucose-Capillary: 164 mg/dL — ABNORMAL HIGH (ref 70–99)
Glucose-Capillary: 180 mg/dL — ABNORMAL HIGH (ref 70–99)
Glucose-Capillary: 215 mg/dL — ABNORMAL HIGH (ref 70–99)

## 2019-06-19 LAB — BASIC METABOLIC PANEL
Anion gap: 9 (ref 5–15)
BUN: 14 mg/dL (ref 6–20)
CO2: 31 mmol/L (ref 22–32)
Calcium: 9 mg/dL (ref 8.9–10.3)
Chloride: 96 mmol/L — ABNORMAL LOW (ref 98–111)
Creatinine, Ser: 0.89 mg/dL (ref 0.44–1.00)
GFR calc Af Amer: >60 mL/min (ref >60)
GFR calc non Af Amer: >60 mL/min (ref >60)
Glucose, Bld: 118 mg/dL — ABNORMAL HIGH (ref 70–99)
Potassium: 3.5 mmol/L (ref 3.5–5.1)
Sodium: 136 mmol/L (ref 135–145)

## 2019-06-19 LAB — CBC WITH DIFFERENTIAL/PLATELET
Abs Immature Granulocytes: 0.04 10*3/uL (ref 0.00–0.07)
Basophils Absolute: 0 10*3/uL (ref 0.0–0.1)
Basophils Relative: 0 %
Eosinophils Absolute: 0.1 10*3/uL (ref 0.0–0.5)
Eosinophils Relative: 1 %
HCT: 36.4 % (ref 36.0–46.0)
Hemoglobin: 12.5 g/dL (ref 12.0–15.0)
Immature Granulocytes: 0 %
Lymphocytes Relative: 30 %
Lymphs Abs: 2.9 10*3/uL (ref 0.7–4.0)
MCH: 27.7 pg (ref 26.0–34.0)
MCHC: 34.3 g/dL (ref 30.0–36.0)
MCV: 80.7 fL (ref 80.0–100.0)
Monocytes Absolute: 0.5 10*3/uL (ref 0.1–1.0)
Monocytes Relative: 5 %
Neutro Abs: 6.3 10*3/uL (ref 1.7–7.7)
Neutrophils Relative %: 64 %
Platelets: 276 10*3/uL (ref 150–400)
RBC: 4.51 MIL/uL (ref 3.87–5.11)
RDW: 13.3 % (ref 11.5–15.5)
WBC: 9.9 10*3/uL (ref 4.0–10.5)
nRBC: 0 % (ref 0.0–0.2)

## 2019-06-19 LAB — MAGNESIUM: Magnesium: 1.5 mg/dL — ABNORMAL LOW (ref 1.7–2.4)

## 2019-06-19 MED ORDER — DM-GUAIFENESIN ER 30-600 MG PO TB12
1.0000 | ORAL_TABLET | Freq: Two times a day (BID) | ORAL | Status: DC
  Administered 2019-06-19 – 2019-06-25 (×13): 1 via ORAL
  Filled 2019-06-19 (×14): qty 1

## 2019-06-19 MED ORDER — METOLAZONE 2.5 MG PO TABS
2.5000 mg | ORAL_TABLET | Freq: Once | ORAL | Status: AC
  Administered 2019-06-19: 2.5 mg via ORAL
  Filled 2019-06-19: qty 1

## 2019-06-19 MED ORDER — MAGNESIUM SULFATE 2 GM/50ML IV SOLN
2.0000 g | Freq: Once | INTRAVENOUS | Status: AC
  Administered 2019-06-19: 2 g via INTRAVENOUS
  Filled 2019-06-19: qty 50

## 2019-06-19 MED ORDER — PREDNISONE 20 MG PO TABS
20.0000 mg | ORAL_TABLET | Freq: Every day | ORAL | Status: DC
  Administered 2019-06-19 – 2019-06-20 (×2): 20 mg via ORAL
  Filled 2019-06-19 (×2): qty 1

## 2019-06-19 MED ORDER — CARVEDILOL 6.25 MG PO TABS
6.2500 mg | ORAL_TABLET | Freq: Two times a day (BID) | ORAL | Status: DC
  Administered 2019-06-19 – 2019-06-21 (×4): 6.25 mg via ORAL
  Filled 2019-06-19 (×4): qty 1

## 2019-06-19 MED ORDER — INSULIN GLARGINE 100 UNIT/ML ~~LOC~~ SOLN
4.0000 [IU] | Freq: Every day | SUBCUTANEOUS | Status: DC
  Administered 2019-06-19 – 2019-06-22 (×4): 4 [IU] via SUBCUTANEOUS
  Filled 2019-06-19 (×4): qty 0.04

## 2019-06-19 MED ORDER — IVABRADINE HCL 5 MG PO TABS
5.0000 mg | ORAL_TABLET | Freq: Two times a day (BID) | ORAL | Status: DC
  Administered 2019-06-19 – 2019-06-25 (×11): 5 mg via ORAL
  Filled 2019-06-19 (×14): qty 1

## 2019-06-19 MED ORDER — IPRATROPIUM-ALBUTEROL 0.5-2.5 (3) MG/3ML IN SOLN
3.0000 mL | Freq: Four times a day (QID) | RESPIRATORY_TRACT | Status: DC
  Administered 2019-06-19 – 2019-06-24 (×15): 3 mL via RESPIRATORY_TRACT
  Filled 2019-06-19 (×17): qty 3

## 2019-06-19 NOTE — Progress Notes (Addendum)
[name], female   DOB: 07/01/1981, 38 y.o.   MRN: [id number]     Advanced Heart Failure Rounding Note  PCP-Cardiologist: No primary care provider on file.   Subjective:    Still coughing a lot, especially at night.  Good diuresis yesterday with addition of metolazone.  SBP soft 90s-100s.   CXR: Vascular congestion, no PNA.   Objective:   Weight Range: (!) 170.7 kg Body mass index is 62.62 kg/m.   Vital Signs:   Temp:  [97.6 F (36.4 C)-98.6 F (37 C)] 97.6 F (36.4 C) (10/03 1146) Pulse Rate:  [75-100] 89 (10/03 1146) Resp:  [13-20] 20 (10/03 0327) BP: (90-106)/(59-82) 90/77 (10/03 1146) SpO2:  [95 %-98 %] 97 % (10/03 1146) Weight:  [170.7 kg-171.3 kg] 170.7 kg (10/03 0238) Last BM Date: 06/18/19  Weight change: Filed Weights   06/17/19 1345 06/18/19 1302 06/19/19 0238  Weight: (!) 170.1 kg (!) 171.3 kg (!) 170.7 kg    Intake/Output:   Intake/Output Summary (Last 24 hours) at 06/19/2019 1152 Last data filed at 06/19/2019 0746 Gross per 24 hour  Intake 960 ml  Output 2450 ml  Net -1490 ml      Physical Exam    General: NAD, obese Neck:Thick, JVP difficult but appears elevated around 12, no thyromegaly or thyroid nodule.  Lungs: Clear to auscultation bilaterally with normal respiratory effort. CV: Nonpalpable PMI.  Heart regular S1/S2, no S3/S4, no murmur.  1+ edema to knees bilaterally.   Abdomen: Soft, nontender, no hepatosplenomegaly, no distention.  Skin: Intact without lesions or rashes.  Neurologic: Alert and oriented x 3.  Psych: Normal affect. Extremities: No clubbing or cyanosis.  HEENT: Normal.    Telemetry   NSR 90s-100s, personally reviewed.   Labs    CBC Recent Labs    06/17/19 1347 06/19/19 0219  WBC 9.3 9.9  NEUTROABS  --  6.3  HGB 12.6 12.5  HCT 36.0 36.4  MCV 80.2 80.7  PLT 247 093   Basic Metabolic Panel Recent Labs    06/17/19 1957 06/18/19 1230 06/19/19 0219  NA  --  136 136  K  --  3.1* 3.5  CL  --   98 96*  CO2  --  28 31  GLUCOSE  --  183* 118*  BUN  --  13 14  CREATININE  --  0.93 0.89  CALCIUM  --  8.6* 9.0  MG 1.4*  --  1.5*   Liver Function Tests No results for input(s): AST, ALT, ALKPHOS, BILITOT, PROT, ALBUMIN in the last 72 hours. No results for input(s): LIPASE, AMYLASE in the last 72 hours. Cardiac Enzymes No results for input(s): CKTOTAL, CKMB, CKMBINDEX, TROPONINI in the last 72 hours.  BNP: BNP (last 3 results) Recent Labs    08/25/18 1020 06/17/19 1957  BNP 126.8* 414.8*    ProBNP (last 3 results) No results for input(s): PROBNP in the last 8760 hours.   D-Dimer No results for input(s): DDIMER in the last 72 hours. Hemoglobin A1C Recent Labs    06/17/19 1830  HGBA1C 9.4*   Fasting Lipid Panel Recent Labs    06/17/19 1830  CHOL 89  HDL 24*  LDLCALC 41  TRIG 121  CHOLHDL 3.7   Thyroid Function Tests Recent Labs    06/17/19 1957  TSH 2.805    Other results:   Imaging    Dg Chest 2 View  Result Date: 06/19/2019 CLINICAL DATA:  Dyspnea and cough. EXAM: CHEST - 2 VIEW  COMPARISON:  06/17/2019 FINDINGS: Left-sided pacemaker unchanged. Lungs are adequately inflated and demonstrate persistent mild interstitial edema with mild cardiomegaly. No effusion. Remainder the exam is unchanged. IMPRESSION: Mild stable cardiomegaly with stable mild vascular congestion. Electronically Signed   By: Elberta Fortis M.D.   On: 06/19/2019 11:14     Medications:     Scheduled Medications: . atorvastatin  10 mg Oral Daily  . carvedilol  6.25 mg Oral BID WC  . dextromethorphan-guaiFENesin  1 tablet Oral BID  . digoxin  0.125 mg Oral Daily  . enoxaparin (LOVENOX) injection  0.5 mg/kg Subcutaneous Q24H  . furosemide  80 mg Intravenous BID  . insulin aspart  0-15 Units Subcutaneous TID WC  . insulin aspart  0-5 Units Subcutaneous QHS  . insulin glargine  4 Units Subcutaneous Daily  . ivabradine  5 mg Oral BID WC  . metolazone  2.5 mg Oral Once  .  potassium chloride  40 mEq Oral BID  . predniSONE  20 mg Oral Q breakfast  . sacubitril-valsartan  1 tablet Oral BID  . sodium chloride flush  3 mL Intravenous Q12H  . spironolactone  25 mg Oral Daily    Infusions: . sodium chloride    . 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 Admits to highlevels of water and gatorade intake.   Assessment/Plan   1. Acute on Chronic Systolic HF: Echo this admission with EF < 20%, relatively normal RV, moderate MR. NICM with [employer] ICD. Acute exacerbation in the setting of dietary indiscretion w/ sodium. Pt believes she is ~20 lb above dry weight (dry weight ~360 lb, ~380 lb yesterday). Good diuresis so far  - Continue IV Lasix 80 mg bid today and will give a dose of metolazone 2.5 again.  - With soft BP (no significant orthostatic symptoms), will decrease Coreg to 6.25 mg bid and add ivabradine 5 mg bid.  - Continue Entresto 97-103 - Continue spironolactone 25 - Continue digoxin 0.125 mg - Plan for RHC on Monday to assess filling pressures (exam is difficult) and cardiac output.  2. OSA: CPAP at night.  3. Morbid obesity: Continue to work on weight loss.  4. Asthma: Wheezy on exam with history of asthma.  No PNA on CXR.  - Started on prednisone by [facility].  - Will give Duonebs.    Length of Stay: 2  Marca Ancona, MD  06/19/2019, 11:52 AM  Advanced Heart Failure Team Pager [phone] (M-F; 7a - 4p)  Please contact [facility] for night-coverage after hours (4p -7a ) and weekends on [email or web address]

## 2019-06-19 NOTE — Consult Note (Signed)
Marland [facility].  CONSULT PROGRESS NOTE    Kirsten Wheeler  ZOX:[id number] DOB: 09/21/1980 DOA: 06/17/2019 PCP: Eunice Blase'Buch, Greta, PA-C   Requesting physician: Dr. Shirlee LatchMcLean  Reason for consultation: Medical Mgmt   Brief Narrative:   Kirsten Fountainis a 38 y.o.femalewith medical history significant of postpartum cardiomyopathy status post ICD, obstructive sleep apnea on CPAP, tobacco abuse, morbid obesity with BMI of 62, asthma presents to emergency department due to shortness of breath, leg swelling since 4 days. Reports association with orthopnea, PND, palpitation, cough, congestion however denies chest pain, fever, chills, runny nose, sore throat, recent COVID-19 exposure, headache, blurry vision, lightheadedness, dizziness, nausea, vomiting, abdominal pain, diarrhea, constipation, sleep or appetite changes.  10/2: Started on lasix 80mg  IV BID; feels like she is improving 10/3: C/o cough at night and racing heart; otherwise ok ON   Assessment & Plan:   Principal Problem:   Acute on chronic systolic CHF (congestive heart failure) (HCC) Active Problems:   Morbid obesity- BMI 57   Peripartum cardiomyopathy   NICM (nonischemic cardiomyopathy) (HCC)   Tobacco abuse   Asthma   Hypokalemia   Obstructive sleep apnea   ICD (implantable cardioverter-defibrillator) in place   Diabetes mellitus (HCC)   Acute on chronic CHF:     - Due to severe nonischemic cardiomyopathy.  Has ICD placed in 2018.     - Patient presented with symptoms of fluid overload, chest x-ray reviewed which showed cardiomegaly-suspect mild CHF.     - on IV Lasix 80 mg IV twice daily     - echo from 7/19 which showed ejection fraction of 20 to 25%.     - cards onboard; have agreed to take over service, appreciate their help; [facility] will stay on a consult for medical issues (asthma, DM2)     - per cards  Asthma:     - CXR negative     - continue PRN duonebs     - sats good on RA     - she is a little wheezy at the bases today; will  add short burst steroids and check repeat 2-view CXR  Hypokalemia Hypomagnesemia:     - K+ 3.5; continue current regimen     - Mg2+ is low; will give IV Mg2+     - monitor  Obstructive sleep apnea:      - On CPAP at home     - We will continue CPAP while she is in the hospital.     - didn't wear it last night because she didn't like the mask; encouraged to use regardless, can ask respiratory if there are other masks available  Morbid obesity:      - With BMI of 62.     - Patient needs extensive counseling regarding dietary modification, exercise and weight loss.  Tobacco abuse:     - cessation encouraged  Type 2 diabetes mellitus:     - A1c is 9.4     - SSI     - consult DM ed     - start lantus 4 units qday; titrate as necessary  DVT prophylaxis: lovenox Code Status: FULL Disposition Plan: Per primary  ROS:  Reports wheeze, orthopnea, palpitations. Denies chest pain. Remainder 10-pt ROS is negative for all not previously mentioned.  Subjective: "I don't know much about my diabetes."  Objective: Vitals:   06/18/19 2351 06/19/19 0238 06/19/19 0327 06/19/19 0742  BP:   95/70 100/75  Pulse: 91  90 90  Resp: 19  20  Temp:   98.3 F (36.8 C) 98.2 F (36.8 C)  TempSrc:   Oral Oral  SpO2:   96% 97%  Weight:  (!) 170.7 kg    Height:        Intake/Output Summary (Last 24 hours) at 06/19/2019 0856 Last data filed at 06/19/2019 0746 Gross per 24 hour  Intake 960 ml  Output 3050 ml  Net -2090 ml   Filed Weights   06/17/19 1345 06/18/19 1302 06/19/19 0238  Weight: (!) 170.1 kg (!) 171.3 kg (!) 170.7 kg    Examination:  General: 38 y.o. female resting in bed in NAD Eyes: PERRL, normal sclera ENMT: Nares patent w/o discharge, orophaynx clear, dentition normal, ears w/o discharge/lesions/ulcers Cardiovascular: RRR, +S1, S2, no m/g/r, equal pulses throughout Respiratory: insp wheeze noted b/l bases, normal WOB GI: BS+, obese, NDNT, no masses noted, no  organomegaly noted MSK: No c/c; BLE edema Skin: No rashes, bruises, ulcerations noted Neuro: A&O x 3, no focal deficits Psyc: Appropriate interaction and affect, calm/cooperative   Data Reviewed: I have personally reviewed following labs and imaging studies.  CBC: Recent Labs  Lab 06/17/19 1347 06/19/19 0219  WBC 9.3 9.9  NEUTROABS  --  6.3  HGB 12.6 12.5  HCT 36.0 36.4  MCV 80.2 80.7  PLT 247 276   Basic Metabolic Panel: Recent Labs  Lab 06/17/19 1347 06/17/19 1830 06/17/19 1957 06/18/19 1230 06/19/19 0219  NA 136  --   --  136 136  K 3.3*  --   --  3.1* 3.5  CL 97*  --   --  98 96*  CO2 28  --   --  28 31  GLUCOSE 231*  --   --  183* 118*  BUN 15  --   --  13 14  CREATININE 1.04*  --   --  0.93 0.89  CALCIUM 8.3*  --   --  8.6* 9.0  MG  --  1.4* 1.4*  --  1.5*   GFR: Estimated Creatinine Clearance: 141.4 mL/min (by C-G formula based on SCr of 0.89 mg/dL). Liver Function Tests: No results for input(s): AST, ALT, ALKPHOS, BILITOT, PROT, ALBUMIN in the last 168 hours. No results for input(s): LIPASE, AMYLASE in the last 168 hours. No results for input(s): AMMONIA in the last 168 hours. Coagulation Profile: No results for input(s): INR, PROTIME in the last 168 hours. Cardiac Enzymes: No results for input(s): CKTOTAL, CKMB, CKMBINDEX, TROPONINI in the last 168 hours. BNP (last 3 results) No results for input(s): PROBNP in the last 8760 hours. HbA1C: Recent Labs    06/17/19 1830  HGBA1C 9.4*   CBG: Recent Labs  Lab 06/18/19 0755 06/18/19 1212 06/18/19 1545 06/18/19 2103 06/19/19 0614  GLUCAP 142* 174* 220* 384* 156*   Lipid Profile: Recent Labs    06/17/19 1830  CHOL 89  HDL 24*  LDLCALC 41  TRIG 829  CHOLHDL 3.7   Thyroid Function Tests: Recent Labs    06/17/19 1957  TSH 2.805   Anemia Panel: No results for input(s): VITAMINB12, FOLATE, FERRITIN, TIBC, IRON, RETICCTPCT in the last 72 hours. Sepsis Labs: No results for input(s):  PROCALCITON, LATICACIDVEN in the last 168 hours.  Recent Results (from the past 240 hour(s))  SARS CORONAVIRUS 2 (TAT 6-24 HRS) Nasopharyngeal Nasopharyngeal Swab     Status: None   Collection Time: 06/17/19  5:18 PM   Specimen: Nasopharyngeal Swab  Result Value Ref Range Status   SARS Coronavirus 2 NEGATIVE NEGATIVE Final  Comment: (NOTE) SARS-CoV-2 target nucleic acids are NOT DETECTED. The SARS-CoV-2 RNA is generally detectable in upper and lower respiratory specimens during the acute phase of infection. Negative results do not preclude SARS-CoV-2 infection, do not rule out co-infections with other pathogens, and should not be used as the sole basis for treatment or other patient management decisions. Negative results must be combined with clinical observations, patient history, and epidemiological information. The expected result is Negative. Fact Sheet for Patients: SugarRoll.be Fact Sheet for Healthcare Providers: https://www.woods-mathews.com/ This test is not yet approved or cleared by the [place] FDA and  has been authorized for detection and/or diagnosis of SARS-CoV-2 by FDA under an Emergency Use Authorization (EUA). This EUA will remain  in effect (meaning this test can be used) for the duration of the COVID-19 declaration under Section 56 4(b)(1) of the Act, 21 U.S.C. section 360bbb-3(b)(1), unless the authorization is terminated or revoked sooner. Performed at [facility], [place] [address], [place], [place] [address]   MRSA PCR Screening     Status: None   Collection Time: 06/18/19 12:59 PM   Specimen: Nasopharyngeal  Result Value Ref Range Status   MRSA by PCR NEGATIVE NEGATIVE Final    Comment:        The GeneXpert MRSA Assay (FDA approved for NASAL specimens only), is one component of a comprehensive MRSA colonization surveillance program. It is not intended to diagnose MRSA infection nor to guide or  monitor treatment for MRSA infections. Performed at [facility], [place] [address], [place], [place] [address]       Radiology Studies: Dg Chest 2 View  Result Date: 06/17/2019 CLINICAL DATA:  SOB, cough for 5 days - also having ankle swelling, abd distension - pt states recent congestion issues - hx of CHF, asthma, mitral regurgitation EXAM: CHEST - 2 VIEW COMPARISON:  06/08/2018.  Chest CT, 06/05/2018. FINDINGS: Moderate enlargement of the cardiopericardial silhouette, stable. No mediastinal or hilar masses. No evidence of adenopathy. There are prominent bronchovascular markings and mild interstitial thickening which is similar to the prior chest radiographs. No lung consolidation. No pleural effusion or pneumothorax. Stable left anterior chest wall single lead ICD. Skeletal structures are intact. IMPRESSION: 1. Cardiomegaly with mild interstitial thickening similar to the prior study. Suspect mild congestive heart failure. Electronically Signed   By: Lajean Manes M.D.   On: 06/17/2019 14:15     Scheduled Meds: . atorvastatin  10 mg Oral Daily  . carvedilol  12.5 mg Oral BID WC  . dextromethorphan-guaiFENesin  1 tablet Oral BID  . digoxin  0.125 mg Oral Daily  . enoxaparin (LOVENOX) injection  0.5 mg/kg Subcutaneous Q24H  . furosemide  80 mg Intravenous BID  . insulin aspart  0-15 Units Subcutaneous TID WC  . insulin aspart  0-5 Units Subcutaneous QHS  . potassium chloride  40 mEq Oral BID  . predniSONE  20 mg Oral Q breakfast  . sacubitril-valsartan  1 tablet Oral BID  . sodium chloride flush  3 mL Intravenous Q12H  . spironolactone  25 mg Oral Daily   Continuous Infusions: . sodium chloride    . magnesium sulfate bolus IVPB       LOS: 2 days   Thank you for this consult. TRH will continue to follow with you.  Time spent: 35 minutes spent in the coordination of care including greater than half the time spent counseling patient on diabetes control and compliance with OSA  treatment.    Reathel Turi  Martin Majestic, DO [facility] Pager [phone]  If 7PM-7AM, please contact night-coverage [email or web address] Password [email or web address] 06/19/2019, 8:56 AM

## 2019-06-19 NOTE — Evaluation (Signed)
Occupational Therapy Evaluation and Discharge Patient Details Name: Kirsten Wheeler MRN: [id number] DOB: 02-20-1981 Today's Date: 06/19/2019    History of Present Illness Pt is a 38 y/o female admitted secondary to worsening SOB. Thought to be secondary to CHF exacerbation. PMH includes obesity, asthma, s/p ICD, DM, CHF, and OSA on CPAP.    Clinical Impression   This 38 yo female admitted with above presents to acute OT at an independent to Mod I level for basic ADLs with min guard A if she is walking outside of the room due to reporting legs feel weak. No further OT needs, we will sign off.     Follow Up Recommendations  No OT follow up;Supervision - Intermittent    Equipment Recommendations  Tub/shower seat       Precautions / Restrictions Precautions Precautions: None Restrictions Weight Bearing Restrictions: No      Mobility Bed Mobility Overal bed mobility: Independent                Transfers Overall transfer level: Needs assistance Equipment used: None Transfers: Sit to/from Stand Sit to Stand: Supervision         General transfer comment: ambulation with min gaurd A since pr reports her legs do feel weak the more she walks    Balance Overall balance assessment: No apparent balance deficits (not formally assessed)                                         ADL either performed or assessed with clinical judgement   ADL Overall ADL's : Independent;Modified independent                                        We did discuss sitting takes less energy than standing and that hot steamy showers can affecting her breathing (better to take cooler showers, have door to bathroom open, vent fan running, and have both ends of shower curtain slightly open to allow air to circulate.)     Vision Patient Visual Report: No change from baseline              Pertinent Vitals/Pain Pain Assessment: No/denies pain     Hand Dominance  Right   Extremity/Trunk Assessment Upper Extremity Assessment Upper Extremity Assessment: Overall WFL for tasks assessed           Communication Communication Communication: No difficulties   Cognition Arousal/Alertness: Awake/alert Behavior During Therapy: WFL for tasks assessed/performed Overall Cognitive Status: Within Functional Limits for tasks assessed                                                Home Living Family/patient expects to be discharged to:: Private residence Living Arrangements: Children;Spouse/significant other Available Help at Discharge: Family Type of Home: Apartment Home Access: Stairs to enter Secretary/administrator of Steps: flight Entrance Stairs-Rails: Right;Left;Can reach both Home Layout: One level     Bathroom Shower/Tub: Producer, television/film/video: Standard     Home Equipment: None          Prior Functioning/Environment Level of Independence: Independent  OT Goals(Current goals can be found in the care plan section) Acute Rehab OT Goals Patient Stated Goal: to go home  OT Frequency:                AM-PAC OT "6 Clicks" Daily Activity     Outcome Measure Help from another person eating meals?: None Help from another person taking care of personal grooming?: None Help from another person toileting, which includes using toliet, bedpan, or urinal?: None Help from another person bathing (including washing, rinsing, drying)?: None Help from another person to put on and taking off regular upper body clothing?: None Help from another person to put on and taking off regular lower body clothing?: None 6 Click Score: 24   End of Session Nurse Communication: Mobility status(pt needs to walk with RN staff)  Activity Tolerance: Patient tolerated treatment well Patient left: (sitting EOB)  OT Visit Diagnosis: Muscle weakness (generalized) (M62.81)                Time:  4259-5638 OT Time Calculation (min): 26 min Charges:  OT General Charges $OT Visit: 1 Visit OT Evaluation $OT Eval Moderate Complexity: 1 Mod OT Treatments $Self Care/Home Management : 8-22 mins  [place], OTR/L Acute [employer] Pager [phone] Office [phone]     Almon Register 06/19/2019, 6:22 PM

## 2019-06-19 NOTE — Progress Notes (Signed)
Patient was tried on CPAP and patient complained that "she doesn't like the nasal mask and the pressure is too much". Positive reinforcement was given to patient on the benefits of wearing CPAP. Patient still states that she doesn't want to use it. RT tried to adjust the pressure to a more comfortable setting although patient stated that she wears 10cmh2o at home (regimen). Patient states that it is still to much even with auto titrate and ramp. Patient refuse to wear CPAP for the night. CPAP machine is at bedside. Informed patient that if she changes her mind to let staff know to inform RT. Patient is currently stable at this time sitting on the side of the bed.   Rheda Kassab L. Jennette Kettle, RRT, RCP

## 2019-06-19 NOTE — Progress Notes (Signed)
Patient refused CPAP tonight, states she does not like the mask.

## 2019-06-20 LAB — GLUCOSE, CAPILLARY
Glucose-Capillary: 129 mg/dL — ABNORMAL HIGH (ref 70–99)
Glucose-Capillary: 236 mg/dL — ABNORMAL HIGH (ref 70–99)
Glucose-Capillary: 169 mg/dL — ABNORMAL HIGH (ref 70–99)
Glucose-Capillary: 144 mg/dL — ABNORMAL HIGH (ref 70–99)

## 2019-06-20 LAB — BASIC METABOLIC PANEL WITH GFR
Anion gap: 12 (ref 5–15)
BUN: 16 mg/dL (ref 6–20)
CO2: 28 mmol/L (ref 22–32)
Calcium: 9.5 mg/dL (ref 8.9–10.3)
Chloride: 94 mmol/L — ABNORMAL LOW (ref 98–111)
Creatinine, Ser: 0.85 mg/dL (ref 0.44–1.00)
GFR calc Af Amer: >60 mL/min (ref >60)
GFR calc non Af Amer: >60 mL/min (ref >60)
Glucose, Bld: 172 mg/dL — ABNORMAL HIGH (ref 70–99)
Potassium: 3.9 mmol/L (ref 3.5–5.1)
Sodium: 134 mmol/L — ABNORMAL LOW (ref 135–145)

## 2019-06-20 LAB — MAGNESIUM: Magnesium: 1.6 mg/dL — ABNORMAL LOW (ref 1.7–2.4)

## 2019-06-20 MED ORDER — ASPIRIN 81 MG PO CHEW
81.0000 mg | CHEWABLE_TABLET | ORAL | Status: AC
  Administered 2019-06-21: 81 mg via ORAL
  Filled 2019-06-20: qty 1

## 2019-06-20 MED ORDER — METOLAZONE 5 MG PO TABS
5.0000 mg | ORAL_TABLET | Freq: Once | ORAL | Status: AC
  Administered 2019-06-20: 5 mg via ORAL
  Filled 2019-06-20: qty 1

## 2019-06-20 MED ORDER — SODIUM CHLORIDE 0.9 % IV SOLN: INTRAVENOUS | Status: DC

## 2019-06-20 MED ORDER — MAGNESIUM SULFATE 50 % IJ SOLN
3.0000 g | Freq: Once | INTRAVENOUS | Status: AC
  Administered 2019-06-20: 3 g via INTRAVENOUS
  Filled 2019-06-20: qty 6

## 2019-06-20 MED ORDER — SODIUM CHLORIDE 0.9 % IV SOLN: 250.0000 mL | INTRAVENOUS | Status: DC | PRN

## 2019-06-20 MED ORDER — SODIUM CHLORIDE 0.9% FLUSH: 3.0000 mL | INTRAVENOUS | Status: DC | PRN

## 2019-06-20 MED ORDER — MAGNESIUM OXIDE 400 (241.3 MG) MG PO TABS
400.0000 mg | ORAL_TABLET | Freq: Two times a day (BID) | ORAL | Status: DC
  Administered 2019-06-20 – 2019-06-25 (×11): 400 mg via ORAL
  Filled 2019-06-20 (×11): qty 1

## 2019-06-20 MED ORDER — SODIUM CHLORIDE 0.9% FLUSH
3.0000 mL | Freq: Two times a day (BID) | INTRAVENOUS | Status: DC
  Administered 2019-06-21 – 2019-06-23 (×2): 3 mL via INTRAVENOUS

## 2019-06-20 NOTE — Progress Notes (Signed)
TOC CM referral for ivabridine, check Medicaid preferred drug list 2020 and medication is not listed. Contacted pt's [facility] and spoke to pharmacist. Cost is showing $500 and requires a prior [place], [phone]. [place], [place] [facility] TOC CM [phone]

## 2019-06-20 NOTE — Progress Notes (Signed)
[name], female   DOB: 07-21-1981, 38 y.o.   MRN: [id number]     Advanced Heart Failure Rounding Note  PCP-Cardiologist: No primary care provider on file.   Subjective:    Breathing is getting better.  HR lower on ivabradine.  SBP stable in 100s.  Still some wheezing.  Weight down, not sure all I/Os are getting recorded.   CXR: Vascular congestion, no PNA.   Objective:   Weight Range: (!) 168.5 kg Body mass index is 61.82 kg/m.   Vital Signs:   Temp:  [97.6 F (36.4 C)-98.2 F (36.8 C)] 98.1 F (36.7 C) (10/04 0321) Pulse Rate:  [75-91] 79 (10/04 0321) Resp:  [22-25] 22 (10/04 0400) BP: (90-118)/(60-81) 105/81 (10/04 0321) SpO2:  [95 %-98 %] 98 % (10/04 0321) Weight:  [168.5 kg] 168.5 kg (10/04 0321) Last BM Date: 06/19/19  Weight change: Filed Weights   06/18/19 1302 06/19/19 0238 06/20/19 0321  Weight: (!) 171.3 kg (!) 170.7 kg (!) 168.5 kg    Intake/Output:   Intake/Output Summary (Last 24 hours) at 06/20/2019 0727 Last data filed at 06/20/2019 0400 Gross per 24 hour  Intake 960 ml  Output 1500 ml  Net -540 ml      Physical Exam    General: NAD, obese.  Neck: JVP 10-12 range, no thyromegaly or thyroid nodule.  Lungs: End expiratory wheezes CV: Nonpalpable PMI.  Heart regular S1/S2, no S3/S4, no murmur.  1+ edema 1/2 to knees bilaterally.   Abdomen: Soft, nontender, no hepatosplenomegaly, no distention.  Skin: Intact without lesions or rashes.  Neurologic: Alert and oriented x 3.  Psych: Normal affect. Extremities: No clubbing or cyanosis.  HEENT: Normal.    Telemetry   NSR 70s, personally reviewed.   Labs    CBC Recent Labs    06/17/19 1347 06/19/19 0219  WBC 9.3 9.9  NEUTROABS  --  6.3  HGB 12.6 12.5  HCT 36.0 36.4  MCV 80.2 80.7  PLT 247 539   Basic Metabolic Panel Recent Labs    06/19/19 0219 06/20/19 0316  NA 136 134*  K 3.5 3.9  CL 96* 94*  CO2 31 28  GLUCOSE 118* 172*  BUN 14 16  CREATININE 0.89 0.85   CALCIUM 9.0 9.5  MG 1.5* 1.6*   Liver Function Tests No results for input(s): AST, ALT, ALKPHOS, BILITOT, PROT, ALBUMIN in the last 72 hours. No results for input(s): LIPASE, AMYLASE in the last 72 hours. Cardiac Enzymes No results for input(s): CKTOTAL, CKMB, CKMBINDEX, TROPONINI in the last 72 hours.  BNP: BNP (last 3 results) Recent Labs    08/25/18 1020 06/17/19 1957  BNP 126.8* 414.8*    ProBNP (last 3 results) No results for input(s): PROBNP in the last 8760 hours.   D-Dimer No results for input(s): DDIMER in the last 72 hours. Hemoglobin A1C Recent Labs    06/17/19 1830  HGBA1C 9.4*   Fasting Lipid Panel Recent Labs    06/17/19 1830  CHOL 89  HDL 24*  LDLCALC 41  TRIG 121  CHOLHDL 3.7   Thyroid Function Tests Recent Labs    06/17/19 1957  TSH 2.805    Other results:   Imaging    Dg Chest 2 View  Result Date: 06/19/2019 CLINICAL DATA:  Dyspnea and cough. EXAM: CHEST - 2 VIEW COMPARISON:  06/17/2019 FINDINGS: Left-sided pacemaker unchanged. Lungs are adequately inflated and demonstrate persistent mild interstitial edema with mild cardiomegaly. No effusion. Remainder the exam is unchanged. IMPRESSION:  Mild stable cardiomegaly with stable mild vascular congestion. Electronically Signed   By: Daniel  Boyle M.D.   On: 06/19/2019 11:14     Medications:     Scheduled Medications: . atorvastatin  10 mg Oral Daily  . carvedilol  6.25 mg Oral BID WC  . dextromethorphan-guaiFENesin  1 tablet Oral BID  . digoxin  0.125 mg Oral Daily  . enoxaparin (LOVENOX) injection  0.5 mg/kg Subcutaneous Q24H  . furosemide  80 mg Intravenous BID  . insulin aspart  0-15 Units Subcutaneous TID WC  . insulin aspart  0-5 Units Subcutaneous QHS  . insulin glargine  4 Units Subcutaneous Daily  . ipratropium-albuterol  3 mL Nebulization Q6H  . ivabradine  5 mg Oral BID WC  . magnesium oxide  400 mg Oral BID  . metolazone  5 mg Oral Once  . potassium chloride  40 mEq  Oral BID  . predniSONE  20 mg Oral Q breakfast  . sacubitril-valsartan  1 tablet Oral BID  . sodium chloride flush  3 mL Intravenous Q12H  . spironolactone  25 mg Oral Daily    Infusions: . sodium chloride    . 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 Admits to highlevels of water and gatorade intake.   Assessment/Plan   1. Acute on Chronic Systolic HF: Echo this admission with EF < 20%, relatively normal RV, moderate MR. NICM with Medtr[employer] ICD. Acute exacerbation in the setting of dietary indiscretion w/ sodium. Pt believes she is ~20 lb above dry weight (dry weight ~360 lb, ~380 lb yesterday). Good diuresis so far, weight coming down.  I think that she still looks volume overloaded.  SBP 100s.   - Continue IV Lasix 80 mg bid today and will give a dose of metolazone 5 mg x 1.  - With soft BP (no significant orthostatic symptoms), I decreased Coreg to 6.25 mg bid and added ivabradine 5 mg bid.  - Continue Entresto 97-103 - Continue spironolactone 25 - Continue digoxin 0.125 mg - Plan for RHC on Monday to assess filling pressures (exam is difficult) and cardiac output. Discussed risks/benefits with patient and she agreed to procedure.  2. OSA: Has been refusing CPAP here as she does not like the mask.  3. Morbid obesity: Continue to work on weight loss.  4. Asthma: Wheezy on exam with history of asthma.  No PNA on CXR.  - Started on prednisone by [facility].  - Continue Duonebs.    Length of Stay: 3  Arjay Jaskiewicz, MD  06/20/2019, 7:27 AM  Advanced Heart Failure Team Pager  [phone] (M-F; 7a - 4p)  Please contact [facility] for night-coverage after hours (4p -7a ) and weekends on [email or web address]   

## 2019-06-20 NOTE — Progress Notes (Signed)
Inpatient Diabetes Program Recommendations  AACE/ADA: New Consensus Statement on Inpatient Glycemic Control (2015)  Target Ranges:  Prepandial:   less than 140 mg/dL      Peak postprandial:   less than 180 mg/dL (1-2 hours)      Critically ill patients:  140 - 180 mg/dL   Lab Results  Component Value Date   GLUCAP 129 (H) 06/20/2019   HGBA1C 9.4 (H) 06/17/2019    Review of Glycemic Control  Diabetes history: DM 2 Outpatient Diabetes medications: No medications at home Current orders for Inpatient glycemic control:  Lantus 4 units Daily Novolog 0-15 units + hs  A1c 9.4% this admission  Inpatient Diabetes Program Recommendations:    Consult regarding education as A1c is elevated in the setting of heart disease. Patient will be here Monday Oct 5 for heart cath. Patient on low dose of Lantus 4 units. Fasting glucose 120's this am.   Patient with [employer]. Patient may benefit from oral medications at home such as a sulfonylurea since pt is on such a low dose of Lantus. Due to BMI, unsure if an SGLT2 would be appropriate due to risk of genitourinary infections.  DM Coordinator to follow up with patient on 10/5 regarding lifestyle modifications and DM teaching.  Will follow pt.  Thanks,  Tama Headings RN, MSN, BC-ADM Inpatient Diabetes Coordinator Team Pager [phone] (8a-5p)

## 2019-06-20 NOTE — Consult Note (Signed)
[facility]  PROGRESS NOTE    Kirsten Wheeler  MEQ:[id number] DOB: January 09, 1981 DOA: 06/17/2019 PCP: Kirsten Blase, PA-C   Requesting physician: Dr. Shirlee Latch  Reason for consultation: Medical Mgmt    Brief Narrative:   Kirsten Fountainis a 38 y.o.femalewith medical history significant of postpartum cardiomyopathy status post ICD, obstructive sleep apnea on CPAP, tobacco abuse, morbid obesity with BMI of 62, asthma presents to emergency department due to shortness of breath, leg swelling since 4 days. Reports association with orthopnea, PND, palpitation, cough, congestion however denies chest pain, fever, chills, runny nose, sore throat, recent COVID-19 exposure, headache, blurry vision, lightheadedness, dizziness, nausea, vomiting, abdominal pain, diarrhea, constipation, sleep or appetite changes.  10/2: Started on lasix 80mg  IV BID; feels like she is improving 10/3: C/o cough at night and racing heart; otherwise ok ON 10/4: Says she is breathing better today. Denies complaints ON.   Assessment & Plan:   Principal Problem:   Acute on chronic systolic CHF (congestive heart failure) (HCC) Active Problems:   Morbid obesity- BMI 57   Peripartum cardiomyopathy   NICM (nonischemic cardiomyopathy) (HCC)   Tobacco abuse   Asthma   Hypokalemia   Obstructive sleep apnea   ICD (implantable cardioverter-defibrillator) in place   Diabetes mellitus (HCC)   Acute on chronic CHF: - Due to severe nonischemic cardiomyopathy. Has ICD placed in 2018. - Patient presented with symptoms of fluid overload, chest x-ray reviewed which showed cardiomegaly-suspect mild CHF. - on IV Lasix 80 mg IV twice daily - echo from 7/19 which showed ejection fraction of 20 to 25%. - cards onboard; have agreed to take over service, appreciate their help; [facility] will stay on a consult for medical issues (asthma, DM2)     - per cards  Asthma: - CXR negative - continue PRN duonebs - sats good  on RA     - she is a little wheezy at the bases today; will add short burst steroids and check repeat 2-view CXR     - CXR w/ vascular congestion     - 10/4: wheeze is "glassy"; question if this is more cardiac wheeze than not; will hold steroids today  Hypokalemia Hypomagnesemia: - K+ 3.5; continue current regimen - Mg2+ is low; will give IV Mg2+     - monitor     - 10/4: Mag is low again today; adding BID PO Mg2+; K+ ok, monitor  Obstructive sleep apnea:  - On CPAP at home - We will continue CPAP while she is in the hospital.     - didn't wear it last night because she didn't like the mask; encouraged to use regardless, can ask respiratory if there are other masks available     - 10/4: again refused CPAP last night; counseled on compliance  Morbid obesity:  - With BMI of 62. - Patient needs extensive counseling regarding dietary modification, exercise and weight loss.  Tobacco abuse: - cessation encouraged  Type 2 diabetes mellitus: - A1c is 9.4 - SSI - consult DM ed     - start lantus 4 units qday; titrate as necessary     - 10/4: continue current lantus dose; glucose is acceptable  DVT prophylaxis: lovenox Code Status: FULL    ROS:  Denies CP, palpitations, N, V. Reports improved dyspnea . Remainder 10-pt ROS is negative for all not previously mentioned.  Subjective: "I'm breathing better."  Objective: Vitals:   06/20/19 0321 06/20/19 0400 06/20/19 0739 06/20/19 0750  BP: 105/81   (!) 156/126  Pulse:  79   82  Resp: (!) 25 (!) 22    Temp: 98.1 F (36.7 C)   97.7 F (36.5 C)  TempSrc: Oral     SpO2: 98%  98% 96%  Weight: (!) 168.5 kg     Height:        Intake/Output Summary (Last 24 hours) at 06/20/2019 0838 Last data filed at 06/20/2019 0802 Gross per 24 hour  Intake 838 ml  Output 4200 ml  Net -3362 ml   Filed Weights   06/18/19 1302 06/19/19 0238 06/20/19 0321  Weight: (!) 171.3 kg (!) 170.7 kg (!) 168.5  kg    Examination:  General:38 y.o.femaleresting in bed in NAD Eyes: PERRL, normal sclera ENMT: Nares patent w/o discharge, orophaynx clear, dentition normal, ears w/o discharge/lesions/ulcers Cardiovascular: RRR, +S1, S2, no m/g/r, equal pulses throughout Respiratory: glassy exp wheeze noted b/l bases, normal WOB GI: BS+,obese,NDNT, no masses noted, no organomegaly noted MSK: No c/c; BLE edema Skin: No rashes, bruises, ulcerations noted Neuro: Alert to name, follows commands Psyc: Appropriate interaction and affect, calm/cooperative   Data Reviewed: I have personally reviewed following labs and imaging studies.  CBC: Recent Labs  Lab 06/17/19 1347 06/19/19 0219  WBC 9.3 9.9  NEUTROABS  --  6.3  HGB 12.6 12.5  HCT 36.0 36.4  MCV 80.2 80.7  PLT 247 045   Basic Metabolic Panel: Recent Labs  Lab 06/17/19 1347 06/17/19 1830 06/17/19 1957 06/18/19 1230 06/19/19 0219 06/20/19 0316  NA 136  --   --  136 136 134*  K 3.3*  --   --  3.1* 3.5 3.9  CL 97*  --   --  98 96* 94*  CO2 28  --   --  28 31 28   GLUCOSE 231*  --   --  183* 118* 172*  BUN 15  --   --  13 14 16   CREATININE 1.04*  --   --  0.93 0.89 0.85  CALCIUM 8.3*  --   --  8.6* 9.0 9.5  MG  --  1.4* 1.4*  --  1.5* 1.6*   GFR: Estimated Creatinine Clearance: 146.8 mL/min (by C-G formula based on SCr of 0.85 mg/dL). Liver Function Tests: No results for input(s): AST, ALT, ALKPHOS, BILITOT, PROT, ALBUMIN in the last 168 hours. No results for input(s): LIPASE, AMYLASE in the last 168 hours. No results for input(s): AMMONIA in the last 168 hours. Coagulation Profile: No results for input(s): INR, PROTIME in the last 168 hours. Cardiac Enzymes: No results for input(s): CKTOTAL, CKMB, CKMBINDEX, TROPONINI in the last 168 hours. BNP (last 3 results) No results for input(s): PROBNP in the last 8760 hours. HbA1C: Recent Labs    06/17/19 1830  HGBA1C 9.4*   CBG: Recent Labs  Lab 06/19/19 0614 06/19/19  1142 06/19/19 1555 06/19/19 2117 06/20/19 0614  GLUCAP 156* 164* 180* 215* 129*   Lipid Profile: Recent Labs    06/17/19 1830  CHOL 89  HDL 24*  LDLCALC 41  TRIG 121  CHOLHDL 3.7   Thyroid Function Tests: Recent Labs    06/17/19 1957  TSH 2.805   Anemia Panel: No results for input(s): VITAMINB12, FOLATE, FERRITIN, TIBC, IRON, RETICCTPCT in the last 72 hours. Sepsis Labs: No results for input(s): PROCALCITON, LATICACIDVEN in the last 168 hours.  Recent Results (from the past 240 hour(s))  SARS CORONAVIRUS 2 (TAT 6-24 HRS) Nasopharyngeal Nasopharyngeal Swab     Status: None   Collection Time: 06/17/19  5:18 PM  Specimen: Nasopharyngeal Swab  Result Value Ref Range Status   SARS Coronavirus 2 NEGATIVE NEGATIVE Final    Comment: (NOTE) SARS-CoV-2 target nucleic acids are NOT DETECTED. The SARS-CoV-2 RNA is generally detectable in upper and lower respiratory specimens during the acute phase of infection. Negative results do not preclude SARS-CoV-2 infection, do not rule out co-infections with other pathogens, and should not be used as the sole basis for treatment or other patient management decisions. Negative results must be combined with clinical observations, patient history, and epidemiological information. The expected result is Negative. Fact Sheet for Patients: HairSlick.no Fact Sheet for Healthcare Providers: quierodirigir.com This test is not yet approved or cleared by the [place] FDA and  has been authorized for detection and/or diagnosis of SARS-CoV-2 by FDA under an Emergency Use Authorization (EUA). This EUA will remain  in effect (meaning this test can be used) for the duration of the COVID-19 declaration under Section 56 4(b)(1) of the Act, 21 U.S.C. section 360bbb-3(b)(1), unless the authorization is terminated or revoked sooner. Performed at [facility], [address],  [place], [place] [address]   MRSA PCR Screening     Status: None   Collection Time: 06/18/19 12:59 PM   Specimen: Nasopharyngeal  Result Value Ref Range Status   MRSA by PCR NEGATIVE NEGATIVE Final    Comment:        The GeneXpert MRSA Assay (FDA approved for NASAL specimens only), is one component of a comprehensive MRSA colonization surveillance program. It is not intended to diagnose MRSA infection nor to guide or monitor treatment for MRSA infections. Performed at [facility], [address], [place], [place] [address]       Radiology Studies: Dg Chest 2 View  Result Date: 06/19/2019 CLINICAL DATA:  Dyspnea and cough. EXAM: CHEST - 2 VIEW COMPARISON:  06/17/2019 FINDINGS: Left-sided pacemaker unchanged. Lungs are adequately inflated and demonstrate persistent mild interstitial edema with mild cardiomegaly. No effusion. Remainder the exam is unchanged. IMPRESSION: Mild stable cardiomegaly with stable mild vascular congestion. Electronically Signed   By: Elberta Fortis M.D.   On: 06/19/2019 11:14     Scheduled Meds: . atorvastatin  10 mg Oral Daily  . carvedilol  6.25 mg Oral BID WC  . dextromethorphan-guaiFENesin  1 tablet Oral BID  . digoxin  0.125 mg Oral Daily  . enoxaparin (LOVENOX) injection  0.5 mg/kg Subcutaneous Q24H  . furosemide  80 mg Intravenous BID  . insulin aspart  0-15 Units Subcutaneous TID WC  . insulin aspart  0-5 Units Subcutaneous QHS  . insulin glargine  4 Units Subcutaneous Daily  . ipratropium-albuterol  3 mL Nebulization Q6H  . ivabradine  5 mg Oral BID WC  . magnesium oxide  400 mg Oral BID  . potassium chloride  40 mEq Oral BID  . sacubitril-valsartan  1 tablet Oral BID  . sodium chloride flush  3 mL Intravenous Q12H  . spironolactone  25 mg Oral Daily   Continuous Infusions: . sodium chloride    . magnesium sulfate bolus IVPB       LOS: 3 days    Thank you for this consult. [facility] will continue to follow with you.  Time spent: 25  minutes spent in the coordination of care today.    Teddy Spike, DO [facility] Pager [phone]  If 7PM-7AM, please contact night-coverage [email or web address] Password [email or web address] 06/20/2019, 8:38 AM

## 2019-06-20 NOTE — H&P (View-Only) (Signed)
[name], female   DOB: 07-21-1981, 38 y.o.   MRN: [id number]     Advanced Heart Failure Rounding Note  PCP-Cardiologist: No primary care provider on file.   Subjective:    Breathing is getting better.  HR lower on ivabradine.  SBP stable in 100s.  Still some wheezing.  Weight down, not sure all I/Os are getting recorded.   CXR: Vascular congestion, no PNA.   Objective:   Weight Range: (!) 168.5 kg Body mass index is 61.82 kg/m.   Vital Signs:   Temp:  [97.6 F (36.4 C)-98.2 F (36.8 C)] 98.1 F (36.7 C) (10/04 0321) Pulse Rate:  [75-91] 79 (10/04 0321) Resp:  [22-25] 22 (10/04 0400) BP: (90-118)/(60-81) 105/81 (10/04 0321) SpO2:  [95 %-98 %] 98 % (10/04 0321) Weight:  [168.5 kg] 168.5 kg (10/04 0321) Last BM Date: 06/19/19  Weight change: Filed Weights   06/18/19 1302 06/19/19 0238 06/20/19 0321  Weight: (!) 171.3 kg (!) 170.7 kg (!) 168.5 kg    Intake/Output:   Intake/Output Summary (Last 24 hours) at 06/20/2019 0727 Last data filed at 06/20/2019 0400 Gross per 24 hour  Intake 960 ml  Output 1500 ml  Net -540 ml      Physical Exam    General: NAD, obese.  Neck: JVP 10-12 range, no thyromegaly or thyroid nodule.  Lungs: End expiratory wheezes CV: Nonpalpable PMI.  Heart regular S1/S2, no S3/S4, no murmur.  1+ edema 1/2 to knees bilaterally.   Abdomen: Soft, nontender, no hepatosplenomegaly, no distention.  Skin: Intact without lesions or rashes.  Neurologic: Alert and oriented x 3.  Psych: Normal affect. Extremities: No clubbing or cyanosis.  HEENT: Normal.    Telemetry   NSR 70s, personally reviewed.   Labs    CBC Recent Labs    06/17/19 1347 06/19/19 0219  WBC 9.3 9.9  NEUTROABS  --  6.3  HGB 12.6 12.5  HCT 36.0 36.4  MCV 80.2 80.7  PLT 247 539   Basic Metabolic Panel Recent Labs    06/19/19 0219 06/20/19 0316  NA 136 134*  K 3.5 3.9  CL 96* 94*  CO2 31 28  GLUCOSE 118* 172*  BUN 14 16  CREATININE 0.89 0.85   CALCIUM 9.0 9.5  MG 1.5* 1.6*   Liver Function Tests No results for input(s): AST, ALT, ALKPHOS, BILITOT, PROT, ALBUMIN in the last 72 hours. No results for input(s): LIPASE, AMYLASE in the last 72 hours. Cardiac Enzymes No results for input(s): CKTOTAL, CKMB, CKMBINDEX, TROPONINI in the last 72 hours.  BNP: BNP (last 3 results) Recent Labs    08/25/18 1020 06/17/19 1957  BNP 126.8* 414.8*    ProBNP (last 3 results) No results for input(s): PROBNP in the last 8760 hours.   D-Dimer No results for input(s): DDIMER in the last 72 hours. Hemoglobin A1C Recent Labs    06/17/19 1830  HGBA1C 9.4*   Fasting Lipid Panel Recent Labs    06/17/19 1830  CHOL 89  HDL 24*  LDLCALC 41  TRIG 121  CHOLHDL 3.7   Thyroid Function Tests Recent Labs    06/17/19 1957  TSH 2.805    Other results:   Imaging    Dg Chest 2 View  Result Date: 06/19/2019 CLINICAL DATA:  Dyspnea and cough. EXAM: CHEST - 2 VIEW COMPARISON:  06/17/2019 FINDINGS: Left-sided pacemaker unchanged. Lungs are adequately inflated and demonstrate persistent mild interstitial edema with mild cardiomegaly. No effusion. Remainder the exam is unchanged. IMPRESSION:  Mild stable cardiomegaly with stable mild vascular congestion. Electronically Signed   By: Elberta Fortis M.D.   On: 06/19/2019 11:14     Medications:     Scheduled Medications: . atorvastatin  10 mg Oral Daily  . carvedilol  6.25 mg Oral BID WC  . dextromethorphan-guaiFENesin  1 tablet Oral BID  . digoxin  0.125 mg Oral Daily  . enoxaparin (LOVENOX) injection  0.5 mg/kg Subcutaneous Q24H  . furosemide  80 mg Intravenous BID  . insulin aspart  0-15 Units Subcutaneous TID WC  . insulin aspart  0-5 Units Subcutaneous QHS  . insulin glargine  4 Units Subcutaneous Daily  . ipratropium-albuterol  3 mL Nebulization Q6H  . ivabradine  5 mg Oral BID WC  . magnesium oxide  400 mg Oral BID  . metolazone  5 mg Oral Once  . potassium chloride  40 mEq  Oral BID  . predniSONE  20 mg Oral Q breakfast  . sacubitril-valsartan  1 tablet Oral BID  . sodium chloride flush  3 mL Intravenous Q12H  . spironolactone  25 mg Oral Daily    Infusions: . sodium chloride    . 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 Admits to highlevels of water and gatorade intake.   Assessment/Plan   1. Acute on Chronic Systolic HF: Echo this admission with EF < 20%, relatively normal RV, moderate MR. NICM with [employer] ICD. Acute exacerbation in the setting of dietary indiscretion w/ sodium. Pt believes she is ~20 lb above dry weight (dry weight ~360 lb, ~380 lb yesterday). Good diuresis so far, weight coming down.  I think that she still looks volume overloaded.  SBP 100s.   - Continue IV Lasix 80 mg bid today and will give a dose of metolazone 5 mg x 1.  - With soft BP (no significant orthostatic symptoms), I decreased Coreg to 6.25 mg bid and added ivabradine 5 mg bid.  - Continue Entresto 97-103 - Continue spironolactone 25 - Continue digoxin 0.125 mg - Plan for RHC on Monday to assess filling pressures (exam is difficult) and cardiac output. Discussed risks/benefits with patient and she agreed to procedure.  2. OSA: Has been refusing CPAP here as she does not like the mask.  3. Morbid obesity: Continue to work on weight loss.  4. Asthma: Wheezy on exam with history of asthma.  No PNA on CXR.  - Started on prednisone by [facility].  - Continue Duonebs.    Length of Stay: 3  Marca Ancona, MD  06/20/2019, 7:27 AM  Advanced Heart Failure Team Pager  [phone] (M-F; 7a - 4p)  Please contact [facility] for night-coverage after hours (4p -7a ) and weekends on [email or web address]

## 2019-06-21 ENCOUNTER — Encounter (HOSPITAL_COMMUNITY)
Admission: EM | Disposition: A | Discharge status: Home Health | Payer: Self-pay | Source: Home / Self Care | Attending: Cardiology

## 2019-06-21 ENCOUNTER — Encounter (HOSPITAL_COMMUNITY): Payer: Self-pay | Admitting: Cardiology

## 2019-06-21 ENCOUNTER — Encounter (HOSPITAL_COMMUNITY): Payer: Medicaid Other

## 2019-06-21 DIAGNOSIS — I5023 Acute on chronic systolic (congestive) heart failure: Secondary | ICD-10-CM

## 2019-06-21 DIAGNOSIS — O903 Peripartum cardiomyopathy: Secondary | ICD-10-CM

## 2019-06-21 HISTORY — PX: RIGHT HEART CATH: CATH118263

## 2019-06-21 LAB — POCT I-STAT EG7
Acid-Base Excess: 7 mmol/L — ABNORMAL HIGH (ref 0.0–2.0)
Acid-Base Excess: 7 mmol/L — ABNORMAL HIGH (ref 0.0–2.0)
Bicarbonate: 34 mmol/L — ABNORMAL HIGH (ref 20.0–28.0)
Bicarbonate: 34.1 mmol/L — ABNORMAL HIGH (ref 20.0–28.0)
Calcium, Ion: 1.28 mmol/L (ref 1.15–1.40)
Calcium, Ion: 1.24 mmol/L (ref 1.15–1.40)
HCT: 43 % (ref 36.0–46.0)
HCT: 43 % (ref 36.0–46.0)
Hemoglobin: 14.6 g/dL (ref 12.0–15.0)
Hemoglobin: 14.6 g/dL (ref 12.0–15.0)
O2 Saturation: 56 %
O2 Saturation: 57 %
Potassium: 3.5 mmol/L (ref 3.5–5.1)
Potassium: 3.4 mmol/L — ABNORMAL LOW (ref 3.5–5.1)
Sodium: 138 mmol/L (ref 135–145)
Sodium: 139 mmol/L (ref 135–145)
TCO2: 36 mmol/L — ABNORMAL HIGH (ref 22–32)
TCO2: 36 mmol/L — ABNORMAL HIGH (ref 22–32)
pCO2, Ven: 55.6 mmHg (ref 44.0–60.0)
pCO2, Ven: 55.9 mmHg (ref 44.0–60.0)
pH, Ven: 7.394 (ref 7.250–7.430)
pH, Ven: 7.393 (ref 7.250–7.430)
pO2, Ven: 30 mmHg — CL (ref 32.0–45.0)
pO2, Ven: 31 mmHg — CL (ref 32.0–45.0)

## 2019-06-21 LAB — BLOOD GAS, ARTERIAL
Acid-Base Excess: 6.2 mmol/L — ABNORMAL HIGH (ref 0.0–2.0)
Bicarbonate: 30.4 mmol/L — ABNORMAL HIGH (ref 20.0–28.0)
Drawn by: 275531
FIO2: 0.21
O2 Saturation: 96 %
Patient temperature: 98.6
pCO2 arterial: 44.9 mmHg (ref 32.0–48.0)
pH, Arterial: 7.445 (ref 7.350–7.450)
pO2, Arterial: 85.5 mmHg (ref 83.0–108.0)

## 2019-06-21 LAB — BASIC METABOLIC PANEL
Anion gap: 11 (ref 5–15)
BUN: 23 mg/dL — ABNORMAL HIGH (ref 6–20)
CO2: 30 mmol/L (ref 22–32)
Calcium: 10.2 mg/dL (ref 8.9–10.3)
Chloride: 92 mmol/L — ABNORMAL LOW (ref 98–111)
Creatinine, Ser: 1.07 mg/dL — ABNORMAL HIGH (ref 0.44–1.00)
GFR calc Af Amer: >60 mL/min (ref >60)
GFR calc non Af Amer: >60 mL/min (ref >60)
Glucose, Bld: 180 mg/dL — ABNORMAL HIGH (ref 70–99)
Potassium: 3.8 mmol/L (ref 3.5–5.1)
Sodium: 133 mmol/L — ABNORMAL LOW (ref 135–145)

## 2019-06-21 LAB — GLUCOSE, CAPILLARY
Glucose-Capillary: 146 mg/dL — ABNORMAL HIGH (ref 70–99)
Glucose-Capillary: 176 mg/dL — ABNORMAL HIGH (ref 70–99)
Glucose-Capillary: 179 mg/dL — ABNORMAL HIGH (ref 70–99)
Glucose-Capillary: 189 mg/dL — ABNORMAL HIGH (ref 70–99)
Glucose-Capillary: 216 mg/dL — ABNORMAL HIGH (ref 70–99)

## 2019-06-21 LAB — URINALYSIS, ROUTINE W REFLEX MICROSCOPIC
Bilirubin Urine: NEGATIVE
Glucose, UA: NEGATIVE mg/dL
Ketones, ur: NEGATIVE mg/dL
Leukocytes,Ua: NEGATIVE
Nitrite: NEGATIVE
Protein, ur: NEGATIVE mg/dL
RBC / HPF: >50 RBC/hpf — ABNORMAL HIGH (ref 0–5)
Specific Gravity, Urine: 1.01 (ref 1.005–1.030)
pH: 7 (ref 5.0–8.0)

## 2019-06-21 LAB — RAPID URINE DRUG SCREEN, HOSP PERFORMED
Amphetamines: NOT DETECTED
Barbiturates: NOT DETECTED
Benzodiazepines: NOT DETECTED
Cocaine: NOT DETECTED
Opiates: NOT DETECTED
Tetrahydrocannabinol: NOT DETECTED

## 2019-06-21 SURGERY — RIGHT HEART CATH
Anesthesia: LOCAL

## 2019-06-21 MED ORDER — LIDOCAINE HCL (PF) 1 % IJ SOLN
INTRAMUSCULAR | Status: AC
  Filled 2019-06-21: qty 30

## 2019-06-21 MED ORDER — FUROSEMIDE 10 MG/ML IJ SOLN
80.0000 mg | Freq: Two times a day (BID) | INTRAMUSCULAR | Status: DC
  Administered 2019-06-21 – 2019-06-22 (×4): 80 mg via INTRAVENOUS
  Filled 2019-06-21 (×4): qty 8

## 2019-06-21 MED ORDER — HEPARIN (PORCINE) IN NACL 1000-0.9 UT/500ML-% IV SOLN
INTRAVENOUS | Status: AC
  Filled 2019-06-21: qty 500

## 2019-06-21 MED ORDER — METOLAZONE 2.5 MG PO TABS
2.5000 mg | ORAL_TABLET | Freq: Once | ORAL | Status: AC
  Administered 2019-06-21: 2.5 mg via ORAL
  Filled 2019-06-21: qty 1

## 2019-06-21 MED ORDER — LIDOCAINE HCL (PF) 1 % IJ SOLN
INTRAMUSCULAR | Status: DC | PRN
  Administered 2019-06-21: 2 mL

## 2019-06-21 MED ORDER — CARVEDILOL 3.125 MG PO TABS
3.1250 mg | ORAL_TABLET | Freq: Two times a day (BID) | ORAL | Status: DC
  Administered 2019-06-21 – 2019-06-25 (×8): 3.125 mg via ORAL
  Filled 2019-06-21 (×8): qty 1

## 2019-06-21 MED ORDER — HEPARIN (PORCINE) IN NACL 1000-0.9 UT/500ML-% IV SOLN
INTRAVENOUS | Status: DC | PRN
  Administered 2019-06-21: 500 mL

## 2019-06-21 MED ORDER — MILRINONE LACTATE IN DEXTROSE 20-5 MG/100ML-% IV SOLN
0.2500 ug/kg/min | INTRAVENOUS | Status: DC
  Administered 2019-06-21 – 2019-06-25 (×10): 0.25 ug/kg/min via INTRAVENOUS
  Filled 2019-06-21 (×11): qty 100

## 2019-06-21 SURGICAL SUPPLY — 7 items
CATH BALLN WEDGE 5F 110CM (CATHETERS) ×1 IMPLANT
HOVERMATT SINGLE USE (MISCELLANEOUS) ×1 IMPLANT
PACK CARDIAC CATHETERIZATION (CUSTOM PROCEDURE TRAY) ×2 IMPLANT
SHEATH GLIDE SLENDER 4/5FR (SHEATH) ×1 IMPLANT
TRANSDUCER W/STOPCOCK (MISCELLANEOUS) ×2 IMPLANT
TUBING ART PRESS 72  MALE/FEM (TUBING) ×1
TUBING ART PRESS 72 MALE/FEM (TUBING) IMPLANT

## 2019-06-21 NOTE — Progress Notes (Signed)
Physical Therapy Treatment Patient Details Name: Kirsten Wheeler MRN: [id number] DOB: September 04, 1981 Today's Date: 06/21/2019    History of Present Illness Pt is a 38 y/o female admitted secondary to worsening SOB. Thought to be secondary to CHF exacerbation. s/p cardiac cath 10/5. PMH includes obesity, asthma, s/p ICD, DM, CHF, and OSA on CPAP.    PT Comments    Patient progressing well towards PT goals. Pt reports feeling sad today. Improved ambulation distance with supervision for safety. Pt reports dizziness during mobility. Pre activity BP 84/58 and post activity BP 82/53. Sp02 stable and >95% throughout. Explained importance of short bursts of activity and walking halls multiple times per day with nursing to improve overall endurance and strength. Pt agreeable. Will plan for stair training next session. Will follow.   Follow Up Recommendations  No PT follow up     Equipment Recommendations  None recommended by PT    Recommendations for Other Services       Precautions / Restrictions Precautions Precautions: Other (comment) Precaution Comments: watch BP Restrictions Weight Bearing Restrictions: No    Mobility  Bed Mobility               General bed mobility comments: Sitting EOB upon entry.   Transfers Overall transfer level: Needs assistance Equipment used: None Transfers: Sit to/from Stand Sit to Stand: Modified independent (Device/Increase time)         General transfer comment: Able to stand without issues or assist from EOB.  Ambulation/Gait Ambulation/Gait assistance: Supervision Gait Distance (Feet): 120 Feet Assistive device: None Gait Pattern/deviations: Step-through pattern;Decreased stride length;Wide base of support Gait velocity: Decreased   General Gait Details: Guarded gait, and easily fatigued. SP02 >95% on RA. HR stable. Dizziness during ambulation.   Stairs             Wheelchair Mobility    Modified Rankin (Stroke Patients  Only)       Balance Overall balance assessment: No apparent balance deficits (not formally assessed)                                          Cognition Arousal/Alertness: Awake/alert Behavior During Therapy: WFL for tasks assessed/performed Overall Cognitive Status: Within Functional Limits for tasks assessed                                 General Comments: Pt reports feeling sad today.      Exercises      General Comments General comments (skin integrity, edema, etc.): Bp pre activity 84/58, BP post activity 82/53 with dizziness.      Pertinent Vitals/Pain Pain Assessment: No/denies pain    Home Living                      Prior Function            PT Goals (current goals can now be found in the care plan section) Progress towards PT goals: Progressing toward goals    Frequency    Min 3X/week      PT Plan Current plan remains appropriate    Co-evaluation              AM-PAC PT "6 Clicks" Mobility   Outcome Measure  Help needed turning from your back to your side while in a flat  bed without using bedrails?: None Help needed moving from lying on your back to sitting on the side of a flat bed without using bedrails?: A Little Help needed moving to and from a bed to a chair (including a wheelchair)?: None Help needed standing up from a chair using your arms (e.g., wheelchair or bedside chair)?: A Little Help needed to walk in hospital room?: A Little Help needed climbing 3-5 steps with a railing? : A Little 6 Click Score: 20    End of Session   Activity Tolerance: Patient limited by fatigue;Other (comment)(dizziness) Patient left: in bed;with call bell/phone within reach(sitting EOB) Nurse Communication: Mobility status PT Visit Diagnosis: Difficulty in walking, not elsewhere classified (R26.2)     Time: 3244-0102 PT Time Calculation (min) (ACUTE ONLY): 16 min  Charges:  $Gait Training: 8-22 mins                      Wray Kearns, PT, DPT Acute Rehabilitation Services Pager [phone] Office [phone]       [place] 06/21/2019, 2:38 PM

## 2019-06-21 NOTE — Progress Notes (Signed)
  Was called to bedside given low SBP in the 80s. Pt completely asymptomatic. Sitting up on side of bed eating and talking on the phone. No dizziness, syncope/ near syncope. Dr. Aundra Dubin notified. Pt has had low BP throughout most of the admission. No indication for IVF bolus at this time. Will plan to reduce Coreg dose to 3.125 mg bid. Hold parameters for Entresto and Coreg only if SBP < 85 placed in order.   Lyda Jester, PA-C 06/21/2019

## 2019-06-21 NOTE — Progress Notes (Signed)
Inpatient Diabetes Program Recommendations  AACE/ADA: New Consensus Statement on Inpatient Glycemic Control (2015)  Target Ranges:  Prepandial:   less than 140 mg/dL      Peak postprandial:   less than 180 mg/dL (1-2 hours)      Critically ill patients:  140 - 180 mg/dL   Lab Results  Component Value Date   GLUCAP 176 (H) 06/21/2019   HGBA1C 9.4 (H) 06/17/2019    Review of Glycemic Control Results for Kirsten Wheeler, Kirsten Wheeler (MRN [id number]) as of 06/21/2019 14:31  Ref. Range 06/21/2019 00:08 06/21/2019 04:08 06/21/2019 10:44  Glucose-Capillary Latest Ref Range: 70 - 99 mg/dL 216 (H) 146 (H) 176 (H)   Diabetes history: Type 2 DM Outpatient Diabetes medications: Metformin 500 mg BID Current orders for Inpatient glycemic control: Lantus 4 units QD, Novolog 0-15 units TID, Novolog 0-5 units QHS  Inpatient Diabetes Program Recommendations:    Spoke with patient regarding outpatient DM managemgent. Verified home medications.  Reviewed patient's current A1c of 9.4%. Explained what a A1c is and what it measures. Also reviewed goal A1c with patient, importance of good glucose control @ home, and blood sugar goals. Reviewed patho of DM, role of pancreas, impact of poor glycemic control with heart function, discussed adding sulfonyurea, survival skills, vascular changes and comorbidities. Patient recently obtained a meter and has been checking CBGs 2 times per day (range- 180-230 mg/dL). Patient admits to drinking ginger ale, she states, "But that is not really soda." Reviewed with patient carbohydrates within beverages and encouraged finding alternatives to sugary beverages. Consult for dietitian placed to help review and reinforce. Stressed the importance of following up with PCP in next few weeks. Patient has no further questions at this time.  Per DM coordinator note from 06/20/2019, "Patient with [employer]. Patient may benefit from oral medications at home such as a sulfonylurea since pt is on  such a low dose of Lantus. Due to BMI, unsure if an SGLT2 would be appropriate due to risk of genitourinary infections."  Thanks, Bronson Curb, MSN, RNC-OB Diabetes Coordinator [phone] (8a-5p)

## 2019-06-21 NOTE — Interval H&P Note (Signed)
History and Physical Interval Note:  06/21/2019 7:44 AM  Kirsten Wheeler Plan  has presented today for surgery, with the diagnosis of heart failure.  The various methods of treatment have been discussed with the Kirsten Wheeler and family. After consideration of risks, benefits and other options for treatment, the Kirsten Wheeler has consented to  Procedure(s): RIGHT HEART CATH (N/A) as a surgical intervention.  The Kirsten Wheeler's history has been reviewed, Kirsten Wheeler examined, no change in status, stable for surgery.  I have reviewed the Kirsten Wheeler's chart and labs.  Questions were answered to the Kirsten Wheeler's satisfaction.     Telly Broberg [employer]

## 2019-06-21 NOTE — Plan of Care (Signed)
  Problem: Education: Goal: Knowledge of General Education information will improve Description: Including pain rating scale, medication(s)/side effects and non-pharmacologic comfort measures Outcome: Progressing   Problem: Health Behavior/Discharge Planning: Goal: Ability to manage health-related needs will improve Outcome: Progressing   Problem: Clinical Measurements: Goal: Ability to maintain clinical measurements within normal limits will improve Outcome: Progressing Goal: Will remain free from infection Outcome: Progressing Goal: Diagnostic test results will improve Outcome: Progressing Goal: Respiratory complications will improve Outcome: Progressing Goal: Cardiovascular complication will be avoided Outcome: Progressing   Problem: Clinical Measurements: Goal: Will remain free from infection Outcome: Progressing   Problem: Activity: Goal: Risk for activity intolerance will decrease Outcome: Progressing   Problem: Nutrition: Goal: Adequate nutrition will be maintained Outcome: Progressing   Problem: Coping: Goal: Level of anxiety will decrease Outcome: Progressing   Problem: Elimination: Goal: Will not experience complications related to bowel motility Outcome: Progressing Goal: Will not experience complications related to urinary retention Outcome: Progressing   

## 2019-06-21 NOTE — Plan of Care (Signed)
  Problem: Clinical Measurements: Goal: Will remain free from infection Outcome: Progressing   Problem: Clinical Measurements: Goal: Respiratory complications will improve Outcome: Progressing   Problem: Clinical Measurements: Goal: Cardiovascular complication will be avoided Outcome: Progressing   

## 2019-06-21 NOTE — Progress Notes (Addendum)
[name], female   DOB: October 11, 1980, 38 y.o.   MRN: [id number]     Advanced Heart Failure Rounding Note  PCP-Cardiologist: No primary care provider on file.   Subjective:    Very good diuresis yesterday, weight down 6 lbs.  HR lower on ivabradine.  SBP stable in 100s (lower read overnight but in 110s here in cath lab).  Still some wheezing and coughing.    CXR: Vascular congestion, no PNA.   RHC Procedural Findings (10/5): Hemodynamics (mmHg) RA mean 7 RV 49/10 PA 65/25 mean 45 PCWP mean 27 Oxygen saturations: PA 57% AO 100% Cardiac Output (Fick) 4.65  Cardiac Index (Fick) 1.82 PVR 3.87 WU PAPI 5.7  Objective:   Weight Range: (!) 165.6 kg Body mass index is 60.75 kg/m.   Vital Signs:   Temp:  [97.5 F (36.4 C)-98 F (36.7 C)] 97.9 F (36.6 C) (10/05 0716) Pulse Rate:  [77-99] 89 (10/05 0757) Resp:  [6-29] 23 (10/05 0757) BP: (77-123)/(51-94) 123/94 (10/05 0757) SpO2:  [92 %-100 %] 100 % (10/05 0757) Weight:  [165.6 kg] 165.6 kg (10/05 0010) Last BM Date: 06/19/19  Weight change: Filed Weights   06/19/19 0238 06/20/19 0321 06/21/19 0010  Weight: (!) 170.7 kg (!) 168.5 kg (!) 165.6 kg    Intake/Output:   Intake/Output Summary (Last 24 hours) at 06/21/2019 0815 Last data filed at 06/20/2019 2300 Gross per 24 hour  Intake 1106.61 ml  Output 3600 ml  Net -2493.39 ml      Physical Exam    General: NAD Neck: JVP difficult but appears elevated, no thyromegaly or thyroid nodule.  Lungs: Rhonchi.  CV: Nonpalpable PMI.  Heart regular S1/S2, no S3/S4, no murmur.  1+ ankle edema.   Abdomen: Soft, nontender, no hepatosplenomegaly, no distention.  Skin: Intact without lesions or rashes.  Neurologic: Alert and oriented x 3.  Psych: Normal affect. Extremities: No clubbing or cyanosis.  HEENT: Normal.    Telemetry   NSR 70s, personally reviewed.   Labs    CBC Recent Labs    06/19/19 0219  WBC 9.9  NEUTROABS 6.3  HGB 12.5  HCT 36.4   MCV 80.7  PLT 175   Basic Metabolic Panel Recent Labs    06/19/19 0219 06/20/19 0316 06/21/19 0227  NA 136 134* 133*  K 3.5 3.9 3.8  CL 96* 94* 92*  CO2 31 28 30   GLUCOSE 118* 172* 180*  BUN 14 16 23*  CREATININE 0.89 0.85 1.07*  CALCIUM 9.0 9.5 10.2  MG 1.5* 1.6*  --    Liver Function Tests No results for input(s): AST, ALT, ALKPHOS, BILITOT, PROT, ALBUMIN in the last 72 hours. No results for input(s): LIPASE, AMYLASE in the last 72 hours. Cardiac Enzymes No results for input(s): CKTOTAL, CKMB, CKMBINDEX, TROPONINI in the last 72 hours.  BNP: BNP (last 3 results) Recent Labs    08/25/18 1020 06/17/19 1957  BNP 126.8* 414.8*    ProBNP (last 3 results) No results for input(s): PROBNP in the last 8760 hours.   D-Dimer No results for input(s): DDIMER in the last 72 hours. Hemoglobin A1C No results for input(s): HGBA1C in the last 72 hours. Fasting Lipid Panel No results for input(s): CHOL, HDL, LDLCALC, TRIG, CHOLHDL, LDLDIRECT in the last 72 hours. Thyroid Function Tests No results for input(s): TSH, T4TOTAL, T3FREE, THYROIDAB in the last 72 hours.  Invalid input(s): FREET3  Other results:   Imaging    No results found.   Medications:  Scheduled Medications: . [MAR Hold] atorvastatin  10 mg Oral Daily  . [MAR Hold] carvedilol  6.25 mg Oral BID WC  . [MAR Hold] dextromethorphan-guaiFENesin  1 tablet Oral BID  . [MAR Hold] digoxin  0.125 mg Oral Daily  . [MAR Hold] enoxaparin (LOVENOX) injection  0.5 mg/kg Subcutaneous Q24H  . [MAR Hold] furosemide  80 mg Intravenous BID  . [MAR Hold] insulin aspart  0-15 Units Subcutaneous TID WC  . [MAR Hold] insulin aspart  0-5 Units Subcutaneous QHS  . [MAR Hold] insulin glargine  4 Units Subcutaneous Daily  . [MAR Hold] ipratropium-albuterol  3 mL Nebulization Q6H  . [MAR Hold] ivabradine  5 mg Oral BID WC  . [MAR Hold] magnesium oxide  400 mg Oral BID  . [MAR Hold] potassium chloride  40 mEq Oral BID   . [MAR Hold] sacubitril-valsartan  1 tablet Oral BID  . [MAR Hold] sodium chloride flush  3 mL Intravenous Q12H  . [MAR Hold] sodium chloride flush  3 mL Intravenous Q12H  . [MAR Hold] spironolactone  25 mg Oral Daily    Infusions: . [MAR Hold] sodium chloride Stopped (06/20/19 1140)  . sodium chloride    . 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 Admits to highlevels of water and gatorade intake.   Assessment/Plan   1. Acute on Chronic Systolic HF: Echo this admission with EF < 20%, relatively normal RV, moderate MR. NICM with [employer] ICD. Acute exacerbation in the setting of dietary indiscretion w/ sodium. Pt believes she is ~20 lb above dry weight. Good diuresis so far, weight coming down.  BUN/creatinine a little higher. SBP 100s.  RHC today with low output (CI 1.8) and elevated PCWP but near normal RA pressure.  - Continue IV Lasix 80 mg bid today and will give a dose of metolazone 2.5 mg x 1, probably back to po tomorrow.   - With soft BP (no significant orthostatic symptoms), I decreased Coreg to 6.25 mg bid and added ivabradine 5 mg bid.  - Continue Entresto 97-103 - Continue spironolactone 25 daily.  - Continue digoxin 0.125 mg - Patient has low output HF, had low output on prior RHC as well.  CPX with probable severe functional impairment.  I think we are at the point where we need to think about LVAD.  Her size and social support will be issues.  She would not be a  transplant candidate at this point due to weight. Will begin evaluation.  2. OSA: Has been refusing CPAP here as she does not like the mask here.  3. Morbid obesity: Continue to work on weight loss.  4. Asthma: Wheezy on exam with history of asthma.  No PNA on CXR.  Possibly more due to pulmonary edema.  - Triad following, steroids stopped.  - Continue Duonebs.    Length of Stay: 4  Marca Ancona, MD  06/21/2019, 8:15 AM  Advanced Heart Failure Team Pager [phone] (M-F; 7a - 4p)  Please contact [facility] for night-coverage after  hours (4p -7a ) and weekends on [email or web address]  Discussed with Dr. Donata Clay.  Will start her on milrinone 0.25 mcg/kg/min with low output to try to improve her functional capacity at home.  Her only good option for advanced therapies at this point will be significant weight loss then LVAD.    She will need a tunneled catheter for the milrinone.   Marca Ancona 06/21/2019 5:10 PM

## 2019-06-21 NOTE — Plan of Care (Signed)

## 2019-06-21 NOTE — Consult Note (Signed)
PROGRESS NOTE    Kirsten Wheeler  VVO:[id number] DOB: 19-Jan-1981 DOA: 06/17/2019 PCP: Janine Limbo, PA-C   Brief Narrative:   Juniper Fountainis a 38 y.o.femalewith medical history significant of postpartum cardiomyopathy status post ICD, obstructive sleep apnea on CPAP, tobacco abuse, morbid obesity with BMI of 62, asthma presents to emergency department due to shortness of breath, leg swelling since 4 days. Reports association with orthopnea, PND, palpitation, cough, congestion however denies chest pain, fever, chills, runny nose, sore throat, recent COVID-19 exposure, headache, blurry vision, lightheadedness, dizziness, nausea, vomiting, abdominal pain, diarrhea, constipation, sleep or appetite changes.  10/2: Started on lasix 80mg  IV BID; feels like she is improving 10/3: C/o cough at night and racing heart; otherwise ok ON 10/4: Says she is breathing better today. Denies complaints ON. 10/5: In poor spirits this morning due to [facility] results. Reports dyspnea is better.    Assessment & Plan:   Principal Problem:   Acute on chronic systolic CHF (congestive heart failure) (HCC) Active Problems:   Morbid obesity- BMI 57   Peripartum cardiomyopathy   NICM (nonischemic cardiomyopathy) (HCC)   Tobacco abuse   Asthma   Hypokalemia   Obstructive sleep apnea   ICD (implantable cardioverter-defibrillator) in place   Diabetes mellitus ([place])   Acute on chronic CHF: - Due to severe nonischemic cardiomyopathy. Has ICD placed in 2018. - Patient presented with symptoms of fluid overload, chest x-ray reviewed which showed cardiomegaly-suspect mild CHF. - on IV Lasix 80 mg IV twice daily - echo from 7/19 which showed ejection fraction of 20 to 25%. - cards onboard; have agreed to take over service, appreciate their help; TRH will stay on a consult for medical issues (asthma, DM2) - LHC results noted     - per cards  Asthma: - CXR negative - continue  PRNduonebs - sats good on RA - she is a little wheezy at the bases today; will add short burst steroids and check repeat 2-view CXR     - CXR w/ vascular congestion     - 10/4: wheeze is "glassy"; question if this is more cardiac wheeze than not; will hold steroids today     - denies complaints, continue PRNs  Hypokalemia Hypomagnesemia: - K+3.5; continue current regimen -Mg2+ is low; will give IV Mg2+ - monitor     - 10/4: Mag is low again today; adding BID PO Mg2+; K+ ok, monitor     - 10/5: Continue Mg2+, K+ supplementation  Obstructive sleep apnea:  - On CPAP at home - We will continue CPAP while she is in the hospital. - didn't wear it last night because she didn't like the mask; encouraged to use regardless, can ask respiratory if there are other masks available     - 10/4: again refused CPAP last night; counseled on compliance     - 10/5: counseled on CPAP use  Morbid obesity:  - With BMI of 62. - Patient needs extensive counseling regarding dietary modification, exercise and weight loss.     - long term will need follow up with bariatric surgery but needs heart stabilization first  Tobacco abuse: - cessation encouraged  Type 2 diabetes mellitus: - A1c is 9.4 - SSI - DM ed consulted:  - start lantus 4 units qday; titrate as necessary     - 10/4: continue current lantus dose; glucose is acceptable     - 10/5: glucose acceptable this AM; continue at current dose; long term can still likely use PO meds; has tried  metformin in the past but did not tolerate (GI upset)  DVT prophylaxis: lovenox Code Status: FULL    ROS:  Denies cough, dyspnea, CP . Remainder 10-pt ROS is negative for all not previously mentioned.  Subjective: "I don't know that I'm doing well."  Objective: Vitals:   06/21/19 0917 06/21/19 1236 06/21/19 1344 06/21/19 1428  BP:  (!) 89/53  (!) 82/53  Pulse: 72 (!) 103 72   Resp: 18  14 20  (!) 26  Temp:  (!) 97.5 F (36.4 C)    TempSrc:  Oral    SpO2:  99%    Weight:      Height:        Intake/Output Summary (Last 24 hours) at 06/21/2019 1602 Last data filed at 06/21/2019 1236 Gross per 24 hour  Intake 1077 ml  Output 3800 ml  Net -2723 ml   Filed Weights   06/19/19 0238 06/20/19 0321 06/21/19 0010  Weight: (!) 170.7 kg (!) 168.5 kg (!) 165.6 kg    Examination:  General:38 y.o.femaleresting in bed in NAD Eyes: PERRL, normal sclera ENMT: Nares patent w/o discharge, orophaynx clear, dentition normal, ears w/o discharge/lesions/ulcers Cardiovascular: RRR, +S1, S2, no m/g/r, equal pulses throughout Respiratory: slight exp wheeze noted at bases, normal WOB GI: BS+,obese,NDNT, no masses noted, no organomegaly noted MSK: Noc/c; BLE edema Neuro: Alert to name, follows commands   Data Reviewed: I have personally reviewed following labs and imaging studies.  CBC: Recent Labs  Lab 06/17/19 1347 06/19/19 0219 06/21/19 0759  WBC 9.3 9.9  --   NEUTROABS  --  6.3  --   HGB 12.6 12.5 14.6  HCT 36.0 36.4 43.0  MCV 80.2 80.7  --   PLT 247 276  --    Basic Metabolic Panel: Recent Labs  Lab 06/17/19 1347 06/17/19 1830 06/17/19 1957 06/18/19 1230 06/19/19 0219 06/20/19 0316 06/21/19 0227 06/21/19 0759  NA 136  --   --  136 136 134* 133* 138  K 3.3*  --   --  3.1* 3.5 3.9 3.8 3.5  CL 97*  --   --  98 96* 94* 92*  --   CO2 28  --   --  28 31 28 30   --   GLUCOSE 231*  --   --  183* 118* 172* 180*  --   BUN 15  --   --  13 14 16  23*  --   CREATININE 1.04*  --   --  0.93 0.89 0.85 1.07*  --   CALCIUM 8.3*  --   --  8.6* 9.0 9.5 10.2  --   MG  --  1.4* 1.4*  --  1.5* 1.6*  --   --    GFR: Estimated Creatinine Clearance: 115.2 mL/min (A) (by C-G formula based on SCr of 1.07 mg/dL (H)). Liver Function Tests: No results for input(s): AST, ALT, ALKPHOS, BILITOT, PROT, ALBUMIN in the last 168 hours. No results for input(s): LIPASE, AMYLASE in the last  168 hours. No results for input(s): AMMONIA in the last 168 hours. Coagulation Profile: No results for input(s): INR, PROTIME in the last 168 hours. Cardiac Enzymes: No results for input(s): CKTOTAL, CKMB, CKMBINDEX, TROPONINI in the last 168 hours. BNP (last 3 results) No results for input(s): PROBNP in the last 8760 hours. HbA1C: No results for input(s): HGBA1C in the last 72 hours. CBG: Recent Labs  Lab 06/20/19 1649 06/20/19 2034 06/21/19 0008 06/21/19 0408 06/21/19 1044  GLUCAP 169* 144* 216* 146*  176*   Lipid Profile: No results for input(s): CHOL, HDL, LDLCALC, TRIG, CHOLHDL, LDLDIRECT in the last 72 hours. Thyroid Function Tests: No results for input(s): TSH, T4TOTAL, FREET4, T3FREE, THYROIDAB in the last 72 hours. Anemia Panel: No results for input(s): VITAMINB12, FOLATE, FERRITIN, TIBC, IRON, RETICCTPCT in the last 72 hours. Sepsis Labs: No results for input(s): PROCALCITON, LATICACIDVEN in the last 168 hours.  Recent Results (from the past 240 hour(s))  SARS CORONAVIRUS 2 (TAT 6-24 HRS) Nasopharyngeal Nasopharyngeal Swab     Status: None   Collection Time: 06/17/19  5:18 PM   Specimen: Nasopharyngeal Swab  Result Value Ref Range Status   SARS Coronavirus 2 NEGATIVE NEGATIVE Final    Comment: (NOTE) SARS-CoV-2 target nucleic acids are NOT DETECTED. The SARS-CoV-2 RNA is generally detectable in upper and lower respiratory specimens during the acute phase of infection. Negative results do not preclude SARS-CoV-2 infection, do not rule out co-infections with other pathogens, and should not be used as the sole basis for treatment or other patient management decisions. Negative results must be combined with clinical observations, patient history, and epidemiological information. The expected result is Negative. Fact Sheet for Patients: HairSlick.nohttps://www.fda.gov/media/138098/download Fact Sheet for Healthcare Providers: quierodirigir.comhttps://www.fda.gov/media/138095/download This  test is not yet approved or cleared by the [place] FDA and  has been authorized for detection and/or diagnosis of SARS-CoV-2 by FDA under an Emergency Use Authorization (EUA). This EUA will remain  in effect (meaning this test can be used) for the duration of the COVID-19 declaration under Section 56 4(b)(1) of the Act, 21 U.S.C. section 360bbb-3(b)(1), unless the authorization is terminated or revoked sooner. Performed at [facility], [address] [place], [place] [address]   MRSA PCR Screening     Status: None   Collection Time: 06/18/19 12:59 PM   Specimen: Nasopharyngeal  Result Value Ref Range Status   MRSA by PCR NEGATIVE NEGATIVE Final    Comment:        The GeneXpert MRSA Assay (FDA approved for NASAL specimens only), is one component of a comprehensive MRSA colonization surveillance program. It is not intended to diagnose MRSA infection nor to guide or monitor treatment for MRSA infections. Performed at [facility], [address] [place], [place] [address]       Radiology Studies: No results found.   Scheduled Meds: . atorvastatin  10 mg Oral Daily  . carvedilol  3.125 mg Oral BID WC  . dextromethorphan-guaiFENesin  1 tablet Oral BID  . digoxin  0.125 mg Oral Daily  . enoxaparin (LOVENOX) injection  0.5 mg/kg Subcutaneous Q24H  . furosemide  80 mg Intravenous BID  . insulin aspart  0-15 Units Subcutaneous TID WC  . insulin aspart  0-5 Units Subcutaneous QHS  . insulin glargine  4 Units Subcutaneous Daily  . ipratropium-albuterol  3 mL Nebulization Q6H  . ivabradine  5 mg Oral BID WC  . magnesium oxide  400 mg Oral BID  . potassium chloride  40 mEq Oral BID  . sacubitril-valsartan  1 tablet Oral BID  . sodium chloride flush  3 mL Intravenous Q12H  . sodium chloride flush  3 mL Intravenous Q12H  . spironolactone  25 mg Oral Daily   Continuous Infusions: . sodium chloride Stopped (06/20/19 1140)     LOS: 4 days    Time spent:  25 minutes spent in the coordination of care today.    Teddy Spikeyrone A Zacari Radick, DO [facility] Pager [phone]  If 7PM-7AM,  please contact night-coverage [email or web address] Password [email or web address] 06/21/2019, 4:02 PM

## 2019-06-21 NOTE — Consult Note (Signed)
[address]       [place] [address]             [phone]        [facility] Medical Record #[id number] Date of Birth: [birth year]  Referring: No ref. provider found Primary Care: Eunice Blase, PA-C Primary Cardiologist:No primary care provider on file.  Chief Complaint:    Chief Complaint  Patient presents with  . Shortness of Breath  Patient examined, images of most recent CT scan of chest, echocardiogram, and right heart cath data obtained today personally reviewed.  History of Present Illness:     38 year old AA female with postpartum cardiomyopathy and progressive heart failure over the past several years.  Last hospitalized 2019 for heart failure.  Hospitalized again 3 days ago for shortness of breath, fluid overload, with EF less than 20%.  Patient is super morbidly obese with BMI of 60.  She is on maximal medical therapy.  On presentation her wedge pressure was 27 with cardiac index 1.8.  PA saturation only 57%.  Patient has had an AICD placed but has not been shocked.  She is in sinus rhythm.\  Echocardiogram shows mild-moderate RV dysfunction, moderate mitral regurgitation, no TR or AI, no known coronary disease with LVEF less than 20%.  She has had previous general anesthesia for C-section and AICD placement.  Patient has sleep apnea, history of asthma, on inhalers, and history of smoking.  She currently only smokes marijuana once a week.  She has not had PFTs or room air blood gas.  The patient is able to walk around her hospital room but is short of breath with normal ambulation.  Current Activity/ Functional Status: Patient lives with her 36 year old and 76 year old daughters.  Closest family [place] out of state.   Zubrod Score: At the time of surgery this patient's most appropriate activity status/level should be described as: []     0    Normal activity, no symptoms []     1    Restricted in physical strenuous activity but  ambulatory, able to do out light work []     2    Ambulatory and capable of self care, unable to do work activities, up and about                 more than 50%  Of the time                            [x]     3    Only limited self care, in bed greater than 50% of waking hours []     4    Completely disabled, no self care, confined to bed or chair []     5    Moribund  Past Medical History:  Diagnosis Date  . Asthma   . Chronic systolic CHF (congestive heart failure) (HCC)    a. ECHO 01/15/2016 EF 10-15%. 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  . Diabetes mellitus (HCC) 06/17/2019  . Hearing loss    bilateral  . Mitral regurgitation    a. severe, felt to be functional 2/2 LV dilation   . Morbid obesity (HCC)   . Snoring     Past Surgical History:  Procedure Laterality Date  . CARDIAC CATHETERIZATION N/A 01/18/2016   Procedure: Right/Left Heart Cath and Coronary Angiography;  Surgeon: Laurey Morale, MD;  Location: [facility] INVASIVE CV LAB;  Service: Cardiovascular;  Laterality: N/A;  . CESAREAN SECTION    . ICD IMPLANT N/A 04/17/2017   Procedure: ICD Implant;  Surgeon: Constance Haw, MD;  Location: [place] CV LAB;  Service: Cardiovascular;  Laterality: N/A;  . LEAD REVISION/REPAIR N/A 04/21/2017   Procedure: Lead Revision/Repair;  Surgeon: Constance Haw, MD;  Location: [place] CV LAB;  Service: Cardiovascular;  Laterality: N/A;  . RIGHT HEART CATH N/A 06/21/2019   Procedure: RIGHT HEART CATH;  Surgeon: Larey Dresser, MD;  Location: [place] CV LAB;  Service: Cardiovascular;  Laterality: N/A;  . TONSILLECTOMY      Social History   Tobacco Use  Smoking Status Former Smoker  . Quit date: 10/19/2015  . Years since quitting: 3.6  Smokeless Tobacco Never Used    Social History   Substance and Sexual Activity  Alcohol Use Yes  . Alcohol/week: 0.0 standard drinks   Comment: occasionally     Allergies  Allergen Reactions  . Metformin And Related Diarrhea    And  caused severe "dry mouth," also    Current Facility-Administered Medications  Medication Dose Route Frequency Provider Last Rate Last Dose  . 0.9 %  sodium chloride infusion  250 mL Intravenous PRN Pahwani, Michell Heinrich, MD   Stopped at 06/20/19 1140  . acetaminophen (TYLENOL) tablet 650 mg  650 mg Oral Q4H PRN Pahwani, Rinka R, MD      . atorvastatin (LIPITOR) tablet 10 mg  10 mg Oral Daily Pahwani, Rinka R, MD   10 mg at 06/21/19 0941  . benzonatate (TESSALON) capsule 100 mg  100 mg Oral BID PRN Marylyn Ishihara, Tyrone A, DO   100 mg at 06/20/19 0913  . carvedilol (COREG) tablet 6.25 mg  6.25 mg Oral BID WC Larey Dresser, MD   6.25 mg at 06/21/19 0941  . dextromethorphan-guaiFENesin (MUCINEX DM) 30-600 MG per 12 hr tablet 1 tablet  1 tablet Oral BID Marylyn Ishihara, Tyrone A, DO   1 tablet at 06/21/19 0941  . digoxin (LANOXIN) tablet 0.125 mg  0.125 mg Oral Daily Pahwani, Rinka R, MD   0.125 mg at 06/21/19 0941  . enoxaparin (LOVENOX) injection 85 mg  0.5 mg/kg Subcutaneous Q24H Jennette Kettle M, DO   85 mg at 06/21/19 0944  . furosemide (LASIX) injection 80 mg  80 mg Intravenous BID Larey Dresser, MD   80 mg at 06/21/19 0946  . guaiFENesin (ROBITUSSIN) 100 MG/5ML solution 100 mg  5 mL Oral Q4H PRN Larey Dresser, MD   100 mg at 06/19/19 0640  . insulin aspart (novoLOG) injection 0-15 Units  0-15 Units Subcutaneous TID WC Pahwani, Rinka R, MD   3 Units at 06/21/19 1206  . insulin aspart (novoLOG) injection 0-5 Units  0-5 Units Subcutaneous QHS Pahwani, Rinka R, MD   2 Units at 06/19/19 2155  . insulin glargine (LANTUS) injection 4 Units  4 Units Subcutaneous Daily Cherylann Ratel A, DO   4 Units at 06/21/19 0944  . ipratropium-albuterol (DUONEB) 0.5-2.5 (3) MG/3ML nebulizer solution 3 mL  3 mL Nebulization Q4H PRN Pahwani, Rinka R, MD   3 mL at 06/21/19 0916  . ipratropium-albuterol (DUONEB) 0.5-2.5 (3) MG/3ML nebulizer solution 3 mL  3 mL Nebulization Q6H Larey Dresser, MD   3 mL at 06/21/19 0308  . ivabradine  (CORLANOR) tablet 5 mg  5 mg Oral BID WC Larey Dresser, MD   5 mg at 06/21/19 0941  . magnesium oxide (MAG-OX) tablet 400 mg  400 mg  Oral BID Ronaldo Miyamoto, Tyrone A, DO   400 mg at 06/21/19 0940  . ondansetron (ZOFRAN) injection 4 mg  4 mg Intravenous Q6H PRN Pahwani, Rinka R, MD      . potassium chloride SA (KLOR-CON) CR tablet 40 mEq  40 mEq Oral BID Berton Bon, NP   40 mEq at 06/21/19 0941  . sacubitril-valsartan (ENTRESTO) 97-103 mg per tablet  1 tablet Oral BID Pahwani, Rinka R, MD   1 tablet at 06/21/19 0941  . sodium chloride flush (NS) 0.9 % injection 3 mL  3 mL Intravenous Q12H Pahwani, Rinka R, MD   3 mL at 06/21/19 0947  . sodium chloride flush (NS) 0.9 % injection 3 mL  3 mL Intravenous PRN Pahwani, Rinka R, MD      . sodium chloride flush (NS) 0.9 % injection 3 mL  3 mL Intravenous Q12H Laurey Morale, MD   3 mL at 06/21/19 0946  . spironolactone (ALDACTONE) tablet 25 mg  25 mg Oral Daily Pahwani, Rinka R, MD   25 mg at 06/21/19 0941    Medications Prior to Admission  Medication Sig Dispense Refill Last Dose  . albuterol (PROAIR HFA) 108 (90 Base) MCG/ACT inhaler Inhale 2 puffs into the lungs every 6 (six) hours as needed for wheezing or shortness of breath.   unk at [employer]  . aspirin-acetaminophen-caffeine (PAMPRIN MAX) 250-250-65 MG tablet Take 1 tablet by mouth every 6 (six) hours as needed (for pain).   Past Week at Unknown time  . atorvastatin (LIPITOR) 10 MG tablet Take 10 mg by mouth daily.    06/16/2019 at Unknown time  . carvedilol (COREG) 12.5 MG tablet TAKE 1 TABLET(12.5 MG) BY MOUTH TWICE DAILY WITH A MEAL (Patient taking differently: Take 12.5 mg by mouth 2 (two) times daily with a meal. ) 180 tablet 1 06/16/2019 at 2000  . digoxin (LANOXIN) 0.125 MG tablet TAKE 1 TABLET(0.125 MG) BY MOUTH DAILY (Patient taking differently: Take 0.125 mg by mouth daily. ) 90 tablet 3 06/16/2019 at Unknown time  . ipratropium-albuterol (DUONEB) 0.5-2.5 (3) MG/3ML SOLN Take 3 mLs by nebulization  every 4 (four) hours as needed. (Patient taking differently: Take 3 mLs by nebulization every 4 (four) hours as needed (for shortness of breath or wheezing). ) 360 mL 0 SEE NOTE at SEE NOTE  . potassium chloride SA (K-DUR) 20 MEQ tablet Take 2 tablets (40 mEq total) by mouth 2 (two) times daily. 120 tablet 6 06/16/2019 at pm  . PRESCRIPTION MEDICATION CPAP- AT BEDTIME   06/16/2019 at pm  . sacubitril-valsartan (ENTRESTO) 97-103 MG Take 1 tablet by mouth 2 (two) times daily. 60 tablet 11 06/16/2019 at pm  . spironolactone (ALDACTONE) 25 MG tablet Take 1 tablet (25 mg total) by mouth daily. 90 tablet 3 06/16/2019 at Unknown time  . torsemide (DEMADEX) 20 MG tablet TAKE 4 TABLETS BY MOUTH EVERY MORNING AND 3 TABLETS EVERY EVENING (Patient taking differently: Take 60-80 mg by mouth See admin instructions. Take 80 mg by mouth in the morning and 60 mg in the evening) 420 tablet 0 06/16/2019 at pm  . carvedilol (COREG) 25 MG tablet Take 1 tablet (25 mg total) by mouth 2 (two) times daily with a meal. (Patient not taking: Reported on 06/17/2019) 60 tablet 5 Not Taking at Unknown time    Family History  Problem Relation Age of Onset  . Heart attack Mother   . Hyperlipidemia Father   . Diabetes Brother      Review  of Systems:   ROS Right-hand-dominant No history of thoracic trauma No history of bleeding but has not been on anticoagulation    Cardiac Review of Systems: Y or  [    ]= no  Chest Pain [    ]  Resting SOB [ y  ] Exertional SOB  [ y ]  Orthopnea [ y ]   Pedal Edema [   ]    Palpitations [  ] Syncope  [  ]   Presyncope [   ]  General Review of Systems: [Y] = yes [  ]=no Constitional: recent weight change Cove.Etienne[y  ]; anorexia [  ]; fatigue Cove.Etienne[y  ]; nausea [  ]; night sweats [  ]; fever [  ]; or chills [  ]                                                               Dental: Last Dentist visit: Unknown  Eye : blurred vision [  ]; diplopia [   ]; vision changes [  ];  Amaurosis fugax[  ]; Resp: cough  [  ];  wheezing[y  ];  hemoptysis[  ]; shortness of breath[  y]; paroxysmal nocturnal dyspnea[  ]; dyspnea on exertion[  ]; or orthopnea[  ];  GI:  gallstones[  ], vomiting[  ];  dysphagia[  ]; melena[  ];  hematochezia [  ]; heartburn[  ];   Hx of  Colonoscopy[  ]; GU: kidney stones [  ]; hematuria[  ];   dysuria [  ];  nocturia[  ];  history of     obstruction [  ]; urinary frequency [  ]             Skin: rash, swelling[  ];, hair loss[  ];  peripheral edema[  ];  or itching[  ]; Musculosketetal: myalgias[  ];  joint swelling[  ];  joint erythema[  ];  joint pain[  ];  back pain[  ];  Heme/Lymph: bruising[  ];  bleeding[  ];  anemia[  ];  Neuro: TIA[  ];  headaches[  ];  stroke[  ];  vertigo[  ];  seizures[  ];   paresthesias[  ];  difficulty walking[  ];  Psych:depression[  ]; anxiety[  ];  Endocrine: diabetes[y  ];  thyroid dysfunction[  ];            Physical Exam: BP (!) 91/56 (BP Location: Left Wrist)   Pulse 72   Temp 97.9 F (36.6 C) (Oral)   Resp 18   Ht 5\' 5"  (1.651 m)   Wt (!) 165.6 kg   SpO2 98%   BMI 60.75 kg/m        Exam    General- alert and comfortable, morbidly obese middle-aged female.    Neck- no JVD, no cervical adenopathy palpable, no carotid bruit   Lungs-scattered  rales, wheezes   Cor- regular rate and rhythm, no murmur , gallop   Abdomen- soft, non-tender   Extremities - warm, non-tender, minimal edema   Neuro- oriented, appropriate, no focal weakness    Diagnostic Studies & Laboratory data:     Recent Radiology Findings:   No results found.   I have independently reviewed the above radiologic studies and discussed  with the patient   Recent Lab Findings: Lab Results  Component Value Date   WBC 9.9 06/19/2019   HGB 12.5 06/19/2019   HCT 36.4 06/19/2019   PLT 276 06/19/2019   GLUCOSE 180 (H) 06/21/2019   CHOL 89 06/17/2019   TRIG 121 06/17/2019   HDL 24 (L) 06/17/2019   LDLCALC 41 06/17/2019   ALT 15 04/11/2017   AST 23 04/11/2017    NA 133 (L) 06/21/2019   K 3.8 06/21/2019   CL 92 (L) 06/21/2019   CREATININE 1.07 (H) 06/21/2019   BUN 23 (H) 06/21/2019   CO2 30 06/21/2019   TSH 2.805 06/17/2019   INR 1.14 01/17/2016   HGBA1C 9.4 (H) 06/17/2019      Assessment / Plan:   Postmortem cardiomyopathy Acute on chronic systolic heart failure with EF less than 20% Low cardiac output and class IV symptoms of heart failure despite maximal medical therapy Chronic lung disease with sleep apnea and asthmatic bronchitis  Severe morbid obesity with BMI 60- sternotomy is very problematic because patient would probably be ventilator dependent for extended period of time with subsequent exposure to other postoperative complications.  Significant weight loss would be needed before she would be an acceptable candidate.  Patient needs PFTs room air blood gas and repeat CT scan of chest-last study was over a year ago.     @ME1 @ 06/21/2019 12:11 PM

## 2019-06-22 LAB — CBC
HCT: 42.5 % (ref 36.0–46.0)
Hemoglobin: 14.7 g/dL (ref 12.0–15.0)
MCH: 27.4 pg (ref 26.0–34.0)
MCHC: 34.6 g/dL (ref 30.0–36.0)
MCV: 79.1 fL — ABNORMAL LOW (ref 80.0–100.0)
Platelets: 337 10*3/uL (ref 150–400)
RBC: 5.37 MIL/uL — ABNORMAL HIGH (ref 3.87–5.11)
RDW: 13.2 % (ref 11.5–15.5)
WBC: 9.2 10*3/uL (ref 4.0–10.5)
nRBC: 0 % (ref 0.0–0.2)

## 2019-06-22 LAB — COMPREHENSIVE METABOLIC PANEL
ALT: 28 U/L (ref 0–44)
AST: 29 U/L (ref 15–41)
Albumin: 3.3 g/dL — ABNORMAL LOW (ref 3.5–5.0)
Alkaline Phosphatase: 50 U/L (ref 38–126)
Anion gap: 14 (ref 5–15)
BUN: 27 mg/dL — ABNORMAL HIGH (ref 6–20)
CO2: 28 mmol/L (ref 22–32)
Calcium: 10.1 mg/dL (ref 8.9–10.3)
Chloride: 92 mmol/L — ABNORMAL LOW (ref 98–111)
Creatinine, Ser: 1.22 mg/dL — ABNORMAL HIGH (ref 0.44–1.00)
GFR calc Af Amer: >60 mL/min (ref >60)
GFR calc non Af Amer: 57 mL/min — ABNORMAL LOW (ref >60)
Glucose, Bld: 192 mg/dL — ABNORMAL HIGH (ref 70–99)
Potassium: 3.6 mmol/L (ref 3.5–5.1)
Sodium: 134 mmol/L — ABNORMAL LOW (ref 135–145)
Total Bilirubin: 1.1 mg/dL (ref 0.3–1.2)
Total Protein: 7.6 g/dL (ref 6.5–8.1)

## 2019-06-22 LAB — BASIC METABOLIC PANEL WITH GFR
Anion gap: 15 (ref 5–15)
BUN: 31 mg/dL — ABNORMAL HIGH (ref 6–20)
CO2: 27 mmol/L (ref 22–32)
Calcium: 10.2 mg/dL (ref 8.9–10.3)
Chloride: 89 mmol/L — ABNORMAL LOW (ref 98–111)
Creatinine, Ser: 1.58 mg/dL — ABNORMAL HIGH (ref 0.44–1.00)
GFR calc Af Amer: 48 mL/min — ABNORMAL LOW
GFR calc non Af Amer: 42 mL/min — ABNORMAL LOW
Glucose, Bld: 262 mg/dL — ABNORMAL HIGH (ref 70–99)
Potassium: 3.6 mmol/L (ref 3.5–5.1)
Sodium: 131 mmol/L — ABNORMAL LOW (ref 135–145)

## 2019-06-22 LAB — URIC ACID: Uric Acid, Serum: 11.3 mg/dL — ABNORMAL HIGH (ref 2.5–7.1)

## 2019-06-22 LAB — ABO/RH: ABO/RH(D): O POS

## 2019-06-22 LAB — GLUCOSE, CAPILLARY
Glucose-Capillary: 193 mg/dL — ABNORMAL HIGH (ref 70–99)
Glucose-Capillary: 217 mg/dL — ABNORMAL HIGH (ref 70–99)
Glucose-Capillary: 179 mg/dL — ABNORMAL HIGH (ref 70–99)
Glucose-Capillary: 184 mg/dL — ABNORMAL HIGH (ref 70–99)

## 2019-06-22 LAB — PREALBUMIN: Prealbumin: 23 mg/dL (ref 18–38)

## 2019-06-22 LAB — HEPATITIS B SURFACE ANTIGEN: Hepatitis B Surface Ag: NONREACTIVE

## 2019-06-22 LAB — TYPE AND SCREEN
ABO/RH(D): O POS
Antibody Screen: NEGATIVE

## 2019-06-22 LAB — PROTIME-INR
INR: 1.1 (ref 0.8–1.2)
Prothrombin Time: 14.3 seconds (ref 11.4–15.2)

## 2019-06-22 LAB — HEPATITIS B CORE ANTIBODY, IGM: Hep B C IgM: NONREACTIVE

## 2019-06-22 LAB — APTT: aPTT: 30 seconds (ref 24–36)

## 2019-06-22 LAB — LACTATE DEHYDROGENASE: LDH: 162 U/L (ref 98–192)

## 2019-06-22 LAB — MAGNESIUM: Magnesium: 1.5 mg/dL — ABNORMAL LOW (ref 1.7–2.4)

## 2019-06-22 LAB — ANTITHROMBIN III: AntiThromb III Func: 107 % (ref 75–120)

## 2019-06-22 LAB — HEPATITIS C ANTIBODY: HCV Ab: NONREACTIVE

## 2019-06-22 MED ORDER — MAGNESIUM SULFATE 4 GM/100ML IV SOLN
4.0000 g | Freq: Once | INTRAVENOUS | Status: AC
  Administered 2019-06-22: 4 g via INTRAVENOUS
  Filled 2019-06-22: qty 100

## 2019-06-22 MED ORDER — SACUBITRIL-VALSARTAN 49-51 MG PO TABS
1.0000 | ORAL_TABLET | Freq: Two times a day (BID) | ORAL | Status: DC
  Administered 2019-06-22 (×2): 1 via ORAL
  Filled 2019-06-22 (×3): qty 1

## 2019-06-22 MED ORDER — INSULIN GLARGINE 100 UNIT/ML ~~LOC~~ SOLN
5.0000 [IU] | Freq: Every day | SUBCUTANEOUS | Status: DC
  Administered 2019-06-23 – 2019-06-25 (×3): 5 [IU] via SUBCUTANEOUS
  Filled 2019-06-22 (×3): qty 0.05

## 2019-06-22 NOTE — Progress Notes (Signed)
Marland Kitchen  PROGRESS NOTE    Kirsten Wheeler  NIO:[id number] DOB: 03-Apr-1981 DOA: 06/17/2019 PCP: Janine Limbo, PA-C   Brief Narrative:   Kirsten Fountainis a 38 y.o.femalewith medical history significant of postpartum cardiomyopathy status post ICD, obstructive sleep apnea on CPAP, tobacco abuse, morbid obesity with BMI of 62, asthma presents to emergency department due to shortness of breath, leg swelling since 4 days. Reports association with orthopnea, PND, palpitation, cough, congestion however denies chest pain, fever, chills, runny nose, sore throat, recent COVID-19 exposure, headache, blurry vision, lightheadedness, dizziness, nausea, vomiting, abdominal pain, diarrhea, constipation, sleep or appetite changes.  10/6: Hypotensive yesterday. Otherwise, no acute events ON   Assessment & Plan:   Principal Problem:   Acute on chronic systolic CHF (congestive heart failure) (HCC) Active Problems:   Morbid obesity- BMI 57   Peripartum cardiomyopathy   NICM (nonischemic cardiomyopathy) (HCC)   Tobacco abuse   Asthma   Hypokalemia   Obstructive sleep apnea   ICD (implantable cardioverter-defibrillator) in place   Diabetes mellitus ([place])  Acute on chronic CHF: - Due to severe nonischemic cardiomyopathy. Has ICD placed in 2018. - Patient presented with symptoms of fluid overload, chest x-ray reviewed which showed cardiomegaly-suspect mild CHF. - on IV Lasix 80 mg IV twice daily - echo from 7/19 which showed ejection fraction of 20 to 25%. - cards onboard; have agreed to take over service, appreciate their help; TRH will stay on a consult for medical issues (asthma, DM2) - LHC results noted     - per cards  Asthma: - CXR negative - continue PRNduonebs - sats good on RA - she is a little wheezy at the bases today; will add short burst steroids and check repeat 2-view CXR - CXR w/ vascular congestion - 10/6: no wheeze  noted  Hypokalemia Hypomagnesemia: - K+3.5; continue current regimen -Mg2+ is low; will give IV Mg2+ - monitor - 10/4: Mag is low again today; adding BID PO Mg2+; K+ ok, monitor     - 10/5: Continue Mg2+, K+ supplementation     - 10/6: Getting Mg2+ 4mg  IV; continue K+ BID  Obstructive sleep apnea:  - On CPAP at home - We will continue CPAP while she is in the hospital. - didn't wear it last night because she didn't like the mask; encouraged to use regardless, can ask respiratory if there are other masks available - refusing to keep it on  Morbid obesity:  - With BMI of 62. - Patient needs extensive counseling regarding dietary modification, exercise and weight loss.     - long term will need follow up with bariatric surgery but needs heart stabilization first  Tobacco abuse: - cessation encouraged  Type 2 diabetes mellitus: - A1c is 9.4 - SSI - DM ed consulted:  - long term, she can still likely use PO meds; has tried metformin in the past but did not tolerate (GI upset)     - lantus 5 units qday  DVT prophylaxis: lovenox Code Status: FULL    Subjective: No acute events ON per nursing  Objective: Vitals:   06/22/19 0700 06/22/19 0821 06/22/19 0843 06/22/19 1133  BP:   (!) 102/51 90/76  Pulse:   93 80  Resp: (!) 23 14 19 17   Temp:   97.7 F (36.5 C) (!) 97.4 F (36.3 C)  TempSrc:   Oral Oral  SpO2:  99% 91% 95%  Weight:      Height:        Intake/Output Summary (Last  24 hours) at 06/22/2019 1306 Last data filed at 06/22/2019 1226 Gross per 24 hour  Intake 1141.16 ml  Output 1900 ml  Net -758.84 ml   Filed Weights   06/20/19 0321 06/21/19 0010 06/22/19 0347  Weight: (!) 168.5 kg (!) 165.6 kg (!) 163.4 kg    Examination:  General:38 y.o.femaleresting in bed in NAD Eyes: PERRL, normal sclera ENMT: Nares patent w/o discharge, orophaynx clear, dentition normal, ears w/o  discharge/lesions/ulcers Cardiovascular: RRR, +S1, S2, no m/g/r, equal pulses throughout Respiratory:CTABL, normal WOB GI: BS+,obese,NDNT, no masses noted, no organomegaly noted MSK: Noc/c; BLE edema Neuro: Alert to name, follows commands   Data Reviewed: I have personally reviewed following labs and imaging studies.  CBC: Recent Labs  Lab 06/17/19 1347 06/19/19 0219 06/21/19 0759 06/22/19 0530  WBC 9.3 9.9  --  9.2  NEUTROABS  --  6.3  --   --   HGB 12.6 12.5 14.6  14.6 14.7  HCT 36.0 36.4 43.0  43.0 42.5  MCV 80.2 80.7  --  79.1*  PLT 247 276  --  337   Basic Metabolic Panel: Recent Labs  Lab 06/17/19 1830 06/17/19 1957 06/18/19 1230 06/19/19 0219 06/20/19 0316 06/21/19 0227 06/21/19 0759 06/22/19 0530  NA  --   --  136 136 134* 133* 139  138 134*  K  --   --  3.1* 3.5 3.9 3.8 3.4*  3.5 3.6  CL  --   --  98 96* 94* 92*  --  92*  CO2  --   --  28 31 28 30   --  28  GLUCOSE  --   --  183* 118* 172* 180*  --  192*  BUN  --   --  13 14 16  23*  --  27*  CREATININE  --   --  0.93 0.89 0.85 1.07*  --  1.22*  CALCIUM  --   --  8.6* 9.0 9.5 10.2  --  10.1  MG 1.4* 1.4*  --  1.5* 1.6*  --   --  1.5*   GFR: Estimated Creatinine Clearance: 100.2 mL/min (A) (by C-G formula based on SCr of 1.22 mg/dL (H)). Liver Function Tests: Recent Labs  Lab 06/22/19 0530  AST 29  ALT 28  ALKPHOS 50  BILITOT 1.1  PROT 7.6  ALBUMIN 3.3*   No results for input(s): LIPASE, AMYLASE in the last 168 hours. No results for input(s): AMMONIA in the last 168 hours. Coagulation Profile: Recent Labs  Lab 06/22/19 0530  INR 1.1   Cardiac Enzymes: No results for input(s): CKTOTAL, CKMB, CKMBINDEX, TROPONINI in the last 168 hours. BNP (last 3 results) No results for input(s): PROBNP in the last 8760 hours. HbA1C: No results for input(s): HGBA1C in the last 72 hours. CBG: Recent Labs  Lab 06/21/19 1044 06/21/19 1606 06/21/19 2142 06/22/19 0621 06/22/19 1131  GLUCAP 176*  189* 179* 193* 217*   Lipid Profile: No results for input(s): CHOL, HDL, LDLCALC, TRIG, CHOLHDL, LDLDIRECT in the last 72 hours. Thyroid Function Tests: No results for input(s): TSH, T4TOTAL, FREET4, T3FREE, THYROIDAB in the last 72 hours. Anemia Panel: No results for input(s): VITAMINB12, FOLATE, FERRITIN, TIBC, IRON, RETICCTPCT in the last 72 hours. Sepsis Labs: No results for input(s): PROCALCITON, LATICACIDVEN in the last 168 hours.  Recent Results (from the past 240 hour(s))  SARS CORONAVIRUS 2 (TAT 6-24 HRS) Nasopharyngeal Nasopharyngeal Swab     Status: None   Collection Time: 06/17/19  5:18 PM  Specimen: Nasopharyngeal Swab  Result Value Ref Range Status   SARS Coronavirus 2 NEGATIVE NEGATIVE Final    Comment: (NOTE) SARS-CoV-2 target nucleic acids are NOT DETECTED. The SARS-CoV-2 RNA is generally detectable in upper and lower respiratory specimens during the acute phase of infection. Negative results do not preclude SARS-CoV-2 infection, do not rule out co-infections with other pathogens, and should not be used as the sole basis for treatment or other patient management decisions. Negative results must be combined with clinical observations, patient history, and epidemiological information. The expected result is Negative. Fact Sheet for Patients: HairSlick.no Fact Sheet for Healthcare Providers: quierodirigir.com This test is not yet approved or cleared by the [place] FDA and  has been authorized for detection and/or diagnosis of SARS-CoV-2 by FDA under an Emergency Use Authorization (EUA). This EUA will remain  in effect (meaning this test can be used) for the duration of the COVID-19 declaration under Section 56 4(b)(1) of the Act, 21 U.S.C. section 360bbb-3(b)(1), unless the authorization is terminated or revoked sooner. Performed at [facility], [address], [place], [place] [address]   MRSA  PCR Screening     Status: None   Collection Time: 06/18/19 12:59 PM   Specimen: Nasopharyngeal  Result Value Ref Range Status   MRSA by PCR NEGATIVE NEGATIVE Final    Comment:        The GeneXpert MRSA Assay (FDA approved for NASAL specimens only), is one component of a comprehensive MRSA colonization surveillance program. It is not intended to diagnose MRSA infection nor to guide or monitor treatment for MRSA infections. Performed at [facility], [address], [place], [place] [address]       Radiology Studies: No results found.   Scheduled Meds: . atorvastatin  10 mg Oral Daily  . carvedilol  3.125 mg Oral BID WC  . dextromethorphan-guaiFENesin  1 tablet Oral BID  . digoxin  0.125 mg Oral Daily  . enoxaparin (LOVENOX) injection  0.5 mg/kg Subcutaneous Q24H  . furosemide  80 mg Intravenous BID  . insulin aspart  0-15 Units Subcutaneous TID WC  . insulin aspart  0-5 Units Subcutaneous QHS  . insulin glargine  4 Units Subcutaneous Daily  . ipratropium-albuterol  3 mL Nebulization Q6H  . ivabradine  5 mg Oral BID WC  . magnesium oxide  400 mg Oral BID  . potassium chloride  40 mEq Oral BID  . sacubitril-valsartan  1 tablet Oral BID  . sodium chloride flush  3 mL Intravenous Q12H  . sodium chloride flush  3 mL Intravenous Q12H  . spironolactone  25 mg Oral Daily   Continuous Infusions: . sodium chloride Stopped (06/20/19 1140)  . magnesium sulfate bolus IVPB 4 g (06/22/19 1128)  . milrinone 0.25 mcg/kg/min (06/22/19 1125)     LOS: 5 days    Time spent: 25 minutes spent in the coordination of care today.    Teddy Spike, DO [facility] Pager [phone]  If 7PM-7AM, please contact night-coverage [email or web address] Password [email or web address] 06/22/2019, 1:06 PM

## 2019-06-22 NOTE — Progress Notes (Signed)
CSW referred for LVAD work up. CSW met at bedside with patient to discuss LVAD. Patient is well known to this CSW from HF clinic. Patient was very tearful about the prospect of having an LVAD. She stated frustration as she thought she was improving from her last report. Patient spoke at length about her life and the possibility of living with an LVAD. CSW provided supportive intervention and discussed LVAD implant, caregiver and the VAD Support group. Patient appears to be overwhelmed with her current health status and unsure about VAD. CSW will continue to follow along with Tammy Sours, LCSW HF clinic CSW. Raquel Sarna, [place], [place]

## 2019-06-22 NOTE — Progress Notes (Addendum)
   06/22/19 1751  Vitals  BP (!) 66/36  MAP (mmHg) (!) 44  ECG Heart Rate 84  Resp 19  Oxygen Therapy  SpO2 93 %  MEWS Score  MEWS RR 0  MEWS Pulse 0  MEWS Systolic 3  MEWS LOC 0  MEWS Temp 0  MEWS Score 3  MEWS Score Color Yellow  barrett PA made aware orders to do a manual bp. Give 200cc ns bolus

## 2019-06-22 NOTE — Progress Notes (Signed)
   Called by nursing staff .   Kirsten Wheeler complaining of fatigue and mild headache. Says she feels a little better on milrinone.   Cardiac Index pre milrinone 1.8.   Plan to continue milrinone 0.25 mcg. Check CO-OX once tunneled PICC placed.    [name] Clegg NP-C  3:12 PM

## 2019-06-22 NOTE — Progress Notes (Signed)
[name], female   DOB: 17-Apr-1981, 38 y.o.   MRN: [id number]     Advanced Heart Failure Rounding Note  PCP-Cardiologist: No primary care provider on file.   Subjective:    Good diuresis again yesterday, weight down 5 lbs. BP 90s/50s.  I started her on milrinone 0.25 yesterday.     CXR: Vascular congestion, no PNA.   RHC Procedural Findings (10/5): Hemodynamics (mmHg) RA mean 7 RV 49/10 PA 65/25 mean 45 PCWP mean 27 Oxygen saturations: PA 57% AO 100% Cardiac Output (Fick) 4.65  Cardiac Index (Fick) 1.82 PVR 3.87 WU PAPI 5.7  Objective:   Weight Range: (!) 163.4 kg Body mass index is 59.95 kg/m.   Vital Signs:   Temp:  [97.5 F (36.4 C)-98.6 F (37 C)] 97.7 F (36.5 C) (10/06 0843) Pulse Rate:  [72-103] 93 (10/06 0843) Resp:  [13-26] 19 (10/06 0843) BP: (82-109)/(51-85) 102/51 (10/06 0843) SpO2:  [91 %-99 %] 91 % (10/06 0843) Weight:  [163.4 kg] 163.4 kg (10/06 0347) Last BM Date: 06/21/19  Weight change: Filed Weights   06/20/19 0321 06/21/19 0010 06/22/19 0347  Weight: (!) 168.5 kg (!) 165.6 kg (!) 163.4 kg    Intake/Output:   Intake/Output Summary (Last 24 hours) at 06/22/2019 0854 Last data filed at 06/22/2019 0500 Gross per 24 hour  Intake 818.7 ml  Output 2900 ml  Net -2081.3 ml      Physical Exam    General: NAD, obese Neck: JVP 8 cm, no thyromegaly or thyroid nodule.  Lungs: Soft end expiratory wheezes. CV: Nondisplaced PMI.  Heart regular S1/S2, no S3/S4, no murmur.  No peripheral edema.   Abdomen: Soft, nontender, no hepatosplenomegaly, no distention.  Skin: Intact without lesions or rashes.  Neurologic: Alert and oriented x 3.  Psych: Normal affect. Extremities: No clubbing or cyanosis.  HEENT: Normal.   Telemetry   NSR 80s, personally reviewed.   Labs    CBC Recent Labs    06/21/19 0759 06/22/19 0530  WBC  --  9.2  HGB 14.6  14.6 14.7  HCT 43.0  43.0 42.5  MCV  --  79.1*  PLT  --  337   Basic  Metabolic Panel Recent Labs    40/98/11 0316 06/21/19 0227 06/21/19 0759 06/22/19 0530  NA 134* 133* 139  138 134*  K 3.9 3.8 3.4*  3.5 3.6  CL 94* 92*  --  92*  CO2 28 30  --  28  GLUCOSE 172* 180*  --  192*  BUN 16 23*  --  27*  CREATININE 0.85 1.07*  --  1.22*  CALCIUM 9.5 10.2  --  10.1  MG 1.6*  --   --  1.5*   Liver Function Tests Recent Labs    06/22/19 0530  AST 29  ALT 28  ALKPHOS 50  BILITOT 1.1  PROT 7.6  ALBUMIN 3.3*   No results for input(s): LIPASE, AMYLASE in the last 72 hours. Cardiac Enzymes No results for input(s): CKTOTAL, CKMB, CKMBINDEX, TROPONINI in the last 72 hours.  BNP: BNP (last 3 results) Recent Labs    08/25/18 1020 06/17/19 1957  BNP 126.8* 414.8*    ProBNP (last 3 results) No results for input(s): PROBNP in the last 8760 hours.   D-Dimer No results for input(s): DDIMER in the last 72 hours. Hemoglobin A1C No results for input(s): HGBA1C in the last 72 hours. Fasting Lipid Panel No results for input(s): CHOL, HDL, LDLCALC, TRIG, CHOLHDL, LDLDIRECT in the  last 72 hours. Thyroid Function Tests No results for input(s): TSH, T4TOTAL, T3FREE, THYROIDAB in the last 72 hours.  Invalid input(s): FREET3  Other results:   Imaging    No results found.   Medications:     Scheduled Medications: . atorvastatin  10 mg Oral Daily  . carvedilol  3.125 mg Oral BID WC  . dextromethorphan-guaiFENesin  1 tablet Oral BID  . digoxin  0.125 mg Oral Daily  . enoxaparin (LOVENOX) injection  0.5 mg/kg Subcutaneous Q24H  . furosemide  80 mg Intravenous BID  . insulin aspart  0-15 Units Subcutaneous TID WC  . insulin aspart  0-5 Units Subcutaneous QHS  . insulin glargine  4 Units Subcutaneous Daily  . ipratropium-albuterol  3 mL Nebulization Q6H  . ivabradine  5 mg Oral BID WC  . magnesium oxide  400 mg Oral BID  . potassium chloride  40 mEq Oral BID  . sacubitril-valsartan  1 tablet Oral BID  . sodium chloride flush  3 mL  Intravenous Q12H  . sodium chloride flush  3 mL Intravenous Q12H  . spironolactone  25 mg Oral Daily    Infusions: . sodium chloride Stopped (06/20/19 1140)  . milrinone 0.25 mcg/kg/min (06/22/19 0500)    PRN Medications: sodium chloride, acetaminophen, benzonatate, guaiFENesin, ipratropium-albuterol, ondansetron (ZOFRAN) IV, sodium chloride flush    Patient Profile   38 y/o woman with super morbid obesity, DM2, OSA, chronic systolic HF due to severe NICM (EF ~20%) presents to the ER with several days of worsening HF with SOB at rest, marked edema and orthopnea/PND not responding to increased doses of oral diuretics. Admits to highlevels of water and gatorade intake.   Assessment/Plan   1. Acute on Chronic Systolic HF: Echo this admission with EF < 20%, relatively normal RV, moderate MR. NICM with [employer] ICD. Acute exacerbation in the setting of dietary indiscretion w/ sodium. Pt believes she is ~20 lb above dry weight. She has now lost 15 lbs with diuresis.  RHC yesterday with low output (CI 1.8) and elevated PCWP but near normal RA pressure.  Minimal JVP on exam today and no edema. I started her on milrinone yesterday with low output. She feels better today.  - Will give 1 dose IV Lasix today, restart po diuretic tomorrow, torsemide 80 qam/60 qpm.    - With soft BP (no significant orthostatic symptoms), I decreased Coreg to 3.125 mg bid and Entresto to 49/51 bid. - I have added ivabradine 5 mg bid.  - Continue spironolactone 25 daily.  - Continue digoxin 0.125 mg - Continue milrinone 0.25, will place tunneled catheter for use at home.  - Patient has low output HF, had low output on prior RHC as well.  CPX with probable severe functional impairment.  I think we are at the point where we need to think about LVAD.  Her size and social support will be issues.  She would not be a transplant candidate at this point due to weight. We discussed her at [facility], Dr. Prescott Gum has seen.  She will  need significant weight loss to get to LVAD.  Will use home milrinone to try to facilitate increased activity to work towards weight loss.  She understands that milrinone is not a good end-point and that we would like to use it as a bridge to eventual LVAD. RV function seems adequate for LVAD.  2. OSA: Has been refusing CPAP here as she does not like the mask here.  3. Morbid obesity: Continue  to work on weight loss.  This will be imperative as she cannot get to LVAD without weight loss.  - Will ask our social worker to reach out to [employer] [facility] again.  4. Asthma: Wheezy on exam with history of asthma.  No PNA on CXR.  Possibly more due to pulmonary edema. Improved.  - [facility] following, steroids stopped.    Length of Stay: 5  Marca Ancona, MD  06/22/2019, 8:54 AM  Advanced Heart Failure Team Pager [phone] (M-F; 7a - 4p)  Please contact [facility] for night-coverage after hours (4p -7a ) and weekends on [email or web address]

## 2019-06-22 NOTE — TOC Progression Note (Addendum)
Transition of Care [facility]) - Progression Note    Patient Details  Name: Kirsten Wheeler MRN: [id number] Date of Birth: 05-08-1981  Transition of Care [facility]) CM/SW Contact  Zenon Mayo, RN Phone Number: 06/22/2019, 12:54 PM  Clinical Narrative:    NCM spoke with patient offered choice for home milrinone, she states she has no preference, who ever can staff the [facility].  NCM made referral to [facility] with [facility], awaiting to hear back. Per Butch Penny with [facility], they can not take referral.NCM made referral to [facility], [facility] and [facility], they all could not take referral. NCM made referral to [facility] with [facility], awaiting a call back.        Expected Discharge Plan and Services                                                 Social Determinants of Health (SDOH) Interventions    Readmission Risk Interventions No flowsheet data found.

## 2019-06-22 NOTE — Progress Notes (Signed)
   Called to see pt re: low BP  Pt BP had been 90s-100s for last 24 hr, then SBP dropped into the 60s. Confirmed by repeat measurement, L arm was lower. Manual cuff about the same.   Pt c/o some mild chest tightness, is a little light-headed and weak, but A&O x 3. Breathing ok, no other complaints.   Contacted Dr Aundra Dubin, he stated we should continue to try and get an accurate BP, feels the readings may not be accurate. OK to give up to 200 cc fluid bolus to support BP. Continue Milrinone.  Pt had been off milrinone for about 30", the bag had run out and RN was waiting to see if we would continue it. SBP increased to 89.   Discussed w/ staff. Continue to try and get an accurate BP. Correlate that with LOC, and be aware BP readings may be off.  PICC line will not be done tonight, so do not have CVPs.   OK to hold Coreg and Lasix for SBP < 85.   Continue to work to get accurate BP readings.   Follow for sx.  Rosaria Ferries, PA-C 06/22/2019 6:27 PM Beeper [phone]

## 2019-06-22 NOTE — Progress Notes (Signed)
Visit made to patients room to give scheduled nebulizer treatment.  Patient states she's breathing fine and don't feel she needs them at this time.  I advised patient if she changes her mind to have RN call RT.

## 2019-06-22 NOTE — Progress Notes (Signed)
   06/22/19 1816  Vitals  BP (!) 89/70  MAP (mmHg) 78  BP Location Right Wrist  BP Method Automatic  Patient Position (if appropriate) Lying  Pulse Rate 79  ECG Heart Rate 79  Resp 20  Oxygen Therapy  SpO2 94 %  MEWS Score  MEWS RR 0  MEWS Pulse 0  MEWS Systolic 1  MEWS LOC 0  MEWS Temp 0  MEWS Score 1  MEWS Score Color Green  restarted milnirone per PA

## 2019-06-22 NOTE — Progress Notes (Signed)
CSW following patient through outpatient Advanced Heart Failure Clinic.  CSW met with pt at bedside to check in an provide support.  Pt somewhat overwhelmed by current hospital stay and poor heart functioning at this time- is scared of what this means for her life and for her children.  CSW provided active listening and support.  Pt is very motivated to do what the doctors are recommending and feels like she can lose the weight that they are wanting her to lose to hopefully become a candidate for LVAD.  Pt is agreeable to all the outpatient support she is able to get from the clinic to help keep her on track.    CSW will continue to follow and assist as needed when patient is discharged.  Jorge Ny, LCSW Clinical Social Worker Advanced Heart Failure Clinic Desk#: [phone] Cell#: [phone]

## 2019-06-22 NOTE — Plan of Care (Signed)
Nutrition Education Note  RD consulted for nutrition education regarding diabetes and weight loss.  Spoke with pt at bedside. Pt reports that she does not remember much from discussion with Diabetes Coordinator yesterday. Pt states that she was diagnosed with diabetes "a while ago" but has just recently started "taking it seriously." Pt states she didn't realize how serious diabetes was.  Pt reports that recently she has tried to eat more vegan meals. Pt reports that in terms of dietary changes related to her diabetes, she has cut down on fried foods and stopped drinking Pepsi. Pt states she does drink ginger ale "but that's not a real soda to me." Discussed how ginger ale and fruit juices (which pt reports drinking but "waters them down") have the same affect on pt's blood sugar as other sugar-sweetened beverages. Encouraged pt to avoid any type of sugar-sweetened beverage.  Pt states that when she has a meal, it is normally pretty balanced. For example, pt may have curry baked chicken with broccoli and cauliflower rice.  Pt was not aware of the different between starchy and non-starchy vegetables. Discussed this with pt.   Lab Results  Component Value Date   HGBA1C 9.4 (H) 06/17/2019    RD provided "Carbohydrate Counting for People with Diabetes" handout from the Academy of Nutrition and Dietetics. Discussed different food groups and their effects on blood sugar, emphasizing carbohydrate-containing foods. Provided list of carbohydrates and recommended serving sizes of common foods.  Discussed importance of controlled and consistent carbohydrate intake throughout the day. Provided examples of ways to balance meals/snacks and encouraged intake of high-fiber, whole grain complex carbohydrates. Teach back method used.  Expect fair compliance.  Body mass index is 59.95 kg/m. Pt meets criteria for obesity class III based on current BMI.  Current diet order is Heart Healthy, patient is consuming  approximately 100% of meals at this time. Labs and medications reviewed. No further nutrition interventions warranted at this time. RD contact information provided. If additional nutrition issues arise, please re-consult RD.   Gaynell Face, MS, RD, LDN Inpatient Clinical Dietitian Pager: [phone] Weekend/After Hours: [phone]

## 2019-06-22 NOTE — Plan of Care (Signed)

## 2019-06-23 ENCOUNTER — Inpatient Hospital Stay (HOSPITAL_COMMUNITY): Payer: Medicaid Other

## 2019-06-23 ENCOUNTER — Encounter (HOSPITAL_COMMUNITY): Payer: Self-pay | Admitting: Interventional Radiology

## 2019-06-23 DIAGNOSIS — R0600 Dyspnea, unspecified: Secondary | ICD-10-CM

## 2019-06-23 HISTORY — PX: IR US GUIDE VASC ACCESS RIGHT: IMG2390

## 2019-06-23 HISTORY — PX: IR FLUORO GUIDE CV LINE RIGHT: IMG2283

## 2019-06-23 LAB — GLUCOSE, CAPILLARY
Glucose-Capillary: 163 mg/dL — ABNORMAL HIGH (ref 70–99)
Glucose-Capillary: 269 mg/dL — ABNORMAL HIGH (ref 70–99)
Glucose-Capillary: 188 mg/dL — ABNORMAL HIGH (ref 70–99)
Glucose-Capillary: 216 mg/dL — ABNORMAL HIGH (ref 70–99)

## 2019-06-23 LAB — BASIC METABOLIC PANEL
Anion gap: 14 (ref 5–15)
BUN: 31 mg/dL — ABNORMAL HIGH (ref 6–20)
CO2: 29 mmol/L (ref 22–32)
Calcium: 10.8 mg/dL — ABNORMAL HIGH (ref 8.9–10.3)
Chloride: 90 mmol/L — ABNORMAL LOW (ref 98–111)
Creatinine, Ser: 1.19 mg/dL — ABNORMAL HIGH (ref 0.44–1.00)
GFR calc Af Amer: >60 mL/min (ref >60)
GFR calc non Af Amer: 59 mL/min — ABNORMAL LOW (ref >60)
Glucose, Bld: 175 mg/dL — ABNORMAL HIGH (ref 70–99)
Potassium: 3.6 mmol/L (ref 3.5–5.1)
Sodium: 133 mmol/L — ABNORMAL LOW (ref 135–145)

## 2019-06-23 LAB — LUPUS ANTICOAGULANT PANEL
DRVVT: 37 s (ref 0.0–47.0)
PTT Lupus Anticoagulant: 34 s (ref 0.0–51.9)

## 2019-06-23 LAB — HEPATITIS B SURFACE ANTIBODY, QUANTITATIVE: Hep B S AB Quant (Post): <3.1 m[IU]/mL — ABNORMAL LOW (ref >9.9)

## 2019-06-23 LAB — MAGNESIUM: Magnesium: 2 mg/dL (ref 1.7–2.4)

## 2019-06-23 MED ORDER — SODIUM CHLORIDE 0.9% FLUSH: 10.0000 mL | INTRAVENOUS | Status: DC | PRN

## 2019-06-23 MED ORDER — TORSEMIDE 20 MG PO TABS
80.0000 mg | ORAL_TABLET | Freq: Every day | ORAL | Status: DC
  Administered 2019-06-23 – 2019-06-25 (×3): 80 mg via ORAL
  Filled 2019-06-23 (×2): qty 4

## 2019-06-23 MED ORDER — LIDOCAINE HCL (PF) 1 % IJ SOLN
INTRAMUSCULAR | Status: AC | PRN
  Administered 2019-06-23: 10 mL

## 2019-06-23 MED ORDER — TORSEMIDE 20 MG PO TABS
60.0000 mg | ORAL_TABLET | Freq: Every day | ORAL | Status: DC
  Administered 2019-06-23 – 2019-06-24 (×2): 60 mg via ORAL
  Filled 2019-06-23 (×2): qty 3

## 2019-06-23 MED ORDER — HEPARIN SOD (PORK) LOCK FLUSH 100 UNIT/ML IV SOLN
INTRAVENOUS | Status: AC | PRN
  Administered 2019-06-23: 500 [IU] via INTRAVENOUS

## 2019-06-23 MED ORDER — HEPARIN SOD (PORK) LOCK FLUSH 100 UNIT/ML IV SOLN
INTRAVENOUS | Status: AC
  Filled 2019-06-23: qty 5

## 2019-06-23 MED ORDER — CHLORHEXIDINE GLUCONATE CLOTH 2 % EX PADS
6.0000 | MEDICATED_PAD | Freq: Every day | CUTANEOUS | Status: DC
  Administered 2019-06-23 – 2019-06-25 (×3): 6 via TOPICAL

## 2019-06-23 MED ORDER — POTASSIUM CHLORIDE CRYS ER 20 MEQ PO TBCR
40.0000 meq | EXTENDED_RELEASE_TABLET | Freq: Once | ORAL | Status: AC
  Administered 2019-06-23: 40 meq via ORAL
  Filled 2019-06-23: qty 2

## 2019-06-23 MED ORDER — LIDOCAINE HCL 1 % IJ SOLN
INTRAMUSCULAR | Status: AC
  Filled 2019-06-23: qty 20

## 2019-06-23 MED ORDER — SACUBITRIL-VALSARTAN 24-26 MG PO TABS
1.0000 | ORAL_TABLET | Freq: Two times a day (BID) | ORAL | Status: DC
  Administered 2019-06-23 – 2019-06-25 (×5): 1 via ORAL
  Filled 2019-06-23 (×6): qty 1

## 2019-06-23 NOTE — Progress Notes (Signed)
Physical Therapy Treatment Patient Details Name: Kirsten Wheeler MRN: [id number] DOB: [birth year] Today's Date: 06/23/2019    History of Present Illness Pt is a 38 y/o female admitted secondary to worsening SOB. Thought to be secondary to CHF exacerbation. s/p cardiac cath 10/5. PMH includes obesity, asthma, s/p ICD, DM, CHF, and OSA on CPAP.    PT Comments    The patient is motivated to ambulate, 300' today. HR 102. Continue progressive  Ambulation. Patient has 14 steps to her BR. Will attempt to simulate a few steps in room next visit.   Follow Up Recommendations  No PT follow up     Equipment Recommendations  None recommended by PT    Recommendations for Other Services       Precautions / Restrictions Precautions Precaution Comments: watch BP    Mobility  Bed Mobility Overal bed mobility: Independent                Transfers   Equipment used: None Transfers: Sit to/from Stand Sit to Stand: Modified independent (Device/Increase time)         General transfer comment: Able to stand without issues or assist from EOB.  Ambulation/Gait Ambulation/Gait assistance: Supervision;Min guard Gait Distance (Feet): 300 Feet Assistive device: 1 person hand held assist Gait Pattern/deviations: Step-through pattern;Decreased stride length;Wide base of support     General Gait Details: Provided HHA at times, gait is slow. HR 102,   Stairs             Wheelchair Mobility    Modified Rankin (Stroke Patients Only)       Balance   Sitting-balance support: No upper extremity supported;Feet supported Sitting balance-Leahy Scale: Good     Standing balance support: No upper extremity supported;During functional activity Standing balance-Leahy Scale: Fair                              Cognition Arousal/Alertness: Awake/alert Behavior During Therapy: WFL for tasks assessed/performed                                           Exercises      General Comments        Pertinent Vitals/Pain Pain Assessment: Faces Faces Pain Scale: No hurt Pain Location: states  feet feel funny Pain Intervention(s): Monitored during session    Home Living                      Prior Function            PT Goals (current goals can now be found in the care plan section) Progress towards PT goals: Progressing toward goals    Frequency    Min 3X/week      PT Plan Current plan remains appropriate    Co-evaluation              AM-PAC PT "6 Clicks" Mobility   Outcome Measure  Help needed turning from your back to your side while in a flat bed without using bedrails?: None Help needed moving from lying on your back to sitting on the side of a flat bed without using bedrails?: None Help needed moving to and from a bed to a chair (including a wheelchair)?: None Help needed standing up from a chair using your arms (e.g., wheelchair or bedside chair)?: A  Little Help needed to walk in hospital room?: A Little Help needed climbing 3-5 steps with a railing? : A Little 6 Click Score: 21    End of Session   Activity Tolerance: Patient tolerated treatment well Patient left: in bed;with call bell/phone within reach Nurse Communication: Mobility status PT Visit Diagnosis: Difficulty in walking, not elsewhere classified (R26.2)     Time: 6834-1962 PT Time Calculation (min) (ACUTE ONLY): 20 min  Charges:  $Gait Training: 8-22 mins                     Blanchard Kelch PT Acute Rehabilitation Services Pager [phone] Office [phone]    Rada Hay 06/23/2019, 3:29 PM

## 2019-06-23 NOTE — Progress Notes (Signed)
CSW stopped by to offer support to patient. She shared updated plan and "just got back from getting my PICC line". CSW and patient discussed briefly plan for home and she shared "I have to lose weight". CSW provided patient with an adult coloring book to assist with stress and meditation. Patient appreciative of visit. CSW available as needed. Raquel Sarna, [place], [place]

## 2019-06-23 NOTE — Procedures (Signed)
  Procedure: R IJ tunneled SL CVC   EBL:   minimal Complications:  none immediate  See full dictation in [facility] PACS.  D. Edie Darley MD Main # [phone] Pager  [phone]    

## 2019-06-23 NOTE — Progress Notes (Signed)
Patient ID: Saleen Atmore, female   DOB: [birth year], 38 y.o.   MRN: [id number]     Advanced Heart Failure Rounding Note  PCP-Cardiologist: No primary care provider on file.   Subjective:    Good diuresis again yesterday, weight down 2 lbs. She continues on milrinone 0.25, did not get her tunneled catheter yesterday.  BP dropped yesterday afternoon and she felt bad.  Now recovered and feels fine.   CXR: Vascular congestion, no PNA.   RHC Procedural Findings (10/5): Hemodynamics (mmHg) RA mean 7 RV 49/10 PA 65/25 mean 45 PCWP mean 27 Oxygen saturations: PA 57% AO 100% Cardiac Output (Fick) 4.65  Cardiac Index (Fick) 1.82 PVR 3.87 WU PAPI 5.7  Objective:   Weight Range: (!) 162.5 kg Body mass index is 59.62 kg/m.   Vital Signs:   Temp:  [97.4 F (36.3 C)-98.2 F (36.8 C)] 98.2 F (36.8 C) (10/07 0316) Pulse Rate:  [69-102] 102 (10/07 0633) Resp:  [14-26] 25 (10/07 0753) BP: (43-115)/(26-83) 115/78 (10/07 0633) SpO2:  [91 %-99 %] 98 % (10/07 0316) Weight:  [162.5 kg] 162.5 kg (10/07 0500) Last BM Date: 06/22/19  Weight change: Filed Weights   06/21/19 0010 06/22/19 0347 06/23/19 0500  Weight: (!) 165.6 kg (!) 163.4 kg (!) 162.5 kg    Intake/Output:   Intake/Output Summary (Last 24 hours) at 06/23/2019 0758 Last data filed at 06/23/2019 0400 Gross per 24 hour  Intake 1358.98 ml  Output 3600 ml  Net -2241.02 ml      Physical Exam    General: NAD Neck: JVP 8 cm, no thyromegaly or thyroid nodule.  Lungs: Clear to auscultation bilaterally with normal respiratory effort. CV: Nonpalpable PMI.  Heart regular S1/S2, no S3/S4, no murmur.  No peripheral edema.  Abdomen: Soft, nontender, no hepatosplenomegaly, no distention.  Skin: Intact without lesions or rashes.  Neurologic: Alert and oriented x 3.  Psych: Normal affect. Extremities: No clubbing or cyanosis.  HEENT: Normal.    Telemetry   NSR 80s, personally reviewed.   Labs    CBC Recent Labs   06/21/19 0759 06/22/19 0530  WBC  --  9.2  HGB 14.6  14.6 14.7  HCT 43.0  43.0 42.5  MCV  --  79.1*  PLT  --  337   Basic Metabolic Panel Recent Labs    71/24/58 0530 06/22/19 1830 06/23/19 0224  NA 134* 131* 133*  K 3.6 3.6 3.6  CL 92* 89* 90*  CO2 28 27 29   GLUCOSE 192* 262* 175*  BUN 27* 31* 31*  CREATININE 1.22* 1.58* 1.19*  CALCIUM 10.1 10.2 10.8*  MG 1.5*  --  2.0   Liver Function Tests Recent Labs    06/22/19 0530  AST 29  ALT 28  ALKPHOS 50  BILITOT 1.1  PROT 7.6  ALBUMIN 3.3*   No results for input(s): LIPASE, AMYLASE in the last 72 hours. Cardiac Enzymes No results for input(s): CKTOTAL, CKMB, CKMBINDEX, TROPONINI in the last 72 hours.  BNP: BNP (last 3 results) Recent Labs    08/25/18 1020 06/17/19 1957  BNP 126.8* 414.8*    ProBNP (last 3 results) No results for input(s): PROBNP in the last 8760 hours.   D-Dimer No results for input(s): DDIMER in the last 72 hours. Hemoglobin A1C No results for input(s): HGBA1C in the last 72 hours. Fasting Lipid Panel No results for input(s): CHOL, HDL, LDLCALC, TRIG, CHOLHDL, LDLDIRECT in the last 72 hours. Thyroid Function Tests No results for input(s): TSH, T4TOTAL, T3FREE,  THYROIDAB in the last 72 hours.  Invalid input(s): FREET3  Other results:   Imaging    No results found.   Medications:     Scheduled Medications: . atorvastatin  10 mg Oral Daily  . carvedilol  3.125 mg Oral BID WC  . dextromethorphan-guaiFENesin  1 tablet Oral BID  . digoxin  0.125 mg Oral Daily  . enoxaparin (LOVENOX) injection  0.5 mg/kg Subcutaneous Q24H  . insulin aspart  0-15 Units Subcutaneous TID WC  . insulin aspart  0-5 Units Subcutaneous QHS  . insulin glargine  5 Units Subcutaneous Daily  . ipratropium-albuterol  3 mL Nebulization Q6H  . ivabradine  5 mg Oral BID WC  . magnesium oxide  400 mg Oral BID  . potassium chloride  40 mEq Oral BID  . sacubitril-valsartan  1 tablet Oral BID  . sodium  chloride flush  3 mL Intravenous Q12H  . sodium chloride flush  3 mL Intravenous Q12H  . spironolactone  25 mg Oral Daily    Infusions: . sodium chloride Stopped (06/20/19 1140)  . 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 Admits to highlevels of water and gatorade intake.   Assessment/Plan   1. Acute on Chronic Systolic HF: Echo this admission with EF < 20%, relatively normal RV, moderate MR. NICM with [employer] ICD. Acute exacerbation in the setting of dietary indiscretion w/ sodium. Pt believes she is ~20 lb above dry weight. She has now lost 17 lbs with diuresis.  RHC 10/5 with low output (CI 1.8) and elevated PCWP but near normal RA pressure.  Minimal JVP on exam today and no edema. I started her on milrinone with low output. BP dropped yesterday afternoon and she got lightheaded, feels better today.  - Start back on torsemide 80 qam/60 qpm today.  - Continue Coreg 3.125 mg bid.  - With ongoing low BP episodes that are symptomatic, will decrease Entresto to 24/26 bid.  - I have added ivabradine 5 mg bid.  - Continue spironolactone 25 daily.  - Continue digoxin 0.125 mg - Continue milrinone 0.25, will place tunneled catheter for use at home => hopefully will get today.  - Patient has low output HF, had low output on prior RHC as well.  CPX with probable severe functional impairment.  I think we are at the point where we need to think about LVAD.  Her size and social support will be issues.  She would not be a transplant candidate at this point due to weight. We  discussed her at [facility], Dr. Prescott Gum has seen.  She will need significant weight loss to get to LVAD.  Will use home milrinone to try to facilitate increased activity to work towards weight loss.  She understands that milrinone is not a good end-point and that we would like to use it as a bridge to eventual LVAD. RV function seems adequate for LVAD.  2. OSA: Has been refusing CPAP here as she does not like the mask here.  3. Morbid obesity: Continue to work on weight loss.  This will be imperative as she cannot get to LVAD  without weight loss.  - Will ask our social worker to reach out to [facility] [facility] again.  4. Asthma: Wheezy on exam with history of asthma.  No PNA on CXR.  Possibly more due to pulmonary edema. Improved.  - [facility] following, steroids stopped.    Length of Stay: 6  Marca Ancona, MD  06/23/2019, 7:58 AM  Advanced Heart Failure Team Pager [phone] (M-F; 7a - 4p)  Please contact [facility] for night-coverage after hours (4p -7a ) and weekends on [email or web address]

## 2019-06-23 NOTE — Progress Notes (Signed)
PROGRESS NOTE    Sailor Kallenberger  TDH:[id number] DOB: 18-Nov-1980 DOA: 06/17/2019 PCP: Eunice Blase, PA-C    Brief Narrative:  38 y.o.femalewith medical history significant of postpartum cardiomyopathy status post ICD, obstructive sleep apnea on CPAP, tobacco abuse, morbid obesity with BMI of 62, asthma presents to emergency department due to shortness of breath, leg swelling since 4 days. Reports association with orthopnea, PND, palpitation, cough, congestion however denies chest pain, fever, chills, runny nose, sore throat, recent COVID-19 exposure, headache, blurry vision, lightheadedness, dizziness, nausea, vomiting, abdominal pain, diarrhea, constipation, sleep or appetite changes.  Assessment & Plan:   Principal Problem:   Acute on chronic systolic CHF (congestive heart failure) (HCC) Active Problems:   Morbid obesity- BMI 57   Peripartum cardiomyopathy   NICM (nonischemic cardiomyopathy) (HCC)   Tobacco abuse   Asthma   Hypokalemia   Obstructive sleep apnea   ICD (implantable cardioverter-defibrillator) in place   Diabetes mellitus (HCC)  Acute on chronic CHF: - Due to severe nonischemic cardiomyopathy. Has ICD placed in 2018. - Patient presented with symptoms of fluid overload, chest x-ray reviewed which showed cardiomegaly-suspect mild CHF. - echo from 7/19 which showed ejection fraction of 20 to 25%. - Cardiology following and managing heart failure -LHC results noted - per cards -Possible LVAD in the future, trial of milrinone in the meantime while pt addresses her weight.  -Pt seen by CT surgery  Asthma: - continue PRNduonebs - seems much improved, off steroids -Cont on room air  Hypokalemia Hypomagnesemia: -Electrolytes reviewed, stable at this time -Continue scheduled potassium replacement -Repeat bmet in AM  Obstructive sleep apnea:  - On CPAP at home - We will continue CPAP while she is in the  hospital. -Cont to encourage compliance with cpap  Morbid obesity:  - With BMI of 62. - Patient needs extensive counseling regarding dietary modification, exercise and weight loss. - long term will need follow up with bariatric surgery but needs heart stabilization first  -Recommend diet/lifestyle modification  Tobacco abuse: - cessation encouraged  Type 2 diabetes mellitus: - A1c is 9.4 - Cont SSI coverage -Glucose trends overall stable, however mid-200's glucose noted - Per diabetic coordinator, may benefit from sulfonylurea on d/c  DVT prophylaxis: Lovenox subQ Code Status: Full Family Communication: Pt in room, family not at bedside Disposition Plan: Uncertain at this time  Consultants:   Cariology  CTS  Procedures:     Antimicrobials: Anti-infectives (From admission, onward)   None      Subjective: Without complaints post-PICC placement this AM  Objective: Vitals:   06/23/19 0753 06/23/19 0806 06/23/19 1119 06/23/19 1335  BP:   (!) 114/51   Pulse:      Resp: (!) 25  13   Temp:   97.8 F (36.6 C)   TempSrc:   Oral   SpO2:  96%  99%  Weight:      Height:        Intake/Output Summary (Last 24 hours) at 06/23/2019 1409 Last data filed at 06/23/2019 0400 Gross per 24 hour  Intake 796.52 ml  Output 1600 ml  Net -803.48 ml   Filed Weights   06/21/19 0010 06/22/19 0347 06/23/19 0500  Weight: (!) 165.6 kg (!) 163.4 kg (!) 162.5 kg    Examination:  General exam: Appears calm and comfortable  Respiratory system: Clear to auscultation. Respiratory effort normal. Cardiovascular system: S1 & S2 heard, RRR Gastrointestinal system: Abdomen is nondistended, soft and nontender. No organomegaly or masses felt. Normal bowel sounds heard  morbidly obese Central nervous system: Alert and oriented. No focal neurological deficits. Extremities: Symmetric 5 x 5 power. Skin: No rashes, lesions Psychiatry: Judgement and insight appear normal.  Mood & affect appropriate.   Data Reviewed: I have personally reviewed following labs and imaging studies  CBC: Recent Labs  Lab 06/17/19 1347 06/19/19 0219 06/21/19 0759 06/22/19 0530  WBC 9.3 9.9  --  9.2  NEUTROABS  --  6.3  --   --   HGB 12.6 12.5 14.6  14.6 14.7  HCT 36.0 36.4 43.0  43.0 42.5  MCV 80.2 80.7  --  79.1*  PLT 247 276  --  154   Basic Metabolic Panel: Recent Labs  Lab 06/17/19 1957  06/19/19 0219 06/20/19 0316 06/21/19 0227 06/21/19 0759 06/22/19 0530 06/22/19 1830 06/23/19 0224  NA  --    < > 136 134* 133* 139  138 134* 131* 133*  K  --    < > 3.5 3.9 3.8 3.4*  3.5 3.6 3.6 3.6  CL  --    < > 96* 94* 92*  --  92* 89* 90*  CO2  --    < > 31 28 30   --  28 27 29   GLUCOSE  --    < > 118* 172* 180*  --  192* 262* 175*  BUN  --    < > 14 16 23*  --  27* 31* 31*  CREATININE  --    < > 0.89 0.85 1.07*  --  1.22* 1.58* 1.19*  CALCIUM  --    < > 9.0 9.5 10.2  --  10.1 10.2 10.8*  MG 1.4*  --  1.5* 1.6*  --   --  1.5*  --  2.0   < > = values in this interval not displayed.   GFR: Estimated Creatinine Clearance: 102.3 mL/min (A) (by C-G formula based on SCr of 1.19 mg/dL (H)). Liver Function Tests: Recent Labs  Lab 06/22/19 0530  AST 29  ALT 28  ALKPHOS 50  BILITOT 1.1  PROT 7.6  ALBUMIN 3.3*   No results for input(s): LIPASE, AMYLASE in the last 168 hours. No results for input(s): AMMONIA in the last 168 hours. Coagulation Profile: Recent Labs  Lab 06/22/19 0530  INR 1.1   Cardiac Enzymes: No results for input(s): CKTOTAL, CKMB, CKMBINDEX, TROPONINI in the last 168 hours. BNP (last 3 results) No results for input(s): PROBNP in the last 8760 hours. HbA1C: No results for input(s): HGBA1C in the last 72 hours. CBG: Recent Labs  Lab 06/22/19 1131 06/22/19 1556 06/22/19 2102 06/23/19 0613 06/23/19 1121  GLUCAP 217* 179* 184* 163* 269*   Lipid Profile: No results for input(s): CHOL, HDL, LDLCALC, TRIG, CHOLHDL, LDLDIRECT in the last  72 hours. Thyroid Function Tests: No results for input(s): TSH, T4TOTAL, FREET4, T3FREE, THYROIDAB in the last 72 hours. Anemia Panel: No results for input(s): VITAMINB12, FOLATE, FERRITIN, TIBC, IRON, RETICCTPCT in the last 72 hours. Sepsis Labs: No results for input(s): PROCALCITON, LATICACIDVEN in the last 168 hours.  Recent Results (from the past 240 hour(s))  SARS CORONAVIRUS 2 (TAT 6-24 HRS) Nasopharyngeal Nasopharyngeal Swab     Status: None   Collection Time: 06/17/19  5:18 PM   Specimen: Nasopharyngeal Swab  Result Value Ref Range Status   SARS Coronavirus 2 NEGATIVE NEGATIVE Final    Comment: (NOTE) SARS-CoV-2 target nucleic acids are NOT DETECTED. The SARS-CoV-2 RNA is generally detectable in upper and lower respiratory specimens during the  acute phase of infection. Negative results do not preclude SARS-CoV-2 infection, do not rule out co-infections with other pathogens, and should not be used as the sole basis for treatment or other patient management decisions. Negative results must be combined with clinical observations, patient history, and epidemiological information. The expected result is Negative. Fact Sheet for Patients: HairSlick.nohttps://www.fda.gov/media/138098/download Fact Sheet for Healthcare Providers: quierodirigir.comhttps://www.fda.gov/media/138095/download This test is not yet approved or cleared by the [place] FDA and  has been authorized for detection and/or diagnosis of SARS-CoV-2 by FDA under an Emergency Use Authorization (EUA). This EUA will remain  in effect (meaning this test can be used) for the duration of the COVID-19 declaration under Section 56 4(b)(1) of the Act, 21 U.S.C. section 360bbb-3(b)(1), unless the authorization is terminated or revoked sooner. Performed at [facility], [address] [place], [place] [address]   MRSA PCR Screening     Status: None   Collection Time: 06/18/19 12:59 PM   Specimen: Nasopharyngeal  Result Value Ref Range  Status   MRSA by PCR NEGATIVE NEGATIVE Final    Comment:        The GeneXpert MRSA Assay (FDA approved for NASAL specimens only), is one component of a comprehensive MRSA colonization surveillance program. It is not intended to diagnose MRSA infection nor to guide or monitor treatment for MRSA infections. Performed at [facility], [address] [place], [place] [address]      Radiology Studies: Ir Fluoro Guide Cv Line Right  Result Date: 06/23/2019 CLINICAL DATA:  Heart failure, needs durable venous access for IV mGy known EXAM: TUNNELED CENTRAL VENOUS CATHETER PLACEMENT WITH ULTRASOUND AND FLUOROSCOPIC GUIDANCE TECHNIQUE: The procedure, risks, benefits, and alternatives were explained to the patient. Questions regarding the procedure were encouraged and answered. The patient understands and consents to the procedure. Patency of the right IJ vein was confirmed with ultrasound with image documentation. An appropriate skin site was determined. Region was prepped using maximum barrier technique including cap and mask, sterile gown, sterile gloves, large sterile sheet, and Chlorhexidine as cutaneous antisepsis. The region was infiltrated locally with 1% lidocaine. Under real-time ultrasound guidance, the right IJ vein was accessed with a 21 gauge micropuncture needle; the needle tip within the vein was confirmed with ultrasound image documentation. 44F single cuffed powerPICC tunneled from a right anterior chest wall approach to the dermatotomy site. Needle exchanged over the 018 guidewire for transitional dilator, through which the catheter which had been cut to 25 cm was advanced under intermittent fluoroscopy, positioned with its tip at the cavoatrial junction. Spot chest radiograph confirms good catheter position. No pneumothorax. Catheter was flushed per protocol. Catheter secured externally with O Prolene suture. The right IJ dermatotomy site was closed with Dermabond. COMPLICATIONS:  COMPLICATIONS None immediate FLUOROSCOPY TIME:  0.2 minute; 380 uGym2 DAP COMPARISON:  None IMPRESSION: 1. Technically successful placement of tunneled right IJ tunneled single power injectable catheter with ultrasound and fluoroscopic guidance. Ready for routine use. Electronically Signed   By: Corlis Leak  Hassell M.D.   On: 06/23/2019 13:13   Ir Koreas Guide Vasc Access Right  Result Date: 06/23/2019 CLINICAL DATA:  Heart failure, needs durable venous access for IV mGy known EXAM: TUNNELED CENTRAL VENOUS CATHETER PLACEMENT WITH ULTRASOUND AND FLUOROSCOPIC GUIDANCE TECHNIQUE: The procedure, risks, benefits, and alternatives were explained to the patient. Questions regarding the procedure were encouraged and answered. The patient understands and consents to the procedure. Patency of the right IJ vein was confirmed with ultrasound with  image documentation. An appropriate skin site was determined. Region was prepped using maximum barrier technique including cap and mask, sterile gown, sterile gloves, large sterile sheet, and Chlorhexidine as cutaneous antisepsis. The region was infiltrated locally with 1% lidocaine. Under real-time ultrasound guidance, the right IJ vein was accessed with a 21 gauge micropuncture needle; the needle tip within the vein was confirmed with ultrasound image documentation. 56F single cuffed powerPICC tunneled from a right anterior chest wall approach to the dermatotomy site. Needle exchanged over the 018 guidewire for transitional dilator, through which the catheter which had been cut to 25 cm was advanced under intermittent fluoroscopy, positioned with its tip at the cavoatrial junction. Spot chest radiograph confirms good catheter position. No pneumothorax. Catheter was flushed per protocol. Catheter secured externally with O Prolene suture. The right IJ dermatotomy site was closed with Dermabond. COMPLICATIONS: COMPLICATIONS None immediate FLUOROSCOPY TIME:  0.2 minute; 380 uGym2 DAP COMPARISON:   None IMPRESSION: 1. Technically successful placement of tunneled right IJ tunneled single power injectable catheter with ultrasound and fluoroscopic guidance. Ready for routine use. Electronically Signed   By: Corlis Leak M.D.   On: 06/23/2019 13:13    Scheduled Meds: . atorvastatin  10 mg Oral Daily  . carvedilol  3.125 mg Oral BID WC  . dextromethorphan-guaiFENesin  1 tablet Oral BID  . digoxin  0.125 mg Oral Daily  . enoxaparin (LOVENOX) injection  0.5 mg/kg Subcutaneous Q24H  . heparin lock flush      . insulin aspart  0-15 Units Subcutaneous TID WC  . insulin aspart  0-5 Units Subcutaneous QHS  . insulin glargine  5 Units Subcutaneous Daily  . ipratropium-albuterol  3 mL Nebulization Q6H  . ivabradine  5 mg Oral BID WC  . lidocaine      . magnesium oxide  400 mg Oral BID  . potassium chloride  40 mEq Oral BID  . sacubitril-valsartan  1 tablet Oral BID  . sodium chloride flush  3 mL Intravenous Q12H  . sodium chloride flush  3 mL Intravenous Q12H  . spironolactone  25 mg Oral Daily  . torsemide  60 mg Oral q1800  . torsemide  80 mg Oral QAC breakfast   Continuous Infusions: . sodium chloride Stopped (06/20/19 1140)  . milrinone 0.25 mcg/kg/min (06/23/19 0950)     LOS: 6 days   Rickey Barbara, MD [facility] Pager On Amion  If 7PM-7AM, please contact night-coverage 06/23/2019, 2:09 PM

## 2019-06-23 NOTE — TOC Progression Note (Signed)
Transition of Care [facility]) - Progression Note    Patient Details  Name: Kirsten Wheeler MRN: [id number] Date of Birth: [birth year]  Transition of Care [facility]) CM/SW [place], [place] Phone Number: [phone] 06/23/2019, 9:32 AM  Clinical Narrative:     CSW spoke with Hoyle Sauer with [facility], they are able to accept patient for Home Health RN services for home Milrinone, Carolynn Sayers with [facility] to work with patient today for teaching.        Expected Discharge Plan and Services                                                 Social Determinants of Health (SDOH) Interventions    Readmission Risk Interventions No flowsheet data found.

## 2019-06-23 NOTE — Progress Notes (Signed)
Unable to evaluate patient's CVP or collect co-ox at this time. Pt does not have a PICC line in place currently.  Will assess CVPs and collect co-ox panels once PICC has been placed.  MD is aware.

## 2019-06-24 ENCOUNTER — Telehealth (HOSPITAL_COMMUNITY): Payer: Self-pay

## 2019-06-24 DIAGNOSIS — E109 Type 1 diabetes mellitus without complications: Secondary | ICD-10-CM

## 2019-06-24 DIAGNOSIS — E108 Type 1 diabetes mellitus with unspecified complications: Secondary | ICD-10-CM

## 2019-06-24 LAB — BASIC METABOLIC PANEL
Anion gap: 13 (ref 5–15)
BUN: 25 mg/dL — ABNORMAL HIGH (ref 6–20)
CO2: 28 mmol/L (ref 22–32)
Calcium: 10 mg/dL (ref 8.9–10.3)
Chloride: 91 mmol/L — ABNORMAL LOW (ref 98–111)
Creatinine, Ser: 1.01 mg/dL — ABNORMAL HIGH (ref 0.44–1.00)
GFR calc Af Amer: >60 mL/min (ref >60)
GFR calc non Af Amer: >60 mL/min (ref >60)
Glucose, Bld: 305 mg/dL — ABNORMAL HIGH (ref 70–99)
Potassium: 3.6 mmol/L (ref 3.5–5.1)
Sodium: 132 mmol/L — ABNORMAL LOW (ref 135–145)

## 2019-06-24 LAB — GLUCOSE, CAPILLARY
Glucose-Capillary: 174 mg/dL — ABNORMAL HIGH (ref 70–99)
Glucose-Capillary: 191 mg/dL — ABNORMAL HIGH (ref 70–99)

## 2019-06-24 LAB — COOXEMETRY PANEL
Carboxyhemoglobin: 1.1 % (ref 0.5–1.5)
Methemoglobin: 0.6 % (ref 0.0–1.5)
O2 Saturation: 68.4 %
Total hemoglobin: 15.2 g/dL (ref 12.0–16.0)

## 2019-06-24 MED ORDER — POTASSIUM CHLORIDE CRYS ER 20 MEQ PO TBCR
40.0000 meq | EXTENDED_RELEASE_TABLET | Freq: Once | ORAL | Status: AC
  Administered 2019-06-24: 09:00:00 40 meq via ORAL
  Filled 2019-06-24: qty 2

## 2019-06-24 MED ORDER — GLIPIZIDE 5 MG PO TABS: 5.0000 mg | ORAL_TABLET | Freq: Every day | ORAL | 0 refills | Status: DC

## 2019-06-24 NOTE — Discharge Summary (Signed)
Advanced Heart Failure Team  Discharge Summary   [name] MRN: [id number], DOB/AGE: 36/05/1981 36 y.o. 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 She admitted to high levels of water and Gatorade intake.   Admit wt was 380 lb (Dry wt ~360 lb). She was started on IV Lasix but had initial poor urinary response. PO metolazone added as adjunct. Repeat echo was obtained and showed  EF < 20%, relatively normal RV, moderate MR. RHC 10/5 with low output (CI 1.8) and elevated PCWP but near normal RA pressure. She was started on inotropic therapy w/ milrinone for low output, 0.25 mcg/kg/min. Also treated w/ Entresto, Coreg, spironolactone, digoxin and ivabradine. She was later transitioned off of IV Lasix to po torsemide 80 mg qam/60 mg qpm.   Unfortunately, she is not a transplant candidate due to her morbid obesity. [facility] Dr. Donata ClayVan Trigt has seen.  She will need significant weight loss to get to LVAD.  Will use home milrinone to try to facilitate increased activity to work towards weight loss.  She understands that milrinone is not a good end-point and that we would like to use it as a bridge to eventual LVAD. RV function seems adequate for LVAD. Pt is motivated to lose weight.   Tunnel cath was placed for home milrinone and home health was arranged.   On 06/25/19 pt was last seen and examined by Dr. Shirlee LatchMcLean who determined she was stable for d/c home.   Meds for discharge: milrinone 0.25, ivabradine 5 mg bid, Coreg 3.125 mg bid, digoxin 0.125, spironolactone 25 daily, Entresto 24/26 bid, KCl 40 bid, atorvastatin  10 daily  Meds were delivered to pt bedside prior to d/c [facility]. [facility]   Discharge Weight Range: 357 lb  Discharge Vitals: Blood pressure 105/66, pulse 95, temperature 97.7 F (36.5 C), temperature source Oral, resp. rate (!) 21, height 5\' 5"  (1.651 m), weight (!) 162.1 kg, SpO2 99 %.  Labs: Lab Results  Component Value Date   WBC 11.7 (H) 06/25/2019   HGB 14.9 06/25/2019   HCT 43.0 06/25/2019   MCV 79.6 (L) 06/25/2019   PLT 333 06/25/2019    Recent Labs  Lab 06/22/19 0530  06/25/19 0355  NA 134*   < > 132*  K 3.6   < > 3.8  CL 92*   < > 92*  CO2 28   < > 28  BUN 27*   < > 24*  CREATININE 1.22*   < > 0.97  CALCIUM 10.1   < > 9.5  PROT 7.6  --   --   BILITOT 1.1  --   --   ALKPHOS 50  --   --   ALT 28  --   --   AST 29  --   --   GLUCOSE 192*   < > 286*   < > = values in this interval not displayed.   Lab Results  Component Value Date   CHOL 89 06/17/2019   HDL 24 (L)  06/17/2019   LDLCALC 41 06/17/2019   TRIG 121 06/17/2019   BNP (last 3 results) Recent Labs    08/25/18 1020 06/17/19 1957  BNP 126.8* 414.8*    ProBNP (last 3 results) No results for input(s): PROBNP in the last 8760 hours.   Diagnostic Studies/Procedures  RHC Procedural Findings (10/5): Hemodynamics (mmHg) RA mean 7 RV 49/10 PA 65/25 mean 45 PCWP mean 27 Oxygen saturations: PA 57% AO 100% Cardiac Output (Fick) 4.65  Cardiac Index (Fick) 1.82 PVR 3.87 WU PAPI 5.7  2D Echo 06/18/19  Left ventricular ejection fraction, by visual estimation, is <20%. The left ventricle has severely decreased function. Severely increased left ventricular size. There is no left ventricular hypertrophy. Severe global hypokinesis. 2. Left ventricular diastolic Doppler parameters are indeterminate pattern of LV diastolic filling. 3. Global right ventricle has low normal systolic function.The right ventricular size is normal. No increase in right  ventricular wall thickness. 4. Left atrial size was severely dilated. 5. Right atrial size was mildly dilated. 6. The mitral valve is abnormal. Moderate mitral valve regurgitation. 7. The tricuspid valve is grossly normal. Tricuspid valve regurgitation is trivial. 8. The aortic valve is tricuspid Aortic valve regurgitation was not visualized by color flow Doppler. 9. The pulmonic valve was grossly normal. Pulmonic valve regurgitation is not visualized by color flow Doppler. 10. Mildly elevated pulmonary artery systolic pressure. 11. A pacer wire is visualized. 12. The inferior vena cava is dilated in size with >50% respiratory variability, suggesting right atrial pressure of 8 mmHg. 13. The interatrial septum was not well visualized.  No results found.  Discharge Medications   Allergies as of 06/25/2019      Reactions   Metformin And Related Diarrhea   And caused severe "dry mouth," also      Medication List    STOP taking these medications   Entresto 97-103 MG Generic drug: sacubitril-valsartan Replaced by: sacubitril-valsartan 24-26 MG   Pamprin Max 250-250-65 MG tablet Generic drug: aspirin-acetaminophen-caffeine     TAKE these medications   atorvastatin 10 MG tablet Commonly known as: LIPITOR Take 10 mg by mouth daily.   carvedilol 3.125 MG tablet Commonly known as: COREG Take 1 tablet (3.125 mg total) by mouth 2 (two) times daily with a meal. What changed:   medication strength  See the new instructions.  Another medication with the same name was removed. Continue taking this medication, and follow the directions you see here.   digoxin 0.125 MG tablet Commonly known as: LANOXIN Take 1 tablet (0.125 mg total) by mouth daily. What changed: See the new instructions.   glipiZIDE 5 MG tablet Commonly known as: Glucotrol Take 1 tablet (5 mg total) by mouth daily.   ipratropium-albuterol 0.5-2.5 (3) MG/3ML Soln Commonly known as: DUONEB Take 3 mLs by  nebulization every 4 (four) hours as needed. What changed: reasons to take this   ivabradine 5 MG Tabs tablet Commonly known as: CORLANOR Take 1 tablet (5 mg total) by mouth 2 (two) times daily with a meal.   milrinone 20 MG/100 ML Soln infusion Commonly known as: PRIMACOR Inject 0.0414 mg/min into the vein continuous.   potassium chloride SA 20 MEQ tablet Commonly known as: KLOR-CON Take 2 tablets (40 mEq total) by mouth 2 (two) times daily.   PRESCRIPTION MEDICATION CPAP- AT BEDTIME   ProAir HFA 108 (90 Base) MCG/ACT inhaler Generic drug: albuterol Inhale 2 puffs into the lungs every 6 (six) hours as needed for wheezing or shortness of breath.  sacubitril-valsartan 24-26 MG Commonly known as: ENTRESTO Take 1 tablet by mouth 2 (two) times daily. Replaces: Entresto 97-103 MG   spironolactone 25 MG tablet Commonly known as: Aldactone Take 1 tablet (25 mg total) by mouth daily.   torsemide 20 MG tablet Commonly known as: DEMADEX TAKE 4 TABLETS BY MOUTH EVERY MORNING AND 3 TABLETS EVERY EVENING What changed:   how much to take  how to take this  when to take this  additional instructions            Durable Medical Equipment  (From admission, onward)         Start     Ordered   06/24/19 1045  Heart failure home health orders  (Heart failure home health orders / Face to face)  Once    Comments: Heart Failure Follow-up Care:  Verify follow-up appointments per Patient Discharge Instructions. Confirm transportation arranged. Reconcile home medications with discharge medication list. Remove discontinued medications from use. Assist patient/caregiver to manage medications using pill box. Reinforce low sodium food selection Assessments: Vital signs and oxygen saturation at each visit. Assess home environment for safety concerns, caregiver support and availability of low-sodium foods. Consult Child psychotherapist, PT/OT, Dietitian, and CNA based on assessments. Perform  comprehensive cardiopulmonary assessment. Notify MD for any change in condition or weight gain of 3 pounds in one day or 5 pounds in one week with symptoms. Daily Weights and Symptom Monitoring: Ensure patient has access to scales. Teach patient/caregiver to weigh daily before breakfast and after voiding using same scale and record.    Teach patient/caregiver to track weight and symptoms and when to notify Provider. Activity: Develop individualized activity plan with patient/caregiver.\   Home Paraenteral Inotropic Therapy : Data Collection Form  Patients name: Blakelee Certo   Date: 06/24/19  Information below may not be completed by the supplier nor anyone in a Financial relationship with the supplier.  1. Results of invasive hemodynamic monitoring  Cardiac Index  Before Inotrope infusion:          1.6              On Inotrope infusion:            1.9             Drug and dose:   Milrinone 0.25 mcg  2. Cardiac medications immediately prior to inotrope infusion (List name, dose, and frequency)  Lasix   3. Dose this represent maximum tolerated doses of these medications? Yes.   4. Breathing status Prior to inotrope infusion: Dyspnea at rest  At time of discharge: Dyspnea on moderate exertion.   5. Initial home prescription Drug and Dose:   Milrinone 0.25 mcg  for continuous infusion 24/hr day and 7 days/week  6. If continuous infusion is prescribed, have attempts to discontinue inotrope infusion in the hospital failed?   Yes.  7. If intermittent infusion is prescribed, have there been repeated hospitalizations for heart failure which Parenteral inotrope were required? Not applicable.   8. Is patient capable of going to the physician for outpatient evaluation? Yes.   9. Is routine electrocardiographic monitoring required in the Home?  No.   The above statements and any additional explanations included separately are true and accurate and there is documentation present in the  patients medical record to support these statements.   Completed by Tonye Becket, NP   In instances where this form was completed by an [facility] Practice Provider, please see EMR for physician Co-Signature.  HHRN 2 wk 2 for CP assessment    OT or PT at [facility], add order:PT 2 wk 2 for CP Rehab.   [facility] to provide  Labs every other week to include BMET, Mg, and CBC with Diff. Additional as needed. Should be drawn via PERIPHERAL stick. NOT PICC line.   T0626 Milrinone 0.25 mcg/kg/min X 52 weeks A4221 Supplies for maintenance of drug infusion catheter A4222 Supplies for the external drug infusion per cassette or bag E0781 Ambulatory Infusion pump  Question Answer Comment  Heart Failure Follow-up Care [facility] at [phone]   Obtain the following labs Basic Metabolic Panel   Lab frequency Weekly   Fax lab results to [facility] Clinic at [phone]   Diet Low Sodium Heart Healthy   Fluid restrictions: 1800 mL Fluid      06/24/19 1047   06/24/19 0936  For home use only DME Shower stool  Once    Comments: Wide stool   06/24/19 9371          Disposition   The patient will be discharged in stable condition to home. Discharge Instructions    Amb Referral to Cardiac Rehabilitation   Complete by: As directed    Diagnosis: Heart Failure (see criteria below if ordering Phase II)   Heart Failure Type: Chronic Systolic   After initial evaluation and assessments completed: Virtual Based Care may be provided alone or in conjunction with Phase 2 Cardiac Rehab based on patient barriers.: Yes     Follow-up Information    [facility] on 07/07/2019.   Specialty: Cardiology Why: 11:00 AM, parking code [phone] Contact information: [address] [id number] Danice Goltz [place] [address] [phone]       [facility] Follow up.   Why: wide shower stool       [place] Follow  up.   Why: Home Health Nurse for iv milrinone and disease management Contact information: [address] [address] [phone]             Duration of Discharge Encounter: Greater than 35 minutes   Signed, Nelida Gores  06/25/2019, 10:45 AM

## 2019-06-24 NOTE — Telephone Encounter (Signed)
Received call from [facility] regarding orders received from NP.  They need clarification of orders for home PT/OT, what day labs should be drawn-how often and for how long, and need for nursing aid.  They do not have a Education officer, museum or dietician available for patient as requested in orders.  They are asking for a call to clarify these orders.   [id number]

## 2019-06-24 NOTE — Progress Notes (Addendum)
PROGRESS NOTE    Kirsten Wheeler  ZOX:[id number] DOB: 08/01/1981 DOA: 06/17/2019 PCP: Eunice Blase'Buch, Greta, PA-C    Brief Narrative:  38 y.o.femalewith medical history significant of postpartum cardiomyopathy status post ICD, obstructive sleep apnea on CPAP, tobacco abuse, morbid obesity with BMI of 62, asthma presents to emergency department due to shortness of breath, leg swelling since 4 days. Reports association with orthopnea, PND, palpitation, cough, congestion however denies chest pain, fever, chills, runny nose, sore throat, recent COVID-19 exposure, headache, blurry vision, lightheadedness, dizziness, nausea, vomiting, abdominal pain, diarrhea, constipation, sleep or appetite changes.  Assessment & Plan:   Principal Problem:   Acute on chronic systolic CHF (congestive heart failure) (HCC) Active Problems:   Morbid obesity- BMI 57   Peripartum cardiomyopathy   NICM (nonischemic cardiomyopathy) (HCC)   Tobacco abuse   Asthma   Hypokalemia   Obstructive sleep apnea   ICD (implantable cardioverter-defibrillator) in place   Diabetes mellitus (HCC)  Acute on chronic CHF: - Due to severe nonischemic cardiomyopathy. Has ICD placed in 2018. - Patient presented with symptoms of fluid overload, chest x-ray reviewed which showed cardiomegaly-suspect mild CHF. - echo from 7/19 which showed ejection fraction of 20 to 25%. - Cardiology following and managing heart failure -LHC results noted - per cards -Possible LVAD in the future, trial of milrinone in the meantime while pt addresses her weight.  -Pt seen by CT surgery this visit  Asthma: - continue PRNduonebs - seems much improved, off steroids -Cont on room air, seem stable at this time  Hypokalemia Hypomagnesemia: -Electrolytes reviewed, stable at this time -Continue scheduled potassium replacement  Obstructive sleep apnea:  - On CPAP at home - We will continue CPAP while she is in  the hospital. -Cont to encourage compliance with cpap  Morbid obesity:  - With BMI of 62. - Patient needs extensive counseling regarding dietary modification, exercise and weight loss. - long term will need follow up with bariatric surgery but needs heart stabilization first  -Recommend diet/lifestyle modification  Tobacco abuse: - cessation encouraged  Type 2 diabetes mellitus: - A1c is 9.4 - Cont SSI coverage -Glucose trends overall stable,  -Discussed with Diabetic Coordinator. Plan to discharge on glipizide 5mg  daily, ordered  DVT prophylaxis: Lovenox subQ Code Status: Full Family Communication: Pt in room, family not at bedside Disposition Plan: Uncertain at this time  Consultants:   Cariology  CTS  Procedures:     Antimicrobials: Anti-infectives (From admission, onward)   None      Subjective: No complaints this AM  Objective: Vitals:   06/24/19 0350 06/24/19 0632 06/24/19 0726 06/24/19 0814  BP: 92/75 107/60 126/80   Pulse: 98 93 (!) 54 (!) 109  Resp: (!) 24 20 19 20   Temp: 98.2 F (36.8 C)  98 F (36.7 C)   TempSrc: Oral  Oral   SpO2: 100%  98% 98%  Weight: (!) 161.9 kg     Height:        Intake/Output Summary (Last 24 hours) at 06/24/2019 1431 Last data filed at 06/24/2019 0726 Gross per 24 hour  Intake 557.81 ml  Output 400 ml  Net 157.81 ml   Filed Weights   06/22/19 0347 06/23/19 0500 06/24/19 0350  Weight: (!) 163.4 kg (!) 162.5 kg (!) 161.9 kg    Examination: General exam: Awake, laying in bed, in nad Respiratory system: Normal respiratory effort, no wheezing  Data Reviewed: I have personally reviewed following labs and imaging studies  CBC: Recent Labs  Lab 06/19/19  2482 06/21/19 0759 06/22/19 0530  WBC 9.9  --  9.2  NEUTROABS 6.3  --   --   HGB 12.5 14.6  14.6 14.7  HCT 36.4 43.0  43.0 42.5  MCV 80.7  --  79.1*  PLT 276  --  337   Basic Metabolic Panel: Recent Labs  Lab 06/17/19 1957   06/19/19 0219 06/20/19 0316 06/21/19 0227 06/21/19 0759 06/22/19 0530 06/22/19 1830 06/23/19 0224 06/24/19 0408  NA  --    < > 136 134* 133* 139  138 134* 131* 133* 132*  K  --    < > 3.5 3.9 3.8 3.4*  3.5 3.6 3.6 3.6 3.6  CL  --    < > 96* 94* 92*  --  92* 89* 90* 91*  CO2  --    < > 31 28 30   --  28 27 29 28   GLUCOSE  --    < > 118* 172* 180*  --  192* 262* 175* 305*  BUN  --    < > 14 16 23*  --  27* 31* 31* 25*  CREATININE  --    < > 0.89 0.85 1.07*  --  1.22* 1.58* 1.19* 1.01*  CALCIUM  --    < > 9.0 9.5 10.2  --  10.1 10.2 10.8* 10.0  MG 1.4*  --  1.5* 1.6*  --   --  1.5*  --  2.0  --    < > = values in this interval not displayed.   GFR: Estimated Creatinine Clearance: 120.3 mL/min (A) (by C-G formula based on SCr of 1.01 mg/dL (H)). Liver Function Tests: Recent Labs  Lab 06/22/19 0530  AST 29  ALT 28  ALKPHOS 50  BILITOT 1.1  PROT 7.6  ALBUMIN 3.3*   No results for input(s): LIPASE, AMYLASE in the last 168 hours. No results for input(s): AMMONIA in the last 168 hours. Coagulation Profile: Recent Labs  Lab 06/22/19 0530  INR 1.1   Cardiac Enzymes: No results for input(s): CKTOTAL, CKMB, CKMBINDEX, TROPONINI in the last 168 hours. BNP (last 3 results) No results for input(s): PROBNP in the last 8760 hours. HbA1C: No results for input(s): HGBA1C in the last 72 hours. CBG: Recent Labs  Lab 06/23/19 0613 06/23/19 1121 06/23/19 1557 06/23/19 2045 06/24/19 0632  GLUCAP 163* 269* 188* 216* 174*   Lipid Profile: No results for input(s): CHOL, HDL, LDLCALC, TRIG, CHOLHDL, LDLDIRECT in the last 72 hours. Thyroid Function Tests: No results for input(s): TSH, T4TOTAL, FREET4, T3FREE, THYROIDAB in the last 72 hours. Anemia Panel: No results for input(s): VITAMINB12, FOLATE, FERRITIN, TIBC, IRON, RETICCTPCT in the last 72 hours. Sepsis Labs: No results for input(s): PROCALCITON, LATICACIDVEN in the last 168 hours.  Recent Results (from the past 240  hour(s))  SARS CORONAVIRUS 2 (TAT 6-24 HRS) Nasopharyngeal Nasopharyngeal Swab     Status: None   Collection Time: 06/17/19  5:18 PM   Specimen: Nasopharyngeal Swab  Result Value Ref Range Status   SARS Coronavirus 2 NEGATIVE NEGATIVE Final    Comment: (NOTE) SARS-CoV-2 target nucleic acids are NOT DETECTED. The SARS-CoV-2 RNA is generally detectable in upper and lower respiratory specimens during the acute phase of infection. Negative results do not preclude SARS-CoV-2 infection, do not rule out co-infections with other pathogens, and should not be used as the sole basis for treatment or other patient management decisions. Negative results must be combined with clinical observations, patient history, and epidemiological information. The  expected result is Negative. Fact Sheet for Patients: SugarRoll.be Fact Sheet for Healthcare Providers: https://www.woods-mathews.com/ This test is not yet approved or cleared by the [place] FDA and  has been authorized for detection and/or diagnosis of SARS-CoV-2 by FDA under an Emergency Use Authorization (EUA). This EUA will remain  in effect (meaning this test can be used) for the duration of the COVID-19 declaration under Section 56 4(b)(1) of the Act, 21 U.S.C. section 360bbb-3(b)(1), unless the authorization is terminated or revoked sooner. Performed at [facility], [place] [address], [place], [place] [address]   MRSA PCR Screening     Status: None   Collection Time: 06/18/19 12:59 PM   Specimen: Nasopharyngeal  Result Value Ref Range Status   MRSA by PCR NEGATIVE NEGATIVE Final    Comment:        The GeneXpert MRSA Assay (FDA approved for NASAL specimens only), is one component of a comprehensive MRSA colonization surveillance program. It is not intended to diagnose MRSA infection nor to guide or monitor treatment for MRSA infections. Performed at [facility], [place]  [address], [place], [place] [address]      Radiology Studies: Ir Fluoro Guide Cv Line Right  Result Date: 06/23/2019 CLINICAL DATA:  Heart failure, needs durable venous access for IV mGy known EXAM: TUNNELED CENTRAL VENOUS CATHETER PLACEMENT WITH ULTRASOUND AND FLUOROSCOPIC GUIDANCE TECHNIQUE: The procedure, risks, benefits, and alternatives were explained to the patient. Questions regarding the procedure were encouraged and answered. The patient understands and consents to the procedure. Patency of the right IJ vein was confirmed with ultrasound with image documentation. An appropriate skin site was determined. Region was prepped using maximum barrier technique including cap and mask, sterile gown, sterile gloves, large sterile sheet, and Chlorhexidine as cutaneous antisepsis. The region was infiltrated locally with 1% lidocaine. Under real-time ultrasound guidance, the right IJ vein was accessed with a 21 gauge micropuncture needle; the needle tip within the vein was confirmed with ultrasound image documentation. 63F single cuffed powerPICC tunneled from a right anterior chest wall approach to the dermatotomy site. Needle exchanged over the 018 guidewire for transitional dilator, through which the catheter which had been cut to 25 cm was advanced under intermittent fluoroscopy, positioned with its tip at the cavoatrial junction. Spot chest radiograph confirms good catheter position. No pneumothorax. Catheter was flushed per protocol. Catheter secured externally with O Prolene suture. The right IJ dermatotomy site was closed with Dermabond. COMPLICATIONS: COMPLICATIONS None immediate FLUOROSCOPY TIME:  0.2 minute; 380 uGym2 DAP COMPARISON:  None IMPRESSION: 1. Technically successful placement of tunneled right IJ tunneled single power injectable catheter with ultrasound and fluoroscopic guidance. Ready for routine use. Electronically Signed   By: Lucrezia Europe M.D.   On: 06/23/2019 13:13   Ir US Guide Vasc Access  Right  Result Date: 06/23/2019 CLINICAL DATA:  Heart failure, needs durable venous access for IV mGy known EXAM: TUNNELED CENTRAL VENOUS CATHETER PLACEMENT WITH ULTRASOUND AND FLUOROSCOPIC GUIDANCE TECHNIQUE: The procedure, risks, benefits, and alternatives were explained to the patient. Questions regarding the procedure were encouraged and answered. The patient understands and consents to the procedure. Patency of the right IJ vein was confirmed with ultrasound with image documentation. An appropriate skin site was determined. Region was prepped using maximum barrier technique including cap and mask, sterile gown, sterile gloves, large sterile sheet, and Chlorhexidine as cutaneous antisepsis. The region was infiltrated locally with 1% lidocaine. Under real-time ultrasound guidance, the right IJ vein was accessed  with a 21 gauge micropuncture needle; the needle tip within the vein was confirmed with ultrasound image documentation. 78F single cuffed powerPICC tunneled from a right anterior chest wall approach to the dermatotomy site. Needle exchanged over the 018 guidewire for transitional dilator, through which the catheter which had been cut to 25 cm was advanced under intermittent fluoroscopy, positioned with its tip at the cavoatrial junction. Spot chest radiograph confirms good catheter position. No pneumothorax. Catheter was flushed per protocol. Catheter secured externally with O Prolene suture. The right IJ dermatotomy site was closed with Dermabond. COMPLICATIONS: COMPLICATIONS None immediate FLUOROSCOPY TIME:  0.2 minute; 380 uGym2 DAP COMPARISON:  None IMPRESSION: 1. Technically successful placement of tunneled right IJ tunneled single power injectable catheter with ultrasound and fluoroscopic guidance. Ready for routine use. Electronically Signed   By: Corlis Leak M.D.   On: 06/23/2019 13:13    Scheduled Meds: . atorvastatin  10 mg Oral Daily  . carvedilol  3.125 mg Oral BID WC  . Chlorhexidine  Gluconate Cloth  6 each Topical Daily  . dextromethorphan-guaiFENesin  1 tablet Oral BID  . digoxin  0.125 mg Oral Daily  . enoxaparin (LOVENOX) injection  0.5 mg/kg Subcutaneous Q24H  . insulin aspart  0-15 Units Subcutaneous TID WC  . insulin aspart  0-5 Units Subcutaneous QHS  . insulin glargine  5 Units Subcutaneous Daily  . ivabradine  5 mg Oral BID WC  . magnesium oxide  400 mg Oral BID  . potassium chloride  40 mEq Oral BID  . sacubitril-valsartan  1 tablet Oral BID  . sodium chloride flush  3 mL Intravenous Q12H  . sodium chloride flush  3 mL Intravenous Q12H  . spironolactone  25 mg Oral Daily  . torsemide  60 mg Oral q1800  . torsemide  80 mg Oral QAC breakfast   Continuous Infusions: . sodium chloride Stopped (06/20/19 1140)  . milrinone 0.25 mcg/kg/min (06/24/19 0803)     LOS: 7 days   Rickey Barbara, MD [facility] Pager On Amion  If 7PM-7AM, please contact night-coverage 06/24/2019, 2:31 PM

## 2019-06-24 NOTE — Progress Notes (Signed)
Physical Therapy Treatment Patient Details Name: Kirsten Wheeler MRN: [id number] DOB: [birth year] Today's Date: 06/24/2019    History of Present Illness Pt is a 38 y/o female admitted secondary to worsening SOB. Thought to be secondary to CHF exacerbation. s/p cardiac cath 10/5. PMH includes obesity, asthma, s/p ICD, DM, CHF, and OSA on CPAP.    PT Comments    Patient is progressing well toward PT goals and tolerated gait/stair training well. Current plan remains appropriate.    Follow Up Recommendations  No PT follow up     Equipment Recommendations  None recommended by PT    Recommendations for Other Services       Precautions / Restrictions Precautions Precautions: Other (comment) Precaution Comments: watch BP Restrictions Weight Bearing Restrictions: No    Mobility  Bed Mobility Overal bed mobility: Independent                Transfers Overall transfer level: Independent Equipment used: None                Ambulation/Gait Ambulation/Gait assistance: Supervision Gait Distance (Feet): 300 Feet Assistive device: None Gait Pattern/deviations: Step-through pattern;Decreased stride length;Wide base of support Gait velocity: Decreased   General Gait Details: lateral sway due to body habitus; decreased cadence but does demonstrate ability to increased gait speed   Stairs Stairs: Yes Stairs assistance: Supervision Stair Management: Two rails;Step to pattern;Forwards Number of Stairs: (8 steps X 2) General stair comments: HR up to 123 bpm with stair negotiation and SpO2 96% on RA   Wheelchair Mobility    Modified Rankin (Stroke Patients Only)       Balance Overall balance assessment: No apparent balance deficits (not formally assessed) Sitting-balance support: No upper extremity supported;Feet supported Sitting balance-Leahy Scale: Good     Standing balance support: No upper extremity supported;During functional activity Standing balance-Leahy  Scale: Fair                              Cognition Arousal/Alertness: Awake/alert Behavior During Therapy: WFL for tasks assessed/performed Overall Cognitive Status: Within Functional Limits for tasks assessed                                        Exercises      General Comments        Pertinent Vitals/Pain Pain Assessment: No/denies pain    Home Living                      Prior Function            PT Goals (current goals can now be found in the care plan section) Acute Rehab PT Goals Patient Stated Goal: to go home Progress towards PT goals: Progressing toward goals    Frequency    Min 3X/week      PT Plan Current plan remains appropriate    Co-evaluation              AM-PAC PT "6 Clicks" Mobility   Outcome Measure  Help needed turning from your back to your side while in a flat bed without using bedrails?: None Help needed moving from lying on your back to sitting on the side of a flat bed without using bedrails?: None Help needed moving to and from a bed to a chair (including a wheelchair)?: None Help needed  standing up from a chair using your arms (e.g., wheelchair or bedside chair)?: None Help needed to walk in hospital room?: None Help needed climbing 3-5 steps with a railing? : A Little 6 Click Score: 23    End of Session   Activity Tolerance: Patient tolerated treatment well Patient left: in bed;with call bell/phone within reach Nurse Communication: Mobility status PT Visit Diagnosis: Difficulty in walking, not elsewhere classified (R26.2)     Time: 6168-3729 PT Time Calculation (min) (ACUTE ONLY): 23 min  Charges:  $Gait Training: 23-37 mins                     Earney Navy, PTA Acute Rehabilitation Services Pager: [phone] Office: [phone]     Darliss Cheney 06/24/2019, 4:28 PM

## 2019-06-24 NOTE — TOC Transition Note (Signed)
Transition of Care [facility]) - CM/SW Discharge Note   Patient Details  Name: Kirsten Wheeler MRN: [id number] Date of Birth: 10-15-1980  [facility]) CM/SW Contact:  Zenon Mayo, RN Phone Number: 06/24/2019, 2:58 PM   Clinical Narrative:    Patient is for discharge tomorrow home on milrinone, she is set up with [facility]  Carolynn Sayers with [employer] is supplying the milrinone medication and the pump and the tubing.  [name] with HF clinic will be looking into LOG at the time when needed for the equipment.  Patient will also need tub/shower seat. NCM made referral to [place] with [employer]  , he will deliver to patient room today.    Final next level of care: [place] Barriers to Discharge: No Barriers Identified   Patient Goals and CMS Choice Patient states their goals for this hospitalization and ongoing recovery are:: get better CMS Medicare.gov Compare Post Acute Care list provided to:: Patient Choice offered to / list presented to : Patient  Discharge Placement                       Discharge Plan and Services                DME Arranged: IV pump/equipment DME Agency: ([employer] infusion) Date DME Agency Contacted: 06/24/19 Time DME Agency Contacted: [id number] Representative spoke with at DME Agency: [place]: RN, Disease Management [place] Agency: [place] Date [place]: 06/23/19 Time [place]: 1010 Representative spoke with at [place]: [place] ([place]) Interventions     Readmission Risk Interventions No flowsheet data found.

## 2019-06-24 NOTE — Progress Notes (Signed)
CARDIAC REHAB PHASE I   PRE:  Rate/Rhythm: 98 SR    BP: lying 78/68 right lower leg    SaO2: 95 RA  MODE:  Ambulation: 410 ft   POST:  Rate/Rhythm: 112 ST    BP:      SaO2: 96 RA  Tolerated walk fairly well. BP low in leg before walk. Cuff causing her significant pain and therefore I did not take after walk. Discussed sodium restrictions and trying to lose weight. She sts she watching her diet and sodium. She almost always cooks at home, vegetables and chicken, etc. She has recently become interested in vegan. Encouraged her to keep a food journal or use My Fitness Pal to see her sodium and calorie content however she sts this would be stressful. She sts she tried the Healthy Weight program but they were not gracious to tardiness and she did not have a car. I encouraged walking and increasing her steps daily. We discussed CRPII and I will refer her but she might be limited due to milrinone. She understands HF zones and daily wts. [id number]   [place], ACSM 06/24/2019 11:08 AM

## 2019-06-24 NOTE — Progress Notes (Signed)
CSW following patient through [facility].  Informed by [employer] that pt milrinone would be covered for home but that the necessary supplies (pump and tubing) would not be and that it would cost $35/day which is not affordable for the patient.  CSW called Medicaid who states that the home agency would need to submit a PA for the equipment and once they get a denial we could submit an appeal along with Certified Medical Necessity statement from the MD as to why the milrinone is necessary.  CSW updated [employer]- they will look into submitting a PA for the equipment coverage.  CSW also researched potential grants to assist with cost- only grants available are for milrinone medication but not for the equipment necessary to administer.   CSW continuing to follow and assist as needed  Jorge Ny, [facility] Desk#: [phone] Cell#: [phone]

## 2019-06-24 NOTE — Progress Notes (Signed)
[name], female   DOB: Jul 29, 1981, 38 y.o.   MRN: [id number]     Advanced Heart Failure Rounding Note  PCP-Cardiologist: No primary care provider on file.   Subjective:    Stable on 0.25 milrinone, CVP 9 and co-ox 68%.  Weight down 1.5 lbs now back on po diuretic.  SBP 90s-100s, no lightheadedness.  CXR: Vascular congestion, no PNA.   RHC Procedural Findings (10/5): Hemodynamics (mmHg) RA mean 7 RV 49/10 PA 65/25 mean 45 PCWP mean 27 Oxygen saturations: PA 57% AO 100% Cardiac Output (Fick) 4.65  Cardiac Index (Fick) 1.82 PVR 3.87 WU PAPI 5.7  Objective:   Weight Range: (!) 161.9 kg Body mass index is 59.4 kg/m.   Vital Signs:   Temp:  [97.5 F (36.4 C)-98.2 F (36.8 C)] 98 F (36.7 C) (10/08 0726) Pulse Rate:  [54-100] 54 (10/08 0726) Resp:  [9-32] 19 (10/08 0726) BP: (84-126)/(49-82) 126/80 (10/08 0726) SpO2:  [96 %-100 %] 98 % (10/08 0726) Weight:  [161.9 kg] 161.9 kg (10/08 0350) Last BM Date: 06/23/19  Weight change: Filed Weights   06/22/19 0347 06/23/19 0500 06/24/19 0350  Weight: (!) 163.4 kg (!) 162.5 kg (!) 161.9 kg    Intake/Output:   Intake/Output Summary (Last 24 hours) at 06/24/2019 0758 Last data filed at 06/24/2019 0726 Gross per 24 hour  Intake 557.81 ml  Output 400 ml  Net 157.81 ml      Physical Exam    General: NAD Neck: No JVD, no thyromegaly or thyroid nodule.  Lungs: Clear to auscultation bilaterally with normal respiratory effort. CV: Nonpalpable PMI.  Heart mildly tachy, regular S1/S2, no S3/S4, no murmur.  No peripheral edema.   Abdomen: Soft, nontender, no hepatosplenomegaly, no distention.  Skin: Intact without lesions or rashes.  Neurologic: Alert and oriented x 3.  Psych: Normal affect. Extremities: No clubbing or cyanosis.  HEENT: Normal.    Telemetry   NSR 100s, personally reviewed.   Labs    CBC Recent Labs    06/21/19 0759 06/22/19 0530  WBC  --  9.2  HGB 14.6  14.6 14.7  HCT 43.0   43.0 42.5  MCV  --  79.1*  PLT  --  337   Basic Metabolic Panel Recent Labs    90/30/09 0530  06/23/19 0224 06/24/19 0408  NA 134*   < > 133* 132*  K 3.6   < > 3.6 3.6  CL 92*   < > 90* 91*  CO2 28   < > 29 28  GLUCOSE 192*   < > 175* 305*  BUN 27*   < > 31* 25*  CREATININE 1.22*   < > 1.19* 1.01*  CALCIUM 10.1   < > 10.8* 10.0  MG 1.5*  --  2.0  --    < > = values in this interval not displayed.   Liver Function Tests Recent Labs    06/22/19 0530  AST 29  ALT 28  ALKPHOS 50  BILITOT 1.1  PROT 7.6  ALBUMIN 3.3*   No results for input(s): LIPASE, AMYLASE in the last 72 hours. Cardiac Enzymes No results for input(s): CKTOTAL, CKMB, CKMBINDEX, TROPONINI in the last 72 hours.  BNP: BNP (last 3 results) Recent Labs    08/25/18 1020 06/17/19 1957  BNP 126.8* 414.8*    ProBNP (last 3 results) No results for input(s): PROBNP in the last 8760 hours.   D-Dimer No results for input(s): DDIMER in the last 72 hours. Hemoglobin  A1C No results for input(s): HGBA1C in the last 72 hours. Fasting Lipid Panel No results for input(s): CHOL, HDL, LDLCALC, TRIG, CHOLHDL, LDLDIRECT in the last 72 hours. Thyroid Function Tests No results for input(s): TSH, T4TOTAL, T3FREE, THYROIDAB in the last 72 hours.  Invalid input(s): FREET3  Other results:   Imaging    Ir Fluoro Guide Cv Line Right  Result Date: 06/23/2019 CLINICAL DATA:  Heart failure, needs durable venous access for IV mGy known EXAM: TUNNELED CENTRAL VENOUS CATHETER PLACEMENT WITH ULTRASOUND AND FLUOROSCOPIC GUIDANCE TECHNIQUE: The procedure, risks, benefits, and alternatives were explained to the patient. Questions regarding the procedure were encouraged and answered. The patient understands and consents to the procedure. Patency of the right IJ vein was confirmed with ultrasound with image documentation. An appropriate skin site was determined. Region was prepped using maximum barrier technique including cap and  mask, sterile gown, sterile gloves, large sterile sheet, and Chlorhexidine as cutaneous antisepsis. The region was infiltrated locally with 1% lidocaine. Under real-time ultrasound guidance, the right IJ vein was accessed with a 21 gauge micropuncture needle; the needle tip within the vein was confirmed with ultrasound image documentation. 748F single cuffed powerPICC tunneled from a right anterior chest wall approach to the dermatotomy site. Needle exchanged over the 018 guidewire for transitional dilator, through which the catheter which had been cut to 25 cm was advanced under intermittent fluoroscopy, positioned with its tip at the cavoatrial junction. Spot chest radiograph confirms good catheter position. No pneumothorax. Catheter was flushed per protocol. Catheter secured externally with O Prolene suture. The right IJ dermatotomy site was closed with Dermabond. COMPLICATIONS: COMPLICATIONS None immediate FLUOROSCOPY TIME:  0.2 minute; 380 uGym2 DAP COMPARISON:  None IMPRESSION: 1. Technically successful placement of tunneled right IJ tunneled single power injectable catheter with ultrasound and fluoroscopic guidance. Ready for routine use. Electronically Signed   By: Corlis Leak M.D.   On: 06/23/2019 13:13   Ir US Guide Vasc Access Right  Result Date: 06/23/2019 CLINICAL DATA:  Heart failure, needs durable venous access for IV mGy known EXAM: TUNNELED CENTRAL VENOUS CATHETER PLACEMENT WITH ULTRASOUND AND FLUOROSCOPIC GUIDANCE TECHNIQUE: The procedure, risks, benefits, and alternatives were explained to the patient. Questions regarding the procedure were encouraged and answered. The patient understands and consents to the procedure. Patency of the right IJ vein was confirmed with ultrasound with image documentation. An appropriate skin site was determined. Region was prepped using maximum barrier technique including cap and mask, sterile gown, sterile gloves, large sterile sheet, and Chlorhexidine as cutaneous  antisepsis. The region was infiltrated locally with 1% lidocaine. Under real-time ultrasound guidance, the right IJ vein was accessed with a 21 gauge micropuncture needle; the needle tip within the vein was confirmed with ultrasound image documentation. 748F single cuffed powerPICC tunneled from a right anterior chest wall approach to the dermatotomy site. Needle exchanged over the 018 guidewire for transitional dilator, through which the catheter which had been cut to 25 cm was advanced under intermittent fluoroscopy, positioned with its tip at the cavoatrial junction. Spot chest radiograph confirms good catheter position. No pneumothorax. Catheter was flushed per protocol. Catheter secured externally with O Prolene suture. The right IJ dermatotomy site was closed with Dermabond. COMPLICATIONS: COMPLICATIONS None immediate FLUOROSCOPY TIME:  0.2 minute; 380 uGym2 DAP COMPARISON:  None IMPRESSION: 1. Technically successful placement of tunneled right IJ tunneled single power injectable catheter with ultrasound and fluoroscopic guidance. Ready for routine use. Electronically Signed   By: Ronald Pippins.D.  On: 06/23/2019 13:13     Medications:     Scheduled Medications: . atorvastatin  10 mg Oral Daily  . carvedilol  3.125 mg Oral BID WC  . Chlorhexidine Gluconate Cloth  6 each Topical Daily  . dextromethorphan-guaiFENesin  1 tablet Oral BID  . digoxin  0.125 mg Oral Daily  . enoxaparin (LOVENOX) injection  0.5 mg/kg Subcutaneous Q24H  . insulin aspart  0-15 Units Subcutaneous TID WC  . insulin aspart  0-5 Units Subcutaneous QHS  . insulin glargine  5 Units Subcutaneous Daily  . ipratropium-albuterol  3 mL Nebulization Q6H  . ivabradine  5 mg Oral BID WC  . magnesium oxide  400 mg Oral BID  . potassium chloride  40 mEq Oral BID  . sacubitril-valsartan  1 tablet Oral BID  . sodium chloride flush  3 mL Intravenous Q12H  . sodium chloride flush  3 mL Intravenous Q12H  . spironolactone  25 mg Oral  Daily  . torsemide  60 mg Oral q1800  . torsemide  80 mg Oral QAC breakfast    Infusions: . sodium chloride Stopped (06/20/19 1140)  . 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 Admits to highlevels of water and gatorade intake.   Assessment/Plan   1. Acute on Chronic Systolic HF: Echo this admission with EF < 20%, relatively normal RV, moderate MR. NICM with [employer] ICD. Acute exacerbation in the setting of dietary indiscretion w/ sodium. Pt believes she is ~20 lb above dry weight. She has now lost 17 lbs with diuresis.  RHC 10/5 with low output (CI 1.8) and elevated PCWP but near normal RA pressure.  Minimal JVP on exam today and no edema. I started her on milrinone 0.25 with low output. We have had to cut back on meds due to hypotension. Today, CVP 9 with co-ox 68%.   - Continue torsemide 80 qam/60 qpm today.  - Continue Coreg 3.125 mg bid.  - Continue Entresto 24/26 bid.  - I have added ivabradine 5 mg bid.  - Continue spironolactone 25 daily.  - Continue digoxin 0.125 mg - Continue milrinone 0.25 for home.  Working on [employer] account plannergetting insurance coverage for home infusion.   - Patient has low output HF, had low output on prior RHC as well.  CPX with probable severe functional impairment.  I think we are at the point where we need to think about LVAD.  Her size and social support will be issues.  She would not be a transplant candidate at this point due to weight. [facility] Dr. Donata ClayVan Trigt has seen.  She will need significant weight loss to  get to LVAD.  Will use home milrinone to try to facilitate increased activity to work towards weight loss.  She understands that milrinone is not a good end-point and that we would like to use it as a bridge to eventual LVAD. RV function seems adequate for LVAD.  2. OSA: Has been refusing CPAP here as she does not like the mask here.  3. Morbid obesity: Continue to work on weight loss.  This will  be imperative as she cannot get to LVAD without weight loss.  - Will ask our social worker to reach out to [facility] again.  4. Asthma: Wheezy on exam with history of asthma.  No PNA on CXR.  Possibly more due to pulmonary edema. Improved.  - [facility] following, steroids stopped.   I think she can go home today.  Will need home health for IV milrinone.  Meds for discharge: milrinone 0.25, ivabradine 5 mg bid, Coreg 3.125 mg bid, digoxin 0.125, spironolactone 25 daily, Entresto 24/26 bid, KCl 40 bid, atorvastatin 10 daily.    Length of Stay: 7  Loralie Champagne, MD  06/24/2019, 7:58 AM  Advanced Heart Failure Team Pager [phone] (M-F; 7a - 4p)  Please contact [facility] for night-coverage after hours (4p -7a ) and weekends on [email or web address]

## 2019-06-24 NOTE — Progress Notes (Signed)
Pt refuses CPAP she has RT remove it from her room yesterday.

## 2019-06-24 NOTE — Progress Notes (Signed)
CSW completed Prior Auth form for Medicaid for milrinone pump and supplies- fax confirmation received- awaiting determination from Medicaid- anticipate response within 24-48 hours.  CSW spoke with Nathaniel Man- assistant director with inpatient [facility]- and he is willing to do an LOG with [facility] for home supplies once Medicaid has denied and an appeal has been done to cover supplies.  CSW spoke with [facility] and they are willing to accept patient while we work on [place] prior [place] with understanding that LOG will be approved if Medicaid denies milrinone supplies.  CSW will continue to follow and assist as needed  Jorge Ny, [facility] Desk#: [phone] Cell#: [phone]

## 2019-06-24 NOTE — Progress Notes (Addendum)
Inpatient Diabetes Program Recommendations  AACE/ADA: New Consensus Statement on Inpatient Glycemic Control   Target Ranges:  Prepandial:   less than 140 mg/dL      Peak postprandial:   less than 180 mg/dL (1-2 hours)      Critically ill patients:  140 - 180 mg/dL   Results for Kirsten Wheeler, Kirsten Wheeler (MRN [id number]) as of 06/24/2019 10:46  Ref. Range 06/23/2019 06:13 06/23/2019 11:21 06/23/2019 15:57 06/23/2019 20:45 06/24/2019 06:32  Glucose-Capillary Latest Ref Range: 70 - 99 mg/dL 163 (H) 269 (H) 188 (H) 216 (H) 174 (H)   Review of Glycemic Control  Current orders for Inpatient glycemic control: Lantus 5 units daily, Novolog 0-15 units TID with meals, Novolog 0-5 units QHS  Inpatient Diabetes Program Recommendations:    Insulin-Meal Coverage: Please consider ordering Novolog 3 units TID with meals for meal coverage if patient eats at least 50% of meals.  Addendum 06/24/19@11 :30-Received page from Dr. Wyline Copas regarding discharge recommendations for DM management; may consider discharging on Glipizide 5 mg daily. Spoke with patient over the phone. Patient states that she has not been taking Metformin 500 mg BID. She took the Metformin for about 2-3 months after it was prescribed but she stopped taking it due to excessive diarrhea.  Discussed importance of taking DM medications and keeping provider informed about issues she may be having so they can make changes if needed. Discussed Glipizide and how it works for DM control. Discussed risk of hypoglycemia with Glipizide. Patient states she has never experienced hypoglycemia that she is aware of. Discussed hypoglycemia, signs and symptoms, and proper treatment.  Patient states that she would be willing to try other oral DM medications but she does not want to resume Metformin. Patient states that she has all needed supplies for glucose monitoring at home. Encouraged patient to monitor glucose as directed by MD and to follow up with PCP regarding DM control so  additional adjustments can be made with DM medications if needed.  Thanks, Barnie Alderman, RN, MSN, CDE Diabetes Coordinator Inpatient Diabetes Program [phone] (Team Pager from 8am to 5pm)

## 2019-06-24 NOTE — Progress Notes (Signed)
RT Note:  Patient refused CPAP at this time. Patient aware to call for Respiratory if she would like to wear CPAP.

## 2019-06-25 ENCOUNTER — Telehealth (HOSPITAL_COMMUNITY): Payer: Self-pay | Admitting: Pharmacist

## 2019-06-25 LAB — CBC
HCT: 43 % (ref 36.0–46.0)
Hemoglobin: 14.9 g/dL (ref 12.0–15.0)
MCH: 27.6 pg (ref 26.0–34.0)
MCHC: 34.7 g/dL (ref 30.0–36.0)
MCV: 79.6 fL — ABNORMAL LOW (ref 80.0–100.0)
Platelets: 333 10*3/uL (ref 150–400)
RBC: 5.4 MIL/uL — ABNORMAL HIGH (ref 3.87–5.11)
RDW: 13.4 % (ref 11.5–15.5)
WBC: 11.7 10*3/uL — ABNORMAL HIGH (ref 4.0–10.5)
nRBC: 0 % (ref 0.0–0.2)

## 2019-06-25 LAB — GLUCOSE, CAPILLARY
Glucose-Capillary: 239 mg/dL — ABNORMAL HIGH (ref 70–99)
Glucose-Capillary: 195 mg/dL — ABNORMAL HIGH (ref 70–99)
Glucose-Capillary: 170 mg/dL — ABNORMAL HIGH (ref 70–99)

## 2019-06-25 LAB — BASIC METABOLIC PANEL
Anion gap: 12 (ref 5–15)
BUN: 24 mg/dL — ABNORMAL HIGH (ref 6–20)
CO2: 28 mmol/L (ref 22–32)
Calcium: 9.5 mg/dL (ref 8.9–10.3)
Chloride: 92 mmol/L — ABNORMAL LOW (ref 98–111)
Creatinine, Ser: 0.97 mg/dL (ref 0.44–1.00)
GFR calc Af Amer: >60 mL/min (ref >60)
GFR calc non Af Amer: >60 mL/min (ref >60)
Glucose, Bld: 286 mg/dL — ABNORMAL HIGH (ref 70–99)
Potassium: 3.8 mmol/L (ref 3.5–5.1)
Sodium: 132 mmol/L — ABNORMAL LOW (ref 135–145)

## 2019-06-25 LAB — COOXEMETRY PANEL
Carboxyhemoglobin: 1.2 % (ref 0.5–1.5)
Methemoglobin: 0.6 % (ref 0.0–1.5)
O2 Saturation: 58.8 %
Total hemoglobin: 14.6 g/dL (ref 12.0–16.0)

## 2019-06-25 MED ORDER — SPIRONOLACTONE 25 MG PO TABS: 25.0000 mg | ORAL_TABLET | Freq: Every day | ORAL | 3 refills | Status: DC

## 2019-06-25 MED ORDER — TORSEMIDE 20 MG PO TABS: ORAL_TABLET | ORAL | 2 refills | Status: DC

## 2019-06-25 MED ORDER — DIGOXIN 125 MCG PO TABS: 0.1250 mg | ORAL_TABLET | Freq: Every day | ORAL | 3 refills | Status: DC

## 2019-06-25 MED ORDER — CARVEDILOL 3.125 MG PO TABS: 3.1250 mg | ORAL_TABLET | Freq: Two times a day (BID) | ORAL | 5 refills | Status: DC

## 2019-06-25 MED ORDER — SACUBITRIL-VALSARTAN 24-26 MG PO TABS: 1.0000 | ORAL_TABLET | Freq: Two times a day (BID) | ORAL | 5 refills | Status: DC

## 2019-06-25 MED ORDER — IVABRADINE HCL 5 MG PO TABS: 5.0000 mg | ORAL_TABLET | Freq: Two times a day (BID) | ORAL | 5 refills | Status: DC

## 2019-06-25 MED ORDER — MILRINONE LACTATE IN DEXTROSE 20-5 MG/100ML-% IV SOLN: 0.2500 ug/kg/min | INTRAVENOUS | Status: DC

## 2019-06-25 MED FILL — SPIRONOLACTONE 25 MG TABLET: 25 | 90 days supply | Qty: 90 | Fill #0

## 2019-06-25 MED FILL — ENTRESTO 24 MG-26 MG TABLET: 24-26 | 30 days supply | Qty: 60 | Fill #0

## 2019-06-25 MED FILL — TORSEMIDE 20 MG TABLET: 20 | 30 days supply | Qty: 210 | Fill #0

## 2019-06-25 MED FILL — DIGOXIN 0.125 MG TABLET: 125 | 30 days supply | Qty: 30 | Fill #0

## 2019-06-25 MED FILL — CORLANOR 5 MG TABLET: 5 | 30 days supply | Qty: 60 | Fill #0

## 2019-06-25 MED FILL — CARVEDILOL 3.125 MG TABLET: 3.125 | 30 days supply | Qty: 60 | Fill #0

## 2019-06-25 NOTE — Progress Notes (Signed)
Pt discharged home with friend, w/c to ED entrance

## 2019-06-25 NOTE — Plan of Care (Signed)
  Problem: Education: Goal: Knowledge of General Education information will improve Description: Including pain rating scale, medication(s)/side effects and non-pharmacologic comfort measures 06/25/2019 1156 by Shanon Ace, RN Outcome: Adequate for Discharge 06/25/2019 0759 by Shanon Ace, RN Outcome: Progressing   Problem: Health Behavior/Discharge Planning: Goal: Ability to manage health-related needs will improve 06/25/2019 1156 by Shanon Ace, RN Outcome: Adequate for Discharge 06/25/2019 0759 by Shanon Ace, RN Outcome: Progressing   Problem: Clinical Measurements: Goal: Ability to maintain clinical measurements within normal limits will improve 06/25/2019 1156 by Shanon Ace, RN Outcome: Adequate for Discharge 06/25/2019 0759 by Shanon Ace, RN Outcome: Progressing Goal: Will remain free from infection 06/25/2019 1156 by Shanon Ace, RN Outcome: Adequate for Discharge 06/25/2019 0759 by Shanon Ace, RN Outcome: Progressing Goal: Diagnostic test results will improve 06/25/2019 1156 by Shanon Ace, RN Outcome: Adequate for Discharge 06/25/2019 0759 by Shanon Ace, RN Outcome: Progressing Goal: Respiratory complications will improve 06/25/2019 1156 by Shanon Ace, RN Outcome: Adequate for Discharge 06/25/2019 0759 by Shanon Ace, RN Outcome: Progressing Goal: Cardiovascular complication will be avoided 06/25/2019 1156 by Shanon Ace, RN Outcome: Adequate for Discharge 06/25/2019 0759 by Shanon Ace, RN Outcome: Progressing   Problem: Activity: Goal: Risk for activity intolerance will decrease 06/25/2019 1156 by Shanon Ace, RN Outcome: Adequate for Discharge 06/25/2019 0759 by Shanon Ace, RN Outcome: Progressing   Problem: Nutrition: Goal: Adequate nutrition will be maintained 06/25/2019 1156 by Shanon Ace, RN Outcome: Adequate for Discharge 06/25/2019 0759 by Shanon Ace, RN Outcome: Progressing   Problem: Coping: Goal: Level of anxiety  will decrease 06/25/2019 1156 by Shanon Ace, RN Outcome: Adequate for Discharge 06/25/2019 0759 by Shanon Ace, RN Outcome: Progressing   Problem: Elimination: Goal: Will not experience complications related to bowel motility 06/25/2019 1156 by Shanon Ace, RN Outcome: Adequate for Discharge 06/25/2019 0759 by Shanon Ace, RN Outcome: Progressing Goal: Will not experience complications related to urinary retention 06/25/2019 1156 by Shanon Ace, RN Outcome: Adequate for Discharge 06/25/2019 0759 by Shanon Ace, RN Outcome: Progressing

## 2019-06-25 NOTE — Plan of Care (Signed)
  Problem: Education: Goal: Knowledge of General Education information will improve Description: Including pain rating scale, medication(s)/side effects and non-pharmacologic comfort measures 06/25/2019 1157 by Shanon Ace, RN Outcome: Adequate for Discharge 06/25/2019 1156 by Shanon Ace, RN Outcome: Adequate for Discharge 06/25/2019 0759 by Shanon Ace, RN Outcome: Progressing   Problem: Health Behavior/Discharge Planning: Goal: Ability to manage health-related needs will improve 06/25/2019 1157 by Shanon Ace, RN Outcome: Adequate for Discharge 06/25/2019 1156 by Shanon Ace, RN Outcome: Adequate for Discharge 06/25/2019 0759 by Shanon Ace, RN Outcome: Progressing   Problem: Clinical Measurements: Goal: Ability to maintain clinical measurements within normal limits will improve 06/25/2019 1157 by Shanon Ace, RN Outcome: Adequate for Discharge 06/25/2019 1156 by Shanon Ace, RN Outcome: Adequate for Discharge 06/25/2019 0759 by Shanon Ace, RN Outcome: Progressing Goal: Will remain free from infection 06/25/2019 1157 by Shanon Ace, RN Outcome: Adequate for Discharge 06/25/2019 1156 by Shanon Ace, RN Outcome: Adequate for Discharge 06/25/2019 0759 by Shanon Ace, RN Outcome: Progressing Goal: Diagnostic test results will improve 06/25/2019 1157 by Shanon Ace, RN Outcome: Adequate for Discharge 06/25/2019 1156 by Shanon Ace, RN Outcome: Adequate for Discharge 06/25/2019 0759 by Shanon Ace, RN Outcome: Progressing Goal: Respiratory complications will improve 06/25/2019 1157 by Shanon Ace, RN Outcome: Adequate for Discharge 06/25/2019 1156 by Shanon Ace, RN Outcome: Adequate for Discharge 06/25/2019 0759 by Shanon Ace, RN Outcome: Progressing Goal: Cardiovascular complication will be avoided 06/25/2019 1157 by Shanon Ace, RN Outcome: Adequate for Discharge 06/25/2019 1156 by Shanon Ace, RN Outcome: Adequate for Discharge 06/25/2019  0759 by Shanon Ace, RN Outcome: Progressing   Problem: Activity: Goal: Risk for activity intolerance will decrease 06/25/2019 1157 by Shanon Ace, RN Outcome: Adequate for Discharge 06/25/2019 1156 by Shanon Ace, RN Outcome: Adequate for Discharge 06/25/2019 0759 by Shanon Ace, RN Outcome: Progressing   Problem: Nutrition: Goal: Adequate nutrition will be maintained 06/25/2019 1157 by Shanon Ace, RN Outcome: Adequate for Discharge 06/25/2019 1156 by Shanon Ace, RN Outcome: Adequate for Discharge 06/25/2019 0759 by Shanon Ace, RN Outcome: Progressing   Problem: Coping: Goal: Level of anxiety will decrease 06/25/2019 1157 by Shanon Ace, RN Outcome: Adequate for Discharge 06/25/2019 1156 by Shanon Ace, RN Outcome: Adequate for Discharge 06/25/2019 0759 by Shanon Ace, RN Outcome: Progressing   Problem: Elimination: Goal: Will not experience complications related to bowel motility 06/25/2019 1157 by Shanon Ace, RN Outcome: Adequate for Discharge 06/25/2019 1156 by Shanon Ace, RN Outcome: Adequate for Discharge 06/25/2019 0759 by Shanon Ace, RN Outcome: Progressing Goal: Will not experience complications related to urinary retention 06/25/2019 1157 by Shanon Ace, RN Outcome: Adequate for Discharge 06/25/2019 1156 by Shanon Ace, RN Outcome: Adequate for Discharge 06/25/2019 0759 by Shanon Ace, RN Outcome: Progressing   Problem: Acute Rehab PT Goals(only PT should resolve) Goal: Patient Will Transfer Sit To/From Stand Outcome: Adequate for Discharge Goal: Pt Will Ambulate Outcome: Adequate for Discharge Goal: Pt Will Go Up/Down Stairs Outcome: Adequate for Discharge   Problem: Food- and Nutrition-Related Knowledge Deficit (NB-1.1) Goal: Nutrition education Description: Formal process to instruct or train a patient/client in a skill or to impart knowledge to help patients/clients voluntarily manage or modify food choices and eating  behavior to maintain or improve health. Outcome: Adequate for Discharge

## 2019-06-25 NOTE — Plan of Care (Signed)

## 2019-06-25 NOTE — Telephone Encounter (Signed)
Advanced Heart Failure Patient Advocate Encounter  Prior Authorization for Kirsten Wheeler has been approved.    PA# [id number] Confirmation #:[id number] W Effective dates: 06/25/2019 - 06/24/2020  Patients co-pay is $3.00 ([place] Medicaid)  [name], PharmD, BCPS, CPP Heart Failure Clinic Pharmacist [phone]

## 2019-06-25 NOTE — Progress Notes (Addendum)
[name], female   DOB: 11/23/1980, 38 y.o.   MRN: [id number]     Advanced Heart Failure Rounding Note  PCP-Cardiologist: Dr. Gala Romney   Subjective:    Stable on 0.25 milrinone, CVP 7 and co-ox 59%.  MAPs 75-79. One 5 beat run of NSVT.   Feels significantly better. No dyspnea. No lightheadedness or dizziness.   RHC Procedural Findings (10/5): Hemodynamics (mmHg) RA mean 7 RV 49/10 PA 65/25 mean 45 PCWP mean 27 Oxygen saturations: PA 57% AO 100% Cardiac Output (Fick) 4.65  Cardiac Index (Fick) 1.82 PVR 3.87 WU PAPI 5.7  Objective:   Weight Range: (!) 162.1 kg Body mass index is 59.47 kg/m.   Vital Signs:   Temp:  [97.7 F (36.5 C)-98.2 F (36.8 C)] 97.7 F (36.5 C) (10/09 0732) Pulse Rate:  [80-109] 95 (10/09 0341) Resp:  [16-24] 21 (10/09 0341) BP: (86-107)/(59-79) 105/66 (10/09 0745) SpO2:  [98 %-99 %] 99 % (10/09 0341) Weight:  [162.1 kg] 162.1 kg (10/09 0341) Last BM Date: 06/23/19  Weight change: Filed Weights   06/23/19 0500 06/24/19 0350 06/25/19 0341  Weight: (!) 162.5 kg (!) 161.9 kg (!) 162.1 kg    Intake/Output:   Intake/Output Summary (Last 24 hours) at 06/25/2019 0748 Last data filed at 06/25/2019 0400 Gross per 24 hour  Intake 969.31 ml  Output 1450 ml  Net -480.69 ml      Physical Exam    PHYSICAL EXAM: General:  Super morbidly obese, young AAF. No respiratory difficulty HEENT: normal Neck: thick neck. no JVD. Carotids 2+ bilat; no bruits. No lymphadenopathy or thyromegaly appreciated. Cor: PMI nondisplaced. Regular rate & rhythm. No rubs, gallops or murmurs. Lungs: clear Abdomen: obese, soft, nontender, nondistended. No hepatosplenomegaly. No bruits or masses. Good bowel sounds. Extremities: no cyanosis, clubbing, rash, edema Neuro: alert & oriented x 3, cranial nerves grossly intact. moves all 4 extremities w/o difficulty. Affect pleasant.   Telemetry   NSR mid 90s. On 5 beat run of NSVT   Labs     CBC Recent Labs    06/25/19 0355  WBC 11.7*  HGB 14.9  HCT 43.0  MCV 79.6*  PLT 333   Basic Metabolic Panel Recent Labs    69/62/95 0224 06/24/19 0408 06/25/19 0355  NA 133* 132* 132*  K 3.6 3.6 3.8  CL 90* 91* 92*  CO2 29 28 28   GLUCOSE 175* 305* 286*  BUN 31* 25* 24*  CREATININE 1.19* 1.01* 0.97  CALCIUM 10.8* 10.0 9.5  MG 2.0  --   --    Liver Function Tests No results for input(s): AST, ALT, ALKPHOS, BILITOT, PROT, ALBUMIN in the last 72 hours. No results for input(s): LIPASE, AMYLASE in the last 72 hours. Cardiac Enzymes No results for input(s): CKTOTAL, CKMB, CKMBINDEX, TROPONINI in the last 72 hours.  BNP: BNP (last 3 results) Recent Labs    08/25/18 1020 06/17/19 1957  BNP 126.8* 414.8*    ProBNP (last 3 results) No results for input(s): PROBNP in the last 8760 hours.   D-Dimer No results for input(s): DDIMER in the last 72 hours. Hemoglobin A1C No results for input(s): HGBA1C in the last 72 hours. Fasting Lipid Panel No results for input(s): CHOL, HDL, LDLCALC, TRIG, CHOLHDL, LDLDIRECT in the last 72 hours. Thyroid Function Tests No results for input(s): TSH, T4TOTAL, T3FREE, THYROIDAB in the last 72 hours.  Invalid input(s): FREET3  Other results:   Imaging    No results found.   Medications:  Scheduled Medications: . atorvastatin  10 mg Oral Daily  . carvedilol  3.125 mg Oral BID WC  . Chlorhexidine Gluconate Cloth  6 each Topical Daily  . dextromethorphan-guaiFENesin  1 tablet Oral BID  . digoxin  0.125 mg Oral Daily  . enoxaparin (LOVENOX) injection  0.5 mg/kg Subcutaneous Q24H  . insulin aspart  0-15 Units Subcutaneous TID WC  . insulin aspart  0-5 Units Subcutaneous QHS  . insulin glargine  5 Units Subcutaneous Daily  . ivabradine  5 mg Oral BID WC  . magnesium oxide  400 mg Oral BID  . potassium chloride  40 mEq Oral BID  . sacubitril-valsartan  1 tablet Oral BID  . sodium chloride flush  3 mL Intravenous Q12H  .  sodium chloride flush  3 mL Intravenous Q12H  . spironolactone  25 mg Oral Daily  . torsemide  60 mg Oral q1800  . torsemide  80 mg Oral QAC breakfast    Infusions: . sodium chloride Stopped (06/20/19 1140)  . 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 Admits to highlevels of water and gatorade intake.   Assessment/Plan   1. Acute on Chronic Systolic HF: Echo this admission with EF < 20%, relatively normal RV, moderate MR. NICM with [employer] ICD. Acute exacerbation in the setting of dietary indiscretion w/ sodium. Pt believes she is ~20 lb above dry weight. She has now lost 18 lbs with diuresis.  RHC 10/5 with low output (CI 1.8) and elevated PCWP but near normal RA pressure.  Started on milrinone 0.25 for low output. We have had to cut back on meds due to hypotension. Today, CVP 7 with co-ox 59%.   - Continue torsemide 80 qam/60 qpm today.  - Continue Coreg 3.125 mg bid.  - Continue Entresto 24/26 bid.  - Continue ivabradine 5 mg bid.  - Continue spironolactone 25 daily.  - Continue digoxin 0.125 mg - Continue milrinone 0.25 for home.  Working on [employer] for home infusion.   - Patient has low output HF, had low output on prior RHC as well.  CPX with probable severe functional impairment.  I think we are at the point where we need to think about LVAD.  Her size and social support will be issues.  She would not be a transplant candidate at this point due to weight. We discussed her at [facility], Dr. Prescott Gum has seen.  She will need  significant weight loss to get to LVAD.  Will use home milrinone to try to facilitate increased activity to work towards weight loss.  She understands that milrinone is not a good end-point and that we would like to use it as a bridge to eventual LVAD. RV function seems adequate for LVAD.  2. OSA: Has been refusing CPAP here as she does not like the mask here.  3. Morbid obesity: Continue to work on weight loss.  This will be imperative as she cannot get to LVAD without weight loss.  - Will ask our social worker to reach out to [facility] again.  4. Asthma:  No PNA on CXR.  Possibly more due to pulmonary edema. Improved.  - [facility] following, steroids stopped.   Plan hopeful d/c today (waiting on home milrinone setup).  Will need home health for IV milrinone.  Meds for discharge: milrinone 0.25, ivabradine 5 mg bid, Coreg 3.125 mg bid, digoxin 0.125, spironolactone 25 daily, Entresto 24/26 bid, KCl 40 bid, atorvastatin 10 daily.    Length of Stay: [address], [place]  06/25/2019, 7:48 AM  Advanced Heart Failure Team Pager [phone] (M-F; 7a - 4p)  Please contact [facility] for night-coverage after hours (4p -7a ) and weekends on [email or web address]  Patient seen with PA, agree with the above note.  She is doing well today, CVP 7 and co-ox adequate at 59%.  She is going to go home on IV milrinone today with close followup.  We need her to lose significant weight prior to being a candidate for LVAD, she is motivated to do this.  Hopefully, we can use milrinone to allow her to increase her activity level.  She is now set up for home milrinone.  She looks euvolemic on exam.  Discharge meds as above.   Marca Ancona 06/25/2019 8:15 AM

## 2019-06-27 ENCOUNTER — Emergency Department (HOSPITAL_COMMUNITY): Payer: Medicaid Other

## 2019-06-27 ENCOUNTER — Encounter (HOSPITAL_COMMUNITY): Payer: Self-pay

## 2019-06-27 ENCOUNTER — Other Ambulatory Visit: Payer: Self-pay

## 2019-06-27 ENCOUNTER — Emergency Department (HOSPITAL_COMMUNITY)
Admission: EM | Admit: 2019-06-27 | Discharge: 2019-06-28 | Disposition: A | Discharge status: Home / Self Care | Payer: Medicaid Other | Attending: Emergency Medicine | Admitting: Emergency Medicine

## 2019-06-27 DIAGNOSIS — Z7984 Long term (current) use of oral hypoglycemic drugs: Secondary | ICD-10-CM | POA: Diagnosis not present

## 2019-06-27 DIAGNOSIS — Z79899 Other long term (current) drug therapy: Secondary | ICD-10-CM | POA: Diagnosis not present

## 2019-06-27 DIAGNOSIS — R5383 Other fatigue: Secondary | ICD-10-CM | POA: Insufficient documentation

## 2019-06-27 DIAGNOSIS — R002 Palpitations: Secondary | ICD-10-CM | POA: Diagnosis not present

## 2019-06-27 DIAGNOSIS — E119 Type 2 diabetes mellitus without complications: Secondary | ICD-10-CM | POA: Diagnosis not present

## 2019-06-27 DIAGNOSIS — E669 Obesity, unspecified: Secondary | ICD-10-CM | POA: Diagnosis not present

## 2019-06-27 DIAGNOSIS — R55 Syncope and collapse: Secondary | ICD-10-CM | POA: Diagnosis not present

## 2019-06-27 DIAGNOSIS — I5032 Chronic diastolic (congestive) heart failure: Secondary | ICD-10-CM | POA: Insufficient documentation

## 2019-06-27 DIAGNOSIS — Z6841 Body Mass Index (BMI) 40.0 and over, adult: Secondary | ICD-10-CM | POA: Diagnosis not present

## 2019-06-27 LAB — BASIC METABOLIC PANEL
Anion gap: 13 (ref 5–15)
BUN: 25 mg/dL — ABNORMAL HIGH (ref 6–20)
CO2: 28 mmol/L (ref 22–32)
Calcium: 9.3 mg/dL (ref 8.9–10.3)
Chloride: 94 mmol/L — ABNORMAL LOW (ref 98–111)
Creatinine, Ser: 1.03 mg/dL — ABNORMAL HIGH (ref 0.44–1.00)
GFR calc Af Amer: >60 mL/min (ref >60)
GFR calc non Af Amer: >60 mL/min (ref >60)
Glucose, Bld: 243 mg/dL — ABNORMAL HIGH (ref 70–99)
Potassium: 3 mmol/L — ABNORMAL LOW (ref 3.5–5.1)
Sodium: 135 mmol/L (ref 135–145)

## 2019-06-27 LAB — DIGOXIN LEVEL: Digoxin Level: <0.2 ng/mL — ABNORMAL LOW (ref 0.8–2.0)

## 2019-06-27 LAB — CBC WITH DIFFERENTIAL/PLATELET
Abs Immature Granulocytes: 0.04 10*3/uL (ref 0.00–0.07)
Basophils Absolute: 0 10*3/uL (ref 0.0–0.1)
Basophils Relative: 0 %
Eosinophils Absolute: 0.1 10*3/uL (ref 0.0–0.5)
Eosinophils Relative: 1 %
HCT: 40 % (ref 36.0–46.0)
Hemoglobin: 14.1 g/dL (ref 12.0–15.0)
Immature Granulocytes: 0 %
Lymphocytes Relative: 19 %
Lymphs Abs: 1.8 10*3/uL (ref 0.7–4.0)
MCH: 27.9 pg (ref 26.0–34.0)
MCHC: 35.3 g/dL (ref 30.0–36.0)
MCV: 79.2 fL — ABNORMAL LOW (ref 80.0–100.0)
Monocytes Absolute: 0.6 10*3/uL (ref 0.1–1.0)
Monocytes Relative: 6 %
Neutro Abs: 7 10*3/uL (ref 1.7–7.7)
Neutrophils Relative %: 74 %
Platelets: 281 10*3/uL (ref 150–400)
RBC: 5.05 MIL/uL (ref 3.87–5.11)
RDW: 13.4 % (ref 11.5–15.5)
WBC: 9.5 10*3/uL (ref 4.0–10.5)
nRBC: 0 % (ref 0.0–0.2)

## 2019-06-27 LAB — MAGNESIUM: Magnesium: 1.5 mg/dL — ABNORMAL LOW (ref 1.7–2.4)

## 2019-06-27 MED ORDER — POTASSIUM CHLORIDE CRYS ER 20 MEQ PO TBCR
40.0000 meq | EXTENDED_RELEASE_TABLET | Freq: Once | ORAL | Status: AC
  Administered 2019-06-28: 01:00:00 40 meq via ORAL
  Filled 2019-06-27: qty 2

## 2019-06-27 NOTE — ED Triage Notes (Addendum)
Pt arrived via [facility] c/o acute anxiety, palpitations, and malaise x45 minutes. [facility] advised patient experienced several sustained runs of SVT (rates 180s) during transport. Adenosine 6mg  IV administered PTA. Pt now sinus tachycardia 120s, VS otherwise WNL.

## 2019-06-27 NOTE — ED Notes (Signed)
Pacemaker interogated °

## 2019-06-27 NOTE — ED Notes (Signed)
Patient denies pain and is resting comfortably.  

## 2019-06-27 NOTE — ED Provider Notes (Signed)
H/o CHF, EF <20% Near syncope, palpitations Got adenosine by EMS - has been baseline since Pacer interrogation showed V-fib, V-tach Being seen by cardiology now  Plan: per cardiology recommendation  Cardiology consult completed who feels the patient is appropriate for discharge home. Unsure of actual arrhythmia but feels SVT more likely than ventricular sourced. The patient remains asymptomatic after adenosine.   Cardiology (Heart Failure clinic) will contact the patient tomorrow to discuss further management. The patient is comfortable with plan of discharge.   Charlann Lange, PA-C 06/28/19 [place], [place], DO 06/28/19 1536

## 2019-06-27 NOTE — ED Provider Notes (Signed)
[facility] EMERGENCY DEPARTMENT Provider Note   CSN: [id number] Arrival date & time: 06/27/19  2047     History   Chief Complaint Chief Complaint  Patient presents with  . Palpitations  . Fatigue  . Irregular Heart Beat    HPI Kirsten Wheeler is a 38 y.o. female who presents with palpitations and near syncope. 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 She states that since she was discharged from the hospital last week she's felt good. She feels like the Milrinone has really helped with her shortness of breath. Tonight she was doing some things around the kitchen around 8PM and was bent over and when she stood up she felt like her heart was racing and got very lightheaded. She sat down but continued to feel unwell so she took her medicine and then called her nurse who urged her to call EMS which she did. She was found to have SVT with HR in the 180s. She was given adenosine and this caused it to break and now she feels back to baseline. She denies any other symptoms currently but is concerned that this happened and is concerned that her BP transiently when high (was 150s systolic by EMS)      HPI  Past Medical History:  Diagnosis Date  . Asthma   . Chronic systolic CHF (congestive heart failure) (HCC)    a. ECHO 01/15/2016 EF 10-15%. 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  . Diabetes mellitus (HCC) 06/17/2019  . Hearing loss    bilateral  . Mitral regurgitation    a. severe, felt to be functional 2/2 LV dilation   . Morbid obesity (HCC)   . Snoring     Patient Active Problem List   Diagnosis Date Noted  . Acute on chronic systolic CHF (congestive heart failure) (HCC) 06/17/2019  . Obstructive sleep apnea 06/17/2019  . 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 Diabetes mellitus (HCC) 06/17/2019  . Amenorrhea 08/03/2018  . Congestive heart  failure (HCC) 06/29/2018  . Hypotension 06/07/2018  . Rhinovirus infection 06/06/2018  . Asthma 06/05/2018  . Acute respiratory failure with hypoxia (HCC) 06/05/2018  . Hypokalemia 06/05/2018  . Snoring 09/23/2016  . Cough 04/10/2016  . 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 Tobacco abuse 01/25/2016  . Acute on chronic combined systolic and diastolic CHF (congestive heart failure) (HCC) 01/25/2016  . Mitral regurgitation   . Abnormal EKG-inf TWI 01/15/2016  . Positive D dimer-CTA neg for PE 01/15/2016  . Morbid obesity- BMI 57 01/14/2016  . Abdominal pain 01/14/2016  . 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 At risk for sleep apnea 01/14/2016  . Cardiomyopathy (HCC) 11/01/2015  . Morbid obesity with BMI of 50.0-59.9, adult (HCC) 08/16/2015    Past Surgical History:  Procedure Laterality Date  . CARDIAC CATHETERIZATION N/A 01/18/2016   Procedure: Right/Left Heart Cath and Coronary Angiography;  Surgeon: Laurey Morale, MD;  Location: [facility] INVASIVE CV LAB;  Service: Cardiovascular;  Laterality: N/A;  . CESAREAN SECTION    . ICD IMPLANT N/A 04/17/2017   Procedure: ICD Implant;  Surgeon: Regan Lemming, MD;  Location: [facility] INVASIVE CV LAB;  Service: Cardiovascular;  Laterality: N/A;  . IR FLUORO GUIDE CV LINE RIGHT  06/23/2019  . IR US GUIDE VASC ACCESS RIGHT  06/23/2019  . LEAD REVISION/REPAIR N/A 04/21/2017   Procedure: Lead Revision/Repair;  Surgeon: Regan Lemming,  MD;  Location: [facility] INVASIVE CV LAB;  Service: Cardiovascular;  Laterality: N/A;  . RIGHT HEART CATH N/A 06/21/2019   Procedure: RIGHT HEART CATH;  Surgeon: Laurey MoraleMcLean, Dalton S, MD;  [facility] CV LAB;  Service: Cardiovascular;  Laterality: N/A;  . TONSILLECTOMY       OB History   No obstetric history on file.      Home Medications    Prior to Admission medications   Medication Sig Start Date End Date Taking? Authorizing Provider  albuterol (PROAIR HFA) 108 (90 Base) MCG/ACT inhaler  Inhale 2 puffs into the lungs every 6 (six) hours as needed for wheezing or shortness of breath.   Yes [provider]  atorvastatin (LIPITOR) 10 MG tablet Take 10 mg by mouth daily.  02/02/19  Yes [provider]  carvedilol (COREG) 3.125 MG tablet Take 1 tablet (3.125 mg total) by mouth 2 (two) times daily with a meal. 06/25/19  Yes Robbie LisSimmons, Brittainy M, PA-C  digoxin (LANOXIN) 0.125 MG tablet Take 1 tablet (0.125 mg total) by mouth daily. 06/25/19  Yes Simmons, Brittainy M, PA-C  glipiZIDE (GLUCOTROL) 5 MG tablet Take 1 tablet (5 mg total) by mouth daily. 06/24/19 09/22/19 Yes Jerald Kiefhiu, Stephen K, MD  ipratropium-albuterol (DUONEB) 0.5-2.5 (3) MG/3ML SOLN Take 3 mLs by nebulization every 4 (four) hours as needed. Patient taking differently: Take 3 mLs by nebulization every 4 (four) hours as needed (for shortness of breath or wheezing).  06/08/18  Yes Roberto ScalesNettey, Shayla D, MD  ivabradine (CORLANOR) 5 MG TABS tablet Take 1 tablet (5 mg total) by mouth 2 (two) times daily with a meal. 06/25/19  Yes Robbie LisSimmons, Brittainy M, PA-C  milrinone (PRIMACOR) 20 MG/100 ML SOLN infusion Inject 0.0414 mg/min into the vein continuous. 06/25/19  Yes Sharol HarnessSimmons, Brittainy M, PA-C  potassium chloride SA (K-DUR) 20 MEQ tablet Take 2 tablets (40 mEq total) by mouth 2 (two) times daily. 04/21/19  Yes Clegg, [name] D, NP  PRESCRIPTION MEDICATION CPAP- AT BEDTIME   Yes [provider]  sacubitril-valsartan (ENTRESTO) 24-26 MG Take 1 tablet by mouth 2 (two) times daily. 06/25/19  Yes Robbie LisSimmons, Brittainy M, PA-C  spironolactone (ALDACTONE) 25 MG tablet Take 1 tablet (25 mg total) by mouth daily. 06/25/19  Yes Simmons, Brittainy M, PA-C  torsemide (DEMADEX) 20 MG tablet TAKE 4 TABLETS BY MOUTH EVERY MORNING AND 3 TABLETS EVERY EVENING 06/25/19  Yes Allayne ButcherSimmons, Brittainy M, PA-C    Family History Family History  Problem Relation Age of Onset  . Heart attack Mother   . Hyperlipidemia Father   . Diabetes Brother     Social  History Social History   Tobacco Use  . Smoking status: Former Smoker    Quit date: 10/19/2015    Years since quitting: 3.6  . Smokeless tobacco: Never Used  Substance Use Topics  . Alcohol use: Yes    Alcohol/week: 0.0 standard drinks    Comment: occasionally  . Drug use: Yes    Types: Marijuana    Comment: only socially     Allergies   Metformin and related   Review of Systems Review of Systems  Constitutional: Negative for fever.  Respiratory: Negative for shortness of breath.   Cardiovascular: Positive for palpitations. Negative for chest pain and leg swelling.  Gastrointestinal: Negative for abdominal pain.  Neurological: Positive for light-headedness. Negative for syncope.  All other systems reviewed and are negative.    Physical Exam Updated Vital Signs BP (!) 103/59   Pulse (!) 103  Temp 97.8 F (36.6 C) (Tympanic)   Resp 17   Ht 5\' 5"  (1.651 m)   Wt (!) 162.8 kg   LMP 06/24/2019   SpO2 98%   BMI 59.74 kg/m   Physical Exam Vitals signs and nursing note reviewed.  Constitutional:      General: She is not in acute distress.    Appearance: She is well-developed. She is obese. She is not ill-appearing.  HENT:     Head: Normocephalic and atraumatic.  Eyes:     General: No scleral icterus.       Right eye: No discharge.        Left eye: No discharge.     Conjunctiva/sclera: Conjunctivae normal.     Pupils: Pupils are equal, round, and reactive to light.  Neck:     Musculoskeletal: Normal range of motion.  Cardiovascular:     Rate and Rhythm: Tachycardia present.     Comments: Mild tachycardia with HR ~105  PICC in RU chest wall with milrinone infusion running Pulmonary:     Effort: Pulmonary effort is normal. No respiratory distress.     Breath sounds: Normal breath sounds.  Abdominal:     General: There is no distension.  Skin:    General: Skin is warm and dry.  Neurological:     Mental Status: She is alert and oriented to person, place,  and time.  Psychiatric:        Behavior: Behavior normal.      ED Treatments / Results  Labs (all labs ordered are listed, but only abnormal results are displayed) Labs Reviewed  BASIC METABOLIC PANEL - Abnormal; Notable for the following components:      Result Value   Potassium 3.0 (*)    Chloride 94 (*)    Glucose, Bld 243 (*)    BUN 25 (*)    Creatinine, Ser 1.03 (*)    All other components within normal limits  CBC WITH DIFFERENTIAL/PLATELET - Abnormal; Notable for the following components:   MCV 79.2 (*)    All other components within normal limits  DIGOXIN LEVEL - Abnormal; Notable for the following components:   Digoxin Level <0.2 (*)    All other components within normal limits  MAGNESIUM - Abnormal; Notable for the following components:   Magnesium 1.5 (*)    All other components within normal limits    EKG None  Radiology Dg Chest Port 1 View  Result Date: 06/27/2019 CLINICAL DATA:  Palpitations, near syncope EXAM: PORTABLE CHEST 1 VIEW COMPARISON:  None. FINDINGS: Single lead defibrillator battery pack overlies the left chest wall with a lead in stable position at the intraventricular septum. Tunneled right IJ catheter tip terminates in the lower SVC. Cardiomegaly is similar to comparison exams. Accounting for body habitus, the lungs are clear. No acute osseous or soft tissue abnormality. IMPRESSION: 1. No acute cardiopulmonary disease. 2. Unchanged cardiomegaly. Electronically Signed   By: Lovena Le M.D.   On: 06/27/2019 22:05    Procedures Procedures (including critical care time)  Medications Ordered in ED Medications  potassium chloride SA (KLOR-CON) CR tablet 40 mEq (has no administration in time range)     Initial Impression / Assessment and Plan / ED Course  I have reviewed the triage vital signs and the nursing notes.  Pertinent labs & imaging results that were available during my care of the patient were reviewed by me and considered in my  medical decision making (see chart for details).  [place]  year old female presents with episode of palpitations and near syncope tonight. EKG shows sinus tachycardia. Otherwise vitals are normal. She feels back to normal. Will obtain labs, CXR, interrogate pacer  CBC is normal. BMP shows hypokalemia (3.0), hyperglycemia (243), mildly elevated BUN/SCr, and low Mag. Shared visit with Dr. Lockie Mola. Will discuss with Cardiology.  Discussed with Dr. Okey Dupre. He will see pt.  Final Clinical Impressions(s) / ED Diagnoses   Final diagnoses:  Palpitations    ED Discharge Orders    None       Bethel Born, PA-C 06/28/19 0032    Virgina Norfolk, DO 06/28/19 1533

## 2019-06-28 LAB — FACTOR 5 LEIDEN

## 2019-06-28 NOTE — Telephone Encounter (Signed)
Please follow protocol by [facility] home milrinone. Ask them to reahc out to Carolynn Sayers with [facility] for protocol.   Weekly CMET, CBC, and Magnesium. Needs peripheral stick for lab work   PT consult. We will follow her in the clinic for social work and try to connect her with [place]. Please refer her.   Thks Cricket Goodlin NP-C

## 2019-06-28 NOTE — Discharge Instructions (Addendum)
You can be discharged home tonight and can expect to hear from the cardiologist tomorrow to discuss further options for care.   Return to the ED with any new or worsening symptoms.

## 2019-06-28 NOTE — Consult Note (Signed)
Cardiology Consult    [name] MRN: [id number], DOB/AGE: Jan 25, 1981   Admit date: 06/27/2019 Date of Consult: 06/28/2019  Primary Physician: Eunice Blase, PA-C Primary Cardiologist: Dr. Gala Romney Requesting Provider: Terance Hart, PA-C  Patient Profile    Kirsten Wheeler is a 38 y.o. 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 Not a transplant or LVAD candidate primarily due to weight (BMI 60), also concerns about social support. Recently started on milrinone during admission earlier this month, discharged just a few days ago on 0.25 mcg/kg/min as bridge to LVAD candidacy (needs to lose weight). Consulted for tachycardia.  History of Present Illness    Has been doing well on milrinone since discharge. Feeling better, stable since discharge without any interval swelling, orthopnea, dyspnea. Today was trying to be more active around the house. Began to have palpitations, eventually had an episode where she felt like her heart "couldn't keep up" and felt like she might pass out. This lasted for less than a minute then resolved. She called EMS and apparently en route had sustained narrow complex tachycardia that was adenosine sensitive. Sinus tachycardia in ED on arrival, normotensive and stable. Back to baseline. No further arrhythmias here. However, labs were signigificant for K 3.0, Mg 1.5, digoxin undetectable. Interrogation personally performed, see below.     Past Medical History   Past Medical History:  Diagnosis Date  . Asthma   . Chronic systolic CHF (congestive heart failure) (HCC)    a. ECHO 01/15/2016 EF 10-15%. 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  . Diabetes mellitus (HCC) 06/17/2019  . Hearing loss    bilateral  . Mitral regurgitation    a. severe, felt to be functional 2/2 LV dilation   . Morbid obesity (HCC)   . Snoring     Past Surgical History:  Procedure Laterality Date  . CARDIAC CATHETERIZATION N/A  01/18/2016   Procedure: Right/Left Heart Cath and Coronary Angiography;  Surgeon: Laurey Morale, MD;  Location: [facility] INVASIVE CV LAB;  Service: Cardiovascular;  Laterality: N/A;  . CESAREAN SECTION    . ICD IMPLANT N/A 04/17/2017   Procedure: ICD Implant;  Surgeon: Regan Lemming, MD;  Location: [facility] INVASIVE CV LAB;  Service: Cardiovascular;  Laterality: N/A;  . IR FLUORO GUIDE CV LINE RIGHT  06/23/2019  . IR US GUIDE VASC ACCESS RIGHT  06/23/2019  . LEAD REVISION/REPAIR N/A 04/21/2017   Procedure: Lead Revision/Repair;  Surgeon: Regan Lemming, MD;  Location: [facility] INVASIVE CV LAB;  Service: Cardiovascular;  Laterality: N/A;  . RIGHT HEART CATH N/A 06/21/2019   Procedure: RIGHT HEART CATH;  Surgeon: Laurey Morale, MD;  Location: [facility] INVASIVE CV LAB;  Service: Cardiovascular;  Laterality: N/A;  . TONSILLECTOMY       Allergies  Allergen Reactions  . Metformin And Related Diarrhea    And caused severe "dry mouth," also   Inpatient Medications      Family History    Family History  Problem Relation Age of Onset  . Heart attack Mother   . Hyperlipidemia Father   . Diabetes Brother    She indicated that the status of her mother is unknown. She indicated that the status of her father is unknown. She indicated that the status of her brother is unknown.   Social History    Social History   Socioeconomic History  . Marital status: Single    Spouse name: Not on file  .  Number of children: 2  . Years of education: Not on file  . Highest education level: Not on file  Occupational History  . Not on file  Social Needs  . Financial resource strain: Not very hard  . Food insecurity    Worry: Never true    Inability: Never true  . Transportation needs    Medical: Yes    Non-medical: Yes  Tobacco Use  . Smoking status: Former Smoker    Quit date: 10/19/2015    Years since quitting: 3.6  . Smokeless tobacco: Never Used  Substance and Sexual Activity  . Alcohol use: Yes     Alcohol/week: 0.0 standard drinks    Comment: occasionally  . Drug use: Yes    Types: Marijuana    Comment: only socially  . Sexual activity: Yes  Lifestyle  . Physical activity    Days per week: Not on file    Minutes per session: Not on file  . Stress: Not on file  Relationships  . Social Musicianconnections    Talks on phone: Not on file    Gets together: Not on file    Attends religious service: Not on file    Active member of club or organization: Not on file    Attends meetings of clubs or organizations: Not on file    Relationship status: Not on file  . Intimate partner violence    Fear of current or ex partner: Not on file    Emotionally abused: Not on file    Physically abused: Not on file    Forced sexual activity: Not on file  Other Topics Concern  . Not on file  Social History Narrative  . Not on file     Review of Systems    General:  No chills, fever, night sweats or weight changes.  Cardiovascular:  No chest pain, dyspnea on exertion, edema, orthopnea, palpitations, paroxysmal nocturnal dyspnea. Dermatological: No rash, lesions/masses Respiratory: No cough, dyspnea Urologic: No hematuria, dysuria Abdominal:   No nausea, vomiting, diarrhea, bright red blood per rectum, melena, or hematemesis Neurologic:  No visual changes, wkns, changes in mental status. All other systems reviewed and are otherwise negative except as noted above.  Physical Exam    Blood pressure 113/83, pulse 97, temperature 97.8 F (36.6 C), temperature source Tympanic, resp. rate (!) 21, height 5\' 5"  (1.651 m), weight (!) 162.8 kg, last menstrual period 06/24/2019, SpO2 95 %.    No intake or output data in the 24 hours ending 06/28/19 0540 Wt Readings from Last 3 Encounters:  06/27/19 (!) 162.8 kg  06/25/19 (!) 162.1 kg  04/20/19 (!) 166.8 kg    CONSTITUTIONAL: alert and conversant, morbidly obese, somewhat anxious appearing, in no distress CARDIOVASCULAR: Regular rhythm. Faint systolic  murmur, no S3/S4. Radial pulses intact. No JVD.  PULMONARY/CHEST WALL: no deformities, normal breath sounds bilaterally, normal work of breathing ABDOMINAL: soft, non-tender, non-distended EXTREMITIES: CVAD site c/d/i with milrinone infusing. No edema, warm and well-perfused SKIN: Dry and intact without apparent rashes or wounds. NEUROLOGIC: alert, no abnormal movements, cranial nerves grossly intact.   Labs    Troponin (Point of Care Test) No results for input(s): TROPIPOC in the last 72 hours. No results for input(s): CKTOTAL, CKMB, TROPONINI in the last 72 hours. Lab Results  Component Value Date   WBC 9.5 06/27/2019   HGB 14.1 06/27/2019   HCT 40.0 06/27/2019   MCV 79.2 (L) 06/27/2019   PLT 281 06/27/2019    Recent Labs  Lab 06/22/19 0530  06/27/19 2228  NA 134*   < > 135  K 3.6   < > 3.0*  CL 92*   < > 94*  CO2 28   < > 28  BUN 27*   < > 25*  CREATININE 1.22*   < > 1.03*  CALCIUM 10.1   < > 9.3  PROT 7.6  --   --   BILITOT 1.1  --   --   ALKPHOS 50  --   --   ALT 28  --   --   AST 29  --   --   GLUCOSE 192*   < > 243*   < > = values in this interval not displayed.   Lab Results  Component Value Date   CHOL 89 06/17/2019   HDL 24 (L) 06/17/2019   LDLCALC 41 06/17/2019   TRIG 121 06/17/2019   Lab Results  Component Value Date   DDIMER 0.99 (H) 06/05/2018     Radiology Studies    Dg Chest 2 View  Result Date: 06/19/2019 CLINICAL DATA:  Dyspnea and cough. EXAM: CHEST - 2 VIEW COMPARISON:  06/17/2019 FINDINGS: Left-sided pacemaker unchanged. Lungs are adequately inflated and demonstrate persistent mild interstitial edema with mild cardiomegaly. No effusion. Remainder the exam is unchanged. IMPRESSION: Mild stable cardiomegaly with stable mild vascular congestion. Electronically Signed   By: Elberta Fortisaniel  Boyle M.D.   On: 06/19/2019 11:14   Dg Chest 2 View  Result Date: 06/17/2019 CLINICAL DATA:  SOB, cough for 5 days - also having ankle swelling, abd distension -  pt states recent congestion issues - hx of CHF, asthma, mitral regurgitation EXAM: CHEST - 2 VIEW COMPARISON:  06/08/2018.  Chest CT, 06/05/2018. FINDINGS: Moderate enlargement of the cardiopericardial silhouette, stable. No mediastinal or hilar masses. No evidence of adenopathy. There are prominent bronchovascular markings and mild interstitial thickening which is similar to the prior chest radiographs. No lung consolidation. No pleural effusion or pneumothorax. Stable left anterior chest wall single lead ICD. Skeletal structures are intact. IMPRESSION: 1. Cardiomegaly with mild interstitial thickening similar to the prior study. Suspect mild congestive heart failure. Electronically Signed   By: Amie Portlandavid  Ormond M.D.   On: 06/17/2019 14:15   Ir Fluoro Guide Cv Line Right  Result Date: 06/23/2019 CLINICAL DATA:  Heart failure, needs durable venous access for IV mGy known EXAM: TUNNELED CENTRAL VENOUS CATHETER PLACEMENT WITH ULTRASOUND AND FLUOROSCOPIC GUIDANCE TECHNIQUE: The procedure, risks, benefits, and alternatives were explained to the patient. Questions regarding the procedure were encouraged and answered. The patient understands and consents to the procedure. Patency of the right IJ vein was confirmed with ultrasound with image documentation. An appropriate skin site was determined. Region was prepped using maximum barrier technique including cap and mask, sterile gown, sterile gloves, large sterile sheet, and Chlorhexidine as cutaneous antisepsis. The region was infiltrated locally with 1% lidocaine. Under real-time ultrasound guidance, the right IJ vein was accessed with a 21 gauge micropuncture needle; the needle tip within the vein was confirmed with ultrasound image documentation. 41F single cuffed powerPICC tunneled from a right anterior chest wall approach to the dermatotomy site. Needle exchanged over the 018 guidewire for transitional dilator, through which the catheter which had been cut to 25 cm  was advanced under intermittent fluoroscopy, positioned with its tip at the cavoatrial junction. Spot chest radiograph confirms good catheter position. No pneumothorax. Catheter was flushed per protocol. Catheter secured externally with O Prolene suture. The right IJ dermatotomy  site was closed with Dermabond. COMPLICATIONS: COMPLICATIONS None immediate FLUOROSCOPY TIME:  0.2 minute; 380 uGym2 DAP COMPARISON:  None IMPRESSION: 1. Technically successful placement of tunneled right IJ tunneled single power injectable catheter with ultrasound and fluoroscopic guidance. Ready for routine use. Electronically Signed   By: Lucrezia Europe M.D.   On: 06/23/2019 13:13   Ir US Guide Vasc Access Right  Result Date: 06/23/2019 CLINICAL DATA:  Heart failure, needs durable venous access for IV mGy known EXAM: TUNNELED CENTRAL VENOUS CATHETER PLACEMENT WITH ULTRASOUND AND FLUOROSCOPIC GUIDANCE TECHNIQUE: The procedure, risks, benefits, and alternatives were explained to the patient. Questions regarding the procedure were encouraged and answered. The patient understands and consents to the procedure. Patency of the right IJ vein was confirmed with ultrasound with image documentation. An appropriate skin site was determined. Region was prepped using maximum barrier technique including cap and mask, sterile gown, sterile gloves, large sterile sheet, and Chlorhexidine as cutaneous antisepsis. The region was infiltrated locally with 1% lidocaine. Under real-time ultrasound guidance, the right IJ vein was accessed with a 21 gauge micropuncture needle; the needle tip within the vein was confirmed with ultrasound image documentation. 30F single cuffed powerPICC tunneled from a right anterior chest wall approach to the dermatotomy site. Needle exchanged over the 018 guidewire for transitional dilator, through which the catheter which had been cut to 25 cm was advanced under intermittent fluoroscopy, positioned with its tip at the cavoatrial  junction. Spot chest radiograph confirms good catheter position. No pneumothorax. Catheter was flushed per protocol. Catheter secured externally with O Prolene suture. The right IJ dermatotomy site was closed with Dermabond. COMPLICATIONS: COMPLICATIONS None immediate FLUOROSCOPY TIME:  0.2 minute; 380 uGym2 DAP COMPARISON:  None IMPRESSION: 1. Technically successful placement of tunneled right IJ tunneled single power injectable catheter with ultrasound and fluoroscopic guidance. Ready for routine use. Electronically Signed   By: Lucrezia Europe M.D.   On: 06/23/2019 13:13   Dg Chest Port 1 View  Result Date: 06/27/2019 CLINICAL DATA:  Palpitations, near syncope EXAM: PORTABLE CHEST 1 VIEW COMPARISON:  None. FINDINGS: Single lead defibrillator battery pack overlies the left chest wall with a lead in stable position at the intraventricular septum. Tunneled right IJ catheter tip terminates in the lower SVC. Cardiomegaly is similar to comparison exams. Accounting for body habitus, the lungs are clear. No acute osseous or soft tissue abnormality. IMPRESSION: 1. No acute cardiopulmonary disease. 2. Unchanged cardiomegaly. Electronically Signed   By: Lovena Le M.D.   On: 06/27/2019 22:05    ECG & Cardiac Imaging    ECG shows LBBB, LVH with repolarization abnormality - personally reviewed.  Brief Interrogation Summary:  - 1 aborted VF-zone event at 20:01 with ATP x1 (rate 240 bpm, 12 seconds) - 15 VT-NS events today from 20:08 to 20:53 lasting from 1 to 9 seconds - There is no evidence of sustained tachyarrhythmias with rates in the 180s that were reported by EMS - Interrogation given to Dr. Haroldine Laws  Assessment & Plan    1. Aborted tachyarrhythmia 2. Hypokalemia and hypomagnesemia 3. NICM on chronic milrinone, compensated She only has an RV lead so uncertain if this was a ventricular or supraventricular arrhythmia. EMS (single lead) rhythm strip shows a narrow complex tachycardia and at some point  apparently responded to adenosine, so this may have been an SVT. Likely precipitated by hypokalemia and hypomagnesemia in the setting of chronic milrinone and cardiomyopathy. She is compensated and appears to be euvolemic. She feels well and is back  to baseline without any further arrhythmias. Correct potassium and magnesium and she can be discharged. I will notify Dr. Gala Romney in the morning of her ED visit for further management and to arrange follow-up.   Signed, Clerance Lav, MD 06/28/2019, 5:40 AM  For questions or updates, please contact   Please consult [email or web address] for contact info under Cardiology/STEMI.

## 2019-06-29 ENCOUNTER — Telehealth (HOSPITAL_COMMUNITY): Payer: Self-pay | Admitting: Licensed Clinical Social Worker

## 2019-06-29 ENCOUNTER — Telehealth: Payer: Self-pay

## 2019-06-29 NOTE — Telephone Encounter (Signed)
Called to discuss health coaching steps with pt.

## 2019-06-29 NOTE — Addendum Note (Signed)
Addended by: Valeda Malm on: 06/29/2019 10:29 AM   Modules accepted: Orders

## 2019-06-29 NOTE — Telephone Encounter (Signed)
Spoke with Tanya from [facility] updated her with orders from NP.  Per Lavella Lemons, she states that [facility] emailed her with plan per protocol for home milrinone.  Patient will have biweekly bmet, mag and cbc w/ diff AND will have weekly bmet.   Pt referred to [facility].

## 2019-06-29 NOTE — Telephone Encounter (Signed)
CSW completed paramedicine referral for patient and sent to team for follow up.  Pt also interested in getting PCS services at home to help with housework or cleaning as it has become more difficult for her to manage on her own- form completed and faxed in to [facility] for review.  CSW continuing to follow patient and support as needed  Jorge Ny, [facility] Desk#: [phone] Cell#: [phone]

## 2019-07-01 ENCOUNTER — Telehealth (HOSPITAL_COMMUNITY): Payer: Self-pay | Admitting: Licensed Clinical Social Worker

## 2019-07-01 NOTE — Telephone Encounter (Signed)
CSW spoke with [employer] again this morning regarding PA for pt milrinone home infusion supplies- they still have not uploaded request from 10/8.  They encouraged CSW to attempt doing PA request online- CSW able to submit request and awaiting call from [employer] to confirm it was successful.  CSW will continue to follow and assist as needed  Jorge Ny, [facility] Desk#: [phone] Cell#: [phone]

## 2019-07-05 ENCOUNTER — Telehealth (HOSPITAL_COMMUNITY): Payer: Self-pay | Admitting: Licensed Clinical Social Worker

## 2019-07-05 NOTE — Telephone Encounter (Signed)
CSW with multiple interactions with patient throughout the day checking in on progress.  Pt somewhat discouraged as she has not lost any weight this week.  CSW provided active listening an encouragement.  Pt continuing to be motivated to improve diet and is hopeful to set up an appt with a weight loss program later this week to help keep her on track.  Pt will have first appt with paramedic tomorrow and she is excited to have some additional in home support.  CSW will continue to follow and assist as needed  Jorge Ny, [facility] Desk#: [phone] Cell#: [phone]

## 2019-07-06 ENCOUNTER — Other Ambulatory Visit (HOSPITAL_COMMUNITY): Payer: Self-pay

## 2019-07-06 NOTE — Progress Notes (Signed)
Paramedicine Encounter    [name], female    DOB: 1981-01-05, 38 y.o.   MRN: [id number]   Patient Care Team: Otila Back as PCP - General (Internal Medicine) Regan Lemming, MD as PCP - Electrophysiology (Cardiology) Laurey Morale, MD as PCP - Advanced Heart Failure (Cardiology)  Patient Active Problem List   Diagnosis Date Noted  . Acute on chronic systolic CHF (congestive heart failure) (HCC) 06/17/2019  . Obstructive sleep apnea 06/17/2019  . 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 Diabetes mellitus (HCC) 06/17/2019  . Amenorrhea 08/03/2018  . Congestive heart failure (HCC) 06/29/2018  . Hypotension 06/07/2018  . Rhinovirus infection 06/06/2018  . Asthma 06/05/2018  . Acute respiratory failure with hypoxia (HCC) 06/05/2018  . Hypokalemia 06/05/2018  . Snoring 09/23/2016  . Cough 04/10/2016  . 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 Tobacco abuse 01/25/2016  . Acute on chronic combined systolic and diastolic CHF (congestive heart failure) (HCC) 01/25/2016  . Mitral regurgitation   . Abnormal EKG-inf TWI 01/15/2016  . Positive D dimer-CTA neg for PE 01/15/2016  . Morbid obesity- BMI 57 01/14/2016  . Abdominal pain 01/14/2016  . 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 At risk for sleep apnea 01/14/2016  . Cardiomyopathy (HCC) 11/01/2015  . Morbid obesity with BMI of 50.0-59.9, adult (HCC) 08/16/2015    Current Outpatient Medications:  .  albuterol (PROAIR HFA) 108 (90 Base) MCG/ACT inhaler, Inhale 2 puffs into the lungs every 6 (six) hours as needed for wheezing or shortness of breath., Disp: , Rfl:  .  atorvastatin (LIPITOR) 10 MG tablet, Take 10 mg by mouth daily. , Disp: , Rfl:  .  carvedilol (COREG) 3.125 MG tablet, Take 1 tablet (3.125 mg total) by mouth 2 (two) times daily with a meal., Disp: 60 tablet, Rfl: 5 .  digoxin (LANOXIN) 0.125 MG tablet, Take 1 tablet (0.125 mg total) by mouth daily.,  Disp: 90 tablet, Rfl: 3 .  glipiZIDE (GLUCOTROL) 5 MG tablet, Take 1 tablet (5 mg total) by mouth daily., Disp: 90 tablet, Rfl: 0 .  ipratropium-albuterol (DUONEB) 0.5-2.5 (3) MG/3ML SOLN, Take 3 mLs by nebulization every 4 (four) hours as needed. (Patient taking differently: Take 3 mLs by nebulization every 4 (four) hours as needed (for shortness of breath or wheezing). ), Disp: 360 mL, Rfl: 0 .  ivabradine (CORLANOR) 5 MG TABS tablet, Take 1 tablet (5 mg total) by mouth 2 (two) times daily with a meal., Disp: 60 tablet, Rfl: 5 .  milrinone (PRIMACOR) 20 MG/100 ML SOLN infusion, Inject 0.0414 mg/min into the vein continuous., Disp:  , Rfl:  .  potassium chloride SA (K-DUR) 20 MEQ tablet, Take 2 tablets (40 mEq total) by mouth 2 (two) times daily., Disp: 120 tablet, Rfl: 6 .  PRESCRIPTION MEDICATION, CPAP- AT BEDTIME, Disp: , Rfl:  .  sacubitril-valsartan (ENTRESTO) 24-26 MG, Take 1 tablet by mouth 2 (two) times daily., Disp: 60 tablet, Rfl: 5 .  spironolactone (ALDACTONE) 25 MG tablet, Take 1 tablet (25 mg total) by mouth daily., Disp: 90 tablet, Rfl: 3 .  torsemide (DEMADEX) 20 MG tablet, TAKE 4 TABLETS BY MOUTH EVERY MORNING AND 3 TABLETS EVERY EVENING (Patient taking differently: Take 60-80 mg by mouth See admin instructions. TAKE 4 TABLETS BY MOUTH EVERY MORNING AND 3 TABLETS EVERY EVENING), Disp: 420 tablet, Rfl: 2 Allergies  Allergen Reactions  . Metformin And Related Diarrhea    And caused severe "dry mouth," also  Social History   Socioeconomic History  . Marital status: Single    Spouse name: Not on file  . Number of children: 2  . Years of education: Not on file  . Highest education level: Not on file  Occupational History  . Not on file  Social Needs  . Financial resource strain: Not very hard  . Food insecurity    Worry: Never true    Inability: Never true  . Transportation needs    Medical: Yes    Non-medical: Yes  Tobacco Use  . Smoking status: Former Smoker     Quit date: 10/19/2015    Years since quitting: 3.7  . Smokeless tobacco: Never Used  Substance and Sexual Activity  . Alcohol use: Yes    Alcohol/week: 0.0 standard drinks    Comment: occasionally  . Drug use: Yes    Types: Marijuana    Comment: only socially  . Sexual activity: Yes  Lifestyle  . Physical activity    Days per week: Not on file    Minutes per session: Not on file  . Stress: Not on file  Relationships  . Social Musician on phone: Not on file    Gets together: Not on file    Attends religious service: Not on file    Active member of club or organization: Not on file    Attends meetings of clubs or organizations: Not on file    Relationship status: Not on file  . Intimate partner violence    Fear of current or ex partner: Not on file    Emotionally abused: Not on file    Physically abused: Not on file    Forced sexual activity: Not on file  Other Topics Concern  . Not on file  Social History Narrative  . Not on file    Physical Exam      Future Appointments  Date Time Provider Department Center  07/07/2019 11:00 AM [facility] PA/NP [facility] None  07/26/2019  7:45 AM [facility] DEVICE REMOTES [facility]  08/06/2019  9:00 AM Jobe, Rolin Barry, RD [facility] NDM  10/25/2019  7:45 AM [facility] DEVICE REMOTES [facility]  01/24/2020  7:45 AM [facility] DEVICE REMOTES [facility]  04/24/2020  7:45 AM [facility] DEVICE REMOTES [facility]  07/24/2020  7:45 AM [facility] DEVICE REMOTES [facility]    BP (!) 80/0 Comment: systolic  Pulse 66   Temp 97.7 F (36.5 C)   Resp 18   LMP 06/24/2019   [facility]  First home visit with pt. She lives with her 2 daughters in the home.  She has home health nurse that comes out weekly to do the milrinone drip change and do labs.  Her primary pharmacy is at [facility]. She does have some from [facility] She said sometimes she has to wait to  get paid until she gets her meds. I advised her to let us know if she gets in that predicament and we can assist.   --needs refill on albuterol inhaler and her neb meds--she will get her PCP to handle that this week.   [facility] on [address] is her PCP care---dr. Daron Offer reports having an appoint for this Friday to go there.   She reports not using her CPAP nightly due the discomfort of the mask.  encouraged her to use it every night.  She is not using a pill box organizer at this time, but is interested in using one  now.  She was able to verify with each med how to take it appropriately.  She reports sometimes the carvedilol makes her feel tired.  She reports CBG's of 150-170 in am and then at night its up to 200's.  She reports a low sodium diet.  She states she has to lose 50lbs to move forward to getting a VAD.  She reports stopped smoking cigarettes. Denies drinking alcohol.  She states her scales are bouncing around numbers when she weighs but she moves the scales around in the house so I told her to keep them in one spot to weigh daily.  She drives herself.  She denies any sob, she reports an episode of sharp pains in center of her chest, she felt like it was gas. She got up moving around and it went away.  She gets dizzy when she moves around too fast.  She is frustrated b/c she has to move slow but then tries to do do exercise to lose weight so she is between rock and hard place with that.  She does have slight swelling to her feet, she reports being on her feet a lot today.  Her b/p is as noted. She took her meds right when I arrived of the digoxin, spiro, atorvastatin and glipizide.   She needs rx of atorvastatin to [facility].  She wanted to try the pill box so that was done-she only had ator for 3 days.  The rest of meds was placed in pill box.  She was instructed on how to use and she understood how to take pills.   [name],  [facility] Paramedic  07/06/19

## 2019-07-06 NOTE — Progress Notes (Signed)
Advanced Heart Failure Clinic Note   Referring Physician: PCP: Eunice Blase, PA-C PCP-Cardiologist: Dr. 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 She presented to the ER on 06/17/19 with several days of worsening HF with SOB at rest, marked edema and orthopnea/PND, not responding to increased doses of oral diuretics. She admitted to high levels of water and Gatorade intake.   Her admit wt was 380 lb (Dry wt ~360 lb). She was started on IV Lasix but had initial poor urinary response. PO metolazone added as adjunct. Repeat echo was obtained and showed EF <20%, relatively normal RV, moderate MR. RHC 10/5 with low output (CI 1.8) and elevated PCWP but near normal RA pressure. She was started on inotropic therapy w/ milrinone for low output, 0.25 mcg/kg/min. Also treated w/ Entresto, Coreg, spironolactone, digoxin and ivabradine. She was later transitioned off of IV Lasix to po torsemide 80 mg qam/60 mg qpm.   Unfortunately, she is not a transplant candidate due to her morbid obesity. We discussed her at [facility] and Dr. Donata Clay has seen. She will need significant weight loss to get to LVAD. Will use home milrinone to try to facilitate increased activity to work towards weight loss. She understands that milrinone is not a good end-point and that we would like to use it as a bridge to eventual LVAD. RV function seems adequate for LVAD. Pt is motivated to lose weight.   Tunnel cath was placed for home milrinone and home health was arranged. She was discharged home on 10/9.  10/11, she presented to the [facility] w/ CC of palpitations. Was brought in by EMS and was reportedly in SVT with HR in the 180s. She was given adenosine and converted to NSR prior to arrival to the ED. In  the ED she was stable and symptoms had resolved. Labs showed normal CBC. BMP showed hypokalemia (3.0), hyperglycemia (243), mildly elevated BUN/SCr, and low Mag, 1.5. She was given K and Mg supplementation and discharged home from the ED.   Today in f/u, she reports that she is feeling remarkably better. Exercise tolerance has improved significantly w/ Entresto. No exertional dyspnea or fatigue. She is now able to walk up the flight of stairs to get to her 2nd floor apartment. She is motivated to get started w/ her exercise program. She reports full med compliance. Paramedicine does report that she has occasional low BP w/ SBPs in the 80s-90s but has been asymptomatic. BP 120/80 today. She has noticed some slight increase in her home weights and reports full compliance w/ torsemide. Office wt today is 370 lb but she is wearing multiple layers and jewelry. Her home wt this morning was 363 (357 was hospital d/c wt). She denies any recurrent palpitations. Her device was interrogated. No ventricular events. No afib. Thoracic impedence below threshold and fluid index trending up.     RHC Procedural Findings (10/5): Hemodynamics (mmHg) RA mean 7 RV 49/10 PA 65/25 mean 45 PCWP mean 27 Oxygen saturations: PA 57% AO 100% Cardiac Output (Fick) 4.65  Cardiac Index (Fick) 1.82 PVR 3.87 WU PAPI 5.7  2D Echo 06/18/19  Left ventricular ejection fraction, by visual estimation, is <20%. The left ventricle has severely decreased function. Severely increased left ventricular size. There is no  left ventricular hypertrophy. Severe global hypokinesis. 2. Left ventricular diastolic Doppler parameters are indeterminate pattern of LV diastolic filling. 3. Global right ventricle has low normal systolic function.The right ventricular size is normal. No increase in right ventricular wall thickness. 4. Left atrial size was severely dilated. 5. Right atrial size was mildly dilated. 6. The mitral valve is abnormal.  Moderate mitral valve regurgitation. 7. The tricuspid valve is grossly normal. Tricuspid valve regurgitation is trivial. 8. The aortic valve is tricuspid Aortic valve regurgitation was not visualized by color flow Doppler. 9. The pulmonic valve was grossly normal. Pulmonic valve regurgitation is not visualized by color flow Doppler. 10. Mildly elevated pulmonary artery systolic pressure. 11. A pacer wire is visualized. 12. The inferior vena cava is dilated in size with >50% respiratory variability, suggesting right atrial pressure of 8 mmHg. 13. The interatrial septum was not well visualized.  Review of Systems: [y] = yes, [ ]  = no   General: Weight gain [ ] ; Weight loss [ ] ; Anorexia [ ] ; Fatigue [ ] ; Fever [ ] ; Chills [ ] ; Weakness [ ]   Cardiac: Chest pain/pressure [ ] ; Resting SOB [ ] ; Exertional SOB [ ] ; Orthopnea [ ] ; Pedal Edema [ ] ; Palpitations [ ] ; Syncope [ ] ; Presyncope [ ] ; Paroxysmal nocturnal dyspnea[ ]   Pulmonary: Cough [ ] ; Wheezing[ ] ; Hemoptysis[ ] ; Sputum [ ] ; Snoring [ ]   GI: Vomiting[ ] ; Dysphagia[ ] ; Melena[ ] ; Hematochezia [ ] ; Heartburn[ ] ; Abdominal pain [ ] ; Constipation [ ] ; Diarrhea [ ] ; BRBPR [ ]   GU: Hematuria[ ] ; Dysuria [ ] ; Nocturia[ ]   Vascular: Pain in legs with walking [ ] ; Pain in feet with lying flat [ ] ; Non-healing sores [ ] ; Stroke [ ] ; TIA [ ] ; Slurred speech [ ] ;  Neuro: Headaches[ ] ; Vertigo[ ] ; Seizures[ ] ; Paresthesias[ ] ;Blurred vision [ ] ; Diplopia [ ] ; Vision changes [ ]   Ortho/Skin: Arthritis [ ] ; Joint pain [ ] ; Muscle pain [ ] ; Joint swelling [ ] ; Back Pain [ ] ; Rash [ ]   Psych: Depression[ ] ; Anxiety[ ]   Heme: Bleeding problems [ ] ; Clotting disorders [ ] ; Anemia [ ]   Endocrine: Diabetes [ ] ; Thyroid dysfunction[ ]    Past Medical History:  Diagnosis Date  . Asthma   . Chronic systolic CHF (congestive heart failure) (HCC)    a. ECHO 01/15/2016 EF 10-15%. 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  . Diabetes mellitus (HCC) 06/17/2019  .  Hearing loss    bilateral  . Mitral regurgitation    a. severe, felt to be functional 2/2 LV dilation   . Morbid obesity (HCC)   . Snoring     Current Outpatient Medications  Medication Sig Dispense Refill  . albuterol (PROAIR HFA) 108 (90 Base) MCG/ACT inhaler Inhale 2 puffs into the lungs every 6 (six) hours as needed for wheezing or shortness of breath.    Marland Kitchen. atorvastatin (LIPITOR) 10 MG tablet Take 1 tablet (10 mg total) by mouth daily. 90 tablet 3  . carvedilol (COREG) 3.125 MG tablet Take 1 tablet (3.125 mg total) by mouth 2 (two) times daily with a meal. 60 tablet 5  . digoxin (LANOXIN) 0.125 MG tablet Take 1 tablet (0.125 mg total) by mouth daily. 90 tablet 3  . glipiZIDE (GLUCOTROL) 5 MG tablet Take 1 tablet (5 mg total) by mouth daily. 90 tablet 0  . ipratropium-albuterol (DUONEB) 0.5-2.5 (3) MG/3ML SOLN Take 3 mLs by nebulization every 4 (four) hours as needed. (Patient taking differently: Take 3  mLs by nebulization every 4 (four) hours as needed (for shortness of breath or wheezing). ) 360 mL 0  . ivabradine (CORLANOR) 5 MG TABS tablet Take 1 tablet (5 mg total) by mouth 2 (two) times daily with a meal. 60 tablet 5  . milrinone (PRIMACOR) 20 MG/100 ML SOLN infusion Inject 0.0414 mg/min into the vein continuous.    . potassium chloride SA (K-DUR) 20 MEQ tablet Take 2 tablets (40 mEq total) by mouth 2 (two) times daily. 120 tablet 6  . PRESCRIPTION MEDICATION CPAP- AT BEDTIME    . sacubitril-valsartan (ENTRESTO) 24-26 MG Take 1 tablet by mouth 2 (two) times daily. 60 tablet 5  . spironolactone (ALDACTONE) 25 MG tablet Take 1 tablet (25 mg total) by mouth daily. 90 tablet 3  . torsemide (DEMADEX) 20 MG tablet Take 4 tablets (80 mg total) by mouth 2 (two) times daily. 720 tablet 3   No current facility-administered medications for this encounter.     Allergies  Allergen Reactions  . Metformin And Related Diarrhea    And caused severe "dry mouth," also      Social History    Socioeconomic History  . Marital status: Single    Spouse name: Not on file  . Number of children: 2  . Years of education: Not on file  . Highest education level: Not on file  Occupational History  . Not on file  Social Needs  . Financial resource strain: Not very hard  . Food insecurity    Worry: Never true    Inability: Never true  . Transportation needs    Medical: Yes    Non-medical: Yes  Tobacco Use  . Smoking status: Former Smoker    Quit date: 10/19/2015    Years since quitting: 3.7  . Smokeless tobacco: Never Used  Substance and Sexual Activity  . Alcohol use: Yes    Alcohol/week: 0.0 standard drinks    Comment: occasionally  . Drug use: Yes    Types: Marijuana    Comment: only socially  . Sexual activity: Yes  Lifestyle  . Physical activity    Days per week: Not on file    Minutes per session: Not on file  . Stress: Not on file  Relationships  . Social Herbalist on phone: Not on file    Gets together: Not on file    Attends religious service: Not on file    Active member of club or organization: Not on file    Attends meetings of clubs or organizations: Not on file    Relationship status: Not on file  . Intimate partner violence    Fear of current or ex partner: Not on file    Emotionally abused: Not on file    Physically abused: Not on file    Forced sexual activity: Not on file  Other Topics Concern  . Not on file  Social History Narrative  . Not on file      Family History  Problem Relation Age of Onset  . Heart attack Mother   . Hyperlipidemia Father   . Diabetes Brother     Vitals:   07/07/19 1107  BP: 120/80  Pulse: 89  SpO2: 97%  Weight: (!) 167.8 kg (370 lb)     PHYSICAL EXAM: General:  Morbidly obese, young female. No respiratory difficulty HEENT: normal Neck: supple. no JVD. Carotids 2+ bilat; no bruits. No lymphadenopathy or thyromegaly appreciated. Cor: PMI nondisplaced. Regular rate & rhythm. No  rubs,  gallops or murmurs. Lungs: clear Abdomen: soft, nontender, nondistended. No hepatosplenomegaly. No bruits or masses. Good bowel sounds. Extremities: no cyanosis, clubbing, rash, edema Neuro: alert & oriented x 3, cranial nerves grossly intact. moves all 4 extremities w/o difficulty. Affect pleasant.  ECG: not performed.    ASSESSMENT & PLAN:  1. Chronic Systolic HF w/ Low Output/ NICM: Echo 06/2019 with EF < 20%, relatively normal RV, moderate MR. NICM with Medtronic ICD. Recent acute exacerbation in the setting of dietary indiscretion w/ sodium. Massive diuresis w/ IV Lasix, ~17 lb. D/c dry wt 357 lb.  RHC 10/5 with low output (CI 1.8) and elevated PCWP but near normal RA pressure, started on milrinone 0.25 w/ plans to continue home milrinone as bridge to possible LVAD if significant weight loss.  - Exercise tolerance has improved w/ Milrinone. NHYA class II. Wt is trending up and Optivol assessment c/w volume overload (reduced impedence below threshold, rising fluid index). - Increase torsemide to 80 mg bid. Check BMP today and have HH repeat in 7 days - Continue home milrinone w/ HH assistance   -Continue to monitor wt daily at home. HH and paramedicine to report any progressive wt gain.  - Continue Coreg 3.125 mg bid.  - Continue Entresto 24/26 bid.  - Continue ivabradine 5 mg bid.  - Continue spironolactone 25 daily.  - Continue digoxin 0.125 mg -BP too soft for further med titration - VAD consideration: Her size and social support will be issues.  She would not be a transplant candidate at this point due to weight. We discussed her at [facility], Dr. Donata Clay has seen.  She will need significant weight loss to get to LVAD.  Will use home milrinone to try to facilitate increased activity to work towards weight loss.  She understands that milrinone is not a good end-point and that we would like to use it as a bridge to eventual LVAD. RV function seems adequate for LVAD.   2. Morbid obesity:  Continue to work on weight loss.  This will be imperative as she cannot get to LVAD without weight loss.  -pt will be enrolled in weight loss program. Appreciate SW assistance. Pt seems motivated to lose wt   3. SVT: had ED visit 10/11 for SVT noted by EMS that converted back to NSR after dose of adenosine. This was in the setting of hypokalemia and hypomagnesemia, at 3.0 and 1.5 respectively. Also in the setting of inotropic therapy w/ milrinone.  -I have personally reviewed device interrogation today. No recurrent atrial arrhymias. No VT/VF events   -Check BMP and Mg today   F/u in 2 weeks for repeat assessment    Robbie Lis, PA-C 07/07/19

## 2019-07-07 ENCOUNTER — Other Ambulatory Visit (HOSPITAL_COMMUNITY): Payer: Self-pay

## 2019-07-07 ENCOUNTER — Other Ambulatory Visit: Payer: Self-pay

## 2019-07-07 ENCOUNTER — Encounter (HOSPITAL_COMMUNITY): Payer: Self-pay

## 2019-07-07 ENCOUNTER — Ambulatory Visit (HOSPITAL_COMMUNITY)
Admission: RE | Admit: 2019-07-07 | Discharge: 2019-07-07 | Disposition: A | Discharge status: Home / Self Care | Payer: Medicaid Other | Source: Ambulatory Visit | Attending: Adult Health | Admitting: Adult Health

## 2019-07-07 VITALS — BP 120/80 | HR 89 | Wt 370.0 lb

## 2019-07-07 DIAGNOSIS — Z8249 Family history of ischemic heart disease and other diseases of the circulatory system: Secondary | ICD-10-CM | POA: Insufficient documentation

## 2019-07-07 DIAGNOSIS — Z833 Family history of diabetes mellitus: Secondary | ICD-10-CM | POA: Diagnosis not present

## 2019-07-07 DIAGNOSIS — G4733 Obstructive sleep apnea (adult) (pediatric): Secondary | ICD-10-CM | POA: Diagnosis not present

## 2019-07-07 DIAGNOSIS — Z95810 Presence of automatic (implantable) cardiac defibrillator: Secondary | ICD-10-CM | POA: Diagnosis not present

## 2019-07-07 DIAGNOSIS — E1165 Type 2 diabetes mellitus with hyperglycemia: Secondary | ICD-10-CM | POA: Insufficient documentation

## 2019-07-07 DIAGNOSIS — Z87891 Personal history of nicotine dependence: Secondary | ICD-10-CM | POA: Insufficient documentation

## 2019-07-07 DIAGNOSIS — I428 Other cardiomyopathies: Secondary | ICD-10-CM | POA: Insufficient documentation

## 2019-07-07 DIAGNOSIS — I471 Supraventricular tachycardia: Secondary | ICD-10-CM | POA: Diagnosis not present

## 2019-07-07 DIAGNOSIS — Z79899 Other long term (current) drug therapy: Secondary | ICD-10-CM | POA: Diagnosis not present

## 2019-07-07 DIAGNOSIS — I5022 Chronic systolic (congestive) heart failure: Secondary | ICD-10-CM

## 2019-07-07 DIAGNOSIS — J45909 Unspecified asthma, uncomplicated: Secondary | ICD-10-CM | POA: Insufficient documentation

## 2019-07-07 DIAGNOSIS — Z7984 Long term (current) use of oral hypoglycemic drugs: Secondary | ICD-10-CM | POA: Diagnosis not present

## 2019-07-07 LAB — BASIC METABOLIC PANEL
Anion gap: 10 (ref 5–15)
BUN: 6 mg/dL (ref 6–20)
CO2: 26 mmol/L (ref 22–32)
Calcium: 8.5 mg/dL — ABNORMAL LOW (ref 8.9–10.3)
Chloride: 99 mmol/L (ref 98–111)
Creatinine, Ser: 0.77 mg/dL (ref 0.44–1.00)
GFR calc Af Amer: >60 mL/min (ref >60)
GFR calc non Af Amer: >60 mL/min (ref >60)
Glucose, Bld: 201 mg/dL — ABNORMAL HIGH (ref 70–99)
Potassium: 3.3 mmol/L — ABNORMAL LOW (ref 3.5–5.1)
Sodium: 135 mmol/L (ref 135–145)

## 2019-07-07 MED ORDER — ATORVASTATIN CALCIUM 10 MG PO TABS: 10.0000 mg | ORAL_TABLET | Freq: Every day | ORAL | 3 refills | Status: DC

## 2019-07-07 MED ORDER — TORSEMIDE 20 MG PO TABS: 80.0000 mg | ORAL_TABLET | Freq: Two times a day (BID) | ORAL | 3 refills | Status: DC

## 2019-07-07 NOTE — Patient Instructions (Signed)
Lab work done today. We will notify you of any abnormal lab work. No news is good news!  You will need to have labs drawn at home in 1 weeks. We will contact  [place] in order to set this up for you.   INCREASE Torsemide to 80mg  twice daily.  You have been referred to the PREP program. Someone will be in contact with you in order to schedule appointments.  Please follow up with the [facility] in 2 weeks.  At the [facility], you and your health needs are our priority. As part of our continuing mission to provide you with exceptional heart care, we have created designated Provider Care Teams. These Care Teams include your primary Cardiologist (physician) and Advanced Practice Providers (APPs- Physician Assistants and Nurse Practitioners) who all work together to provide you with the care you need, when you need it.   You may see any of the following providers on your designated Care Team at your next follow up: Marland Kitchen Dr Glori Bickers . Dr Loralie Champagne . Darrick Grinder, NP . Lyda Jester, PA   Please be sure to bring in all your medications bottles to every appointment.

## 2019-07-07 NOTE — Progress Notes (Signed)
Paramedicine Encounter   [name] , female,   DOB: 03-10-1981,38 y.o.,  MRN: [id number]   Met patient in clinic today with provider.   Pt states she is feeling much better. Her b/p better today.  Pt does have increased fluid. Her torsemide will be increased to 26m BID. She was advised if that did not work then to call someone to [place].  Kirsten Loftsis getting more info on the weight loss clinic and if there is an exercise program with it or just diet planning. If no exercise program then [facility]  Will f/u next week.   Weight @ clinic-370 P-89 [id number] B/P-120/80   KMarylouise Stacks [name]10/21/2020

## 2019-07-07 NOTE — Progress Notes (Signed)
CSW met with pt during clinic appt to provide support and check in regarding progress.  Pt feeling good and reports higher energy and endurance.  CSW and pt called weight loss clinic together so patient would be set up with virtual seminar which is required prior to patient's first appt- they sent pt link for the virtual seminar and pt to call clinic once that is completed.  CSW will continue to follow and assist as needed  Jorge Ny, [facility] Desk#: [phone] Cell#: [phone]

## 2019-07-08 ENCOUNTER — Telehealth (HOSPITAL_COMMUNITY): Payer: Self-pay

## 2019-07-08 MED ORDER — POTASSIUM CHLORIDE CRYS ER 20 MEQ PO TBCR: 60.0000 meq | EXTENDED_RELEASE_TABLET | Freq: Two times a day (BID) | ORAL | 3 refills | Status: DC

## 2019-07-08 NOTE — Telephone Encounter (Signed)
Called pt to review lab work and new med changes. Also called [facility] to add BMET orders for next week. Asked where the results for her weekly labs are being sent and asked that they also be sent to [place]. Verified our fax number. No further questions at this time.

## 2019-07-09 ENCOUNTER — Telehealth (HOSPITAL_COMMUNITY): Payer: Self-pay | Admitting: Licensed Clinical Social Worker

## 2019-07-09 ENCOUNTER — Telehealth (HOSPITAL_COMMUNITY): Payer: Self-pay

## 2019-07-09 MED ORDER — MAGNESIUM OXIDE -MG SUPPLEMENT 200 MG PO TABS: 200.0000 mg | ORAL_TABLET | Freq: Two times a day (BID) | ORAL | 4 refills | Status: DC

## 2019-07-09 NOTE — Telephone Encounter (Signed)
Labs 07/02/19 reviewed by NP, Mag 1.4. Per NP pt to start Mag Ox 200mg  BID.  Pt made aware of same. Verbalized understanding.

## 2019-07-09 NOTE — Telephone Encounter (Signed)
CSW called [employer] to check on PA status for pt infusion pump equipment.  [employer] states that the PA requests have been voided because the NPI number is not eligible to submit a PA for DME.  CSW confirmed NPI numbers for the billing and requesting providers and requested they submit an inquire into this- CSW to follow up next week regarding this.  Jorge Ny, LCSW Clinical Social Worker Advanced Heart Failure Clinic Desk#: [phone] Cell#: [phone]

## 2019-07-13 ENCOUNTER — Telehealth (HOSPITAL_COMMUNITY): Payer: Self-pay | Admitting: *Deleted

## 2019-07-13 ENCOUNTER — Telehealth (HOSPITAL_COMMUNITY): Payer: Self-pay | Admitting: Licensed Clinical Social Worker

## 2019-07-13 ENCOUNTER — Other Ambulatory Visit (HOSPITAL_COMMUNITY): Payer: Self-pay

## 2019-07-13 ENCOUNTER — Other Ambulatory Visit (HOSPITAL_COMMUNITY): Payer: Self-pay | Admitting: Adult Health

## 2019-07-13 ENCOUNTER — Other Ambulatory Visit (HOSPITAL_COMMUNITY): Payer: Self-pay | Admitting: *Deleted

## 2019-07-13 MED ORDER — METOLAZONE 2.5 MG PO TABS: 2.5000 mg | ORAL_TABLET | ORAL | 0 refills | Status: DC

## 2019-07-13 MED ORDER — POTASSIUM CHLORIDE CRYS ER 20 MEQ PO TBCR: 60.0000 meq | EXTENDED_RELEASE_TABLET | Freq: Two times a day (BID) | ORAL | 3 refills | Status: DC

## 2019-07-13 NOTE — Telephone Encounter (Signed)
Katie w/paramedicine called to report 5lb weight gain since thurs. Patient has swelling in one leg and coughing when she lays down. Patient has been watching her salt intake. Per [name] take metolazone 2.5mg  weekly with extra 73meq of potassium. Keep appt on 11/4 and get bmet during office visit. Katie aware and will follow up with patient.

## 2019-07-13 NOTE — Telephone Encounter (Signed)
Kirsten Wheeler w/paramedicine called to report 5lb weight gain since thurs. Patient has swelling in one leg and coughing when she lays down. Patient has been watching her salt intake. Per [name] take metolazone 2.5mg weekly with extra 40meq of potassium. Keep appt on 11/4 and get bmet during office visit. Kirsten Wheeler aware and will follow up with patient.  

## 2019-07-13 NOTE — Telephone Encounter (Addendum)
CSW spoke with pt this morning to check in.  Pt was able to finish online seminar for weight loss clinic yesterday so now has an initial appt on Monday.  CSW continuing to offer support for patient who is anxious to get the weight off so she can see if she is eligible for LVAD surgery.  Pt also concerns with getting LVAD then not being eligible for transplant.  CSW discussed with patient and acknowledged that transplant requirements are tough but she is also capable of them if that's what she wants.  Also encouraged patient to consider her other goals like seeing her daughters graduate high school and how the LVAD could help her reach those goals.  Pt still struggling with these thoughts but is trying to think about things rationally.  Pt wanting to do a liquid diet with meals replaced with protein shakes- CSW discussed with providers who think this is ok to try but that she needs to be hyper aware of any fluid retention and her sodium intake- relayed to patient who plans to start this next week.  Pt reports she is feeling a little bit like she has fluid on her now- she was coughing last night and legs felt a little tight but feels better today.  States she had her meds at 8:30 this morning and has peed once between then and around 10am.  States she is up 5lbs from Thursday of last week- reports she has been keeping the scale in the same location.  CSW called paramedic and requested they go out to see her this afternoon.  CSW will continue to follow and support as needed  Jorge Ny, [facility] Desk#: [phone] Cell#: [phone]

## 2019-07-13 NOTE — Progress Notes (Signed)
Paramedicine Encounter    [name], female    DOB: 06/19/1981, 38 y.o.   MRN: [id number]   Patient Care Team: Otila Back as PCP - General (Internal Medicine) Regan Lemming, MD as PCP - Electrophysiology (Cardiology) Laurey Morale, MD as PCP - Advanced Heart Failure (Cardiology)  Patient Active Problem List   Diagnosis Date Noted  . Acute on chronic systolic CHF (congestive heart failure) (HCC) 06/17/2019  . Obstructive sleep apnea 06/17/2019  . 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 Diabetes mellitus (HCC) 06/17/2019  . Amenorrhea 08/03/2018  . Congestive heart failure (HCC) 06/29/2018  . Hypotension 06/07/2018  . Rhinovirus infection 06/06/2018  . Asthma 06/05/2018  . Acute respiratory failure with hypoxia (HCC) 06/05/2018  . Hypokalemia 06/05/2018  . Snoring 09/23/2016  . Cough 04/10/2016  . 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 Tobacco abuse 01/25/2016  . Acute on chronic combined systolic and diastolic CHF (congestive heart failure) (HCC) 01/25/2016  . Mitral regurgitation   . Abnormal EKG-inf TWI 01/15/2016  . Positive D dimer-CTA neg for PE 01/15/2016  . Morbid obesity- BMI 57 01/14/2016  . Abdominal pain 01/14/2016  . 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 At risk for sleep apnea 01/14/2016  . Cardiomyopathy (HCC) 11/01/2015  . Morbid obesity with BMI of 50.0-59.9, adult (HCC) 08/16/2015    Current Outpatient Medications:  .  albuterol (PROAIR HFA) 108 (90 Base) MCG/ACT inhaler, Inhale 2 puffs into the lungs every 6 (six) hours as needed for wheezing or shortness of breath., Disp: , Rfl:  .  atorvastatin (LIPITOR) 10 MG tablet, Take 1 tablet (10 mg total) by mouth daily., Disp: 90 tablet, Rfl: 3 .  carvedilol (COREG) 3.125 MG tablet, Take 1 tablet (3.125 mg total) by mouth 2 (two) times daily with a meal., Disp: 60 tablet, Rfl: 5 .  digoxin (LANOXIN) 0.125 MG tablet, Take 1 tablet (0.125  mg total) by mouth daily., Disp: 90 tablet, Rfl: 3 .  glipiZIDE (GLUCOTROL) 5 MG tablet, Take 1 tablet (5 mg total) by mouth daily., Disp: 90 tablet, Rfl: 0 .  ipratropium-albuterol (DUONEB) 0.5-2.5 (3) MG/3ML SOLN, Take 3 mLs by nebulization every 4 (four) hours as needed. (Patient taking differently: Take 3 mLs by nebulization every 4 (four) hours as needed (for shortness of breath or wheezing). ), Disp: 360 mL, Rfl: 0 .  ivabradine (CORLANOR) 5 MG TABS tablet, Take 1 tablet (5 mg total) by mouth 2 (two) times daily with a meal., Disp: 60 tablet, Rfl: 5 .  milrinone (PRIMACOR) 20 MG/100 ML SOLN infusion, Inject 0.0414 mg/min into the vein continuous., Disp:  , Rfl:  .  PRESCRIPTION MEDICATION, CPAP- AT BEDTIME, Disp: , Rfl:  .  sacubitril-valsartan (ENTRESTO) 24-26 MG, Take 1 tablet by mouth 2 (two) times daily., Disp: 60 tablet, Rfl: 5 .  spironolactone (ALDACTONE) 25 MG tablet, Take 1 tablet (25 mg total) by mouth daily., Disp: 90 tablet, Rfl: 3 .  torsemide (DEMADEX) 20 MG tablet, Take 4 tablets (80 mg total) by mouth 2 (two) times daily., Disp: 720 tablet, Rfl: 3 .  Magnesium Oxide 200 MG TABS, Take 1 tablet (200 mg total) by mouth 2 (two) times daily. (Patient not taking: Reported on 07/13/2019), Disp: 60 tablet, Rfl: 4 .  metolazone (ZAROXOLYN) 2.5 MG tablet, Take 1 tablet (2.5 mg total) by mouth once a week., Disp: 10 tablet, Rfl: 0 .  potassium chloride SA (KLOR-CON) 20 MEQ tablet, Take 3 tablets (60 mEq total) by mouth  2 (two) times daily. Take an additional with metolazone., Disp: 540 tablet, Rfl: 3 Allergies  Allergen Reactions  . Metformin And Related Diarrhea    And caused severe "dry mouth," also      Social History   Socioeconomic History  . Marital status: Single    Spouse name: Not on file  . Number of children: 2  . Years of education: Not on file  . Highest education level: Not on file  Occupational History  . Not on file  Social Needs  . Financial resource  strain: Not very hard  . Food insecurity    Worry: Never true    Inability: Never true  . Transportation needs    Medical: Yes    Non-medical: Yes  Tobacco Use  . Smoking status: Former Smoker    Quit date: 10/19/2015    Years since quitting: 3.7  . Smokeless tobacco: Never Used  Substance and Sexual Activity  . Alcohol use: Yes    Alcohol/week: 0.0 standard drinks    Comment: occasionally  . Drug use: Yes    Types: Marijuana    Comment: only socially  . Sexual activity: Yes  Lifestyle  . Physical activity    Days per week: Not on file    Minutes per session: Not on file  . Stress: Not on file  Relationships  . Social Musician on phone: Not on file    Gets together: Not on file    Attends religious service: Not on file    Active member of club or organization: Not on file    Attends meetings of clubs or organizations: Not on file    Relationship status: Not on file  . Intimate partner violence    Fear of current or ex partner: Not on file    Emotionally abused: Not on file    Physically abused: Not on file    Forced sexual activity: Not on file  Other Topics Concern  . Not on file  Social History Narrative  . Not on file    Physical Exam      Future Appointments  Date Time Provider Department Center  07/21/2019 10:00 AM [facility] PA/NP [facility] None  07/26/2019  7:45 AM [facility] DEVICE REMOTES [facility]  08/06/2019  9:00 AM Jobe, Rolin Barry, RD [facility] NDM  10/25/2019  7:45 AM [facility] DEVICE REMOTES [facility]  01/24/2020  7:45 AM [facility] DEVICE REMOTES [facility]  04/24/2020  7:45 AM [facility] DEVICE REMOTES [facility]  07/24/2020  7:45 AM [facility] DEVICE REMOTES [facility]    BP (!) 84/0   Pulse 86   Temp 98 F (36.7 C)   Resp 18   Wt (!) 370 lb (167.8 kg)   LMP 06/24/2019   SpO2 98%   BMI 61.57 kg/m  B/p standing-80/ systolic    Weight yesterday-370 Last  visit weight-370 @ clinic   Pt had spoken to jenna this morning and pt had reported that she had started with a cough last night that she noticed was different. Her weight is at 370.  At her last clinic visit her torsemide was increased to 80mg  BID. She noticed a small difference of urine output, but not really. She feels like she is becoming immune to the torsemide. She feels like she has more swelling to the left leg than the right.  She is eating a tuna wrap that she made at home.  She was put on magnesium supplement as well.  She hasnt picked it up yet and has to wait until she gets more money.  She gets it from [employer].   She states her diabetic doc put her on bydureon and was given to her on Friday in the office but she has not been able to pick it from pharmacy yet.   Reviewed her diet and it doesn't sound like she ate anything with more sodium in it, fluid intake maybe a small amount over the limit but not really a lot.  Spoke to nurse at clinic and they want her to take a metolazone in the morning--due to the lateness in the day along with an extra 74meq of potassium.  Pt is agreeable to plan.   [name], [facility] Paramedic  07/15/19

## 2019-07-14 ENCOUNTER — Other Ambulatory Visit (HOSPITAL_COMMUNITY): Payer: Self-pay | Admitting: Adult Health

## 2019-07-14 ENCOUNTER — Telehealth (HOSPITAL_COMMUNITY): Payer: Self-pay | Admitting: *Deleted

## 2019-07-14 NOTE — Telephone Encounter (Signed)
Contacted patient per referral from Social Work regarding patient's concerns surrounding her physical activity and weight loss. Expressed the nature of the call and explained the referral and agreed with the plan to discuss exercise recommendations and weekly follow-up with a coaching style to help patient to achieve goals and improve quality of life. All with consideration to her clinical limitations and considerations.    Overall: Kirsten Wheeler has a clear understanding of her health now, where she was caught by surprise by this recent decline in her heart failure. She stated that she does not deal with her discouragement and she feels this put her deeper into a hole and she does not feel motivated to pursue the goals and actions required to achieve her goal to improved quality of life. She wants to lose more than 50lbs as she is very nervous for the outcome of any of the advanced therapy surgeries, she may become a candidate. She stated she is currently seeing a therapist, but that she usually discusses her relationship with her daughter. She feels as though she is being selfish and that she realizes she needs to take care of herself before she can take care of anyone else. Although, she has times when she wants to give up. We explored areas of motivation and positive feedback. When she is discouraged, she says she moves through it and does not run away from it. She says she prays and leans on God. She "goes through it" and follows the direction.   Current activity: Walking daily in the evening after dinner for less than 15 minutes, 2-3 laps around her appointment complex.   Current Limitations: recent hospitalization and shortness of breath   Current Habits: she doesn't eat after 8pm so she walks after dinner  Values/priorities: she says that no one can take care of her children the way she takes care of them and that no one else can love them they way she does. They do not have anyone else. She wants them  nearby and depends on them almost as much as they depend on her. She says one of her earnest prayers "Lord please don't take me when my kids are still young, please let me have time to teach them how to be okay on their own".   Goals: To lose weight to get the advanced therapies needed to survive to fully continue raising her children. She wants to increase her exercise in a safe way (given weak heart and defibrillator) to assist in her weight loss journey  Action Plan: Continue walking after dinner 3-4 laps and 15-20 minutes daily and will log how many laps and the time it took to complete. She will also document when you are unable to exercise and reflect the positive and the negative behind that.   Next Session: November 4th, Wednesday vis Telephone Visit      Landis Martins, MS, ACSM-RCEP Clinical Exercise Physiologist

## 2019-07-15 ENCOUNTER — Telehealth (HOSPITAL_COMMUNITY): Payer: Self-pay

## 2019-07-15 NOTE — Telephone Encounter (Signed)
I contacted pt regarding that metolazone and she did pick it up. She took it yesterday morning and her weight this morning is 359 lbs.   [name], EMT-Paramedic  07/15/19

## 2019-07-15 NOTE — Progress Notes (Signed)
I went ahead and and picked up meds from pharmacy. They give me numerous medications.  However they did not give me the metolazone that was placed today. It was my fault for not checking before I left. I called pharmacy once I dropped off her meds to her and they did not even have it ready. Pharmacy said they sent it back to clinic for a 90 day supply where clinic sent in 10 tablets. So it was only 2 tablets difference. I told pharmacy to go ahead and fill with what was sent as she needs it ASAP. pharamcy advised it would be at least an hour before it was ready. Due to time being past 6pm I was not able to wait around and go back and pick it up for her. She advised she could get it in the morning.   [name], EMT-Paramedic  07/13/2019

## 2019-07-20 ENCOUNTER — Other Ambulatory Visit (HOSPITAL_COMMUNITY): Payer: Self-pay

## 2019-07-20 ENCOUNTER — Other Ambulatory Visit: Payer: Self-pay

## 2019-07-20 ENCOUNTER — Encounter (HOSPITAL_COMMUNITY): Payer: Self-pay

## 2019-07-20 ENCOUNTER — Ambulatory Visit (HOSPITAL_COMMUNITY)
Admission: RE | Admit: 2019-07-20 | Discharge: 2019-07-20 | Disposition: A | Discharge status: Home / Self Care | Payer: Medicaid Other | Source: Ambulatory Visit | Attending: Adult Health | Admitting: Adult Health

## 2019-07-20 ENCOUNTER — Telehealth (HOSPITAL_COMMUNITY): Payer: Self-pay | Admitting: *Deleted

## 2019-07-20 VITALS — BP 99/74 | HR 75 | Wt 368.2 lb

## 2019-07-20 DIAGNOSIS — E108 Type 1 diabetes mellitus with unspecified complications: Secondary | ICD-10-CM

## 2019-07-20 DIAGNOSIS — E876 Hypokalemia: Secondary | ICD-10-CM | POA: Diagnosis not present

## 2019-07-20 DIAGNOSIS — Z79899 Other long term (current) drug therapy: Secondary | ICD-10-CM | POA: Insufficient documentation

## 2019-07-20 DIAGNOSIS — Z87891 Personal history of nicotine dependence: Secondary | ICD-10-CM | POA: Insufficient documentation

## 2019-07-20 DIAGNOSIS — Z888 Allergy status to other drugs, medicaments and biological substances status: Secondary | ICD-10-CM | POA: Diagnosis not present

## 2019-07-20 DIAGNOSIS — Z833 Family history of diabetes mellitus: Secondary | ICD-10-CM | POA: Diagnosis not present

## 2019-07-20 DIAGNOSIS — I471 Supraventricular tachycardia: Secondary | ICD-10-CM | POA: Diagnosis not present

## 2019-07-20 DIAGNOSIS — I428 Other cardiomyopathies: Secondary | ICD-10-CM | POA: Diagnosis not present

## 2019-07-20 DIAGNOSIS — J45909 Unspecified asthma, uncomplicated: Secondary | ICD-10-CM | POA: Diagnosis not present

## 2019-07-20 DIAGNOSIS — Z7984 Long term (current) use of oral hypoglycemic drugs: Secondary | ICD-10-CM | POA: Insufficient documentation

## 2019-07-20 DIAGNOSIS — Z8249 Family history of ischemic heart disease and other diseases of the circulatory system: Secondary | ICD-10-CM | POA: Insufficient documentation

## 2019-07-20 DIAGNOSIS — Z6841 Body Mass Index (BMI) 40.0 and over, adult: Secondary | ICD-10-CM | POA: Diagnosis not present

## 2019-07-20 DIAGNOSIS — I5022 Chronic systolic (congestive) heart failure: Secondary | ICD-10-CM

## 2019-07-20 DIAGNOSIS — G4730 Sleep apnea, unspecified: Secondary | ICD-10-CM | POA: Diagnosis not present

## 2019-07-20 DIAGNOSIS — E1165 Type 2 diabetes mellitus with hyperglycemia: Secondary | ICD-10-CM | POA: Insufficient documentation

## 2019-07-20 DIAGNOSIS — G4733 Obstructive sleep apnea (adult) (pediatric): Secondary | ICD-10-CM | POA: Diagnosis not present

## 2019-07-20 DIAGNOSIS — I340 Nonrheumatic mitral (valve) insufficiency: Secondary | ICD-10-CM | POA: Insufficient documentation

## 2019-07-20 LAB — BASIC METABOLIC PANEL
Anion gap: 14 (ref 5–15)
BUN: 10 mg/dL (ref 6–20)
CO2: 25 mmol/L (ref 22–32)
Calcium: 8.8 mg/dL — ABNORMAL LOW (ref 8.9–10.3)
Chloride: 98 mmol/L (ref 98–111)
Creatinine, Ser: 0.77 mg/dL (ref 0.44–1.00)
GFR calc Af Amer: >60 mL/min (ref >60)
GFR calc non Af Amer: >60 mL/min (ref >60)
Glucose, Bld: 165 mg/dL — ABNORMAL HIGH (ref 70–99)
Potassium: 3.2 mmol/L — ABNORMAL LOW (ref 3.5–5.1)
Sodium: 137 mmol/L (ref 135–145)

## 2019-07-20 LAB — CBC
HCT: 38.4 % (ref 36.0–46.0)
Hemoglobin: 13.2 g/dL (ref 12.0–15.0)
MCH: 27.7 pg (ref 26.0–34.0)
MCHC: 34.4 g/dL (ref 30.0–36.0)
MCV: 80.5 fL (ref 80.0–100.0)
Platelets: 244 10*3/uL (ref 150–400)
RBC: 4.77 MIL/uL (ref 3.87–5.11)
RDW: 13.3 % (ref 11.5–15.5)
WBC: 8 10*3/uL (ref 4.0–10.5)
nRBC: 0 % (ref 0.0–0.2)

## 2019-07-20 LAB — MAGNESIUM: Magnesium: 1.8 mg/dL (ref 1.7–2.4)

## 2019-07-20 MED ORDER — POTASSIUM CHLORIDE CRYS ER 20 MEQ PO TBCR: 60.0000 meq | EXTENDED_RELEASE_TABLET | Freq: Three times a day (TID) | ORAL | 3 refills | Status: DC

## 2019-07-20 MED ORDER — MAGNESIUM OXIDE -MG SUPPLEMENT 400 (240 MG) MG PO TABS: 400.0000 mg | ORAL_TABLET | Freq: Two times a day (BID) | ORAL | 6 refills | Status: DC

## 2019-07-20 NOTE — Patient Instructions (Signed)
Labs done today, we will let you know if you need to make any changes to your medications  Your physician recommends that you schedule a follow-up appointment in: 4 weeks  If you have any questions or concerns before your next appointment please send [place] a message through [place] or call our office at [phone].  At the [facility], you and your health needs are our priority. As part of our continuing mission to provide you with exceptional heart care, we have created designated Provider Care Teams. These Care Teams include your primary Cardiologist (physician) and Advanced Practice Providers (APPs- Physician Assistants and Nurse Practitioners) who all work together to provide you with the care you need, when you need it.   You may see any of the following providers on your designated Care Team at your next follow up: Marland Kitchen Dr Glori Bickers . Dr Loralie Champagne . Darrick Grinder, NP . Lyda Jester, PA   Please be sure to bring in all your medications bottles to every appointment.

## 2019-07-20 NOTE — Addendum Note (Signed)
Encounter addended by: Jorge Ny, LCSW on: 07/20/2019 2:52 PM  Actions taken: Clinical Note Signed

## 2019-07-20 NOTE — Progress Notes (Signed)
CSW met with pt during clinic visit.  Discussed her current program and feelings of anxiety regarding exercise and her not being able to control her breathing if she starts getting SOB.  CSW provided active listening and support for her concerns.    CSW will continue to follow and assist as needed  Jorge Ny, [facility] Desk#: [phone] Cell#: [phone]

## 2019-07-20 NOTE — Telephone Encounter (Signed)
-----   Message from Conrad [place], NP sent at 07/20/2019  4:03 PM EST ----- Please call. Increase potassium to 60 meq three times a day. Increase Mag Oxide to 400 mg twice a day. K and Mag low today. Hgb WBC stable.

## 2019-07-20 NOTE — Progress Notes (Signed)
Advanced Heart Failure Clinic Note   Referring Physician: PCP: Eunice Blase'Buch, Greta, PA-C PCP-Cardiologist: Dr. Shirlee LatchMcLean   Reason for Visit: Heart Failure  HPI: 38 y/o female with super morbid obesity, DM2, OSA, chronic systolic HF due to severe NICM (EF ~20%), and medtronic  ICD.   She presented to the ER on 06/17/19 with several days of worsening HF with SOB at rest, marked edema and orthopnea/PND, not responding to increased doses of oral diuretics. She admitted to high levels of water and Gatorade intake.   Her admit wt was 380 lb (Dry wt ~360 lb). She was started on IV Lasix but had initial poor urinary response. PO metolazone added as adjunct. Repeat echo was obtained and showed EF <20%, relatively normal RV, moderate MR. RHC 10/5 with low output (CI 1.8) and elevated PCWP but near normal RA pressure. She was started on inotropic therapy w/ milrinone for low output, 0.25 mcg/kg/min. Also treated w/ Entresto, Coreg, spironolactone, digoxin and ivabradine. She was later transitioned off of IV Lasix to po torsemide 80 mg qam/60 mg qpm.   Unfortunately, she was not a transplant candidate due to her morbid obesity. [facility] Dr. Donata ClayVan Trigt has seen. She will need significant weight loss to get to LVAD. Will use home milrinone to try to facilitate increased activity to work towards weight loss. She understands that milrinone is not a good end-point and that we would like to use it as a bridge to eventual LVAD. RV function seems adequate for LVAD. Pt is motivated to lose weight. Tunnel cath was placed for home milrinone and home health was arranged. She was discharged home on 10/9 on milrinone 0.25 mcg.   [facility] of palpitations. Was brought in by EMS and was reportedly in SVT with HR in the 180s. She was given adenosine and converted to NSR prior to arrival to the ED. In the ED she was stable and symptoms had resolved. Labs showed normal CBC. BMP  showed hypokalemia (3.0), hyperglycemia (243), mildly elevated BUN/SCr, and low Mag, 1.5. She was given K and Mg supplementation and discharged home from the ED.   Today she returns for HF follow up. Last visit torsemide was increased to 80 mg twice a day. Overall feeling fine. Able to do more around the house and can walk 30 minutes at a time. SOB with moderate exertion. Denies PND/Orthopnea. Appetite ok. No fever or chills. Weight at home 349-351 pounds. No fever or chills.  Taking all medications. Remains on milrinone 0.25 mcg. [facility].    RHC Procedural Findings (10/5): Hemodynamics (mmHg) RA mean 7 RV 49/10 PA 65/25 mean 45 PCWP mean 27 Oxygen saturations: PA 57% AO 100% Cardiac Output (Fick) 4.65  Cardiac Index (Fick) 1.82 PVR 3.87 WU PAPI 5.7  2D Echo 06/18/19  Left ventricular ejection fraction, by visual estimation, is <20%. The left ventricle has severely decreased function. Severely increased left ventricular size. There is no left ventricular hypertrophy. Severe global hypokinesis. 2. Left ventricular diastolic Doppler parameters are indeterminate pattern of LV diastolic filling. 3. Global right ventricle has low normal systolic function.The right ventricular size is normal. No increase in right ventricular wall thickness. 4. Left atrial size was severely dilated. 5. Right atrial size was mildly dilated. 6. The mitral valve is abnormal. Moderate mitral valve regurgitation. 7. The tricuspid valve is grossly normal. Tricuspid valve regurgitation is trivial. 8. The aortic valve is tricuspid  Aortic valve regurgitation was not visualized by color flow Doppler. 9. The pulmonic valve was grossly normal. Pulmonic valve regurgitation is not visualized by color flow Doppler. 10. Mildly elevated pulmonary artery systolic pressure. 11. A pacer wire is visualized. 12. The inferior vena cava is dilated in size with >50% respiratory variability,  suggesting right atrial pressure of 8 mmHg. 13. The interatrial septum was not well visualized.    Past Medical History:  Diagnosis Date  . Asthma   . Chronic systolic CHF (congestive heart failure) (HCC)    a. ECHO 01/15/2016 EF 10-15%. 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  . Diabetes mellitus (HCC) 06/17/2019  . Hearing loss    bilateral  . Mitral regurgitation    a. severe, felt to be functional 2/2 LV dilation   . Morbid obesity (HCC)   . Snoring     Current Outpatient Medications  Medication Sig Dispense Refill  . albuterol (PROAIR HFA) 108 (90 Base) MCG/ACT inhaler Inhale 2 puffs into the lungs every 6 (six) hours as needed for wheezing or shortness of breath.    Marland Kitchen atorvastatin (LIPITOR) 10 MG tablet Take 1 tablet (10 mg total) by mouth daily. 90 tablet 3  . carvedilol (COREG) 3.125 MG tablet Take 1 tablet (3.125 mg total) by mouth 2 (two) times daily with a meal. 60 tablet 5  . digoxin (LANOXIN) 0.125 MG tablet Take 1 tablet (0.125 mg total) by mouth daily. 90 tablet 3  . glipiZIDE (GLUCOTROL) 5 MG tablet Take 1 tablet (5 mg total) by mouth daily. 90 tablet 0  . ipratropium-albuterol (DUONEB) 0.5-2.5 (3) MG/3ML SOLN Take 3 mLs by nebulization every 4 (four) hours as needed. (Patient taking differently: Take 3 mLs by nebulization every 4 (four) hours as needed (for shortness of breath or wheezing). ) 360 mL 0  . ivabradine (CORLANOR) 5 MG TABS tablet Take 1 tablet (5 mg total) by mouth 2 (two) times daily with a meal. 60 tablet 5  . Magnesium Oxide 200 MG TABS Take 1 tablet (200 mg total) by mouth 2 (two) times daily. 60 tablet 4  . metolazone (ZAROXOLYN) 2.5 MG tablet Take 1 tablet (2.5 mg total) by mouth once a week. 10 tablet 0  . milrinone (PRIMACOR) 20 MG/100 ML SOLN infusion Inject 0.0414 mg/min into the vein continuous.    . potassium chloride SA (KLOR-CON) 20 MEQ tablet Take 3 tablets (60 mEq total) by mouth 2 (two) times daily. Take an additional with metolazone.  540 tablet 3  . PRESCRIPTION MEDICATION CPAP- AT BEDTIME    . sacubitril-valsartan (ENTRESTO) 24-26 MG Take 1 tablet by mouth 2 (two) times daily. 60 tablet 5  . spironolactone (ALDACTONE) 25 MG tablet Take 1 tablet (25 mg total) by mouth daily. 90 tablet 3  . torsemide (DEMADEX) 20 MG tablet Take 4 tablets (80 mg total) by mouth 2 (two) times daily. 720 tablet 3   No current facility-administered medications for this encounter.     Allergies  Allergen Reactions  . Metformin And Related Diarrhea    And caused severe "dry mouth," also      Social History   Socioeconomic History  . Marital status: Single    Spouse name: Not on file  . Number of children: 2  . Years of education: Not on file  . Highest education level: Not on file  Occupational History  . Not on file  Social Needs  . Financial resource strain: Not very hard  . Food  insecurity    Worry: Never true    Inability: Never true  . Transportation needs    Medical: Yes    Non-medical: Yes  Tobacco Use  . Smoking status: Former Smoker    Quit date: 10/19/2015    Years since quitting: 3.7  . Smokeless tobacco: Never Used  Substance and Sexual Activity  . Alcohol use: Yes    Alcohol/week: 0.0 standard drinks    Comment: occasionally  . Drug use: Yes    Types: Marijuana    Comment: only socially  . Sexual activity: Yes  Lifestyle  . Physical activity    Days per week: Not on file    Minutes per session: Not on file  . Stress: Not on file  Relationships  . Social Herbalist on phone: Not on file    Gets together: Not on file    Attends religious service: Not on file    Active member of club or organization: Not on file    Attends meetings of clubs or organizations: Not on file    Relationship status: Not on file  . Intimate partner violence    Fear of current or ex partner: Not on file    Emotionally abused: Not on file    Physically abused: Not on file    Forced sexual activity: Not on file   Other Topics Concern  . Not on file  Social History Narrative  . Not on file      Family History  Problem Relation Age of Onset  . Heart attack Mother   . Hyperlipidemia Father   . Diabetes Brother     Vitals:   07/20/19 1211  BP: 99/74  Pulse: 75  SpO2: 98%  Weight: (!) 167 kg (368 lb 3.2 oz)   Wt Readings from Last 3 Encounters:  07/20/19 (!) 167 kg (368 lb 3.2 oz)  07/13/19 (!) 167.8 kg (370 lb)  07/07/19 (!) 167.8 kg (370 lb)    PHYSICAL EXAM: General:  No resp difficulty HEENT: normal Neck: supple. no JVD. Carotids 2+ bilat; no bruits. No lymphadenopathy or thryomegaly appreciated. Cor: PMI nondisplaced. Regular rate & rhythm. No rubs, gallops or murmurs. R upper chest tunneled catheter.  Lungs: clear Abdomen: obese, soft, nontender, nondistended. No hepatosplenomegaly. No bruits or masses. Good bowel sounds. Extremities: no cyanosis, clubbing, rash, edema Neuro: alert & orientedx3, cranial nerves grossly intact. moves all 4 extremities w/o difficulty. Affect pleasant  ASSESSMENT & PLAN: 1. Chronic Systolic HF w/ Low Output/ NICM: Echo 06/2019 with EF < 20%, relatively normal RV, moderate MR. NICM with Medtronic ICD. Recent acute exacerbation in the setting of dietary indiscretion w/ sodium. Massive diuresis w/ IV Lasix, ~17 lb. D/c dry wt 357 lb.  RHC 10/5 with low output (CI 1.8) and elevated PCWP but near normal RA pressure, started on milrinone 0.25 w/ plans to continue home milrinone as bridge to possible LVAD if significant weight loss.  - NYHA II on milrinone therapy.  - Volume status stable. Continue torsemide 80 mg twice a day. Continue 60 meq potassium twice a day. Can adjust k based on lab results.  - Check BMET, mag, cbc today.  -  Continue Coreg 3.125 mg bid.  - Continue Entresto 24/26 bid.  - Continue ivabradine 5 mg bid.  - Continue spironolactone 25 daily.  - Continue digoxin 0.125 mg - VAD consideration: Her size and social support will be  issues.  She would not be a transplant candidate at  this point due to weight. We discussed her at [facility], Dr. Donata Clay has seen.  She will need significant weight loss to get to LVAD.  Will use home milrinone to try to facilitate increased activity to work towards weight loss.  She understands that milrinone is not a good end-point and that we would like to use it as a bridge to eventual LVAD. RV function seems adequate for LVAD.   2. Morbid obesity:  Body mass index is 61.27 kg/m. Started weight loss program. Has follow up later this week.  - Discussed portion control.   3. SVT: had ED visit 10/11 for SVT noted by EMS that converted back to NSR after dose of adenosine. This was in the setting of hypokalemia and hypomagnesemia, at 3.0 and 1.5 respectively. Also in the setting of inotropic therapy w/ milrinone.  Mag has been low. Check Mag today.   4. OSA Continue CPAP.   5. DMII Per PCP. Consider jardiance next visit.   Follow up in 4 weeks. Discussed HFSW and HF Paramedicine.  Check BMET, CBC, Mag today.   Tonye Becket, NP 07/20/19

## 2019-07-20 NOTE — Telephone Encounter (Signed)
Notes recorded by Scarlette Calico, RN on 07/20/2019 at 4:58 PM EST  Unable to reach pt, have spoken w/Katie, community paramedic, she will get in touch with pt and advise her of med changes, new prescriptions sent into pharmacy.

## 2019-07-20 NOTE — Progress Notes (Signed)
Paramedicine Encounter   [name] , female,   DOB: March 04, 1981,38 y.o.,  MRN: [id number]   Met patient in clinic today with provider.   B/p-99/74 p-75 Weight @ clinic-368 Weight @ home-349-351 sp02-98%  Pt reports doing ok. Denies sob. I came in on the end of a conversation about her getting into disagreement with daughter that caused her to get upset not sure when that was. She is going to call pharmacy about the byrudeon injuections when she leaves the clinic.  No med changes at this time. Will f/ next week.  She has appoint with weight clinic this week for more plans.   [name], [place] 07/20/2019

## 2019-07-21 ENCOUNTER — Telehealth (HOSPITAL_COMMUNITY): Payer: Self-pay | Admitting: *Deleted

## 2019-07-21 ENCOUNTER — Telehealth (HOSPITAL_COMMUNITY): Payer: Self-pay

## 2019-07-21 ENCOUNTER — Encounter (HOSPITAL_COMMUNITY): Payer: Medicaid Other

## 2019-07-21 NOTE — Telephone Encounter (Signed)
Week 2 of Exercise and Wellness Coaching   Last week's goal: Continue walking after dinner 3-4 laps and 15-20 minutes daily and will log how many laps and the time it took to complete. She will also document when unable to exercise and reflect the positive and the negative behind that.    Progress Note:  - Patient bought a journal (Nov 1st) "make today great" - Writing it in daily at night with her frustrations, challenges and areas of weakness with conclusions to positive self-talk.  - We discussed her exercise goal from the last week and she stated it takes 20 minutes for her walk 4 laps.  - She says she usually has a lot of groceries and has to take them up to the second floor and she takes one load and her children bring up the rest.  - Her provider wants her moving 2 hours a day. She wants some intentional exercise time and to increase her movement throughout the day   Achievements: - Daily journaling  - She is proud of her cooking and being mindful in her recipes  - She says she ate more vegetables than meat and felt proud of this  - She was proud that she was able to make a meal for her children and modifying it for her own nutrition/diet restrictions    Challenges/Frustrations: - Eating candy around Halloween when she has been avoiding it for a while  - She loves candy and is struggling with cravings  - She beat herself up over having the candy and journaled this and reset to move forward.    New Weekly Goal (s): - CEP email RPE scale for patient reference  - Increase movement throughout the day - Take an intentional 30 minutes 5 days a week (RPE 11-13)  - Make more than 1 trip to get the groceries out of the car  - Continue Journaling daily and reporting in weekly sessions with E&W Coach (RCEP, Cyril Mourning)  - Weight loss appointment tomorrow and patient will email CEP with review of plan     Next Session:   One week from today: Wednesday 11/11 (via Telephone Encounter)

## 2019-07-21 NOTE — Telephone Encounter (Signed)
Pt got back in touch with me this morning and said she was napping when we all tried reaching her yesterday afternoon-I relayed to her the med changes per clinic and she needs to  increase potassium to 74meq TID and magnesium 400mg  BID.   [name], EMT-Paramedic  07/21/19

## 2019-07-22 ENCOUNTER — Telehealth (HOSPITAL_COMMUNITY): Payer: Self-pay | Admitting: Licensed Clinical Social Worker

## 2019-07-22 NOTE — Telephone Encounter (Signed)
CSW contacted patient to provide support and encourage attendance at the upcoming cooking class via zoom. Patient states she is on her way to the weight loss clinic and "feeling nervous". CSW provided support and encouragement. CSW explained about the upcoming cooking class and patient very interested. CSW will forward the invite. Patient appears motivated for weight loss and healthy living. CSW and team will continue to provide support and encouragement. Raquel Sarna, [place], [place]

## 2019-07-26 ENCOUNTER — Encounter: Payer: Medicaid Other | Admitting: *Deleted

## 2019-07-28 ENCOUNTER — Telehealth: Payer: Self-pay

## 2019-07-28 ENCOUNTER — Ambulatory Visit: Payer: Medicaid Other | Admitting: *Deleted

## 2019-07-28 NOTE — Telephone Encounter (Signed)
Spoke with patient to remind of missed remote transmission LL 

## 2019-08-02 ENCOUNTER — Telehealth (HOSPITAL_COMMUNITY): Payer: Self-pay | Admitting: Pharmacist

## 2019-08-02 ENCOUNTER — Telehealth (HOSPITAL_COMMUNITY): Payer: Self-pay | Admitting: Licensed Clinical Social Worker

## 2019-08-02 ENCOUNTER — Telehealth (HOSPITAL_COMMUNITY): Payer: Self-pay | Admitting: *Deleted

## 2019-08-02 ENCOUNTER — Other Ambulatory Visit (HOSPITAL_COMMUNITY): Payer: Self-pay

## 2019-08-02 MED ORDER — SACUBITRIL-VALSARTAN 24-26 MG PO TABS: 1.0000 | ORAL_TABLET | Freq: Two times a day (BID) | ORAL | 5 refills | Status: DC

## 2019-08-02 MED ORDER — CARVEDILOL 3.125 MG PO TABS: 3.1250 mg | ORAL_TABLET | Freq: Two times a day (BID) | ORAL | 5 refills | Status: DC

## 2019-08-02 NOTE — Telephone Encounter (Signed)
  Last week's goal: - CEP email RPE scale for patient reference  - Increase movement throughout the day - Take an intentional 30 minutes 5 days a week (RPE 11-13)  - Make more than 1 trip to get the groceries out of the car  - Continue Journaling daily and reporting in weekly sessions with E&W Coach (RCEP, Chalese Peach)  - Weight loss appointment tomorrow and patient will email CEP with review of plan    Progress Note:  - Today's weight 352 lbs - Patient emailed journal and notes Wednesday 11/4  o This included a daily note of what she ate, her achievements, her frustrations, and her weaknesses. Several days she beat herself up for sneaking and snacking on candy. She congratulated herself on cooking separate options than her children's food. She destressed one day with organizing and forgot her meds and skipped meals. She felt conflicted in this as she was not balanced in healthy decision making in overall wellness. She always consoled herself and left encouraged for a better day tomorrow.  - She discussed her meal times with me breakfast 8:30, lunch 1pm and dinner 6:30 with no snacks beyond 7pm.  - Example of her self-talk: "do you want to the lose this weight, have this surgery to live and be with your children, or do you want this sandwhich"   - She is noticing her skin is changing for the better - She prays, sings, and spends time in journaling to battle her tearful emotions  Achievements: - She added extra trips to her car (downstairs and back up) each day to increase her physical activity. For example, she purposefully left her phone in the car so she would have to move more to retrieve it.  - She says that going up and down the stairs 3x a day (on a good day) and she feels this easier and she is feeling motivation around that it is helping her to manage and cope with her anxiety related to her dyspnea.  - She is practicing mindfulness with being present in her 5 senses and using breathing  techniques  - She has a pair of pants she bought a month ago that did not fit and she said she was able to put them on this week and she felt very rewarded. - She is going by this daily motto: "Every time you get distracted and discouraged, sit back and think about the why"  Challenges/Frustrations: - Overeating and feeling nauseated at meals - She is feeling overwhelmed with pressure of not wanting to do something or suffering consequences - She is content being overweight and has accepted it over her lifetime, however she understands her health is at risk and that she needs to shift gears - Battling her emotions and is often crying and doesn't seem to know how to overcome but is going to reach out to her therapist.   New Weekly Goal (s): - She is going to reach out to her therapist for more professional help for her tearful emotions. - Continue walking 5 days a week (weekdays) for 30 minutes (RPE 11-13)  - Work to eat on her set schedule and record in journal - Walk stairs additional 2x each day  Next Session:   This week: Wednesday or Friday 11/18 or 11/19 (via Telephone Encounter)    Landis Martins, MS, ACSM-RCEP Clinical Exercise Physiologist

## 2019-08-02 NOTE — Telephone Encounter (Signed)
CSW contacted patient this morning to discuss speaking with exercise physiologist and community paramedic- they had trouble getting in contact with patient last week due to patients phone not working.  Pt still unable to get phone working but can borrow a friends phone- asks that exercise physiologist call at around 1pm and that [employer] come to the house at 3:30pm  CSW also discussed pt calling nutritionist that she was supposed to last week and reschedule- she states that she will do this today.  Pt also reports she has her next weight loss appt today at 5pm- no barriers to attending per patient.  She is feeling optimistic about her weight at this time- it has been trending downward and states that she has been around 352/354 and that she fit into a pair of pants she didn't think she could fit into so she is feeling good about her weightloss efforts at this time.  States she stayed to her diet for the most part but did eat fast food over the weekend as she was on a road trip.  No further concerns or assistance needed at this time per patient- CSW will continue to follow and assist as needed  Jorge Ny, [facility] Desk#: [phone] Cell#: [phone]

## 2019-08-02 NOTE — Telephone Encounter (Signed)
Refilled prescriptions for Entresto and carvedilol. Sent to [facility].

## 2019-08-02 NOTE — Progress Notes (Signed)
Paramedicine Encounter    [name], female    DOB: 10-05-1980, 38 y.o.   MRN: [id number]   Patient Care Team: Sheppard Evens as PCP - General (Internal Medicine) Constance Haw, MD as PCP - Electrophysiology (Cardiology) Larey Dresser, MD as PCP - Advanced Heart Failure (Cardiology)  Patient Active Problem List   Diagnosis Date Noted  . Acute on chronic systolic CHF (congestive heart failure) ([place]) 06/17/2019  . Obstructive sleep apnea 06/17/2019  . 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 Diabetes mellitus ([place]) 06/17/2019  . Amenorrhea 08/03/2018  . Congestive heart failure ([place]) 06/29/2018  . Hypotension 06/07/2018  . Rhinovirus infection 06/06/2018  . Asthma 06/05/2018  . Acute respiratory failure with hypoxia ([place]) 06/05/2018  . Hypokalemia 06/05/2018  . Snoring 09/23/2016  . Cough 04/10/2016  . NICM (nonischemic cardiomyopathy) ([place]) 01/25/2016  . Tobacco abuse 01/25/2016  . Acute on chronic combined systolic and diastolic CHF (congestive heart failure) ([place]) 01/25/2016  . Mitral regurgitation   . Abnormal EKG-inf TWI 01/15/2016  . Positive D dimer-CTA neg for PE 01/15/2016  . Morbid obesity- BMI 57 01/14/2016  . Abdominal pain 01/14/2016  . 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 At risk for sleep apnea 01/14/2016  . Cardiomyopathy ([place]) 11/01/2015  . Morbid obesity with BMI of 50.0-59.9, adult ([place]) 08/16/2015    Current Outpatient Medications:  .  albuterol (PROAIR HFA) 108 (90 Base) MCG/ACT inhaler, Inhale 2 puffs into the lungs every 6 (six) hours as needed for wheezing or shortness of breath., Disp: , Rfl:  .  atorvastatin (LIPITOR) 10 MG tablet, Take 1 tablet (10 mg total) by mouth daily., Disp: 90 tablet, Rfl: 3 .  digoxin (LANOXIN) 0.125 MG tablet, Take 1 tablet (0.125 mg total) by mouth daily., Disp: 90 tablet, Rfl: 3 .  glipiZIDE (GLUCOTROL) 5 MG tablet, Take 1 tablet (5 mg total) by mouth daily.,  Disp: 90 tablet, Rfl: 0 .  ivabradine (CORLANOR) 5 MG TABS tablet, Take 1 tablet (5 mg total) by mouth 2 (two) times daily with a meal., Disp: 60 tablet, Rfl: 5 .  Magnesium Oxide 400 (240 Mg) MG TABS, Take 1 tablet (400 mg total) by mouth 2 (two) times daily., Disp: 60 tablet, Rfl: 6 .  metolazone (ZAROXOLYN) 2.5 MG tablet, Take 1 tablet (2.5 mg total) by mouth once a week., Disp: 10 tablet, Rfl: 0 .  milrinone (PRIMACOR) 20 MG/100 ML SOLN infusion, Inject 0.0414 mg/min into the vein continuous., Disp:  , Rfl:  .  potassium chloride SA (KLOR-CON) 20 MEQ tablet, Take 3 tablets (60 mEq total) by mouth 3 (three) times daily. Take an additional 1meq with metolazone., Disp: 280 tablet, Rfl: 3 .  PRESCRIPTION MEDICATION, CPAP- AT BEDTIME, Disp: , Rfl:  .  spironolactone (ALDACTONE) 25 MG tablet, Take 1 tablet (25 mg total) by mouth daily., Disp: 90 tablet, Rfl: 3 .  torsemide (DEMADEX) 20 MG tablet, Take 4 tablets (80 mg total) by mouth 2 (two) times daily., Disp: 720 tablet, Rfl: 3 .  carvedilol (COREG) 3.125 MG tablet, Take 1 tablet (3.125 mg total) by mouth 2 (two) times daily with a meal., Disp: 60 tablet, Rfl: 5 .  ipratropium-albuterol (DUONEB) 0.5-2.5 (3) MG/3ML SOLN, Take 3 mLs by nebulization every 4 (four) hours as needed. (Patient not taking: Reported on 08/02/2019), Disp: 360 mL, Rfl: 0 .  sacubitril-valsartan (ENTRESTO) 24-26 MG, Take 1 tablet by mouth 2 (two) times daily., Disp: 60 tablet, Rfl: 5 Allergies  Allergen Reactions  .  Metformin And Related Diarrhea    And caused severe "dry mouth," also      Social History   Socioeconomic History  . Marital status: Single    Spouse name: Not on file  . Number of children: 2  . Years of education: Not on file  . Highest education level: Not on file  Occupational History  . Not on file  Social Needs  . Financial resource strain: Not very hard  . Food insecurity    Worry: Never true    Inability: Never true  . Transportation needs     Medical: Yes    Non-medical: Yes  Tobacco Use  . Smoking status: Former Smoker    Quit date: 10/19/2015    Years since quitting: 3.7  . Smokeless tobacco: Never Used  Substance and Sexual Activity  . Alcohol use: Yes    Alcohol/week: 0.0 standard drinks    Comment: occasionally  . Drug use: Yes    Types: Marijuana    Comment: only socially  . Sexual activity: Yes  Lifestyle  . Physical activity    Days per week: Not on file    Minutes per session: Not on file  . Stress: Not on file  Relationships  . Social Musician on phone: Not on file    Gets together: Not on file    Attends religious service: Not on file    Active member of club or organization: Not on file    Attends meetings of clubs or organizations: Not on file    Relationship status: Not on file  . Intimate partner violence    Fear of current or ex partner: Not on file    Emotionally abused: Not on file    Physically abused: Not on file    Forced sexual activity: Not on file  Other Topics Concern  . Not on file  Social History Narrative  . Not on file    Physical Exam      Future Appointments  Date Time Provider Department Center  08/17/2019 10:30 AM [facility] PA/NP [facility] None  10/25/2019  7:45 AM [facility] DEVICE REMOTES [facility]  01/24/2020  7:45 AM [facility] DEVICE REMOTES [facility]  04/24/2020  7:45 AM [facility] DEVICE REMOTES [facility]  07/24/2020  7:45 AM [facility] DEVICE REMOTES [facility]    BP (!) 108/0   Pulse 96   Temp (!) 97.3 F (36.3 C)   Resp 18   Wt (!) 352 lb (159.7 kg)   SpO2 98%   BMI 58.58 kg/m   Weight yesterday-356 Last visit weight-368 @ clinic   Pt reports she is doing ok,  She reports pharmacy wouldn't refill the entresto and carvedilol-  She has been out of the entresto for 4 days.  The carvedilol she had some 12.5mg  she was cutting in half-but its still too much medication. She does report  feeling more tired while she was taking this. Advised her to trash the old meds and I asked the pharmacist to remove those old dosages from her list so it doesn't get auto filled. They had old rx of entresto for the 97-103 but it was in 2018.  I called pharmacy and they do not have rx for either.  I asked pharmacist lauren to send those in for [place].  No edema noted. Lungs clear.  I advised her to be sure to contact someone-myself, jenna or clinic for any kind of mediation issues-which I understand she had phone issues  last week.   Kerry Hough, EMT-Paramedic [phone] [facility] Paramedic  08/02/19

## 2019-08-04 ENCOUNTER — Telehealth (HOSPITAL_COMMUNITY): Payer: Self-pay | Admitting: Licensed Clinical Social Worker

## 2019-08-04 ENCOUNTER — Telehealth (HOSPITAL_COMMUNITY): Payer: Self-pay | Admitting: *Deleted

## 2019-08-04 NOTE — Telephone Encounter (Signed)
   Last week's goal: - CEP email RPE scale for patient reference  - Increase movement throughout the day - Take an intentional 30 minutes 5 days a week (RPE 11-13)  - Make more than 1 trip to get the groceries out of the car  - Continue Journaling daily and reporting in weekly sessions with E&W Coach (RCEP, Tinlee Navarrette)  - Weight loss appointment tomorrow and patient will email CEP with review of plan   This Week's Goals: (set Monday, reviewed and revised Wednesday) - She is going to reach out to her therapist for more professional help for her tearful emotions. Appointment setup for Thursday.  - Continue walking 5 days a week (weekdays) for 30 minutes (RPE 11-13) walks at 2pm.  - Work to eat on her set schedule and record in journal - Walk stairs additional 2x each day-once in the morning, once in the evening (to break up her activity) - One Meal with plain oatmeal with cinnamon, bananas, berries and vanilla as a breakfast option  Progress Note:  - Today's weight 354 lbs - She says she had a rough last couple of days mentally and has not been cooking  - Today she is excited to cook and has shared her meal plans for the day after having a very unsatisfying breakfast. - She is getting tired of eggs to the point of feeling sick when she eats them. This prompted me to inquire about the options her weightloss center has suggested and helped her to create breakfast items based on the plan from her care team. The options and suggestions excited her for a new breakfast option. We discussed sodium awareness and being careful with added sugars.  - She is still journaling most days and is checking off her lists, which makes her feel productive and increased her mood.  - She has spoken several times on her "god-brother" and his support on her moods and helps to keep her mind focused on positivity. He is living with her currently as a support person.  - She has an appointment set up for tomorrow with her  therapist to discuss her emotional fluctuations   Achievements: - She is walking up and down the stairs 2x a day each day to increase her physical activity, in addition to her regular trips in and out of the house and she is documenting this. She feels proud for doing something she doesn't want to do.  - She is still practicing mindfulness with being present in her 5 senses and using breathing techniques  - She shared a motto of ATTITUDE adding up to 100 and in reference to it being 100% of your everything.  - She feels more determined today   Challenges/Frustrations: - Holiday stress - Raising her children and taking care of her home.  - Her health and overall status and the pressure to improve her health.   Next Session:   Monday 11/23 in the am hours (via Telephone Encounter)

## 2019-08-04 NOTE — Telephone Encounter (Signed)
CSW contacted patient on daughters number [phone]) as her phone is still not working.  She confirmed that her phone will not be fixed in the near future so this number will be the best to get a hold of her at.  CSW updated home infusion agency regarding change in contact number as well as other involved team members.  Pt discussed current diet from her weight loss clinic and states she feels like she is doing ok with it but is getting a little bored with the same food.  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  CSW expressed concerns with this due to high level of contact with others as well as long hours on her feet- suggested she speak with the MD about this before accepting any positions.  CSW and pt also discussed housing concerns.  Pt living in [place] but states she is supposed to leave next summer/early fall- wants to make sure she has enough time to find housing in a safe area.  CSW will assist in finding potential housing options for pt to apply to.  CSW will continue to follow and assist as needed  Jorge Ny, [facility] Desk#: [phone] Cell#: [phone]

## 2019-08-06 ENCOUNTER — Ambulatory Visit: Payer: Medicaid Other | Admitting: Dietician

## 2019-08-09 ENCOUNTER — Telehealth (HOSPITAL_COMMUNITY): Payer: Self-pay | Admitting: Licensed Clinical Social Worker

## 2019-08-09 NOTE — Telephone Encounter (Signed)
CSW called pt to check in with patient after the weekend.  She reports she is doing well overall- she is not sure as to the progress of her weight loss at this time.  States that she showed on one scale that she was 345 yesterday but then another scale showed 358 this morning so don't know how accurate her readings have been.  She did admit to missing her does of medications last night because she fell asleep early but she has taken them this morning and doesn't report any concerning symptoms.  Patient states she feels as if she has been ok regarding the quality of her food but has not been keeping up with the eating scheduled that she was given by the weight loss clinic- states that she has continued to skip meals.  Pt feeling motivated to correct this moving forward but has not eaten yet this morning.  Patient is feeling good going into thanksgiving and feels as if she has a good plan for portion control over the holiday- plans to use a salt substitute and plans to just get a spoonful of each item.  Reports that her next weightloss clinic appt is tomorrow (though she is unsure who she will be meeting with during that time) and that her first gym appt with them is on Nov. 30th.  She is really excited to get working in the gym.  Pt reports no further needs or concerns at this time- CSW will continue to follow and assist as needed  Jorge Ny, [facility] Desk#: [phone] Cell#: [phone]

## 2019-08-10 ENCOUNTER — Telehealth (HOSPITAL_COMMUNITY): Payer: Self-pay | Admitting: *Deleted

## 2019-08-10 NOTE — Telephone Encounter (Signed)
Attempted to contact patient for appointment for Exercise and Wellness coaching follow-up as planned from last appointment 08/04/2019. CEP/EWC will try again to reach patient.    Landis Martins, MS, ACSM-RCEP Clinical Exercise Physiologist

## 2019-08-11 ENCOUNTER — Telehealth (HOSPITAL_COMMUNITY): Payer: Self-pay | Admitting: *Deleted

## 2019-08-11 ENCOUNTER — Other Ambulatory Visit (HOSPITAL_COMMUNITY): Payer: Self-pay

## 2019-08-11 NOTE — Telephone Encounter (Signed)
Previous Weeks' Goals:  - She is going to reach out to her therapist for more professional help for her tearful emotions. Appointment setup for Thursday.  - Continue walking 5 days a week (weekdays) for 30 minutes (RPE 11-13) walks at 2pm.  - Work to eat on her set schedule and record in journal - Walk stairs additional 2x each day-once in the morning, once in the evening (to break up her activity) - One Meal with plain oatmeal with cinnamon, bananas, berries and vanilla as a breakfast option  Progress Note:  - Today's weight 358 lbs--- She feels like she has more fluid on her than normal. - Patient tried the fruit with the oatmeal and did not like the oatmeal and berry combination. She really enjoyed the oatmeal with nutmeg, cinnamon and milk. She has been trying to make a soupier oatmeal.  - She is eating on schedule and is adjusting more and more. She struggles with breakfast and her weight loss nutritionist advised her with some protein shakes.  - She saw her therapist on Thursday and she was able to focus on her own struggles and felt determined after the appointment.  - She felt like she had someone in her corner and with compassion when she discussed frustrations with her weight loss guide. - She is still journaling most days and this is including mostly her foods and her weight.  - She has spoken several times on her "god-brother" and he lives with her and is causing her a little bit of stress in the home, as he does not work. He has previously been supportive to her.  - She has another appointment Tuesday with her therapist to discuss her emotional fluctuations.  - She is still walking at 2pm for 30-45 minutes 5 days a week. She is really enjoying the exercise and feels like she is getting "addicted" to this routine. Her daughter is exercising with her and she says they are bonding over exercise. This is comforting to her as she says they have not had a strong relationship in the past.   - She has had a habit of eating dinner in her bed and then lying down. She wants to change this and has taken action to not eat in her bedroom anymore.   Achievements: - Stopped eating in her bedroom  - Walked 5 times 30-45 mins 5x a week  - Has been walking the stairs 2x extra at one time each day "just to get this done and out of the way" - She is feeling very proud and thankful for her own efforts - Emotionally and mentally she feels as though she is remaining more balanced, particularly in her self-talk  Challenges/Frustrations: - Her God-brother living in her home with no job - She does not like weighing every day as she gets discouraged  - Holiday stress - Raising her children and taking care of her home   Next [employer] goals:  - Walk stairs additional 2x each day-once in the morning, once in the evening (to break up her activity) - Continue walking 5 days a week (weekdays) for 30 minutes (RPE 11-13) walks at 2pm.  - Work to eat on her set schedule and record weight in journal - Long-term she wants to work on her budget   Next Session:  CEP/EWC will see patient in clinic on Tuesday 10:30am  Monday 12/2 in the am hours (via Telephone Encounter)     Landis Martins, MS, ACSM-RCEP Clinical Exercise Physiologist

## 2019-08-11 NOTE — Progress Notes (Signed)
Paramedicine Encounter    [name], female    DOB: 1981/04/29, 38 y.o.   MRN: [id number]   Patient Care Team: Otila Back as PCP - General (Internal Medicine) Regan Lemming, MD as PCP - Electrophysiology (Cardiology) Laurey Morale, MD as PCP - Advanced Heart Failure (Cardiology)  Patient Active Problem List   Diagnosis Date Noted  . Acute on chronic systolic CHF (congestive heart failure) ([facility]) 06/17/2019  . Obstructive sleep apnea 06/17/2019  . 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 Diabetes mellitus ([facility]) 06/17/2019  . Amenorrhea 08/03/2018  . Congestive heart failure ([facility]) 06/29/2018  . Hypotension 06/07/2018  . Rhinovirus infection 06/06/2018  . Asthma 06/05/2018  . Acute respiratory failure with hypoxia ([facility]) 06/05/2018  . Hypokalemia 06/05/2018  . Snoring 09/23/2016  . Cough 04/10/2016  . 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 Tobacco abuse 01/25/2016  . Acute on chronic combined systolic and diastolic CHF (congestive heart failure) ([facility]) 01/25/2016  . Mitral regurgitation   . Abnormal EKG-inf TWI 01/15/2016  . Positive D dimer-CTA neg for PE 01/15/2016  . Morbid obesity- BMI 57 01/14/2016  . Abdominal pain 01/14/2016  . 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 At risk for sleep apnea 01/14/2016  . Cardiomyopathy ([facility]) 11/01/2015  . Morbid obesity with BMI of 50.0-59.9, adult ([facility]) 08/16/2015    Current Outpatient Medications:  .  atorvastatin (LIPITOR) 10 MG tablet, Take 1 tablet (10 mg total) by mouth daily., Disp: 90 tablet, Rfl: 3 .  carvedilol (COREG) 3.125 MG tablet, Take 1 tablet (3.125 mg total) by mouth 2 (two) times daily with a meal., Disp: 60 tablet, Rfl: 5 .  digoxin (LANOXIN) 0.125 MG tablet, Take 1 tablet (0.125 mg total) by mouth daily., Disp: 90 tablet, Rfl: 3 .  glipiZIDE (GLUCOTROL) 5 MG tablet, Take 1 tablet (5 mg total) by mouth daily., Disp: 90 tablet, Rfl: 0 .   ivabradine (CORLANOR) 5 MG TABS tablet, Take 1 tablet (5 mg total) by mouth 2 (two) times daily with a meal., Disp: 60 tablet, Rfl: 5 .  Magnesium Oxide 400 (240 Mg) MG TABS, Take 1 tablet (400 mg total) by mouth 2 (two) times daily., Disp: 60 tablet, Rfl: 6 .  metolazone (ZAROXOLYN) 2.5 MG tablet, Take 1 tablet (2.5 mg total) by mouth once a week., Disp: 10 tablet, Rfl: 0 .  milrinone (PRIMACOR) 20 MG/100 ML SOLN infusion, Inject 0.0414 mg/min into the vein continuous., Disp:  , Rfl:  .  potassium chloride SA (KLOR-CON) 20 MEQ tablet, Take 3 tablets (60 mEq total) by mouth 3 (three) times daily. Take an additional with metolazone., Disp: 280 tablet, Rfl: 3 .  PRESCRIPTION MEDICATION, CPAP- AT BEDTIME, Disp: , Rfl:  .  sacubitril-valsartan (ENTRESTO) 24-26 MG, Take 1 tablet by mouth 2 (two) times daily., Disp: 60 tablet, Rfl: 5 .  spironolactone (ALDACTONE) 25 MG tablet, Take 1 tablet (25 mg total) by mouth daily., Disp: 90 tablet, Rfl: 3 .  torsemide (DEMADEX) 20 MG tablet, Take 4 tablets (80 mg total) by mouth 2 (two) times daily., Disp: 720 tablet, Rfl: 3 .  albuterol (PROAIR HFA) 108 (90 Base) MCG/ACT inhaler, Inhale 2 puffs into the lungs every 6 (six) hours as needed for wheezing or shortness of breath., Disp: , Rfl:  .  ipratropium-albuterol (DUONEB) 0.5-2.5 (3) MG/3ML SOLN, Take 3 mLs by nebulization every 4 (four) hours as needed. (Patient not taking: Reported on 08/02/2019), Disp: 360 mL, Rfl: 0 Allergies  Allergen Reactions  .  Metformin And Related Diarrhea    And caused severe "dry mouth," also      Social History   Socioeconomic History  . Marital status: Single    Spouse name: Not on file  . Number of children: 2  . Years of education: Not on file  . Highest education level: Not on file  Occupational History  . Not on file  Social Needs  . Financial resource strain: Not very hard  . Food insecurity    Worry: Never true    Inability: Never true  . Transportation  needs    Medical: Yes    Non-medical: Yes  Tobacco Use  . Smoking status: Former Smoker    Quit date: 10/19/2015    Years since quitting: 3.8  . Smokeless tobacco: Never Used  Substance and Sexual Activity  . Alcohol use: Yes    Alcohol/week: 0.0 standard drinks    Comment: occasionally  . Drug use: Yes    Types: Marijuana    Comment: only socially  . Sexual activity: Yes  Lifestyle  . Physical activity    Days per week: Not on file    Minutes per session: Not on file  . Stress: Not on file  Relationships  . Social Musician on phone: Not on file    Gets together: Not on file    Attends religious service: Not on file    Active member of club or organization: Not on file    Attends meetings of clubs or organizations: Not on file    Relationship status: Not on file  . Intimate partner violence    Fear of current or ex partner: Not on file    Emotionally abused: Not on file    Physically abused: Not on file    Forced sexual activity: Not on file  Other Topics Concern  . Not on file  Social History Narrative  . Not on file    Physical Exam      Future Appointments  Date Time Provider Department Center  08/17/2019 10:30 AM [facility] PA/NP [facility] None  10/25/2019  7:45 AM [facility] DEVICE REMOTES [facility]  01/24/2020  7:45 AM [facility] DEVICE REMOTES [facility]  04/24/2020  7:45 AM [facility] DEVICE REMOTES [facility]  07/24/2020  7:45 AM [facility] DEVICE REMOTES [facility]    BP (!) 114/0   Pulse 96   Resp 16   Wt (!) 358 lb (162.4 kg)   SpO2 98%   BMI 59.57 kg/m   Weight yesterday-357  Last visit weight-352  Pt reports she is doing ok. She states she thinks she is getting an UTI.she states flank pain with burning/sensitivity when urination.  I advised her to call her PCP for appointment, she did call them but the appoint was several days out and she would just do the walk in.  She also  said didn't have the money to pay doc fee of $3 nor the money for the med.  I gave her $3 for the doc visit and told her to go to [facility] on [address] and they would def waive the copay for it.  She has not taken her meds yet today.  She denies sob. Pt states she gets dizzy sometimes when she gets up too fast.  Not using CPAP at all. She said it needed to be wiped out. I told her to get alcohol wipe and wipe it out each use. And she said she has gotten lazy about it.  She states her diet is going~~~~ She likes the weight loss clinic, still frustrated that she isnt losing any. But she is trying to do the portion control.    [name], [facility] Paramedic  08/11/19

## 2019-08-16 ENCOUNTER — Telehealth (HOSPITAL_COMMUNITY): Payer: Self-pay | Admitting: Cardiology

## 2019-08-16 NOTE — Telephone Encounter (Signed)
[place] Nurse with [facility] called to report that they were unable to get weekly labs from the patient on Friday 08/13/19. Adonis Brook was wondering if it would be OK to wait until this Friday to get another set of labs, or if Dr. Aundra Dubin needed labs sooner. The office can leave a voicemail on the nurses phone with instructions if she can not be reached.

## 2019-08-17 ENCOUNTER — Other Ambulatory Visit (HOSPITAL_COMMUNITY): Payer: Self-pay

## 2019-08-17 ENCOUNTER — Other Ambulatory Visit: Payer: Self-pay

## 2019-08-17 ENCOUNTER — Ambulatory Visit (HOSPITAL_COMMUNITY)
Admission: RE | Admit: 2019-08-17 | Discharge: 2019-08-17 | Disposition: A | Discharge status: Home / Self Care | Payer: Medicaid Other | Source: Ambulatory Visit | Attending: Cardiology | Admitting: Cardiology

## 2019-08-17 ENCOUNTER — Encounter (HOSPITAL_COMMUNITY): Payer: Self-pay

## 2019-08-17 VITALS — BP 126/83 | HR 95 | Wt 354.0 lb

## 2019-08-17 DIAGNOSIS — J45909 Unspecified asthma, uncomplicated: Secondary | ICD-10-CM | POA: Diagnosis not present

## 2019-08-17 DIAGNOSIS — I5022 Chronic systolic (congestive) heart failure: Secondary | ICD-10-CM | POA: Insufficient documentation

## 2019-08-17 DIAGNOSIS — E876 Hypokalemia: Secondary | ICD-10-CM | POA: Diagnosis not present

## 2019-08-17 DIAGNOSIS — G4730 Sleep apnea, unspecified: Secondary | ICD-10-CM

## 2019-08-17 DIAGNOSIS — Z7984 Long term (current) use of oral hypoglycemic drugs: Secondary | ICD-10-CM | POA: Diagnosis not present

## 2019-08-17 DIAGNOSIS — G4733 Obstructive sleep apnea (adult) (pediatric): Secondary | ICD-10-CM | POA: Diagnosis not present

## 2019-08-17 DIAGNOSIS — Z8249 Family history of ischemic heart disease and other diseases of the circulatory system: Secondary | ICD-10-CM | POA: Insufficient documentation

## 2019-08-17 DIAGNOSIS — Z6841 Body Mass Index (BMI) 40.0 and over, adult: Secondary | ICD-10-CM | POA: Insufficient documentation

## 2019-08-17 DIAGNOSIS — Z87891 Personal history of nicotine dependence: Secondary | ICD-10-CM | POA: Insufficient documentation

## 2019-08-17 DIAGNOSIS — I471 Supraventricular tachycardia: Secondary | ICD-10-CM | POA: Insufficient documentation

## 2019-08-17 DIAGNOSIS — I340 Nonrheumatic mitral (valve) insufficiency: Secondary | ICD-10-CM | POA: Diagnosis not present

## 2019-08-17 DIAGNOSIS — Z79899 Other long term (current) drug therapy: Secondary | ICD-10-CM | POA: Diagnosis not present

## 2019-08-17 DIAGNOSIS — I428 Other cardiomyopathies: Secondary | ICD-10-CM | POA: Insufficient documentation

## 2019-08-17 DIAGNOSIS — Z833 Family history of diabetes mellitus: Secondary | ICD-10-CM | POA: Insufficient documentation

## 2019-08-17 DIAGNOSIS — E1165 Type 2 diabetes mellitus with hyperglycemia: Secondary | ICD-10-CM | POA: Diagnosis not present

## 2019-08-17 DIAGNOSIS — Z720 Tobacco use: Secondary | ICD-10-CM

## 2019-08-17 LAB — CBC
HCT: 38.4 % (ref 36.0–46.0)
Hemoglobin: 13.6 g/dL (ref 12.0–15.0)
MCH: 28 pg (ref 26.0–34.0)
MCHC: 35.4 g/dL (ref 30.0–36.0)
MCV: 79 fL — ABNORMAL LOW (ref 80.0–100.0)
Platelets: 269 10*3/uL (ref 150–400)
RBC: 4.86 MIL/uL (ref 3.87–5.11)
RDW: 13.2 % (ref 11.5–15.5)
WBC: 9.6 10*3/uL (ref 4.0–10.5)
nRBC: 0 % (ref 0.0–0.2)

## 2019-08-17 LAB — COMPREHENSIVE METABOLIC PANEL
ALT: 22 U/L (ref 0–44)
AST: 23 U/L (ref 15–41)
Albumin: 3.5 g/dL (ref 3.5–5.0)
Alkaline Phosphatase: 56 U/L (ref 38–126)
Anion gap: 15 (ref 5–15)
BUN: 12 mg/dL (ref 6–20)
CO2: 27 mmol/L (ref 22–32)
Calcium: 8.9 mg/dL (ref 8.9–10.3)
Chloride: 96 mmol/L — ABNORMAL LOW (ref 98–111)
Creatinine, Ser: 1.15 mg/dL — ABNORMAL HIGH (ref 0.44–1.00)
GFR calc Af Amer: >60 mL/min (ref >60)
GFR calc non Af Amer: >60 mL/min (ref >60)
Glucose, Bld: 215 mg/dL — ABNORMAL HIGH (ref 70–99)
Potassium: 2.8 mmol/L — ABNORMAL LOW (ref 3.5–5.1)
Sodium: 138 mmol/L (ref 135–145)
Total Bilirubin: 0.4 mg/dL (ref 0.3–1.2)
Total Protein: 7.5 g/dL (ref 6.5–8.1)

## 2019-08-17 LAB — MAGNESIUM: Magnesium: 1.5 mg/dL — ABNORMAL LOW (ref 1.7–2.4)

## 2019-08-17 LAB — BRAIN NATRIURETIC PEPTIDE: B Natriuretic Peptide: 169.1 pg/mL — ABNORMAL HIGH (ref 0.0–100.0)

## 2019-08-17 MED ORDER — IVABRADINE HCL 7.5 MG PO TABS: 7.5000 mg | ORAL_TABLET | Freq: Two times a day (BID) | ORAL | 3 refills | Status: DC

## 2019-08-17 NOTE — Progress Notes (Signed)
CSW met with pt during clinic visit.  Pt reporting that she is weighing 347 at home which is down from her starting weight of around 358.  Pt continuing to work on increased activity levels at home and tries to walk at least 45 minutes everyday and be active at home during the day.  Pt is starting to do protein shakes in the morning instead of a meal as she often doesn't have an appetite and feels as if this is working better for her so far.  Pt hopeful to be in the 330's by next MD appt in 4 weeks.  CSW will continue to follow and assist as needed  Jorge Ny, [facility] Desk#: [phone] Cell#: [phone]

## 2019-08-17 NOTE — Telephone Encounter (Signed)
LM on vm that [place] does not have to draw labs on Friday as patient is in office today.  She will have labs done today

## 2019-08-17 NOTE — Telephone Encounter (Signed)
  Ok to obtain labs on Friday.   [name] Clegg NP-C  8:56 AM

## 2019-08-17 NOTE — Patient Instructions (Signed)
INCREASE Corlanor to 7.5 mg, one tab twice daily  Labs today We will only contact you if something comes back abnormal or we need to make some changes. Otherwise no news is good news!  Your physician recommends that you schedule a follow-up appointment in: 4 weeks with [name] Clegg,NP and 3 months with Dr Aundra Dubin  Do the following things EVERYDAY: 1) Weigh yourself in the morning before breakfast. Write it down and keep it in a log. 2) Take your medicines as prescribed 3) Eat low salt foods-Limit salt (sodium) to 2000 mg per day.  4) Stay as active as you can everyday 5) Limit all fluids for the day to less than 2 liters  At the [facility], you and your health needs are our priority. As part of our continuing mission to provide you with exceptional heart care, we have created designated Provider Care Teams. These Care Teams include your primary Cardiologist (physician) and Advanced Practice Providers (APPs- Physician Assistants and Nurse Practitioners) who all work together to provide you with the care you need, when you need it.   You may see any of the following providers on your designated Care Team at your next follow up: Marland Kitchen Dr Glori Bickers . Dr Loralie Champagne . Darrick Grinder, NP . Lyda Jester, PA   Please be sure to bring in all your medications bottles to every appointment.

## 2019-08-17 NOTE — Progress Notes (Signed)
Paramedicine Encounter   [name] , female,   DOB: [birth year],38 y.o.,  MRN: [id number]   Met patient in clinic today with provider.    B/p-126/83 p-95 [id number]@ clinic-354 Weight @ [id number]  Her picc line dressing was changed today b/c the nurse missed her on Friday.  She reports that she is doing ok. Felt she done good over thanksgiving holiday and keeping portions down.  She states her weights at home are reflecting a decrease however the numbers at clinic are not.  Her corlanor is being increased to 7.23m BID.  Will f/u this week.   KMarylouise Stacks EWestfir12/09/2018

## 2019-08-17 NOTE — Telephone Encounter (Signed)
Please see note from home health RN. I will call them to provide them with the response. Routed to NP

## 2019-08-17 NOTE — Progress Notes (Signed)
Advanced Heart Failure Clinic Note   Referring Physician: PCP: Janine Limbo, PA-C PCP-Cardiologist: Dr. Aundra Dubin   Reason for Visit: Heart Failure  HPI: 38 y/o female with super morbid obesity, DM2, OSA, chronic systolic HF due to severe NICM (EF ~20%), and medtronic ICD.   She presented to the ER on 06/17/19 with several days of worsening HF with SOB at rest, marked edema and orthopnea/PND, not responding to increased doses of oral diuretics. She admitted to high levels of water and Gatorade intake.   Her admit wt was 380 lb (Dry wt ~360 lb). She was started on IV Lasix but had initial poor urinary response. PO metolazone added as adjunct. Repeat echo was obtained and showed EF <20%, relatively normal RV, moderate MR. RHC 10/5 with low output (CI 1.8) and elevated PCWP but near normal RA pressure. She was started on inotropic therapy w/ milrinone for low output, 0.25 mcg/kg/min. Also treated w/ Entresto, Coreg, spironolactone, digoxin and ivabradine. She was later transitioned off of IV Lasix to po torsemide 80 mg qam/60 mg qpm.   Unfortunately, she was not a transplant candidate due to her morbid obesity. We discussed her at [facility] and Dr. Prescott Gum has seen. She will need significant weight loss to get to LVAD. Will use home milrinone to try to facilitate increased activity to work towards weight loss. She understands that milrinone is not a good end-point and that we would like to use it as a bridge to eventual LVAD. RV function seems adequate for LVAD. Pt is motivated to lose weight. Tunnel cath was placed for home milrinone and home health was arranged. She was discharged home on 10/9 on milrinone 0.25 mcg.   06/27/19 she presented to the [facility] w/ CC of palpitations. Was brought in by EMS and was reportedly in SVT with HR in the 180s. She was given adenosine and converted to NSR prior to arrival to the ED. In the ED she was stable and symptoms had resolved. Labs showed normal CBC. BMP  showed hypokalemia (3.0), hyperglycemia (243), mildly elevated BUN/SCr, and low Mag, 1.5. She was given K and Mg supplementation and discharged home from the ED.   Today she returns for HF follow up. Overall feeling fine. Says she trying hard to lose weight.  Denies SOB/PND/Orthopnea. Able to 15 steps twice a day. Using CPAP.  Appetite ok. No fever or chills. Weight at home down a couple of pounds 347. Taking all medications. Remains on milrinone 0.25 mcg. Followed by [facility] for milrinone and [facility]. Continue HF Paramedicine.  Optivol Activity ~1 hour a day.  No VT/Af  Fluid well below the threshold, stable.    RHC Procedural Findings (10/5): Hemodynamics (mmHg) RA mean 7 RV 49/10 PA 65/25 mean 45 PCWP mean 27 Oxygen saturations: PA 57% AO 100% Cardiac Output (Fick) 4.65  Cardiac Index (Fick) 1.82 PVR 3.87 WU PAPI 5.7  2D Echo 06/18/19  Left ventricular ejection fraction, by visual estimation, is <20%. The left ventricle has severely decreased function. Severely increased left ventricular size. There is no left ventricular hypertrophy. Severe global hypokinesis. 2. Left ventricular diastolic Doppler parameters are indeterminate pattern of LV diastolic filling. 3. Global right ventricle has low normal systolic function.The right ventricular size is normal. No increase in right ventricular wall thickness. 4. Left atrial size was severely dilated. 5. Right atrial size was mildly dilated. 6. The mitral valve is abnormal. Moderate mitral valve regurgitation. 7. The tricuspid valve is grossly normal. Tricuspid valve regurgitation is trivial.  8. The aortic valve is tricuspid Aortic valve regurgitation was not visualized by color flow Doppler. 9. The pulmonic valve was grossly normal. Pulmonic valve regurgitation is not visualized by color flow Doppler. 10. Mildly elevated pulmonary artery systolic pressure. 11. A pacer wire is visualized. 12. The inferior vena cava is dilated in  size with >50% respiratory variability, suggesting right atrial pressure of 8 mmHg. 13. The interatrial septum was not well visualized.    Past Medical History:  Diagnosis Date  . Asthma   . Chronic systolic CHF (congestive heart failure) (HCC)    a. ECHO 01/15/2016 EF 10-15%. 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  . Diabetes mellitus (HCC) 06/17/2019  . Hearing loss    bilateral  . Mitral regurgitation    a. severe, felt to be functional 2/2 LV dilation   . Morbid obesity (HCC)   . Snoring     Current Outpatient Medications  Medication Sig Dispense Refill  . albuterol (PROAIR HFA) 108 (90 Base) MCG/ACT inhaler Inhale 2 puffs into the lungs every 6 (six) hours as needed for wheezing or shortness of breath.    Marland Kitchen atorvastatin (LIPITOR) 10 MG tablet Take 1 tablet (10 mg total) by mouth daily. 90 tablet 3  . carvedilol (COREG) 3.125 MG tablet Take 1 tablet (3.125 mg total) by mouth 2 (two) times daily with a meal. 60 tablet 5  . digoxin (LANOXIN) 0.125 MG tablet Take 1 tablet (0.125 mg total) by mouth daily. 90 tablet 3  . glipiZIDE (GLUCOTROL) 5 MG tablet Take 1 tablet (5 mg total) by mouth daily. 90 tablet 0  . ipratropium-albuterol (DUONEB) 0.5-2.5 (3) MG/3ML SOLN Take 3 mLs by nebulization every 4 (four) hours as needed. 360 mL 0  . ivabradine (CORLANOR) 5 MG TABS tablet Take 1 tablet (5 mg total) by mouth 2 (two) times daily with a meal. 60 tablet 5  . Magnesium Oxide 400 (240 Mg) MG TABS Take 1 tablet (400 mg total) by mouth 2 (two) times daily. 60 tablet 6  . metolazone (ZAROXOLYN) 2.5 MG tablet Take 1 tablet (2.5 mg total) by mouth once a week. 10 tablet 0  . milrinone (PRIMACOR) 20 MG/100 ML SOLN infusion Inject 0.0414 mg/min into the vein continuous.    . potassium chloride SA (KLOR-CON) 20 MEQ tablet Take 3 tablets (60 mEq total) by mouth 3 (three) times daily. Take an additional with metolazone. 280 tablet 3  . PRESCRIPTION MEDICATION CPAP- AT BEDTIME    .  sacubitril-valsartan (ENTRESTO) 24-26 MG Take 1 tablet by mouth 2 (two) times daily. 60 tablet 5  . spironolactone (ALDACTONE) 25 MG tablet Take 1 tablet (25 mg total) by mouth daily. 90 tablet 3  . torsemide (DEMADEX) 20 MG tablet Take 4 tablets (80 mg total) by mouth 2 (two) times daily. 720 tablet 3   No current facility-administered medications for this encounter.     Allergies  Allergen Reactions  . Metformin And Related Diarrhea    And caused severe "dry mouth," also      Social History   Socioeconomic History  . Marital status: Single    Spouse name: Not on file  . Number of children: 2  . Years of education: Not on file  . Highest education level: Not on file  Occupational History  . Not on file  Social Needs  . Financial resource strain: Not very hard  . Food insecurity    Worry: Never true    Inability: Never true  .  Transportation needs    Medical: Yes    Non-medical: Yes  Tobacco Use  . Smoking status: Former Smoker    Quit date: 10/19/2015    Years since quitting: 3.8  . Smokeless tobacco: Never Used  Substance and Sexual Activity  . Alcohol use: Yes    Alcohol/week: 0.0 standard drinks    Comment: occasionally  . Drug use: Yes    Types: Marijuana    Comment: only socially  . Sexual activity: Yes  Lifestyle  . Physical activity    Days per week: Not on file    Minutes per session: Not on file  . Stress: Not on file  Relationships  . Social Musicianconnections    Talks on phone: Not on file    Gets together: Not on file    Attends religious service: Not on file    Active member of club or organization: Not on file    Attends meetings of clubs or organizations: Not on file    Relationship status: Not on file  . Intimate partner violence    Fear of current or ex partner: Not on file    Emotionally abused: Not on file    Physically abused: Not on file    Forced sexual activity: Not on file  Other Topics Concern  . Not on file  Social History Narrative   . Not on file      Family History  Problem Relation Age of Onset  . Heart attack Mother   . Hyperlipidemia Father   . Diabetes Brother     Vitals:   08/17/19 1052  BP: 126/83  Pulse: 95  SpO2: 97%  Weight: (!) 160.6 kg (354 lb)   Wt Readings from Last 3 Encounters:  08/17/19 (!) 160.6 kg (354 lb)  08/11/19 (!) 162.4 kg (358 lb)  08/02/19 (!) 159.7 kg (352 lb)    PHYSICAL EXAM: General:  Well appearing. No resp difficulty HEENT: normal Neck: supple. no JVD. Carotids 2+ bilat; no bruits. No lymphadenopathy or thryomegaly appreciated. Cor: PMI nondisplaced. Regular rate & rhythm. No rubs, gallops or murmurs. R upper chest tunneled catheter.  Lungs: clear Abdomen: obese, soft, nontender, nondistended. No hepatosplenomegaly. No bruits or masses. Good bowel sounds. Extremities: no cyanosis, clubbing, rash, edema Neuro: alert & orientedx3, cranial nerves grossly intact. moves all 4 extremities w/o difficulty. Affect pleasant   ASSESSMENT & PLAN: 1. Chronic Systolic HF w/ Low Output/ NICM: Echo 06/2019 with EF < 20%, relatively normal RV, moderate MR. NICM with Medtronic ICD. Recent acute exacerbation in the setting of dietary indiscretion w/ sodium. Massive diuresis w/ IV Lasix, ~17 lb. D/c dry wt 357 lb.  RHC 10/5 with low output (CI 1.8) and elevated PCWP but near normal RA pressure, started on milrinone 0.25 w/ plans to continue home milrinone as bridge to possible LVAD if significant weight loss.  - NYHA II on milrinone therapy.  - Volume status stable. Continue torsemide 80 mg twice a day.  - Check CMET, mag, cbc today.  -  Continue Coreg 3.125 mg bid.  - Continue Entresto 24/26 bid.  - Increase ivabradine to 7.5 mg twice a day.   - Continue spironolactone 25 daily.  - Continue digoxin 0.125 mg - VAD consideration: Her size and social support will be issues.  She would not be a transplant candidate at this point due to weight. [facility] Dr. Donata ClayVan Trigt has  seen.  She will need significant weight loss to get to LVAD.  Will use home milrinone to try to facilitate increased activity to work towards weight loss.  She understands that milrinone is not a good end-point and that we would like to use it as a bridge to eventual LVAD. RV function seems adequate for LVAD.   2. Morbid obesity:  Body mass index is 58.91 kg/m. Now being followed at [facility] loss clininc.   3. SVT: had ED visit 10/11 for SVT noted by EMS that converted back to NSR after dose of adenosine. This was in the setting of hypokalemia and hypomagnesemia, at 3.0 and 1.5 respectively. Also in the setting of inotropic therapy w/ milrinone.  No further episodes   4. OSA Continue CPAP.   5. DMII Per PCP.   Follow up in 4 weeks.  Greater than 50% of the (total minutes 25) visit spent in counseling/coordination of care regarding optivol, and the above. Discussed the importance of weight loss.     Tonye Becket, NP 08/17/19

## 2019-08-17 NOTE — Telephone Encounter (Signed)
Per NP, ok to obtain labs on Friday. [employer] [facility] [employer] health and made her aware of same. Appreciative of call.

## 2019-08-19 ENCOUNTER — Other Ambulatory Visit (HOSPITAL_COMMUNITY): Payer: Self-pay

## 2019-08-19 NOTE — Progress Notes (Signed)
Paramedicine Encounter    [name], female    DOB: [birth year], 38 y.o.   MRN: [id number]   Patient Care Team: Otila Back as PCP - General (Internal Medicine) Regan Lemming, MD as PCP - Electrophysiology (Cardiology) Laurey Morale, MD as PCP - Advanced Heart Failure (Cardiology)  Patient Active Problem List   Diagnosis Date Noted  . Acute on chronic systolic CHF (congestive heart failure) (HCC) 06/17/2019  . Obstructive sleep apnea 06/17/2019  . 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 Diabetes mellitus (HCC) 06/17/2019  . Amenorrhea 08/03/2018  . Congestive heart failure (HCC) 06/29/2018  . Hypotension 06/07/2018  . Rhinovirus infection 06/06/2018  . Asthma 06/05/2018  . Acute respiratory failure with hypoxia (HCC) 06/05/2018  . Hypokalemia 06/05/2018  . Snoring 09/23/2016  . Cough 04/10/2016  . 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 Tobacco abuse 01/25/2016  . Acute on chronic combined systolic and diastolic CHF (congestive heart failure) (HCC) 01/25/2016  . Mitral regurgitation   . Abnormal EKG-inf TWI 01/15/2016  . Positive D dimer-CTA neg for PE 01/15/2016  . Morbid obesity- BMI 57 01/14/2016  . Abdominal pain 01/14/2016  . 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 At risk for sleep apnea 01/14/2016  . Cardiomyopathy (HCC) 11/01/2015  . Morbid obesity with BMI of 50.0-59.9, adult (HCC) 08/16/2015    Current Outpatient Medications:  .  albuterol (PROAIR HFA) 108 (90 Base) MCG/ACT inhaler, Inhale 2 puffs into the lungs every 6 (six) hours as needed for wheezing or shortness of breath., Disp: , Rfl:  .  atorvastatin (LIPITOR) 10 MG tablet, Take 1 tablet (10 mg total) by mouth daily., Disp: 90 tablet, Rfl: 3 .  carvedilol (COREG) 3.125 MG tablet, Take 1 tablet (3.125 mg total) by mouth 2 (two) times daily with a meal., Disp: 60 tablet, Rfl: 5 .  digoxin (LANOXIN) 0.125 MG tablet, Take 1 tablet (0.125  mg total) by mouth daily., Disp: 90 tablet, Rfl: 3 .  glipiZIDE (GLUCOTROL) 5 MG tablet, Take 1 tablet (5 mg total) by mouth daily., Disp: 90 tablet, Rfl: 0 .  ipratropium-albuterol (DUONEB) 0.5-2.5 (3) MG/3ML SOLN, Take 3 mLs by nebulization every 4 (four) hours as needed., Disp: 360 mL, Rfl: 0 .  ivabradine (CORLANOR) 7.5 MG TABS tablet, Take 1 tablet (7.5 mg total) by mouth 2 (two) times daily with a meal., Disp: 60 tablet, Rfl: 3 .  Magnesium Oxide 400 (240 Mg) MG TABS, Take 1 tablet (400 mg total) by mouth 2 (two) times daily., Disp: 60 tablet, Rfl: 6 .  metolazone (ZAROXOLYN) 2.5 MG tablet, Take 1 tablet (2.5 mg total) by mouth once a week., Disp: 10 tablet, Rfl: 0 .  milrinone (PRIMACOR) 20 MG/100 ML SOLN infusion, Inject 0.0414 mg/min into the vein continuous., Disp:  , Rfl:  .  potassium chloride SA (KLOR-CON) 20 MEQ tablet, Take 3 tablets (60 mEq total) by mouth 3 (three) times daily. Take an additional with metolazone., Disp: 280 tablet, Rfl: 3 .  PRESCRIPTION MEDICATION, CPAP- AT BEDTIME, Disp: , Rfl:  .  sacubitril-valsartan (ENTRESTO) 24-26 MG, Take 1 tablet by mouth 2 (two) times daily., Disp: 60 tablet, Rfl: 5 .  spironolactone (ALDACTONE) 25 MG tablet, Take 1 tablet (25 mg total) by mouth daily., Disp: 90 tablet, Rfl: 3 .  torsemide (DEMADEX) 20 MG tablet, Take 4 tablets (80 mg total) by mouth 2 (two) times daily., Disp: 720 tablet, Rfl: 3 Allergies  Allergen Reactions  . Metformin And Related Diarrhea  And caused severe "dry mouth," also      Social History   Socioeconomic History  . Marital status: Single    Spouse name: Not on file  . Number of children: 2  . Years of education: Not on file  . Highest education level: Not on file  Occupational History  . Not on file  Social Needs  . Financial resource strain: Not very hard  . Food insecurity    Worry: Never true    Inability: Never true  . Transportation needs    Medical: Yes    Non-medical: Yes   Tobacco Use  . Smoking status: Former Smoker    Quit date: 10/19/2015    Years since quitting: 3.8  . Smokeless tobacco: Never Used  Substance and Sexual Activity  . Alcohol use: Yes    Alcohol/week: 0.0 standard drinks    Comment: occasionally  . Drug use: Yes    Types: Marijuana    Comment: only socially  . Sexual activity: Yes  Lifestyle  . Physical activity    Days per week: Not on file    Minutes per session: Not on file  . Stress: Not on file  Relationships  . Social Herbalist on phone: Not on file    Gets together: Not on file    Attends religious service: Not on file    Active member of club or organization: Not on file    Attends meetings of clubs or organizations: Not on file    Relationship status: Not on file  . Intimate partner violence    Fear of current or ex partner: Not on file    Emotionally abused: Not on file    Physically abused: Not on file    Forced sexual activity: Not on file  Other Topics Concern  . Not on file  Social History Narrative  . Not on file    Physical Exam      Future Appointments  Date Time Provider [place]  09/21/2019 10:30 AM [facility] PA/NP [facility] None  10/25/2019  7:45 AM [facility] DEVICE REMOTES [facility]  11/17/2019 10:20 AM Larey Dresser, MD [facility] None  01/24/2020  7:45 AM [facility] DEVICE REMOTES [facility]  04/24/2020  7:45 AM [facility] DEVICE REMOTES [facility]  07/24/2020  7:45 AM [facility] DEVICE REMOTES [facility]    BP 110/80   Pulse 92   Resp 18   Wt (!) 351 lb (159.2 kg)   SpO2 98%   BMI 58.41 kg/m   Weight yesterday-354 Last visit weight-358 @ clinic   Came out today for f/u from clinic visit. Her potassium and mag was low. She had been doing 26meq BID not TID-she had been following the directions on the bottle not the most recent list from the office. So that was corrected. She states she has not missed any doses of  her meds.  She reports that she had chest cramping yesterday.  Her magnesium was low as well and she reports she had been taking those BID. She denies missing any doses of her meds. She taking half mag BID not whole tab BID.  She got the corlanor p/u and placed the correct dose in her pill box. We discussed this before as far as her following the paper directions not the bottles.  She states her pharmacy always gives her trouble filling the entresto.   Her regimen for AM dose is potassium, magnesium, entresto, corlanor, carvedilol  Afternoon is dig/spiro/atorvastatin/glipizide/potassium  Evening dose is potassium/carvedilol/entresto  She denies any c/p episodes since yesterday. She said it lasted approx for several hours. She said yesterday was a stressful day for her and at bedtime she took tylenol and it went away.     Kerry Hough, EMT-Paramedic [phone] [facility] Paramedic  08/19/19

## 2019-08-20 ENCOUNTER — Telehealth (HOSPITAL_COMMUNITY): Payer: Self-pay | Admitting: *Deleted

## 2019-08-20 NOTE — Telephone Encounter (Signed)
Previous Weeks' Goals:  - She is going to reach out to her therapist for more professional help for her tearful emotions. Appointment setup for Thursday.  - Continue walking 5 days a week (weekdays) for 30 minutes (RPE 11-13) walks at 2pm.  - Work to eat on her set schedule and record in journal - Walk stairs additional 2x each day-once in the morning, once in the evening (to break up her activity) - One Meal with plain oatmeal with cinnamon, bananas, berries and vanilla as a breakfast option Last Week's goal:  - Walk stairs additional 2x each day-once in the morning, once in the evening (to break up her activity) - Continue walking 5 days a week (weekdays) for 30 minutes (RPE 11-13) walks at 2pm.  - Work to eat on her set schedule and record weight in journal - Long-term she wants to work on her budget    Progress Note:  - Today's weight 351 lbs---  (Starting weight 363) - She is walking every day and has the thought that she needs to walk a faster pace in the afternoon - She says the stairs have gotten easier for her and that she can go up and down them 2x before she needs to take a break.  - She has downloaded the [employer] "MapMyWalks" app to track her walks and how long it takes her. She downloaded and set this up on her phone during the appointment today.  - We went through how to use the app and when to record during her exercise bouts throughout the day.  - She has started taking protein shakes for meal replacements up to 2x a day. She is finding this necessary until she receives food stamps.  - She has a follow-up virtual appointment with her therapist tomorrow - She likes to do DIY projects to relieve stress and wants to explore this as a reward option for herself   Achievements:  - She feels like the stairs have gotten easier for her - She feels proud of her weight loss from 363  - She has been successful in eating on her schedule and she feels very enthusiastic  - She has  been going to bed earlier and sleeping better and wakes up happier    Challenges/Frustrations:  - She is frustrated because she is unsure of where she is in her weight loss journey. She is unclear of where her starting point was, and therefore she is not getting small rewards along the way.  - She does not feel like she is getting anywhere with her weight loss.  - 50 lbs feel far away and very daunting for her - Her house guests are complicating her family dynamic and understanding of her health with her kids    Next [facility] goals:  - Walk stairs additional 2x each day-once in the morning, once in the evening (to break up her activity) - Continue walking 5 days a week (weekdays) for 35 minutes (RPE 11-13) walks at 2pm.  - Track using the walking app when she walks stairs and afternoon walk and report to [facility] next week  Next Session:   Wednesday 12/9 in the am hours (via Telephone Encounter)    Landis Martins, MS, ACSM-RCEP Clinical Exercise Physiologist

## 2019-08-24 ENCOUNTER — Telehealth (HOSPITAL_COMMUNITY): Payer: Self-pay | Admitting: Licensed Clinical Social Worker

## 2019-08-24 NOTE — Telephone Encounter (Signed)
CSW called pt to have conversation about current weightloss progress and need to increase our efforts.  Informed pt that we have to show justification for continuing patient on milrinone and that this would not just be an indefinite amount of time as the milrinone is expensive to supply at home.    Patient has been home for almost 2 months but has not lost very much weight at this time- the concern is that if patient does not start showing more progress towards her goal of losing 50lbs the hospital might have to stop paying for the milrinone supplies.  CSW discussed this frankly with the patient and discussed if patient could find other ways to increase weight loss efforts.  CSW updated community paramedic/exercise physiologist/ senior clinic CSW and providers.  We will have a care plan meeting with the patient next week in clinic to discuss further.  Pt also mentioned one of her barriers has been affording food- stated that she is almost out but she gets food stamps tomorrow- pt does not like asking for help so hadn't informed care team of these concerns.  Pt feels as if it is hard to afford all the healthy food that she needs to buy.   CSW contacted [employer] who supplied CSW with current list of food pick up opportunities- CSW emailed patient this list so she can pick up fresh food to supplement her food stamp supplies.  CSW will continue to follow and assist as needed  Jorge Ny, [facility] Desk#: [phone] Cell#: [phone]

## 2019-08-25 ENCOUNTER — Other Ambulatory Visit (HOSPITAL_COMMUNITY): Payer: Self-pay | Admitting: Cardiology

## 2019-08-25 DIAGNOSIS — I5023 Acute on chronic systolic (congestive) heart failure: Secondary | ICD-10-CM

## 2019-08-26 ENCOUNTER — Other Ambulatory Visit (HOSPITAL_COMMUNITY): Payer: Self-pay

## 2019-08-26 NOTE — Progress Notes (Signed)
Paramedicine Encounter    Patient ID: Tangella Waldrop, female    DOB: [birth year], 38 y.o.   MRN: [id number]   Patient Care Team: Otila Back as PCP - General (Internal Medicine) Regan Lemming, MD as PCP - Electrophysiology (Cardiology) Laurey Morale, MD as PCP - Advanced Heart Failure (Cardiology)  Patient Active Problem List   Diagnosis Date Noted  . Acute on chronic systolic CHF (congestive heart failure) (HCC) 06/17/2019  . Obstructive sleep apnea 06/17/2019  . 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 Diabetes mellitus (HCC) 06/17/2019  . Amenorrhea 08/03/2018  . Congestive heart failure (HCC) 06/29/2018  . Hypotension 06/07/2018  . Rhinovirus infection 06/06/2018  . Asthma 06/05/2018  . Acute respiratory failure with hypoxia (HCC) 06/05/2018  . Hypokalemia 06/05/2018  . Snoring 09/23/2016  . Cough 04/10/2016  . 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 Tobacco abuse 01/25/2016  . Acute on chronic combined systolic and diastolic CHF (congestive heart failure) (HCC) 01/25/2016  . Mitral regurgitation   . Abnormal EKG-inf TWI 01/15/2016  . Positive D dimer-CTA neg for PE 01/15/2016  . Morbid obesity- BMI 57 01/14/2016  . Abdominal pain 01/14/2016  . 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 At risk for sleep apnea 01/14/2016  . Cardiomyopathy (HCC) 11/01/2015  . Morbid obesity with BMI of 50.0-59.9, adult (HCC) 08/16/2015    Current Outpatient Medications:  .  atorvastatin (LIPITOR) 10 MG tablet, Take 1 tablet (10 mg total) by mouth daily., Disp: 90 tablet, Rfl: 3 .  carvedilol (COREG) 3.125 MG tablet, Take 1 tablet (3.125 mg total) by mouth 2 (two) times daily with a meal., Disp: 60 tablet, Rfl: 5 .  digoxin (LANOXIN) 0.125 MG tablet, Take 1 tablet (0.125 mg total) by mouth daily., Disp: 90 tablet, Rfl: 3 .  glipiZIDE (GLUCOTROL) 5 MG tablet, Take 1 tablet (5 mg total) by mouth daily., Disp: 90 tablet, Rfl: 0 .   ivabradine (CORLANOR) 7.5 MG TABS tablet, Take 1 tablet (7.5 mg total) by mouth 2 (two) times daily with a meal., Disp: 60 tablet, Rfl: 3 .  Magnesium Oxide 400 (240 Mg) MG TABS, Take 1 tablet (400 mg total) by mouth 2 (two) times daily., Disp: 60 tablet, Rfl: 6 .  metolazone (ZAROXOLYN) 2.5 MG tablet, Take 1 tablet (2.5 mg total) by mouth once a week., Disp: 10 tablet, Rfl: 0 .  milrinone (PRIMACOR) 20 MG/100 ML SOLN infusion, Inject 0.0414 mg/min into the vein continuous., Disp:  , Rfl:  .  potassium chloride SA (KLOR-CON) 20 MEQ tablet, Take 3 tablets (60 mEq total) by mouth 3 (three) times daily. Take an additional with metolazone., Disp: 280 tablet, Rfl: 3 .  sacubitril-valsartan (ENTRESTO) 24-26 MG, Take 1 tablet by mouth 2 (two) times daily., Disp: 60 tablet, Rfl: 5 .  spironolactone (ALDACTONE) 25 MG tablet, Take 1 tablet (25 mg total) by mouth daily., Disp: 90 tablet, Rfl: 3 .  torsemide (DEMADEX) 20 MG tablet, Take 4 tablets (80 mg total) by mouth 2 (two) times daily., Disp: 720 tablet, Rfl: 3 .  albuterol (PROAIR HFA) 108 (90 Base) MCG/ACT inhaler, Inhale 2 puffs into the lungs every 6 (six) hours as needed for wheezing or shortness of breath., Disp: , Rfl:  .  ipratropium-albuterol (DUONEB) 0.5-2.5 (3) MG/3ML SOLN, Take 3 mLs by nebulization every 4 (four) hours as needed. (Patient not taking: Reported on 08/26/2019), Disp: 360 mL, Rfl: 0 .  PRESCRIPTION MEDICATION, CPAP- AT BEDTIME, Disp: , Rfl:  Allergies  Allergen Reactions  .  Metformin And Related Diarrhea    And caused severe "dry mouth," also      Social History   Socioeconomic History  . Marital status: Single    Spouse name: Not on file  . Number of children: 2  . Years of education: Not on file  . Highest education level: Not on file  Occupational History  . Not on file  Tobacco Use  . Smoking status: Former Smoker    Quit date: 10/19/2015    Years since quitting: 3.8  . Smokeless tobacco: Never Used   Substance and Sexual Activity  . Alcohol use: Yes    Alcohol/week: 0.0 standard drinks    Comment: occasionally  . Drug use: Yes    Types: Marijuana    Comment: only socially  . Sexual activity: Yes  Other Topics Concern  . Not on file  Social History Narrative  . Not on file   Social Determinants of Health   Financial Resource Strain: Low Risk   . Difficulty of Paying Living Expenses: Not very hard  Food Insecurity: No Food Insecurity  . Worried About Charity fundraiser in the Last Year: Never true  . Ran Out of Food in the Last Year: Never true  Transportation Needs: Unmet Transportation Needs  . Lack of Transportation (Medical): Yes  . Lack of Transportation (Non-Medical): Yes  Physical Activity:   . Days of Exercise per Week: Not on file  . Minutes of Exercise per Session: Not on file  Stress:   . Feeling of Stress : Not on file  Social Connections:   . Frequency of Communication with Friends and Family: Not on file  . Frequency of Social Gatherings with Friends and Family: Not on file  . Attends Religious Services: Not on file  . Active Member of Clubs or Organizations: Not on file  . Attends Archivist Meetings: Not on file  . Marital Status: Not on file  Intimate Partner Violence:   . Fear of Current or Ex-Partner: Not on file  . Emotionally Abused: Not on file  . Physically Abused: Not on file  . Sexually Abused: Not on file    Physical Exam      Future Appointments  Date Time Provider [place]  08/31/2019 11:00 AM [facility] PA/NP [facility] None  09/21/2019 10:30 AM [facility] PA/NP [facility] None  10/25/2019  7:45 AM [facility] DEVICE REMOTES [facility]  11/17/2019 10:20 AM Larey Dresser, MD [facility] None  01/24/2020  7:45 AM [facility] DEVICE REMOTES [facility]  04/24/2020  7:45 AM [facility] DEVICE REMOTES [facility]  07/24/2020  7:45 AM [facility] DEVICE REMOTES [facility]    BP  100/78   Pulse 78   Resp 18   Wt (!) 351 lb (159.2 kg)   SpO2 99%   BMI 58.41 kg/m   Weight yesterday-351 Last visit weight-351  Pt reports breathing is doing ok.  She has company back in the home-she reported to me quietly that her company's power had been shut off and had nowhere else to go.  She does seem stressed about them there as stephanie's grandkids are there and its a lot more busy than she is accustomed to and that causes pt to have increased stress.  She has had h/a since yesterday to her forehead area.  The tylenol does help ease it a little bit. (she is thinking it is stressed related)  She did take her meds this morn-except torsemide b/c she was out and about  and will take them here in a few.  She denies c/p. No dizziness.  Assisted her with filling her pill box up.  meds verified and pill box refilled.   Will f/u next week.   Kerry Hough, EMT-Paramedic [phone] [facility] Paramedic  08/26/19

## 2019-08-27 ENCOUNTER — Telehealth (HOSPITAL_COMMUNITY): Payer: Self-pay | Admitting: *Deleted

## 2019-08-27 DIAGNOSIS — A5031 Late congenital syphilitic interstitial keratitis: Secondary | ICD-10-CM | POA: Diagnosis not present

## 2019-08-27 DIAGNOSIS — Z6841 Body Mass Index (BMI) 40.0 and over, adult: Secondary | ICD-10-CM | POA: Diagnosis not present

## 2019-08-27 DIAGNOSIS — I5023 Acute on chronic systolic (congestive) heart failure: Secondary | ICD-10-CM | POA: Diagnosis not present

## 2019-08-27 DIAGNOSIS — I340 Nonrheumatic mitral (valve) insufficiency: Secondary | ICD-10-CM | POA: Diagnosis not present

## 2019-08-27 DIAGNOSIS — F17210 Nicotine dependence, cigarettes, uncomplicated: Secondary | ICD-10-CM | POA: Diagnosis not present

## 2019-08-27 DIAGNOSIS — E119 Type 2 diabetes mellitus without complications: Secondary | ICD-10-CM | POA: Diagnosis not present

## 2019-08-27 DIAGNOSIS — Z452 Encounter for adjustment and management of vascular access device: Secondary | ICD-10-CM | POA: Diagnosis not present

## 2019-08-27 DIAGNOSIS — Z79899 Other long term (current) drug therapy: Secondary | ICD-10-CM | POA: Diagnosis not present

## 2019-08-27 DIAGNOSIS — Z7984 Long term (current) use of oral hypoglycemic drugs: Secondary | ICD-10-CM | POA: Diagnosis not present

## 2019-08-27 DIAGNOSIS — Z5181 Encounter for therapeutic drug level monitoring: Secondary | ICD-10-CM | POA: Diagnosis not present

## 2019-08-27 DIAGNOSIS — O903 Peripartum cardiomyopathy: Secondary | ICD-10-CM | POA: Diagnosis not present

## 2019-08-27 DIAGNOSIS — J45909 Unspecified asthma, uncomplicated: Secondary | ICD-10-CM | POA: Diagnosis not present

## 2019-08-27 NOTE — Telephone Encounter (Signed)
Patient follow-up on Wednesday, she requested to have her weekly health and wellness coaching follow-up on Friday instead due to his time constraints. No answer today. Left message and will try again early next week.    Landis Martins, MS, ACSM-RCEP Clinical Exercise Physiologist

## 2019-08-30 DIAGNOSIS — Z713 Dietary counseling and surveillance: Secondary | ICD-10-CM | POA: Diagnosis not present

## 2019-08-30 DIAGNOSIS — Z6841 Body Mass Index (BMI) 40.0 and over, adult: Secondary | ICD-10-CM | POA: Diagnosis not present

## 2019-08-31 ENCOUNTER — Ambulatory Visit (HOSPITAL_COMMUNITY)
Admission: RE | Admit: 2019-08-31 | Discharge: 2019-08-31 | Disposition: A | Discharge status: Home / Self Care | Payer: Medicaid Other | Source: Ambulatory Visit | Attending: Adult Health | Admitting: Adult Health

## 2019-08-31 ENCOUNTER — Other Ambulatory Visit: Payer: Self-pay

## 2019-08-31 ENCOUNTER — Encounter (HOSPITAL_COMMUNITY): Payer: Self-pay

## 2019-08-31 ENCOUNTER — Other Ambulatory Visit (HOSPITAL_COMMUNITY): Payer: Self-pay

## 2019-08-31 VITALS — BP 138/70 | HR 90 | Wt 357.0 lb

## 2019-08-31 DIAGNOSIS — Z6841 Body Mass Index (BMI) 40.0 and over, adult: Secondary | ICD-10-CM | POA: Insufficient documentation

## 2019-08-31 DIAGNOSIS — G4730 Sleep apnea, unspecified: Secondary | ICD-10-CM

## 2019-08-31 DIAGNOSIS — I428 Other cardiomyopathies: Secondary | ICD-10-CM | POA: Diagnosis not present

## 2019-08-31 DIAGNOSIS — J45909 Unspecified asthma, uncomplicated: Secondary | ICD-10-CM | POA: Diagnosis not present

## 2019-08-31 DIAGNOSIS — Z7984 Long term (current) use of oral hypoglycemic drugs: Secondary | ICD-10-CM | POA: Insufficient documentation

## 2019-08-31 DIAGNOSIS — G4733 Obstructive sleep apnea (adult) (pediatric): Secondary | ICD-10-CM | POA: Insufficient documentation

## 2019-08-31 DIAGNOSIS — I471 Supraventricular tachycardia: Secondary | ICD-10-CM | POA: Insufficient documentation

## 2019-08-31 DIAGNOSIS — Z8249 Family history of ischemic heart disease and other diseases of the circulatory system: Secondary | ICD-10-CM | POA: Diagnosis not present

## 2019-08-31 DIAGNOSIS — Z87891 Personal history of nicotine dependence: Secondary | ICD-10-CM | POA: Diagnosis not present

## 2019-08-31 DIAGNOSIS — Z79899 Other long term (current) drug therapy: Secondary | ICD-10-CM | POA: Insufficient documentation

## 2019-08-31 DIAGNOSIS — E119 Type 2 diabetes mellitus without complications: Secondary | ICD-10-CM | POA: Insufficient documentation

## 2019-08-31 DIAGNOSIS — I5022 Chronic systolic (congestive) heart failure: Secondary | ICD-10-CM | POA: Diagnosis not present

## 2019-08-31 DIAGNOSIS — Z833 Family history of diabetes mellitus: Secondary | ICD-10-CM | POA: Diagnosis not present

## 2019-08-31 NOTE — Patient Instructions (Signed)
It was great to see you today! No medication changes are needed at this time.  Keep follow up as scheduled  Do the following things EVERYDAY: 1) Weigh yourself in the morning before breakfast. Write it down and keep it in a log. 2) Take your medicines as prescribed 3) Eat low salt foods--Limit salt (sodium) to 2000 mg per day.  4) Stay as active as you can everyday 5) Limit all fluids for the day to less than 2 liters  At the [facility], you and your health needs are our priority. As part of our continuing mission to provide you with exceptional heart care, we have created designated Provider Care Teams. These Care Teams include your primary Cardiologist (physician) and Advanced Practice Providers (APPs- Physician Assistants and Nurse Practitioners) who all work together to provide you with the care you need, when you need it.   You may see any of the following providers on your designated Care Team at your next follow up: Marland Kitchen Dr Glori Bickers . Dr Loralie Champagne . Darrick Grinder, NP . Lyda Jester, PA . [name], PharmD   Please be sure to bring in all your medications bottles to every appointment.

## 2019-08-31 NOTE — Progress Notes (Signed)
Paramedicine Encounter   [name] , female,   DOB: 1981-03-14,38 y.o.,  MRN: [id number]   Met patient in clinic today with provider.   Weight @ clinic-357  B/p-138/70 p-90 sp02-98%  Pt reports she is doing ok. Her weight is not showing much progress per clinic scales.  Will continue to encourage her and support.  Still not using CPAP nightly. She states it is hard for her to get used to it. I encouraged her to use it as much as possible. Every night.  No med changes today.   [name], [place] 08/31/2019

## 2019-08-31 NOTE — Progress Notes (Signed)
Advanced Heart Failure Clinic Note   Referring Physician: PCP: Eunice Blase, PA-C PCP-Cardiologist: Dr. 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   She presented to the ER on 06/17/19 with several days of worsening HF with SOB at rest, marked edema and orthopnea/PND, not responding to increased doses of oral diuretics. She admitted to high levels of water and Gatorade intake.   Her admit wt was 380 lb (Dry wt ~360 lb). She was started on IV Lasix but had initial poor urinary response. PO metolazone added as adjunct. Repeat echo was obtained and showed EF <20%, relatively normal RV, moderate MR. RHC 10/5 with low output (CI 1.8) and elevated PCWP but near normal RA pressure. She was started on inotropic therapy w/ milrinone for low output, 0.25 mcg/kg/min. Also treated w/ Entresto, Coreg, spironolactone, digoxin and ivabradine. She was later transitioned off of IV Lasix to po torsemide 80 mg qam/60 mg qpm.   Unfortunately, she was not a transplant candidate due to her morbid obesity. We discussed her at [facility] and Dr. Donata Clay has seen. She will need significant weight loss to get to LVAD. Will use home milrinone to try to facilitate increased activity to work towards weight loss. She understands that milrinone is not a good end-point and that we would like to use it as a bridge to eventual LVAD. RV function seems adequate for LVAD. Pt is motivated to lose weight. Tunnel cath was placed for home milrinone and home health was arranged. She was discharged home on 10/9 on milrinone 0.25 mcg.   06/27/19 she presented to the [facility] w/ CC of palpitations. Was brought in by EMS and was reportedly in SVT with HR in the 180s. She was given adenosine and converted to NSR prior to arrival to the ED. In the ED she was stable and symptoms had resolved. Labs showed normal CBC. BMP  showed hypokalemia (3.0), hyperglycemia (243), mildly elevated BUN/SCr, and low Mag, 1.5. She was given K and Mg supplementation and discharged home from the ED.   Today she returns for HF follow up.Overall feeling fine stressed out with multiple family members living in her home.  Denies SOB/PND/Orthopnea. Using CPAP every night. No issues with a PICC.  Appetite ok. No fever or chills. Weight at home 351-354 pounds. Able to walk 1 mile in 31 minutes.  Taking all medications.  Remains on milrinone 0.25 mcg. Followed by [facility] for milrinone and [facility]. Continue HF Paramedicine.   RHC Procedural Findings (10/5): Hemodynamics (mmHg) RA mean 7 RV 49/10 PA 65/25 mean 45 PCWP mean 27 Oxygen saturations: PA 57% AO 100% Cardiac Output (Fick) 4.65  Cardiac Index (Fick) 1.82 PVR 3.87 WU PAPI 5.7  2D Echo 06/18/19  Left ventricular ejection fraction, by visual estimation, is <20%. The left ventricle has severely decreased function. Severely increased left ventricular size. There is no left ventricular hypertrophy. Severe global hypokinesis. 2. Left ventricular diastolic Doppler parameters are indeterminate pattern of LV diastolic filling. 3. Global right ventricle has low normal systolic function.The right ventricular size is normal. No increase in right ventricular wall thickness. 4. Left atrial size was severely dilated. 5. Right atrial size was mildly dilated. 6. The mitral valve is abnormal. Moderate mitral valve regurgitation. 7. The tricuspid valve is grossly normal. Tricuspid valve regurgitation is trivial. 8. The aortic valve is tricuspid Aortic valve regurgitation was  not visualized by color flow Doppler. 9. The pulmonic valve was grossly normal. Pulmonic valve regurgitation is not visualized by color flow Doppler. 10. Mildly elevated pulmonary artery systolic pressure. 11. A pacer wire is visualized. 12. The inferior vena cava is dilated in size with >50% respiratory  variability, suggesting right atrial pressure of 8 mmHg. 13. The interatrial septum was not well visualized.+   Past Medical History:  Diagnosis Date  . Asthma   . Chronic systolic CHF (congestive heart failure) (HCC)    a. ECHO 01/15/2016 EF 10-15%. 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  . Diabetes mellitus (HCC) 06/17/2019  . Hearing loss    bilateral  . Mitral regurgitation    a. severe, felt to be functional 2/2 LV dilation   . Morbid obesity (HCC)   . Snoring     Current Outpatient Medications  Medication Sig Dispense Refill  . albuterol (PROAIR HFA) 108 (90 Base) MCG/ACT inhaler Inhale 2 puffs into the lungs every 6 (six) hours as needed for wheezing or shortness of breath.    Marland Kitchen atorvastatin (LIPITOR) 10 MG tablet Take 1 tablet (10 mg total) by mouth daily. 90 tablet 3  . carvedilol (COREG) 3.125 MG tablet Take 1 tablet (3.125 mg total) by mouth 2 (two) times daily with a meal. 60 tablet 5  . digoxin (LANOXIN) 0.125 MG tablet Take 1 tablet (0.125 mg total) by mouth daily. 90 tablet 3  . glipiZIDE (GLUCOTROL) 5 MG tablet Take 1 tablet (5 mg total) by mouth daily. 90 tablet 0  . ipratropium-albuterol (DUONEB) 0.5-2.5 (3) MG/3ML SOLN Take 3 mLs by nebulization every 4 (four) hours as needed. 360 mL 0  . ivabradine (CORLANOR) 7.5 MG TABS tablet Take 1 tablet (7.5 mg total) by mouth 2 (two) times daily with a meal. 60 tablet 3  . Magnesium Oxide 400 (240 Mg) MG TABS Take 1 tablet (400 mg total) by mouth 2 (two) times daily. 60 tablet 6  . metolazone (ZAROXOLYN) 2.5 MG tablet Take 1 tablet (2.5 mg total) by mouth once a week. 10 tablet 0  . milrinone (PRIMACOR) 20 MG/100 ML SOLN infusion Inject 0.0414 mg/min into the vein continuous.    . potassium chloride SA (KLOR-CON) 20 MEQ tablet Take 3 tablets (60 mEq total) by mouth 3 (three) times daily. Take an additional with metolazone. 280 tablet 3  . PRESCRIPTION MEDICATION CPAP- AT BEDTIME    . sacubitril-valsartan (ENTRESTO)  24-26 MG Take 1 tablet by mouth 2 (two) times daily. 60 tablet 5  . spironolactone (ALDACTONE) 25 MG tablet Take 1 tablet (25 mg total) by mouth daily. 90 tablet 3  . torsemide (DEMADEX) 20 MG tablet Take 4 tablets (80 mg total) by mouth 2 (two) times daily. 720 tablet 3   No current facility-administered medications for this encounter.    Allergies  Allergen Reactions  . Metformin And Related Diarrhea    And caused severe "dry mouth," also      Social History   Socioeconomic History  . Marital status: Single    Spouse name: Not on file  . Number of children: 2  . Years of education: Not on file  . Highest education level: Not on file  Occupational History  . Not on file  Tobacco Use  . Smoking status: Former Smoker    Quit date: 10/19/2015    Years since quitting: 3.8  . Smokeless tobacco: Never Used  Substance and Sexual Activity  . Alcohol use: Yes  Alcohol/week: 0.0 standard drinks    Comment: occasionally  . Drug use: Yes    Types: Marijuana    Comment: only socially  . Sexual activity: Yes  Other Topics Concern  . Not on file  Social History Narrative  . Not on file   Social Determinants of Health   Financial Resource Strain:   . Difficulty of Paying Living Expenses: Not on file  Food Insecurity:   . Worried About Charity fundraiser in the Last Year: Not on file  . Ran Out of Food in the Last Year: Not on file  Transportation Needs:   . Lack of Transportation (Medical): Not on file  . Lack of Transportation (Non-Medical): Not on file  Physical Activity:   . Days of Exercise per Week: Not on file  . Minutes of Exercise per Session: Not on file  Stress:   . Feeling of Stress : Not on file  Social Connections:   . Frequency of Communication with Friends and Family: Not on file  . Frequency of Social Gatherings with Friends and Family: Not on file  . Attends Religious Services: Not on file  . Active Member of Clubs or Organizations: Not on file  .  Attends Archivist Meetings: Not on file  . Marital Status: Not on file  Intimate Partner Violence:   . Fear of Current or Ex-Partner: Not on file  . Emotionally Abused: Not on file  . Physically Abused: Not on file  . Sexually Abused: Not on file      Family History  Problem Relation Age of Onset  . Heart attack Mother   . Hyperlipidemia Father   . Diabetes Brother     Vitals:   08/31/19 1116  BP: 138/70  Pulse: 90  SpO2: 98%  Weight: (!) 161.9 kg (357 lb)   Wt Readings from Last 3 Encounters:  08/31/19 (!) 161.9 kg (357 lb)  08/26/19 (!) 159.2 kg (351 lb)  08/19/19 (!) 159.2 kg (351 lb)    PHYSICAL EXAM: General:  Well appearing. No resp difficulty HEENT: normal Neck: supple. no JVD. Carotids 2+ bilat; no bruits. No lymphadenopathy or thryomegaly appreciated. Cor: PMI nondisplaced. Regular rate & rhythm. No rubs, gallops or murmurs. R upper chest tunneled catheter Lungs: clear Abdomen: soft, nontender, nondistended. No hepatosplenomegaly. No bruits or masses. Good bowel sounds. Extremities: no cyanosis, clubbing, rash, edema Neuro: alert & orientedx3, cranial nerves grossly intact. moves all 4 extremities w/o difficulty. Affect pleasant   ASSESSMENT & PLAN: 1. Chronic Systolic HF w/ Low Output/ NICM: Echo 06/2019 with EF < 20%, relatively normal RV, moderate MR. NICM with Medtronic ICD. Recent acute exacerbation in the setting of dietary indiscretion w/ sodium. Massive diuresis w/ IV Lasix, ~17 lb. D/c dry wt 357 lb.  RHC 10/5 with low output (CI 1.8) and elevated PCWP but near normal RA pressure, started on milrinone 0.25 w/ plans to continue home milrinone as bridge to possible LVAD if significant weight loss.  - NYHA II on milrinone therapy.  -volume status stable. Continue torsemide 80 mg twice a day.  -  Continue Coreg 3.125 mg bid.  - Continue Entresto 24/26 bid.  - Continue ivabradine to 7.5 mg twice a day.   - Continue spironolactone 25 daily.  -  Continue digoxin 0.125 mg - VAD consideration: Her size and social support will be issues.  She would not be a transplant candidate at this point due to weight. We discussed her at [facility], Dr. Lucianne Lei  Maudie Flakesrigt has seen.  She will need significant weight loss to get to LVAD.  Will use home milrinone to try to facilitate increased activity to work towards weight loss.  She understands that milrinone is not a good end-point and that we would like to use it as a bridge to eventual LVAD. RV function seems adequate for LVAD.   2. Morbid obesity:  Body mass index is 59.41 kg/m. [facility]  Discussed importance of weight to qualify for advanced therapies.   3. SVT: had ED visit 10/11 for SVT noted by EMS that converted back to NSR after dose of adenosine. This was in the setting of hypokalemia and hypomagnesemia, at 3.0 and 1.5 respectively. Also in the setting of inotropic therapy w/ milrinone.  No further episodes   4. OSA Continue CPAP.   5. DMII Per PCP.   Personally discussed with HF Paramedicine and HFSW [place] current plan. She is utilizing team based care to hopefully give her an opportunity for advanced therapies.  Greater than 50% of the (total minutes 25) visit spent in counseling/coordination of care regarding the above. Continue HH for home milrinone.   Follow up in 4 weeks.    Tonye BecketAmy Thelmer Legler, NP 08/31/19

## 2019-09-01 ENCOUNTER — Telehealth (HOSPITAL_COMMUNITY): Payer: Self-pay | Admitting: Licensed Clinical Social Worker

## 2019-09-01 ENCOUNTER — Telehealth (HOSPITAL_COMMUNITY): Payer: Self-pay | Admitting: *Deleted

## 2019-09-01 NOTE — Telephone Encounter (Signed)
CSW called pt weight loss clinic to provide with Dr. Claris Gladden contact information so their provider can discuss pt weight loss goals with Dr. Aundra Dubin.  Pt weight loss clinic has set slow weight loss goals but due to pt fragile medical condition she needs to lose weight a more rapid weight so that she can become appropriate for LVAD.  Weight Loss clinic provider will reach out to discuss further with Dr. Aundra Dubin.  CSW will continue to follow and assist as needed  Jorge Ny, [place] [facility] Desk#: [phone] Cell#: [phone]

## 2019-09-01 NOTE — Telephone Encounter (Signed)
Last [facility] goal:  - Walk stairs additional 2x each day-once in the morning, once in the evening (to break up her activity) - Continue walking 5 days a week (weekdays) for 35 minutes (RPE 11-13) walks at 2pm.  - Track using the walking app when she walks stairs and afternoon walk and report to [facility] next week  Progress Note:  - Today's weight 353 lbs---  (Starting weight 363) - She has been tracking her walks with the MapMyWalk app  o Thursday 12/10 1.04 miles 31:42 duration  30:37 pace (hilly) o Thursday 12/10 Danced (30 minutes) - tracked on app o Sunday 12/13 1.02 miles 31:37 duration  31:08 pace (more flat- [address]) o Sunday 12/13  Danced (20:18 mins)-tracked on App  o Monday 12/14 Weight loss center exercise--- unable to track on phone  - Rode the bike for 4 minutes with other exercises in the gym  o Tuesday 12/16- only stair walks o Wednesday 12/17-plans to do dance video today (cold)   - She is "loving" the exercise app and tracking walking, and dancing  - She has a downstairs neighbor that is now walking with her and she enjoys the company and distraction  - She is exercising in the morning and evening with 5 minutes each time (2x up and down stairs) - She is still having a meal or two meal replacement shakes daily and is enjoying this, but feels hungry by dinner time.  - She has been following with her therapist and journaling and feels calmer with this treatment  - She likes to do DIY projects to relieve stress and wants to refinish a desk to put in her bedroom  - This is a big journey for her but she is motivated and wants to report big results and success on video for heart and vascular center. She says this is a motivating factor for her.   Achievements: - She feels like the stairs have gotten easier for her - She has a [employer] officer and her stomach used to touch the steering wheel and she didn't wear her seatbelt, now she has more room between her and the steering wheel and is  comfortable buckling her seat completely. She felt more comfortable moving around in her car.  - She has been successful in eating on her schedule and she feels very enthusiastic.  - She has been going to bed earlier and sleeping better and wakes up happier and has more energy  Challenges/Frustrations: - She talked about her meeting with the clinic staff and said this was a tough meeting for her but that she was grateful for her team and their genuine care for her.   - She is hungry by 6pm when she replaces breakfast and lunch with shakes-She replaces both because it is easier- she stated she was "lazy".  - She is frustrated with the discrepancies in exercise instruction with home health nurse- advising her to "not exercise so much" - Challenged with hills- but has started a new exercise route that does not involve the hills  Next [facility] goals:  - Look into joining [employer] near her house  - Continue walking stairs 2x a day, walking/dancing 1x a day for 30-45 mins o Walk 1.5 miles in 30 minutes o Dancing 30 minutes  - Track workouts and email to meBaxter Hire   Next Session:   Wednesday 12/23 in the am hours (via Telephone Encounter)   Time Spent with patient: 50 Minutes     [employer]  Marcello Moores, MS, ACSM-RCEP Clinical Exercise Physiologist/ Health and Wellness Coach

## 2019-09-02 ENCOUNTER — Other Ambulatory Visit (HOSPITAL_COMMUNITY): Payer: Self-pay | Admitting: Adult Health

## 2019-09-02 ENCOUNTER — Other Ambulatory Visit (HOSPITAL_COMMUNITY): Payer: Self-pay

## 2019-09-02 NOTE — Progress Notes (Signed)
Paramedicine Encounter    [name], female    DOB: 13-Jan-1981, 38 y.o.   MRN: [id number]   Patient Care Team: Otila Back as PCP - General (Internal Medicine) Regan Lemming, MD as PCP - Electrophysiology (Cardiology) Laurey Morale, MD as PCP - Advanced Heart Failure (Cardiology)  Patient Active Problem List   Diagnosis Date Noted  . Acute on chronic systolic CHF (congestive heart failure) (HCC) 06/17/2019  . Obstructive sleep apnea 06/17/2019  . 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 Diabetes mellitus (HCC) 06/17/2019  . Amenorrhea 08/03/2018  . Congestive heart failure (HCC) 06/29/2018  . Hypotension 06/07/2018  . Rhinovirus infection 06/06/2018  . Asthma 06/05/2018  . Acute respiratory failure with hypoxia (HCC) 06/05/2018  . Hypokalemia 06/05/2018  . Snoring 09/23/2016  . Cough 04/10/2016  . 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 Tobacco abuse 01/25/2016  . Acute on chronic combined systolic and diastolic CHF (congestive heart failure) (HCC) 01/25/2016  . Mitral regurgitation   . Abnormal EKG-inf TWI 01/15/2016  . Positive D dimer-CTA neg for PE 01/15/2016  . Morbid obesity- BMI 57 01/14/2016  . Abdominal pain 01/14/2016  . 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 At risk for sleep apnea 01/14/2016  . Cardiomyopathy (HCC) 11/01/2015  . Morbid obesity with BMI of 50.0-59.9, adult (HCC) 08/16/2015    Current Outpatient Medications:  .  albuterol (PROAIR HFA) 108 (90 Base) MCG/ACT inhaler, Inhale 2 puffs into the lungs every 6 (six) hours as needed for wheezing or shortness of breath., Disp: , Rfl:  .  atorvastatin (LIPITOR) 10 MG tablet, Take 1 tablet (10 mg total) by mouth daily., Disp: 90 tablet, Rfl: 3 .  carvedilol (COREG) 3.125 MG tablet, Take 1 tablet (3.125 mg total) by mouth 2 (two) times daily with a meal., Disp: 60 tablet, Rfl: 5 .  digoxin (LANOXIN) 0.125 MG tablet, Take 1 tablet (0.125  mg total) by mouth daily., Disp: 90 tablet, Rfl: 3 .  glipiZIDE (GLUCOTROL) 5 MG tablet, Take 1 tablet (5 mg total) by mouth daily., Disp: 90 tablet, Rfl: 0 .  ipratropium-albuterol (DUONEB) 0.5-2.5 (3) MG/3ML SOLN, Take 3 mLs by nebulization every 4 (four) hours as needed., Disp: 360 mL, Rfl: 0 .  ivabradine (CORLANOR) 7.5 MG TABS tablet, Take 1 tablet (7.5 mg total) by mouth 2 (two) times daily with a meal., Disp: 60 tablet, Rfl: 3 .  Magnesium Oxide 400 (240 Mg) MG TABS, Take 1 tablet (400 mg total) by mouth 2 (two) times daily., Disp: 60 tablet, Rfl: 6 .  milrinone (PRIMACOR) 20 MG/100 ML SOLN infusion, Inject 0.0414 mg/min into the vein continuous., Disp:  , Rfl:  .  potassium chloride SA (KLOR-CON) 20 MEQ tablet, Take 3 tablets (60 mEq total) by mouth 3 (three) times daily. Take an additional with metolazone., Disp: 280 tablet, Rfl: 3 .  PRESCRIPTION MEDICATION, CPAP- AT BEDTIME, Disp: , Rfl:  .  sacubitril-valsartan (ENTRESTO) 24-26 MG, Take 1 tablet by mouth 2 (two) times daily., Disp: 60 tablet, Rfl: 5 .  spironolactone (ALDACTONE) 25 MG tablet, Take 1 tablet (25 mg total) by mouth daily., Disp: 90 tablet, Rfl: 3 .  torsemide (DEMADEX) 20 MG tablet, Take 4 tablets (80 mg total) by mouth 2 (two) times daily., Disp: 720 tablet, Rfl: 3 .  metolazone (ZAROXOLYN) 2.5 MG tablet, TAKE 1 TABLET(2.5 MG) BY MOUTH 1 TIME A WEEK, Disp: 10 tablet, Rfl: 0 Allergies  Allergen Reactions  . Metformin And Related Diarrhea  And caused severe "dry mouth," also      Social History   Socioeconomic History  . Marital status: Single    Spouse name: Not on file  . Number of children: 2  . Years of education: Not on file  . Highest education level: Not on file  Occupational History  . Not on file  Tobacco Use  . Smoking status: Former Smoker    Quit date: 10/19/2015    Years since quitting: 3.8  . Smokeless tobacco: Never Used  Substance and Sexual Activity  . Alcohol use: Yes     Alcohol/week: 0.0 standard drinks    Comment: occasionally  . Drug use: Yes    Types: Marijuana    Comment: only socially  . Sexual activity: Yes  Other Topics Concern  . Not on file  Social History Narrative  . Not on file   Social Determinants of Health   Financial Resource Strain:   . Difficulty of Paying Living Expenses: Not on file  Food Insecurity:   . Worried About Charity fundraiser in the Last Year: Not on file  . Ran Out of Food in the Last Year: Not on file  Transportation Needs:   . Lack of Transportation (Medical): Not on file  . Lack of Transportation (Non-Medical): Not on file  Physical Activity:   . Days of Exercise per Week: Not on file  . Minutes of Exercise per Session: Not on file  Stress:   . Feeling of Stress : Not on file  Social Connections:   . Frequency of Communication with Friends and Family: Not on file  . Frequency of Social Gatherings with Friends and Family: Not on file  . Attends Religious Services: Not on file  . Active Member of Clubs or Organizations: Not on file  . Attends Archivist Meetings: Not on file  . Marital Status: Not on file  Intimate Partner Violence:   . Fear of Current or Ex-Partner: Not on file  . Emotionally Abused: Not on file  . Physically Abused: Not on file  . Sexually Abused: Not on file    Physical Exam      Future Appointments  Date Time Provider [place]  09/21/2019 10:30 AM [facility] PA/NP [facility] None  10/25/2019  7:45 AM [facility] DEVICE REMOTES [facility]  11/17/2019 10:20 AM Larey Dresser, MD [facility] None  01/24/2020  7:45 AM [facility] DEVICE REMOTES [facility]  04/24/2020  7:45 AM [facility] DEVICE REMOTES [facility]  07/24/2020  7:45 AM [facility] DEVICE REMOTES [facility]    BP (!) 90/0   Pulse 82   Resp 18   Wt (!) 352 lb (159.7 kg)   SpO2 98%   BMI 58.58 kg/m  B/p standing-80/systolic  CBG [id number]  Weight  yesterday-352 Last visit weight-357 @ clinic   Pt was sweeping floor when I arrived. Her g/f daughter is here and her 3 kids are still here. It was a lot going on in the home.  Pt seems very distracted by this busy-ness, she seems agitated. She reports she doesn't like the floors in the house, cant get them clean enough, she states the walls are dirty.  She went to her therapist yesterday and felt like things went good. Its the therapist that her and her kids go to.  She then started crying, had to step away for a few min to the bathroom. She came back--she reported that she has a meeting tomor with her daughters teacher  about her progress report and still not able to use the tablet school gave her b/c it has no charge cord that works. She plans on returning that tomor as well.  She has a lot going on right now and is very overwhelmed.  She is not interested in seeking medication therapy for her anxiety. I asked if she wanted me to get jenna to call her later on and she would like that. So Eileen Stanford will f/u with her today.   meds verified and pill box refilled.  She reports her breathing is doing good. No edema noted.     Kerry Hough, EMT-Paramedic [phone] [facility] Paramedic  09/02/19

## 2019-09-03 ENCOUNTER — Telehealth (HOSPITAL_COMMUNITY): Payer: Self-pay | Admitting: Cardiology

## 2019-09-03 NOTE — Telephone Encounter (Signed)
Angel,RN with [facility] Called to report patient was unable to have labs drawn, PICC line bandage changed, and medication changed as she went to an appt for her children in [place]. [place] will attempt visit with patient 09/04/19  Attempted to return call to [facility] as requested no answer and unable to leave message as VM was full

## 2019-09-04 ENCOUNTER — Encounter (HOSPITAL_COMMUNITY): Payer: Self-pay

## 2019-09-04 ENCOUNTER — Other Ambulatory Visit: Payer: Self-pay

## 2019-09-04 ENCOUNTER — Ambulatory Visit (HOSPITAL_COMMUNITY)
Admission: EM | Admit: 2019-09-04 | Discharge: 2019-09-04 | Disposition: A | Discharge status: Home / Self Care | Payer: Medicaid Other | Attending: Family Medicine | Admitting: Family Medicine

## 2019-09-04 DIAGNOSIS — Z888 Allergy status to other drugs, medicaments and biological substances status: Secondary | ICD-10-CM | POA: Diagnosis not present

## 2019-09-04 DIAGNOSIS — R0981 Nasal congestion: Secondary | ICD-10-CM | POA: Diagnosis not present

## 2019-09-04 DIAGNOSIS — Z20828 Contact with and (suspected) exposure to other viral communicable diseases: Secondary | ICD-10-CM | POA: Diagnosis not present

## 2019-09-04 DIAGNOSIS — R0982 Postnasal drip: Secondary | ICD-10-CM | POA: Diagnosis not present

## 2019-09-04 DIAGNOSIS — E119 Type 2 diabetes mellitus without complications: Secondary | ICD-10-CM | POA: Insufficient documentation

## 2019-09-04 DIAGNOSIS — J45909 Unspecified asthma, uncomplicated: Secondary | ICD-10-CM | POA: Insufficient documentation

## 2019-09-04 DIAGNOSIS — Z87891 Personal history of nicotine dependence: Secondary | ICD-10-CM | POA: Diagnosis not present

## 2019-09-04 DIAGNOSIS — Z95810 Presence of automatic (implantable) cardiac defibrillator: Secondary | ICD-10-CM | POA: Insufficient documentation

## 2019-09-04 DIAGNOSIS — Z833 Family history of diabetes mellitus: Secondary | ICD-10-CM | POA: Insufficient documentation

## 2019-09-04 DIAGNOSIS — I340 Nonrheumatic mitral (valve) insufficiency: Secondary | ICD-10-CM | POA: Insufficient documentation

## 2019-09-04 DIAGNOSIS — Z8249 Family history of ischemic heart disease and other diseases of the circulatory system: Secondary | ICD-10-CM | POA: Diagnosis not present

## 2019-09-04 DIAGNOSIS — I5022 Chronic systolic (congestive) heart failure: Secondary | ICD-10-CM | POA: Diagnosis not present

## 2019-09-04 NOTE — Discharge Instructions (Signed)
If your Covid-19 test is positive, you will receive a phone call from [place] regarding your results. Negative test results are not called. Both positive and negative results area always visible on MyChart. If you do not have a MyChart account, sign up instructions are in your discharge papers.  

## 2019-09-04 NOTE — ED Triage Notes (Signed)
Pt present nasal drainage in the back of her throat. Symptoms started a two days ago. Pt denies any other symptoms

## 2019-09-04 NOTE — ED Provider Notes (Signed)
[place]   [id number] 09/04/19 Arrival Time: 1030  ASSESSMENT & PLAN:  1. Nasal congestion      COVID-19 testing sent. To quarantine until results available. See work note for instructions.  Follow-up Information    [facility].   Specialty: Urgent Care Why: As needed. Contact information: [address] [phone]          Reviewed expectations re: course of current medical issues. Questions answered. Outlined signs and symptoms indicating need for more acute intervention. Patient verbalized understanding. After Visit Summary given.   SUBJECTIVE: History from: patient. Meria Crilly is a 38 y.o. female who requests COVID-19 testing. Known COVID-19 contact: none. Recent travel: none. Denies: fever, cough, difficulty breathing and headache. Does reports post-nasal drainage. Normal PO intake without n/v/d.  ROS: As per HPI.   OBJECTIVE:  Vitals:   09/04/19 1226  BP: 102/69  Pulse: 82  Resp: 16  Temp: 97.7 F (36.5 C)  TempSrc: Oral  SpO2: 100%    General appearance: alert; no distress Eyes: PERRLA; EOMI; conjunctiva normal HENT: [place]; AT; nasal mucosa normal; oral mucosa normal; nasal congestion Neck: supple  Lungs: speaks full sentences without difficulty; unlabored Heart: regular rate and rhythm Abdomen: soft, non-tender Extremities: no edema Skin: warm and dry Neurologic: normal gait Psychological: alert and cooperative; normal mood and affect  Labs:  Labs Reviewed  NOVEL CORONAVIRUS, NAA (HOSP ORDER, SEND-OUT TO REF LAB; TAT 18-24 HRS)      Allergies  Allergen Reactions  . Metformin And Related Diarrhea    And caused severe "dry mouth," also    Past Medical History:  Diagnosis Date  . Asthma   . Chronic systolic CHF (congestive heart failure) ([place])    a. ECHO 01/15/2016 EF 10-15%. 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  . Diabetes mellitus ([place])  06/17/2019  . Hearing loss    bilateral  . Mitral regurgitation    a. severe, felt to be functional 2/2 LV dilation   . Morbid obesity ([place])   . Snoring    Social History   Socioeconomic History  . Marital status: Single    Spouse name: Not on file  . Number of children: 2  . Years of education: Not on file  . Highest education level: Not on file  Occupational History  . Not on file  Tobacco Use  . Smoking status: Former Smoker    Quit date: 10/19/2015    Years since quitting: 3.8  . Smokeless tobacco: Never Used  Substance and Sexual Activity  . Alcohol use: Yes    Alcohol/week: 0.0 standard drinks    Comment: occasionally  . Drug use: Yes    Types: Marijuana    Comment: only socially  . Sexual activity: Yes  Other Topics Concern  . Not on file  Social History Narrative  . Not on file   Social Determinants of Health   Financial Resource Strain:   . Difficulty of Paying Living Expenses: Not on file  Food Insecurity:   . Worried About Charity fundraiser in the Last Year: Not on file  . Ran Out of Food in the Last Year: Not on file  Transportation Needs:   . Lack of Transportation (Medical): Not on file  . Lack of Transportation (Non-Medical): Not on file  Physical Activity:   . Days of Exercise per Week: Not on file  . Minutes of Exercise per Session: Not on  file  Stress:   . Feeling of Stress : Not on file  Social Connections:   . Frequency of Communication with Friends and Family: Not on file  . Frequency of Social Gatherings with Friends and Family: Not on file  . Attends Religious Services: Not on file  . Active Member of Clubs or Organizations: Not on file  . Attends Banker Meetings: Not on file  . Marital Status: Not on file  Intimate Partner Violence:   . Fear of Current or Ex-Partner: Not on file  . Emotionally Abused: Not on file  . Physically Abused: Not on file  . Sexually Abused: Not on file   Family History  Problem Relation Age  of Onset  . Heart attack Mother   . Hyperlipidemia Father   . Diabetes Brother    Past Surgical History:  Procedure Laterality Date  . CARDIAC CATHETERIZATION N/A 01/18/2016   Procedure: Right/Left Heart Cath and Coronary Angiography;  Surgeon: Laurey Morale, MD;  [facility] INVASIVE CV LAB;  Service: Cardiovascular;  Laterality: N/A;  . CESAREAN SECTION    . ICD IMPLANT N/A 04/17/2017   Procedure: ICD Implant;  Surgeon: Regan Lemming, MD;  Location: [facility] INVASIVE CV LAB;  Service: Cardiovascular;  Laterality: N/A;  . IR FLUORO GUIDE CV LINE RIGHT  06/23/2019  . IR US GUIDE VASC ACCESS RIGHT  06/23/2019  . LEAD REVISION/REPAIR N/A 04/21/2017   Procedure: Lead Revision/Repair;  Surgeon: Regan Lemming, MD;  [facility] INVASIVE CV LAB;  Service: Cardiovascular;  Laterality: N/A;  . RIGHT HEART CATH N/A 06/21/2019   Procedure: RIGHT HEART CATH;  Surgeon: Laurey Morale, MD;  Location: [facility] INVASIVE CV LAB;  Service: Cardiovascular;  Laterality: N/A;  . Greig Right, MD 09/04/19 1238

## 2019-09-05 DIAGNOSIS — O903 Peripartum cardiomyopathy: Secondary | ICD-10-CM | POA: Diagnosis not present

## 2019-09-05 DIAGNOSIS — J45909 Unspecified asthma, uncomplicated: Secondary | ICD-10-CM | POA: Diagnosis not present

## 2019-09-05 DIAGNOSIS — Z452 Encounter for adjustment and management of vascular access device: Secondary | ICD-10-CM | POA: Diagnosis not present

## 2019-09-05 DIAGNOSIS — Z6841 Body Mass Index (BMI) 40.0 and over, adult: Secondary | ICD-10-CM | POA: Diagnosis not present

## 2019-09-05 DIAGNOSIS — E119 Type 2 diabetes mellitus without complications: Secondary | ICD-10-CM | POA: Diagnosis not present

## 2019-09-05 DIAGNOSIS — Z5181 Encounter for therapeutic drug level monitoring: Secondary | ICD-10-CM | POA: Diagnosis not present

## 2019-09-05 DIAGNOSIS — Z79899 Other long term (current) drug therapy: Secondary | ICD-10-CM | POA: Diagnosis not present

## 2019-09-05 DIAGNOSIS — F17210 Nicotine dependence, cigarettes, uncomplicated: Secondary | ICD-10-CM | POA: Diagnosis not present

## 2019-09-05 DIAGNOSIS — I340 Nonrheumatic mitral (valve) insufficiency: Secondary | ICD-10-CM | POA: Diagnosis not present

## 2019-09-05 DIAGNOSIS — I5023 Acute on chronic systolic (congestive) heart failure: Secondary | ICD-10-CM | POA: Diagnosis not present

## 2019-09-05 DIAGNOSIS — Z7984 Long term (current) use of oral hypoglycemic drugs: Secondary | ICD-10-CM | POA: Diagnosis not present

## 2019-09-05 LAB — NOVEL CORONAVIRUS, NAA (HOSP ORDER, SEND-OUT TO REF LAB; TAT 18-24 HRS): SARS-CoV-2, NAA: NOT DETECTED

## 2019-09-07 ENCOUNTER — Other Ambulatory Visit: Payer: Self-pay

## 2019-09-07 ENCOUNTER — Telehealth (HOSPITAL_COMMUNITY): Payer: Self-pay | Admitting: *Deleted

## 2019-09-07 ENCOUNTER — Encounter (HOSPITAL_COMMUNITY): Payer: Self-pay | Admitting: Emergency Medicine

## 2019-09-07 ENCOUNTER — Emergency Department (HOSPITAL_COMMUNITY)
Admission: EM | Admit: 2019-09-07 | Discharge: 2019-09-07 | Discharge status: Against Medical Advice | Payer: Medicaid Other | Attending: Emergency Medicine | Admitting: Emergency Medicine

## 2019-09-07 DIAGNOSIS — Z5321 Procedure and treatment not carried out due to patient leaving prior to being seen by health care provider: Secondary | ICD-10-CM | POA: Insufficient documentation

## 2019-09-07 DIAGNOSIS — Z0489 Encounter for examination and observation for other specified reasons: Secondary | ICD-10-CM | POA: Diagnosis present

## 2019-09-07 NOTE — ED Triage Notes (Signed)
Patient requesting Milrinone IV bag replacement , IV line going to the pump is broken this evening .

## 2019-09-07 NOTE — Telephone Encounter (Signed)
Last [employer] goal:  - Look into joining [employer] near her house  - Continue walking stairs 2x a day, walking/dancing 1x a day for 30-45 mins o Walk 1.5 miles in 30 minutes o Dancing 30 minutes  - Track workouts and email to me- Cyril Mourning   Progress Note:  - Yesterday's weight 351 lbs---  (Starting weight 363) - Emailed exercise stats (MapMyWalk apps)  o Wednesday 12/16   30:10 mins 468 calories (Dance) o Monday 12/21    1 mile, 32 mins, 244 calories (Walk) - She accidently cut her milrinone line and has been very tired and short of breath - Since then she has been scared and has not slept well  - She bought a month's supply of protein shakes (premier) from [employer]  o Chocolate, vanilla, strawberries and cream, blueberry oats, banana cream  - Has been only replacing one meal a day with the shake and using it for snacks  - She lost her friend recently and uses that in her mind as a motivator and driver - She and her family have been congested and got tested for COVID-19 (tested negative) - TODAY only 15-20 mins of exercise due to milrinone and supply adjustments  - Continuing virtual therapy visits and hopes to continue this to help with grieving    Achievements: - She feels good and confident  - She says she is excited about her "double chin" disappearing  - She has been really enjoying the shakes in the morning for breakfast and to take her medicine  - She continued to walk even though she was a little more winded than normal  - She enjoyed her walk alone to have her thoughts  - She did not worry about what people think   Challenges/Frustrations: - Recently lost her friend on the 20th of the month and is grieving over this loss  - Feeling congested and with drainage and this is affecting her exercise.  - Stephanie's family is arriving for her birthday but will only be there for the day.    Next [employer] goals:  - More specific journaling   o 3 challenges o 3 celebrations   o 3 gratitude  - Walk 1.5 miles in 30 minutes - Dance 30 minutes on days you do not walk  - Walk stairs 2x each 2x a day (morning and afternoon)  - Daily emails to [place] of exercise and journaling   Next Session:   Wednesday 12/30 in the am hours (via Telephone Encounter)   Time Spent with patient: 30 minutes      Landis Martins, MS, ACSM-RCEP Clinical Exercise Physiologist/ Health and [place]

## 2019-09-07 NOTE — ED Notes (Signed)
Pt has been expressing desire to leave. Asked to stay and see provider. Pt says she can get an emergency bag of medicine to replace her's. Pt refuses to stay. Pt seen leaving lobby to parking lot with family.

## 2019-09-08 ENCOUNTER — Other Ambulatory Visit: Payer: Self-pay

## 2019-09-09 DIAGNOSIS — Z7984 Long term (current) use of oral hypoglycemic drugs: Secondary | ICD-10-CM | POA: Diagnosis not present

## 2019-09-09 DIAGNOSIS — I340 Nonrheumatic mitral (valve) insufficiency: Secondary | ICD-10-CM | POA: Diagnosis not present

## 2019-09-09 DIAGNOSIS — O903 Peripartum cardiomyopathy: Secondary | ICD-10-CM | POA: Diagnosis not present

## 2019-09-09 DIAGNOSIS — E119 Type 2 diabetes mellitus without complications: Secondary | ICD-10-CM | POA: Diagnosis not present

## 2019-09-09 DIAGNOSIS — Z6841 Body Mass Index (BMI) 40.0 and over, adult: Secondary | ICD-10-CM | POA: Diagnosis not present

## 2019-09-09 DIAGNOSIS — F17210 Nicotine dependence, cigarettes, uncomplicated: Secondary | ICD-10-CM | POA: Diagnosis not present

## 2019-09-09 DIAGNOSIS — I5023 Acute on chronic systolic (congestive) heart failure: Secondary | ICD-10-CM | POA: Diagnosis not present

## 2019-09-09 DIAGNOSIS — J45909 Unspecified asthma, uncomplicated: Secondary | ICD-10-CM | POA: Diagnosis not present

## 2019-09-09 DIAGNOSIS — Z452 Encounter for adjustment and management of vascular access device: Secondary | ICD-10-CM | POA: Diagnosis not present

## 2019-09-09 DIAGNOSIS — Z79899 Other long term (current) drug therapy: Secondary | ICD-10-CM | POA: Diagnosis not present

## 2019-09-09 DIAGNOSIS — Z5181 Encounter for therapeutic drug level monitoring: Secondary | ICD-10-CM | POA: Diagnosis not present

## 2019-09-14 DIAGNOSIS — I110 Hypertensive heart disease with heart failure: Secondary | ICD-10-CM | POA: Diagnosis not present

## 2019-09-14 DIAGNOSIS — Z9989 Dependence on other enabling machines and devices: Secondary | ICD-10-CM | POA: Diagnosis not present

## 2019-09-14 DIAGNOSIS — I5022 Chronic systolic (congestive) heart failure: Secondary | ICD-10-CM | POA: Diagnosis not present

## 2019-09-14 DIAGNOSIS — E119 Type 2 diabetes mellitus without complications: Secondary | ICD-10-CM | POA: Diagnosis not present

## 2019-09-14 DIAGNOSIS — Z6841 Body Mass Index (BMI) 40.0 and over, adult: Secondary | ICD-10-CM | POA: Diagnosis not present

## 2019-09-14 DIAGNOSIS — Z7984 Long term (current) use of oral hypoglycemic drugs: Secondary | ICD-10-CM | POA: Diagnosis not present

## 2019-09-14 DIAGNOSIS — G4733 Obstructive sleep apnea (adult) (pediatric): Secondary | ICD-10-CM | POA: Diagnosis not present

## 2019-09-15 DIAGNOSIS — Z79899 Other long term (current) drug therapy: Secondary | ICD-10-CM | POA: Diagnosis not present

## 2019-09-15 DIAGNOSIS — I5023 Acute on chronic systolic (congestive) heart failure: Secondary | ICD-10-CM | POA: Diagnosis not present

## 2019-09-15 DIAGNOSIS — Z5181 Encounter for therapeutic drug level monitoring: Secondary | ICD-10-CM | POA: Diagnosis not present

## 2019-09-15 DIAGNOSIS — F17210 Nicotine dependence, cigarettes, uncomplicated: Secondary | ICD-10-CM | POA: Diagnosis not present

## 2019-09-15 DIAGNOSIS — I340 Nonrheumatic mitral (valve) insufficiency: Secondary | ICD-10-CM | POA: Diagnosis not present

## 2019-09-15 DIAGNOSIS — O903 Peripartum cardiomyopathy: Secondary | ICD-10-CM | POA: Diagnosis not present

## 2019-09-15 DIAGNOSIS — Z7984 Long term (current) use of oral hypoglycemic drugs: Secondary | ICD-10-CM | POA: Diagnosis not present

## 2019-09-15 DIAGNOSIS — J45909 Unspecified asthma, uncomplicated: Secondary | ICD-10-CM | POA: Diagnosis not present

## 2019-09-15 DIAGNOSIS — E119 Type 2 diabetes mellitus without complications: Secondary | ICD-10-CM | POA: Diagnosis not present

## 2019-09-15 DIAGNOSIS — Z6841 Body Mass Index (BMI) 40.0 and over, adult: Secondary | ICD-10-CM | POA: Diagnosis not present

## 2019-09-15 DIAGNOSIS — Z452 Encounter for adjustment and management of vascular access device: Secondary | ICD-10-CM | POA: Diagnosis not present

## 2019-09-16 ENCOUNTER — Other Ambulatory Visit (HOSPITAL_COMMUNITY): Payer: Self-pay | Admitting: Cardiology

## 2019-09-16 ENCOUNTER — Other Ambulatory Visit (HOSPITAL_COMMUNITY): Payer: Self-pay

## 2019-09-16 ENCOUNTER — Telehealth (HOSPITAL_COMMUNITY): Payer: Self-pay | Admitting: *Deleted

## 2019-09-16 MED ORDER — METOLAZONE 2.5 MG PO TABS: ORAL_TABLET | ORAL | 3 refills | Status: DC

## 2019-09-16 NOTE — Progress Notes (Signed)
Paramedicine Encounter    [name], female    DOB: 1981-04-18, 38 y.o.   MRN: [id number]   Patient Care Team: System, Provider Not In as PCP - General Regan Lemming, MD as PCP - Electrophysiology (Cardiology) Laurey Morale, MD as PCP - Advanced Heart Failure (Cardiology)  Patient Active Problem List   Diagnosis Date Noted  . Acute on chronic systolic CHF (congestive heart failure) (HCC) 06/17/2019  . Obstructive sleep apnea 06/17/2019  . 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 Diabetes mellitus (HCC) 06/17/2019  . Amenorrhea 08/03/2018  . Congestive heart failure (HCC) 06/29/2018  . Hypotension 06/07/2018  . Rhinovirus infection 06/06/2018  . Asthma 06/05/2018  . Acute respiratory failure with hypoxia (HCC) 06/05/2018  . Hypokalemia 06/05/2018  . Snoring 09/23/2016  . Cough 04/10/2016  . 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 Tobacco abuse 01/25/2016  . Acute on chronic combined systolic and diastolic CHF (congestive heart failure) (HCC) 01/25/2016  . Mitral regurgitation   . Abnormal EKG-inf TWI 01/15/2016  . Positive D dimer-CTA neg for PE 01/15/2016  . Morbid obesity- BMI 57 01/14/2016  . Abdominal pain 01/14/2016  . 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 At risk for sleep apnea 01/14/2016  . Cardiomyopathy (HCC) 11/01/2015  . Morbid obesity with BMI of 50.0-59.9, adult (HCC) 08/16/2015    Current Outpatient Medications:  .  albuterol (PROAIR HFA) 108 (90 Base) MCG/ACT inhaler, Inhale 2 puffs into the lungs every 6 (six) hours as needed for wheezing or shortness of breath., Disp: , Rfl:  .  atorvastatin (LIPITOR) 10 MG tablet, Take 1 tablet (10 mg total) by mouth daily., Disp: 90 tablet, Rfl: 3 .  carvedilol (COREG) 3.125 MG tablet, Take 1 tablet (3.125 mg total) by mouth 2 (two) times daily with a meal., Disp: 60 tablet, Rfl: 5 .  digoxin (LANOXIN) 0.125 MG tablet, Take 1 tablet (0.125 mg total) by  mouth daily., Disp: 90 tablet, Rfl: 3 .  glipiZIDE (GLUCOTROL) 5 MG tablet, Take 1 tablet (5 mg total) by mouth daily., Disp: 90 tablet, Rfl: 0 .  ipratropium-albuterol (DUONEB) 0.5-2.5 (3) MG/3ML SOLN, Take 3 mLs by nebulization every 4 (four) hours as needed., Disp: 360 mL, Rfl: 0 .  ivabradine (CORLANOR) 7.5 MG TABS tablet, Take 1 tablet (7.5 mg total) by mouth 2 (two) times daily with a meal., Disp: 60 tablet, Rfl: 3 .  Magnesium Oxide 400 (240 Mg) MG TABS, Take 1 tablet (400 mg total) by mouth 2 (two) times daily., Disp: 60 tablet, Rfl: 6 .  milrinone (PRIMACOR) 20 MG/100 ML SOLN infusion, Inject 0.0414 mg/min into the vein continuous., Disp:  , Rfl:  .  potassium chloride SA (KLOR-CON) 20 MEQ tablet, Take 3 tablets (60 mEq total) by mouth 3 (three) times daily. Take an additional with metolazone., Disp: 280 tablet, Rfl: 3 .  PRESCRIPTION MEDICATION, CPAP- AT BEDTIME, Disp: , Rfl:  .  sacubitril-valsartan (ENTRESTO) 24-26 MG, Take 1 tablet by mouth 2 (two) times daily., Disp: 60 tablet, Rfl: 5 .  spironolactone (ALDACTONE) 25 MG tablet, Take 1 tablet (25 mg total) by mouth daily., Disp: 90 tablet, Rfl: 3 .  torsemide (DEMADEX) 20 MG tablet, Take 4 tablets (80 mg total) by mouth 2 (two) times daily., Disp: 720 tablet, Rfl: 3 .  metolazone (ZAROXOLYN) 2.5 MG tablet, TAKE 1 TABLET(2.5 MG) BY MOUTH 1 TIME A WEEK, Disp: 10 tablet, Rfl: 3 Allergies  Allergen Reactions  . Metformin And Related Diarrhea  And caused severe "dry mouth," also      Social History   Socioeconomic History  . Marital status: Single    Spouse name: Not on file  . Number of children: 2  . Years of education: Not on file  . Highest education level: Not on file  Occupational History  . Not on file  Tobacco Use  . Smoking status: Former Smoker    Quit date: 10/19/2015    Years since quitting: 3.9  . Smokeless tobacco: Never Used  Substance and Sexual Activity  . Alcohol use: Yes    Alcohol/week: 0.0  standard drinks    Comment: occasionally  . Drug use: Yes    Types: Marijuana    Comment: only socially  . Sexual activity: Yes  Other Topics Concern  . Not on file  Social History Narrative  . Not on file   Social Determinants of Health   Financial Resource Strain:   . Difficulty of Paying Living Expenses: Not on file  Food Insecurity:   . Worried About Programme researcher, broadcasting/film/video in the Last Year: Not on file  . Ran Out of Food in the Last Year: Not on file  Transportation Needs:   . Lack of Transportation (Medical): Not on file  . Lack of Transportation (Non-Medical): Not on file  Physical Activity:   . Days of Exercise per Week: Not on file  . Minutes of Exercise per Session: Not on file  Stress:   . Feeling of Stress : Not on file  Social Connections:   . Frequency of Communication with Friends and Family: Not on file  . Frequency of Social Gatherings with Friends and Family: Not on file  . Attends Religious Services: Not on file  . Active Member of Clubs or Organizations: Not on file  . Attends Banker Meetings: Not on file  . Marital Status: Not on file  Intimate Partner Violence:   . Fear of Current or Ex-Partner: Not on file  . Emotionally Abused: Not on file  . Physically Abused: Not on file  . Sexually Abused: Not on file    Physical Exam      Future Appointments  Date Time Provider Department Center  09/21/2019 10:30 AM [facility] PA/NP [facility] None  10/25/2019  7:45 AM [facility] DEVICE REMOTES [facility]  11/17/2019 10:20 AM Laurey Morale, MD [facility] None  01/24/2020  7:45 AM [facility] DEVICE REMOTES [facility]  04/24/2020  7:45 AM [facility] DEVICE REMOTES [facility]  07/24/2020  7:45 AM [facility] DEVICE REMOTES [facility]    BP (!) 100/0   Pulse 78   Temp (!) 97.3 F (36.3 C)   Resp 18   LMP 08/19/2019   SpO2 98%   Weight yesterday-352 Last visit weight-352  Pt reports she is  doing alright. She said she hadnt felt good the past few days but feels better today.  She reports cold symptoms with sinus pressure. But she does feel better.  She has been using breathing treatments as well.  She states her breathing is doing better.  She needed refill on metolazone-it has 0 refills so I called clinic and asked chantel to send it over-its quicker when clinic sends it over and with the holiday tomor to get it sooner. chantel called back and said it was sent on the 17th-it was not ready yet so the metolazone, mag and bydureon we called in. Pharmacy said to give them an hour and it would all be ready.  She reports she is able to get up there to get her meds.  meds reviewed and pill box refilled.  She did take her mornings meds today.  She is out of her metolazone and has not taken it yet this week.  Mag is out after Monday.  Will f/u next week.   [name], [facility] Paramedic  09/16/19

## 2019-09-16 NOTE — Telephone Encounter (Signed)
Contacted [phone] per HF-CSW. Attempted to contact patient for weekly follow-up regarding exercise and wellness. No Answer and no voicemail available.  Will attempt to reach patient again next week.    Landis Martins, MS, ACSM-RCEP Clinical Exercise Physiologist

## 2019-09-21 ENCOUNTER — Encounter (HOSPITAL_COMMUNITY): Payer: Medicaid Other

## 2019-09-22 ENCOUNTER — Telehealth (HOSPITAL_COMMUNITY): Payer: Self-pay

## 2019-09-22 ENCOUNTER — Other Ambulatory Visit (HOSPITAL_COMMUNITY): Payer: Self-pay

## 2019-09-22 ENCOUNTER — Telehealth: Payer: Self-pay | Admitting: Cardiology

## 2019-09-22 DIAGNOSIS — Z5181 Encounter for therapeutic drug level monitoring: Secondary | ICD-10-CM | POA: Diagnosis not present

## 2019-09-22 DIAGNOSIS — Z7984 Long term (current) use of oral hypoglycemic drugs: Secondary | ICD-10-CM | POA: Diagnosis not present

## 2019-09-22 DIAGNOSIS — Z79899 Other long term (current) drug therapy: Secondary | ICD-10-CM | POA: Diagnosis not present

## 2019-09-22 DIAGNOSIS — E119 Type 2 diabetes mellitus without complications: Secondary | ICD-10-CM | POA: Diagnosis not present

## 2019-09-22 DIAGNOSIS — I5023 Acute on chronic systolic (congestive) heart failure: Secondary | ICD-10-CM | POA: Diagnosis not present

## 2019-09-22 DIAGNOSIS — F17210 Nicotine dependence, cigarettes, uncomplicated: Secondary | ICD-10-CM | POA: Diagnosis not present

## 2019-09-22 DIAGNOSIS — Z452 Encounter for adjustment and management of vascular access device: Secondary | ICD-10-CM | POA: Diagnosis not present

## 2019-09-22 DIAGNOSIS — O903 Peripartum cardiomyopathy: Secondary | ICD-10-CM | POA: Diagnosis not present

## 2019-09-22 DIAGNOSIS — I340 Nonrheumatic mitral (valve) insufficiency: Secondary | ICD-10-CM | POA: Diagnosis not present

## 2019-09-22 DIAGNOSIS — Z6841 Body Mass Index (BMI) 40.0 and over, adult: Secondary | ICD-10-CM | POA: Diagnosis not present

## 2019-09-22 DIAGNOSIS — J45909 Unspecified asthma, uncomplicated: Secondary | ICD-10-CM | POA: Diagnosis not present

## 2019-09-22 NOTE — Telephone Encounter (Signed)
kristy home health nurse with [facility] called to report that the patient changed her milrinone bag again without the nurse being there. This is is the 2nd time that the patient has done this without the nurse being present.   Kirsten Wheeler just wanted to make [place] aware.

## 2019-09-22 NOTE — Progress Notes (Signed)
Paramedicine Encounter    [name], female    DOB: 11-29-1980, 39 y.o.   MRN: [id number]   Patient Care Team: System, Provider Not In as PCP - General Regan Lemming, MD as PCP - Electrophysiology (Cardiology) Laurey Morale, MD as PCP - Advanced Heart Failure (Cardiology)  Patient Active Problem List   Diagnosis Date Noted  . Acute on chronic systolic CHF (congestive heart failure) (HCC) 06/17/2019  . Obstructive sleep apnea 06/17/2019  . 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 Diabetes mellitus (HCC) 06/17/2019  . Amenorrhea 08/03/2018  . Congestive heart failure (HCC) 06/29/2018  . Hypotension 06/07/2018  . Rhinovirus infection 06/06/2018  . Asthma 06/05/2018  . Acute respiratory failure with hypoxia (HCC) 06/05/2018  . Hypokalemia 06/05/2018  . Snoring 09/23/2016  . Cough 04/10/2016  . 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 Tobacco abuse 01/25/2016  . Acute on chronic combined systolic and diastolic CHF (congestive heart failure) (HCC) 01/25/2016  . Mitral regurgitation   . Abnormal EKG-inf TWI 01/15/2016  . Positive D dimer-CTA neg for PE 01/15/2016  . Morbid obesity- BMI 57 01/14/2016  . Abdominal pain 01/14/2016  . 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 At risk for sleep apnea 01/14/2016  . Cardiomyopathy (HCC) 11/01/2015  . Morbid obesity with BMI of 50.0-59.9, adult (HCC) 08/16/2015    Current Outpatient Medications:  .  albuterol (PROAIR HFA) 108 (90 Base) MCG/ACT inhaler, Inhale 2 puffs into the lungs every 6 (six) hours as needed for wheezing or shortness of breath., Disp: , Rfl:  .  atorvastatin (LIPITOR) 10 MG tablet, Take 1 tablet (10 mg total) by mouth daily., Disp: 90 tablet, Rfl: 3 .  carvedilol (COREG) 3.125 MG tablet, Take 1 tablet (3.125 mg total) by mouth 2 (two) times daily with a meal., Disp: 60 tablet, Rfl: 5 .  digoxin (LANOXIN) 0.125 MG tablet, Take 1 tablet (0.125 mg total) by  mouth daily., Disp: 90 tablet, Rfl: 3 .  glipiZIDE (GLUCOTROL) 5 MG tablet, Take 1 tablet (5 mg total) by mouth daily., Disp: 90 tablet, Rfl: 0 .  ipratropium-albuterol (DUONEB) 0.5-2.5 (3) MG/3ML SOLN, Take 3 mLs by nebulization every 4 (four) hours as needed., Disp: 360 mL, Rfl: 0 .  ivabradine (CORLANOR) 7.5 MG TABS tablet, Take 1 tablet (7.5 mg total) by mouth 2 (two) times daily with a meal., Disp: 60 tablet, Rfl: 3 .  Magnesium Oxide 400 (240 Mg) MG TABS, Take 1 tablet (400 mg total) by mouth 2 (two) times daily., Disp: 60 tablet, Rfl: 6 .  metolazone (ZAROXOLYN) 2.5 MG tablet, TAKE 1 TABLET(2.5 MG) BY MOUTH 1 TIME A WEEK, Disp: 10 tablet, Rfl: 3 .  milrinone (PRIMACOR) 20 MG/100 ML SOLN infusion, Inject 0.0414 mg/min into the vein continuous., Disp:  , Rfl:  .  potassium chloride SA (KLOR-CON) 20 MEQ tablet, Take 3 tablets (60 mEq total) by mouth 3 (three) times daily. Take an additional with metolazone., Disp: 280 tablet, Rfl: 3 .  PRESCRIPTION MEDICATION, CPAP- AT BEDTIME, Disp: , Rfl:  .  sacubitril-valsartan (ENTRESTO) 24-26 MG, Take 1 tablet by mouth 2 (two) times daily., Disp: 60 tablet, Rfl: 5 .  spironolactone (ALDACTONE) 25 MG tablet, Take 1 tablet (25 mg total) by mouth daily., Disp: 90 tablet, Rfl: 3 .  torsemide (DEMADEX) 20 MG tablet, Take 4 tablets (80 mg total) by mouth 2 (two) times daily., Disp: 720 tablet, Rfl: 3 Allergies  Allergen Reactions  . Metformin And Related Diarrhea  And caused severe "dry mouth," also      Social History   Socioeconomic History  . Marital status: Single    Spouse name: Not on file  . Number of children: 2  . Years of education: Not on file  . Highest education level: Not on file  Occupational History  . Not on file  Tobacco Use  . Smoking status: Former Smoker    Quit date: 10/19/2015    Years since quitting: 3.9  . Smokeless tobacco: Never Used  Substance and Sexual Activity  . Alcohol use: Yes    Alcohol/week: 0.0  standard drinks    Comment: occasionally  . Drug use: Yes    Types: Marijuana    Comment: only socially  . Sexual activity: Yes  Other Topics Concern  . Not on file  Social History Narrative  . Not on file   Social Determinants of Health   Financial Resource Strain:   . Difficulty of Paying Living Expenses: Not on file  Food Insecurity:   . Worried About Charity fundraiser in the Last Year: Not on file  . Ran Out of Food in the Last Year: Not on file  Transportation Needs:   . Lack of Transportation (Medical): Not on file  . Lack of Transportation (Non-Medical): Not on file  Physical Activity:   . Days of Exercise per Week: Not on file  . Minutes of Exercise per Session: Not on file  Stress:   . Feeling of Stress : Not on file  Social Connections:   . Frequency of Communication with Friends and Family: Not on file  . Frequency of Social Gatherings with Friends and Family: Not on file  . Attends Religious Services: Not on file  . Active Member of Clubs or Organizations: Not on file  . Attends Archivist Meetings: Not on file  . Marital Status: Not on file  Intimate Partner Violence:   . Fear of Current or Ex-Partner: Not on file  . Emotionally Abused: Not on file  . Physically Abused: Not on file  . Sexually Abused: Not on file    Physical Exam      Future Appointments  Date Time Provider [place]  09/27/2019  9:00 AM [facility] PA/NP [facility] None  10/25/2019  7:45 AM [facility] DEVICE REMOTES [facility]  11/17/2019 10:20 AM Larey Dresser, MD [facility] None  01/24/2020  7:45 AM [facility] DEVICE REMOTES [facility]  04/24/2020  7:45 AM [facility] DEVICE REMOTES [facility]  07/24/2020  7:45 AM [facility] DEVICE REMOTES [facility]    BP (!) 102/0   Pulse 80   Temp (!) 97.1 F (36.2 C)   Resp 18   Wt (!) 353 lb (160.1 kg)   SpO2 98%   BMI 58.74 kg/m   Weight yesterday-353 Last visit  weight-352  Pts home health nurse was here when I arrived. She had changed her dressing and pt had swapped out her IV bag of med and nurse also obtained blood work. Nurse said she would be back out next Wednesday.  Pt reports that she is doing good. Her gf and gf's kids are now gone and back home.  She reports she has been doing a broth diet--in the morning and at lunch then a meal in the evening.  However the broth is not low sodium so I advised her to choose that one instead.  She is going to be getting the premier shakes and using that once she gets  enough money.  The meal she does at supper is air fried chicken and sometimes she eats a salad.  She has been doing this for the past few days.  Forgot to take her metolazone this morning. I advised she could still take it today.  She reports after she takes her metolazone her weight is 345 the next day but then by the end of week she notices her weight creeping up and by the time she is due for her next dose of metolazone her weight is back to 352.  She cant really remember if there was something that triggered her weight to increase.  She reports her CBG's have been in the 110-120s.  meds verified and she asked I fill her pill box for her so that was done.  Ordered her corlanor for refill and she needs the new rx of her mag for the BID.   Kerry Hough, EMT-Paramedic [phone] [facility] Paramedic  09/22/19

## 2019-09-23 ENCOUNTER — Telehealth (HOSPITAL_COMMUNITY): Payer: Self-pay | Admitting: Licensed Clinical Social Worker

## 2019-09-23 ENCOUNTER — Other Ambulatory Visit (HOSPITAL_COMMUNITY): Payer: Self-pay | Admitting: Cardiology

## 2019-09-23 NOTE — Telephone Encounter (Signed)
CSW called pt to check in.  Pt reports doing well- her guest who were in town for the holiday are finally gone and so the household is much less stressful at this time.  She started a mainly liquid diet 4 days ago and states this is going well though it has been hard and she has become more emotional while on it- still remains motivated to continue as she understands the importance of her losing weight.  Pt reports that she has continued to walk daily and now has a fitness tracker from the clinics [employer] this year so has been able to see what she is doing throughout the day- will plan to send screenshot to CSW to share with exercise physiologist who continues to follow and support her in her exercise efforts.  Pt has no further needs at this time- will be in next week for a follow up appt with [facility] clinic so will see her weightloss progress at that time.  CSW will continue to follow and assist as needed  Burna Sis, LCSW Clinical Social Worker [facility] Desk#: [phone] Cell#: [phone]

## 2019-09-24 ENCOUNTER — Telehealth (HOSPITAL_COMMUNITY): Payer: Self-pay | Admitting: *Deleted

## 2019-09-24 NOTE — Telephone Encounter (Addendum)
   In Review:  - Today's weight: 351lbs (she forgot her metolazone this week) - She is on her 4th-5th day of her protein shakes for breakfast (8am and 12pm and 3pm) - encouraged by her weight loss center  - Only exercising 3/7 days--- I expressed need to improve this to no less than 5days/wk - RCEP/EWC discussed the need to focus on a week at a time and how to make a change and a difference in a week- focus on a weekly re-evaluation to avoid becoming overwhelmed or discouraged.   Celebrations: - She LOVES her new Fitness tracker watch- and will continue tracking  - She is enjoying the shakes and uses multiple ways to bring variety (buys them from [employer], [employer] and [employer])  Challenges: - She has been hungry while using the shakes and says this is making her emotional  - She feels like she messed up this week  o She is resetting since moving her house guests  o She is re-shifting her relationships and priorities in her household o She felt so busy and forgot metolazone which threw her fluid off   Weekly goal:  - Look only at the week ahead---  - Make an exercise plan for M, Tues, Wednesday, Friday (email to [place] Fri/Sat, before Sunday)  - Pick time for walking with staff on [facility] on Thursday 1/14 (email to [place]) - Walk 5 days a week in the next week (weather permitted alternate with dancing)   Next meeting: Wednesday 1/13 by Telephone or Thursday 1/14 for the walking session     Lesia Hausen, MS, ACSM-RCEP Clinical Exercise Physiologist

## 2019-09-26 NOTE — Progress Notes (Signed)
Advanced Heart Failure Clinic Note   Referring Physician: PCP: System, Provider Not In PCP-Cardiologist: Dr. 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   She presented to the ER on 06/17/19 with several days of worsening HF with SOB at rest, marked edema and orthopnea/PND, not responding to increased doses of oral diuretics. She admitted to high levels of water and Gatorade intake.   Her admit wt was 380 lb (Dry wt ~360 lb). She was started on IV Lasix but had initial poor urinary response. PO metolazone added as adjunct. Repeat echo was obtained and showed EF <20%, relatively normal RV, moderate MR. RHC 10/5 with low output (CI 1.8) and elevated PCWP but near normal RA pressure. She was started on inotropic therapy w/ milrinone for low output, 0.25 mcg/kg/min. Also treated w/ Entresto, Coreg, spironolactone, digoxin and ivabradine. She was later transitioned off of IV Lasix to po torsemide 80 mg qam/60 mg qpm.   Unfortunately, she was not a transplant candidate due to her morbid obesity. We discussed her at [facility] and Dr. Donata Clay has seen. She will need significant weight loss to get to LVAD. Will use home milrinone to try to facilitate increased activity to work towards weight loss. She understands that milrinone is not a good end-point and that we would like to use it as a bridge to eventual LVAD. RV function seems adequate for LVAD. Pt is motivated to lose weight. Tunnel cath was placed for home milrinone and home health was arranged. She was discharged home on 10/9 on milrinone 0.25 mcg.   06/27/19 she presented to the [facility] w/ CC of palpitations. Was brought in by EMS and was reportedly in SVT with HR in the 180s. She was given adenosine and converted to NSR prior to arrival to the ED. In the ED she was stable and symptoms had resolved. Labs showed normal CBC. BMP  showed hypokalemia (3.0), hyperglycemia (243), mildly elevated BUN/SCr, and low Mag, 1.5. She was given K and Mg supplementation and discharged home from the ED.  Today she returns for HF follow up.Overall feeling fine. Denies SOB/PND/Orthopnea. Appetite ok. No fever or chills. Weight at home has been trending down 345-->334 pounds. Walking 1 mile 3 times a week. Taking all medications. Remains on milrinone 0.25 mcg. Followed by [facility] for milrinone and [facility]. Continue HF Paramedicine.   Medtronic  Activity 1 hour  No VT/A fib  Impedance up Fluid index flat   RHC Procedural Findings (10/5): Hemodynamics (mmHg) RA mean 7 RV 49/10 PA 65/25 mean 45 PCWP mean 27 Oxygen saturations: PA 57% AO 100% Cardiac Output (Fick) 4.65  Cardiac Index (Fick) 1.82 PVR 3.87 WU PAPI 5.7  2D Echo 06/18/19  Left ventricular ejection fraction, by visual estimation, is <20%. The left ventricle has severely decreased function. Severely increased left ventricular size. There is no left ventricular hypertrophy. Severe global hypokinesis. 2. Left ventricular diastolic Doppler parameters are indeterminate pattern of LV diastolic filling. 3. Global right ventricle has low normal systolic function.The right ventricular size is normal. No increase in right ventricular wall thickness. 4. Left atrial size was severely dilated. 5. Right atrial size was mildly dilated. 6. The mitral valve is abnormal. Moderate mitral valve regurgitation. 7. The tricuspid valve is grossly normal. Tricuspid valve regurgitation is trivial. 8. The aortic valve is tricuspid Aortic valve regurgitation was not visualized by  color flow Doppler. 9. The pulmonic valve was grossly normal. Pulmonic valve regurgitation is not visualized by color flow Doppler. 10. Mildly elevated pulmonary artery systolic pressure. 11. A pacer wire is visualized. 12. The inferior vena cava is dilated in size with >50% respiratory variability, suggesting  right atrial pressure of 8 mmHg. 13. The interatrial septum was not well visualized.+   Past Medical History:  Diagnosis Date  . Asthma   . Chronic systolic CHF (congestive heart failure) (HCC)    a. ECHO 01/15/2016 EF 10-15%. 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  . Diabetes mellitus (HCC) 06/17/2019  . Hearing loss    bilateral  . Mitral regurgitation    a. severe, felt to be functional 2/2 LV dilation   . Morbid obesity (HCC)   . Snoring     Current Outpatient Medications  Medication Sig Dispense Refill  . albuterol (PROAIR HFA) 108 (90 Base) MCG/ACT inhaler Inhale 2 puffs into the lungs every 6 (six) hours as needed for wheezing or shortness of breath.    Marland Kitchen atorvastatin (LIPITOR) 10 MG tablet Take 1 tablet (10 mg total) by mouth daily. 90 tablet 3  . carvedilol (COREG) 3.125 MG tablet Take 1 tablet (3.125 mg total) by mouth 2 (two) times daily with a meal. 60 tablet 5  . digoxin (LANOXIN) 0.125 MG tablet Take 1 tablet (0.125 mg total) by mouth daily. 90 tablet 3  . glipiZIDE (GLUCOTROL) 5 MG tablet Take 1 tablet (5 mg total) by mouth daily. 90 tablet 0  . ipratropium-albuterol (DUONEB) 0.5-2.5 (3) MG/3ML SOLN Take 3 mLs by nebulization every 4 (four) hours as needed. 360 mL 0  . ivabradine (CORLANOR) 7.5 MG TABS tablet Take 1 tablet (7.5 mg total) by mouth 2 (two) times daily with a meal. 60 tablet 3  . Magnesium Oxide 400 (240 Mg) MG TABS Take 1 tablet (400 mg total) by mouth 2 (two) times daily. 60 tablet 6  . metolazone (ZAROXOLYN) 2.5 MG tablet TAKE 1 TABLET(2.5 MG) BY MOUTH 1 TIME A WEEK 10 tablet 3  . milrinone (PRIMACOR) 20 MG/100 ML SOLN infusion Inject 0.0414 mg/min into the vein continuous.    . potassium chloride SA (KLOR-CON) 20 MEQ tablet Take 3 tablets (60 mEq total) by mouth 3 (three) times daily. Take an additional with metolazone. 280 tablet 3  . PRESCRIPTION MEDICATION CPAP- AT BEDTIME    . sacubitril-valsartan (ENTRESTO) 24-26 MG Take 1 tablet by mouth 2  (two) times daily. 60 tablet 5  . spironolactone (ALDACTONE) 25 MG tablet Take 1 tablet (25 mg total) by mouth daily. 90 tablet 3  . torsemide (DEMADEX) 20 MG tablet Take 4 tablets (80 mg total) by mouth 2 (two) times daily. 720 tablet 3   No current facility-administered medications for this encounter.    Allergies  Allergen Reactions  . Metformin And Related Diarrhea    And caused severe "dry mouth," also      Social History   Socioeconomic History  . Marital status: Single    Spouse name: Not on file  . Number of children: 2  . Years of education: Not on file  . Highest education level: Not on file  Occupational History  . Not on file  Tobacco Use  . Smoking status: Former Smoker    Quit date: 10/19/2015    Years since quitting: 3.9  . Smokeless tobacco: Never Used  Substance and Sexual Activity  . Alcohol use: Yes    Alcohol/week: 0.0  standard drinks    Comment: occasionally  . Drug use: Yes    Types: Marijuana    Comment: only socially  . Sexual activity: Yes  Other Topics Concern  . Not on file  Social History Narrative  . Not on file   Social Determinants of Health   Financial Resource Strain:   . Difficulty of Paying Living Expenses: Not on file  Food Insecurity:   . Worried About Charity fundraiser in the Last Year: Not on file  . Ran Out of Food in the Last Year: Not on file  Transportation Needs:   . Lack of Transportation (Medical): Not on file  . Lack of Transportation (Non-Medical): Not on file  Physical Activity:   . Days of Exercise per Week: Not on file  . Minutes of Exercise per Session: Not on file  Stress:   . Feeling of Stress : Not on file  Social Connections:   . Frequency of Communication with Friends and Family: Not on file  . Frequency of Social Gatherings with Friends and Family: Not on file  . Attends Religious Services: Not on file  . Active Member of Clubs or Organizations: Not on file  . Attends Archivist Meetings:  Not on file  . Marital Status: Not on file  Intimate Partner Violence:   . Fear of Current or Ex-Partner: Not on file  . Emotionally Abused: Not on file  . Physically Abused: Not on file  . Sexually Abused: Not on file      Family History  Problem Relation Age of Onset  . Heart attack Mother   . Hyperlipidemia Father   . Diabetes Brother     Vitals:   09/27/19 0918  BP: 138/64  Pulse: (!) 53  SpO2: 97%  Weight: (!) 157.5 kg (347 lb 4.8 oz)   Wt Readings from Last 3 Encounters:  09/27/19 (!) 157.5 kg (347 lb 4.8 oz)  09/22/19 (!) 160.1 kg (353 lb)  09/02/19 (!) 159.7 kg (352 lb)    PHYSICAL EXAM General:  Well appearing. No resp difficulty HEENT: normal Neck: supple. no JVD. Carotids 2+ bilat; no bruits. No lymphadenopathy or thryomegaly appreciated. Cor: PMI nondisplaced. Regular rate & rhythm. No rubs, gallops or murmurs. Rupper chest tunneled catheter.  Lungs: clear Abdomen: soft, nontender, nondistended. No hepatosplenomegaly. No bruits or masses. Good bowel sounds. Extremities: no cyanosis, clubbing, rash, edema Neuro: alert & orientedx3, cranial nerves grossly intact. moves all 4 extremities w/o difficulty. Affect pleasant   ASSESSMENT & PLAN: 1. Chronic Systolic HF w/ Low Output/ NICM: Echo 06/2019 with EF < 20%, relatively normal RV, moderate MR. NICM with Medtronic ICD. Recent acute exacerbation in the setting of dietary indiscretion w/ sodium. Massive diuresis w/ IV Lasix, ~17 lb. D/c dry wt 357 lb.  RHC 10/5 with low output (CI 1.8) and elevated PCWP but near normal RA pressure, started on milrinone 0.25 w/ plans to continue home milrinone as bridge to possible LVAD if significant weight loss.  -NYHA II. Volume status stable. Continue torsemide 80 mg twice a day.  -  Continue Coreg 3.125 mg bid.  - Continue Entresto 24/26 bid.  - Continue ivabradine to 7.5 mg twice a day.   - Continue spironolactone 25 daily.  - Continue digoxin 0.125 mg - VAD consideration:  Her size and social support will be issues.  She would not be a transplant candidate at this point due to weight. We discussed her at [facility], Dr. Prescott Gum has  seen.  She will need significant weight loss to get to LVAD.  Will use home milrinone to try to facilitate increased activity to work towards weight loss.  She understands that milrinone is not a good end-point and that we would like to use it as a bridge to eventual LVAD. RV function seems adequate for LVAD.   2. Morbid obesity:  Body mass index is 57.79 kg/m.  [facility]   3. SVT: had ED visit 10/11 for SVT noted by EMS that converted back to NSR after dose of adenosine. This was in the setting of hypokalemia and hypomagnesemia, at 3.0 and 1.5 respectively. Also in the setting of inotropic therapy w/ milrinone.  No further episodes   4. OSA Continue CPAP.   5. DMII Per PCP. 06/17/19 Hgb A1C 9.4  - Consider SGT2I with weight loss.   Instructed to increase activity and walk > 5000 steps per day with goal to walk [id number].  Follow up 4 weeks with APP and 12 weeks with Dr Shirlee Latch.  Greater than 50% of the (total minutes 25*) visit spent in counseling/coordination of care regarding the above.     Tonye Becket, NP 09/27/19

## 2019-09-27 ENCOUNTER — Ambulatory Visit (HOSPITAL_COMMUNITY)
Admission: RE | Admit: 2019-09-27 | Discharge: 2019-09-27 | Disposition: A | Discharge status: Home / Self Care | Payer: Medicaid Other | Source: Ambulatory Visit | Attending: Adult Health | Admitting: Adult Health

## 2019-09-27 ENCOUNTER — Encounter (HOSPITAL_COMMUNITY): Payer: Self-pay

## 2019-09-27 ENCOUNTER — Other Ambulatory Visit (HOSPITAL_COMMUNITY): Payer: Self-pay

## 2019-09-27 ENCOUNTER — Other Ambulatory Visit: Payer: Self-pay

## 2019-09-27 VITALS — BP 138/64 | HR 53 | Wt 347.3 lb

## 2019-09-27 DIAGNOSIS — Z87891 Personal history of nicotine dependence: Secondary | ICD-10-CM | POA: Insufficient documentation

## 2019-09-27 DIAGNOSIS — J45909 Unspecified asthma, uncomplicated: Secondary | ICD-10-CM | POA: Insufficient documentation

## 2019-09-27 DIAGNOSIS — I340 Nonrheumatic mitral (valve) insufficiency: Secondary | ICD-10-CM | POA: Insufficient documentation

## 2019-09-27 DIAGNOSIS — I5022 Chronic systolic (congestive) heart failure: Secondary | ICD-10-CM | POA: Insufficient documentation

## 2019-09-27 DIAGNOSIS — I428 Other cardiomyopathies: Secondary | ICD-10-CM | POA: Insufficient documentation

## 2019-09-27 DIAGNOSIS — G4730 Sleep apnea, unspecified: Secondary | ICD-10-CM

## 2019-09-27 DIAGNOSIS — Z6841 Body Mass Index (BMI) 40.0 and over, adult: Secondary | ICD-10-CM | POA: Diagnosis not present

## 2019-09-27 DIAGNOSIS — I471 Supraventricular tachycardia: Secondary | ICD-10-CM | POA: Insufficient documentation

## 2019-09-27 DIAGNOSIS — E876 Hypokalemia: Secondary | ICD-10-CM | POA: Diagnosis not present

## 2019-09-27 DIAGNOSIS — Z79899 Other long term (current) drug therapy: Secondary | ICD-10-CM | POA: Diagnosis not present

## 2019-09-27 DIAGNOSIS — E1165 Type 2 diabetes mellitus with hyperglycemia: Secondary | ICD-10-CM | POA: Insufficient documentation

## 2019-09-27 DIAGNOSIS — G4733 Obstructive sleep apnea (adult) (pediatric): Secondary | ICD-10-CM | POA: Insufficient documentation

## 2019-09-27 DIAGNOSIS — Z7984 Long term (current) use of oral hypoglycemic drugs: Secondary | ICD-10-CM | POA: Diagnosis not present

## 2019-09-27 DIAGNOSIS — Z833 Family history of diabetes mellitus: Secondary | ICD-10-CM | POA: Insufficient documentation

## 2019-09-27 DIAGNOSIS — Z8249 Family history of ischemic heart disease and other diseases of the circulatory system: Secondary | ICD-10-CM | POA: Insufficient documentation

## 2019-09-27 DIAGNOSIS — Z882 Allergy status to sulfonamides status: Secondary | ICD-10-CM | POA: Insufficient documentation

## 2019-09-27 NOTE — Patient Instructions (Signed)
Follow up in 4 weeks with [name].  Follow up in 3 months with Dr.McLean.

## 2019-09-27 NOTE — Progress Notes (Signed)
Paramedicine Encounter   [name] , female,   DOB: 1980/12/13,38 y.o.,  MRN: [id number]   Met patient in clinic today with provider.  Weight @ clinic-347  Weight @ clinic last time-357 Weight @ home-345 B/p-138/64 p-53 [id number]   Pt here today for f/u, she has lost weight! She denies any c/p, no dizziness. She has been snappy and emotional with the change in diet and being hungry but she is making it through. I think the holidays and the company at the house made it stressful and now that is over so I think now she can get back on track.  The majority of this weight loss has came in the past 10-12 days. She has changed from the broth diet to the premier shakes.  [name], [place] 09/27/2019

## 2019-09-29 ENCOUNTER — Telehealth (HOSPITAL_COMMUNITY): Payer: Self-pay | Admitting: *Deleted

## 2019-09-29 DIAGNOSIS — Z452 Encounter for adjustment and management of vascular access device: Secondary | ICD-10-CM | POA: Diagnosis not present

## 2019-09-29 DIAGNOSIS — O903 Peripartum cardiomyopathy: Secondary | ICD-10-CM | POA: Diagnosis not present

## 2019-09-29 DIAGNOSIS — I5023 Acute on chronic systolic (congestive) heart failure: Secondary | ICD-10-CM | POA: Diagnosis not present

## 2019-09-29 DIAGNOSIS — Z6841 Body Mass Index (BMI) 40.0 and over, adult: Secondary | ICD-10-CM | POA: Diagnosis not present

## 2019-09-29 DIAGNOSIS — Z5181 Encounter for therapeutic drug level monitoring: Secondary | ICD-10-CM | POA: Diagnosis not present

## 2019-09-29 DIAGNOSIS — Z79899 Other long term (current) drug therapy: Secondary | ICD-10-CM | POA: Diagnosis not present

## 2019-09-29 DIAGNOSIS — F17210 Nicotine dependence, cigarettes, uncomplicated: Secondary | ICD-10-CM | POA: Diagnosis not present

## 2019-09-29 DIAGNOSIS — J45909 Unspecified asthma, uncomplicated: Secondary | ICD-10-CM | POA: Diagnosis not present

## 2019-09-29 DIAGNOSIS — I340 Nonrheumatic mitral (valve) insufficiency: Secondary | ICD-10-CM | POA: Diagnosis not present

## 2019-09-29 DIAGNOSIS — Z7984 Long term (current) use of oral hypoglycemic drugs: Secondary | ICD-10-CM | POA: Diagnosis not present

## 2019-09-29 DIAGNOSIS — E119 Type 2 diabetes mellitus without complications: Secondary | ICD-10-CM | POA: Diagnosis not present

## 2019-09-29 NOTE — Telephone Encounter (Addendum)
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 She felt as though she was trying and the staff was pushing and although deep down she was grateful- she was dreading and irritated. Stated she ultimately has been thankful.   She is having 2 shakes a day and one full meal a day with jello and apple sauce for snacks   She has been parking her car every day where she knows she has to go back and pick it up (approximately 1 mile in 30 minutes)   Fitness tracker app glitched on phone and has been unable to sync--working on this now  She has been getting 4,000 steps a day on average.   Celebrations:  She emailed her plan to drop her daughter off at school and then park her car and walk a mile in the morning, which was motivating for her.   She is overjoyed with her breakthrough with weight loss. She says all of the tough times in the last week, and the suffering so far- "they feel worth it"  She feels more motivated by this and wants to continue   She was able to fit into a size smaller pants  The clinic and staff celebrating her weight loss was greatly impactful for her, as she was proud to share her success and "let the doctors see it for themselves"    Challenges: . Again, she says she has been hungry and has been moody/snappy and emotional. She is struggling with this.  . She is worried her body is used to the liquid diet (1 full week last week). Monday, she had liquids for all meals except dinner and she felt sick with that solid meal.   Weekly goal: - Walk with HF Staff member on Thursday with (myself, Annice Pih or  [place]) 8am 09/30/19 - Walk 5 days a week (at least 1 mile a day) - Achieve >5,000 steps a day - Achieve 8-10 miles a week  - Continue 2 shakes a day with one full meal     Next meeting: Thursday 8am at clinic for Heart Walk 09/30/19         Wednesday 10/06/19 via Telephone at 9am    Time Spent coaching patient: 35 mins    Kirsten Hausen, MS, ACSM-RCEP Clinical Exercise Physiologist

## 2019-10-01 ENCOUNTER — Telehealth (HOSPITAL_COMMUNITY): Payer: Self-pay

## 2019-10-01 ENCOUNTER — Telehealth (HOSPITAL_COMMUNITY): Payer: Self-pay | Admitting: *Deleted

## 2019-10-01 NOTE — Telephone Encounter (Signed)
Contacted patient to change planned walking visit at the hospital from 8am Thursday 1/21 to Wednesday 8am 1/20 due to expected inclement weather (rain) on Thursday. This visit on Wednesday will likely include her weekly Health Coaching follow-up.   Of note, patient walked 1.5 miles in 45 minutes (Heart Walk at [facility] trail) on Thursday 1/14. She felt well and completed 10,000 steps by the end of the day.    Lesia Hausen, MS, ACSM-RCEP Clinical Exercise Physiologist

## 2019-10-01 NOTE — Telephone Encounter (Signed)
Called pt to make aware that disability renewal paper work is finished from our perspective and needs to be picked up. No answer and no voicemail.

## 2019-10-05 ENCOUNTER — Telehealth (HOSPITAL_COMMUNITY): Payer: Self-pay | Admitting: Licensed Clinical Social Worker

## 2019-10-05 NOTE — Telephone Encounter (Signed)
Pt came in for a walk this morning.  CSW and pt walked 1.2 miles in approx 33 minutes- pt feeling ok throughout- feels as if she has some extra fluid on her as she forgot to take her metolazone on Friday- will plan to take it today.  Pt also scheduled to walk with Exercise physiologist tomorrow morning at 8am- pt reminded of appt and still plans to come.  Pt with no concerns at this time- CSW will continue to follow and assist as needed  Burna Sis, LCSW Clinical Social Worker Advanced Heart Failure Clinic Desk#: [phone] Cell#: [phone]

## 2019-10-06 ENCOUNTER — Other Ambulatory Visit (HOSPITAL_COMMUNITY): Payer: Self-pay

## 2019-10-06 ENCOUNTER — Encounter (HOSPITAL_COMMUNITY): Payer: Self-pay | Admitting: *Deleted

## 2019-10-06 DIAGNOSIS — Z7984 Long term (current) use of oral hypoglycemic drugs: Secondary | ICD-10-CM | POA: Diagnosis not present

## 2019-10-06 DIAGNOSIS — Z5181 Encounter for therapeutic drug level monitoring: Secondary | ICD-10-CM | POA: Diagnosis not present

## 2019-10-06 DIAGNOSIS — Z452 Encounter for adjustment and management of vascular access device: Secondary | ICD-10-CM | POA: Diagnosis not present

## 2019-10-06 DIAGNOSIS — O903 Peripartum cardiomyopathy: Secondary | ICD-10-CM | POA: Diagnosis not present

## 2019-10-06 DIAGNOSIS — F17210 Nicotine dependence, cigarettes, uncomplicated: Secondary | ICD-10-CM | POA: Diagnosis not present

## 2019-10-06 DIAGNOSIS — I5023 Acute on chronic systolic (congestive) heart failure: Secondary | ICD-10-CM | POA: Diagnosis not present

## 2019-10-06 DIAGNOSIS — E119 Type 2 diabetes mellitus without complications: Secondary | ICD-10-CM | POA: Diagnosis not present

## 2019-10-06 DIAGNOSIS — Z6841 Body Mass Index (BMI) 40.0 and over, adult: Secondary | ICD-10-CM | POA: Diagnosis not present

## 2019-10-06 DIAGNOSIS — I340 Nonrheumatic mitral (valve) insufficiency: Secondary | ICD-10-CM | POA: Diagnosis not present

## 2019-10-06 DIAGNOSIS — J45909 Unspecified asthma, uncomplicated: Secondary | ICD-10-CM | POA: Diagnosis not present

## 2019-10-06 DIAGNOSIS — Z79899 Other long term (current) drug therapy: Secondary | ICD-10-CM | POA: Diagnosis not present

## 2019-10-06 MED ORDER — MAGNESIUM OXIDE -MG SUPPLEMENT 400 (240 MG) MG PO TABS: 400.0000 mg | ORAL_TABLET | Freq: Two times a day (BID) | ORAL | 6 refills | Status: DC

## 2019-10-06 NOTE — Progress Notes (Addendum)
Today Kirsten Wheeler arrived for her weekly follow-up for Exercise and Health Coaching as planned per her last visit with Kirsten Wheeler.  As planned, we walked the heart walk trail and completed the coaching appointment while walking for the 55 minutes. She arrived stating she was tired from poor sleep and slightly congested (states from fluid?) however she wanted to proceed with the walk. She was advised to contact the clinic or her primary care if symptoms worsened.   Distance Completed: 1.5 miles Time: 55 mins  Home weight (yesterday): 348 lbs  Last meeting with Health and Wellness Coach:Thursday 1/14  Overall, she felt as though her walk was more challenging than last week due to being off-routine with her metolazone and she felt as though she was still working to get the fluid in balance. She made an action plan to walk and exercise and intentional 45 mins (at least) 5 days a week. Her plan is to exercise M-Fri and rest Sat Kirsten Wheeler unless she misses a M-F day. She prefers her exercise and long walk to take place in the morning immediately following her school drops offs. She says this sets her up for a good day and she is more likely to comply with diet and better maintain her mood. She also can remain accountable with this schedule as she couples this with an existing habit/routine of school drop offs.     In Review:  Since we last met, she went skating for entertainment with her daughters and had a minor fall with minor injury to her left ankle that she is self-managing with ice and rest. She feels her ankle is improving and did not have limitations or complaints during her walk today. She also stated she has not slept much for 24 hours and is very tired today as her sleep cycle is very off. She is unable to identify at this time where and how her sleep cycle has been so drastically interrupted. She is maintaining her premier shakes and she is still enjoying this and thinks she is adjusting to the decrease in daily caloric  intake from her previous.   Exercise:  Monday: Walked 30 mins alone in and around her apt complex Tuesday: Walked 38 mins with Kirsten Wheeler, CSW on heart walk trail at [facility] Wednesday: Walked 55 mins with Kirsten Wheeler (RCEP/HWC) on heart walk trail (1.62mles).  Thursday: Plan to walk Heart walk trail with Kirsten Wheeler (Kirsten Wheeler RCEP/HWC) in the am hours.  Friday: Plans her normal walk with parking her car and walking a mile or more for 45 mins.   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 She was encouraged to MOVE but to be aware of her intensity on those two days of the week. With her 2 days of lower intensity exercise, she was educated by myself about the importance of resting for recovery and building endurance.    Accomplishments (self-reported) - time with her daughter doing something active and skating - she is proud of her commitment to exercise in the last week - she expressed further gratitude for the staff involvement in her weightloss and HF journey.  - she reflected from 3 months ago when she was ready to give up and die to finding the will and drive to fight and make it there (she credits the HF clinic staff greatly in this). - she is feeling a difference in her body habitus from 3 months ago    Challenges/Frystrations (self-reported) - at time she  is still battling the hunger/mood swings - she feels lonely as she has had a change in her relationships and friendships. All of which she feels are better for her but are requiring adjustments.   Action plan:  She is progressing to developing a habit with walking/exercising 5/7 days a week with options for additional lower intensity movement on 2/7 days. She will continue with her premier shakes and her current diet regimen as she does not want to make any changes at this time. She will meet with RCEP/HWC on Thursday for Heart Walking and Friday she will take her normal route for 45+ mins.   Next appointment for walking and  health coaching will be set up tomorrow for her heart walk. We will work on improving her pace and walking efficiency at this time.    Time spent with patient: 55 mins    Kirsten Martins, MS, ACSM-RCEP Clinical Exercise Physiologist/ Health and Wellness Coach

## 2019-10-06 NOTE — Progress Notes (Signed)
Paramedicine Encounter    [name], female    DOB: Apr 28, 1981, 39 y.o.   MRN: [id number]   Patient Care Team: System, Provider Not In as PCP - General Regan Lemming, MD as PCP - Electrophysiology (Cardiology) Laurey Morale, MD as PCP - Advanced Heart Failure (Cardiology)  Patient Active Problem List   Diagnosis Date Noted  . Acute on chronic systolic CHF (congestive heart failure) (HCC) 06/17/2019  . Obstructive sleep apnea 06/17/2019  . 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 Diabetes mellitus (HCC) 06/17/2019  . Amenorrhea 08/03/2018  . Congestive heart failure (HCC) 06/29/2018  . Hypotension 06/07/2018  . Rhinovirus infection 06/06/2018  . Asthma 06/05/2018  . Acute respiratory failure with hypoxia (HCC) 06/05/2018  . Hypokalemia 06/05/2018  . Snoring 09/23/2016  . Cough 04/10/2016  . 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 Tobacco abuse 01/25/2016  . Acute on chronic combined systolic and diastolic CHF (congestive heart failure) (HCC) 01/25/2016  . Mitral regurgitation   . Abnormal EKG-inf TWI 01/15/2016  . Positive D dimer-CTA neg for PE 01/15/2016  . Morbid obesity- BMI 57 01/14/2016  . Abdominal pain 01/14/2016  . 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 At risk for sleep apnea 01/14/2016  . Cardiomyopathy (HCC) 11/01/2015  . Morbid obesity with BMI of 50.0-59.9, adult (HCC) 08/16/2015    Current Outpatient Medications:  .  atorvastatin (LIPITOR) 10 MG tablet, Take 1 tablet (10 mg total) by mouth daily., Disp: 90 tablet, Rfl: 3 .  carvedilol (COREG) 3.125 MG tablet, Take 1 tablet (3.125 mg total) by mouth 2 (two) times daily with a meal., Disp: 60 tablet, Rfl: 5 .  digoxin (LANOXIN) 0.125 MG tablet, Take 1 tablet (0.125 mg total) by mouth daily., Disp: 90 tablet, Rfl: 3 .  ipratropium-albuterol (DUONEB) 0.5-2.5 (3) MG/3ML SOLN, Take 3 mLs by nebulization every 4 (four) hours as needed., Disp: 360 mL,  Rfl: 0 .  ivabradine (CORLANOR) 7.5 MG TABS tablet, Take 1 tablet (7.5 mg total) by mouth 2 (two) times daily with a meal., Disp: 60 tablet, Rfl: 3 .  Magnesium Oxide 400 (240 Mg) MG TABS, Take 1 tablet (400 mg total) by mouth 2 (two) times daily., Disp: 60 tablet, Rfl: 6 .  metolazone (ZAROXOLYN) 2.5 MG tablet, TAKE 1 TABLET(2.5 MG) BY MOUTH 1 TIME A WEEK, Disp: 10 tablet, Rfl: 3 .  milrinone (PRIMACOR) 20 MG/100 ML SOLN infusion, Inject 0.0414 mg/min into the vein continuous., Disp:  , Rfl:  .  potassium chloride SA (KLOR-CON) 20 MEQ tablet, Take 3 tablets (60 mEq total) by mouth 3 (three) times daily. Take an additional with metolazone., Disp: 280 tablet, Rfl: 3 .  PRESCRIPTION MEDICATION, CPAP- AT BEDTIME, Disp: , Rfl:  .  sacubitril-valsartan (ENTRESTO) 24-26 MG, Take 1 tablet by mouth 2 (two) times daily., Disp: 60 tablet, Rfl: 5 .  spironolactone (ALDACTONE) 25 MG tablet, Take 1 tablet (25 mg total) by mouth daily., Disp: 90 tablet, Rfl: 3 .  torsemide (DEMADEX) 20 MG tablet, Take 4 tablets (80 mg total) by mouth 2 (two) times daily., Disp: 720 tablet, Rfl: 3 .  albuterol (PROAIR HFA) 108 (90 Base) MCG/ACT inhaler, Inhale 2 puffs into the lungs every 6 (six) hours as needed for wheezing or shortness of breath., Disp: , Rfl:  .  glipiZIDE (GLUCOTROL) 5 MG tablet, Take 1 tablet (5 mg total) by mouth daily., Disp: 90 tablet, Rfl: 0 Allergies  Allergen Reactions  . Metformin And Related Diarrhea  And caused severe "dry mouth," also      Social History   Socioeconomic History  . Marital status: Single    Spouse name: Not on file  . Number of children: 2  . Years of education: Not on file  . Highest education level: Not on file  Occupational History  . Not on file  Tobacco Use  . Smoking status: Former Smoker    Quit date: 10/19/2015    Years since quitting: 3.9  . Smokeless tobacco: Never Used  Substance and Sexual Activity  . Alcohol use: Yes    Alcohol/week: 0.0  standard drinks    Comment: occasionally  . Drug use: Yes    Types: Marijuana    Comment: only socially  . Sexual activity: Yes  Other Topics Concern  . Not on file  Social History Narrative  . Not on file   Social Determinants of Health   Financial Resource Strain:   . Difficulty of Paying Living Expenses: Not on file  Food Insecurity:   . Worried About Charity fundraiser in the Last Year: Not on file  . Ran Out of Food in the Last Year: Not on file  Transportation Needs:   . Lack of Transportation (Medical): Not on file  . Lack of Transportation (Non-Medical): Not on file  Physical Activity:   . Days of Exercise per Week: Not on file  . Minutes of Exercise per Session: Not on file  Stress:   . Feeling of Stress : Not on file  Social Connections:   . Frequency of Communication with Friends and Family: Not on file  . Frequency of Social Gatherings with Friends and Family: Not on file  . Attends Religious Services: Not on file  . Active Member of Clubs or Organizations: Not on file  . Attends Archivist Meetings: Not on file  . Marital Status: Not on file  Intimate Partner Violence:   . Fear of Current or Ex-Partner: Not on file  . Emotionally Abused: Not on file  . Physically Abused: Not on file  . Sexually Abused: Not on file    Physical Exam      Future Appointments  Date Time Provider [place]  10/25/2019  7:45 AM [facility] DEVICE REMOTES [facility]  10/25/2019  9:00 AM [facility] PA/NP [facility] None  12/27/2019 11:20 AM Larey Dresser, MD [facility] None  01/24/2020  7:45 AM [facility] DEVICE REMOTES [facility]  04/24/2020  7:45 AM [facility] DEVICE REMOTES [facility]  07/24/2020  7:45 AM [facility] DEVICE REMOTES [facility]    BP 90/60   Pulse 90   Temp (!) 97.5 F (36.4 C)   Resp 20   SpO2 98%   Weight yesterday-348 Last visit weight-345  Pt exercised with kristen this morning,  so far today she has made over 7000 steps. She does feel very tired and sob,. She feels like she has fluid on her today. She just looks tired.  She took her metolazone today. She told me she did end up taking her metolazone last Friday.  She is breathing heavy today. Pt states she feels congested.  She is having trouble sleeping.  Some of the trouble sleeping is coming from her not feeling comfortable when she is sleeping  She does have a cough, slight wheezy in all fields.  Called in refill fof digxon, entresto  and magnesium for her, she is out of her corlanor and she is due to get that picked up.  Had to call clinic to get the mag sent over again for the correct instructions.  meds verified and pill box refilled.  She is not using her CPAP due to it being uncomfortable for her so I will ask jenna to see if she can get a new mask that just covers her mouth.  She reports using it about only 3 hrs a night. Maybe a new mask will suit her better as she pulls this one off her face multiple times a night and ends up just leaving it off.   Kerry Hough, EMT-Paramedic [phone] [facility] Paramedic  10/06/19

## 2019-10-08 ENCOUNTER — Telehealth (HOSPITAL_COMMUNITY): Payer: Self-pay

## 2019-10-08 ENCOUNTER — Telehealth (HOSPITAL_COMMUNITY): Payer: Self-pay | Admitting: Licensed Clinical Social Worker

## 2019-10-08 NOTE — Telephone Encounter (Signed)
HH orders signed and faxed back to [facility] home.

## 2019-10-08 NOTE — Telephone Encounter (Signed)
CSW called pt to check in regarding request for new CPAP mask- would like one that only covers her mouth as she thinks this would be more comfortable and encourage her to wear it for longer- currently only wears part of the night because its uncomfortable.  CSW called [facility] to inquire about ordering pt a new CPAP mask- they are looking into it but think they should be able to set up appt with pt once they receive an order for a CPAP so they can find appropriate mask for her.  Pt then revealed that her electricity has been turned off due to nonpayment.  Pt owes about $1,600 total but [employer] requires $230 to turn back on the lights and then will make a payment plan with her to pay back the rest.  Pt reports that she was approved for LEAP program earlier this week but they messed up and sent the approval notice to [employer] and not [employer]- pt trying to work out how to correct this with DSS but is concerned it will not be taken care of before the weekend and will leave her without electricity.  CSW able to pay $230 through [employer] to get electricity turned back on today- patient very appreciative of this help and set up payment plan with [employer] representative while CSW was on the line.  CSW will continue to follow pt and assist as needed  Burna Sis, LCSW Clinical Social Worker [facility] Desk#: [phone] Cell#: [phone]

## 2019-10-11 ENCOUNTER — Other Ambulatory Visit (HOSPITAL_COMMUNITY): Payer: Self-pay

## 2019-10-11 ENCOUNTER — Telehealth (HOSPITAL_COMMUNITY): Payer: Self-pay | Admitting: *Deleted

## 2019-10-11 ENCOUNTER — Telehealth (HOSPITAL_COMMUNITY): Payer: Self-pay | Admitting: Licensed Clinical Social Worker

## 2019-10-11 ENCOUNTER — Telehealth (HOSPITAL_COMMUNITY): Payer: Self-pay

## 2019-10-11 DIAGNOSIS — G4730 Sleep apnea, unspecified: Secondary | ICD-10-CM

## 2019-10-11 NOTE — Telephone Encounter (Signed)
CSW informed by [facility] on Friday that they would be unable to help with getting new CPAP mask because they are not the provider of the CPAP.  CSW spoke with pt Friday who believes that the agency she gets the CPAP through has "[facility]" in it.  CSW able to get number for [facility] who confirms they supplied the CPAP for patient.  [facility] reports that pt has not been using the CPAP consistently according to their records so they have been trying to get the CPAP back from the patient.  CSW inquired about if this process could be reversed because the patient is reporting noncompliance due to the mask being uncomfortable which is the problem i'm trying to address with them.  They stated that they could not reverse the order to pick up the CPAP due to insurance so the patient would need the CPAP picked up then the whole process started over again including a new sleep study.  CSW spoke with pt regarding this and she expressed understanding that we would have to order a new CPAP for her and get new sleep study.  CSW will continue to follow and assist as needed  Burna Sis, LCSW Clinical Social Worker [facility] Desk#: [phone] Cell#: [phone]

## 2019-10-11 NOTE — Progress Notes (Signed)
Sleep study sent in per [name] NP-C so that pt can be re-certified to get a cpap machine.

## 2019-10-11 NOTE — Telephone Encounter (Signed)
Plan of care orders signed and faxed to [facility] home health and hospice. Copy placed in folder for [facility].

## 2019-10-11 NOTE — Telephone Encounter (Signed)
Patient called to update of her weekend exercise and to confirm this week's plan for appointments for coaching and walking. She plans to meet with me RCEP/EWC for walking and coaching appointment tomorrow morning 1/26 at 8am and Thursday 1/28 at 8 am. Will adjust according to inclement weather with indoor options.   She was able to walk 1.8 miles this morning in 33 minutes.     Lesia Hausen, MS, ACSM-RCEP Clinical Exercise Physiologist/ Health and Wellness Coach

## 2019-10-12 DIAGNOSIS — O903 Peripartum cardiomyopathy: Secondary | ICD-10-CM | POA: Diagnosis not present

## 2019-10-12 DIAGNOSIS — Z79899 Other long term (current) drug therapy: Secondary | ICD-10-CM | POA: Diagnosis not present

## 2019-10-12 DIAGNOSIS — I270 Primary pulmonary hypertension: Secondary | ICD-10-CM | POA: Diagnosis not present

## 2019-10-12 DIAGNOSIS — Z452 Encounter for adjustment and management of vascular access device: Secondary | ICD-10-CM | POA: Diagnosis not present

## 2019-10-12 DIAGNOSIS — I5023 Acute on chronic systolic (congestive) heart failure: Secondary | ICD-10-CM | POA: Diagnosis not present

## 2019-10-12 DIAGNOSIS — Z6841 Body Mass Index (BMI) 40.0 and over, adult: Secondary | ICD-10-CM | POA: Diagnosis not present

## 2019-10-12 DIAGNOSIS — I340 Nonrheumatic mitral (valve) insufficiency: Secondary | ICD-10-CM | POA: Diagnosis not present

## 2019-10-12 DIAGNOSIS — Z9989 Dependence on other enabling machines and devices: Secondary | ICD-10-CM | POA: Diagnosis not present

## 2019-10-12 DIAGNOSIS — I428 Other cardiomyopathies: Secondary | ICD-10-CM | POA: Diagnosis not present

## 2019-10-12 DIAGNOSIS — I5022 Chronic systolic (congestive) heart failure: Secondary | ICD-10-CM | POA: Diagnosis not present

## 2019-10-12 DIAGNOSIS — E669 Obesity, unspecified: Secondary | ICD-10-CM | POA: Diagnosis not present

## 2019-10-12 DIAGNOSIS — E119 Type 2 diabetes mellitus without complications: Secondary | ICD-10-CM | POA: Diagnosis not present

## 2019-10-12 DIAGNOSIS — Z7984 Long term (current) use of oral hypoglycemic drugs: Secondary | ICD-10-CM | POA: Diagnosis not present

## 2019-10-12 DIAGNOSIS — G4733 Obstructive sleep apnea (adult) (pediatric): Secondary | ICD-10-CM | POA: Diagnosis not present

## 2019-10-12 DIAGNOSIS — F17210 Nicotine dependence, cigarettes, uncomplicated: Secondary | ICD-10-CM | POA: Diagnosis not present

## 2019-10-12 DIAGNOSIS — Z5181 Encounter for therapeutic drug level monitoring: Secondary | ICD-10-CM | POA: Diagnosis not present

## 2019-10-12 DIAGNOSIS — Z95810 Presence of automatic (implantable) cardiac defibrillator: Secondary | ICD-10-CM | POA: Diagnosis not present

## 2019-10-12 DIAGNOSIS — J45909 Unspecified asthma, uncomplicated: Secondary | ICD-10-CM | POA: Diagnosis not present

## 2019-10-12 DIAGNOSIS — E1165 Type 2 diabetes mellitus with hyperglycemia: Secondary | ICD-10-CM | POA: Diagnosis not present

## 2019-10-14 ENCOUNTER — Inpatient Hospital Stay (HOSPITAL_COMMUNITY): Admission: RE | Admit: 2019-10-14 | Payer: Medicaid Other | Source: Ambulatory Visit

## 2019-10-14 ENCOUNTER — Other Ambulatory Visit (HOSPITAL_COMMUNITY): Payer: Self-pay

## 2019-10-14 ENCOUNTER — Telehealth (HOSPITAL_COMMUNITY): Payer: Self-pay | Admitting: Adult Health

## 2019-10-14 ENCOUNTER — Telehealth (HOSPITAL_COMMUNITY): Payer: Self-pay

## 2019-10-14 ENCOUNTER — Encounter (HOSPITAL_COMMUNITY): Payer: Self-pay | Admitting: *Deleted

## 2019-10-14 DIAGNOSIS — I5022 Chronic systolic (congestive) heart failure: Secondary | ICD-10-CM

## 2019-10-14 NOTE — Telephone Encounter (Signed)
Received a call from HF Paramedic.   HH out on Tuesday and she verbalized tenderness around PICC at that time.  Naya called home health on Tuesday night. There was some concern about the dressing kit.   She reports pus around PICC for 3-4 days.  PICC line tender with pus under dressing.   Needs PICC exchanged. Will follow up with [facility].   Kirsten Wheeler /12:52 PM

## 2019-10-14 NOTE — Progress Notes (Signed)
Duplicate chart made in error

## 2019-10-14 NOTE — Telephone Encounter (Signed)
Kirsten Wheeler emt-p called to report picc line not looking appropriate. Reported site red with purulent drainage. States site is tender to touch and is missing biopatch. vss per emt-p. Called spoke with pt and arranged pt to have picc line replaced at [facility] at 8am 10/15/19. Orders placed. Pt verbalized understanding and ability to be at [facility] long 1st floor radiology at 8 am.

## 2019-10-14 NOTE — Progress Notes (Signed)
Paramedicine Encounter    [name], female    DOB: [birth year], 39 y.o.   MRN: [id number]   Patient Care Team: System, Provider Not In as PCP - General Constance Haw, MD as PCP - Electrophysiology (Cardiology) Larey Dresser, MD as PCP - Advanced Heart Failure (Cardiology)  Patient Active Problem List   Diagnosis Date Noted  . Acute on chronic systolic CHF (congestive heart failure) ([place]) 06/17/2019  . Obstructive sleep apnea 06/17/2019  . 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 Diabetes mellitus ([place]) 06/17/2019  . Amenorrhea 08/03/2018  . Congestive heart failure ([place]) 06/29/2018  . Hypotension 06/07/2018  . Rhinovirus infection 06/06/2018  . Asthma 06/05/2018  . Acute respiratory failure with hypoxia ([place]) 06/05/2018  . Hypokalemia 06/05/2018  . Snoring 09/23/2016  . Cough 04/10/2016  . NICM (nonischemic cardiomyopathy) ([place]) 01/25/2016  . Tobacco abuse 01/25/2016  . Acute on chronic combined systolic and diastolic CHF (congestive heart failure) ([place]) 01/25/2016  . Mitral regurgitation   . Abnormal EKG-inf TWI 01/15/2016  . Positive D dimer-CTA neg for PE 01/15/2016  . Morbid obesity- BMI 57 01/14/2016  . Abdominal pain 01/14/2016  . 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 At risk for sleep apnea 01/14/2016  . Cardiomyopathy ([place]) 11/01/2015  . Morbid obesity with BMI of 50.0-59.9, adult ([place]) 08/16/2015    Current Outpatient Medications:  .  albuterol (PROAIR HFA) 108 (90 Base) MCG/ACT inhaler, Inhale 2 puffs into the lungs every 6 (six) hours as needed for wheezing or shortness of breath., Disp: , Rfl:  .  atorvastatin (LIPITOR) 10 MG tablet, Take 1 tablet (10 mg total) by mouth daily., Disp: 90 tablet, Rfl: 3 .  carvedilol (COREG) 3.125 MG tablet, Take 1 tablet (3.125 mg total) by mouth 2 (two) times daily with a meal., Disp: 60 tablet, Rfl: 5 .  digoxin (LANOXIN) 0.125 MG tablet, Take 1 tablet (0.125 mg total) by  mouth daily., Disp: 90 tablet, Rfl: 3 .  ipratropium-albuterol (DUONEB) 0.5-2.5 (3) MG/3ML SOLN, Take 3 mLs by nebulization every 4 (four) hours as needed., Disp: 360 mL, Rfl: 0 .  ivabradine (CORLANOR) 7.5 MG TABS tablet, Take 1 tablet (7.5 mg total) by mouth 2 (two) times daily with a meal., Disp: 60 tablet, Rfl: 3 .  Magnesium Oxide 400 (240 Mg) MG TABS, Take 1 tablet (400 mg total) by mouth 2 (two) times daily., Disp: 60 tablet, Rfl: 6 .  metolazone (ZAROXOLYN) 2.5 MG tablet, TAKE 1 TABLET(2.5 MG) BY MOUTH 1 TIME A WEEK, Disp: 10 tablet, Rfl: 3 .  milrinone (PRIMACOR) 20 MG/100 ML SOLN infusion, Inject 0.0414 mg/min into the vein continuous., Disp:  , Rfl:  .  potassium chloride SA (KLOR-CON) 20 MEQ tablet, Take 3 tablets (60 mEq total) by mouth 3 (three) times daily. Take an additional 62meq with metolazone., Disp: 280 tablet, Rfl: 3 .  sacubitril-valsartan (ENTRESTO) 24-26 MG, Take 1 tablet by mouth 2 (two) times daily., Disp: 60 tablet, Rfl: 5 .  spironolactone (ALDACTONE) 25 MG tablet, Take 1 tablet (25 mg total) by mouth daily., Disp: 90 tablet, Rfl: 3 .  torsemide (DEMADEX) 20 MG tablet, Take 4 tablets (80 mg total) by mouth 2 (two) times daily., Disp: 720 tablet, Rfl: 3 .  glipiZIDE (GLUCOTROL) 5 MG tablet, Take 1 tablet (5 mg total) by mouth daily., Disp: 90 tablet, Rfl: 0 .  PRESCRIPTION MEDICATION, CPAP- AT BEDTIME, Disp: , Rfl:  Allergies  Allergen Reactions  . Metformin And Related Diarrhea  And caused severe "dry mouth," also      Social History   Socioeconomic History  . Marital status: Single    Spouse name: Not on file  . Number of children: 2  . Years of education: Not on file  . Highest education level: Not on file  Occupational History  . Not on file  Tobacco Use  . Smoking status: Former Smoker    Quit date: 10/19/2015    Years since quitting: 3.9  . Smokeless tobacco: Never Used  Substance and Sexual Activity  . Alcohol use: Yes    Alcohol/week: 0.0  standard drinks    Comment: occasionally  . Drug use: Yes    Types: Marijuana    Comment: only socially  . Sexual activity: Yes  Other Topics Concern  . Not on file  Social History Narrative  . Not on file   Social Determinants of Health   Financial Resource Strain:   . Difficulty of Paying Living Expenses: Not on file  Food Insecurity:   . Worried About Programme researcher, broadcasting/film/video in the Last Year: Not on file  . Ran Out of Food in the Last Year: Not on file  Transportation Needs:   . Lack of Transportation (Medical): Not on file  . Lack of Transportation (Non-Medical): Not on file  Physical Activity:   . Days of Exercise per Week: Not on file  . Minutes of Exercise per Session: Not on file  Stress:   . Feeling of Stress : Not on file  Social Connections:   . Frequency of Communication with Friends and Family: Not on file  . Frequency of Social Gatherings with Friends and Family: Not on file  . Attends Religious Services: Not on file  . Active Member of Clubs or Organizations: Not on file  . Attends Banker Meetings: Not on file  . Marital Status: Not on file  Intimate Partner Violence:   . Fear of Current or Ex-Partner: Not on file  . Emotionally Abused: Not on file  . Physically Abused: Not on file  . Sexually Abused: Not on file    Physical Exam      Future Appointments  Date Time Provider Department Center  10/25/2019  7:45 AM [facility] DEVICE REMOTES [facility]  10/25/2019  9:00 AM [facility] PA/NP [facility] None  12/27/2019 11:20 AM Laurey Morale, MD [facility] None  01/24/2020  7:45 AM [facility] DEVICE REMOTES [facility]  04/24/2020  7:45 AM [facility] DEVICE REMOTES [facility]  07/24/2020  7:45 AM [facility] DEVICE REMOTES [facility]    BP (!) 82/0   Pulse 88   Resp 18   Wt (!) 341 lb (154.7 kg)   SpO2 98%   BMI 56.75 kg/m   Weight yesterday-343 Last visit weight-348  Pt reports she feels"  jiggly and tired"  haha  From the walking this morning. She reports doing 2 miles this morning.  She ended up having to take her metolazone a day early. She took it on Tuesday instead of Wednesday.  She thinks it could have been from her daughters birthday party over the wknd-ate hamburger that a family member cooked, so not sure what all seasonings it was.  She is having tenderness to the line site and its been stinging her since Tuesday night. She did call home health and they didn't mention to come back out to look at it and im not sure what it looks like from week to week so I dont  have anything to compare it to. But it is red around the line site and tender to her. The stitch that was pulled loose before is also reddened and she said pus has been oozing out from that for several days.  I sent pic to [place] showed it to amy and any wants her to come in for a picc line exchange. Nurse from clinic will call her to schedule that.  She states her breathing is doing ok. She is still using her liquid diet protein shakes. Will f/u next week.  Kerry Hough, EMT-Paramedic [phone] [facility] Paramedic  10/14/19

## 2019-10-14 NOTE — Progress Notes (Signed)
Today Noe arrived for her weekly follow-up for Exercise and Health Coaching as planned per her last visit with me. We planned to walk Tuesday 1/26, however she had to cancel due to her daughter not feeling well.   Distance Completed: 2.4 miles  Time: 55 mins  Home weight (today): 341 lbs  Last meeting with Health and Wellness Coach:Thursday 1/20  Other completed exercise this week:  - Monday1/25 : Walked at home on her own route for 45 mins - Wednesday 1/28: walked heart walk trail with a friend at least one  loop  Planned Exercise:  - Friday 1/29: at least 1.5 mile walk with Eileen Stanford, CSW - Saturday 1/30: her own Walk (since she missed Tuesday)   Overall, the walk was more challenging for her today as she wanted to walk 2 loops of the heart trail with faster spurts. She was educated and demonstrated with practice, breathing techniques during exercise. She is continuing her existing diet with premier shakes and sugar free jello and applesauce snacks with one full meal at dinner.     Accomplishments (self-reported) - Able to take care of her daughter when she wasn't feeling well - She has a neighbor that has asked to walk with her because she needs help losing weight and managing her diabetes -she enjoys helping her neighbor and relating with her in regards to weight loss and her health - she shared how much fulfillment she received after helping and talking with her neighbor about exercise and the benefits/importance.  -She feels she has established a morning and weekly habit with her "walks" and her kids are supportive and ask her daily if she is going for her walk. They have expressed to her indirectly that they are proud of her.     Challenges/Frystrations (self-reported) - at time she is still battling the hunger/mood swings  - she did not want to exercise today and did not want to push  - existing discrepancies at her weight loss center, specifically surrounding the "Surgery"  for weight loss.    Action plan:  - continue Monday-Friday morning walks with plans to make up any missed days on Saturday.  - With her permission for recommendations, I suggested she not wait to have one of her already planned snacks to fall just after her exercise to help balance her glucose levels post-exercise. She will attempt this to see how this affects her hunger fluctuations and mood swings.  - continue her current diet with premier shakes and one full meal at dinner.  - Email Baxter Hire screenshot of full week report on Saturday and this will include her schedule plan for next week.    Time spent with patient: 60 minutes    Lesia Hausen, MS, ACSM-RCEP Clinical Exercise Physiologist/ Health and Wellness Coach

## 2019-10-15 ENCOUNTER — Other Ambulatory Visit (HOSPITAL_COMMUNITY): Payer: Self-pay | Admitting: Adult Health

## 2019-10-15 ENCOUNTER — Other Ambulatory Visit: Payer: Self-pay

## 2019-10-15 ENCOUNTER — Other Ambulatory Visit (HOSPITAL_COMMUNITY): Payer: Medicaid Other

## 2019-10-15 ENCOUNTER — Telehealth (HOSPITAL_COMMUNITY): Payer: Self-pay | Admitting: Licensed Clinical Social Worker

## 2019-10-15 ENCOUNTER — Ambulatory Visit (HOSPITAL_COMMUNITY)
Admission: RE | Admit: 2019-10-15 | Discharge: 2019-10-15 | Disposition: A | Discharge status: Home / Self Care | Payer: Medicaid Other | Source: Ambulatory Visit | Attending: Adult Health | Admitting: Adult Health

## 2019-10-15 DIAGNOSIS — I5022 Chronic systolic (congestive) heart failure: Secondary | ICD-10-CM

## 2019-10-15 DIAGNOSIS — Z4901 Encounter for fitting and adjustment of extracorporeal dialysis catheter: Secondary | ICD-10-CM | POA: Diagnosis not present

## 2019-10-15 DIAGNOSIS — Z452 Encounter for adjustment and management of vascular access device: Secondary | ICD-10-CM | POA: Diagnosis not present

## 2019-10-15 HISTORY — PX: IR REMOVAL TUN CV CATH W/O FL: IMG2289

## 2019-10-15 HISTORY — PX: IR US GUIDE VASC ACCESS RIGHT: IMG2390

## 2019-10-15 HISTORY — PX: IR FLUORO GUIDE CV LINE RIGHT: IMG2283

## 2019-10-15 MED ORDER — LIDOCAINE-EPINEPHRINE 1 %-1:100000 IJ SOLN
INTRAMUSCULAR | Status: AC
  Filled 2019-10-15: qty 1

## 2019-10-15 NOTE — Procedures (Signed)
CHF, milrinone infusion, infect access  S/p RT IJ TUNNELED PICC REMOVAL  S/P NEW RT IJ TUNNELED PICC AT A SEPARATE SITE  No comp Stable ebl min Tip svcra Ready for use Full report in pacs

## 2019-10-15 NOTE — Telephone Encounter (Signed)
CSW called pt to check in following PICC line replacement.  Pt reports doing well and that everything went smoothly with the procedure. No further concerns at this time- pt plans to workout both days this weekend to make up for the 2 days she skipped during the week.  CSW will continue to follow and assist as needed  Burna Sis, LCSW Clinical Social Worker [facility] Desk#: [phone] Cell#: [phone]

## 2019-10-19 ENCOUNTER — Other Ambulatory Visit: Payer: Self-pay

## 2019-10-19 ENCOUNTER — Telehealth (HOSPITAL_COMMUNITY): Payer: Self-pay | Admitting: *Deleted

## 2019-10-19 DIAGNOSIS — J45909 Unspecified asthma, uncomplicated: Secondary | ICD-10-CM | POA: Diagnosis not present

## 2019-10-19 DIAGNOSIS — O903 Peripartum cardiomyopathy: Secondary | ICD-10-CM | POA: Diagnosis not present

## 2019-10-19 DIAGNOSIS — I340 Nonrheumatic mitral (valve) insufficiency: Secondary | ICD-10-CM | POA: Diagnosis not present

## 2019-10-19 DIAGNOSIS — F17210 Nicotine dependence, cigarettes, uncomplicated: Secondary | ICD-10-CM | POA: Diagnosis not present

## 2019-10-19 DIAGNOSIS — Z452 Encounter for adjustment and management of vascular access device: Secondary | ICD-10-CM | POA: Diagnosis not present

## 2019-10-19 DIAGNOSIS — Z6841 Body Mass Index (BMI) 40.0 and over, adult: Secondary | ICD-10-CM | POA: Diagnosis not present

## 2019-10-19 DIAGNOSIS — E119 Type 2 diabetes mellitus without complications: Secondary | ICD-10-CM | POA: Diagnosis not present

## 2019-10-19 DIAGNOSIS — I5023 Acute on chronic systolic (congestive) heart failure: Secondary | ICD-10-CM | POA: Diagnosis not present

## 2019-10-19 DIAGNOSIS — Z79899 Other long term (current) drug therapy: Secondary | ICD-10-CM | POA: Diagnosis not present

## 2019-10-19 DIAGNOSIS — Z5181 Encounter for therapeutic drug level monitoring: Secondary | ICD-10-CM | POA: Diagnosis not present

## 2019-10-19 DIAGNOSIS — Z7984 Long term (current) use of oral hypoglycemic drugs: Secondary | ICD-10-CM | POA: Diagnosis not present

## 2019-10-19 MED ORDER — ONDANSETRON HCL 4 MG PO TABS: 4.0000 mg | ORAL_TABLET | Freq: Three times a day (TID) | ORAL | 0 refills | Status: DC | PRN

## 2019-10-19 NOTE — Telephone Encounter (Signed)
Pt spoke with [place] (Child psychotherapist) this morning. Pt c/o vomiting and diarrhea that started last night. Pt was dizzy last night but not today. Pt recently had her bathtub repaired and thought maybe the fumes from the sealant was the cause. Pt raised her windows and tried to air out her apartment. Pt recently had her PICC replaced but denies any redness, tenderness, drainage, or odor. Pt temperature 97. No other symptoms or complaints at this time. Per Marca Ancona keep an eye on symptoms if they worsen or dont improve call our office or go to the emergency room also send zofran to pharmacy. Pt and [name] aware of plan.

## 2019-10-22 ENCOUNTER — Encounter (HOSPITAL_COMMUNITY): Payer: Self-pay

## 2019-10-22 ENCOUNTER — Other Ambulatory Visit: Payer: Self-pay

## 2019-10-22 ENCOUNTER — Ambulatory Visit (HOSPITAL_COMMUNITY)
Admission: EM | Admit: 2019-10-22 | Discharge: 2019-10-22 | Disposition: A | Discharge status: Home / Self Care | Payer: Medicaid Other | Attending: Family Medicine | Admitting: Family Medicine

## 2019-10-22 ENCOUNTER — Telehealth (HOSPITAL_COMMUNITY): Payer: Self-pay | Admitting: Licensed Clinical Social Worker

## 2019-10-22 DIAGNOSIS — Z7984 Long term (current) use of oral hypoglycemic drugs: Secondary | ICD-10-CM | POA: Insufficient documentation

## 2019-10-22 DIAGNOSIS — Z8249 Family history of ischemic heart disease and other diseases of the circulatory system: Secondary | ICD-10-CM | POA: Diagnosis not present

## 2019-10-22 DIAGNOSIS — Z87891 Personal history of nicotine dependence: Secondary | ICD-10-CM | POA: Insufficient documentation

## 2019-10-22 DIAGNOSIS — Z20822 Contact with and (suspected) exposure to covid-19: Secondary | ICD-10-CM | POA: Diagnosis not present

## 2019-10-22 DIAGNOSIS — R197 Diarrhea, unspecified: Secondary | ICD-10-CM | POA: Diagnosis not present

## 2019-10-22 DIAGNOSIS — E119 Type 2 diabetes mellitus without complications: Secondary | ICD-10-CM | POA: Insufficient documentation

## 2019-10-22 DIAGNOSIS — R112 Nausea with vomiting, unspecified: Secondary | ICD-10-CM

## 2019-10-22 DIAGNOSIS — E876 Hypokalemia: Secondary | ICD-10-CM | POA: Insufficient documentation

## 2019-10-22 DIAGNOSIS — Z95810 Presence of automatic (implantable) cardiac defibrillator: Secondary | ICD-10-CM | POA: Insufficient documentation

## 2019-10-22 DIAGNOSIS — Z79899 Other long term (current) drug therapy: Secondary | ICD-10-CM | POA: Insufficient documentation

## 2019-10-22 DIAGNOSIS — I340 Nonrheumatic mitral (valve) insufficiency: Secondary | ICD-10-CM | POA: Diagnosis not present

## 2019-10-22 DIAGNOSIS — I429 Cardiomyopathy, unspecified: Secondary | ICD-10-CM | POA: Diagnosis not present

## 2019-10-22 DIAGNOSIS — Z6841 Body Mass Index (BMI) 40.0 and over, adult: Secondary | ICD-10-CM | POA: Diagnosis not present

## 2019-10-22 NOTE — ED Provider Notes (Signed)
[facility]    CSN: [id number] Arrival date & time: 10/22/19  1456      History   Chief Complaint Chief Complaint  Patient presents with  . Nausea  . Diarrhea    HPI Kirsten Wheeler is a 39 y.o. female.   Kirsten Wheeler presents with complaints of vomiting 2-3 times over the past week. Nausea. Diarrhea daily, multiple episodes. Already multiple episodes today. Has had accidents over night with stool. Nausea is worse with eating. When she drinks water it makes her vomit. Ginger ale stays down, however, and she has been drinking this regularly. Stomach feels "sour" but no pain. No dizziness or light headedness. No fevers. No URI symptoms. Cough due to CHF, no change from baseline according to patient. Daughters had diarrhea prior to her onset, both have improved since. Normal urination. Today was able to eat chicken noodle soup. Took nausea medication this morning which did seem to help. No blood or black in diarrhea or emesis. No recent hospitalizations or antibiotics. No recent travel. History  Of dm, obesity, asthma, chf, mitral regurgitation    ROS per HPI, negative if not otherwise mentioned.      Past Medical History:  Diagnosis Date  . Asthma   . Chronic systolic CHF (congestive heart failure) (HCC)    a. ECHO 01/15/2016 EF 10-15%. 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  . Diabetes mellitus (HCC) 06/17/2019  . Hearing loss    bilateral  . Mitral regurgitation    a. severe, felt to be functional 2/2 LV dilation   . Morbid obesity (HCC)   . Snoring     Patient Active Problem List   Diagnosis Date Noted  . Acute on chronic systolic CHF (congestive heart failure) (HCC) 06/17/2019  . Obstructive sleep apnea 06/17/2019  . 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 Diabetes mellitus (HCC) 06/17/2019  . Amenorrhea 08/03/2018  . Congestive heart failure (HCC) 06/29/2018  . Hypotension 06/07/2018  . Rhinovirus infection 06/06/2018  .  Asthma 06/05/2018  . Acute respiratory failure with hypoxia (HCC) 06/05/2018  . Hypokalemia 06/05/2018  . Snoring 09/23/2016  . Cough 04/10/2016  . 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 Tobacco abuse 01/25/2016  . Acute on chronic combined systolic and diastolic CHF (congestive heart failure) (HCC) 01/25/2016  . Mitral regurgitation   . Abnormal EKG-inf TWI 01/15/2016  . Positive D dimer-CTA neg for PE 01/15/2016  . Morbid obesity- BMI 57 01/14/2016  . Abdominal pain 01/14/2016  . 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 At risk for sleep apnea 01/14/2016  . Cardiomyopathy (HCC) 11/01/2015  . Morbid obesity with BMI of 50.0-59.9, adult (HCC) 08/16/2015    Past Surgical History:  Procedure Laterality Date  . CARDIAC CATHETERIZATION N/A 01/18/2016   Procedure: Right/Left Heart Cath and Coronary Angiography;  Surgeon: Laurey Morale, MD;  Location: [facility] INVASIVE CV LAB;  Service: Cardiovascular;  Laterality: N/A;  . CESAREAN SECTION    . ICD IMPLANT N/A 04/17/2017   Procedure: ICD Implant;  Surgeon: Regan Lemming, MD;  Location: [facility] INVASIVE CV LAB;  Service: Cardiovascular;  Laterality: N/A;  . IR FLUORO GUIDE CV LINE RIGHT  06/23/2019  . IR FLUORO GUIDE CV LINE RIGHT  10/15/2019  . IR REMOVAL TUN CV CATH W/O FL  10/15/2019  . IR US GUIDE VASC ACCESS RIGHT  06/23/2019  . IR US GUIDE VASC ACCESS RIGHT  10/15/2019  . LEAD REVISION/REPAIR N/A 04/21/2017   Procedure: Lead Revision/Repair;  Surgeon: Elberta Fortis,  Andree Coss, MD;  Location: [facility] INVASIVE CV LAB;  Service: Cardiovascular;  Laterality: N/A;  . RIGHT HEART CATH N/A 06/21/2019   Procedure: RIGHT HEART CATH;  Surgeon: Laurey Morale, MD;  Location: [facility] INVASIVE CV LAB;  Service: Cardiovascular;  Laterality: N/A;  . TONSILLECTOMY      OB History   No obstetric history on file.      Home Medications    Prior to Admission medications   Medication Sig Start Date End Date Taking? Authorizing Provider  ondansetron  (ZOFRAN) 4 MG tablet Take 1 tablet (4 mg total) by mouth every 8 (eight) hours as needed for nausea or vomiting. 10/19/19  Yes Laurey Morale, MD  albuterol [facility] HFA) [phone] Base) MCG/ACT inhaler Inhale 2 puffs into the lungs every 6 (six) hours as needed for wheezing or shortness of breath.    [provider]  atorvastatin (LIPITOR) 10 MG tablet Take 1 tablet (10 mg total) by mouth daily. 07/07/19   Robbie Lis M, PA-C  carvedilol (COREG) 3.125 MG tablet Take 1 tablet (3.125 mg total) by mouth 2 (two) times daily with a meal. 08/02/19   Laurey Morale, MD  digoxin (LANOXIN) 0.125 MG tablet Take 1 tablet (0.125 mg total) by mouth daily. 06/25/19   Robbie Lis M, PA-C  glipiZIDE (GLUCOTROL) 5 MG tablet Take 1 tablet (5 mg total) by mouth daily. 06/24/19 09/27/19  Jerald Kief, MD  ipratropium-albuterol (DUONEB) 0.5-2.5 (3) MG/3ML SOLN Take 3 mLs by nebulization every 4 (four) hours as needed. 06/08/18   Laverna Peace, MD  ivabradine (CORLANOR) 7.5 MG TABS tablet Take 1 tablet (7.5 mg total) by mouth 2 (two) times daily with a meal. 08/17/19   Clegg, [name] D, NP  Magnesium Oxide 400 (240 Mg) MG TABS Take 1 tablet (400 mg total) by mouth 2 (two) times daily. 10/06/19   Clegg, [name] D, NP  metolazone (ZAROXOLYN) 2.5 MG tablet TAKE 1 TABLET(2.5 MG) BY MOUTH 1 TIME A WEEK 09/16/19   Laurey Morale, MD  milrinone [facility]) 20 MG/100 ML SOLN infusion Inject 0.0414 mg/min into the vein continuous. 06/25/19   Robbie Lis M, PA-C  potassium chloride SA (KLOR-CON) 20 MEQ tablet Take 3 tablets (60 mEq total) by mouth 3 (three) times daily. Take an additional with metolazone. 07/20/19   Clegg, [name] D, NP  PRESCRIPTION MEDICATION CPAP- AT BEDTIME    [provider]  sacubitril-valsartan (ENTRESTO) 24-26 MG Take 1 tablet by mouth 2 (two) times daily. 08/02/19   Laurey Morale, MD  spironolactone (ALDACTONE) 25 MG tablet Take 1 tablet (25 mg total) by mouth daily.  06/25/19   Robbie Lis M, PA-C  torsemide (DEMADEX) 20 MG tablet Take 4 tablets (80 mg total) by mouth 2 (two) times daily. 07/07/19   Allayne Butcher, PA-C    Family History Family History  Problem Relation Age of Onset  . Heart attack Mother   . Asthma Mother   . Heart failure Mother   . Hypertension Father   . Diabetes Brother     Social History Social History   Tobacco Use  . Smoking status: Former Smoker    Quit date: 10/19/2015    Years since quitting: 4.0  . Smokeless tobacco: Never Used  Substance Use Topics  . Alcohol use: Yes    Alcohol/week: 0.0 standard drinks    Comment: occasionally  . Drug use: Yes    Types: Marijuana    Comment: only socially  Allergies   Metformin and related   Review of Systems Review of Systems   Physical Exam Triage Vital Signs ED Triage Vitals  Enc Vitals Group     BP 10/22/19 1531 110/80     Pulse Rate 10/22/19 1531 91     Resp 10/22/19 1531 20     Temp 10/22/19 1531 98.1 F (36.7 C)     Temp Source 10/22/19 1531 Oral     SpO2 10/22/19 1531 99 %     Weight --      Height --      Head Circumference --      Peak Flow --      Pain Score 10/22/19 1529 0     Pain Loc --      Pain Edu? --      Excl. in GC? --    No data found.  Updated Vital Signs BP 110/80 (BP Location: Right Wrist)   Pulse 91   Temp 98.1 F (36.7 C) (Oral)   Resp 20   SpO2 99%    Physical Exam Constitutional:      General: She is not in acute distress.    Appearance: She is well-developed. She is obese.  Cardiovascular:     Rate and Rhythm: Normal rate.  Pulmonary:     Effort: Pulmonary effort is normal.  Abdominal:     Palpations: Abdomen is soft.     Tenderness: There is no abdominal tenderness. There is no right CVA tenderness, left CVA tenderness, guarding or rebound.  Skin:    General: Skin is warm and dry.  Neurological:     Mental Status: She is alert and oriented to person, place, and time.      UC  Treatments / Results  Labs (all labs ordered are listed, but only abnormal results are displayed) Labs Reviewed  NOVEL CORONAVIRUS, NAA (HOSP ORDER, SEND-OUT TO REF LAB; TAT 18-24 HRS)    EKG   Radiology No results found.  Procedures Procedures (including critical care time)  Medications Ordered in UC Medications - No data to display  Initial Impression / Assessment and Plan / UC Course  I have reviewed the triage vital signs and the nursing notes.  Pertinent labs & imaging results that were available during my care of the patient were reviewed by me and considered in my medical decision making (see chart for details).     Non toxic. Benign physical exam. Patient endorses that she is overall feeling improved, but symptoms have not completely resolved. Taking soups and liquids. Normal urination. No fevers. No acute findings on physical exam. Daughters had similar illness just prior too her onset. Suspect viral illness, covid testing collected and pending. Return precautions provided. Patient verbalized understanding and agreeable to plan.   Final Clinical Impressions(s) / UC Diagnoses   Final diagnoses:  Nausea vomiting and diarrhea     Discharge Instructions     Your exam is reassuring today.  Small frequent sips of fluids- Pedialyte, Gatorade, water, broth- to maintain hydration.   Bland diet as tolerated.  Your nausea medication as needed.  Self isolate until covid results are back and negative.  Will notify you by phone of any positive findings from your covid testing. Your negative results will be sent through your MyChart.     If worsening of symptom- pain, fevers, blood in vomit or stool, weakness or dizziness, or no improvement in the next week please return or if severe please go to the ER.  ED Prescriptions    None     PDMP not reviewed this encounter.   Zigmund Gottron, NP 10/23/19 1005

## 2019-10-22 NOTE — Telephone Encounter (Signed)
CSW informed by patient that her dentures were lost and that insurance won't pay for another set for 2 more years.  CSW called Medicaid benefits and they reported that dentist can try to submit a prior auth with a letter explaining the situation to be reviewed by a clinician- CSW updated pt  CSW will continue to follow and assist as needed  Burna Sis, LCSW Clinical Social Worker [facility] Desk#: [phone] Cell#: [phone]

## 2019-10-22 NOTE — ED Triage Notes (Signed)
Patient presents to Urgent Care with complaints of diarrhea and nausea since about a week ago. Patient reports she had a few episodes of vomiting throughout the week, states her cardiologist prescribed a nausea medication that starts with "n" but she is unsure of the name.

## 2019-10-22 NOTE — Discharge Instructions (Signed)
Your exam is reassuring today.  Small frequent sips of fluids- Pedialyte, Gatorade, water, broth- to maintain hydration.   Bland diet as tolerated.  Your nausea medication as needed.  Self isolate until covid results are back and negative.  Will notify you by phone of any positive findings from your covid testing. Your negative results will be sent through your MyChart.     If worsening of symptom- pain, fevers, blood in vomit or stool, weakness or dizziness, or no improvement in the next week please return or if severe please go to the ER.

## 2019-10-23 LAB — NOVEL CORONAVIRUS, NAA (HOSP ORDER, SEND-OUT TO REF LAB; TAT 18-24 HRS): SARS-CoV-2, NAA: NOT DETECTED

## 2019-10-24 DIAGNOSIS — I5023 Acute on chronic systolic (congestive) heart failure: Secondary | ICD-10-CM

## 2019-10-25 ENCOUNTER — Ambulatory Visit (HOSPITAL_COMMUNITY)
Admission: RE | Admit: 2019-10-25 | Discharge: 2019-10-25 | Disposition: A | Discharge status: Home / Self Care | Payer: Medicaid Other | Source: Ambulatory Visit | Attending: Adult Health | Admitting: Adult Health

## 2019-10-25 ENCOUNTER — Other Ambulatory Visit: Payer: Self-pay

## 2019-10-25 ENCOUNTER — Other Ambulatory Visit (HOSPITAL_COMMUNITY): Payer: Self-pay

## 2019-10-25 ENCOUNTER — Ambulatory Visit (INDEPENDENT_AMBULATORY_CARE_PROVIDER_SITE_OTHER): Payer: Medicaid Other | Admitting: *Deleted

## 2019-10-25 ENCOUNTER — Encounter (HOSPITAL_COMMUNITY): Payer: Self-pay

## 2019-10-25 VITALS — BP 128/74 | HR 90 | Wt 344.8 lb

## 2019-10-25 DIAGNOSIS — Z95810 Presence of automatic (implantable) cardiac defibrillator: Secondary | ICD-10-CM

## 2019-10-25 DIAGNOSIS — Z87891 Personal history of nicotine dependence: Secondary | ICD-10-CM | POA: Diagnosis not present

## 2019-10-25 DIAGNOSIS — G4730 Sleep apnea, unspecified: Secondary | ICD-10-CM | POA: Diagnosis not present

## 2019-10-25 DIAGNOSIS — E119 Type 2 diabetes mellitus without complications: Secondary | ICD-10-CM | POA: Diagnosis not present

## 2019-10-25 DIAGNOSIS — Z833 Family history of diabetes mellitus: Secondary | ICD-10-CM | POA: Insufficient documentation

## 2019-10-25 DIAGNOSIS — I5022 Chronic systolic (congestive) heart failure: Secondary | ICD-10-CM | POA: Diagnosis not present

## 2019-10-25 DIAGNOSIS — J45909 Unspecified asthma, uncomplicated: Secondary | ICD-10-CM | POA: Insufficient documentation

## 2019-10-25 DIAGNOSIS — G4733 Obstructive sleep apnea (adult) (pediatric): Secondary | ICD-10-CM | POA: Insufficient documentation

## 2019-10-25 DIAGNOSIS — H9193 Unspecified hearing loss, bilateral: Secondary | ICD-10-CM | POA: Insufficient documentation

## 2019-10-25 DIAGNOSIS — Z8249 Family history of ischemic heart disease and other diseases of the circulatory system: Secondary | ICD-10-CM | POA: Diagnosis not present

## 2019-10-25 DIAGNOSIS — Z6841 Body Mass Index (BMI) 40.0 and over, adult: Secondary | ICD-10-CM | POA: Insufficient documentation

## 2019-10-25 DIAGNOSIS — Z888 Allergy status to other drugs, medicaments and biological substances status: Secondary | ICD-10-CM | POA: Insufficient documentation

## 2019-10-25 DIAGNOSIS — Z79899 Other long term (current) drug therapy: Secondary | ICD-10-CM | POA: Insufficient documentation

## 2019-10-25 DIAGNOSIS — Z825 Family history of asthma and other chronic lower respiratory diseases: Secondary | ICD-10-CM | POA: Insufficient documentation

## 2019-10-25 DIAGNOSIS — I428 Other cardiomyopathies: Secondary | ICD-10-CM

## 2019-10-25 LAB — CUP PACEART REMOTE DEVICE CHECK
Battery Remaining Longevity: 120 mo
Battery Voltage: 3 V
Brady Statistic RV Percent Paced: 0 %
Date Time Interrogation Session: 20210208033526
HighPow Impedance: 75 Ohm
Implantable Lead Implant Date: 20180802
Implantable Lead Location: 753860
Implantable Pulse Generator Implant Date: 20180802
Lead Channel Impedance Value: 304 Ohm
Lead Channel Impedance Value: 418 Ohm
Lead Channel Pacing Threshold Amplitude: 0.75 V
Lead Channel Pacing Threshold Pulse Width: 0.4 ms
Lead Channel Sensing Intrinsic Amplitude: 9.25 mV
Lead Channel Sensing Intrinsic Amplitude: 9.25 mV
Lead Channel Setting Pacing Amplitude: 2.5 V
Lead Channel Setting Pacing Pulse Width: 0.4 ms
Lead Channel Setting Sensing Sensitivity: 0.3 mV

## 2019-10-25 NOTE — Patient Instructions (Signed)
Please follow up with the [facility] in 4 weeks.  At the [facility], you and your health needs are our priority. As part of our continuing mission to provide you with exceptional heart care, we have created designated Provider Care Teams. These Care Teams include your primary Cardiologist (physician) and Advanced Practice Providers (APPs- Physician Assistants and Nurse Practitioners) who all work together to provide you with the care you need, when you need it.   You may see any of the following providers on your designated Care Team at your next follow up: Marland Kitchen Dr Arvilla Meres . Dr Marca Ancona . Tonye Becket, NP . Robbie Lis, PA . Karle Plumber, PharmD   Please be sure to bring in all your medications bottles to every appointment.

## 2019-10-25 NOTE — Progress Notes (Signed)
Paramedicine Encounter   [name] , female,   DOB: April 10, 1981,38 y.o.,  MRN: [id number]   Met patient in clinic today with provider.  B/p-128/74 p-90 sp02-97 Weight @ clinic-344 Weight @ home scales-338  Pt here at clinic, she is feeling better, her n/v/d has passed now.  She had been sick for several days last week.  Weight at home the highest 338-345 depending on her fluid intake.  She reports weight loss clinic is considering surgery and are looking to see if [facility] will be willing to do it.  No med changes today.   [name], [place] 10/25/2019

## 2019-10-25 NOTE — Addendum Note (Signed)
Encounter addended by: Burna Sis, LCSW on: 10/25/2019 2:33 PM  Actions taken: Clinical Note Signed

## 2019-10-25 NOTE — Progress Notes (Signed)
[facility] Note   Referring Physician:PCP: System, Provider Not In PCP-Cardiologist: Dr. 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   She presented to the ER on 06/17/19 with several days of worsening HF with SOB at rest, marked edema and orthopnea/PND, not responding to increased doses of oral diuretics. She admitted to high levels of water and Gatorade intake.   Her admit wt was 380 lb (Dry wt ~360 lb). She was started on IV Lasix but had initial poor urinary response. PO metolazone added as adjunct. Repeat echo was obtained and showed EF <20%, relatively normal RV, moderate MR. RHC 10/5 with low output (CI 1.8) and elevated PCWP but near normal RA pressure. She was started on inotropic therapy w/ milrinone for low output, 0.25 mcg/kg/min. Also treated w/ Entresto, Coreg, spironolactone, digoxin and ivabradine. She was later transitioned off of IV Lasix to po torsemide 80 mg qam/60 mg qpm.   Unfortunately, she was not a transplant candidate due to her morbid obesity. We discussed her at [facility] and Dr. Donata Clay has seen. She will need significant weight loss to get to LVAD. Will use home milrinone to try to facilitate increased activity to work towards weight loss. She understands that milrinone is not a good end-point and that we would like to use it as a bridge to eventual LVAD. RV function seems adequate for LVAD. Pt is motivated to lose weight. Tunnel cath was placed for home milrinone and home health was arranged. She was discharged home on 10/9 on milrinone 0.25 mcg.   06/27/19 she [facility] to [facility] w/ CC of palpitations. Was brought in by EMS and was reportedly in SVT with HR in the 180s. She was given adenosine and converted to NSR prior to arrival to the ED. In the ED she was stable and symptoms had resolved. Labs showed normal CBC. BMP  showed hypokalemia (3.0), hyperglycemia (243), mildly elevated BUN/SCr, and low Mag, 1.5. She was given K and Mg supplementation and discharged home from the ED.  PICC exchanged 10/15/19  Today she returns for HF follow up.Overall feeling ok. Says she is getting over a viral illness. Denies SOB/PND/Orthopnea.Able to walk up the steps. Appetite ok. No fever or chills. Weight at home trending down 340-->335 pounds.  Taking all medications. Followed by [facility] and HF Paramedicine.   RHC Procedural Findings (10/5): Hemodynamics (mmHg) RA mean 7 RV 49/10 PA 65/25 mean 45 PCWP mean 27 Oxygen saturations: PA 57% AO 100% Cardiac Output (Fick) 4.65  Cardiac Index (Fick) 1.82 PVR 3.87 WU PAPI 5.7  2D Echo 06/18/19  Left ventricular ejection fraction, by visual estimation, is <20%. The left ventricle has severely decreased function. Severely increased left ventricular size. There is no left ventricular hypertrophy. Severe global hypokinesis. 2. Left ventricular diastolic Doppler parameters are indeterminate pattern of LV diastolic filling. 3. Global right ventricle has low normal systolic function.The right ventricular size is normal. No increase in right ventricular wall thickness. 4. Left atrial size was severely dilated. 5. Right atrial size was mildly dilated. 6. The mitral valve is abnormal. Moderate mitral valve regurgitation. 7. The tricuspid valve is grossly normal. Tricuspid valve regurgitation is trivial. 8. The aortic valve is tricuspid Aortic valve regurgitation was not visualized by color flow Doppler. 9. The pulmonic valve was grossly normal. Pulmonic valve regurgitation is not visualized by color flow  Doppler. 10. Mildly elevated pulmonary artery systolic pressure. 11. A pacer wire is visualized. 12. The inferior vena cava is dilated in size with >50% respiratory variability, suggesting right atrial pressure of 8 mmHg. 13. The interatrial septum was not well  visualized.+   Past Medical History:  Diagnosis Date  . Asthma   . Chronic systolic CHF (congestive heart failure) (HCC)    a. ECHO 01/15/2016 EF 10-15%. 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  . Diabetes mellitus (HCC) 06/17/2019  . Hearing loss    bilateral  . Mitral regurgitation    a. severe, felt to be functional 2/2 LV dilation   . Morbid obesity (HCC)   . Snoring     Current Outpatient Medications  Medication Sig Dispense Refill  . albuterol (PROAIR HFA) 108 (90 Base) MCG/ACT inhaler Inhale 2 puffs into the lungs every 6 (six) hours as needed for wheezing or shortness of breath.    Marland Kitchen atorvastatin (LIPITOR) 10 MG tablet Take 1 tablet (10 mg total) by mouth daily. 90 tablet 3  . carvedilol (COREG) 3.125 MG tablet Take 1 tablet (3.125 mg total) by mouth 2 (two) times daily with a meal. 60 tablet 5  . digoxin (LANOXIN) 0.125 MG tablet Take 1 tablet (0.125 mg total) by mouth daily. 90 tablet 3  . ipratropium-albuterol (DUONEB) 0.5-2.5 (3) MG/3ML SOLN Take 3 mLs by nebulization every 4 (four) hours as needed. 360 mL 0  . ivabradine (CORLANOR) 7.5 MG TABS tablet Take 1 tablet (7.5 mg total) by mouth 2 (two) times daily with a meal. 60 tablet 3  . Magnesium Oxide 400 (240 Mg) MG TABS Take 1 tablet (400 mg total) by mouth 2 (two) times daily. 60 tablet 6  . metolazone (ZAROXOLYN) 2.5 MG tablet TAKE 1 TABLET(2.5 MG) BY MOUTH 1 TIME A WEEK 10 tablet 3  . milrinone (PRIMACOR) 20 MG/100 ML SOLN infusion Inject 0.0414 mg/min into the vein continuous.    . ondansetron (ZOFRAN) 4 MG tablet Take 1 tablet (4 mg total) by mouth every 8 (eight) hours as needed for nausea or vomiting. 20 tablet 0  . potassium chloride SA (KLOR-CON) 20 MEQ tablet Take 3 tablets (60 mEq total) by mouth 3 (three) times daily. Take an additional with metolazone. 280 tablet 3  . PRESCRIPTION MEDICATION CPAP- AT BEDTIME    . sacubitril-valsartan (ENTRESTO) 24-26 MG Take 1 tablet by mouth 2 (two) times daily. 60  tablet 5  . spironolactone (ALDACTONE) 25 MG tablet Take 1 tablet (25 mg total) by mouth daily. 90 tablet 3  . torsemide (DEMADEX) 20 MG tablet Take 4 tablets (80 mg total) by mouth 2 (two) times daily. 720 tablet 3   No current facility-administered medications for this encounter.    Allergies  Allergen Reactions  . Metformin And Related Diarrhea    And caused severe "dry mouth," also      Social History   Socioeconomic History  . Marital status: Single    Spouse name: Not on file  . Number of children: 2  . Years of education: Not on file  . Highest education level: Not on file  Occupational History  . Not on file  Tobacco Use  . Smoking status: Former Smoker    Quit date: 10/19/2015    Years since quitting: 4.0  . Smokeless tobacco: Never Used  Substance and Sexual Activity  . Alcohol use: Yes    Alcohol/week: 0.0 standard drinks    Comment: occasionally  . Drug  use: Yes    Types: Marijuana    Comment: only socially  . Sexual activity: Yes  Other Topics Concern  . Not on file  Social History Narrative  . Not on file   Social Determinants of Health   Financial Resource Strain:   . Difficulty of Paying Living Expenses: Not on file  Food Insecurity:   . Worried About Charity fundraiser in the Last Year: Not on file  . Ran Out of Food in the Last Year: Not on file  Transportation Needs:   . Lack of Transportation (Medical): Not on file  . Lack of Transportation (Non-Medical): Not on file  Physical Activity:   . Days of Exercise per Week: Not on file  . Minutes of Exercise per Session: Not on file  Stress:   . Feeling of Stress : Not on file  Social Connections:   . Frequency of Communication with Friends and Family: Not on file  . Frequency of Social Gatherings with Friends and Family: Not on file  . Attends Religious Services: Not on file  . Active Member of Clubs or Organizations: Not on file  . Attends Archivist Meetings: Not on file  .  Marital Status: Not on file  Intimate Partner Violence:   . Fear of Current or Ex-Partner: Not on file  . Emotionally Abused: Not on file  . Physically Abused: Not on file  . Sexually Abused: Not on file      Family History  Problem Relation Age of Onset  . Heart attack Mother   . Asthma Mother   . Heart failure Mother   . Hypertension Father   . Diabetes Brother     Vitals:   10/25/19 0919  BP: 128/74  Pulse: 90  SpO2: 97%  Weight: (!) 156.4 kg (344 lb 12.8 oz)   Wt Readings from Last 3 Encounters:  10/25/19 (!) 156.4 kg (344 lb 12.8 oz)  10/14/19 (!) 154.7 kg (341 lb)  09/27/19 (!) 157.5 kg (347 lb 4.8 oz)    PHYSICAL EXAM General:  Well appearing. No resp difficulty HEENT: normal Neck: supple. no JVD. Carotids 2+ bilat; no bruits. No lymphadenopathy or thryomegaly appreciated. Cor: PMI nondisplaced. Regular rate & rhythm. No rubs, gallops or murmurs. R upper chest tunneled picc dressing  Lungs: clear Abdomen: obese, soft, nontender, nondistended. No hepatosplenomegaly. No bruits or masses. Good bowel sounds. Extremities: no cyanosis, clubbing, rash, edema Neuro: alert & orientedx3, cranial nerves grossly intact. moves all 4 extremities w/o difficulty. Affect pleasant  ASSESSMENT & PLAN: 1. Chronic Systolic HF w/ Low Output/ NICM: Echo 06/2019 with EF < 20%, relatively normal RV, moderate MR. NICM with Medtronic ICD. Recent acute exacerbation in the setting of dietary indiscretion w/ sodium. Massive diuresis w/ IV Lasix, ~17 lb. D/c dry wt 357 lb.  RHC 10/5 with low output (CI 1.8) and elevated PCWP but near normal RA pressure, started on milrinone 0.25 w/ plans to continue home milrinone as bridge to possible LVAD if significant weight loss.  - NYHA II. Volume status stable.  Continue torsemide 80 mg twice a day.  -  Continue Coreg 3.125 mg bid.  - Continue Entresto 24/26 bid.  - Continue ivabradine to 7.5 mg twice a day.   - Continue spironolactone 25 daily.  -  Continue digoxin 0.125 mg - VAD consideration: Her size and social support will be issues.  She would not be a transplant candidate at this point due to weight. We discussed her  at [facility], Dr. Donata Clay has seen.  She will need significant weight loss to get to LVAD.  Will use home milrinone to try to facilitate increased activity to work towards weight loss.  She understands that milrinone is not a good end-point and that we would like to use it as a bridge to eventual LVAD. RV function seems adequate for LVAD.   2. Morbid obesity:  Body mass index is 57.38 kg/m.  [facility] Doing well.   3. SVT: had ED visit 10/11 for SVT noted by EMS that converted back to NSR after dose of adenosine. This was in the setting of hypokalemia and hypomagnesemia, at 3.0 and 1.5 respectively. Also in the setting of inotropic therapy w/ milrinone.   -No further episodes.   4. OSA Continue CPAP.   5. DMII Per PCP. 06/17/19 Hgb A1C 9.4  - Consider SGT2I with weight loss.   Follow up in 4 weeks. Continue HH and HF Paramedicine. Reinforced to let us know if PICC is red, with drainage or fever.    Tonye Becket, NP 10/25/19

## 2019-10-25 NOTE — Progress Notes (Signed)
CSW present for clinic visit today.  Patient is doing well today and finally starting to feel better after her stomach bug from last week.  Pt down a few more pounds from last visit but has not been able to exercise for a week due to her stomach concerns.  Will see weight-loss clinic today for exercise session and plans to try to start working out again this week.  Pt also inquired with CSW regarding getting medical clearance for a dental procedure to replace her missing teeth- CSW explained that dentist office would need to send note detailing what needs to be done and then we would send back approval- pt expressed understanding.  CSW will continue to follow and assist as needed  Burna Sis, LCSW Clinical Social Worker [facility] Desk#: [phone] Cell#: [phone]

## 2019-10-26 DIAGNOSIS — Z79899 Other long term (current) drug therapy: Secondary | ICD-10-CM | POA: Diagnosis not present

## 2019-10-26 DIAGNOSIS — I340 Nonrheumatic mitral (valve) insufficiency: Secondary | ICD-10-CM | POA: Diagnosis not present

## 2019-10-26 DIAGNOSIS — Z6841 Body Mass Index (BMI) 40.0 and over, adult: Secondary | ICD-10-CM | POA: Diagnosis not present

## 2019-10-26 DIAGNOSIS — Z5181 Encounter for therapeutic drug level monitoring: Secondary | ICD-10-CM | POA: Diagnosis not present

## 2019-10-26 DIAGNOSIS — E119 Type 2 diabetes mellitus without complications: Secondary | ICD-10-CM | POA: Diagnosis not present

## 2019-10-26 DIAGNOSIS — I5023 Acute on chronic systolic (congestive) heart failure: Secondary | ICD-10-CM | POA: Diagnosis not present

## 2019-10-26 DIAGNOSIS — O903 Peripartum cardiomyopathy: Secondary | ICD-10-CM | POA: Diagnosis not present

## 2019-10-26 DIAGNOSIS — Z452 Encounter for adjustment and management of vascular access device: Secondary | ICD-10-CM | POA: Diagnosis not present

## 2019-10-26 DIAGNOSIS — Z7984 Long term (current) use of oral hypoglycemic drugs: Secondary | ICD-10-CM | POA: Diagnosis not present

## 2019-10-26 DIAGNOSIS — F17210 Nicotine dependence, cigarettes, uncomplicated: Secondary | ICD-10-CM | POA: Diagnosis not present

## 2019-10-26 DIAGNOSIS — J45909 Unspecified asthma, uncomplicated: Secondary | ICD-10-CM | POA: Diagnosis not present

## 2019-10-26 NOTE — Telephone Encounter (Signed)
lmtcb

## 2019-10-26 NOTE — Progress Notes (Signed)
ICD Remote  

## 2019-10-27 ENCOUNTER — Telehealth: Payer: Self-pay

## 2019-10-27 MED ORDER — CARVEDILOL 6.25 MG PO TABS: 6.2500 mg | ORAL_TABLET | Freq: Two times a day (BID) | ORAL | 3 refills | Status: DC

## 2019-10-27 NOTE — Telephone Encounter (Signed)
Pt verbalized understanding of her device interrogation per Dr. Elberta Fortis and will increase her coreg and keep her HF OV 11/22/19.

## 2019-10-27 NOTE — Telephone Encounter (Signed)
-----   Message from Will Jorja Loa, MD sent at 10/25/2019 12:05 PM EST ----- Abnormal device interrogation reviewed.  Lead parameters and battery status stable.  Short NSVT. Increase coreg to 6.25 mg BID.

## 2019-10-28 ENCOUNTER — Telehealth (HOSPITAL_COMMUNITY): Payer: Self-pay | Admitting: *Deleted

## 2019-10-28 NOTE — Telephone Encounter (Signed)
Week in review: - Todays weight: 343lbs.  Micah Flesher for [facility] Exercise appt on Monday, they rescheduled since her stomach was still bothering her some.  - She continued her Shakes for meals and substituted her sugar free jello and applesauce with Activia to help restore her gut bacteria since her stomach bug - We were supposed to walk today and coach, however she had a conflict with a child's appt.  - Exercised some but was unable to meet her 5 days a week goal  o Workout video Tuesday and Wednesday (with neighbor)  Challenges: - She has been frustrated the last couple of days (starting Tuesday). She found herself sleeping a lot and not wanting to eat the protein shakes. She is feeling emotional and isolating herself.  - She identified her trigger for the emotional issues this week- running out of gas taking her daughter to school. She felt like she is not able to handle the needs of her children and that she is letting them down.  - She woke up today and did not want to get up. She found drive and motivation to get up and get going with the day because she needed to get her children to school. She knows they are her priority and she highly values them.  - We discussed how she overcame these feelings this week. She resorted to prayer immediately and found peace in that.  - She says she "snapped" out and started to move forward once she prayed.  - She wants to be the one helping and not having to be helped. We explored areas of when she has helped someone and how she felt offering her talents and abilities to them.   Accomplishments: - Taking care of her children - Turning down unhealthy foods when she is tempted (this gives her strength and power) - She likes the exercise and finds great accomplishment and pride in her work to complete those tasks and habits   Next week's plans: - Re-establish journaling starting this weekend - Re-start exercise planning and walking with staff   - Write down gratitude each day this weekend - Meet to walk with Karsyn Rochin on Monday 2/15 8:30am   Time spent with patient- Telephone Visit 35 mins     Lesia Hausen, MS, ACSM-RCEP Clinical Exercise Physiologist/ Health and Wellness Coach

## 2019-11-01 ENCOUNTER — Telehealth (HOSPITAL_COMMUNITY): Payer: Self-pay | Admitting: *Deleted
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
## 2019-11-02 DIAGNOSIS — Z7984 Long term (current) use of oral hypoglycemic drugs: Secondary | ICD-10-CM | POA: Diagnosis not present

## 2019-11-02 DIAGNOSIS — Z452 Encounter for adjustment and management of vascular access device: Secondary | ICD-10-CM | POA: Diagnosis not present

## 2019-11-02 DIAGNOSIS — Z79899 Other long term (current) drug therapy: Secondary | ICD-10-CM | POA: Diagnosis not present

## 2019-11-02 DIAGNOSIS — I340 Nonrheumatic mitral (valve) insufficiency: Secondary | ICD-10-CM | POA: Diagnosis not present

## 2019-11-02 DIAGNOSIS — O903 Peripartum cardiomyopathy: Secondary | ICD-10-CM | POA: Diagnosis not present

## 2019-11-02 DIAGNOSIS — Z6841 Body Mass Index (BMI) 40.0 and over, adult: Secondary | ICD-10-CM | POA: Diagnosis not present

## 2019-11-02 DIAGNOSIS — F17210 Nicotine dependence, cigarettes, uncomplicated: Secondary | ICD-10-CM | POA: Diagnosis not present

## 2019-11-02 DIAGNOSIS — I509 Heart failure, unspecified: Secondary | ICD-10-CM | POA: Diagnosis not present

## 2019-11-02 DIAGNOSIS — Z5181 Encounter for therapeutic drug level monitoring: Secondary | ICD-10-CM | POA: Diagnosis not present

## 2019-11-02 DIAGNOSIS — E119 Type 2 diabetes mellitus without complications: Secondary | ICD-10-CM | POA: Diagnosis not present

## 2019-11-02 DIAGNOSIS — J45909 Unspecified asthma, uncomplicated: Secondary | ICD-10-CM | POA: Diagnosis not present

## 2019-11-02 DIAGNOSIS — I5023 Acute on chronic systolic (congestive) heart failure: Secondary | ICD-10-CM | POA: Diagnosis not present

## 2019-11-02 DIAGNOSIS — O909 Complication of the puerperium, unspecified: Secondary | ICD-10-CM | POA: Diagnosis not present

## 2019-11-03 ENCOUNTER — Telehealth (HOSPITAL_COMMUNITY): Payer: Self-pay | Admitting: *Deleted

## 2019-11-03 ENCOUNTER — Other Ambulatory Visit (HOSPITAL_COMMUNITY): Payer: Self-pay | Admitting: Adult Health

## 2019-11-03 ENCOUNTER — Other Ambulatory Visit (HOSPITAL_COMMUNITY): Payer: Self-pay
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
## 2019-11-09 DIAGNOSIS — E119 Type 2 diabetes mellitus without complications: Secondary | ICD-10-CM | POA: Diagnosis not present

## 2019-11-09 DIAGNOSIS — F17210 Nicotine dependence, cigarettes, uncomplicated: Secondary | ICD-10-CM | POA: Diagnosis not present

## 2019-11-09 DIAGNOSIS — Z7984 Long term (current) use of oral hypoglycemic drugs: Secondary | ICD-10-CM | POA: Diagnosis not present

## 2019-11-09 DIAGNOSIS — Z5181 Encounter for therapeutic drug level monitoring: Secondary | ICD-10-CM | POA: Diagnosis not present

## 2019-11-09 DIAGNOSIS — O903 Peripartum cardiomyopathy: Secondary | ICD-10-CM | POA: Diagnosis not present

## 2019-11-09 DIAGNOSIS — J45909 Unspecified asthma, uncomplicated: Secondary | ICD-10-CM | POA: Diagnosis not present

## 2019-11-09 DIAGNOSIS — Z6841 Body Mass Index (BMI) 40.0 and over, adult: Secondary | ICD-10-CM | POA: Diagnosis not present

## 2019-11-09 DIAGNOSIS — Z452 Encounter for adjustment and management of vascular access device: Secondary | ICD-10-CM | POA: Diagnosis not present

## 2019-11-09 DIAGNOSIS — Z79899 Other long term (current) drug therapy: Secondary | ICD-10-CM | POA: Diagnosis not present

## 2019-11-09 DIAGNOSIS — I340 Nonrheumatic mitral (valve) insufficiency: Secondary | ICD-10-CM | POA: Diagnosis not present

## 2019-11-09 DIAGNOSIS — I5023 Acute on chronic systolic (congestive) heart failure: Secondary | ICD-10-CM | POA: Diagnosis not present

## 2019-11-10 ENCOUNTER — Telehealth (HOSPITAL_COMMUNITY): Payer: Self-pay | Admitting: *Deleted

## 2019-11-10 NOTE — Telephone Encounter (Signed)
Attempted to call patient Monday after a no show for Monday's (8am) scheduled walking/coaching session per our last coaching meeting 2/17. No answer, left message for patient to return call. I checked in with CSW, Eileen Stanford in regards to the patient with an eventual response that the patient would call to talk later that afternoon (Monday). No response from the patient so today I attempted to call her again to follow-up and check in. No answer, left message for patient to return call to discuss current challenges and re evaluate her coaching schedule.    Lesia Hausen, MS, ACSM-RCEP Clinical Exercise Physiologist/ Health and Wellness Coach

## 2019-11-11 ENCOUNTER — Other Ambulatory Visit (HOSPITAL_COMMUNITY): Payer: Self-pay

## 2019-11-11 ENCOUNTER — Telehealth (HOSPITAL_COMMUNITY): Payer: Self-pay | Admitting: Licensed Clinical Social Worker

## 2019-11-11 DIAGNOSIS — J45909 Unspecified asthma, uncomplicated: Secondary | ICD-10-CM | POA: Diagnosis not present

## 2019-11-11 DIAGNOSIS — Z5181 Encounter for therapeutic drug level monitoring: Secondary | ICD-10-CM | POA: Diagnosis not present

## 2019-11-11 DIAGNOSIS — Z6841 Body Mass Index (BMI) 40.0 and over, adult: Secondary | ICD-10-CM | POA: Diagnosis not present

## 2019-11-11 DIAGNOSIS — O903 Peripartum cardiomyopathy: Secondary | ICD-10-CM | POA: Diagnosis not present

## 2019-11-11 DIAGNOSIS — Z79899 Other long term (current) drug therapy: Secondary | ICD-10-CM | POA: Diagnosis not present

## 2019-11-11 DIAGNOSIS — F17210 Nicotine dependence, cigarettes, uncomplicated: Secondary | ICD-10-CM | POA: Diagnosis not present

## 2019-11-11 DIAGNOSIS — Z452 Encounter for adjustment and management of vascular access device: Secondary | ICD-10-CM | POA: Diagnosis not present

## 2019-11-11 DIAGNOSIS — E119 Type 2 diabetes mellitus without complications: Secondary | ICD-10-CM | POA: Diagnosis not present

## 2019-11-11 DIAGNOSIS — I5023 Acute on chronic systolic (congestive) heart failure: Secondary | ICD-10-CM | POA: Diagnosis not present

## 2019-11-11 DIAGNOSIS — Z7984 Long term (current) use of oral hypoglycemic drugs: Secondary | ICD-10-CM | POA: Diagnosis not present

## 2019-11-11 DIAGNOSIS — I340 Nonrheumatic mitral (valve) insufficiency: Secondary | ICD-10-CM | POA: Diagnosis not present

## 2019-11-11 NOTE — Telephone Encounter (Signed)
CSW met with patient at 10am to have walking meeting.  Pt and CSW walked about 62mles with one short rest break.  Pt feels like she is doing ok but has had a lot of stressors lately that have made it hard to focus on her health- reports continuing to work out at home doing exercises recommended by her weight loss clinic.  Pt reports feeling down lately and having trouble getting out of bed- reports she had therapy session yesterday and feels somewhat better today- attributes a lot of this depression to her step moms health and feeling like she is somehow responsible though she verbalizes she knows this isn't realistic.  CSW provided active listening and support.  CSW will continue to follow and assist as needed.  JJorge Ny LCSW Clinical Social Worker [facility] Desk#: [phone]#: [phone]

## 2019-11-11 NOTE — Progress Notes (Signed)
Met pt in clinic for her walk with Eliezer Lofts, there was some confusion about the time so she just came to clinic and I filled pill box in lobby area before her and jenna did the walk.   Weight @ home-335 with clothes  Last week-332  Need atorvastatin, carvedilol need the 6.8m at pharmacy [facility],  Metolazone-missed yesterday-will take today when she gets home from walking She states she didn't take it b/c she didn't feel like she needed it. I told her it was to prevent her from feeling that she needed it so to be sure to take it weekly.   Has 5 days of meds in pill box  She is out of the atorvastatin in pill box. She is short 2 doses of potassium, she has more at home and will fill where needed.  In one AM dose it was 5 potassium pills in there. She had not taken 3 days worth from pill box, she reports she got off sch with her atorvastatin being out and the she just started taking them out of the bottles.  Will see her next Tuesday.   KMarylouise Stacks EMT-Paramedic 11/11/19

## 2019-11-12 ENCOUNTER — Telehealth (HOSPITAL_COMMUNITY): Payer: Self-pay

## 2019-11-12 ENCOUNTER — Telehealth (HOSPITAL_COMMUNITY): Payer: Self-pay | Admitting: *Deleted

## 2019-11-12 NOTE — Telephone Encounter (Signed)
Lab work faxed over: drawn 11/11/19  K 3.0 Cl 89 Bun 25 Cr 1.15 Gluc 314  Pt Kirsten Clegg NP-C Pt to take extra 40 meq today and resume regular dose tomorrow.
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
## 2019-11-15 ENCOUNTER — Encounter (HOSPITAL_COMMUNITY): Payer: Self-pay | Admitting: *Deleted

## 2019-11-15 NOTE — Progress Notes (Signed)
Patient arrived this morning for her "scheduled" visit with Clinical Exercise Physiologist/Health Coach to work on her exercise. Weather permitting, patient and myself (CPE/HWC) will walk outside (Heart walk) for 45 mins to an hour. Today due to weather, we walked a mix of the treadmill and used the cycle ergometer in the exercise lab in the Heart Failure clinic.  With intermittent breaks todays exercises included: Treadmill: 20 mins (1.87mph with 2.5% grade)  Cycle ergometer: 20 mins @30watts  Treadmill: 15 mins (1.7pmh 0% grade) Cool down: 5-7 mins (slow walk with stretching)   Today she arrived with some degree of "feeling blue" however, soon after exercise, she began to smile and laugh and even stated that she felt much happier by the end of exercise today. She was advised to have breakfast as soon as she returned home. She will return to the [facility] center tomorrow for another exercise, at 8:30 am.   Total time with patient today: 75 mins     [facility], MS, ACSM-RCEP Clinical Exercise Physiologist/ Health and Wellness Coach

## 2019-11-16 ENCOUNTER — Ambulatory Visit (INDEPENDENT_AMBULATORY_CARE_PROVIDER_SITE_OTHER): Payer: Medicaid Other | Admitting: Family Medicine

## 2019-11-16 ENCOUNTER — Encounter (HOSPITAL_COMMUNITY): Payer: Self-pay | Admitting: *Deleted

## 2019-11-16 DIAGNOSIS — I340 Nonrheumatic mitral (valve) insufficiency: Secondary | ICD-10-CM | POA: Diagnosis not present

## 2019-11-16 DIAGNOSIS — Z452 Encounter for adjustment and management of vascular access device: Secondary | ICD-10-CM | POA: Diagnosis not present

## 2019-11-16 DIAGNOSIS — I5023 Acute on chronic systolic (congestive) heart failure: Secondary | ICD-10-CM | POA: Diagnosis not present

## 2019-11-16 DIAGNOSIS — Z79899 Other long term (current) drug therapy: Secondary | ICD-10-CM | POA: Diagnosis not present

## 2019-11-16 DIAGNOSIS — Z9989 Dependence on other enabling machines and devices: Secondary | ICD-10-CM | POA: Diagnosis not present

## 2019-11-16 DIAGNOSIS — E119 Type 2 diabetes mellitus without complications: Secondary | ICD-10-CM | POA: Diagnosis not present

## 2019-11-16 DIAGNOSIS — I5084 End stage heart failure: Secondary | ICD-10-CM | POA: Diagnosis not present

## 2019-11-16 DIAGNOSIS — Z7984 Long term (current) use of oral hypoglycemic drugs: Secondary | ICD-10-CM | POA: Diagnosis not present

## 2019-11-16 DIAGNOSIS — Z6841 Body Mass Index (BMI) 40.0 and over, adult: Secondary | ICD-10-CM | POA: Diagnosis not present

## 2019-11-16 DIAGNOSIS — I10 Essential (primary) hypertension: Secondary | ICD-10-CM | POA: Diagnosis not present

## 2019-11-16 DIAGNOSIS — G4733 Obstructive sleep apnea (adult) (pediatric): Secondary | ICD-10-CM | POA: Diagnosis not present

## 2019-11-16 DIAGNOSIS — J45909 Unspecified asthma, uncomplicated: Secondary | ICD-10-CM | POA: Diagnosis not present

## 2019-11-16 DIAGNOSIS — F17210 Nicotine dependence, cigarettes, uncomplicated: Secondary | ICD-10-CM | POA: Diagnosis not present

## 2019-11-16 DIAGNOSIS — E669 Obesity, unspecified: Secondary | ICD-10-CM | POA: Diagnosis not present

## 2019-11-16 DIAGNOSIS — Z5181 Encounter for therapeutic drug level monitoring: Secondary | ICD-10-CM | POA: Diagnosis not present

## 2019-11-16 DIAGNOSIS — O903 Peripartum cardiomyopathy: Secondary | ICD-10-CM | POA: Diagnosis not present

## 2019-11-16 NOTE — Progress Notes (Signed)
Kirsten Wheeler arrived for her exercise and walking session today at 1:15pm. She arrived emotional and feeling overwhelmed as she had a follow-up with her weightloss doctor who told her she was "running out of time" in front of her daughter, which also made her daughter emotional. She had a therapy session prior to meeting with me. She said she was looking forward to walking off her emotions and clearing her mind. That kept her motivated for showing up. She shared some of her feelings, thoughts and worries throughout the walk. By the end of the walk she was feeling uplifted, refreshed and her vision shifted.   Today she walked 2.7 miles in an hour and 5 minutes. I walked her through easy and simple stretches to use after her exercises. I also weighed her on the scales in clinic prior to her walk (249.6lbs)- she says she did not weigh at home today.   She plans to walk with her daughter tomorrow and to meet back here Thursday morning for another walk. We will review a coaching session then.    Kirsten Hausen, MS, ACSM-RCEP Clinical Exercise Physiologist/ Health and Wellness Coach

## 2019-11-17 ENCOUNTER — Telehealth (HOSPITAL_COMMUNITY): Payer: Self-pay

## 2019-11-17 ENCOUNTER — Other Ambulatory Visit (HOSPITAL_COMMUNITY): Payer: Self-pay

## 2019-11-17 ENCOUNTER — Telehealth (HOSPITAL_COMMUNITY): Payer: Self-pay | Admitting: Licensed Clinical Social Worker

## 2019-11-17 ENCOUNTER — Encounter (HOSPITAL_COMMUNITY): Payer: Medicaid Other | Admitting: Cardiology

## 2019-11-17 DIAGNOSIS — Z6841 Body Mass Index (BMI) 40.0 and over, adult: Secondary | ICD-10-CM | POA: Diagnosis not present

## 2019-11-17 DIAGNOSIS — I5023 Acute on chronic systolic (congestive) heart failure: Secondary | ICD-10-CM | POA: Diagnosis not present

## 2019-11-17 DIAGNOSIS — O903 Peripartum cardiomyopathy: Secondary | ICD-10-CM | POA: Diagnosis not present

## 2019-11-17 DIAGNOSIS — Z79899 Other long term (current) drug therapy: Secondary | ICD-10-CM | POA: Diagnosis not present

## 2019-11-17 DIAGNOSIS — J45909 Unspecified asthma, uncomplicated: Secondary | ICD-10-CM | POA: Diagnosis not present

## 2019-11-17 DIAGNOSIS — Z5181 Encounter for therapeutic drug level monitoring: Secondary | ICD-10-CM | POA: Diagnosis not present

## 2019-11-17 DIAGNOSIS — Z452 Encounter for adjustment and management of vascular access device: Secondary | ICD-10-CM | POA: Diagnosis not present

## 2019-11-17 DIAGNOSIS — Z7984 Long term (current) use of oral hypoglycemic drugs: Secondary | ICD-10-CM | POA: Diagnosis not present

## 2019-11-17 DIAGNOSIS — F17210 Nicotine dependence, cigarettes, uncomplicated: Secondary | ICD-10-CM | POA: Diagnosis not present

## 2019-11-17 DIAGNOSIS — E119 Type 2 diabetes mellitus without complications: Secondary | ICD-10-CM | POA: Diagnosis not present

## 2019-11-17 DIAGNOSIS — I340 Nonrheumatic mitral (valve) insufficiency: Secondary | ICD-10-CM | POA: Diagnosis not present

## 2019-11-17 NOTE — Progress Notes (Signed)
Paramedicine Encounter    [name], female    DOB: 04/15/1981, 39 y.o.   MRN: [id number]   Patient Care Team: System, Provider Not In as PCP - General Regan Lemming, MD as PCP - Electrophysiology (Cardiology) Laurey Morale, MD as PCP - Advanced Heart Failure (Cardiology)  Patient Active Problem List   Diagnosis Date Noted  . Acute on chronic systolic CHF (congestive heart failure) (HCC) 06/17/2019  . Obstructive sleep apnea 06/17/2019  . 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 Diabetes mellitus (HCC) 06/17/2019  . Amenorrhea 08/03/2018  . Congestive heart failure (HCC) 06/29/2018  . Hypotension 06/07/2018  . Rhinovirus infection 06/06/2018  . Asthma 06/05/2018  . Acute respiratory failure with hypoxia (HCC) 06/05/2018  . Hypokalemia 06/05/2018  . Snoring 09/23/2016  . Cough 04/10/2016  . 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 Tobacco abuse 01/25/2016  . Acute on chronic combined systolic and diastolic CHF (congestive heart failure) (HCC) 01/25/2016  . Mitral regurgitation   . Abnormal EKG-inf TWI 01/15/2016  . Positive D dimer-CTA neg for PE 01/15/2016  . Morbid obesity- BMI 57 01/14/2016  . Abdominal pain 01/14/2016  . 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 At risk for sleep apnea 01/14/2016  . Cardiomyopathy (HCC) 11/01/2015  . Morbid obesity with BMI of 50.0-59.9, adult (HCC) 08/16/2015    Current Outpatient Medications:  .  albuterol (PROAIR HFA) 108 (90 Base) MCG/ACT inhaler, Inhale 2 puffs into the lungs every 6 (six) hours as needed for wheezing or shortness of breath., Disp: , Rfl:  .  atorvastatin (LIPITOR) 10 MG tablet, Take 1 tablet (10 mg total) by mouth daily., Disp: 90 tablet, Rfl: 3 .  carvedilol (COREG) 6.25 MG tablet, Take 1 tablet (6.25 mg total) by mouth 2 (two) times daily., Disp: 180 tablet, Rfl: 3 .  digoxin (LANOXIN) 0.125 MG tablet, Take 1 tablet (0.125 mg total) by mouth daily.,  Disp: 90 tablet, Rfl: 3 .  ipratropium-albuterol (DUONEB) 0.5-2.5 (3) MG/3ML SOLN, Take 3 mLs by nebulization every 4 (four) hours as needed. (Patient not taking: Reported on 11/03/2019), Disp: 360 mL, Rfl: 0 .  ivabradine (CORLANOR) 7.5 MG TABS tablet, Take 1 tablet (7.5 mg total) by mouth 2 (two) times daily with a meal., Disp: 60 tablet, Rfl: 3 .  Magnesium Oxide 400 (240 Mg) MG TABS, Take 1 tablet (400 mg total) by mouth 2 (two) times daily., Disp: 60 tablet, Rfl: 6 .  metolazone (ZAROXOLYN) 2.5 MG tablet, TAKE 1 TABLET(2.5 MG) BY MOUTH 1 TIME A WEEK, Disp: 10 tablet, Rfl: 3 .  milrinone (PRIMACOR) 20 MG/100 ML SOLN infusion, Inject 0.0414 mg/min into the vein continuous., Disp:  , Rfl:  .  ondansetron (ZOFRAN) 4 MG tablet, Take 1 tablet (4 mg total) by mouth every 8 (eight) hours as needed for nausea or vomiting. (Patient not taking: Reported on 11/03/2019), Disp: 20 tablet, Rfl: 0 .  potassium chloride SA (KLOR-CON) 20 MEQ tablet, Take 3 tablets (60 mEq total) by mouth 3 (three) times daily. Take an additional with metolazone., Disp: 280 tablet, Rfl: 3 .  PRESCRIPTION MEDICATION, CPAP- AT BEDTIME, Disp: , Rfl:  .  sacubitril-valsartan (ENTRESTO) 24-26 MG, Take 1 tablet by mouth 2 (two) times daily., Disp: 60 tablet, Rfl: 5 .  spironolactone (ALDACTONE) 25 MG tablet, Take 1 tablet (25 mg total) by mouth daily., Disp: 90 tablet, Rfl: 3 .  torsemide (DEMADEX) 20 MG tablet, Take 4 tablets (80 mg total) by mouth 2 (two) times  daily., Disp: 720 tablet, Rfl: 3 Allergies  Allergen Reactions  . Metformin And Related Diarrhea    And caused severe "dry mouth," also      Social History   Socioeconomic History  . Marital status: Single    Spouse name: Not on file  . Number of children: 2  . Years of education: Not on file  . Highest education level: Not on file  Occupational History  . Not on file  Tobacco Use  . Smoking status: Former Smoker    Quit date: 10/19/2015    Years since  quitting: 4.0  . Smokeless tobacco: Never Used  Substance and Sexual Activity  . Alcohol use: Yes    Alcohol/week: 0.0 standard drinks    Comment: occasionally  . Drug use: Yes    Types: Marijuana    Comment: only socially  . Sexual activity: Yes  Other Topics Concern  . Not on file  Social History Narrative  . Not on file   Social Determinants of Health   Financial Resource Strain:   . Difficulty of Paying Living Expenses: Not on file  Food Insecurity:   . Worried About Programme researcher, broadcasting/film/video in the Last Year: Not on file  . Ran Out of Food in the Last Year: Not on file  Transportation Needs:   . Lack of Transportation (Medical): Not on file  . Lack of Transportation (Non-Medical): Not on file  Physical Activity:   . Days of Exercise per Week: Not on file  . Minutes of Exercise per Session: Not on file  Stress:   . Feeling of Stress : Not on file  Social Connections:   . Frequency of Communication with Friends and Family: Not on file  . Frequency of Social Gatherings with Friends and Family: Not on file  . Attends Religious Services: Not on file  . Active Member of Clubs or Organizations: Not on file  . Attends Banker Meetings: Not on file  . Marital Status: Not on file  Intimate Partner Violence:   . Fear of Current or Ex-Partner: Not on file  . Emotionally Abused: Not on file  . Physically Abused: Not on file  . Sexually Abused: Not on file    Physical Exam      Future Appointments  Date Time Provider Department Center  11/22/2019 10:30 AM [facility] PA/NP [facility] None  12/27/2019 11:20 AM Laurey Morale, MD [facility] None  01/24/2020  7:45 AM [facility] DEVICE REMOTES [facility]  04/24/2020  7:45 AM [facility] DEVICE REMOTES [facility]  07/24/2020  7:45 AM [facility] DEVICE REMOTES [facility]    BP (!) 102/0   Pulse 88   Resp 18   SpO2 97%  Weight today-343 Weight yesterday-349 Last visit  weight-335  Pt reports feeling ok, she said yesterday was a bad emotional day but is doing better today.  Last week she said she did do the extra potassium for that day.  She reports last night she felt flutters, and felt weird-nurse did come yesterday to change her bag and she normally feels more tired when that happens. But not like last night "weird" She goes to clinic next Monday.  She left her pill box in her nieces car so that was not done today and her car was not here.  She states she went walking after her IV drip was changed and felt dizzy after the walk and kept feeling fluttering all night to where she was scared to go to  sleep.  She also reports she has been coughing and she spit up a large ball of mucous. Her weight is going up. Told her that could be contributing factor to the extra mucous. No edema to her legs.  She didn't take her metolazone until Saturday when her normal day is Wednesday.  I explained to her again the importance of taking it same day each week to stay on schedule.  She has company again at the house-she reports that's it her brother came to visit her. They left out today.  EKG done today, she is having PVC's but infrequently. Her picc line site has dried blood around it again, nurse came yesterday and noted dried blood--pt states it started on Saturday. She denied dropping the bag or any trauma to the area.  We tried calling the nurse twice and sending text with pic of site to see if it was same as yesterday--pt thought she had cleaned it up before redressing it.  I ended up sending pic of it to [place] so she could show nurse to see if that needs addressed today. With her recent picc line exchange a few wks ago with that drainage I am on high suspicion for any thing that is abnormal.  Nurse was finally reached and is  going to go out there now and address it.   [name], [facility] Paramedic  11/17/19

## 2019-11-17 NOTE — Telephone Encounter (Signed)
CSW informed by [employer] that pt with concern regarding her PICC line site and medical symptoms.  CSW discussed with MD who would like to see lab results- Home Health RN had taken labs yesterday and CSW confirmed with agency that they are still pending- they will fax to [place] once completed  CSW also requested RN from home health go back out to see patient and evaluate her PICC site- CSW spoke with pt who confirms she spoke with RN and they are coming out shortly.  CSW will continue to follow and assist as needed  Burna Sis, LCSW Clinical Social Worker [facility] Desk#: [phone] Cell#: [phone]

## 2019-11-17 NOTE — Telephone Encounter (Addendum)
received patients lab results from [facility]. Patient did have some abnormal lab results. Discussed lab results with Dr. Marca Ancona and he increased patients Potassium to 2 extra tablets(32meq) daily and for her to increase her mag. By one extra pill(240mg ).   Changes were dicussed with patient and med list was updated to reflect changes.

## 2019-11-18 ENCOUNTER — Other Ambulatory Visit (HOSPITAL_COMMUNITY): Payer: Self-pay

## 2019-11-18 ENCOUNTER — Encounter (HOSPITAL_COMMUNITY): Payer: Self-pay | Admitting: *Deleted

## 2019-11-18 ENCOUNTER — Other Ambulatory Visit (HOSPITAL_COMMUNITY): Payer: Self-pay | Admitting: Cardiology

## 2019-11-18 ENCOUNTER — Telehealth (HOSPITAL_COMMUNITY): Payer: Self-pay | Admitting: Licensed Clinical Social Worker

## 2019-11-18 MED ORDER — MAGNESIUM OXIDE -MG SUPPLEMENT 400 (240 MG) MG PO TABS: ORAL_TABLET | ORAL | 6 refills | Status: DC

## 2019-11-18 MED ORDER — POTASSIUM CHLORIDE CRYS ER 10 MEQ PO TBCR: 60.0000 meq | EXTENDED_RELEASE_TABLET | Freq: Three times a day (TID) | ORAL | 5 refills | Status: DC

## 2019-11-18 MED ORDER — POTASSIUM CHLORIDE CRYS ER 10 MEQ PO TBCR: EXTENDED_RELEASE_TABLET | ORAL | 5 refills | Status: DC

## 2019-11-18 NOTE — Telephone Encounter (Signed)
CSW informed of pt low potassium levels and patient admits to not always taking it as prescribed because of how big the pills are.  CSW discuss with pharmacy who suggest changing patient to pills so that they are a more normal size which would allow patient to swallow easier.  Patient is agreeable to this plan and believes this would increase compliance so CSW had clinic RN send in to pt pharmacy.  CSW also discussed pt noncompliance with metolazone with pharmacy.  Patient usually taking metolazone several days late every week or not at all because she forgets to take it 30 minutes before her other pills.  Pharmacist is agreeable to pt taking with all her other pills from now on so she is less likely to forget.  Community paramedic updated regarding these med changes.  CSW will continue to follow and assist as needed.  Burna Sis, LCSW Clinical Social Worker [facility] Desk#: [phone] Cell#: [phone]

## 2019-11-18 NOTE — Progress Notes (Signed)
Kirsten Wheeler arrived about 30 minutes late for her appointment with me to walk for her 3rd session this week. She arrived with a dreadful mood, however she said she was excited to walk and to stay on track. Yesterday she had some "flutters" and other concerns discussed with clinic staff and that has been adjusted. Patient was slightly concerned to "push" today given her flutters. We walked a slower and shorter pace for the duration of exercise on the heart walk today.   Accomplishments/positives: - She was able to clean her closet - She is following an LVAD page on facebook and has connected with a current LVAD patient that experienced a similar journey, one which required significant weight loss for LVAD candidacy. She found comfort in discussing and talking with someone that has experienced similarities.  - Today she said she is ready to pick back up and fight again, and she needs to find new strength in that. Challenges: - A friend that she is trying to "shake" from her life, due to the influences the friend has on her health and her family and the relationships.  - Says she feels slightly more forgetful and wonders if it is due to so much going on that she is having to keep track of. We discussed her planning/appointment reminding process.  - She is feeling discouraged as she feels she has tried so hard - Her scales seem to be much different than the scales in either weightloss clinic and the AHF clinic. Recently moved the scales to a different location in her house and they seem to be off even further now. (she was encouraged to move them back to the original location and to just monitor the direction of the weight, she will be weighed in clinic).  Recommendations/plans: - She wants to consume only fish for her meat source--this was her decision (she was advised to maintain a variety in her diet including lower sugar, fat and sodium).  - I encouraged her to eat raw fruits and vegetables instead of her  sugar free jello- for more of a balanced diet of solid foods.  - I also encouraged her to look into yogurt as a snack vs. the jello - She plans to walk with CSW [place] tomorrow at 10am and with her daughter Saturday.  - She asked about roller skating and if she could go with her daughters for exercise, I discouraged this as she has mentioned many falls skating the last time she went. I explained the risk of the picc line dislodging with a fall. She was agreeable.   Next walking/coaching session:  - Monday: 8:30am (Heart Walk at [facility]) - Tuesday: 8:30am (Heart Walk at [facility]) - Thursday 7:30am (Heart Walk at [facility])  Total time spent with patient (face to face): 50 mins     Lesia Hausen, MS, ACSM-RCEP Clinical Exercise Physiologist/ Health and Wellness Coach

## 2019-11-22 ENCOUNTER — Encounter (HOSPITAL_COMMUNITY): Payer: Medicaid Other

## 2019-11-22 ENCOUNTER — Telehealth (HOSPITAL_COMMUNITY): Payer: Self-pay | Admitting: Licensed Clinical Social Worker

## 2019-11-22 NOTE — Telephone Encounter (Signed)
CSW informed pt had to reschedule appt this morning as she had to take her child to urgent care.  Pt was supposed to get labs at this appt- this is partially needed to see if pt potassium has increased enough to be safe to restart metolazone.  Pt is planning on coming to walk with exercise physiologist tomorrow morning so had triage staff schedule her for labs following this walk.  CSW will continue to follow and assist as needed  Burna Sis, LCSW Clinical Social Worker [facility] Desk#: [phone] Cell#: [phone]

## 2019-11-23 ENCOUNTER — Other Ambulatory Visit (HOSPITAL_COMMUNITY): Payer: Self-pay

## 2019-11-23 ENCOUNTER — Encounter (HOSPITAL_COMMUNITY): Payer: Self-pay | Admitting: *Deleted

## 2019-11-23 ENCOUNTER — Ambulatory Visit (HOSPITAL_COMMUNITY)
Admission: RE | Admit: 2019-11-23 | Discharge: 2019-11-23 | Disposition: A | Discharge status: Home / Self Care | Payer: Medicaid Other | Source: Ambulatory Visit | Attending: Cardiology | Admitting: Cardiology

## 2019-11-23 ENCOUNTER — Other Ambulatory Visit: Payer: Self-pay

## 2019-11-23 DIAGNOSIS — I5022 Chronic systolic (congestive) heart failure: Secondary | ICD-10-CM | POA: Diagnosis not present

## 2019-11-23 LAB — CBC
HCT: 40.7 % (ref 36.0–46.0)
Hemoglobin: 13.6 g/dL (ref 12.0–15.0)
MCH: 27 pg (ref 26.0–34.0)
MCHC: 33.4 g/dL (ref 30.0–36.0)
MCV: 80.9 fL (ref 80.0–100.0)
Platelets: 253 10*3/uL (ref 150–400)
RBC: 5.03 MIL/uL (ref 3.87–5.11)
RDW: 13.7 % (ref 11.5–15.5)
WBC: 9.1 10*3/uL (ref 4.0–10.5)
nRBC: 0 % (ref 0.0–0.2)

## 2019-11-23 LAB — BASIC METABOLIC PANEL
Anion gap: 11 (ref 5–15)
BUN: 16 mg/dL (ref 6–20)
CO2: 23 mmol/L (ref 22–32)
Calcium: 9.5 mg/dL (ref 8.9–10.3)
Chloride: 103 mmol/L (ref 98–111)
Creatinine, Ser: 0.92 mg/dL (ref 0.44–1.00)
GFR calc Af Amer: >60 mL/min (ref >60)
GFR calc non Af Amer: >60 mL/min (ref >60)
Glucose, Bld: 247 mg/dL — ABNORMAL HIGH (ref 70–99)
Potassium: 3.8 mmol/L (ref 3.5–5.1)
Sodium: 137 mmol/L (ref 135–145)

## 2019-11-23 LAB — MAGNESIUM: Magnesium: 2 mg/dL (ref 1.7–2.4)

## 2019-11-23 NOTE — Progress Notes (Signed)
Patient arrived for exercise today and walked at [facility] on the Heart Walk trail. She arrived late for her visit with me (CEP) and we walked for 50 mins and stretched for 5 mins. She completed 1.8 miles. She was slightly more short of breath today. She says she weighed 345lbs. yesterday (she did not weigh before arriving today). We plan to meet and walk again Thursday 3/11 at 7:30am.   Time spent with patient (face to face) 55 mins   Lesia Hausen, MS, ACSM-RCEP Clinical Exercise Physiologist/ Health and Wellness Coach

## 2019-11-24 ENCOUNTER — Other Ambulatory Visit (HOSPITAL_COMMUNITY): Payer: Self-pay

## 2019-11-24 ENCOUNTER — Telehealth (HOSPITAL_COMMUNITY): Payer: Self-pay | Admitting: Licensed Clinical Social Worker

## 2019-11-24 ENCOUNTER — Other Ambulatory Visit (HOSPITAL_COMMUNITY): Payer: Self-pay | Admitting: *Deleted

## 2019-11-24 DIAGNOSIS — O903 Peripartum cardiomyopathy: Secondary | ICD-10-CM | POA: Diagnosis not present

## 2019-11-24 DIAGNOSIS — Z6841 Body Mass Index (BMI) 40.0 and over, adult: Secondary | ICD-10-CM | POA: Diagnosis not present

## 2019-11-24 DIAGNOSIS — Z7984 Long term (current) use of oral hypoglycemic drugs: Secondary | ICD-10-CM | POA: Diagnosis not present

## 2019-11-24 DIAGNOSIS — E119 Type 2 diabetes mellitus without complications: Secondary | ICD-10-CM | POA: Diagnosis not present

## 2019-11-24 DIAGNOSIS — F17210 Nicotine dependence, cigarettes, uncomplicated: Secondary | ICD-10-CM | POA: Diagnosis not present

## 2019-11-24 DIAGNOSIS — J45909 Unspecified asthma, uncomplicated: Secondary | ICD-10-CM | POA: Diagnosis not present

## 2019-11-24 DIAGNOSIS — I340 Nonrheumatic mitral (valve) insufficiency: Secondary | ICD-10-CM | POA: Diagnosis not present

## 2019-11-24 DIAGNOSIS — I5022 Chronic systolic (congestive) heart failure: Secondary | ICD-10-CM

## 2019-11-24 DIAGNOSIS — Z452 Encounter for adjustment and management of vascular access device: Secondary | ICD-10-CM | POA: Diagnosis not present

## 2019-11-24 DIAGNOSIS — Z79899 Other long term (current) drug therapy: Secondary | ICD-10-CM | POA: Diagnosis not present

## 2019-11-24 DIAGNOSIS — I5023 Acute on chronic systolic (congestive) heart failure: Secondary | ICD-10-CM | POA: Diagnosis not present

## 2019-11-24 DIAGNOSIS — Z5181 Encounter for therapeutic drug level monitoring: Secondary | ICD-10-CM | POA: Diagnosis not present

## 2019-11-24 MED ORDER — MAGNESIUM OXIDE -MG SUPPLEMENT 400 (240 MG) MG PO TABS: ORAL_TABLET | ORAL | 6 refills | Status: DC

## 2019-11-24 MED ORDER — SPIRONOLACTONE 25 MG PO TABS: 25.0000 mg | ORAL_TABLET | Freq: Every day | ORAL | 3 refills | Status: DC

## 2019-11-24 NOTE — Progress Notes (Addendum)
Paramedicine Encounter    [name], female    DOB: 1981/02/08, 39 y.o.   MRN: [id number]   Patient Care Team: System, Provider Not In as PCP - General Constance Haw, MD as PCP - Electrophysiology (Cardiology) Larey Dresser, MD as PCP - Advanced Heart Failure (Cardiology)  Patient Active Problem List   Diagnosis Date Noted  . Acute on chronic systolic CHF (congestive heart failure) ([place]) 06/17/2019  . Obstructive sleep apnea 06/17/2019  . 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 Diabetes mellitus ([place]) 06/17/2019  . Amenorrhea 08/03/2018  . Congestive heart failure ([place]) 06/29/2018  . Hypotension 06/07/2018  . Rhinovirus infection 06/06/2018  . Asthma 06/05/2018  . Acute respiratory failure with hypoxia ([place]) 06/05/2018  . Hypokalemia 06/05/2018  . Snoring 09/23/2016  . Cough 04/10/2016  . NICM (nonischemic cardiomyopathy) ([place]) 01/25/2016  . Tobacco abuse 01/25/2016  . Acute on chronic combined systolic and diastolic CHF (congestive heart failure) ([place]) 01/25/2016  . Mitral regurgitation   . Abnormal EKG-inf TWI 01/15/2016  . Positive D dimer-CTA neg for PE 01/15/2016  . Morbid obesity- BMI 57 01/14/2016  . Abdominal pain 01/14/2016  . 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 At risk for sleep apnea 01/14/2016  . Cardiomyopathy ([place]) 11/01/2015  . Morbid obesity with BMI of 50.0-59.9, adult ([place]) 08/16/2015    Current Outpatient Medications:  .  atorvastatin (LIPITOR) 10 MG tablet, Take 1 tablet (10 mg total) by mouth daily. (Patient taking differently: Take 10 mg by mouth daily. ), Disp: 90 tablet, Rfl: 3 .  carvedilol (COREG) 6.25 MG tablet, Take 1 tablet (6.25 mg total) by mouth 2 (two) times daily., Disp: 180 tablet, Rfl: 3 .  digoxin (LANOXIN) 0.125 MG tablet, Take 1 tablet (0.125 mg total) by mouth daily. (Patient taking differently: Take 0.125 mg by mouth daily. ), Disp: 90 tablet, Rfl: 3 .  ivabradine (CORLANOR) 7.5  MG TABS tablet, Take 1 tablet (7.5 mg total) by mouth 2 (two) times daily with a meal., Disp: 60 tablet, Rfl: 3 .  metolazone (ZAROXOLYN) 2.5 MG tablet, TAKE 1 TABLET(2.5 MG) BY MOUTH 1 TIME A WEEK, Disp: 10 tablet, Rfl: 3 .  milrinone (PRIMACOR) 20 MG/100 ML SOLN infusion, Inject 0.0414 mg/min into the vein continuous., Disp:  , Rfl:  .  potassium chloride (KLOR-CON) 10 MEQ tablet, Take 8 tablets by mouth every morning and every afternoon and take 6 tablets by mouth every evening. Take an additional 11meq with metolazone. (Patient taking differently: Take 8 tablets by mouth every morning and every afternoon and take 6 tablets by mouth every evening. Take an additional 29meq with metolazone.), Disp: 556 tablet, Rfl: 5 .  PRESCRIPTION MEDICATION, CPAP- AT BEDTIME, Disp: , Rfl:  .  sacubitril-valsartan (ENTRESTO) 24-26 MG, Take 1 tablet by mouth 2 (two) times daily., Disp: 60 tablet, Rfl: 5 .  torsemide (DEMADEX) 20 MG tablet, Take 4 tablets (80 mg total) by mouth 2 (two) times daily., Disp: 720 tablet, Rfl: 3 .  albuterol (PROAIR HFA) 108 (90 Base) MCG/ACT inhaler, Inhale 2 puffs into the lungs every 6 (six) hours as needed for wheezing or shortness of breath., Disp: , Rfl:  .  ipratropium-albuterol (DUONEB) 0.5-2.5 (3) MG/3ML SOLN, Take 3 mLs by nebulization every 4 (four) hours as needed. (Patient not taking: Reported on 11/03/2019), Disp: 360 mL, Rfl: 0 .  Magnesium Oxide 400 (240 Mg) MG TABS, Take 2 tablets by mouth every morning and 1 tablet by mouth every evening, Disp: 90  tablet, Rfl: 6 .  ondansetron (ZOFRAN) 4 MG tablet, Take 1 tablet (4 mg total) by mouth every 8 (eight) hours as needed for nausea or vomiting. (Patient not taking: Reported on 11/03/2019), Disp: 20 tablet, Rfl: 0 .  spironolactone (ALDACTONE) 25 MG tablet, Take 1 tablet (25 mg total) by mouth daily., Disp: 90 tablet, Rfl: 3 Allergies  Allergen Reactions  . Metformin And Related Diarrhea    And caused severe "dry mouth," also       Social History   Socioeconomic History  . Marital status: Single    Spouse name: Not on file  . Number of children: 2  . Years of education: Not on file  . Highest education level: Not on file  Occupational History  . Not on file  Tobacco Use  . Smoking status: Former Smoker    Quit date: 10/19/2015    Years since quitting: 4.1  . Smokeless tobacco: Never Used  Substance and Sexual Activity  . Alcohol use: Yes    Alcohol/week: 0.0 standard drinks    Comment: occasionally  . Drug use: Yes    Types: Marijuana    Comment: only socially  . Sexual activity: Yes  Other Topics Concern  . Not on file  Social History Narrative  . Not on file   Social Determinants of Health   Financial Resource Strain: High Risk  . Difficulty of Paying Living Expenses: Hard  Food Insecurity: Food Insecurity Present  . Worried About Programme researcher, broadcasting/film/video in the Last Year: Sometimes true  . Ran Out of Food in the Last Year: Sometimes true  Transportation Needs: No Transportation Needs  . Lack of Transportation (Medical): No  . Lack of Transportation (Non-Medical): No  Physical Activity:   . Days of Exercise per Week: Not on file  . Minutes of Exercise per Session: Not on file  Stress:   . Feeling of Stress : Not on file  Social Connections:   . Frequency of Communication with Friends and Family: Not on file  . Frequency of Social Gatherings with Friends and Family: Not on file  . Attends Religious Services: Not on file  . Active Member of Clubs or Organizations: Not on file  . Attends Banker Meetings: Not on file  . Marital Status: Not on file  Intimate Partner Violence:   . Fear of Current or Ex-Partner: Not on file  . Emotionally Abused: Not on file  . Physically Abused: Not on file  . Sexually Abused: Not on file    Physical Exam      Future Appointments  Date Time Provider Department Center  12/06/2019  8:30 AM [facility] PA/NP [facility] None  12/27/2019 11:20 AM  Laurey Morale, MD [facility] None  01/24/2020  7:45 AM [facility] DEVICE REMOTES [facility]  04/24/2020  7:45 AM [facility] DEVICE REMOTES [facility]  07/24/2020  7:45 AM [facility] DEVICE REMOTES [facility]    BP (!) 92/0   Pulse 82   Resp 18   SpO2 98%   Weight today-349 Weight yesterday-349 Last visit weight-343  Pt reports she is feeling better today, yesterday she went for walk with kristen and she felt more short of breath than usual. But her milrinone bag was beeping during that time so she was running low on that med. The nurse was suppose to come yesterday but said she knocked on the door for and Maleeya did not come to door so she left. Nichol called her and said  she was home and didn't hear anybody knocking, nurse didn't try calling her either before she left. Nurse told her to change her bag and she would come tomorrow to change dressing.   Carrie--with community care called her -left her a VM to call her back.  Got her another pill box and filled pill box.  She ran out of her magnesium-has enough for tonight, Thursday and Friday dose.  She ran out of her entresto--she has enough for tonight through sun night dosing.  She ran out of spiro-she will have some through Monday.  I told pt to be sure to look at her pills to ensure she gets all of her meds in when she picks up her prescriptions from pharmacy--jasmine will send over the spiro and updated dose of mag. I did tell her the days that the meds are missing in.  She will restart taking her metolazone today, she had to miss last week b/c of her potassium being so low and pt did admit to self-dosing her potassium b/c they are so hard to swallow.  jenna got pharmacy to do the tabs so they are smaller and more easier to swallow. Pt states those are much easier to get down.  She did admit to going back to eating a lunch meal instead of the shake.   She reports her breathing is doing  better today.  No dizziness, no palpitations so far today.  Nurse is suppose to come today to change dressing.   Kerry Hough, EMT-Paramedic [phone] [facility] Paramedic  11/24/19

## 2019-11-24 NOTE — Telephone Encounter (Signed)
Pt lab results yesterday show normal potassium level of 3.8- discussed with Dr. Shirlee Latch and pt cleared to start taking metolazone again today with extra of potassium- CSW updated Community Paramedic who is helping pt set up med box and will make the adjustment.  CSW will continue to follow and assist as needed  Burna Sis, LCSW Clinical Social Worker [facility] Desk#: [phone] Cell#: [phone]

## 2019-11-25 ENCOUNTER — Encounter (HOSPITAL_COMMUNITY): Payer: Self-pay | Admitting: *Deleted

## 2019-11-25 ENCOUNTER — Encounter (HOSPITAL_COMMUNITY): Payer: Medicaid Other

## 2019-11-25 NOTE — Progress Notes (Signed)
Today's weight (without clothes at home) 347.5lbs. She did not eat or take medications prior to her walk with me today   Accomplishments/positives: - She worked out Monday with a walk at the [facility] Heart walk with me, and yesterday she worked out from the book from [employer] center, "[employer]" with her brother - Today she walked 2.3 miles in 60 minutes.  - She went to [facility] and saved some money and bought lots of vegetables and fresh fruits - Going to try a veggie noodle soup and is very excited   Challenges: - Previously has been challenged with taking her potassium due to the size, this has been discussed with pharmacy and adjusted by clinic staff for smaller, more pills to help her to take the potassium as prescribed.  - She is struggling with being hungry and is admitting that she wasn't eating at all when she was first losing weight, and now she is back to eating 2 meals a day and using the shakes for breakfast and for snacks. - "too much going on in her life" - Her daughters' health and schooling - She is tired of eating the same meals (we discussed some other options for variety) - She does not feel like her providers are seeing that she is trying  Recommendations/plans:  She plans to walk with CSW [place] tomorrow at 10am and with her daughter Saturday.   Continue snacking with raw fruits and vegetables, consider her portions  Exercise every day where possible and she was encouraged to scale back the intensity on one of the days this weekend.  She wants to re-set and restart her cycle mentally   Next walking/coaching session:   Monday: 8:30am (Heart Walk at [facility])  Tuesday: 8:30am (Heart Walk at [facility])  Thursday 7:30am (Heart Walk at [facility])   Total time spent with patient (face to face): 60 mins     Lesia Hausen, MS, ACSM-RCEP Clinical Exercise Physiologist/ Health and Wellness Coach

## 2019-11-26 ENCOUNTER — Other Ambulatory Visit (HOSPITAL_COMMUNITY): Payer: Self-pay | Admitting: Cardiology

## 2019-11-26 ENCOUNTER — Telehealth (HOSPITAL_COMMUNITY): Payer: Self-pay | Admitting: Licensed Clinical Social Worker

## 2019-11-26 NOTE — Telephone Encounter (Signed)
CSW met with pt to walk- completed approximately 3 miles- patient reports doing pretty well today.  Has been somewhat discouraged recently with her weight and feeling like giving up sometimes but prayed about it last night and knows she wants to live and keep fighting to be eligible for surgery.  CSW will continue to follow and assist as needed  Jorge Ny, [facility] Desk#: [phone] Cell#: [phone]

## 2019-11-29 ENCOUNTER — Telehealth (HOSPITAL_COMMUNITY): Payer: Self-pay | Admitting: Licensed Clinical Social Worker

## 2019-11-29 ENCOUNTER — Telehealth (HOSPITAL_COMMUNITY): Payer: Self-pay | Admitting: *Deleted

## 2019-11-29 NOTE — Telephone Encounter (Signed)
Kirsten Wheeler had an appointment set up (last week) for today at 8:30am to walk at [facility] Heart Walk with [facility]. She called to cancel for today as she is having car difficulty and is not able to transport here. She walked 30 minutes this morning and said this was not enough for her and is looking to walk with a friend for another 30+ mins this afternoon. Will see her tomorrow morning at [facility] at 7:30am.     Lesia Hausen, MS, ACSM-RCEP Clinical Exercise Physiologist/ Health and Wellness Coach

## 2019-11-29 NOTE — Telephone Encounter (Signed)
CSW called pt to see if they have received or been scheduled to receive the COVID-19 vaccine at this time.  Pt has not scheduled vaccine but is interested in doing so.  CSW explained options and patient would like to go to [employer]- believes she already has an online account so will attempt to sign up online and call CSW back to let know if she encounters any problems.  CSW will continue to follow and assist as needed  Burna Sis, LCSW Clinical Social Worker [facility] Desk#: [phone] Cell#: [phone]

## 2019-11-30 ENCOUNTER — Encounter (HOSPITAL_COMMUNITY): Payer: Self-pay | Admitting: *Deleted

## 2019-11-30 ENCOUNTER — Ambulatory Visit (INDEPENDENT_AMBULATORY_CARE_PROVIDER_SITE_OTHER): Payer: Medicaid Other | Admitting: Family Medicine

## 2019-11-30 DIAGNOSIS — E119 Type 2 diabetes mellitus without complications: Secondary | ICD-10-CM | POA: Diagnosis not present

## 2019-11-30 DIAGNOSIS — F17210 Nicotine dependence, cigarettes, uncomplicated: Secondary | ICD-10-CM | POA: Diagnosis not present

## 2019-11-30 DIAGNOSIS — I340 Nonrheumatic mitral (valve) insufficiency: Secondary | ICD-10-CM | POA: Diagnosis not present

## 2019-11-30 DIAGNOSIS — J45909 Unspecified asthma, uncomplicated: Secondary | ICD-10-CM | POA: Diagnosis not present

## 2019-11-30 DIAGNOSIS — G4733 Obstructive sleep apnea (adult) (pediatric): Secondary | ICD-10-CM | POA: Diagnosis not present

## 2019-11-30 DIAGNOSIS — Z6841 Body Mass Index (BMI) 40.0 and over, adult: Secondary | ICD-10-CM | POA: Diagnosis not present

## 2019-11-30 DIAGNOSIS — Z452 Encounter for adjustment and management of vascular access device: Secondary | ICD-10-CM | POA: Diagnosis not present

## 2019-11-30 DIAGNOSIS — Z79899 Other long term (current) drug therapy: Secondary | ICD-10-CM | POA: Diagnosis not present

## 2019-11-30 DIAGNOSIS — Z7984 Long term (current) use of oral hypoglycemic drugs: Secondary | ICD-10-CM | POA: Diagnosis not present

## 2019-11-30 DIAGNOSIS — I5023 Acute on chronic systolic (congestive) heart failure: Secondary | ICD-10-CM | POA: Diagnosis not present

## 2019-11-30 DIAGNOSIS — I5084 End stage heart failure: Secondary | ICD-10-CM | POA: Diagnosis not present

## 2019-11-30 DIAGNOSIS — E669 Obesity, unspecified: Secondary | ICD-10-CM | POA: Diagnosis not present

## 2019-11-30 DIAGNOSIS — O903 Peripartum cardiomyopathy: Secondary | ICD-10-CM | POA: Diagnosis not present

## 2019-11-30 DIAGNOSIS — I10 Essential (primary) hypertension: Secondary | ICD-10-CM | POA: Diagnosis not present

## 2019-11-30 DIAGNOSIS — Z5181 Encounter for therapeutic drug level monitoring: Secondary | ICD-10-CM | POA: Diagnosis not present

## 2019-11-30 DIAGNOSIS — E1165 Type 2 diabetes mellitus with hyperglycemia: Secondary | ICD-10-CM | POA: Diagnosis not present

## 2019-11-30 DIAGNOSIS — Z9989 Dependence on other enabling machines and devices: Secondary | ICD-10-CM | POA: Diagnosis not present

## 2019-11-30 NOTE — Progress Notes (Signed)
Today arrived 30 minutes late for 7:30 scheduled time for walking at [facility] the [facility]. We were able to complete 35 minutes of exercise around the heart walk trail for 1.5 miles. She continued to walk an extra 0.5 miles to complete her 2 mile loop. Today she stated she did not take any medication or eat prior to arrival. She said her milrinone bag was scheduled for change-out today so she was more short of breath that normal. We plan to meet again on Thursday 3/18 at 7:30am.   Yesterday's home weight: 345lbs.     Lesia Hausen, MS, ACSM-RCEP Clinical Exercise Physiologist/ Health and Wellness Coach

## 2019-12-01 ENCOUNTER — Other Ambulatory Visit (HOSPITAL_COMMUNITY): Payer: Self-pay

## 2019-12-01 NOTE — Progress Notes (Signed)
Paramedicine Encounter    [name], female    DOB: 08-Jan-1981, 39 y.o.   MRN: [id number]   Patient Care Team: System, Provider Not In as PCP - General Regan Lemming, MD as PCP - Electrophysiology (Cardiology) Laurey Morale, MD as PCP - Advanced Heart Failure (Cardiology)  Patient Active Problem List   Diagnosis Date Noted  . Acute on chronic systolic CHF (congestive heart failure) (HCC) 06/17/2019  . Obstructive sleep apnea 06/17/2019  . 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 Diabetes mellitus (HCC) 06/17/2019  . Amenorrhea 08/03/2018  . Congestive heart failure (HCC) 06/29/2018  . Hypotension 06/07/2018  . Rhinovirus infection 06/06/2018  . Asthma 06/05/2018  . Acute respiratory failure with hypoxia (HCC) 06/05/2018  . Hypokalemia 06/05/2018  . Snoring 09/23/2016  . Cough 04/10/2016  . 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 Tobacco abuse 01/25/2016  . Acute on chronic combined systolic and diastolic CHF (congestive heart failure) (HCC) 01/25/2016  . Mitral regurgitation   . Abnormal EKG-inf TWI 01/15/2016  . Positive D dimer-CTA neg for PE 01/15/2016  . Morbid obesity- BMI 57 01/14/2016  . Abdominal pain 01/14/2016  . 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 At risk for sleep apnea 01/14/2016  . Cardiomyopathy (HCC) 11/01/2015  . Morbid obesity with BMI of 50.0-59.9, adult (HCC) 08/16/2015    Current Outpatient Medications:  .  albuterol (PROAIR HFA) 108 (90 Base) MCG/ACT inhaler, Inhale 2 puffs into the lungs every 6 (six) hours as needed for wheezing or shortness of breath., Disp: , Rfl:  .  atorvastatin (LIPITOR) 10 MG tablet, Take 1 tablet (10 mg total) by mouth daily. (Patient taking differently: Take 10 mg by mouth daily. ), Disp: 90 tablet, Rfl: 3 .  carvedilol (COREG) 6.25 MG tablet, Take 1 tablet (6.25 mg total) by mouth 2 (two) times daily., Disp: 180 tablet, Rfl: 3 .  digoxin (LANOXIN) 0.125 MG  tablet, Take 1 tablet (0.125 mg total) by mouth daily. (Patient taking differently: Take 0.125 mg by mouth daily. ), Disp: 90 tablet, Rfl: 3 .  ivabradine (CORLANOR) 7.5 MG TABS tablet, Take 1 tablet (7.5 mg total) by mouth 2 (two) times daily with a meal., Disp: 60 tablet, Rfl: 3 .  Magnesium Oxide 400 (240 Mg) MG TABS, Take 2 tablets by mouth every morning and 1 tablet by mouth every evening, Disp: 90 tablet, Rfl: 6 .  metolazone (ZAROXOLYN) 2.5 MG tablet, TAKE 1 TABLET(2.5 MG) BY MOUTH 1 TIME A WEEK, Disp: 10 tablet, Rfl: 3 .  milrinone (PRIMACOR) 20 MG/100 ML SOLN infusion, Inject 0.0414 mg/min into the vein continuous., Disp:  , Rfl:  .  potassium chloride (KLOR-CON) 10 MEQ tablet, Take 8 tablets by mouth every morning and every afternoon and take 6 tablets by mouth every evening. Take an additional with metolazone. (Patient taking differently: Take 8 tablets by mouth every morning and every afternoon and take 6 tablets by mouth every evening. Take an additional with metolazone.), Disp: 556 tablet, Rfl: 5 .  PRESCRIPTION MEDICATION, CPAP- AT BEDTIME, Disp: , Rfl:  .  sacubitril-valsartan (ENTRESTO) 24-26 MG, Take 1 tablet by mouth 2 (two) times daily., Disp: 60 tablet, Rfl: 5 .  spironolactone (ALDACTONE) 25 MG tablet, Take 1 tablet (25 mg total) by mouth daily., Disp: 90 tablet, Rfl: 3 .  torsemide (DEMADEX) 20 MG tablet, Take 4 tablets (80 mg total) by mouth 2 (two) times daily., Disp: 720 tablet, Rfl: 3 .  ipratropium-albuterol (DUONEB) 0.5-2.5 (3)  MG/3ML SOLN, Take 3 mLs by nebulization every 4 (four) hours as needed. (Patient not taking: Reported on 11/03/2019), Disp: 360 mL, Rfl: 0 .  ondansetron (ZOFRAN) 4 MG tablet, Take 1 tablet (4 mg total) by mouth every 8 (eight) hours as needed for nausea or vomiting. (Patient not taking: Reported on 11/03/2019), Disp: 20 tablet, Rfl: 0 Allergies  Allergen Reactions  . Metformin And Related Diarrhea    And caused severe "dry mouth," also       Social History   Socioeconomic History  . Marital status: Single    Spouse name: Not on file  . Number of children: 2  . Years of education: Not on file  . Highest education level: Not on file  Occupational History  . Not on file  Tobacco Use  . Smoking status: Former Smoker    Quit date: 10/19/2015    Years since quitting: 4.1  . Smokeless tobacco: Never Used  Substance and Sexual Activity  . Alcohol use: Yes    Alcohol/week: 0.0 standard drinks    Comment: occasionally  . Drug use: Yes    Types: Marijuana    Comment: only socially  . Sexual activity: Yes  Other Topics Concern  . Not on file  Social History Narrative  . Not on file   Social Determinants of Health   Financial Resource Strain: High Risk  . Difficulty of Paying Living Expenses: Hard  Food Insecurity: Food Insecurity Present  . Worried About Charity fundraiser in the Last Year: Sometimes true  . Ran Out of Food in the Last Year: Sometimes true  Transportation Needs: No Transportation Needs  . Lack of Transportation (Medical): No  . Lack of Transportation (Non-Medical): No  Physical Activity:   . Days of Exercise per Week:   . Minutes of Exercise per Session:   Stress:   . Feeling of Stress :   Social Connections:   . Frequency of Communication with Friends and Family:   . Frequency of Social Gatherings with Friends and Family:   . Attends Religious Services:   . Active Member of Clubs or Organizations:   . Attends Archivist Meetings:   Marland Kitchen Marital Status:   Intimate Partner Violence:   . Fear of Current or Ex-Partner:   . Emotionally Abused:   Marland Kitchen Physically Abused:   . Sexually Abused:     Physical Exam      Future Appointments  Date Time Provider [place]  12/06/2019  8:30 AM [facility] PA/NP [facility] None  12/27/2019 11:20 AM Larey Dresser, MD [facility] None  01/24/2020  7:45 AM [facility] DEVICE REMOTES [facility]  04/24/2020  7:45 AM [facility]  DEVICE REMOTES [facility]  07/24/2020  7:45 AM [facility] DEVICE REMOTES [facility]    BP (!) 94/0   Pulse 80   Temp 98.1 F (36.7 C)   Resp 18   SpO2 98%  Weight today-347 Weight yesterday-350 Last visit weight-349  Pt reports this past week has been up and down for her b/c her weight is going back up.  She has been eating a lunch past couple weeks.  She also has started taking ACV gummy vitamins to help curb appetite.  Her weight loss appointment doc wanted her to start taking Topamax.  She has not p/u the meds we ordered last week. So she has missed those doses b/c she didn't have enough to get through the whole week.   She is getting really frustrated with lack  of weight loss.  I had to go p/u her meds for her--I got her the OTC magnesium-pharmacy didn't have it ready even though we called it in last week and they didn't even have the right dosage.  I [facility] needed and to assist with waiving co-pay costs and she wants to do that.   All meds are here now and meds verified and pill box refilled.   Kerry Hough, EMT-Paramedic [phone] [facility] Paramedic  12/02/19

## 2019-12-03 ENCOUNTER — Telehealth (HOSPITAL_COMMUNITY): Payer: Self-pay | Admitting: *Deleted

## 2019-12-03 NOTE — Telephone Encounter (Signed)
Today's weight (without clothes at home) 345lbs.     Accomplishments/positives:  She and her daughter exercised together and made a pact together to be more mindful in their caloric intake. They laughed and they both enjoyed the time together.  She and her daughter want to support each other in weight loss.  She and her daughter made a pact to only have water for 2weeks, no juice or soda.  She is reaching out to friends for support while she walks and they are encouraging to her.   She tried a veggie noodle soup and was excited for how it turned out, that it was healthy and lasted. She was thrilled that her children liked it too.   She is eating only baked fish this week, and is cooking in bulk.    Challenges:  She did not show for her walk yesterday 3/18 because she thought it was canceled due to weather. I was unable to get in touch with her from Wednesday -Thursday.   Her moods, and her weight loss doctor wants to start her on Topamax to help suppress her appetite, She says Dr. Shirlee Latch is on board for this as well. She is nervous about the side effects and how it may affect her potassium.   Her therapist is leaving after working with her for 2 years and she is nervous about the transition from one therapist to another.--but she sees opportunity for new growth in a new therapist.    She finds days that she doesn't want to deal with things in life and the responsibilities but once she breaks through she knew it was what she needed and that she needed to persevere.   Recommendations/plans:  She plans to walk with CSW [place] this afternoon, independent walk Saturday and Sunday   Continue trying to eat raw fruits and vegetables to increase her intake   Exercise every day where possible and she was encouraged to scale back the intensity on one of the days this weekend.  She wants to re-focus on how to dismiss negative energy/news in her life. She was more control over her reactions to  negativity.   She is going to journal at night to help reflect on her day and find her gratitude.   **Plans to walk/workouts Wednesday and Friday Independently     Next walking/coaching session:   Monday: 9:00am (Heart Walk at [facility])  Tuesday: 8:30am (Heart Walk at [facility])  Thursday 8:00am (Heart Walk at [facility])   Total time spent with patient (telephonically) 50 mins        Kirsten Hausen, MS, ACSM-RCEP Clinical Exercise Physiologist/ Health and Wellness Coach

## 2019-12-05 NOTE — Progress Notes (Signed)
Advanced Heart Failure Clinic Note   Referring Physician:PCP: System, Provider Not In PCP-Cardiologist: Dr. 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   She presented to the ER on 06/17/19 with several days of worsening HF with SOB at rest, marked edema and orthopnea/PND, not responding to increased doses of oral diuretics. She admitted to high levels of water and Gatorade intake.   Her admit wt was 380 lb (Dry wt ~360 lb). She was started on IV Lasix but had initial poor urinary response. PO metolazone added as adjunct. Repeat echo was obtained and showed EF <20%, relatively normal RV, moderate MR. RHC 10/5 with low output (CI 1.8) and elevated PCWP but near normal RA pressure. She was started on inotropic therapy w/ milrinone for low output, 0.25 mcg/kg/min. Also treated w/ Entresto, Coreg, spironolactone, digoxin and ivabradine. She was later transitioned off of IV Lasix to po torsemide 80 mg qam/60 mg qpm.   Unfortunately, she was not a transplant candidate due to her morbid obesity. We discussed her at [facility] and Dr. Donata Clay has seen. She will need significant weight loss to get to LVAD. Will use home milrinone to try to facilitate increased activity to work towards weight loss. She understands that milrinone is not a good end-point and that we would like to use it as a bridge to eventual LVAD. RV function seems adequate for LVAD. Pt is motivated to lose weight. Tunnel cath was placed for home milrinone and home health was arranged. She was discharged home on 10/9 on milrinone 0.25 mcg.   06/27/19 she presented to the [facility] w/ CC of palpitations. Was brought in by EMS and was reportedly in SVT with HR in the 180s. She was given adenosine and converted to NSR prior to arrival to the ED. In the ED she was stable and symptoms had resolved. Labs showed normal CBC. BMP  showed hypokalemia (3.0), hyperglycemia (243), mildly elevated BUN/SCr, and low Mag, 1.5. She was given K and Mg supplementation and discharged home from the ED.  PICC exchanged 10/15/19  Today she returns for HF follow up. Overall feeling fine. SOB with hills. Denies PND/Orthopnea. Not using CPAP nightly.  Appetite ok. No fever or chills. Weight at home 345  pounds. Walking 2.5 miles 4 days a week. Now taking all medications. Last week she had been out of a few medications for several days.  Followed by [facility], [facility]. Followed by HF Paramedicine.   RHC Procedural Findings (10/5): Hemodynamics (mmHg) RA mean 7 RV 49/10 PA 65/25 mean 45 PCWP mean 27 Oxygen saturations: PA 57% AO 100% Cardiac Output (Fick) 4.65  Cardiac Index (Fick) 1.82 PVR 3.87 WU PAPI 5.7  2D Echo 06/18/19  Left ventricular ejection fraction, by visual estimation, is <20%. The left ventricle has severely decreased function. Severely increased left ventricular size. There is no left ventricular hypertrophy. Severe global hypokinesis. 2. Left ventricular diastolic Doppler parameters are indeterminate pattern of LV diastolic filling. 3. Global right ventricle has low normal systolic function.The right ventricular size is normal. No increase in right ventricular wall thickness. 4. Left atrial size was severely dilated. 5. Right atrial size was mildly dilated. 6. The mitral valve is abnormal. Moderate mitral valve regurgitation. 7. The tricuspid valve is grossly normal. Tricuspid valve regurgitation is trivial. 8. The aortic valve is tricuspid Aortic valve regurgitation was not visualized by color  flow Doppler. 9. The pulmonic valve was grossly normal. Pulmonic valve regurgitation is not visualized by color flow Doppler. 10. Mildly elevated pulmonary artery systolic pressure. 11. A pacer wire is visualized. 12. The inferior vena cava is dilated in size with >50% respiratory variability, suggesting right atrial  pressure of 8 mmHg. 13. The interatrial septum was not well visualized.+   Past Medical History:  Diagnosis Date  . Asthma   . Chronic systolic CHF (congestive heart failure) ([place])    a. ECHO 01/15/2016 EF 10-15%. 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  . Diabetes mellitus ([place]) 06/17/2019  . Hearing loss    bilateral  . Mitral regurgitation    a. severe, felt to be functional 2/2 LV dilation   . Morbid obesity ([place])   . Snoring     Current Outpatient Medications  Medication Sig Dispense Refill  . albuterol (PROAIR HFA) 108 (90 Base) MCG/ACT inhaler Inhale 2 puffs into the lungs every 6 (six) hours as needed for wheezing or shortness of breath.    Marland Kitchen atorvastatin (LIPITOR) 10 MG tablet Take 1 tablet (10 mg total) by mouth daily. (Patient taking differently: Take 10 mg by mouth daily. ) 90 tablet 3  . carvedilol (COREG) 6.25 MG tablet Take 1 tablet (6.25 mg total) by mouth 2 (two) times daily. 180 tablet 3  . digoxin (LANOXIN) 0.125 MG tablet Take 1 tablet (0.125 mg total) by mouth daily. (Patient taking differently: Take 0.125 mg by mouth daily. ) 90 tablet 3  . ipratropium-albuterol (DUONEB) 0.5-2.5 (3) MG/3ML SOLN Take 3 mLs by nebulization every 4 (four) hours as needed. 360 mL 0  . ivabradine (CORLANOR) 7.5 MG TABS tablet Take 1 tablet (7.5 mg total) by mouth 2 (two) times daily with a meal. 60 tablet 3  . Magnesium Oxide 400 (240 Mg) MG TABS Take 2 tablets by mouth every morning and 1 tablet by mouth every evening 90 tablet 6  . metolazone (ZAROXOLYN) 2.5 MG tablet TAKE 1 TABLET(2.5 MG) BY MOUTH 1 TIME A WEEK 10 tablet 3  . milrinone (PRIMACOR) 20 MG/100 ML SOLN infusion Inject 0.0414 mg/min into the vein continuous.    . ondansetron (ZOFRAN) 4 MG tablet Take 1 tablet (4 mg total) by mouth every 8 (eight) hours as needed for nausea or vomiting. 20 tablet 0  . potassium chloride (KLOR-CON) 10 MEQ tablet Take 60 mEq by mouth 3 (three) times daily.    Marland Kitchen PRESCRIPTION MEDICATION CPAP- AT  BEDTIME    . sacubitril-valsartan (ENTRESTO) 24-26 MG Take 1 tablet by mouth 2 (two) times daily. 60 tablet 5  . spironolactone (ALDACTONE) 25 MG tablet Take 1 tablet (25 mg total) by mouth daily. 90 tablet 3  . topiramate (TOPAMAX) 25 MG tablet Take by mouth.    . torsemide (DEMADEX) 20 MG tablet Take 4 tablets (80 mg total) by mouth 2 (two) times daily. 720 tablet 3   No current facility-administered medications for this encounter.    Allergies  Allergen Reactions  . Metformin And Related Diarrhea    And caused severe "dry mouth," also      Social History   Socioeconomic History  . Marital status: Single    Spouse name: Not on file  . Number of children: 2  . Years of education: Not on file  . Highest education level: Not on file  Occupational History  . Not on file  Tobacco Use  . Smoking status: Former Smoker    Quit date: 10/19/2015  Years since quitting: 4.1  . Smokeless tobacco: Never Used  Substance and Sexual Activity  . Alcohol use: Yes    Alcohol/week: 0.0 standard drinks    Comment: occasionally  . Drug use: Yes    Types: Marijuana    Comment: only socially  . Sexual activity: Yes  Other Topics Concern  . Not on file  Social History Narrative  . Not on file   Social Determinants of Health   Financial Resource Strain: High Risk  . Difficulty of Paying Living Expenses: Hard  Food Insecurity: Food Insecurity Present  . Worried About Programme researcher, broadcasting/film/video in the Last Year: Sometimes true  . Ran Out of Food in the Last Year: Sometimes true  Transportation Needs: No Transportation Needs  . Lack of Transportation (Medical): No  . Lack of Transportation (Non-Medical): No  Physical Activity:   . Days of Exercise per Week:   . Minutes of Exercise per Session:   Stress:   . Feeling of Stress :   Social Connections:   . Frequency of Communication with Friends and Family:   . Frequency of Social Gatherings with Friends and Family:   . Attends Religious  Services:   . Active Member of Clubs or Organizations:   . Attends Banker Meetings:   Marland Kitchen Marital Status:   Intimate Partner Violence:   . Fear of Current or Ex-Partner:   . Emotionally Abused:   Marland Kitchen Physically Abused:   . Sexually Abused:       Family History  Problem Relation Age of Onset  . Heart attack Mother   . Asthma Mother   . Heart failure Mother   . Hypertension Father   . Diabetes Brother     Vitals:   12/06/19 0857  BP: 110/80  Pulse: 84  SpO2: 97%  Weight: (!) 156.1 kg (344 lb 3.2 oz)   Wt Readings from Last 3 Encounters:  12/06/19 (!) 156.1 kg (344 lb 3.2 oz)  11/03/19 (!) 150.6 kg (332 lb)  10/25/19 (!) 156.4 kg (344 lb 12.8 oz)    PHYSICAL EXAM General:  Well appearing. No resp difficulty HEENT: normal Neck: supple. no JVD. Carotids 2+ bilat; no bruits. No lymphadenopathy or thryomegaly appreciated. Cor: PMI nondisplaced. Regular rate & rhythm. No rubs, gallops or murmurs. R upper chest tunneled catheter. Dressing is coming off for milrinone.  Lungs: clear Abdomen: obese. soft, nontender, nondistended. No hepatosplenomegaly. No bruits or masses. Good bowel sounds. Extremities: no cyanosis, clubbing, rash, edema Neuro: alert & orientedx3, cranial nerves grossly intact. moves all 4 extremities w/o difficulty. Affect pleasant  ASSESSMENT & PLAN: 1. Chronic Systolic HF w/ Low Output/ NICM: Echo 06/2019 with EF < 20%, relatively normal RV, moderate MR. NICM with Medtronic ICD. Recent acute exacerbation in the setting of dietary indiscretion w/ sodium. Massive diuresis w/ IV Lasix, ~17 lb. D/c dry wt 357 lb.  RHC 10/5 with low output (CI 1.8) and elevated PCWP but near normal RA pressure, started on milrinone 0.25 w/ plans to continue home milrinone as bridge to possible LVAD if significant weight loss.  - Medtronic - impedance trending down, activity ~ 2 hours per day, No VT - NYHA II. Volume status trending up. Increase morning torsemide to 100 mg  and continue 80 mg in the evening.   -  Continue Coreg 3.125 mg bid.  - Continue Entresto 24/26 bid.  - Continue ivabradine to 7.5 mg twice a day.   - Continue spironolactone 25 daily.  -  Continue digoxin 0.125 mg - VAD consideration: Her size and social support will be issues.  She would not be a transplant candidate at this point due to weight. We discussed her at [facility], Dr. Donata Clay has seen.  She will need significant weight loss to get to LVAD.  Will use home milrinone to try to facilitate increased activity to work towards weight loss.  She understands that milrinone is not a good end-point and that we would like to use it as a bridge to eventual LVAD. RV function seems adequate for LVAD.   2. Morbid obesity:  Body mass index is 57.28 kg/m.  [facility] - Continue to increase activity.   3. SVT: had ED visit 10/11 for SVT noted by EMS that converted back to NSR after dose of adenosine. This was in the setting of hypokalemia and hypomagnesemia, at 3.0 and 1.5 respectively. Also in the setting of inotropic therapy w/ milrinone.   -No further episodes.   4. OSA Stressed importance of nightly use.  - Not using CPAP.   5. DMII Per PCP. 06/17/19 Hgb A1C 9.4  - Consider SGT2I with weight loss.   Follow up in 4 weeks. Continue HF Paramedicine.   Tonye Becket, NP 12/06/19

## 2019-12-06 ENCOUNTER — Other Ambulatory Visit: Payer: Self-pay

## 2019-12-06 ENCOUNTER — Encounter (HOSPITAL_COMMUNITY): Payer: Self-pay

## 2019-12-06 ENCOUNTER — Ambulatory Visit (HOSPITAL_COMMUNITY)
Admission: RE | Admit: 2019-12-06 | Discharge: 2019-12-06 | Disposition: A | Discharge status: Home / Self Care | Payer: Medicaid Other | Source: Ambulatory Visit | Attending: Cardiology | Admitting: Cardiology

## 2019-12-06 VITALS — BP 110/80 | HR 84 | Wt 344.2 lb

## 2019-12-06 DIAGNOSIS — G4730 Sleep apnea, unspecified: Secondary | ICD-10-CM | POA: Diagnosis not present

## 2019-12-06 DIAGNOSIS — G4733 Obstructive sleep apnea (adult) (pediatric): Secondary | ICD-10-CM | POA: Diagnosis not present

## 2019-12-06 DIAGNOSIS — J45909 Unspecified asthma, uncomplicated: Secondary | ICD-10-CM | POA: Insufficient documentation

## 2019-12-06 DIAGNOSIS — H9193 Unspecified hearing loss, bilateral: Secondary | ICD-10-CM | POA: Insufficient documentation

## 2019-12-06 DIAGNOSIS — I471 Supraventricular tachycardia: Secondary | ICD-10-CM | POA: Diagnosis not present

## 2019-12-06 DIAGNOSIS — I428 Other cardiomyopathies: Secondary | ICD-10-CM

## 2019-12-06 DIAGNOSIS — Z87891 Personal history of nicotine dependence: Secondary | ICD-10-CM | POA: Insufficient documentation

## 2019-12-06 DIAGNOSIS — I5022 Chronic systolic (congestive) heart failure: Secondary | ICD-10-CM | POA: Diagnosis not present

## 2019-12-06 DIAGNOSIS — E119 Type 2 diabetes mellitus without complications: Secondary | ICD-10-CM | POA: Insufficient documentation

## 2019-12-06 DIAGNOSIS — I509 Heart failure, unspecified: Secondary | ICD-10-CM | POA: Diagnosis present

## 2019-12-06 DIAGNOSIS — Z8249 Family history of ischemic heart disease and other diseases of the circulatory system: Secondary | ICD-10-CM | POA: Insufficient documentation

## 2019-12-06 DIAGNOSIS — Z833 Family history of diabetes mellitus: Secondary | ICD-10-CM | POA: Diagnosis not present

## 2019-12-06 DIAGNOSIS — Z888 Allergy status to other drugs, medicaments and biological substances status: Secondary | ICD-10-CM | POA: Insufficient documentation

## 2019-12-06 DIAGNOSIS — Z79899 Other long term (current) drug therapy: Secondary | ICD-10-CM | POA: Insufficient documentation

## 2019-12-06 DIAGNOSIS — Z825 Family history of asthma and other chronic lower respiratory diseases: Secondary | ICD-10-CM | POA: Diagnosis not present

## 2019-12-06 DIAGNOSIS — Z95810 Presence of automatic (implantable) cardiac defibrillator: Secondary | ICD-10-CM

## 2019-12-06 DIAGNOSIS — Z6841 Body Mass Index (BMI) 40.0 and over, adult: Secondary | ICD-10-CM | POA: Diagnosis not present

## 2019-12-06 MED ORDER — TORSEMIDE 20 MG PO TABS: ORAL_TABLET | ORAL | 3 refills | Status: DC

## 2019-12-06 NOTE — Progress Notes (Signed)
Katie with paramedicine is aware of medication changes and will assist patient with med change.

## 2019-12-06 NOTE — Progress Notes (Signed)
CSW met with pt during clinic appt to assist with COVID-19 vaccine appt.  CSW able to get pt appt with [facility] drive thru on 2/83 at 4:30pm- texted pt this information and pt will also receive reminders from them directly.  CSW also updated Clinical biochemist regarding todays appt and medication adjustments to increase pt torsemide due to increased fluid level.    CSW will continue to follow and assist as needed  Jorge Ny, [place] [facility] Desk#: [phone] Cell#: [phone]

## 2019-12-06 NOTE — Patient Instructions (Signed)
Increase Torsemide to 100 mg (5 tabs) in AM and 80 mg (4 tabs) in PM  Your physician recommends that you schedule a follow-up appointment in: 4 weeks  If you have any questions or concerns before your next appointment please send [place] a message through [place] or call our office at [phone].  At the Advanced [facility] Failure Clinic, you and your health needs are our priority. As part of our continuing mission to provide you with exceptional heart care, we have created designated Provider Care Teams. These Care Teams include your primary Cardiologist (physician) and Advanced Practice Providers (APPs- Physician Assistants and Nurse Practitioners) who all work together to provide you with the care you need, when you need it.   You may see any of the following providers on your designated Care Team at your next follow up: Marland Kitchen Dr Arvilla Meres . Dr Marca Ancona . Tonye Becket, NP . Robbie Lis, PA . Karle Plumber, PharmD   Please be sure to bring in all your medications bottles to every appointment.

## 2019-12-07 ENCOUNTER — Telehealth (HOSPITAL_COMMUNITY): Payer: Self-pay | Admitting: *Deleted

## 2019-12-07 DIAGNOSIS — Z452 Encounter for adjustment and management of vascular access device: Secondary | ICD-10-CM | POA: Diagnosis not present

## 2019-12-07 DIAGNOSIS — I340 Nonrheumatic mitral (valve) insufficiency: Secondary | ICD-10-CM | POA: Diagnosis not present

## 2019-12-07 DIAGNOSIS — Z79899 Other long term (current) drug therapy: Secondary | ICD-10-CM | POA: Diagnosis not present

## 2019-12-07 DIAGNOSIS — Z7984 Long term (current) use of oral hypoglycemic drugs: Secondary | ICD-10-CM | POA: Diagnosis not present

## 2019-12-07 DIAGNOSIS — I5023 Acute on chronic systolic (congestive) heart failure: Secondary | ICD-10-CM | POA: Diagnosis not present

## 2019-12-07 DIAGNOSIS — E119 Type 2 diabetes mellitus without complications: Secondary | ICD-10-CM | POA: Diagnosis not present

## 2019-12-07 DIAGNOSIS — J45909 Unspecified asthma, uncomplicated: Secondary | ICD-10-CM | POA: Diagnosis not present

## 2019-12-07 DIAGNOSIS — Z6841 Body Mass Index (BMI) 40.0 and over, adult: Secondary | ICD-10-CM | POA: Diagnosis not present

## 2019-12-07 DIAGNOSIS — O903 Peripartum cardiomyopathy: Secondary | ICD-10-CM | POA: Diagnosis not present

## 2019-12-07 DIAGNOSIS — Z5181 Encounter for therapeutic drug level monitoring: Secondary | ICD-10-CM | POA: Diagnosis not present

## 2019-12-07 DIAGNOSIS — F17210 Nicotine dependence, cigarettes, uncomplicated: Secondary | ICD-10-CM | POA: Diagnosis not present

## 2019-12-07 NOTE — Telephone Encounter (Signed)
Patient was supposed to meet at [facility] Monday 3/22 9am walk prior to her appointment at 10:30 in HF clinic. She had mistaken her appointment time and was actually scheduled for 8:30am with [name] Clegg. I met with her after her appointment to offer a walking session then, and she was unavailable to stay to walk and said she would come back in the afternoon to walk with [place], CSW as I would have scheduling conflicts in the afternoon. She canceled her walking session with [place].   Today, she canceled her walking session that was scheduled with me for Dec 29, 2022 at 8:30am due to death of a family friend.   We have a walking session scheduled 8am on Thursday. I reminded her of this via email. RCEP/HWC will continue to follow.    Landis Martins, MS, ACSM-RCEP Clinical Exercise Physiologist/ Health and Wellness Coach

## 2019-12-09 ENCOUNTER — Encounter (HOSPITAL_COMMUNITY): Payer: Self-pay | Admitting: *Deleted

## 2019-12-09 ENCOUNTER — Telehealth (HOSPITAL_COMMUNITY): Payer: Self-pay | Admitting: *Deleted

## 2019-12-09 ENCOUNTER — Other Ambulatory Visit (HOSPITAL_COMMUNITY): Payer: Self-pay

## 2019-12-09 ENCOUNTER — Telehealth (HOSPITAL_COMMUNITY): Payer: Self-pay | Admitting: Licensed Clinical Social Worker

## 2019-12-09 NOTE — Telephone Encounter (Signed)
Deanna with [facility] called to report patients critical potassium of 2.2. Pt told Deanna she has not taken her potassium in a week but has it in the home. Kristi with [facility] also called and left a vm stating patient told her she had not picked up her potassium from the pharmacy yet and has been without it for a week. [facility] also notified our clinic social worker Eileen Stanford this morning.   Routed to Dr.McLean

## 2019-12-09 NOTE — Telephone Encounter (Signed)
CSW met with pt following Exercise Physiologist appt to discuss medication noncompliance concerns.  CSW had been informed pt K was 2.2 which indicated she has not been taking her potassium.  CSW addressed with patient who confirms she has missed several doses of her nighttime potassium.  Pt reports she takes all her other nighttime medications but leaves the potassium to take until later.  CSW encouraged pt to take her potassium alongside all her other nighttime medications since she reportedly doesn't forget those.    CSW reiterated importance of taking potassium and that low potassium puts her at risk for a sudden cardiac event.  Explained that pt continuing to show that she is not following instructions could jeopardize her being approved for an LVAD later on due to the importance of compliance in being successful with LVAD implantation.  Pt expressed understanding and intention of being better about medication compliance moving forward.  CSW will continue to follow and assist as needed  Jorge Ny, [place] [facility] Desk#: [phone] Cell#: [phone]

## 2019-12-09 NOTE — Progress Notes (Addendum)
Patient arrived for walking session at [facility] on [facility]walk today. She was very emotional this morning due to family events the last couple of days. We spent the majority of the walking/coaching session on those particular topics as that was where she needed her focus today. She said she showed up because she needed to walk for her mental health. She walked 1.5 miles in 35 minutes- which is slower than her normal pace, but she was very emotional during the walk today. She met with CSW, [name] after the walk and discussed her challenges and frustrations with her as well. We planned for a check-in call tomorrow and to plan walking sessions next week.   Time spent with patient face-to-face: 40 minutes   Kristen Thomas, MS, ACSM-RCEP Clinical Exercise Physiologist/ Health and Wellness Coach    

## 2019-12-09 NOTE — Telephone Encounter (Signed)
She needs to go back on KCl 60 tid asap with BMET Monday.

## 2019-12-09 NOTE — Progress Notes (Signed)
Paramedicine Encounter    [name], female    DOB: 08-12-1981, 39 y.o.   MRN: [id number]   Patient Care Team: System, Provider Not In as PCP - General Regan Lemming, MD as PCP - Electrophysiology (Cardiology) Laurey Morale, MD as PCP - Advanced Heart Failure (Cardiology)  Patient Active Problem List   Diagnosis Date Noted  . Acute on chronic systolic CHF (congestive heart failure) (HCC) 06/17/2019  . Obstructive sleep apnea 06/17/2019  . 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 Diabetes mellitus (HCC) 06/17/2019  . Amenorrhea 08/03/2018  . Congestive heart failure (HCC) 06/29/2018  . Hypotension 06/07/2018  . Rhinovirus infection 06/06/2018  . Asthma 06/05/2018  . Acute respiratory failure with hypoxia (HCC) 06/05/2018  . Hypokalemia 06/05/2018  . Snoring 09/23/2016  . Cough 04/10/2016  . 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 Tobacco abuse 01/25/2016  . Acute on chronic combined systolic and diastolic CHF (congestive heart failure) (HCC) 01/25/2016  . Mitral regurgitation   . Abnormal EKG-inf TWI 01/15/2016  . Positive D dimer-CTA neg for PE 01/15/2016  . Morbid obesity- BMI 57 01/14/2016  . Abdominal pain 01/14/2016  . 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 At risk for sleep apnea 01/14/2016  . Cardiomyopathy (HCC) 11/01/2015  . Morbid obesity with BMI of 50.0-59.9, adult (HCC) 08/16/2015    Current Outpatient Medications:  .  atorvastatin (LIPITOR) 10 MG tablet, Take 1 tablet (10 mg total) by mouth daily. (Patient taking differently: Take 10 mg by mouth daily. ), Disp: 90 tablet, Rfl: 3 .  carvedilol (COREG) 6.25 MG tablet, Take 1 tablet (6.25 mg total) by mouth 2 (two) times daily., Disp: 180 tablet, Rfl: 3 .  digoxin (LANOXIN) 0.125 MG tablet, Take 1 tablet (0.125 mg total) by mouth daily. (Patient taking differently: Take 0.125 mg by mouth daily. ), Disp: 90 tablet, Rfl: 3 .  ivabradine (CORLANOR) 7.5  MG TABS tablet, Take 1 tablet (7.5 mg total) by mouth 2 (two) times daily with a meal., Disp: 60 tablet, Rfl: 3 .  Magnesium Oxide 400 (240 Mg) MG TABS, Take 2 tablets by mouth every morning and 1 tablet by mouth every evening, Disp: 90 tablet, Rfl: 6 .  metolazone (ZAROXOLYN) 2.5 MG tablet, TAKE 1 TABLET(2.5 MG) BY MOUTH 1 TIME A WEEK (Patient taking differently: TAKE 1 TABLET(2.5 MG) BY MOUTH 1 TIME A WEEK), Disp: 10 tablet, Rfl: 3 .  milrinone (PRIMACOR) 20 MG/100 ML SOLN infusion, Inject 0.0414 mg/min into the vein continuous., Disp:  , Rfl:  .  potassium chloride (KLOR-CON) 10 MEQ tablet, Take 60 mEq by mouth 3 (three) times daily., Disp: , Rfl:  .  PRESCRIPTION MEDICATION, CPAP- AT BEDTIME, Disp: , Rfl:  .  sacubitril-valsartan (ENTRESTO) 24-26 MG, Take 1 tablet by mouth 2 (two) times daily., Disp: 60 tablet, Rfl: 5 .  spironolactone (ALDACTONE) 25 MG tablet, Take 1 tablet (25 mg total) by mouth daily., Disp: 90 tablet, Rfl: 3 .  torsemide (DEMADEX) 20 MG tablet, Take 5 tablets (100 mg total) by mouth in the morning AND 4 tablets (80 mg total) every evening., Disp: 720 tablet, Rfl: 3 .  albuterol (PROAIR HFA) 108 (90 Base) MCG/ACT inhaler, Inhale 2 puffs into the lungs every 6 (six) hours as needed for wheezing or shortness of breath., Disp: , Rfl:  .  ipratropium-albuterol (DUONEB) 0.5-2.5 (3) MG/3ML SOLN, Take 3 mLs by nebulization every 4 (four) hours as needed. (Patient not taking: Reported on 12/09/2019), Disp: 360 mL, Rfl:  0 .  ondansetron (ZOFRAN) 4 MG tablet, Take 1 tablet (4 mg total) by mouth every 8 (eight) hours as needed for nausea or vomiting. (Patient not taking: Reported on 12/09/2019), Disp: 20 tablet, Rfl: 0 .  topiramate (TOPAMAX) 25 MG tablet, Take by mouth., Disp: , Rfl:  Allergies  Allergen Reactions  . Metformin And Related Diarrhea    And caused severe "dry mouth," also      Social History   Socioeconomic History  . Marital status: Single    Spouse name: Not on  file  . Number of children: 2  . Years of education: Not on file  . Highest education level: Not on file  Occupational History  . Not on file  Tobacco Use  . Smoking status: Former Smoker    Quit date: 10/19/2015    Years since quitting: 4.1  . Smokeless tobacco: Never Used  Substance and Sexual Activity  . Alcohol use: Yes    Alcohol/week: 0.0 standard drinks    Comment: occasionally  . Drug use: Yes    Types: Marijuana    Comment: only socially  . Sexual activity: Yes  Other Topics Concern  . Not on file  Social History Narrative  . Not on file   Social Determinants of Health   Financial Resource Strain: High Risk  . Difficulty of Paying Living Expenses: Hard  Food Insecurity: Food Insecurity Present  . Worried About Charity fundraiser in the Last Year: Sometimes true  . Ran Out of Food in the Last Year: Sometimes true  Transportation Needs: No Transportation Needs  . Lack of Transportation (Medical): No  . Lack of Transportation (Non-Medical): No  Physical Activity:   . Days of Exercise per Week:   . Minutes of Exercise per Session:   Stress:   . Feeling of Stress :   Social Connections:   . Frequency of Communication with Friends and Family:   . Frequency of Social Gatherings with Friends and Family:   . Attends Religious Services:   . Active Member of Clubs or Organizations:   . Attends Archivist Meetings:   Marland Kitchen Marital Status:   Intimate Partner Violence:   . Fear of Current or Ex-Partner:   . Emotionally Abused:   Marland Kitchen Physically Abused:   . Sexually Abused:     Physical Exam      Future Appointments  Date Time Provider [place]  01/04/2020  2:00 PM [facility] PA/NP [facility] None  01/24/2020  7:45 AM [facility] DEVICE REMOTES [facility]  04/24/2020  7:45 AM [facility] DEVICE REMOTES [facility]  07/24/2020  7:45 AM [facility] DEVICE REMOTES [facility]    BP (!) 100/0   Pulse 88   Temp 99.3 F  (37.4 C)   Resp 18   SpO2 97%   Weight today-345 Weight yesterday-345 Last visit weight-347   F/u this week post clinic visit on Monday.  Home health reported lab results critical low this week from labs on Tuesday.  She has a bunch of potassium pills loose in her basket of meds. She said she was extra busy this week but knows its just and excuse and will do better with taking meds. She said she got home late a lot this past week and just didn't take it.  --total of 7 doses of potassium missed meds verified and pill box refilled.    Refills needed--corlanor  [name], [place] Paramedic  12/09/19

## 2019-12-13 ENCOUNTER — Other Ambulatory Visit (HOSPITAL_COMMUNITY): Payer: Self-pay | Admitting: Cardiology

## 2019-12-14 ENCOUNTER — Telehealth (HOSPITAL_COMMUNITY): Payer: Self-pay | Admitting: Cardiology

## 2019-12-14 DIAGNOSIS — Z7984 Long term (current) use of oral hypoglycemic drugs: Secondary | ICD-10-CM | POA: Diagnosis not present

## 2019-12-14 DIAGNOSIS — Z5181 Encounter for therapeutic drug level monitoring: Secondary | ICD-10-CM | POA: Diagnosis not present

## 2019-12-14 DIAGNOSIS — Z452 Encounter for adjustment and management of vascular access device: Secondary | ICD-10-CM | POA: Diagnosis not present

## 2019-12-14 DIAGNOSIS — Z79899 Other long term (current) drug therapy: Secondary | ICD-10-CM | POA: Diagnosis not present

## 2019-12-14 DIAGNOSIS — J45909 Unspecified asthma, uncomplicated: Secondary | ICD-10-CM | POA: Diagnosis not present

## 2019-12-14 DIAGNOSIS — I340 Nonrheumatic mitral (valve) insufficiency: Secondary | ICD-10-CM | POA: Diagnosis not present

## 2019-12-14 DIAGNOSIS — I5023 Acute on chronic systolic (congestive) heart failure: Secondary | ICD-10-CM | POA: Diagnosis not present

## 2019-12-14 DIAGNOSIS — Z6841 Body Mass Index (BMI) 40.0 and over, adult: Secondary | ICD-10-CM | POA: Diagnosis not present

## 2019-12-14 DIAGNOSIS — O903 Peripartum cardiomyopathy: Secondary | ICD-10-CM | POA: Diagnosis not present

## 2019-12-14 DIAGNOSIS — F17210 Nicotine dependence, cigarettes, uncomplicated: Secondary | ICD-10-CM | POA: Diagnosis not present

## 2019-12-14 DIAGNOSIS — E119 Type 2 diabetes mellitus without complications: Secondary | ICD-10-CM | POA: Diagnosis not present
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
## 2019-12-16 ENCOUNTER — Other Ambulatory Visit (HOSPITAL_COMMUNITY): Payer: Self-pay

## 2019-12-16 NOTE — Progress Notes (Signed)
We had scheduled a visit for this morning at 9 for a home visit. I told her to please let me know if that needs to be changed. When I arrived at our time for home visit she was not home, nobody came to door and her car was gone and no response when I texted her.  Will try again next week.   Kerry Hough, EMT-Paramedic  12/16/19

## 2019-12-17 ENCOUNTER — Telehealth (HOSPITAL_COMMUNITY): Payer: Self-pay | Admitting: *Deleted

## 2019-12-17 NOTE — Telephone Encounter (Signed)
Attempted to contact [place] several times since we last met last Thursday 3/25. No answer or returned calls. She has been communicating with [place], CSW. Will try again next week.    Landis Martins, MS, ACSM, [facility] Clinical Exercise Physiologist/ Health and Wellness Coach

## 2019-12-20 ENCOUNTER — Telehealth (HOSPITAL_COMMUNITY): Payer: Self-pay

## 2019-12-20 ENCOUNTER — Other Ambulatory Visit (HOSPITAL_COMMUNITY): Payer: Self-pay

## 2019-12-20 MED ORDER — POTASSIUM CHLORIDE CRYS ER 10 MEQ PO TBCR: EXTENDED_RELEASE_TABLET | ORAL | 5 refills | Status: DC

## 2019-12-20 NOTE — Telephone Encounter (Signed)
-----   Message from Laurey Morale, MD sent at 12/17/2019 11:20 PM EDT ----- Increase total daily KCl by 20 mEq, BMET 10 days.

## 2019-12-20 NOTE — Telephone Encounter (Signed)
Spoke with Scientist, research (physical sciences). She will let patient know of changes.  PT has HH  visits on tuesdays. She will have labs drawn at next Tuesday.

## 2019-12-20 NOTE — Progress Notes (Signed)
Paramedicine Encounter    [name], female    DOB: [birth year], 39 y.o.   MRN: [id number]   Patient Care Team: System, Provider Not In as PCP - General Regan Lemming, MD as PCP - Electrophysiology (Cardiology) Laurey Morale, MD as PCP - Advanced Heart Failure (Cardiology)  Patient Active Problem List   Diagnosis Date Noted  . Acute on chronic systolic CHF (congestive heart failure) (HCC) 06/17/2019  . Obstructive sleep apnea 06/17/2019  . 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 Diabetes mellitus (HCC) 06/17/2019  . Amenorrhea 08/03/2018  . Congestive heart failure (HCC) 06/29/2018  . Hypotension 06/07/2018  . Rhinovirus infection 06/06/2018  . Asthma 06/05/2018  . Acute respiratory failure with hypoxia (HCC) 06/05/2018  . Hypokalemia 06/05/2018  . Snoring 09/23/2016  . Cough 04/10/2016  . 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 Tobacco abuse 01/25/2016  . Acute on chronic combined systolic and diastolic CHF (congestive heart failure) (HCC) 01/25/2016  . Mitral regurgitation   . Abnormal EKG-inf TWI 01/15/2016  . Positive D dimer-CTA neg for PE 01/15/2016  . Morbid obesity- BMI 57 01/14/2016  . Abdominal pain 01/14/2016  . 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 At risk for sleep apnea 01/14/2016  . Cardiomyopathy (HCC) 11/01/2015  . Morbid obesity with BMI of 50.0-59.9, adult (HCC) 08/16/2015    Current Outpatient Medications:  .  albuterol (PROAIR HFA) 108 (90 Base) MCG/ACT inhaler, Inhale 2 puffs into the lungs every 6 (six) hours as needed for wheezing or shortness of breath., Disp: , Rfl:  .  atorvastatin (LIPITOR) 10 MG tablet, Take 1 tablet (10 mg total) by mouth daily. (Patient taking differently: Take 10 mg by mouth daily. ), Disp: 90 tablet, Rfl: 3 .  carvedilol (COREG) 6.25 MG tablet, Take 1 tablet (6.25 mg total) by mouth 2 (two) times daily., Disp: 180 tablet, Rfl: 3 .  digoxin (LANOXIN) 0.125 MG  tablet, Take 1 tablet (0.125 mg total) by mouth daily. (Patient taking differently: Take 0.125 mg by mouth daily. ), Disp: 90 tablet, Rfl: 3 .  ivabradine (CORLANOR) 7.5 MG TABS tablet, Take 1 tablet (7.5 mg total) by mouth 2 (two) times daily with a meal., Disp: 60 tablet, Rfl: 3 .  Magnesium Oxide 400 (240 Mg) MG TABS, Take 2 tablets by mouth every morning and 1 tablet by mouth every evening, Disp: 90 tablet, Rfl: 6 .  metolazone (ZAROXOLYN) 2.5 MG tablet, TAKE 1 TABLET(2.5 MG) BY MOUTH 1 TIME A WEEK (Patient taking differently: TAKE 1 TABLET(2.5 MG) BY MOUTH 1 TIME A WEEK), Disp: 10 tablet, Rfl: 3 .  milrinone (PRIMACOR) 20 MG/100 ML SOLN infusion, Inject 0.0414 mg/min into the vein continuous., Disp:  , Rfl:  .  potassium chloride (KLOR-CON) 10 MEQ tablet, Take 80 meq (8 tabs) in the morning, 60 meq (6 tabs) in the afternoon, and 60 meq (6 tabs) in the evening, Disp: 600 tablet, Rfl: 5 .  PRESCRIPTION MEDICATION, CPAP- AT BEDTIME, Disp: , Rfl:  .  sacubitril-valsartan (ENTRESTO) 24-26 MG, Take 1 tablet by mouth 2 (two) times daily., Disp: 60 tablet, Rfl: 5 .  spironolactone (ALDACTONE) 25 MG tablet, Take 1 tablet (25 mg total) by mouth daily. (Patient taking differently: Take 25 mg by mouth daily. ), Disp: 90 tablet, Rfl: 3 .  torsemide (DEMADEX) 20 MG tablet, Take 5 tablets (100 mg total) by mouth in the morning AND 4 tablets (80 mg total) every evening., Disp: 720 tablet, Rfl: 3 .  ipratropium-albuterol (DUONEB)  0.5-2.5 (3) MG/3ML SOLN, Take 3 mLs by nebulization every 4 (four) hours as needed. (Patient not taking: Reported on 12/09/2019), Disp: 360 mL, Rfl: 0 .  ondansetron (ZOFRAN) 4 MG tablet, Take 1 tablet (4 mg total) by mouth every 8 (eight) hours as needed for nausea or vomiting. (Patient not taking: Reported on 12/09/2019), Disp: 20 tablet, Rfl: 0 .  topiramate (TOPAMAX) 25 MG tablet, Take by mouth., Disp: , Rfl:  Allergies  Allergen Reactions  . Metformin And Related Diarrhea    And  caused severe "dry mouth," also      Social History   Socioeconomic History  . Marital status: Single    Spouse name: Not on file  . Number of children: 2  . Years of education: Not on file  . Highest education level: Not on file  Occupational History  . Not on file  Tobacco Use  . Smoking status: Former Smoker    Quit date: 10/19/2015    Years since quitting: 4.1  . Smokeless tobacco: Never Used  Substance and Sexual Activity  . Alcohol use: Yes    Alcohol/week: 0.0 standard drinks    Comment: occasionally  . Drug use: Yes    Types: Marijuana    Comment: only socially  . Sexual activity: Yes  Other Topics Concern  . Not on file  Social History Narrative  . Not on file   Social Determinants of Health   Financial Resource Strain: High Risk  . Difficulty of Paying Living Expenses: Hard  Food Insecurity: Food Insecurity Present  . Worried About Programme researcher, broadcasting/film/video in the Last Year: Sometimes true  . Ran Out of Food in the Last Year: Sometimes true  Transportation Needs: No Transportation Needs  . Lack of Transportation (Medical): No  . Lack of Transportation (Non-Medical): No  Physical Activity:   . Days of Exercise per Week:   . Minutes of Exercise per Session:   Stress:   . Feeling of Stress :   Social Connections:   . Frequency of Communication with Friends and Family:   . Frequency of Social Gatherings with Friends and Family:   . Attends Religious Services:   . Active Member of Clubs or Organizations:   . Attends Banker Meetings:   Marland Kitchen Marital Status:   Intimate Partner Violence:   . Fear of Current or Ex-Partner:   . Emotionally Abused:   Marland Kitchen Physically Abused:   . Sexually Abused:     Physical Exam      Future Appointments  Date Time Provider Department Center  01/04/2020  2:00 PM [facility] PA/NP [facility] None  01/24/2020  7:45 AM [facility] DEVICE REMOTES [facility]  04/24/2020  7:45 AM [facility] DEVICE REMOTES [facility]  07/24/2020  7:45 AM [facility] DEVICE REMOTES [facility]    BP (!) 94/0   Pulse 92   Temp 98.3 F (36.8 C)   Resp 18   SpO2 98%   Weight today-didn't weigh today Weight yesterday-345 Last visit weight-345  Pt reports she is feeling ok.  Her K was still a tad low this past week. She denies missing any doses of her K this past week.  She did not weigh this morning.  She denies increased sob. No dizziness. No c/p.  She did admit a few wks ago she got side tracked and just didn't care but now she feels more like she is getting back on track and wants to follow regimen.  She reports she had  a fall a few days ago-she missed the bottom steps to stairs and she fell on her knees and sprained her ankle. She reports it was swollen pretty bad but its gone away.  --she needs to place corlanor in sat am and pm doses --she also needs byrudeon  meds verified and pill box refilled.  She did consider starting the topamax, but with his unstable potassium levels lately and the risk of that med dropping her potassium we will not start that right now. Maybe once her potassium gets more stable and she is more consistent with taking her meds will reconsider.    [name], [facility] Paramedic  12/20/19

## 2019-12-21 ENCOUNTER — Other Ambulatory Visit (HOSPITAL_COMMUNITY): Payer: Self-pay | Admitting: *Deleted

## 2019-12-21 DIAGNOSIS — F17210 Nicotine dependence, cigarettes, uncomplicated: Secondary | ICD-10-CM | POA: Diagnosis not present

## 2019-12-21 DIAGNOSIS — O903 Peripartum cardiomyopathy: Secondary | ICD-10-CM | POA: Diagnosis not present

## 2019-12-21 DIAGNOSIS — E119 Type 2 diabetes mellitus without complications: Secondary | ICD-10-CM | POA: Diagnosis not present

## 2019-12-21 DIAGNOSIS — Z79899 Other long term (current) drug therapy: Secondary | ICD-10-CM | POA: Diagnosis not present

## 2019-12-21 DIAGNOSIS — J45909 Unspecified asthma, uncomplicated: Secondary | ICD-10-CM | POA: Diagnosis not present

## 2019-12-21 DIAGNOSIS — Z6841 Body Mass Index (BMI) 40.0 and over, adult: Secondary | ICD-10-CM | POA: Diagnosis not present

## 2019-12-21 DIAGNOSIS — I340 Nonrheumatic mitral (valve) insufficiency: Secondary | ICD-10-CM | POA: Diagnosis not present

## 2019-12-21 DIAGNOSIS — Z452 Encounter for adjustment and management of vascular access device: Secondary | ICD-10-CM | POA: Diagnosis not present

## 2019-12-21 DIAGNOSIS — I5023 Acute on chronic systolic (congestive) heart failure: Secondary | ICD-10-CM | POA: Diagnosis not present

## 2019-12-21 DIAGNOSIS — Z5181 Encounter for therapeutic drug level monitoring: Secondary | ICD-10-CM | POA: Diagnosis not present

## 2019-12-21 DIAGNOSIS — Z7984 Long term (current) use of oral hypoglycemic drugs: Secondary | ICD-10-CM | POA: Diagnosis not present

## 2019-12-21 DIAGNOSIS — O0903 Supervision of pregnancy with history of infertility, third trimester: Secondary | ICD-10-CM | POA: Diagnosis not present

## 2019-12-21 MED ORDER — POTASSIUM CHLORIDE CRYS ER 10 MEQ PO TBCR: EXTENDED_RELEASE_TABLET | ORAL | 5 refills | Status: DC

## 2019-12-21 MED ORDER — IVABRADINE HCL 7.5 MG PO TABS: 7.5000 mg | ORAL_TABLET | Freq: Two times a day (BID) | ORAL | 3 refills | Status: DC

## 2019-12-24 DIAGNOSIS — Z5181 Encounter for therapeutic drug level monitoring: Secondary | ICD-10-CM | POA: Diagnosis not present

## 2019-12-24 DIAGNOSIS — Z7984 Long term (current) use of oral hypoglycemic drugs: Secondary | ICD-10-CM | POA: Diagnosis not present

## 2019-12-24 DIAGNOSIS — F17210 Nicotine dependence, cigarettes, uncomplicated: Secondary | ICD-10-CM | POA: Diagnosis not present

## 2019-12-24 DIAGNOSIS — Z79899 Other long term (current) drug therapy: Secondary | ICD-10-CM | POA: Diagnosis not present

## 2019-12-24 DIAGNOSIS — Z6841 Body Mass Index (BMI) 40.0 and over, adult: Secondary | ICD-10-CM | POA: Diagnosis not present

## 2019-12-24 DIAGNOSIS — J45909 Unspecified asthma, uncomplicated: Secondary | ICD-10-CM | POA: Diagnosis not present

## 2019-12-24 DIAGNOSIS — E119 Type 2 diabetes mellitus without complications: Secondary | ICD-10-CM | POA: Diagnosis not present

## 2019-12-24 DIAGNOSIS — I5023 Acute on chronic systolic (congestive) heart failure: Secondary | ICD-10-CM | POA: Diagnosis not present

## 2019-12-24 DIAGNOSIS — Z452 Encounter for adjustment and management of vascular access device: Secondary | ICD-10-CM | POA: Diagnosis not present

## 2019-12-24 DIAGNOSIS — O903 Peripartum cardiomyopathy: Secondary | ICD-10-CM | POA: Diagnosis not present

## 2019-12-24 DIAGNOSIS — I340 Nonrheumatic mitral (valve) insufficiency: Secondary | ICD-10-CM | POA: Diagnosis not present

## 2019-12-25 IMAGING — DX DG CHEST 2V
2 series · 2 of 2 positions shown · non-contrast
Comparison: 06/08/2018.  Chest CT, 06/05/2018.

CLINICAL DATA: SOB, cough for 5 days - also having ankle swelling,
abd distension - pt states recent congestion issues - hx of CHF,
asthma, mitral regurgitation

EXAM:
CHEST - 2 VIEW

[chest pa]
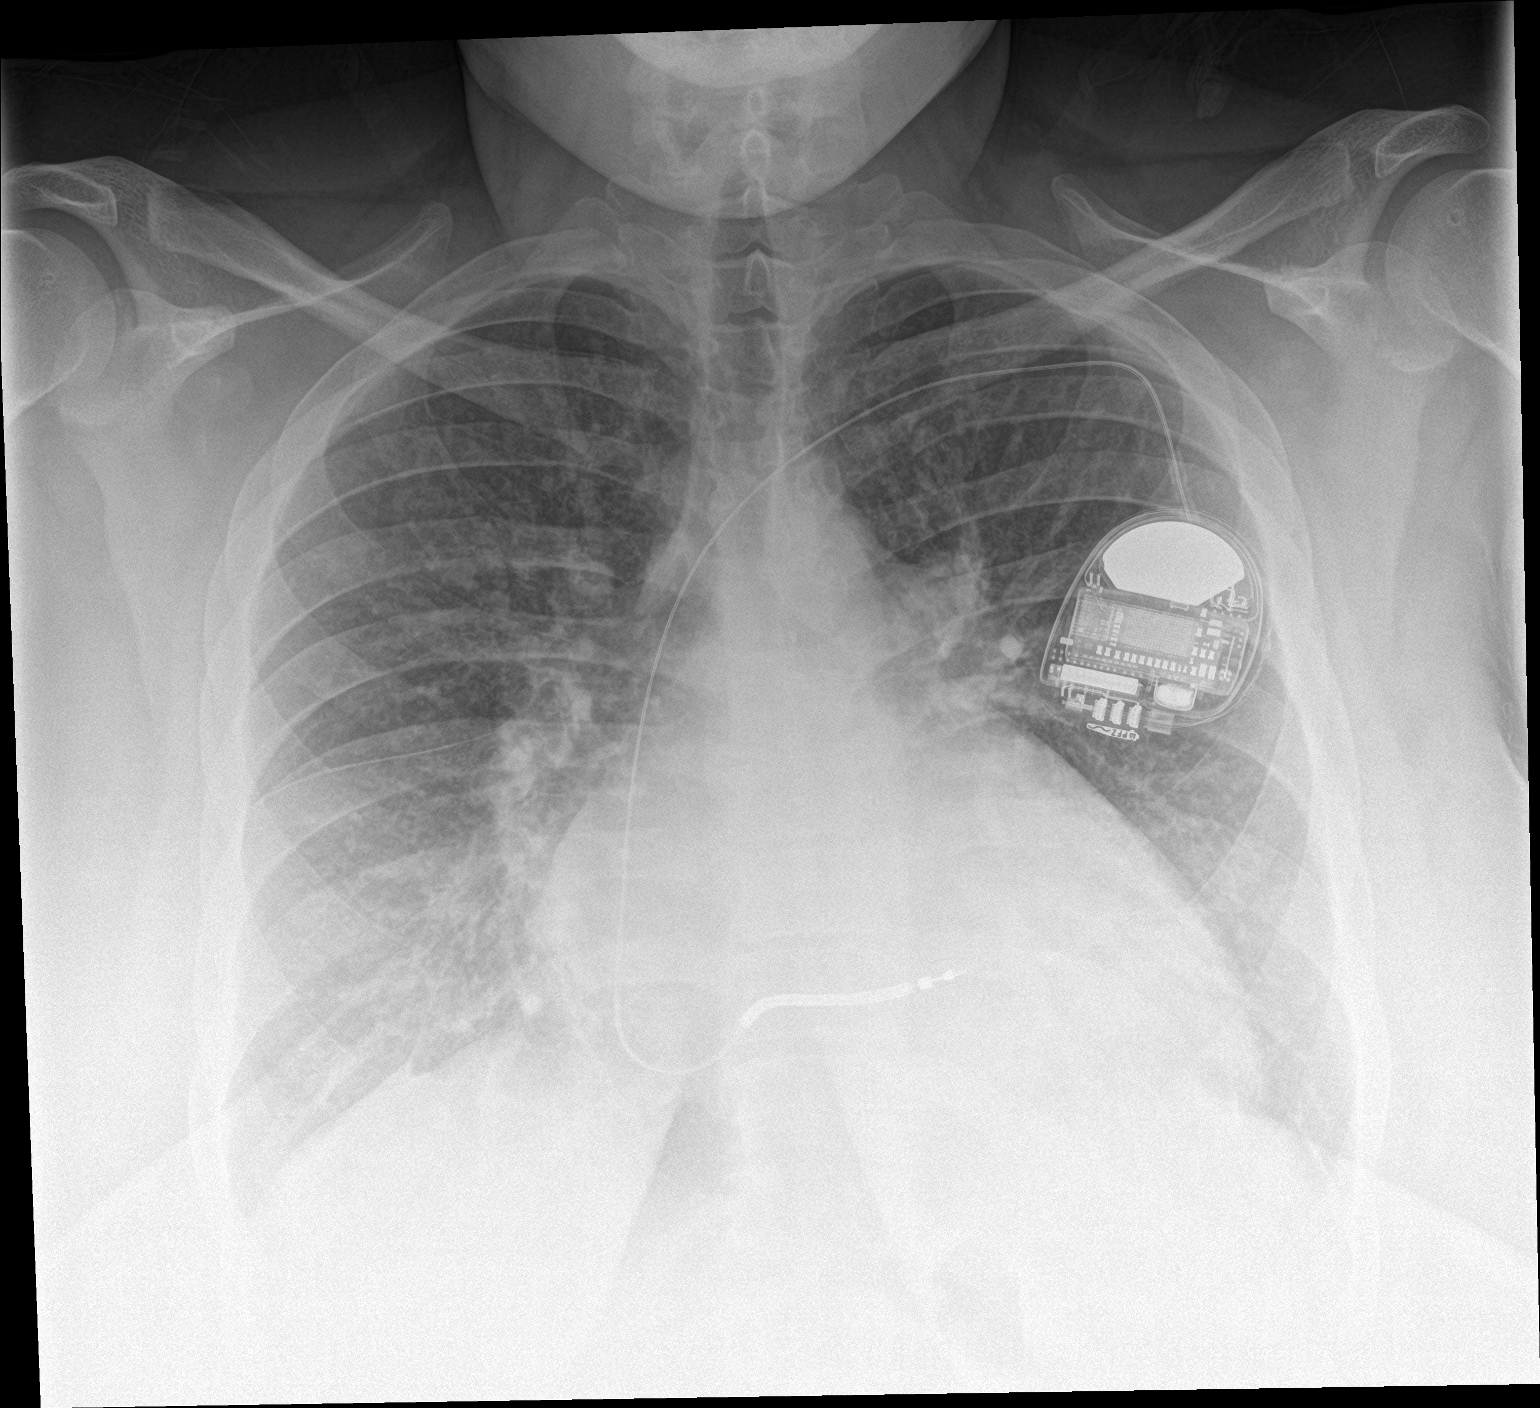

[chest lat]
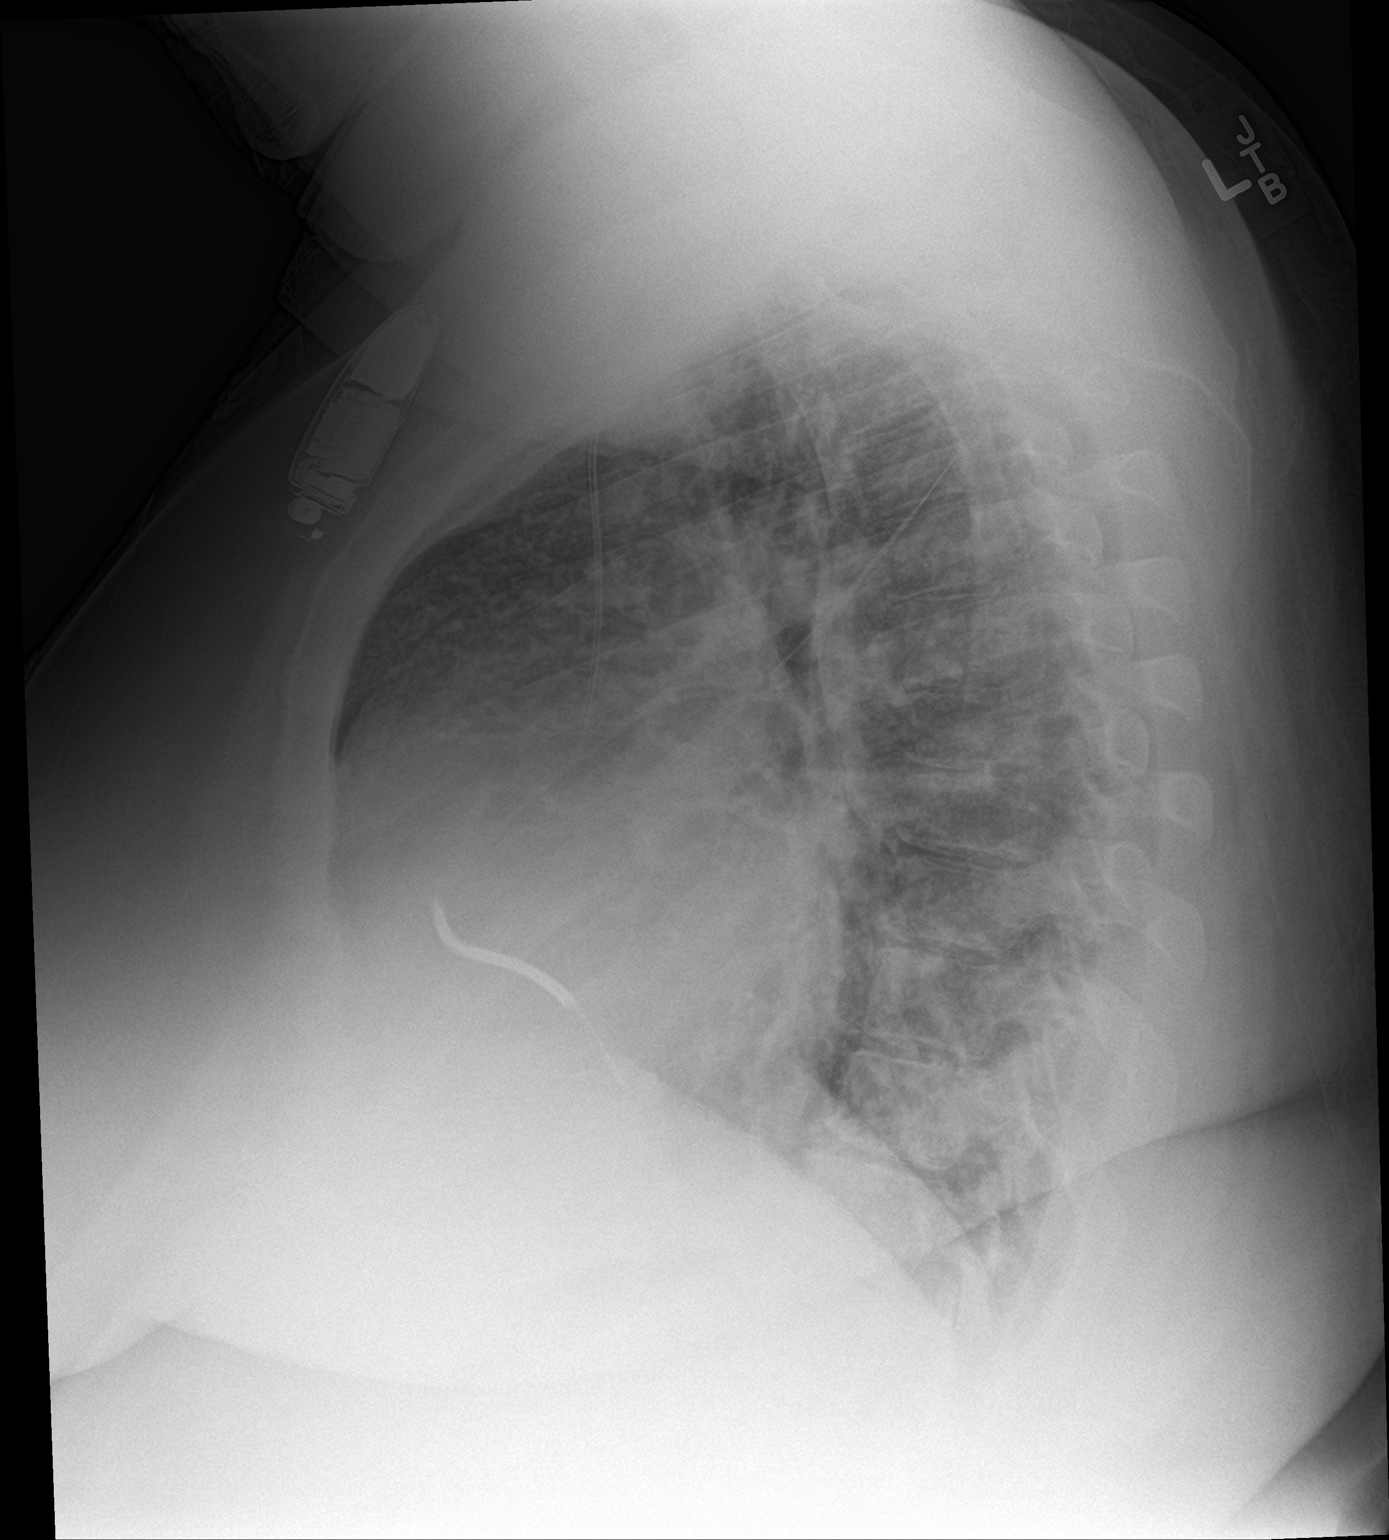

[2 of 2 positions shown; findings below may reference images not displayed]

FINDINGS: Moderate enlargement of the cardiopericardial silhouette, stable. No
mediastinal or hilar masses. No evidence of adenopathy.

There are prominent bronchovascular markings and mild interstitial
thickening which is similar to the prior chest radiographs. No lung
consolidation. No pleural effusion or pneumothorax.

Stable left anterior chest wall single lead ICD.

Skeletal structures are intact.
IMPRESSION: 1. Cardiomegaly with mild interstitial thickening similar to the
prior study. Suspect mild congestive heart failure.

## 2019-12-27 ENCOUNTER — Other Ambulatory Visit (HOSPITAL_COMMUNITY): Payer: Self-pay | Admitting: Cardiology

## 2019-12-27 ENCOUNTER — Encounter (HOSPITAL_COMMUNITY): Payer: Medicaid Other | Admitting: Cardiology

## 2019-12-27 IMAGING — DX DG CHEST 2V
2 series · 2 of 2 positions shown · non-contrast
Comparison: 06/17/2019

CLINICAL DATA: Dyspnea and cough.

EXAM:
CHEST - 2 VIEW

[chest pa]
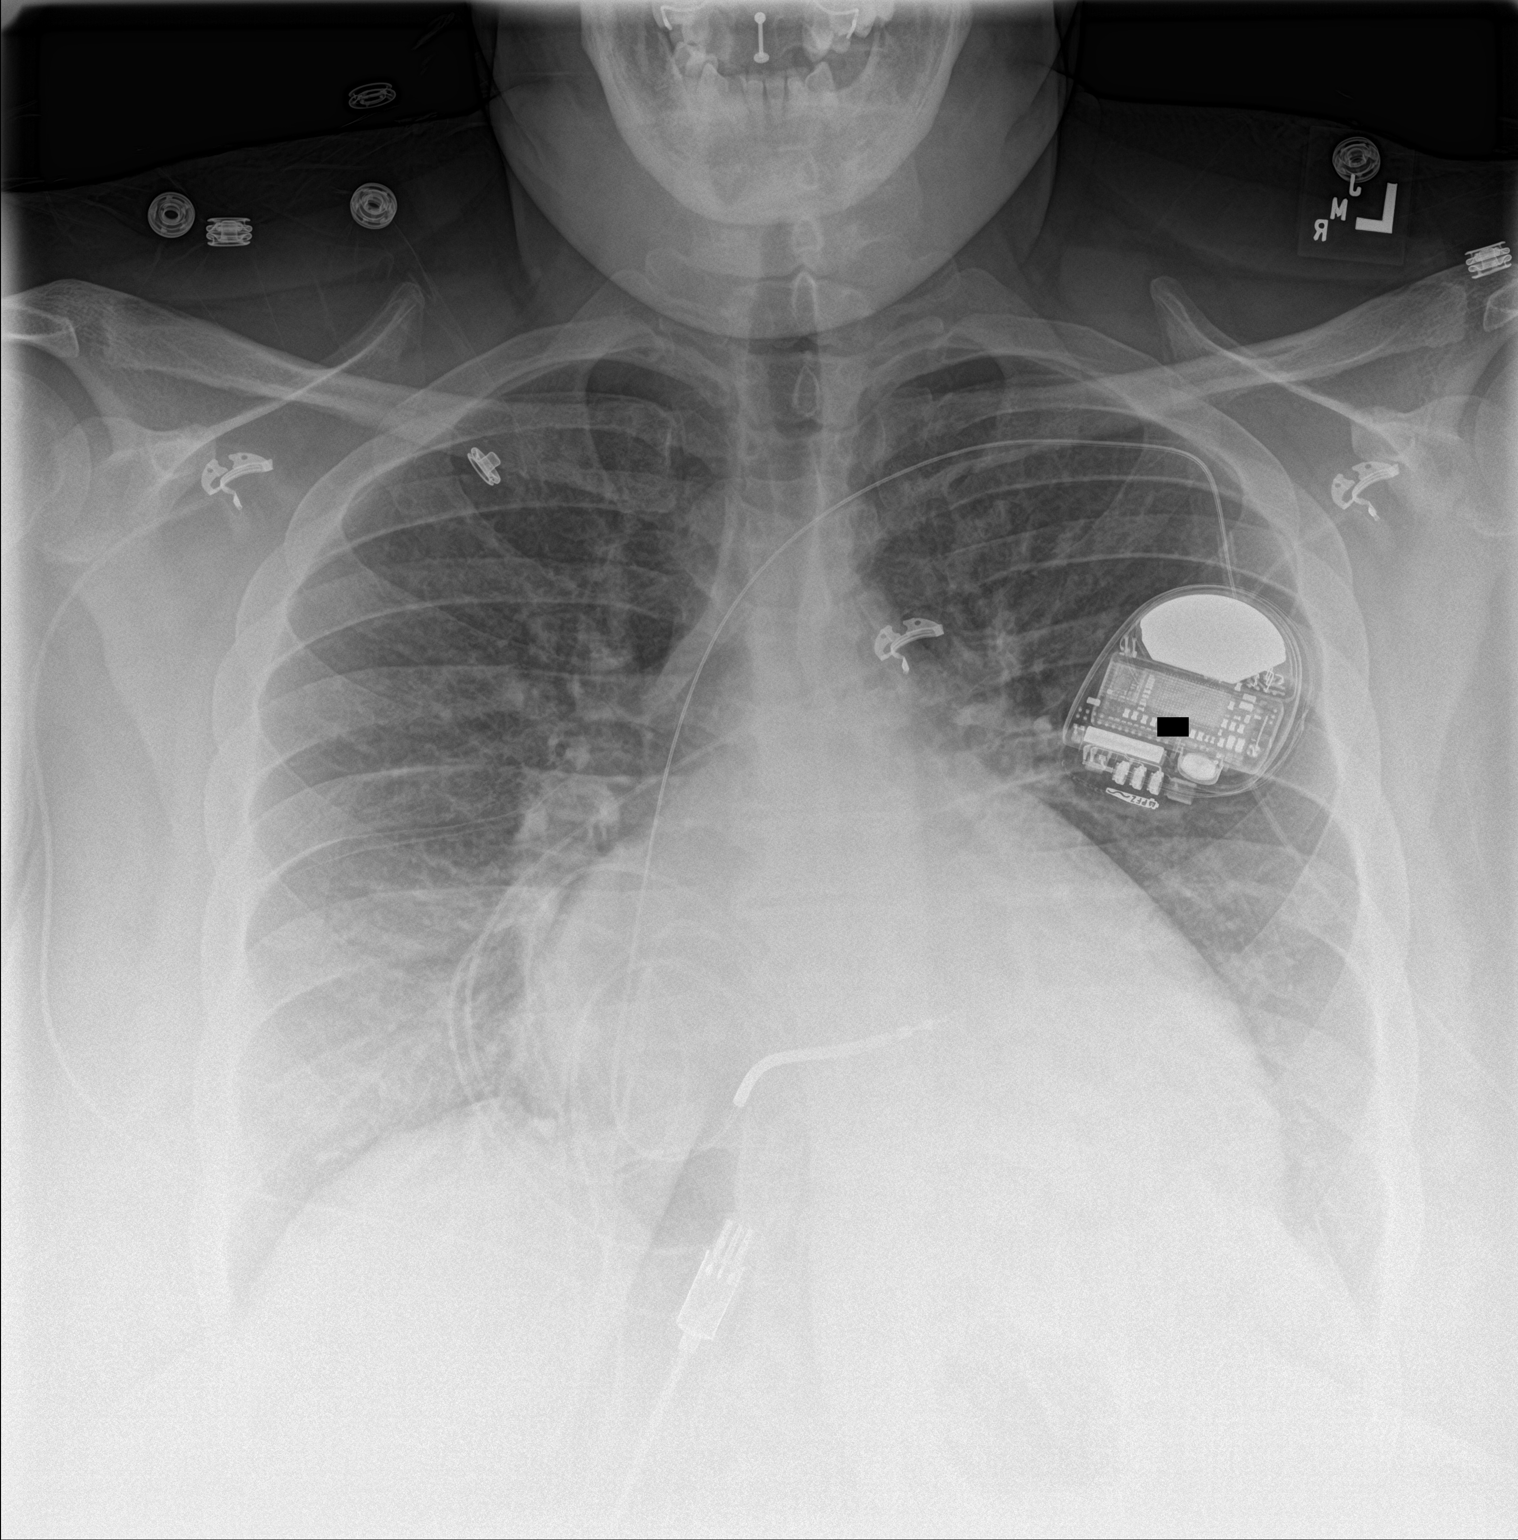

[chest lat]
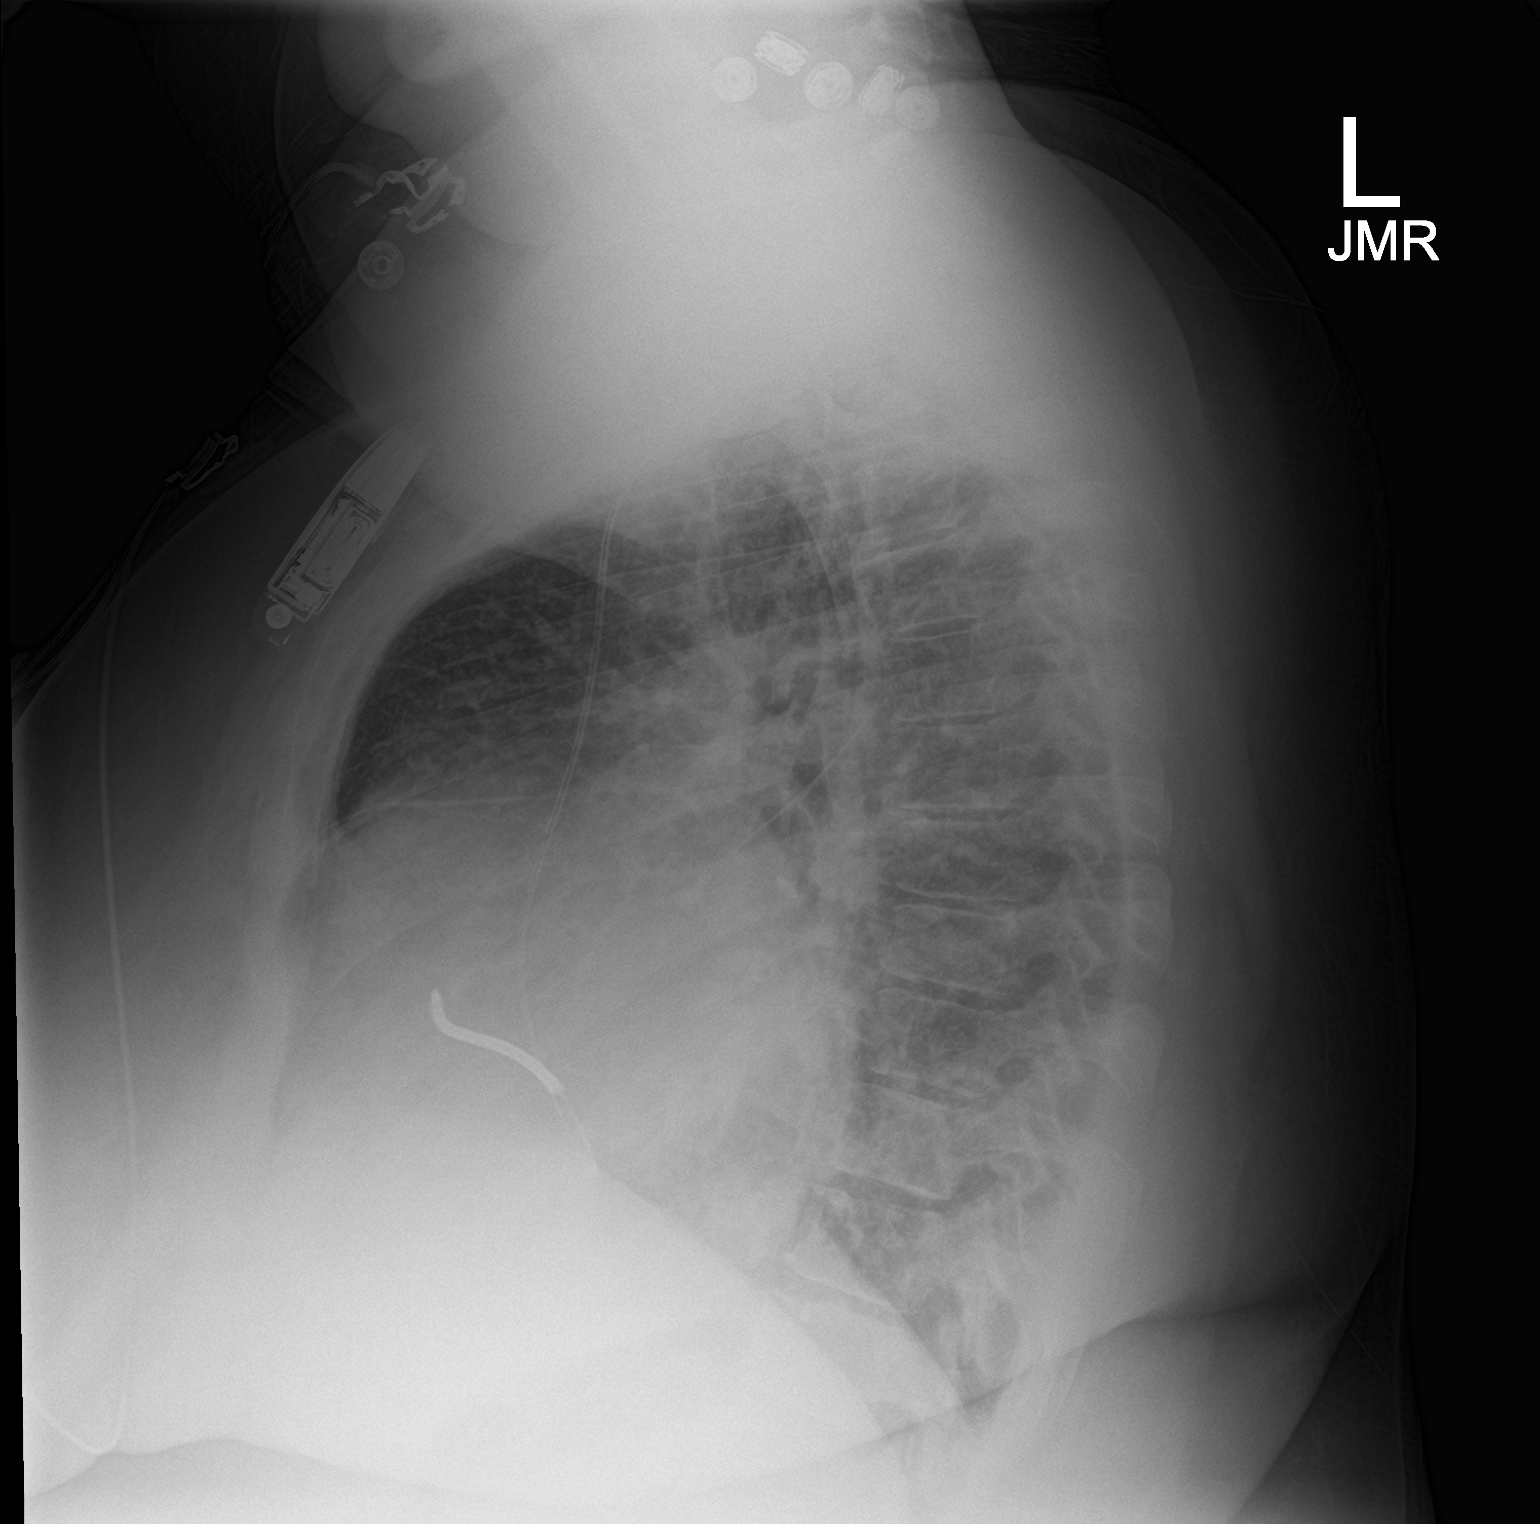

[2 of 2 positions shown; findings below may reference images not displayed]

FINDINGS: Left-sided pacemaker unchanged. Lungs are adequately inflated and
demonstrate persistent mild interstitial edema with mild
cardiomegaly. No effusion. Remainder the exam is unchanged.
IMPRESSION: Mild stable cardiomegaly with stable mild vascular congestion.

## 2019-12-28 DIAGNOSIS — I5023 Acute on chronic systolic (congestive) heart failure: Secondary | ICD-10-CM | POA: Diagnosis not present

## 2019-12-28 DIAGNOSIS — Z452 Encounter for adjustment and management of vascular access device: Secondary | ICD-10-CM | POA: Diagnosis not present

## 2019-12-28 DIAGNOSIS — J45909 Unspecified asthma, uncomplicated: Secondary | ICD-10-CM | POA: Diagnosis not present

## 2019-12-28 DIAGNOSIS — Z5181 Encounter for therapeutic drug level monitoring: Secondary | ICD-10-CM | POA: Diagnosis not present

## 2019-12-28 DIAGNOSIS — E119 Type 2 diabetes mellitus without complications: Secondary | ICD-10-CM | POA: Diagnosis not present

## 2019-12-28 DIAGNOSIS — I340 Nonrheumatic mitral (valve) insufficiency: Secondary | ICD-10-CM | POA: Diagnosis not present

## 2019-12-28 DIAGNOSIS — F17210 Nicotine dependence, cigarettes, uncomplicated: Secondary | ICD-10-CM | POA: Diagnosis not present

## 2019-12-28 DIAGNOSIS — Z6841 Body Mass Index (BMI) 40.0 and over, adult: Secondary | ICD-10-CM | POA: Diagnosis not present

## 2019-12-28 DIAGNOSIS — O903 Peripartum cardiomyopathy: Secondary | ICD-10-CM | POA: Diagnosis not present

## 2019-12-28 DIAGNOSIS — Z79899 Other long term (current) drug therapy: Secondary | ICD-10-CM | POA: Diagnosis not present

## 2019-12-28 DIAGNOSIS — Z7984 Long term (current) use of oral hypoglycemic drugs: Secondary | ICD-10-CM | POA: Diagnosis not present

## 2019-12-29 ENCOUNTER — Telehealth (HOSPITAL_COMMUNITY): Payer: Self-pay | Admitting: *Deleted

## 2019-12-29 NOTE — Telephone Encounter (Signed)
Accomplishments/positives: - Her husband (separated) came to town and helped with the girls and complimented her parenting and gratitude for caring for them. This made her feel accomplished - She has had some challenging and unforeseen circumstances in the last week that she is working through and she is feeling like there is moving in the right direction  - She has shifted her priority back to herself as she is navigating this hurdle  - Today, she sounded uplifted and with some calmness in her voice - She wants to resume her exercise and eating plans to get back on track - Although she has not lost any more weight, she I happy that she has not gained any more weight. Last reported weight was Sunday 4/11 at 347lbs  Challenges - Pleas Koch and trying to maintain her healthy habits - Family and friend conflicts- working though this  - Exercise regimen- she is working to reset a new plan, we discussed what options have worked for her in the past  - Says she just hasn't been thinking about eating and when she does she doesn't worry about what she eats necessarily so she was surprised she has not gain anymore weight. She acknowledged this could be due to lifestyle habit change that occurs without her directly thinking of her choices.   Recommendations/plans - We discussed her mindfulness today to regather a plan for her meals and eating habits - She is going to re-establish her measurable goals that she will work on for the next week. This includes her meal planning and exercise.  - We will meet tomorrow morning (4/15 7:45am) for a [facility] walk pending no conflict with other clinic sessions. She will share and we will document her week's goals at this session.  - She will weight prior to her walking session and report for the first time in several weeks.   Overall, Mckena was more positive today and was more focused on the accomplishments and acknowledges her faults while crediting herself for what has  worked in the past. She has shifted her mind back to making a change for overall goal for improvement in quality of life through weightloss to achieve candidacy for LVAD implantation. We revisited if health coaching is a beneficial service for her and she confirmed she wanted to continue.   Time spent telephonically with patient: 30 minutes     Lesia Hausen, MS, ACSM, [facility] Clinical Exercise Physiologist/ Health and Wellness Coach

## 2019-12-30 ENCOUNTER — Other Ambulatory Visit (HOSPITAL_COMMUNITY): Payer: Self-pay

## 2019-12-30 ENCOUNTER — Other Ambulatory Visit: Payer: Self-pay

## 2019-12-30 ENCOUNTER — Other Ambulatory Visit (HOSPITAL_COMMUNITY): Payer: Self-pay | Admitting: *Deleted

## 2019-12-30 MED ORDER — TORSEMIDE 20 MG PO TABS: ORAL_TABLET | ORAL | 3 refills | Status: DC

## 2019-12-30 MED ORDER — SACUBITRIL-VALSARTAN 24-26 MG PO TABS: 1.0000 | ORAL_TABLET | Freq: Two times a day (BID) | ORAL | 5 refills | Status: DC

## 2019-12-30 MED ORDER — DIGOXIN 125 MCG PO TABS: 0.1250 mg | ORAL_TABLET | Freq: Every day | ORAL | 3 refills | Status: DC

## 2019-12-30 NOTE — Progress Notes (Signed)
Paramedicine Encounter    [name], female    DOB: 08-Jun-1981, 39 y.o.   MRN: [id number]   Patient Care Team: System, Provider Not In as PCP - General Constance Haw, MD as PCP - Electrophysiology (Cardiology) Larey Dresser, MD as PCP - Advanced Heart Failure (Cardiology)  Patient Active Problem List   Diagnosis Date Noted  . Acute on chronic systolic CHF (congestive heart failure) ([place]) 06/17/2019  . Obstructive sleep apnea 06/17/2019  . 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 Diabetes mellitus ([place]) 06/17/2019  . Amenorrhea 08/03/2018  . Congestive heart failure ([place]) 06/29/2018  . Hypotension 06/07/2018  . Rhinovirus infection 06/06/2018  . Asthma 06/05/2018  . Acute respiratory failure with hypoxia ([place]) 06/05/2018  . Hypokalemia 06/05/2018  . Snoring 09/23/2016  . Cough 04/10/2016  . NICM (nonischemic cardiomyopathy) ([place]) 01/25/2016  . Tobacco abuse 01/25/2016  . Acute on chronic combined systolic and diastolic CHF (congestive heart failure) ([place]) 01/25/2016  . Mitral regurgitation   . Abnormal EKG-inf TWI 01/15/2016  . Positive D dimer-CTA neg for PE 01/15/2016  . Morbid obesity- BMI 57 01/14/2016  . Abdominal pain 01/14/2016  . 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 At risk for sleep apnea 01/14/2016  . Cardiomyopathy ([place]) 11/01/2015  . Morbid obesity with BMI of 50.0-59.9, adult ([place]) 08/16/2015    Current Outpatient Medications:  .  albuterol (PROAIR HFA) 108 (90 Base) MCG/ACT inhaler, Inhale 2 puffs into the lungs every 6 (six) hours as needed for wheezing or shortness of breath., Disp: , Rfl:  .  atorvastatin (LIPITOR) 10 MG tablet, Take 1 tablet (10 mg total) by mouth daily. (Patient taking differently: Take 10 mg by mouth daily. ), Disp: 90 tablet, Rfl: 3 .  carvedilol (COREG) 6.25 MG tablet, Take 1 tablet (6.25 mg total) by mouth 2 (two) times daily., Disp: 180 tablet, Rfl: 3 .  digoxin (LANOXIN) 0.125 MG  tablet, Take 1 tablet (0.125 mg total) by mouth daily., Disp: 90 tablet, Rfl: 3 .  ivabradine (CORLANOR) 7.5 MG TABS tablet, Take 1 tablet (7.5 mg total) by mouth 2 (two) times daily with a meal., Disp: 60 tablet, Rfl: 3 .  Magnesium Oxide 400 (240 Mg) MG TABS, Take 2 tablets by mouth every morning and 1 tablet by mouth every evening, Disp: 90 tablet, Rfl: 6 .  metolazone (ZAROXOLYN) 2.5 MG tablet, TAKE 1 TABLET(2.5 MG) BY MOUTH 1 TIME A WEEK (Patient taking differently: TAKE 1 TABLET(2.5 MG) BY MOUTH 1 TIME A WEEK), Disp: 10 tablet, Rfl: 3 .  milrinone (PRIMACOR) 20 MG/100 ML SOLN infusion, Inject 0.0414 mg/min into the vein continuous., Disp:  , Rfl:  .  potassium chloride (KLOR-CON) 10 MEQ tablet, Take 80 meq (8 tabs) in the morning, 60 meq (6 tabs) in the afternoon, and 60 meq (6 tabs) in the evening, Disp: 600 tablet, Rfl: 5 .  sacubitril-valsartan (ENTRESTO) 24-26 MG, Take 1 tablet by mouth 2 (two) times daily., Disp: 60 tablet, Rfl: 5 .  spironolactone (ALDACTONE) 25 MG tablet, Take 1 tablet (25 mg total) by mouth daily. (Patient taking differently: Take 25 mg by mouth daily. ), Disp: 90 tablet, Rfl: 3 .  torsemide (DEMADEX) 20 MG tablet, Take 5 tablets (100 mg total) by mouth in the morning AND 4 tablets (80 mg total) every evening., Disp: 720 tablet, Rfl: 3 .  ipratropium-albuterol (DUONEB) 0.5-2.5 (3) MG/3ML SOLN, Take 3 mLs by nebulization every 4 (four) hours as needed. (Patient not taking: Reported on 12/09/2019),  Disp: 360 mL, Rfl: 0 .  ondansetron (ZOFRAN) 4 MG tablet, Take 1 tablet (4 mg total) by mouth every 8 (eight) hours as needed for nausea or vomiting. (Patient not taking: Reported on 12/09/2019), Disp: 20 tablet, Rfl: 0 .  PRESCRIPTION MEDICATION, CPAP- AT BEDTIME, Disp: , Rfl:  .  topiramate (TOPAMAX) 25 MG tablet, Take by mouth., Disp: , Rfl:  Allergies  Allergen Reactions  . Metformin And Related Diarrhea    And caused severe "dry mouth," also      Social History    Socioeconomic History  . Marital status: Single    Spouse name: Not on file  . Number of children: 2  . Years of education: Not on file  . Highest education level: Not on file  Occupational History  . Not on file  Tobacco Use  . Smoking status: Former Smoker    Quit date: 10/19/2015    Years since quitting: 4.2  . Smokeless tobacco: Never Used  Substance and Sexual Activity  . Alcohol use: Yes    Alcohol/week: 0.0 standard drinks    Comment: occasionally  . Drug use: Yes    Types: Marijuana    Comment: only socially  . Sexual activity: Yes  Other Topics Concern  . Not on file  Social History Narrative  . Not on file   Social Determinants of Health   Financial Resource Strain: High Risk  . Difficulty of Paying Living Expenses: Hard  Food Insecurity: Food Insecurity Present  . Worried About Programme researcher, broadcasting/film/video in the Last Year: Sometimes true  . Ran Out of Food in the Last Year: Sometimes true  Transportation Needs: No Transportation Needs  . Lack of Transportation (Medical): No  . Lack of Transportation (Non-Medical): No  Physical Activity:   . Days of Exercise per Week:   . Minutes of Exercise per Session:   Stress:   . Feeling of Stress :   Social Connections:   . Frequency of Communication with Friends and Family:   . Frequency of Social Gatherings with Friends and Family:   . Attends Religious Services:   . Active Member of Clubs or Organizations:   . Attends Banker Meetings:   Marland Kitchen Marital Status:   Intimate Partner Violence:   . Fear of Current or Ex-Partner:   . Emotionally Abused:   Marland Kitchen Physically Abused:   . Sexually Abused:     Physical Exam      Future Appointments  Date Time Provider Department Center  01/04/2020  2:00 PM [facility] PA/NP [facility] None  01/24/2020  7:45 AM [facility] DEVICE REMOTES [facility]  04/24/2020  7:45 AM [facility] DEVICE REMOTES [facility]  07/24/2020  7:45 AM [facility] DEVICE  REMOTES [facility]    BP (!) 94/0   Pulse 84   Temp 97.6 F (36.4 C)   Resp 18   SpO2 98%  Weight today--350 Weight yesterday-351 Last visit weight-345  Pt reports she is managing ok. Pt reports she has been waking up with h/a past 1-2 wks. Not been using CPAP. H/a goes away in middle of day, late morning time. She does not take anything for it.  Her weight is back up. She did not take her metolazone yesterday due to her being out and about with taking her child to school and talking to school counselor and security there.  I told her to take it today.  --meds verified and pill box refilled. She is missing the torsemide-has enough for  today. States she will fill   --ordered torsemide, entresto and digoxin--had to get clinic to send over rx for it b/c [facility] didn't have those on file. She needs the torsemide today. Pharmacy reports it could be sent out today.  I called pharmacy later in the day but they were already closed. I asked jenna to f/u with pt tomor to see if meds were delivered and if she took her metolazone like she said she would do today.     Kerry Hough, EMT-Paramedic [phone] [facility] Paramedic  12/30/19

## 2019-12-31 ENCOUNTER — Telehealth (HOSPITAL_COMMUNITY): Payer: Self-pay | Admitting: Licensed Clinical Social Worker

## 2019-12-31 NOTE — Telephone Encounter (Signed)
CSW called pt to check in.  Pt reports doing ok- had been up in her weight but is trying to focus back in and has lost about 5lbs recently.  Has a lot going on in per personal life right now but is starting with a new therapist this week and hopeful this will be a good change for her.  Pt was supposed to receive medication delivery yesterday but didn't- CSW check with pharmacy and they will attempt to deliver again today.  No immediate concerns at this time- CSW will continue to follow and assist as needed  Burna Sis, LCSW Clinical Social Worker [facility] Desk#: [phone] Cell#: [phone]

## 2020-01-04 ENCOUNTER — Ambulatory Visit (HOSPITAL_COMMUNITY)
Admission: RE | Admit: 2020-01-04 | Discharge: 2020-01-04 | Disposition: A | Discharge status: Home / Self Care | Payer: Medicaid Other | Source: Ambulatory Visit | Attending: Cardiology | Admitting: Cardiology

## 2020-01-04 ENCOUNTER — Other Ambulatory Visit: Payer: Self-pay

## 2020-01-04 ENCOUNTER — Other Ambulatory Visit (HOSPITAL_COMMUNITY): Payer: Self-pay

## 2020-01-04 VITALS — BP 112/64 | HR 91 | Wt 342.5 lb

## 2020-01-04 DIAGNOSIS — Z79899 Other long term (current) drug therapy: Secondary | ICD-10-CM | POA: Insufficient documentation

## 2020-01-04 DIAGNOSIS — Z5181 Encounter for therapeutic drug level monitoring: Secondary | ICD-10-CM | POA: Diagnosis not present

## 2020-01-04 DIAGNOSIS — E876 Hypokalemia: Secondary | ICD-10-CM | POA: Insufficient documentation

## 2020-01-04 DIAGNOSIS — F17210 Nicotine dependence, cigarettes, uncomplicated: Secondary | ICD-10-CM | POA: Diagnosis not present

## 2020-01-04 DIAGNOSIS — G4730 Sleep apnea, unspecified: Secondary | ICD-10-CM

## 2020-01-04 DIAGNOSIS — I5022 Chronic systolic (congestive) heart failure: Secondary | ICD-10-CM | POA: Insufficient documentation

## 2020-01-04 DIAGNOSIS — G4733 Obstructive sleep apnea (adult) (pediatric): Secondary | ICD-10-CM | POA: Insufficient documentation

## 2020-01-04 DIAGNOSIS — I471 Supraventricular tachycardia: Secondary | ICD-10-CM | POA: Insufficient documentation

## 2020-01-04 DIAGNOSIS — Z87891 Personal history of nicotine dependence: Secondary | ICD-10-CM | POA: Diagnosis not present

## 2020-01-04 DIAGNOSIS — I428 Other cardiomyopathies: Secondary | ICD-10-CM | POA: Insufficient documentation

## 2020-01-04 DIAGNOSIS — Z6841 Body Mass Index (BMI) 40.0 and over, adult: Secondary | ICD-10-CM | POA: Diagnosis not present

## 2020-01-04 DIAGNOSIS — Z8249 Family history of ischemic heart disease and other diseases of the circulatory system: Secondary | ICD-10-CM | POA: Insufficient documentation

## 2020-01-04 DIAGNOSIS — Z7984 Long term (current) use of oral hypoglycemic drugs: Secondary | ICD-10-CM | POA: Diagnosis not present

## 2020-01-04 DIAGNOSIS — I340 Nonrheumatic mitral (valve) insufficiency: Secondary | ICD-10-CM | POA: Diagnosis not present

## 2020-01-04 DIAGNOSIS — Z95810 Presence of automatic (implantable) cardiac defibrillator: Secondary | ICD-10-CM

## 2020-01-04 DIAGNOSIS — O903 Peripartum cardiomyopathy: Secondary | ICD-10-CM | POA: Diagnosis not present

## 2020-01-04 DIAGNOSIS — I5023 Acute on chronic systolic (congestive) heart failure: Secondary | ICD-10-CM | POA: Diagnosis not present

## 2020-01-04 DIAGNOSIS — E119 Type 2 diabetes mellitus without complications: Secondary | ICD-10-CM | POA: Diagnosis not present

## 2020-01-04 DIAGNOSIS — Z452 Encounter for adjustment and management of vascular access device: Secondary | ICD-10-CM | POA: Diagnosis not present

## 2020-01-04 DIAGNOSIS — Z7901 Long term (current) use of anticoagulants: Secondary | ICD-10-CM | POA: Diagnosis not present

## 2020-01-04 DIAGNOSIS — J45909 Unspecified asthma, uncomplicated: Secondary | ICD-10-CM | POA: Diagnosis not present

## 2020-01-04 NOTE — Progress Notes (Addendum)
Advanced Heart Failure Clinic Note   Referring Physician:PCP: System, Provider Not In PCP-Cardiologist: Dr. Shirlee Latch     HPI: 39 y/o female with super morbid obesity, DM2, OSA, chronic systolic HF due to severe NICM (EF ~20%), and medtronic ICD.   She presented to the ER on 06/17/19 with several days of worsening HF with SOB at rest, marked edema and orthopnea/PND, not responding to increased doses of oral diuretics. She admitted to high levels of water and Gatorade intake.   Her admit wt was 380 lb (Dry wt ~360 lb). She was started on IV Lasix but had initial poor urinary response. PO metolazone added as adjunct. Repeat echo was obtained and showed EF <20%, relatively normal RV, moderate MR. RHC 10/5 with low output (CI 1.8) and elevated PCWP but near normal RA pressure. She was started on inotropic therapy w/ milrinone for low output, 0.25 mcg/kg/min. Also treated w/ Entresto, Coreg, spironolactone, digoxin and ivabradine. She was later transitioned off of IV Lasix to po torsemide 80 mg qam/60 mg qpm.   Unfortunately, she was not a transplant candidate due to her morbid obesity. We discussed her at [facility] and Dr. Donata Clay has seen. She will need significant weight loss to get to LVAD. Will use home milrinone to try to facilitate increased activity to work towards weight loss. She understands that milrinone is not a good end-point and that we would like to use it as a bridge to eventual LVAD. RV function seems adequate for LVAD. Pt is motivated to lose weight. Tunnel cath was placed for home milrinone and home health was arranged. She was discharged home on 10/9 on milrinone 0.25 mcg.   06/27/19 she presented to the [facility] w/ CC of palpitations. Was brought in by EMS and was reportedly in SVT with HR in the 180s. She was given adenosine and converted to NSR prior to arrival to the ED. In the ED she was stable and symptoms had resolved. Labs showed normal CBC. BMP showed hypokalemia (3.0),  hyperglycemia (243), mildly elevated BUN/SCr, and low Mag, 1.5. She was given K and Mg supplementation and discharged home from the ED.  PICC exchanged 10/15/19  Today she returns for HF follow up. Remains on milrinone. Overall feeling fine. Says she was off track for few weeks and had not been taking her medications and had been eating high sodium foods. SOB with moderate exertion. Denies PND/Orthopnea. Appetite ok. No fever or chills. Weight at home has been staying about 341 pounds. Taking all medications.  Followed by [facility], [facility]. No issues with PICC.  Followed by HF Paramedicine.  RHC Procedural Findings (10/5): Hemodynamics (mmHg) RA mean 7 RV 49/10 PA 65/25 mean 45 PCWP mean 27 Oxygen saturations: PA 57% AO 100% Cardiac Output (Fick) 4.65  Cardiac Index (Fick) 1.82 PVR 3.87 WU PAPI 5.7  2D Echo 06/18/19  Left ventricular ejection fraction, by visual estimation, is <20%. The left ventricle has severely decreased function. Severely increased left ventricular size. There is no left ventricular hypertrophy. Severe global hypokinesis. 2. Left ventricular diastolic Doppler parameters are indeterminate pattern of LV diastolic filling. 3. Global right ventricle has low normal systolic function.The right ventricular size is normal. No increase in right ventricular wall thickness. 4. Left atrial size was severely dilated. 5. Right atrial size was mildly dilated. 6. The mitral valve is abnormal. Moderate mitral valve regurgitation. 7. The tricuspid valve is grossly normal. Tricuspid valve regurgitation is trivial. 8. The aortic valve is tricuspid Aortic valve regurgitation was not  visualized by color flow Doppler. 9. The pulmonic valve was grossly normal. Pulmonic valve regurgitation is not visualized by color flow Doppler. 10. Mildly elevated pulmonary artery systolic pressure. 11. A pacer wire is visualized. 12. The inferior vena cava is dilated in size with >50% respiratory  variability, suggesting right atrial pressure of 8 mmHg. 13. The interatrial septum was not well visualized.+   Past Medical History:  Diagnosis Date  . Asthma   . Chronic systolic CHF (congestive heart failure) ([place])    a. ECHO 01/15/2016 EF 10-15%. 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  . Diabetes mellitus ([place]) 06/17/2019  . Hearing loss    bilateral  . Mitral regurgitation    a. severe, felt to be functional 2/2 LV dilation   . Morbid obesity ([place])   . Snoring     Current Outpatient Medications  Medication Sig Dispense Refill  . albuterol (PROAIR HFA) 108 (90 Base) MCG/ACT inhaler Inhale 2 puffs into the lungs every 6 (six) hours as needed for wheezing or shortness of breath.    Marland Kitchen atorvastatin (LIPITOR) 10 MG tablet Take 1 tablet (10 mg total) by mouth daily. 90 tablet 3  . carvedilol (COREG) 6.25 MG tablet Take 1 tablet (6.25 mg total) by mouth 2 (two) times daily. 180 tablet 3  . digoxin (LANOXIN) 0.125 MG tablet Take 1 tablet (0.125 mg total) by mouth daily. 90 tablet 3  . ipratropium-albuterol (DUONEB) 0.5-2.5 (3) MG/3ML SOLN Take 3 mLs by nebulization every 4 (four) hours as needed. 360 mL 0  . ivabradine (CORLANOR) 7.5 MG TABS tablet Take 1 tablet (7.5 mg total) by mouth 2 (two) times daily with a meal. 60 tablet 3  . Magnesium Oxide 400 (240 Mg) MG TABS Take 2 tablets by mouth every morning and 1 tablet by mouth every evening 90 tablet 6  . metolazone (ZAROXOLYN) 2.5 MG tablet TAKE 1 TABLET(2.5 MG) BY MOUTH 1 TIME A WEEK 10 tablet 3  . milrinone (PRIMACOR) 20 MG/100 ML SOLN infusion Inject 0.0414 mg/min into the vein continuous.    . ondansetron (ZOFRAN) 4 MG tablet Take 1 tablet (4 mg total) by mouth every 8 (eight) hours as needed for nausea or vomiting. 20 tablet 0  . potassium chloride (KLOR-CON) 10 MEQ tablet Take 80 meq (8 tabs) in the morning, 60 meq (6 tabs) in the afternoon, and 60 meq (6 tabs) in the evening 600 tablet 5  . PRESCRIPTION MEDICATION CPAP- AT BEDTIME     . sacubitril-valsartan (ENTRESTO) 24-26 MG Take 1 tablet by mouth 2 (two) times daily. 60 tablet 5  . spironolactone (ALDACTONE) 25 MG tablet Take 1 tablet (25 mg total) by mouth daily. 90 tablet 3  . topiramate (TOPAMAX) 25 MG tablet Take by mouth.    . torsemide (DEMADEX) 20 MG tablet Take 5 tablets (100 mg total) by mouth in the morning AND 4 tablets (80 mg total) every evening. 720 tablet 3   No current facility-administered medications for this encounter.    Allergies  Allergen Reactions  . Metformin And Related Diarrhea    And caused severe "dry mouth," also      Social History   Socioeconomic History  . Marital status: Single    Spouse name: Not on file  . Number of children: 2  . Years of education: Not on file  . Highest education level: Not on file  Occupational History  . Not on file  Tobacco Use  . Smoking status: Former Smoker  Quit date: 10/19/2015    Years since quitting: 4.2  . Smokeless tobacco: Never Used  Substance and Sexual Activity  . Alcohol use: Yes    Alcohol/week: 0.0 standard drinks    Comment: occasionally  . Drug use: Yes    Types: Marijuana    Comment: only socially  . Sexual activity: Yes  Other Topics Concern  . Not on file  Social History Narrative  . Not on file   Social Determinants of Health   Financial Resource Strain: High Risk  . Difficulty of Paying Living Expenses: Hard  Food Insecurity: Food Insecurity Present  . Worried About Programme researcher, broadcasting/film/video in the Last Year: Sometimes true  . Ran Out of Food in the Last Year: Sometimes true  Transportation Needs: No Transportation Needs  . Lack of Transportation (Medical): No  . Lack of Transportation (Non-Medical): No  Physical Activity:   . Days of Exercise per Week:   . Minutes of Exercise per Session:   Stress:   . Feeling of Stress :   Social Connections:   . Frequency of Communication with Friends and Family:   . Frequency of Social Gatherings with Friends and  Family:   . Attends Religious Services:   . Active Member of Clubs or Organizations:   . Attends Banker Meetings:   Marland Kitchen Marital Status:   Intimate Partner Violence:   . Fear of Current or Ex-Partner:   . Emotionally Abused:   Marland Kitchen Physically Abused:   . Sexually Abused:       Family History  Problem Relation Age of Onset  . Heart attack Mother   . Asthma Mother   . Heart failure Mother   . Hypertension Father   . Diabetes Brother     Vitals:   01/04/20 1429  BP: 112/64  Pulse: 91  SpO2: 98%  Weight: (!) 155.4 kg (342 lb 8 oz)   Wt Readings from Last 3 Encounters:  01/04/20 (!) 155.4 kg (342 lb 8 oz)  12/06/19 (!) 156.1 kg (344 lb 3.2 oz)  11/03/19 (!) 150.6 kg (332 lb)    PHYSICAL EXAM General:  Ambulated in the clinic.No resp difficulty HEENT: normal Neck: supple. no JVD. Carotids 2+ bilat; no bruits. No lymphadenopathy or thryomegaly appreciated. Cor: PMI nondisplaced. Regular rate & rhythm. No rubs, gallops or murmurs. R upper chest tunneled PICC Lungs: clear Abdomen: obese soft, nontender, nondistended. No hepatosplenomegaly. No bruits or masses. Good bowel sounds. Extremities: no cyanosis, clubbing, rash, edema Neuro: alert & orientedx3, cranial nerves grossly intact. moves all 4 extremities w/o difficulty. Affect pleasant  ASSESSMENT & PLAN: 1. Chronic Systolic HF w/ Low Output/ NICM: Echo 06/2019 with EF < 20%, relatively normal RV, moderate MR. NICM with Medtronic ICD. Recent acute exacerbation in the setting of dietary indiscretion w/ sodium. Massive diuresis w/ IV Lasix, ~17 lb. D/c dry wt 357 lb.  RHC 10/5 with low output (CI 1.8) and elevated PCWP but near normal RA pressure, started on milrinone 0.25 w/ plans to continue home milrinone as bridge to possible LVAD if significant weight loss.  - Medtronic -Optivol- Activity ~1 hour per day. No VT/A Fib. Well below fluid index. Discussed optivol.  - NYHA II-III - Volume status stable.  -  Continue  Coreg 3.125 mg bid.  - Continue Entresto 24/26 bid.  - Continue ivabradine to 7.5 mg twice a day.   - Continue spironolactone 25 daily.  - Continue digoxin 0.125 mg - VAD consideration: Her size and  social support will be issues.  She would not be a transplant candidate at this point due to weight. We discussed her at [facility], Dr. Donata Clay has seen.  She will need significant weight loss to get to LVAD.  Will use home milrinone to try to facilitate increased activity to work towards weight loss.  She understands that milrinone is not a good end-point and that we would like to use it as a bridge to eventual LVAD. RV function seems adequate for LVAD.   2. Morbid obesity:  Body mass index is 56.99 kg/m.  [facility] - Discussed portion control.   3. SVT: had ED visit 10/11 for SVT noted by EMS that converted back to NSR after dose of adenosine. This was in the setting of hypokalemia and hypomagnesemia, at 3.0 and 1.5 respectively. Also in the setting of inotropic therapy w/ milrinone.   -No further episodes.   4. OSA Stressed importance of nightly use.  - Needs to use CPAP.   5. DMII -Per PCP. 06/17/19 Hgb A1C 9.4  - Consider SGT2I with weight loss.   Greater than 50% of the (total minutes 25) visit spent in counseling/coordination of care regarding the above. Stressed medication compliance.   Follow up in 4 weeks. Continue HF Paramedicine.  Tonye Becket, NP 01/04/20

## 2020-01-04 NOTE — Progress Notes (Signed)
Paramedicine Encounter   [name] , female,   DOB: [birth year],38 y.o.,  MRN: [id number]   Met patient in clinic today with provider.  Weight @ clinic-342 B/p-112/64 p-91 sp02-98  Pt reports doing ok. Feels like she is getting back on track now. She is taking her meds better, the potassium she said was a life saver and now she doesn't dread taking meds. It doesn't cause her reflux like the other tablets did.  No med changes today.  She brought her meds with her so her pill box was refilled.  She also brought her byrudeon for assistance on how to use it as this one is different from her last needle. This is an auto-injector. She accidentally wasted one as she was trying to learn how to use it and when I was trying to help her today I accidentally pressed down on it causing it to inject the medicine but it wasn't in her, so it was wasted as well. I called pharmacy and he will contact medicaid if it will be too soon to fill and get an emergent fill due to error in learning how to use the new needle.  [name] said her fluid level looked good today.  [facility]N got labs today in the home.  Her and [name] walked today after clinic visit.  Will f/u next week.    [name], [place] 01/04/2020

## 2020-01-04 NOTE — Patient Instructions (Signed)
Your physician recommends that you schedule a follow-up appointment in: 4 weeks  in the Advanced Practitioners (PA/NP) Clinic    Do the following things EVERYDAY: 1) Weigh yourself in the morning before breakfast. Write it down and keep it in a log. 2) Take your medicines as prescribed 3) Eat low salt foods--Limit salt (sodium) to 2000 mg per day.  4) Stay as active as you can everyday 5) Limit all fluids for the day to less than 2 liters   At the Advanced Heart Failure Clinic, you and your health needs are our priority. As part of our continuing mission to provide you with exceptional heart care, we have created designated Provider Care Teams. These Care Teams include your primary Cardiologist (physician) and Advanced Practice Providers (APPs- Physician Assistants and Nurse Practitioners) who all work together to provide you with the care you need, when you need it.   You may see any of the following providers on your designated Care Team at your next follow up: Marland Kitchen Dr Arvilla Meres . Dr Marca Ancona . Tonye Becket, NP . Robbie Lis, PA . Karle Plumber, PharmD   Please be sure to bring in all your medications bottles to every appointment.

## 2020-01-04 NOTE — Addendum Note (Signed)
Encounter addended by: Sherald Hess, NP on: 01/04/2020 3:38 PM  Actions taken: Clinical Note Signed

## 2020-01-05 ENCOUNTER — Telehealth (HOSPITAL_COMMUNITY): Payer: Self-pay | Admitting: Licensed Clinical Social Worker

## 2020-01-05 NOTE — Telephone Encounter (Signed)
CSW had called [facility] dentist office yesterday where pt has been trying to get her teeth repaired to request they email the medical clearance request directly to me as their faxes hav enot arrived successfully.   CSW received call back today and provided them with my email address- they will plan to send out email asap  CSW will continue to follow and assist as needed  Burna Sis, LCSW Clinical Social Worker Advanced Heart Failure Clinic Desk#: [phone] Cell#: [phone]

## 2020-01-06 ENCOUNTER — Telehealth (HOSPITAL_COMMUNITY): Payer: Self-pay | Admitting: Licensed Clinical Social Worker

## 2020-01-06 NOTE — Telephone Encounter (Signed)
CSW called pt to discuss referral to Renaee Munda, Care Guide, for further assistance in Health Coaching.  CSW spoke with pt about referral and hope that Care Guide would be able to further assist [place] in pt reaching her weight and health goals.  Pt agreeable to referral.  Burna Sis, LCSW Clinical Social Worker Advanced Heart Failure Clinic Desk#: [phone] Cell#: [phone]

## 2020-01-07 ENCOUNTER — Encounter (HOSPITAL_COMMUNITY): Payer: Self-pay | Admitting: *Deleted

## 2020-01-07 NOTE — Progress Notes (Signed)
Patient was planned walking appointment with [facility] 4/22 at 11am-12p and she was late to show, reached out to patient and she was unable to attend the meeting due to other conflicts at home. We planned a reschedule for telephonic coaching session today and she was unavailable on both contact numbers. I left a voicemail for her to return call and will follow her again next week.    Lesia Hausen, MS, ACSM, [facility] Clinical Exercise Physiologist/ Health and Wellness Coach

## 2020-01-10 ENCOUNTER — Telehealth (HOSPITAL_COMMUNITY): Payer: Self-pay | Admitting: *Deleted

## 2020-01-10 NOTE — Telephone Encounter (Signed)
Patient returned call on Friday evening. Today I returned the voicemail to make an exercise plan and to schedule our next coaching session. Today she plans to walk with a friend at the [facility] walk and tomorrow we plan to walk at [facility] and have our face-to-face coaching session (01/11/20 9am-10am).    Lesia Hausen, MS, ACSM, [facility] Clinical Exercise Physiologist/ Health and Wellness Coach

## 2020-01-11 ENCOUNTER — Other Ambulatory Visit (HOSPITAL_COMMUNITY): Payer: Self-pay

## 2020-01-11 DIAGNOSIS — Z79899 Other long term (current) drug therapy: Secondary | ICD-10-CM | POA: Diagnosis not present

## 2020-01-11 DIAGNOSIS — Z452 Encounter for adjustment and management of vascular access device: Secondary | ICD-10-CM | POA: Diagnosis not present

## 2020-01-11 DIAGNOSIS — E119 Type 2 diabetes mellitus without complications: Secondary | ICD-10-CM | POA: Diagnosis not present

## 2020-01-11 DIAGNOSIS — I5023 Acute on chronic systolic (congestive) heart failure: Secondary | ICD-10-CM | POA: Diagnosis not present

## 2020-01-11 DIAGNOSIS — F17210 Nicotine dependence, cigarettes, uncomplicated: Secondary | ICD-10-CM | POA: Diagnosis not present

## 2020-01-11 DIAGNOSIS — Z6841 Body Mass Index (BMI) 40.0 and over, adult: Secondary | ICD-10-CM | POA: Diagnosis not present

## 2020-01-11 DIAGNOSIS — I340 Nonrheumatic mitral (valve) insufficiency: Secondary | ICD-10-CM | POA: Diagnosis not present

## 2020-01-11 DIAGNOSIS — O903 Peripartum cardiomyopathy: Secondary | ICD-10-CM | POA: Diagnosis not present

## 2020-01-11 DIAGNOSIS — J45909 Unspecified asthma, uncomplicated: Secondary | ICD-10-CM | POA: Diagnosis not present

## 2020-01-11 DIAGNOSIS — Z7984 Long term (current) use of oral hypoglycemic drugs: Secondary | ICD-10-CM | POA: Diagnosis not present

## 2020-01-11 DIAGNOSIS — Z5181 Encounter for therapeutic drug level monitoring: Secondary | ICD-10-CM | POA: Diagnosis not present

## 2020-01-11 NOTE — Progress Notes (Signed)
Paramedicine Encounter    [name], female    DOB: Dec 07, 1980, 39 y.o.   MRN: [id number]   Patient Care Team: System, Provider Not In as PCP - General Regan Lemming, MD as PCP - Electrophysiology (Cardiology) Laurey Morale, MD as PCP - Advanced Heart Failure (Cardiology)  Patient Active Problem List   Diagnosis Date Noted  . Acute on chronic systolic CHF (congestive heart failure) (HCC) 06/17/2019  . Obstructive sleep apnea 06/17/2019  . 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 Diabetes mellitus (HCC) 06/17/2019  . Amenorrhea 08/03/2018  . Congestive heart failure (HCC) 06/29/2018  . Hypotension 06/07/2018  . Rhinovirus infection 06/06/2018  . Asthma 06/05/2018  . Acute respiratory failure with hypoxia (HCC) 06/05/2018  . Hypokalemia 06/05/2018  . Snoring 09/23/2016  . Cough 04/10/2016  . 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 Tobacco abuse 01/25/2016  . Acute on chronic combined systolic and diastolic CHF (congestive heart failure) (HCC) 01/25/2016  . Mitral regurgitation   . Abnormal EKG-inf TWI 01/15/2016  . Positive D dimer-CTA neg for PE 01/15/2016  . Morbid obesity- BMI 57 01/14/2016  . Abdominal pain 01/14/2016  . 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 At risk for sleep apnea 01/14/2016  . Cardiomyopathy (HCC) 11/01/2015  . Morbid obesity with BMI of 50.0-59.9, adult (HCC) 08/16/2015    Current Outpatient Medications:  .  atorvastatin (LIPITOR) 10 MG tablet, Take 1 tablet (10 mg total) by mouth daily. (Patient taking differently: Take 10 mg by mouth daily. ), Disp: 90 tablet, Rfl: 3 .  carvedilol (COREG) 6.25 MG tablet, Take 1 tablet (6.25 mg total) by mouth 2 (two) times daily., Disp: 180 tablet, Rfl: 3 .  digoxin (LANOXIN) 0.125 MG tablet, Take 1 tablet (0.125 mg total) by mouth daily. (Patient taking differently: Take 0.125 mg by mouth daily. ), Disp: 90 tablet, Rfl: 3 .  ivabradine (CORLANOR) 7.5  MG TABS tablet, Take 1 tablet (7.5 mg total) by mouth 2 (two) times daily with a meal., Disp: 60 tablet, Rfl: 3 .  Magnesium Oxide 400 (240 Mg) MG TABS, Take 2 tablets by mouth every morning and 1 tablet by mouth every evening, Disp: 90 tablet, Rfl: 6 .  metolazone (ZAROXOLYN) 2.5 MG tablet, TAKE 1 TABLET(2.5 MG) BY MOUTH 1 TIME A WEEK, Disp: 10 tablet, Rfl: 3 .  milrinone (PRIMACOR) 20 MG/100 ML SOLN infusion, Inject 0.0414 mg/min into the vein continuous., Disp:  , Rfl:  .  potassium chloride (KLOR-CON) 10 MEQ tablet, Take 80 meq (8 tabs) in the morning, 60 meq (6 tabs) in the afternoon, and 60 meq (6 tabs) in the evening, Disp: 600 tablet, Rfl: 5 .  PRESCRIPTION MEDICATION, CPAP- AT BEDTIME, Disp: , Rfl:  .  sacubitril-valsartan (ENTRESTO) 24-26 MG, Take 1 tablet by mouth 2 (two) times daily., Disp: 60 tablet, Rfl: 5 .  spironolactone (ALDACTONE) 25 MG tablet, Take 1 tablet (25 mg total) by mouth daily. (Patient taking differently: Take 25 mg by mouth daily. ), Disp: 90 tablet, Rfl: 3 .  torsemide (DEMADEX) 20 MG tablet, Take 5 tablets (100 mg total) by mouth in the morning AND 4 tablets (80 mg total) every evening., Disp: 720 tablet, Rfl: 3 .  albuterol (PROAIR HFA) 108 (90 Base) MCG/ACT inhaler, Inhale 2 puffs into the lungs every 6 (six) hours as needed for wheezing or shortness of breath., Disp: , Rfl:  .  ipratropium-albuterol (DUONEB) 0.5-2.5 (3) MG/3ML SOLN, Take 3 mLs by nebulization every 4 (four) hours  as needed. (Patient not taking: Reported on 01/11/2020), Disp: 360 mL, Rfl: 0 .  ondansetron (ZOFRAN) 4 MG tablet, Take 1 tablet (4 mg total) by mouth every 8 (eight) hours as needed for nausea or vomiting. (Patient not taking: Reported on 01/04/2020), Disp: 20 tablet, Rfl: 0 .  topiramate (TOPAMAX) 25 MG tablet, Take by mouth., Disp: , Rfl:  Allergies  Allergen Reactions  . Metformin And Related Diarrhea    And caused severe "dry mouth," also      Social History   Socioeconomic  History  . Marital status: Single    Spouse name: Not on file  . Number of children: 2  . Years of education: Not on file  . Highest education level: Not on file  Occupational History  . Not on file  Tobacco Use  . Smoking status: Former Smoker    Quit date: 10/19/2015    Years since quitting: 4.2  . Smokeless tobacco: Never Used  Substance and Sexual Activity  . Alcohol use: Yes    Alcohol/week: 0.0 standard drinks    Comment: occasionally  . Drug use: Yes    Types: Marijuana    Comment: only socially  . Sexual activity: Yes  Other Topics Concern  . Not on file  Social History Narrative  . Not on file   Social Determinants of Health   Financial Resource Strain: High Risk  . Difficulty of Paying Living Expenses: Hard  Food Insecurity: Food Insecurity Present  . Worried About Charity fundraiser in the Last Year: Sometimes true  . Ran Out of Food in the Last Year: Sometimes true  Transportation Needs: No Transportation Needs  . Lack of Transportation (Medical): No  . Lack of Transportation (Non-Medical): No  Physical Activity:   . Days of Exercise per Week:   . Minutes of Exercise per Session:   Stress:   . Feeling of Stress :   Social Connections:   . Frequency of Communication with Friends and Family:   . Frequency of Social Gatherings with Friends and Family:   . Attends Religious Services:   . Active Member of Clubs or Organizations:   . Attends Archivist Meetings:   Marland Kitchen Marital Status:   Intimate Partner Violence:   . Fear of Current or Ex-Partner:   . Emotionally Abused:   Marland Kitchen Physically Abused:   . Sexually Abused:     Physical Exam      Future Appointments  Date Time Provider [place]  01/24/2020  7:45 AM [facility] DEVICE REMOTES [facility]  02/01/2020  3:00 PM [facility] PA/NP [facility] None  04/24/2020  7:45 AM [facility] DEVICE REMOTES [facility]  07/24/2020  7:45 AM [facility] DEVICE REMOTES [facility]  [facility]    BP (!) 98/0   Pulse 86   Temp (!) 97.5 F (36.4 C)   Resp 18   SpO2 98%    Weight today-342 Weight yesterday-342 Last visit weight-342  Pt reports she is feeling good-physically--she has been doing home videos for workout sessions.  She had last Wednesday dose in her pill box but she said she took it out of the bottle b/c she was out and about.  She states her breathing doing well.  meds verified and pill box refilled.  Nurse came today and changed out her milrinone bag.  Pt denies sob, no dizziness. No edema noted.    [name], [place] [facility] Paramedic  01/11/20

## 2020-01-12 ENCOUNTER — Encounter (HOSPITAL_COMMUNITY): Payer: Self-pay | Admitting: *Deleted

## 2020-01-12 ENCOUNTER — Telehealth: Payer: Self-pay

## 2020-01-12 NOTE — Telephone Encounter (Signed)
Called patient to schedule initial health coaching session. Left patient a message.

## 2020-01-12 NOTE — Progress Notes (Signed)
Patient NS for Planned walking/coaching appointment yesterday at 9am (4/27). Attempted to contact her yesterday and 2x today on both contact numbers listed. Left voicemail with no response. Discussed with CSW, Eileen Stanford, who has been in contact with patient. Will wait for patient to return call to proceed further.    Lesia Hausen, MS, ACSM, [facility] Clinical Exercise Physiologist/ Health and Wellness Coach

## 2020-01-17 ENCOUNTER — Telehealth (HOSPITAL_COMMUNITY): Payer: Self-pay | Admitting: Licensed Clinical Social Worker

## 2020-01-17 ENCOUNTER — Telehealth: Payer: Self-pay

## 2020-01-17 ENCOUNTER — Telehealth (HOSPITAL_COMMUNITY): Payer: Self-pay

## 2020-01-17 NOTE — Telephone Encounter (Signed)
Patient returned phone called regarding interest in health coaching. Explained to patient what health coaching entails and how I can work with her on small goals. Patient is interested and initial visit has been scheduled for May 18th at 10:30am.

## 2020-01-17 NOTE — Telephone Encounter (Signed)
Clearance faxed to [facility] dentistry. Cleared for procedures.

## 2020-01-17 NOTE — Telephone Encounter (Addendum)
CSW called [facility] to ask again about them sending medical clearance request for pt dental procedure- pt has provided them with fax number and CSW has called and supplied them with fax number and with CSW email address to send letter to with no response.  CSW spoke with representative and supplied with email address again.  Received email with medical clearance request attached and had it placed in MD folder to be completed and sent back.  Burna Sis, LCSW Clinical Social Worker Advanced Heart Failure Clinic Desk#: [phone] Cell#: [phone]

## 2020-01-18 ENCOUNTER — Telehealth (HOSPITAL_COMMUNITY): Payer: Self-pay

## 2020-01-19 ENCOUNTER — Other Ambulatory Visit (HOSPITAL_COMMUNITY): Payer: Self-pay | Admitting: Cardiology

## 2020-01-19 DIAGNOSIS — F17210 Nicotine dependence, cigarettes, uncomplicated: Secondary | ICD-10-CM | POA: Diagnosis not present

## 2020-01-19 DIAGNOSIS — Z6841 Body Mass Index (BMI) 40.0 and over, adult: Secondary | ICD-10-CM | POA: Diagnosis not present

## 2020-01-19 DIAGNOSIS — E119 Type 2 diabetes mellitus without complications: Secondary | ICD-10-CM | POA: Diagnosis not present

## 2020-01-19 DIAGNOSIS — O903 Peripartum cardiomyopathy: Secondary | ICD-10-CM | POA: Diagnosis not present

## 2020-01-19 DIAGNOSIS — Z5181 Encounter for therapeutic drug level monitoring: Secondary | ICD-10-CM | POA: Diagnosis not present

## 2020-01-19 DIAGNOSIS — Z79899 Other long term (current) drug therapy: Secondary | ICD-10-CM | POA: Diagnosis not present

## 2020-01-19 DIAGNOSIS — Z452 Encounter for adjustment and management of vascular access device: Secondary | ICD-10-CM | POA: Diagnosis not present

## 2020-01-19 DIAGNOSIS — I5023 Acute on chronic systolic (congestive) heart failure: Secondary | ICD-10-CM | POA: Diagnosis not present

## 2020-01-19 DIAGNOSIS — I340 Nonrheumatic mitral (valve) insufficiency: Secondary | ICD-10-CM | POA: Diagnosis not present

## 2020-01-19 DIAGNOSIS — Z7984 Long term (current) use of oral hypoglycemic drugs: Secondary | ICD-10-CM | POA: Diagnosis not present

## 2020-01-19 DIAGNOSIS — J45909 Unspecified asthma, uncomplicated: Secondary | ICD-10-CM | POA: Diagnosis not present

## 2020-01-20 ENCOUNTER — Telehealth (HOSPITAL_COMMUNITY): Payer: Self-pay

## 2020-01-20 ENCOUNTER — Other Ambulatory Visit (HOSPITAL_COMMUNITY): Payer: Self-pay | Admitting: Cardiology

## 2020-01-20 NOTE — Telephone Encounter (Signed)
Tried to reach out again today but no response. I spoke to [place] to see if Se[place]ina has been responsive to her and she had been texting/responding back to [place].   Kerry Hough, EMT-Paramedic  01/20/20

## 2020-01-20 NOTE — Telephone Encounter (Signed)
Contacted pt to schedule home visit this week. No reply.   Kerry Hough, EMT-Paramedic  01/20/20

## 2020-01-24 ENCOUNTER — Ambulatory Visit (INDEPENDENT_AMBULATORY_CARE_PROVIDER_SITE_OTHER): Payer: Medicaid Other | Admitting: *Deleted

## 2020-01-24 DIAGNOSIS — I428 Other cardiomyopathies: Secondary | ICD-10-CM

## 2020-01-24 LAB — CUP PACEART REMOTE DEVICE CHECK
Battery Remaining Longevity: 115 mo
Battery Voltage: 3 V
Brady Statistic RV Percent Paced: 0 %
Date Time Interrogation Session: 20210510001704
HighPow Impedance: 70 Ohm
Implantable Lead Implant Date: 20180802
Implantable Lead Location: 753860
Implantable Pulse Generator Implant Date: 20180802
Lead Channel Impedance Value: 342 Ohm
Lead Channel Impedance Value: 456 Ohm
Lead Channel Pacing Threshold Amplitude: 0.75 V
Lead Channel Pacing Threshold Pulse Width: 0.4 ms
Lead Channel Sensing Intrinsic Amplitude: 9.25 mV
Lead Channel Sensing Intrinsic Amplitude: 9.25 mV
Lead Channel Setting Pacing Amplitude: 2.5 V
Lead Channel Setting Pacing Pulse Width: 0.4 ms
Lead Channel Setting Sensing Sensitivity: 0.3 mV

## 2020-01-24 NOTE — Progress Notes (Signed)
Remote ICD transmission.   

## 2020-01-25 ENCOUNTER — Other Ambulatory Visit (HOSPITAL_COMMUNITY): Payer: Self-pay

## 2020-01-25 ENCOUNTER — Other Ambulatory Visit (HOSPITAL_COMMUNITY): Payer: Self-pay | Admitting: *Deleted

## 2020-01-25 ENCOUNTER — Other Ambulatory Visit: Payer: Self-pay | Admitting: Cardiology

## 2020-01-25 DIAGNOSIS — J45909 Unspecified asthma, uncomplicated: Secondary | ICD-10-CM | POA: Diagnosis not present

## 2020-01-25 DIAGNOSIS — Z7984 Long term (current) use of oral hypoglycemic drugs: Secondary | ICD-10-CM | POA: Diagnosis not present

## 2020-01-25 DIAGNOSIS — E119 Type 2 diabetes mellitus without complications: Secondary | ICD-10-CM | POA: Diagnosis not present

## 2020-01-25 DIAGNOSIS — F17210 Nicotine dependence, cigarettes, uncomplicated: Secondary | ICD-10-CM | POA: Diagnosis not present

## 2020-01-25 DIAGNOSIS — Z5181 Encounter for therapeutic drug level monitoring: Secondary | ICD-10-CM | POA: Diagnosis not present

## 2020-01-25 DIAGNOSIS — Z79899 Other long term (current) drug therapy: Secondary | ICD-10-CM | POA: Diagnosis not present

## 2020-01-25 DIAGNOSIS — I340 Nonrheumatic mitral (valve) insufficiency: Secondary | ICD-10-CM | POA: Diagnosis not present

## 2020-01-25 DIAGNOSIS — Z6841 Body Mass Index (BMI) 40.0 and over, adult: Secondary | ICD-10-CM | POA: Diagnosis not present

## 2020-01-25 DIAGNOSIS — Z452 Encounter for adjustment and management of vascular access device: Secondary | ICD-10-CM | POA: Diagnosis not present

## 2020-01-25 DIAGNOSIS — I5023 Acute on chronic systolic (congestive) heart failure: Secondary | ICD-10-CM | POA: Diagnosis not present

## 2020-01-25 DIAGNOSIS — O903 Peripartum cardiomyopathy: Secondary | ICD-10-CM | POA: Diagnosis not present

## 2020-01-25 MED ORDER — IVABRADINE HCL 7.5 MG PO TABS: 7.5000 mg | ORAL_TABLET | Freq: Two times a day (BID) | ORAL | 3 refills | Status: DC

## 2020-01-25 MED ORDER — CARVEDILOL 6.25 MG PO TABS: 6.2500 mg | ORAL_TABLET | Freq: Two times a day (BID) | ORAL | 3 refills | Status: DC

## 2020-01-25 MED ORDER — ATORVASTATIN CALCIUM 10 MG PO TABS: 10.0000 mg | ORAL_TABLET | Freq: Every day | ORAL | 3 refills | Status: DC

## 2020-01-25 MED ORDER — DIGOXIN 125 MCG PO TABS: 0.1250 mg | ORAL_TABLET | Freq: Every day | ORAL | 3 refills | Status: DC

## 2020-01-25 MED ORDER — METOLAZONE 2.5 MG PO TABS: ORAL_TABLET | ORAL | 3 refills | Status: DC

## 2020-01-25 NOTE — Progress Notes (Signed)
Paramedicine Encounter    [name], female    DOB: [birth year], 39 y.o.   MRN: [id number]   Patient Care Team: System, Provider Not In as PCP - General Regan Lemming, MD as PCP - Electrophysiology (Cardiology) Laurey Morale, MD as PCP - Advanced Heart Failure (Cardiology)  Patient Active Problem List   Diagnosis Date Noted  . Acute on chronic systolic CHF (congestive heart failure) (HCC) 06/17/2019  . Obstructive sleep apnea 06/17/2019  . 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 Diabetes mellitus (HCC) 06/17/2019  . Amenorrhea 08/03/2018  . Congestive heart failure (HCC) 06/29/2018  . Hypotension 06/07/2018  . Rhinovirus infection 06/06/2018  . Asthma 06/05/2018  . Acute respiratory failure with hypoxia (HCC) 06/05/2018  . Hypokalemia 06/05/2018  . Snoring 09/23/2016  . Cough 04/10/2016  . 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 Tobacco abuse 01/25/2016  . Acute on chronic combined systolic and diastolic CHF (congestive heart failure) (HCC) 01/25/2016  . Mitral regurgitation   . Abnormal EKG-inf TWI 01/15/2016  . Positive D dimer-CTA neg for PE 01/15/2016  . Morbid obesity- BMI 57 01/14/2016  . Abdominal pain 01/14/2016  . 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 At risk for sleep apnea 01/14/2016  . Cardiomyopathy (HCC) 11/01/2015  . Morbid obesity with BMI of 50.0-59.9, adult (HCC) 08/16/2015    Current Outpatient Medications:  .  atorvastatin (LIPITOR) 10 MG tablet, Take 1 tablet (10 mg total) by mouth daily. (Patient taking differently: Take 10 mg by mouth daily. ), Disp: 90 tablet, Rfl: 3 .  carvedilol (COREG) 6.25 MG tablet, Take 1 tablet (6.25 mg total) by mouth 2 (two) times daily., Disp: 180 tablet, Rfl: 3 .  digoxin (LANOXIN) 0.125 MG tablet, Take 1 tablet (0.125 mg total) by mouth daily. (Patient taking differently: Take 0.125 mg by mouth daily. ), Disp: 90 tablet, Rfl: 3 .  ivabradine (CORLANOR) 7.5  MG TABS tablet, Take 1 tablet (7.5 mg total) by mouth 2 (two) times daily with a meal., Disp: 60 tablet, Rfl: 3 .  Magnesium Oxide 400 (240 Mg) MG TABS, Take 2 tablets by mouth every morning and 1 tablet by mouth every evening, Disp: 90 tablet, Rfl: 6 .  metolazone (ZAROXOLYN) 2.5 MG tablet, TAKE 1 TABLET(2.5 MG) BY MOUTH 1 TIME A WEEK, Disp: 10 tablet, Rfl: 3 .  milrinone (PRIMACOR) 20 MG/100 ML SOLN infusion, Inject 0.0414 mg/min into the vein continuous., Disp:  , Rfl:  .  potassium chloride (KLOR-CON) 10 MEQ tablet, Take 80 meq (8 tabs) in the morning, 60 meq (6 tabs) in the afternoon, and 60 meq (6 tabs) in the evening, Disp: 600 tablet, Rfl: 5 .  sacubitril-valsartan (ENTRESTO) 24-26 MG, Take 1 tablet by mouth 2 (two) times daily., Disp: 60 tablet, Rfl: 5 .  spironolactone (ALDACTONE) 25 MG tablet, Take 1 tablet (25 mg total) by mouth daily. (Patient taking differently: Take 25 mg by mouth daily. ), Disp: 90 tablet, Rfl: 3 .  torsemide (DEMADEX) 20 MG tablet, Take 5 tablets (100 mg total) by mouth in the morning AND 4 tablets (80 mg total) every evening., Disp: 720 tablet, Rfl: 3 .  albuterol (PROAIR HFA) 108 (90 Base) MCG/ACT inhaler, Inhale 2 puffs into the lungs every 6 (six) hours as needed for wheezing or shortness of breath., Disp: , Rfl:  .  ipratropium-albuterol (DUONEB) 0.5-2.5 (3) MG/3ML SOLN, Take 3 mLs by nebulization every 4 (four) hours as needed. (Patient not taking: Reported on 01/11/2020), Disp: 360 mL,  Rfl: 0 .  ondansetron (ZOFRAN) 4 MG tablet, Take 1 tablet (4 mg total) by mouth every 8 (eight) hours as needed for nausea or vomiting. (Patient not taking: Reported on 01/04/2020), Disp: 20 tablet, Rfl: 0 .  PRESCRIPTION MEDICATION, CPAP- AT BEDTIME, Disp: , Rfl:  .  topiramate (TOPAMAX) 25 MG tablet, Take by mouth., Disp: , Rfl:  Allergies  Allergen Reactions  . Metformin And Related Diarrhea    And caused severe "dry mouth," also      Social History   Socioeconomic  History  . Marital status: Single    Spouse name: Not on file  . Number of children: 2  . Years of education: Not on file  . Highest education level: Not on file  Occupational History  . Not on file  Tobacco Use  . Smoking status: Former Smoker    Quit date: 10/19/2015    Years since quitting: 4.2  . Smokeless tobacco: Never Used  Substance and Sexual Activity  . Alcohol use: Yes    Alcohol/week: 0.0 standard drinks    Comment: occasionally  . Drug use: Yes    Types: Marijuana    Comment: only socially  . Sexual activity: Yes  Other Topics Concern  . Not on file  Social History Narrative  . Not on file   Social Determinants of Health   Financial Resource Strain: High Risk  . Difficulty of Paying Living Expenses: Hard  Food Insecurity: Food Insecurity Present  . Worried About Programme researcher, broadcasting/film/video in the Last Year: Sometimes true  . Ran Out of Food in the Last Year: Sometimes true  Transportation Needs: No Transportation Needs  . Lack of Transportation (Medical): No  . Lack of Transportation (Non-Medical): No  Physical Activity:   . Days of Exercise per Week:   . Minutes of Exercise per Session:   Stress:   . Feeling of Stress :   Social Connections:   . Frequency of Communication with Friends and Family:   . Frequency of Social Gatherings with Friends and Family:   . Attends Religious Services:   . Active Member of Clubs or Organizations:   . Attends Banker Meetings:   Marland Kitchen Marital Status:   Intimate Partner Violence:   . Fear of Current or Ex-Partner:   . Emotionally Abused:   Marland Kitchen Physically Abused:   . Sexually Abused:     Physical Exam      Future Appointments  Date Time Provider Department Center  02/01/2020 10:30 AM [facility], [facility]  02/01/2020  3:00 PM [facility] PA/NP [facility] None  04/24/2020  7:45 AM [facility] DEVICE REMOTES [facility]  07/24/2020  7:45 AM [facility] DEVICE REMOTES [facility]    BP (!) 122/0   Pulse 88   Temp (!) 97.4 F (36.3 C)   Resp 18   SpO2 98%  CBG's in the 100s Weight today-342 Weight yesterday-342 Last visit weight-342  Pt reports she is feeling ok. She denies any increased sob.  She reports weight maintaining. But she states she has not been trying as hard as she has been. Have not been on the premier shakes lately. Either eating carbs or not eating at all.  She reports doing the workouts at home though.  She states she took her meds from the bottles last week.  She missed a couple of nights of meds last week.  Nurse is coming today to swap out IV bag.   meds verified and pill  box refilled.  She is out of the metolazone that was not placed in pill box.  I called [facility] to order-digoxin, corlanor, metolazone, atorvastatin, carvedilol--he only has the potassium and corlanor on file and he will fill those but the others he will call [facility] to get them to send it over.   She found the topamax and may start taking it. I advised her if she starts it then to be sure to take her potassium and other meds.  Lungs clear, she does feel like she has some bloating and fluid to her belly. Advised her to cut back on the fluids and salt intake.    [name], [facility] Paramedic  01/25/20

## 2020-01-26 ENCOUNTER — Telehealth (HOSPITAL_COMMUNITY): Payer: Self-pay | Admitting: *Deleted

## 2020-01-26 ENCOUNTER — Telehealth (HOSPITAL_COMMUNITY): Payer: Self-pay

## 2020-01-26 NOTE — Telephone Encounter (Signed)
Called to check-in on patient since our last planned meeting was canceled (4/23). Our last coaching session/meeting was 4/14. Will try the patient again at another time.     Lesia Hausen, MS, ACSM, [facility] Clinical Exercise Physiologist/ Health and Wellness Coach

## 2020-01-26 NOTE — Telephone Encounter (Signed)
HH orders signed and faxed to [facility]

## 2020-02-01 ENCOUNTER — Telehealth: Payer: Self-pay

## 2020-02-01 ENCOUNTER — Other Ambulatory Visit (HOSPITAL_COMMUNITY): Payer: Self-pay

## 2020-02-01 ENCOUNTER — Ambulatory Visit (HOSPITAL_COMMUNITY)
Admission: RE | Admit: 2020-02-01 | Discharge: 2020-02-01 | Disposition: A | Discharge status: Home / Self Care | Payer: Medicaid Other | Source: Ambulatory Visit | Attending: Internal Medicine | Admitting: Internal Medicine

## 2020-02-01 ENCOUNTER — Other Ambulatory Visit: Payer: Self-pay

## 2020-02-01 ENCOUNTER — Ambulatory Visit: Payer: Medicaid Other

## 2020-02-01 ENCOUNTER — Encounter (HOSPITAL_COMMUNITY): Payer: Self-pay

## 2020-02-01 VITALS — BP 112/72 | HR 83 | Wt 344.4 lb

## 2020-02-01 DIAGNOSIS — J45909 Unspecified asthma, uncomplicated: Secondary | ICD-10-CM | POA: Diagnosis not present

## 2020-02-01 DIAGNOSIS — Z5181 Encounter for therapeutic drug level monitoring: Secondary | ICD-10-CM | POA: Diagnosis not present

## 2020-02-01 DIAGNOSIS — E119 Type 2 diabetes mellitus without complications: Secondary | ICD-10-CM | POA: Diagnosis not present

## 2020-02-01 DIAGNOSIS — I428 Other cardiomyopathies: Secondary | ICD-10-CM | POA: Diagnosis not present

## 2020-02-01 DIAGNOSIS — F17210 Nicotine dependence, cigarettes, uncomplicated: Secondary | ICD-10-CM | POA: Diagnosis not present

## 2020-02-01 DIAGNOSIS — G4730 Sleep apnea, unspecified: Secondary | ICD-10-CM

## 2020-02-01 DIAGNOSIS — O903 Peripartum cardiomyopathy: Secondary | ICD-10-CM | POA: Diagnosis not present

## 2020-02-01 DIAGNOSIS — I5022 Chronic systolic (congestive) heart failure: Secondary | ICD-10-CM | POA: Insufficient documentation

## 2020-02-01 DIAGNOSIS — G4733 Obstructive sleep apnea (adult) (pediatric): Secondary | ICD-10-CM | POA: Insufficient documentation

## 2020-02-01 DIAGNOSIS — I471 Supraventricular tachycardia: Secondary | ICD-10-CM | POA: Diagnosis not present

## 2020-02-01 DIAGNOSIS — Z6841 Body Mass Index (BMI) 40.0 and over, adult: Secondary | ICD-10-CM | POA: Diagnosis not present

## 2020-02-01 DIAGNOSIS — Z79899 Other long term (current) drug therapy: Secondary | ICD-10-CM | POA: Diagnosis not present

## 2020-02-01 DIAGNOSIS — Z7984 Long term (current) use of oral hypoglycemic drugs: Secondary | ICD-10-CM | POA: Diagnosis not present

## 2020-02-01 DIAGNOSIS — E876 Hypokalemia: Secondary | ICD-10-CM | POA: Diagnosis not present

## 2020-02-01 DIAGNOSIS — Z888 Allergy status to other drugs, medicaments and biological substances status: Secondary | ICD-10-CM | POA: Insufficient documentation

## 2020-02-01 DIAGNOSIS — Z8249 Family history of ischemic heart disease and other diseases of the circulatory system: Secondary | ICD-10-CM | POA: Insufficient documentation

## 2020-02-01 DIAGNOSIS — Z87891 Personal history of nicotine dependence: Secondary | ICD-10-CM | POA: Diagnosis not present

## 2020-02-01 DIAGNOSIS — Z95810 Presence of automatic (implantable) cardiac defibrillator: Secondary | ICD-10-CM | POA: Insufficient documentation

## 2020-02-01 DIAGNOSIS — I5023 Acute on chronic systolic (congestive) heart failure: Secondary | ICD-10-CM | POA: Diagnosis not present

## 2020-02-01 DIAGNOSIS — I340 Nonrheumatic mitral (valve) insufficiency: Secondary | ICD-10-CM | POA: Diagnosis not present

## 2020-02-01 DIAGNOSIS — Z452 Encounter for adjustment and management of vascular access device: Secondary | ICD-10-CM | POA: Diagnosis not present

## 2020-02-01 NOTE — Progress Notes (Signed)
Paramedicine Encounter   Patient ID: Judianne Seiple , female,   DOB: 1981/03/17,38 y.o.,  MRN: [id number]   Met patient in clinic today with provider.  Pt reports she had episode this morning when nurse was changing out her IV bag to where she felt like she was going to pass out.  She reports nurse took her b/p and said it was normal.  She felt like she was having a SVT episode.  She had gotten a few errors on her pump and it had alarmed 3-4 times through out the week.  She reports feeling better, still a little dizzy and that she has to take her time.  Her ICD report showed no irregular rhythm this morning. She is picking fluid up per her report.  She states she has left sided back lowe flank pain  When she takes her torsemide.  She states she feels better after she urinates a few times.  Nurse has done labs today.  3 month f/u for [facility].  1 month for amy.  meds verified and pill box refilled.  She will take the metolazone today with the extra potassium when she gets home.  She has one am dose of torsemide missing in next Tuesday dose. She thinks she has more torsemide at home. If not she will order from pharmacy or call me.   Weight @ clinic-344 B/p-112/72 p-83 sp02-95   [name], [place] 02/01/2020

## 2020-02-01 NOTE — Telephone Encounter (Signed)
Called pt to reschedule appointment. Left message for pt to call back.

## 2020-02-01 NOTE — Patient Instructions (Signed)
Please take a dose of Metolazone today  Your physician recommends that you schedule a follow-up appointment in: 4-6 weeks  in the Advanced Practitioners (PA/NP) Clinic   Your physician recommends that you schedule a follow-up appointment in: 3 months with Dr Shirlee Latch  Do the following things EVERYDAY: 1) Weigh yourself in the morning before breakfast. Write it down and keep it in a log. 2) Take your medicines as prescribed 3) Eat low salt foods--Limit salt (sodium) to 2000 mg per day.  4) Stay as active as you can everyday 5) Limit all fluids for the day to less than 2 liters

## 2020-02-01 NOTE — Progress Notes (Addendum)
Advanced Heart Failure Clinic Note   Referring Physician:PCP: System, Provider Not In PCP-Cardiologist: Dr. 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   She presented to the ER on 06/17/19 with several days of worsening HF with SOB at rest, marked edema and orthopnea/PND, not responding to increased doses of oral diuretics. She admitted to high levels of water and Gatorade intake.   Her admit wt was 380 lb (Dry wt ~360 lb). She was started on IV Lasix but had initial poor urinary response. PO metolazone added as adjunct. Repeat echo was obtained and showed EF <20%, relatively normal RV, moderate MR. RHC 10/5 with low output (CI 1.8) and elevated PCWP but near normal RA pressure. She was started on inotropic therapy w/ milrinone for low output, 0.25 mcg/kg/min. Also treated w/ Entresto, Coreg, spironolactone, digoxin and ivabradine. She was later transitioned off of IV Lasix to po torsemide 80 mg qam/60 mg qpm.   Unfortunately, she was not a transplant candidate due to her morbid obesity. We discussed her at [facility] and Dr. Donata Clay has seen. She will need significant weight loss to get to LVAD. Will use home milrinone to try to facilitate increased activity to work towards weight loss. She understands that milrinone is not a good end-point and that we would like to use it as a bridge to eventual LVAD. RV function seems adequate for LVAD. Pt is motivated to lose weight. Tunnel cath was placed for home milrinone and home health was arranged. She was discharged home on 10/9 on milrinone 0.25 mcg.   06/27/19 she presented to the [facility] w/ CC of palpitations. Was brought in by EMS and was reportedly in SVT with HR in the 180s. She was given adenosine and converted to NSR prior to arrival to the ED. In the ED she was stable and symptoms had resolved. Labs showed normal CBC. BMP showed hypokalemia (3.0),  hyperglycemia (243), mildly elevated BUN/SCr, and low Mag, 1.5. She was given K and Mg supplementation and discharged home from the ED.  PICC exchanged 10/15/19  Today she returns for HF follow up. Today she had a new pump placed for her milrinone and it sounds like the pump was off for about 5 minutes. At that time she felt her heart racing and had some dizziness. Once the pump was back on she felt ok. Overall feeling fine. Mild SOB with steps.  Denies PND/Orthopnea. Having some pain in left flank discomfort after she takes torsemide. She says it goes away after she urinates. Not using CPAP. Appetite ok. No fever or chills. Weight at home 345 pounds. She has not been going to the [facility]. Taking all medications. Followed by [facility] and Paramedicine.    RHC Procedural Findings (10/5): Hemodynamics (mmHg) RA mean 7 RV 49/10 PA 65/25 mean 45 PCWP mean 27 Oxygen saturations: PA 57% AO 100% Cardiac Output (Fick) 4.65  Cardiac Index (Fick) 1.82 PVR 3.87 WU PAPI 5.7  2D Echo 06/18/19  Left ventricular ejection fraction, by visual estimation, is <20%. The left ventricle has severely decreased function. Severely increased left ventricular size. There is no left ventricular hypertrophy. Severe global hypokinesis. 2. Left ventricular diastolic Doppler parameters are indeterminate pattern of LV diastolic filling. 3. Global right ventricle has low normal systolic function.The right ventricular size is normal. No increase in right ventricular wall thickness. 4. Left atrial size was severely dilated. 5. Right  atrial size was mildly dilated. 6. The mitral valve is abnormal. Moderate mitral valve regurgitation. 7. The tricuspid valve is grossly normal. Tricuspid valve regurgitation is trivial. 8. The aortic valve is tricuspid Aortic valve regurgitation was not visualized by color flow Doppler. 9. The pulmonic valve was grossly normal. Pulmonic valve regurgitation is not visualized by color  flow Doppler. 10. Mildly elevated pulmonary artery systolic pressure. 11. A pacer wire is visualized. 12. The inferior vena cava is dilated in size with >50% respiratory variability, suggesting right atrial pressure of 8 mmHg. 13. The interatrial septum was not well visualized.+   Past Medical History:  Diagnosis Date  . Asthma   . Chronic systolic CHF (congestive heart failure) (HCC)    a. ECHO 01/15/2016 EF 10-15%. 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  . Diabetes mellitus (HCC) 06/17/2019  . Hearing loss    bilateral  . Mitral regurgitation    a. severe, felt to be functional 2/2 LV dilation   . Morbid obesity (HCC)   . Snoring     Current Outpatient Medications  Medication Sig Dispense Refill  . atorvastatin (LIPITOR) 10 MG tablet Take 1 tablet (10 mg total) by mouth daily. 90 tablet 3  . carvedilol (COREG) 6.25 MG tablet Take 1 tablet (6.25 mg total) by mouth 2 (two) times daily. 180 tablet 3  . digoxin (LANOXIN) 0.125 MG tablet Take 1 tablet (0.125 mg total) by mouth daily. 90 tablet 3  . ivabradine (CORLANOR) 7.5 MG TABS tablet Take 1 tablet (7.5 mg total) by mouth 2 (two) times daily with a meal. 60 tablet 3  . MAGNESIUM-OXIDE 400 (241.3 Mg) MG tablet Take 1 tablet by mouth 2 (two) times daily.    . metolazone (ZAROXOLYN) 2.5 MG tablet Take only as directed by the CHF clinic 10 tablet 3  . milrinone (PRIMACOR) 20 MG/100 ML SOLN infusion Inject 0.0414 mg/min into the vein continuous.    . milrinone (PRIMACOR) 20 MG/100 ML SOLN infusion Inject into the vein.    . potassium chloride (KLOR-CON) 10 MEQ tablet Take 80 meq (8 tabs) in the morning, 60 meq (6 tabs) in the afternoon, and 60 meq (6 tabs) in the evening 600 tablet 5  . PRESCRIPTION MEDICATION CPAP- AT BEDTIME    . sacubitril-valsartan (ENTRESTO) 24-26 MG Take 1 tablet by mouth 2 (two) times daily. 60 tablet 5  . spironolactone (ALDACTONE) 25 MG tablet Take 1 tablet (25 mg total) by mouth daily. (Patient taking  differently: Take 25 mg by mouth daily. ) 90 tablet 3  . torsemide (DEMADEX) 20 MG tablet Take 5 tablets (100 mg total) by mouth in the morning AND 4 tablets (80 mg total) every evening. 720 tablet 3  . albuterol (PROAIR HFA) 108 (90 Base) MCG/ACT inhaler Inhale 2 puffs into the lungs every 6 (six) hours as needed for wheezing or shortness of breath.    Marland Kitchen BYDUREON 2 MG PEN Inject 2 mg into the skin once a week.    . Exenatide ER (BYDUREON) 2 MG PEN Inject into the skin.    Marland Kitchen ipratropium-albuterol (DUONEB) 0.5-2.5 (3) MG/3ML SOLN Take 3 mLs by nebulization every 4 (four) hours as needed. (Patient not taking: Reported on 01/11/2020) 360 mL 0  . Magnesium Oxide 400 (240 Mg) MG TABS Take 2 tablets by mouth every morning and 1 tablet by mouth every evening 90 tablet 6  . ondansetron (ZOFRAN) 4 MG tablet Take 1 tablet (4 mg total) by mouth every 8 (eight)  hours as needed for nausea or vomiting. (Patient not taking: Reported on 01/04/2020) 20 tablet 0  . potassium chloride (KLOR-CON) 20 MEQ packet Take by mouth.     No current facility-administered medications for this encounter.    Allergies  Allergen Reactions  . Metformin And Related Diarrhea    And caused severe "dry mouth," also      Social History   Socioeconomic History  . Marital status: Single    Spouse name: Not on file  . Number of children: 2  . Years of education: Not on file  . Highest education level: Not on file  Occupational History  . Not on file  Tobacco Use  . Smoking status: Former Smoker    Quit date: 10/19/2015    Years since quitting: 4.2  . Smokeless tobacco: Never Used  Substance and Sexual Activity  . Alcohol use: Yes    Alcohol/week: 0.0 standard drinks    Comment: occasionally  . Drug use: Yes    Types: Marijuana    Comment: only socially  . Sexual activity: Yes  Other Topics Concern  . Not on file  Social History Narrative  . Not on file   Social Determinants of Health   Financial Resource Strain:  High Risk  . Difficulty of Paying Living Expenses: Hard  Food Insecurity: Food Insecurity Present  . Worried About Programme researcher, broadcasting/film/video in the Last Year: Sometimes true  . Ran Out of Food in the Last Year: Sometimes true  Transportation Needs: No Transportation Needs  . Lack of Transportation (Medical): No  . Lack of Transportation (Non-Medical): No  Physical Activity:   . Days of Exercise per Week:   . Minutes of Exercise per Session:   Stress:   . Feeling of Stress :   Social Connections:   . Frequency of Communication with Friends and Family:   . Frequency of Social Gatherings with Friends and Family:   . Attends Religious Services:   . Active Member of Clubs or Organizations:   . Attends Banker Meetings:   Marland Kitchen Marital Status:   Intimate Partner Violence:   . Fear of Current or Ex-Partner:   . Emotionally Abused:   Marland Kitchen Physically Abused:   . Sexually Abused:       Family History  Problem Relation Age of Onset  . Heart attack Mother   . Asthma Mother   . Heart failure Mother   . Hypertension Father   . Diabetes Brother     Vitals:   02/01/20 1510  BP: 112/72  Pulse: 83  SpO2: 95%  Weight: (!) 156.2 kg (344 lb 6.4 oz)   Wt Readings from Last 3 Encounters:  02/01/20 (!) 156.2 kg (344 lb 6.4 oz)  01/04/20 (!) 155.4 kg (342 lb 8 oz)  12/06/19 (!) 156.1 kg (344 lb 3.2 oz)    PHYSICAL EXAM General:  No resp difficulty. Walked in the clinic.  HEENT: normal Neck: supple. no JVD. Carotids 2+ bilat; no bruits. No lymphadenopathy or thryomegaly appreciated. Cor: PMI nondisplaced. Regular rate & rhythm. No rubs, gallops or murmurs. R Upper chest tunneled catheter.  Lungs: clear Abdomen: soft, nontender, nondistended. No hepatosplenomegaly. No bruits or masses. Good bowel sounds. Extremities: no cyanosis, clubbing, rash, edema Neuro: alert & orientedx3, cranial nerves grossly intact. moves all 4 extremities w/o difficulty. Affect pleasant  EKG : SR 77 bpm  personally reviewed.   ASSESSMENT & PLAN: 1. Chronic Systolic HF w/ Low Output/ NICM: Echo 06/2019 with  EF < 20%, relatively normal RV, moderate MR. NICM with Medtronic ICD. Recent acute exacerbation in the setting of dietary indiscretion w/ sodium. Massive diuresis w/ IV Lasix, ~17 lb. D/c dry wt 357 lb.  RHC 10/5 with low output (CI 1.8) and elevated PCWP but near normal RA pressure, started on milrinone 0.25 w/ plans to continue home milrinone as bridge to possible LVAD if significant weight loss.  - Medtronic -Optivol- Activity ~2 hour per day. No VT/A Fib. Impedance trending down.   - NYHA III. Volume status trending up. Continue current dose of torsemide and she will take metolazone today instead of tomorrow.  -  Continue Coreg 3.125 mg bid.  - Continue Entresto 24/26 bid.  - Continue ivabradine to 7.5 mg twice a day.   - Continue spironolactone 25 daily.  - Continue digoxin 0.125 mg - VAD consideration: Her size and social support will be issues.  She would not be a transplant candidate at this point due to weight. We discussed her at [facility], Dr. Prescott Gum has seen.  She will need significant weight loss to get to LVAD.  Will use home milrinone to try to facilitate increased activity to work towards weight loss.  She understands that milrinone is not a good end-point and that we would like to use it as a bridge to eventual LVAD. RV function seems adequate for LVAD.  - Labs pending from home health.   2. Morbid obesity:  Body mass index is 57.31 kg/m.  Needs to re-establish with  [facility] Center. - Discussed portion control.   3. SVT: had ED visit 10/11 for SVT noted by EMS that converted back to NSR after dose of adenosine. This was in the setting of hypokalemia and hypomagnesemia, at 3.0 and 1.5 respectively. Also in the setting of inotropic therapy w/ milrinone.   -No episode on device interrogation.   4. OSA She has not been using CPAP. We discussed today.  - Needs to use CPAP.    5. DMII -Per PCP. 06/17/19 Hgb A1C 9.4  - Consider SGT2I with weight loss.   Follow up in 4 weeks with APP and 12 weeks with Dr Aundra Dubin.   Darrick Grinder, NP 02/01/20

## 2020-02-01 NOTE — Addendum Note (Signed)
Encounter addended by: Sherald Hess, NP on: 02/01/2020 5:06 PM  Actions taken: Clinical Note Signed

## 2020-02-01 NOTE — Progress Notes (Signed)
CSW met with pt during clinic visit today to check in.  Pt reports she is doing ok but did have an "episode" today which she described as feeling like a-fib- was dizzy and felt like she was going to fall over.  Feeling better this afternoon.  Was unable to go to [facility] appt this morning because she wasn't feeling well- will plan to call [facility] to reschedule.  Pt has not gone back to weightloss clinic since she missed an appt several weeks ago- will plan to call them to reschedule though she does not feel they have been helpful for her weightloss.   Pt reports no needs at this time- expresses interested in scheduling walk with CSW next week if available.  Jorge Ny, LCSW Clinical Social Worker Advanced Heart Failure Clinic Desk#: [phone] Cell#: [phone]

## 2020-02-06 DIAGNOSIS — Z452 Encounter for adjustment and management of vascular access device: Secondary | ICD-10-CM | POA: Diagnosis not present

## 2020-02-06 DIAGNOSIS — O903 Peripartum cardiomyopathy: Secondary | ICD-10-CM | POA: Diagnosis not present

## 2020-02-06 DIAGNOSIS — Z79899 Other long term (current) drug therapy: Secondary | ICD-10-CM | POA: Diagnosis not present

## 2020-02-06 DIAGNOSIS — Z5181 Encounter for therapeutic drug level monitoring: Secondary | ICD-10-CM | POA: Diagnosis not present

## 2020-02-06 DIAGNOSIS — I5023 Acute on chronic systolic (congestive) heart failure: Secondary | ICD-10-CM | POA: Diagnosis not present

## 2020-02-06 DIAGNOSIS — Z7984 Long term (current) use of oral hypoglycemic drugs: Secondary | ICD-10-CM | POA: Diagnosis not present

## 2020-02-06 DIAGNOSIS — Z6841 Body Mass Index (BMI) 40.0 and over, adult: Secondary | ICD-10-CM | POA: Diagnosis not present

## 2020-02-06 DIAGNOSIS — F17210 Nicotine dependence, cigarettes, uncomplicated: Secondary | ICD-10-CM | POA: Diagnosis not present

## 2020-02-06 DIAGNOSIS — E119 Type 2 diabetes mellitus without complications: Secondary | ICD-10-CM | POA: Diagnosis not present

## 2020-02-06 DIAGNOSIS — I340 Nonrheumatic mitral (valve) insufficiency: Secondary | ICD-10-CM | POA: Diagnosis not present

## 2020-02-06 DIAGNOSIS — J45909 Unspecified asthma, uncomplicated: Secondary | ICD-10-CM | POA: Diagnosis not present

## 2020-02-08 ENCOUNTER — Telehealth (HOSPITAL_COMMUNITY): Payer: Self-pay

## 2020-02-08 DIAGNOSIS — Z6841 Body Mass Index (BMI) 40.0 and over, adult: Secondary | ICD-10-CM | POA: Diagnosis not present

## 2020-02-08 DIAGNOSIS — F17210 Nicotine dependence, cigarettes, uncomplicated: Secondary | ICD-10-CM | POA: Diagnosis not present

## 2020-02-08 DIAGNOSIS — Z7984 Long term (current) use of oral hypoglycemic drugs: Secondary | ICD-10-CM | POA: Diagnosis not present

## 2020-02-08 DIAGNOSIS — I5023 Acute on chronic systolic (congestive) heart failure: Secondary | ICD-10-CM | POA: Diagnosis not present

## 2020-02-08 DIAGNOSIS — Z5181 Encounter for therapeutic drug level monitoring: Secondary | ICD-10-CM | POA: Diagnosis not present

## 2020-02-08 DIAGNOSIS — Z79899 Other long term (current) drug therapy: Secondary | ICD-10-CM | POA: Diagnosis not present

## 2020-02-08 DIAGNOSIS — O903 Peripartum cardiomyopathy: Secondary | ICD-10-CM | POA: Diagnosis not present

## 2020-02-08 DIAGNOSIS — J45909 Unspecified asthma, uncomplicated: Secondary | ICD-10-CM | POA: Diagnosis not present

## 2020-02-08 DIAGNOSIS — Z452 Encounter for adjustment and management of vascular access device: Secondary | ICD-10-CM | POA: Diagnosis not present

## 2020-02-08 DIAGNOSIS — E119 Type 2 diabetes mellitus without complications: Secondary | ICD-10-CM | POA: Diagnosis not present

## 2020-02-08 DIAGNOSIS — I340 Nonrheumatic mitral (valve) insufficiency: Secondary | ICD-10-CM | POA: Diagnosis not present

## 2020-02-08 NOTE — Telephone Encounter (Signed)
Clearance letter faxed to dental office again.

## 2020-02-09 ENCOUNTER — Telehealth (HOSPITAL_COMMUNITY): Payer: Self-pay | Admitting: *Deleted

## 2020-02-09 ENCOUNTER — Other Ambulatory Visit (HOSPITAL_COMMUNITY): Payer: Self-pay

## 2020-02-09 NOTE — Progress Notes (Signed)
Paramedicine Encounter    [name], female    DOB: 04-Jan-1981, 39 y.o.   MRN: [id number]   Patient Care Team: System, Provider Not In as PCP - General Regan Lemming, MD as PCP - Electrophysiology (Cardiology) Laurey Morale, MD as PCP - Advanced Heart Failure (Cardiology)  Patient Active Problem List   Diagnosis Date Noted  . Acute on chronic systolic CHF (congestive heart failure) (HCC) 06/17/2019  . Obstructive sleep apnea 06/17/2019  . 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 Diabetes mellitus (HCC) 06/17/2019  . Amenorrhea 08/03/2018  . Congestive heart failure (HCC) 06/29/2018  . Hypotension 06/07/2018  . Rhinovirus infection 06/06/2018  . Asthma 06/05/2018  . Acute respiratory failure with hypoxia (HCC) 06/05/2018  . Hypokalemia 06/05/2018  . Snoring 09/23/2016  . Cough 04/10/2016  . 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 Tobacco abuse 01/25/2016  . Acute on chronic combined systolic and diastolic CHF (congestive heart failure) (HCC) 01/25/2016  . Mitral regurgitation   . Abnormal EKG-inf TWI 01/15/2016  . Positive D dimer-CTA neg for PE 01/15/2016  . Morbid obesity- BMI 57 01/14/2016  . Abdominal pain 01/14/2016  . 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 At risk for sleep apnea 01/14/2016  . Cardiomyopathy (HCC) 11/01/2015  . Morbid obesity with BMI of 50.0-59.9, adult (HCC) 08/16/2015    Current Outpatient Medications:  .  atorvastatin (LIPITOR) 10 MG tablet, Take 1 tablet (10 mg total) by mouth daily. (Patient taking differently: Take 10 mg by mouth daily. ), Disp: 90 tablet, Rfl: 3 .  carvedilol (COREG) 6.25 MG tablet, Take 1 tablet (6.25 mg total) by mouth 2 (two) times daily., Disp: 180 tablet, Rfl: 3 .  digoxin (LANOXIN) 0.125 MG tablet, Take 1 tablet (0.125 mg total) by mouth daily. (Patient taking differently: Take 0.125 mg by mouth daily. ), Disp: 90 tablet, Rfl: 3 .  Exenatide ER (BYDUREON) 2  MG PEN, Inject into the skin., Disp: , Rfl:  .  ivabradine (CORLANOR) 7.5 MG TABS tablet, Take 1 tablet (7.5 mg total) by mouth 2 (two) times daily with a meal., Disp: 60 tablet, Rfl: 3 .  Magnesium Oxide 400 (240 Mg) MG TABS, Take 2 tablets by mouth every morning and 1 tablet by mouth every evening, Disp: 90 tablet, Rfl: 6 .  metolazone (ZAROXOLYN) 2.5 MG tablet, Take only as directed by the CHF clinic, Disp: 10 tablet, Rfl: 3 .  milrinone (PRIMACOR) 20 MG/100 ML SOLN infusion, Inject 0.0414 mg/min into the vein continuous., Disp:  , Rfl:  .  potassium chloride (KLOR-CON) 10 MEQ tablet, Take 80 meq (8 tabs) in the morning, 60 meq (6 tabs) in the afternoon, and 60 meq (6 tabs) in the evening (Patient taking differently: Take 80 meq (8 tabs) in the morning, 60 meq (6 tabs) in the afternoon, and 60 meq (6 tabs) in the evening), Disp: 600 tablet, Rfl: 5 .  sacubitril-valsartan (ENTRESTO) 24-26 MG, Take 1 tablet by mouth 2 (two) times daily., Disp: 60 tablet, Rfl: 5 .  spironolactone (ALDACTONE) 25 MG tablet, Take 1 tablet (25 mg total) by mouth daily. (Patient taking differently: Take 25 mg by mouth daily. ), Disp: 90 tablet, Rfl: 3 .  torsemide (DEMADEX) 20 MG tablet, Take 5 tablets (100 mg total) by mouth in the morning AND 4 tablets (80 mg total) every evening., Disp: 720 tablet, Rfl: 3 .  albuterol (PROAIR HFA) 108 (90 Base) MCG/ACT inhaler, Inhale 2 puffs into the lungs every 6 (six) hours  as needed for wheezing or shortness of breath., Disp: , Rfl:  .  BYDUREON 2 MG PEN, Inject 2 mg into the skin once a week., Disp: , Rfl:  .  ipratropium-albuterol (DUONEB) 0.5-2.5 (3) MG/3ML SOLN, Take 3 mLs by nebulization every 4 (four) hours as needed. (Patient not taking: Reported on 01/11/2020), Disp: 360 mL, Rfl: 0 .  MAGNESIUM-OXIDE 400 (241.3 Mg) MG tablet, Take 1 tablet by mouth 2 (two) times daily., Disp: , Rfl:  .  milrinone (PRIMACOR) 20 MG/100 ML SOLN infusion, Inject into the vein., Disp: , Rfl:  .   ondansetron (ZOFRAN) 4 MG tablet, Take 1 tablet (4 mg total) by mouth every 8 (eight) hours as needed for nausea or vomiting. (Patient not taking: Reported on 01/04/2020), Disp: 20 tablet, Rfl: 0 .  potassium chloride (KLOR-CON) 20 MEQ packet, Take by mouth., Disp: , Rfl:  .  PRESCRIPTION MEDICATION, CPAP- AT BEDTIME, Disp: , Rfl:  Allergies  Allergen Reactions  . Metformin And Related Diarrhea    And caused severe "dry mouth," also      Social History   Socioeconomic History  . Marital status: Single    Spouse name: Not on file  . Number of children: 2  . Years of education: Not on file  . Highest education level: Not on file  Occupational History  . Not on file  Tobacco Use  . Smoking status: Former Smoker    Quit date: 10/19/2015    Years since quitting: 4.3  . Smokeless tobacco: Never Used  Substance and Sexual Activity  . Alcohol use: Yes    Alcohol/week: 0.0 standard drinks    Comment: occasionally  . Drug use: Yes    Types: Marijuana    Comment: only socially  . Sexual activity: Yes  Other Topics Concern  . Not on file  Social History Narrative  . Not on file   Social Determinants of Health   Financial Resource Strain: High Risk  . Difficulty of Paying Living Expenses: Hard  Food Insecurity: Food Insecurity Present  . Worried About Programme researcher, broadcasting/film/video in the Last Year: Sometimes true  . Ran Out of Food in the Last Year: Sometimes true  Transportation Needs: No Transportation Needs  . Lack of Transportation (Medical): No  . Lack of Transportation (Non-Medical): No  Physical Activity:   . Days of Exercise per Week:   . Minutes of Exercise per Session:   Stress:   . Feeling of Stress :   Social Connections:   . Frequency of Communication with Friends and Family:   . Frequency of Social Gatherings with Friends and Family:   . Attends Religious Services:   . Active Member of Clubs or Organizations:   . Attends Banker Meetings:   Marland Kitchen Marital  Status:   Intimate Partner Violence:   . Fear of Current or Ex-Partner:   . Emotionally Abused:   Marland Kitchen Physically Abused:   . Sexually Abused:     Physical Exam      Future Appointments  Date Time Provider Department Center  03/14/2020  3:00 PM [facility] PA/NP [facility] None  04/24/2020  7:45 AM [facility] DEVICE REMOTES [facility]  05/17/2020  1:40 PM Laurey Morale, MD [facility] None  07/24/2020  7:45 AM [facility] DEVICE REMOTES [facility]    BP (!) 100/0   Pulse 88   Temp 97.6 F (36.4 C)   Resp 18   Wt (!) 346 lb (156.9 kg)   SpO2 98%  BMI 57.58 kg/m   Weight today-didn't check  Weight yesterday-342 Last visit weight-344  Pt reports she is doing good, no more episodes like she had last week-so it must have been when nuse had changed out pump and she reports leaving her unhooked for about 44min and that time is when she felt that episode.  Her weight is up 4lbs from yesterday-her norm is around 342-344 so she is up a couple lbs. She did eat fried chicken last night for dinner with some mashed potatoes.  Per clinic she is to take 100mg  torsemide this evening instead of the 80mg .  -she needs entresto and torsemide refilled --called pharmacy and they will fill and deliver.  She is lacking one dose of her entresto in the sat pm dose.  She states she does feel more sob and bloated.  She seems down today but she reports she is just tired and sleep much last night.  She is not using her CPAP.  meds verified and pill box refilled.  Will f/u next week.   [name], [facility] Paramedic  02/09/20

## 2020-02-09 NOTE — Telephone Encounter (Signed)
Florentina Addison is out seeing pt and called to report her wt is up 4 lbs from yesterday. Her baseline is 342-344, pt was 342 yesterday and is up to 346 today, pt admits to eating fried chicken for dinner last night. She took her metolazone yesterday, her weekly dose, she is on Tor 100 AM/80 PM. Advised for pt to take 100 mg now instead of 80, will watch salt and diet and will monitor wt and let us know if not coming down. Florentina Addison will advise pt

## 2020-02-10 ENCOUNTER — Telehealth: Payer: Self-pay | Admitting: *Deleted

## 2020-02-10 MED ORDER — CARVEDILOL 12.5 MG PO TABS: 12.5000 mg | ORAL_TABLET | Freq: Two times a day (BID) | ORAL | 2 refills | Status: DC

## 2020-02-10 NOTE — Telephone Encounter (Signed)
-----   Message from Will Jorja Loa, MD sent at 02/05/2020  6:04 PM EDT ----- Abnormal device interrogation reviewed.  Lead parameters and battery status stable.  Short NSVT episodes. Increase coreg to 12.5 mg.

## 2020-02-10 NOTE — Telephone Encounter (Signed)
Pt agreeable to increasing Coreg to 12.5 mg BID. Rx sent to pharmacy. She reports feeling the episodes. She will take two 6.25 mg tablets BID and see how she responds to medication increase before picking up new Rx. Advised to call the office is she has any issues after increase. Patient verbalized understanding and agreeable to plan.

## 2020-02-15 DIAGNOSIS — I340 Nonrheumatic mitral (valve) insufficiency: Secondary | ICD-10-CM | POA: Diagnosis not present

## 2020-02-15 DIAGNOSIS — J45909 Unspecified asthma, uncomplicated: Secondary | ICD-10-CM | POA: Diagnosis not present

## 2020-02-15 DIAGNOSIS — Z6841 Body Mass Index (BMI) 40.0 and over, adult: Secondary | ICD-10-CM | POA: Diagnosis not present

## 2020-02-15 DIAGNOSIS — Z79899 Other long term (current) drug therapy: Secondary | ICD-10-CM | POA: Diagnosis not present

## 2020-02-15 DIAGNOSIS — Z5181 Encounter for therapeutic drug level monitoring: Secondary | ICD-10-CM | POA: Diagnosis not present

## 2020-02-15 DIAGNOSIS — F17210 Nicotine dependence, cigarettes, uncomplicated: Secondary | ICD-10-CM | POA: Diagnosis not present

## 2020-02-15 DIAGNOSIS — O903 Peripartum cardiomyopathy: Secondary | ICD-10-CM | POA: Diagnosis not present

## 2020-02-15 DIAGNOSIS — Z452 Encounter for adjustment and management of vascular access device: Secondary | ICD-10-CM | POA: Diagnosis not present

## 2020-02-15 DIAGNOSIS — I5023 Acute on chronic systolic (congestive) heart failure: Secondary | ICD-10-CM | POA: Diagnosis not present

## 2020-02-15 DIAGNOSIS — E119 Type 2 diabetes mellitus without complications: Secondary | ICD-10-CM | POA: Diagnosis not present

## 2020-02-15 DIAGNOSIS — Z7984 Long term (current) use of oral hypoglycemic drugs: Secondary | ICD-10-CM | POA: Diagnosis not present

## 2020-02-17 ENCOUNTER — Other Ambulatory Visit (HOSPITAL_COMMUNITY): Payer: Self-pay

## 2020-02-17 NOTE — Progress Notes (Signed)
Paramedicine Encounter    [name], female    DOB: 1981-08-16, 39 y.o.   MRN: [id number]   Patient Care Team: System, Provider Not In as PCP - General Regan Lemming, MD as PCP - Electrophysiology (Cardiology) Laurey Morale, MD as PCP - Advanced Heart Failure (Cardiology)  Patient Active Problem List   Diagnosis Date Noted  . Acute on chronic systolic CHF (congestive heart failure) (HCC) 06/17/2019  . Obstructive sleep apnea 06/17/2019  . 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 Diabetes mellitus (HCC) 06/17/2019  . Amenorrhea 08/03/2018  . Congestive heart failure (HCC) 06/29/2018  . Hypotension 06/07/2018  . Rhinovirus infection 06/06/2018  . Asthma 06/05/2018  . Acute respiratory failure with hypoxia (HCC) 06/05/2018  . Hypokalemia 06/05/2018  . Snoring 09/23/2016  . Cough 04/10/2016  . 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 Tobacco abuse 01/25/2016  . Acute on chronic combined systolic and diastolic CHF (congestive heart failure) (HCC) 01/25/2016  . Mitral regurgitation   . Abnormal EKG-inf TWI 01/15/2016  . Positive D dimer-CTA neg for PE 01/15/2016  . Morbid obesity- BMI 57 01/14/2016  . Abdominal pain 01/14/2016  . 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 At risk for sleep apnea 01/14/2016  . Cardiomyopathy (HCC) 11/01/2015  . Morbid obesity with BMI of 50.0-59.9, adult (HCC) 08/16/2015    Current Outpatient Medications:  .  albuterol (PROAIR HFA) 108 (90 Base) MCG/ACT inhaler, Inhale 2 puffs into the lungs every 6 (six) hours as needed for wheezing or shortness of breath., Disp: , Rfl:  .  atorvastatin (LIPITOR) 10 MG tablet, Take 1 tablet (10 mg total) by mouth daily. (Patient taking differently: Take 10 mg by mouth daily. ), Disp: 90 tablet, Rfl: 3 .  BYDUREON 2 MG PEN, Inject 2 mg into the skin once a week., Disp: , Rfl:  .  carvedilol (COREG) 12.5 MG tablet, Take 1 tablet (12.5 mg total) by mouth 2  (two) times daily. Dose increase, Disp: 180 tablet, Rfl: 2 .  digoxin (LANOXIN) 0.125 MG tablet, Take 1 tablet (0.125 mg total) by mouth daily. (Patient taking differently: Take 0.125 mg by mouth daily. ), Disp: 90 tablet, Rfl: 3 .  ivabradine (CORLANOR) 7.5 MG TABS tablet, Take 1 tablet (7.5 mg total) by mouth 2 (two) times daily with a meal., Disp: 60 tablet, Rfl: 3 .  Magnesium Oxide 400 (240 Mg) MG TABS, Take 2 tablets by mouth every morning and 1 tablet by mouth every evening, Disp: 90 tablet, Rfl: 6 .  metolazone (ZAROXOLYN) 2.5 MG tablet, Take only as directed by the CHF clinic, Disp: 10 tablet, Rfl: 3 .  milrinone (PRIMACOR) 20 MG/100 ML SOLN infusion, Inject 0.0414 mg/min into the vein continuous., Disp:  , Rfl:  .  potassium chloride (KLOR-CON) 10 MEQ tablet, Take 80 meq (8 tabs) in the morning, 60 meq (6 tabs) in the afternoon, and 60 meq (6 tabs) in the evening (Patient taking differently: Take 80 meq (8 tabs) in the morning, 60 meq (6 tabs) in the afternoon, and 60 meq (6 tabs) in the evening), Disp: 600 tablet, Rfl: 5 .  PRESCRIPTION MEDICATION, CPAP- AT BEDTIME, Disp: , Rfl:  .  sacubitril-valsartan (ENTRESTO) 24-26 MG, Take 1 tablet by mouth 2 (two) times daily., Disp: 60 tablet, Rfl: 5 .  spironolactone (ALDACTONE) 25 MG tablet, Take 1 tablet (25 mg total) by mouth daily. (Patient taking differently: Take 25 mg by mouth daily. ), Disp: 90 tablet, Rfl: 3 .  torsemide (  DEMADEX) 20 MG tablet, Take 5 tablets (100 mg total) by mouth in the morning AND 4 tablets (80 mg total) every evening., Disp: 720 tablet, Rfl: 3 .  Exenatide ER (BYDUREON) 2 MG PEN, Inject into the skin., Disp: , Rfl:  .  ipratropium-albuterol (DUONEB) 0.5-2.5 (3) MG/3ML SOLN, Take 3 mLs by nebulization every 4 (four) hours as needed. (Patient not taking: Reported on 01/11/2020), Disp: 360 mL, Rfl: 0 .  MAGNESIUM-OXIDE 400 (241.3 Mg) MG tablet, Take 1 tablet by mouth 2 (two) times daily., Disp: , Rfl:  .  milrinone  (PRIMACOR) 20 MG/100 ML SOLN infusion, Inject into the vein., Disp: , Rfl:  .  ondansetron (ZOFRAN) 4 MG tablet, Take 1 tablet (4 mg total) by mouth every 8 (eight) hours as needed for nausea or vomiting. (Patient not taking: Reported on 01/04/2020), Disp: 20 tablet, Rfl: 0 .  potassium chloride (KLOR-CON) 20 MEQ packet, Take by mouth., Disp: , Rfl:  Allergies  Allergen Reactions  . Metformin And Related Diarrhea    And caused severe "dry mouth," also      Social History   Socioeconomic History  . Marital status: Single    Spouse name: Not on file  . Number of children: 2  . Years of education: Not on file  . Highest education level: Not on file  Occupational History  . Not on file  Tobacco Use  . Smoking status: Former Smoker    Quit date: 10/19/2015    Years since quitting: 4.3  . Smokeless tobacco: Never Used  Substance and Sexual Activity  . Alcohol use: Yes    Alcohol/week: 0.0 standard drinks    Comment: occasionally  . Drug use: Yes    Types: Marijuana    Comment: only socially  . Sexual activity: Yes  Other Topics Concern  . Not on file  Social History Narrative  . Not on file   Social Determinants of Health   Financial Resource Strain: High Risk  . Difficulty of Paying Living Expenses: Hard  Food Insecurity: Food Insecurity Present  . Worried About Charity fundraiser in the Last Year: Sometimes true  . Ran Out of Food in the Last Year: Sometimes true  Transportation Needs: No Transportation Needs  . Lack of Transportation (Medical): No  . Lack of Transportation (Non-Medical): No  Physical Activity:   . Days of Exercise per Week:   . Minutes of Exercise per Session:   Stress:   . Feeling of Stress :   Social Connections:   . Frequency of Communication with Friends and Family:   . Frequency of Social Gatherings with Friends and Family:   . Attends Religious Services:   . Active Member of Clubs or Organizations:   . Attends Archivist Meetings:    Marland Kitchen Marital Status:   Intimate Partner Violence:   . Fear of Current or Ex-Partner:   . Emotionally Abused:   Marland Kitchen Physically Abused:   . Sexually Abused:     Physical Exam      Future Appointments  Date Time Provider [place]  03/14/2020  3:00 PM [facility] PA/NP [facility] None  04/24/2020  7:45 AM [facility] DEVICE REMOTES [facility]  05/17/2020  1:40 PM Larey Dresser, MD [facility] None  07/24/2020  7:45 AM [facility] DEVICE REMOTES [facility]    BP (!) 104/0   Pulse 88   Temp (!) 97.3 F (36.3 C)   Resp 18   SpO2 98%   Weight yesterday-344 Last  visit weight-346  Pt reports she has been having several things go on lately.  She has been busy with both daughters finishing their school year.  She has been having episodes of panic attacks/anxiety b/c her fear of being alone and dying.  She experienced her mothers loss at a young age of 68 and she doesn't want her kids to go through the same thing and she is fearful that something will happen to her while she is alone and the kids will come find her like that. When her mother died she was just getting home and saw the ambulance pulling away with her.  We spoke about what is in her control and what is not, and what she can do to improve the things that are in her control and that area is her exercising that needs to be increased.  She states that the kids have a lot of different activities to do this summer so I told her that could be her time to go out and to look into [employer] since its not far and get a routine going, make new friends, set goals for herself etc.  With summer approaching it is going to become very difficult for her to go outside to get her walks/steps in so going to the gym in a controlled environment would be great for her, she can come and go as she pleases.  Her carvedilol was increased due to some fast beats per her ICD device check. She has not felt any palpitations since the  increased dose.  She denies any c/p. She didn't weigh today due to it being a busy day. She does feel bloated however she is on her period now so potentially coming from that. She took her metolazone yesterday.  Will f/u next week. meds verified and pill box refilled.  She has virtual therapy at 3pm today with a fairly new therapist and she isnt sure how she likes this one.    Kerry Hough, EMT-Paramedic [phone] [facility] Paramedic  02/17/20

## 2020-02-18 DIAGNOSIS — E119 Type 2 diabetes mellitus without complications: Secondary | ICD-10-CM | POA: Diagnosis not present

## 2020-02-18 DIAGNOSIS — Z452 Encounter for adjustment and management of vascular access device: Secondary | ICD-10-CM | POA: Diagnosis not present

## 2020-02-18 DIAGNOSIS — Z79899 Other long term (current) drug therapy: Secondary | ICD-10-CM | POA: Diagnosis not present

## 2020-02-18 DIAGNOSIS — Z5181 Encounter for therapeutic drug level monitoring: Secondary | ICD-10-CM | POA: Diagnosis not present

## 2020-02-18 DIAGNOSIS — F17210 Nicotine dependence, cigarettes, uncomplicated: Secondary | ICD-10-CM | POA: Diagnosis not present

## 2020-02-18 DIAGNOSIS — Z6841 Body Mass Index (BMI) 40.0 and over, adult: Secondary | ICD-10-CM | POA: Diagnosis not present

## 2020-02-18 DIAGNOSIS — Z7984 Long term (current) use of oral hypoglycemic drugs: Secondary | ICD-10-CM | POA: Diagnosis not present

## 2020-02-18 DIAGNOSIS — I340 Nonrheumatic mitral (valve) insufficiency: Secondary | ICD-10-CM | POA: Diagnosis not present

## 2020-02-18 DIAGNOSIS — I5023 Acute on chronic systolic (congestive) heart failure: Secondary | ICD-10-CM | POA: Diagnosis not present

## 2020-02-18 DIAGNOSIS — O903 Peripartum cardiomyopathy: Secondary | ICD-10-CM | POA: Diagnosis not present

## 2020-02-18 DIAGNOSIS — J45909 Unspecified asthma, uncomplicated: Secondary | ICD-10-CM | POA: Diagnosis not present

## 2020-02-21 DIAGNOSIS — I5023 Acute on chronic systolic (congestive) heart failure: Secondary | ICD-10-CM

## 2020-02-22 ENCOUNTER — Other Ambulatory Visit (HOSPITAL_COMMUNITY): Payer: Self-pay

## 2020-02-22 DIAGNOSIS — Z7984 Long term (current) use of oral hypoglycemic drugs: Secondary | ICD-10-CM | POA: Diagnosis not present

## 2020-02-22 DIAGNOSIS — O0903 Supervision of pregnancy with history of infertility, third trimester: Secondary | ICD-10-CM | POA: Diagnosis not present

## 2020-02-22 DIAGNOSIS — F17210 Nicotine dependence, cigarettes, uncomplicated: Secondary | ICD-10-CM | POA: Diagnosis not present

## 2020-02-22 DIAGNOSIS — J45909 Unspecified asthma, uncomplicated: Secondary | ICD-10-CM | POA: Diagnosis not present

## 2020-02-22 DIAGNOSIS — Z6841 Body Mass Index (BMI) 40.0 and over, adult: Secondary | ICD-10-CM | POA: Diagnosis not present

## 2020-02-22 DIAGNOSIS — E119 Type 2 diabetes mellitus without complications: Secondary | ICD-10-CM | POA: Diagnosis not present

## 2020-02-22 DIAGNOSIS — Z452 Encounter for adjustment and management of vascular access device: Secondary | ICD-10-CM | POA: Diagnosis not present

## 2020-02-22 DIAGNOSIS — Z5181 Encounter for therapeutic drug level monitoring: Secondary | ICD-10-CM | POA: Diagnosis not present

## 2020-02-22 DIAGNOSIS — O903 Peripartum cardiomyopathy: Secondary | ICD-10-CM | POA: Diagnosis not present

## 2020-02-22 DIAGNOSIS — I5023 Acute on chronic systolic (congestive) heart failure: Secondary | ICD-10-CM | POA: Diagnosis not present

## 2020-02-22 DIAGNOSIS — Z79899 Other long term (current) drug therapy: Secondary | ICD-10-CM | POA: Diagnosis not present

## 2020-02-22 DIAGNOSIS — I340 Nonrheumatic mitral (valve) insufficiency: Secondary | ICD-10-CM | POA: Diagnosis not present

## 2020-02-22 NOTE — Progress Notes (Signed)
Paramedicine Encounter    [name], female    DOB: Feb 24, 1981, 39 y.o.   MRN: [id number]   Patient Care Team: System, Provider Not In as PCP - General Regan Lemming, MD as PCP - Electrophysiology (Cardiology) Laurey Morale, MD as PCP - Advanced Heart Failure (Cardiology)  Patient Active Problem List   Diagnosis Date Noted   Acute on chronic systolic CHF (congestive heart failure) (HCC) 06/17/2019   Obstructive sleep apnea 06/17/2019   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   Diabetes mellitus (HCC) 06/17/2019   Amenorrhea 08/03/2018   Congestive heart failure (HCC) 06/29/2018   Hypotension 06/07/2018   Rhinovirus infection 06/06/2018   Asthma 06/05/2018   Acute respiratory failure with hypoxia (HCC) 06/05/2018   Hypokalemia 06/05/2018   Snoring 09/23/2016   Cough 04/10/2016   NICM (nonischemic cardiomyopathy) (HCC) 01/25/2016   Tobacco abuse 01/25/2016   Acute on chronic combined systolic and diastolic CHF (congestive heart failure) (HCC) 01/25/2016   Mitral regurgitation    Abnormal EKG-inf TWI 01/15/2016   Positive D dimer-CTA neg for PE 01/15/2016   Morbid obesity- BMI 57 01/14/2016   Abdominal pain 01/14/2016   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 (Patient taking differently: Take 10 mg by mouth daily. ), Disp: 90 tablet, Rfl: 3   carvedilol (COREG) 12.5 MG tablet, Take 1 tablet (12.5 mg total) by mouth 2 (two) times daily. Dose increase, Disp: 180 tablet, Rfl: 2   digoxin (LANOXIN) 0.125 MG tablet, Take 1 tablet (0.125 mg total) by mouth daily. (Patient taking differently: Take 0.125 mg by mouth daily. ), Disp: 90 tablet, Rfl: 3   Exenatide  ER (BYDUREON) 2 MG PEN, Inject into the skin., Disp: , Rfl:    ivabradine (CORLANOR) 7.5 MG TABS tablet, Take 1 tablet (7.5 mg total) by mouth 2 (two) times daily with a meal., Disp: 60 tablet, Rfl: 3   Magnesium Oxide 400 (240 Mg) MG TABS, Take 2 tablets by mouth every morning and 1 tablet by mouth every evening, Disp: 90 tablet, Rfl: 6   metolazone (ZAROXOLYN) 2.5 MG tablet, Take only as directed by the CHF clinic, Disp: 10 tablet, Rfl: 3   milrinone (PRIMACOR) 20 MG/100 ML SOLN infusion, Inject 0.0414 mg/min into the vein continuous., Disp:  , Rfl:    potassium chloride (KLOR-CON) 10 MEQ tablet, Take 80 meq (8 tabs) in the morning, 60 meq (6 tabs) in the afternoon, and 60 meq (6 tabs) in the evening (Patient taking differently: Take 80 meq (8 tabs) in the morning, 60 meq (6 tabs) in the afternoon, and 60 meq (6 tabs) in the evening), Disp: 600 tablet, Rfl: 5   sacubitril-valsartan (ENTRESTO) 24-26 MG, Take 1 tablet by mouth 2 (two) times daily., Disp: 60 tablet, Rfl: 5   spironolactone (ALDACTONE) 25 MG tablet, Take 1 tablet (25 mg total) by mouth daily. (Patient taking differently: Take 25 mg by mouth daily. ), Disp: 90 tablet, Rfl: 3   torsemide (DEMADEX) 20 MG tablet, Take 5 tablets (100 mg total) by mouth in the morning AND 4 tablets (80 mg total) every evening., Disp: 720 tablet, Rfl: 3   albuterol (PROAIR HFA) 108 (90 Base) MCG/ACT inhaler, Inhale 2 puffs into the lungs every 6 (  six) hours as needed for wheezing or shortness of breath., Disp: , Rfl:    BYDUREON 2 MG PEN, Inject 2 mg into the skin once a week., Disp: , Rfl:    ipratropium-albuterol (DUONEB) 0.5-2.5 (3) MG/3ML SOLN, Take 3 mLs by nebulization every 4 (four) hours as needed. (Patient not taking: Reported on 01/11/2020), Disp: 360 mL, Rfl: 0   MAGNESIUM-OXIDE 400 (241.3 Mg) MG tablet, Take 1 tablet by mouth 2 (two) times daily., Disp: , Rfl:    milrinone (PRIMACOR) 20 MG/100 ML SOLN infusion, Inject into the vein.,  Disp: , Rfl:    ondansetron (ZOFRAN) 4 MG tablet, Take 1 tablet (4 mg total) by mouth every 8 (eight) hours as needed for nausea or vomiting. (Patient not taking: Reported on 01/04/2020), Disp: 20 tablet, Rfl: 0   potassium chloride (KLOR-CON) 20 MEQ packet, Take by mouth., Disp: , Rfl:    PRESCRIPTION MEDICATION, CPAP- AT BEDTIME, Disp: , Rfl:  Allergies  Allergen Reactions   Metformin And Related Diarrhea    And caused severe "dry mouth," also      Social History   Socioeconomic History   Marital status: Single    Spouse name: Not on file   Number of children: 2   Years of education: Not on file   Highest education level: Not on file  Occupational History   Not on file  Tobacco Use   Smoking status: Former Smoker    Quit date: 10/19/2015    Years since quitting: 4.3   Smokeless tobacco: Never Used  Substance and Sexual Activity   Alcohol use: Yes    Alcohol/week: 0.0 standard drinks    Comment: occasionally   Drug use: Yes    Types: Marijuana    Comment: only socially   Sexual activity: Yes  Other Topics Concern   Not on file  Social History Narrative   Not on file   Social Determinants of Health   Financial Resource Strain: High Risk   Difficulty of Paying Living Expenses: Hard  Food Insecurity: Food Insecurity Present   Worried About Running Out of Food in the Last Year: Sometimes true   Ran Out of Food in the Last Year: Sometimes true  Transportation Needs: No Transportation Needs   Lack of Transportation (Medical): No   Lack of Transportation (Non-Medical): No  Physical Activity:    Days of Exercise per Week:    Minutes of Exercise per Session:   Stress:    Feeling of Stress :   Social Connections:    Frequency of Communication with Friends and Family:    Frequency of Social Gatherings with Friends and Family:    Attends Religious Services:    Active Member of Clubs or Organizations:    Attends Archivist Meetings:     Marital Status:   Intimate Partner Violence:    Fear of Current or Ex-Partner:    Emotionally Abused:    Physically Abused:    Sexually Abused:     Physical Exam      Future Appointments  Date Time Provider [place]  03/14/2020  3:00 PM [facility] PA/NP [facility] None  04/24/2020  7:45 AM [facility] DEVICE REMOTES [facility]  05/17/2020  1:40 PM Larey Dresser, MD [facility] None  07/24/2020  7:45 AM [facility] DEVICE REMOTES [facility]    BP 126/72    Pulse 75    Temp 97.7 F (36.5 C)    Resp 18    SpO2 98%   Weight  yesterday-343 Last visit weight-344 Weight today-343  Pt reports she is doing well. She denies sob, no dizziness, no c/p.  Her weight has came to a stand still.  Nurse came during our visit and changed out her milrinone pump. Nurse was concerned about the cath continuing to slowly come out. She states she will keep an eye on it and if it continues then she will let clinic know.  The nurse did speak to her about her weight staying same and she isnt making any progress with weight loss and what she is going to do should the milrinone is stopped and she replied she would die. Nurse told her that is correct she will die, pt quickly changed the subject and nothing more was spoken of it.  I have encouraged her to join [employer] and begin a routine, with summer weather approaching it is going to be very difficult for her to walk outside. She said she would look into it.  meds verified and pill box refilled.  Still not using CPAP.  Encouraged her to use it.    Kerry Hough, EMT-Paramedic [phone] [facility] Paramedic  02/22/20

## 2020-02-25 ENCOUNTER — Other Ambulatory Visit (HOSPITAL_COMMUNITY): Payer: Self-pay | Admitting: Cardiology

## 2020-02-29 DIAGNOSIS — O903 Peripartum cardiomyopathy: Secondary | ICD-10-CM | POA: Diagnosis not present

## 2020-02-29 DIAGNOSIS — Z79899 Other long term (current) drug therapy: Secondary | ICD-10-CM | POA: Diagnosis not present

## 2020-02-29 DIAGNOSIS — Z6841 Body Mass Index (BMI) 40.0 and over, adult: Secondary | ICD-10-CM | POA: Diagnosis not present

## 2020-02-29 DIAGNOSIS — Z452 Encounter for adjustment and management of vascular access device: Secondary | ICD-10-CM | POA: Diagnosis not present

## 2020-02-29 DIAGNOSIS — J45909 Unspecified asthma, uncomplicated: Secondary | ICD-10-CM | POA: Diagnosis not present

## 2020-02-29 DIAGNOSIS — Z5181 Encounter for therapeutic drug level monitoring: Secondary | ICD-10-CM | POA: Diagnosis not present

## 2020-02-29 DIAGNOSIS — I5023 Acute on chronic systolic (congestive) heart failure: Secondary | ICD-10-CM | POA: Diagnosis not present

## 2020-02-29 DIAGNOSIS — Z7984 Long term (current) use of oral hypoglycemic drugs: Secondary | ICD-10-CM | POA: Diagnosis not present

## 2020-02-29 DIAGNOSIS — E119 Type 2 diabetes mellitus without complications: Secondary | ICD-10-CM | POA: Diagnosis not present

## 2020-02-29 DIAGNOSIS — I340 Nonrheumatic mitral (valve) insufficiency: Secondary | ICD-10-CM | POA: Diagnosis not present

## 2020-02-29 DIAGNOSIS — F17210 Nicotine dependence, cigarettes, uncomplicated: Secondary | ICD-10-CM | POA: Diagnosis not present

## 2020-03-01 ENCOUNTER — Other Ambulatory Visit (HOSPITAL_COMMUNITY): Payer: Self-pay

## 2020-03-01 NOTE — Progress Notes (Signed)
Paramedicine Encounter    [name], female    DOB: Feb 24, 1981, 39 y.o.   MRN: [id number]   Patient Care Team: System, Provider Not In as PCP - General Regan Lemming, MD as PCP - Electrophysiology (Cardiology) Laurey Morale, MD as PCP - Advanced Heart Failure (Cardiology)  Patient Active Problem List   Diagnosis Date Noted   Acute on chronic systolic CHF (congestive heart failure) (HCC) 06/17/2019   Obstructive sleep apnea 06/17/2019   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   Diabetes mellitus (HCC) 06/17/2019   Amenorrhea 08/03/2018   Congestive heart failure (HCC) 06/29/2018   Hypotension 06/07/2018   Rhinovirus infection 06/06/2018   Asthma 06/05/2018   Acute respiratory failure with hypoxia (HCC) 06/05/2018   Hypokalemia 06/05/2018   Snoring 09/23/2016   Cough 04/10/2016   NICM (nonischemic cardiomyopathy) (HCC) 01/25/2016   Tobacco abuse 01/25/2016   Acute on chronic combined systolic and diastolic CHF (congestive heart failure) (HCC) 01/25/2016   Mitral regurgitation    Abnormal EKG-inf TWI 01/15/2016   Positive D dimer-CTA neg for PE 01/15/2016   Morbid obesity- BMI 57 01/14/2016   Abdominal pain 01/14/2016   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 (Patient taking differently: Take 10 mg by mouth daily. ), Disp: 90 tablet, Rfl: 3   carvedilol (COREG) 12.5 MG tablet, Take 1 tablet (12.5 mg total) by mouth 2 (two) times daily. Dose increase, Disp: 180 tablet, Rfl: 2   digoxin (LANOXIN) 0.125 MG tablet, Take 1 tablet (0.125 mg total) by mouth daily. (Patient taking differently: Take 0.125 mg by mouth daily. ), Disp: 90 tablet, Rfl: 3   Exenatide  ER (BYDUREON) 2 MG PEN, Inject into the skin., Disp: , Rfl:    ivabradine (CORLANOR) 7.5 MG TABS tablet, Take 1 tablet (7.5 mg total) by mouth 2 (two) times daily with a meal., Disp: 60 tablet, Rfl: 3   Magnesium Oxide 400 (240 Mg) MG TABS, Take 2 tablets by mouth every morning and 1 tablet by mouth every evening, Disp: 90 tablet, Rfl: 6   metolazone (ZAROXOLYN) 2.5 MG tablet, Take only as directed by the CHF clinic, Disp: 10 tablet, Rfl: 3   milrinone (PRIMACOR) 20 MG/100 ML SOLN infusion, Inject 0.0414 mg/min into the vein continuous., Disp:  , Rfl:    potassium chloride (KLOR-CON) 10 MEQ tablet, Take 80 meq (8 tabs) in the morning, 60 meq (6 tabs) in the afternoon, and 60 meq (6 tabs) in the evening (Patient taking differently: Take 80 meq (8 tabs) in the morning, 60 meq (6 tabs) in the afternoon, and 60 meq (6 tabs) in the evening), Disp: 600 tablet, Rfl: 5   sacubitril-valsartan (ENTRESTO) 24-26 MG, Take 1 tablet by mouth 2 (two) times daily., Disp: 60 tablet, Rfl: 5   spironolactone (ALDACTONE) 25 MG tablet, Take 1 tablet (25 mg total) by mouth daily. (Patient taking differently: Take 25 mg by mouth daily. ), Disp: 90 tablet, Rfl: 3   torsemide (DEMADEX) 20 MG tablet, Take 5 tablets (100 mg total) by mouth in the morning AND 4 tablets (80 mg total) every evening., Disp: 720 tablet, Rfl: 3   albuterol (PROAIR HFA) 108 (90 Base) MCG/ACT inhaler, Inhale 2 puffs into the lungs every 6 (  six) hours as needed for wheezing or shortness of breath. (Patient not taking: Reported on 03/01/2020), Disp: , Rfl:    BYDUREON 2 MG PEN, Inject 2 mg into the skin once a week. (Patient not taking: Reported on 03/01/2020), Disp: , Rfl:    ipratropium-albuterol (DUONEB) 0.5-2.5 (3) MG/3ML SOLN, Take 3 mLs by nebulization every 4 (four) hours as needed. (Patient not taking: Reported on 01/11/2020), Disp: 360 mL, Rfl: 0   MAGNESIUM-OXIDE 400 (241.3 Mg) MG tablet, Take 1 tablet by mouth 2 (two) times daily.  (Patient not taking: Reported on 03/01/2020), Disp: , Rfl:    milrinone (PRIMACOR) 20 MG/100 ML SOLN infusion, Inject into the vein. (Patient not taking: Reported on 03/01/2020), Disp: , Rfl:    ondansetron (ZOFRAN) 4 MG tablet, Take 1 tablet (4 mg total) by mouth every 8 (eight) hours as needed for nausea or vomiting. (Patient not taking: Reported on 01/04/2020), Disp: 20 tablet, Rfl: 0   potassium chloride (KLOR-CON) 20 MEQ packet, Take by mouth. (Patient not taking: Reported on 03/01/2020), Disp: , Rfl:    PRESCRIPTION MEDICATION, CPAP- AT BEDTIME (Patient not taking: Reported on 03/01/2020), Disp: , Rfl:  Allergies  Allergen Reactions   Metformin And Related Diarrhea    And caused severe "dry mouth," also      Social History   Socioeconomic History   Marital status: Single    Spouse name: Not on file   Number of children: 2   Years of education: Not on file   Highest education level: Not on file  Occupational History   Not on file  Tobacco Use   Smoking status: Former Smoker    Quit date: 10/19/2015    Years since quitting: 4.3   Smokeless tobacco: Never Used  Vaping Use   Vaping Use: Never assessed  Substance and Sexual Activity   Alcohol use: Yes    Alcohol/week: 0.0 standard drinks    Comment: occasionally   Drug use: Yes    Types: Marijuana    Comment: only socially   Sexual activity: Yes  Other Topics Concern   Not on file  Social History Narrative   Not on file   Social Determinants of Health   Financial Resource Strain: High Risk   Difficulty of Paying Living Expenses: Hard  Food Insecurity: Food Insecurity Present   Worried About Running Out of Food in the Last Year: Sometimes true   Ran Out of Food in the Last Year: Sometimes true  Transportation Needs: No Transportation Needs   Lack of Transportation (Medical): No   Lack of Transportation (Non-Medical): No  Physical Activity:    Days of Exercise per Week:    Minutes of Exercise  per Session:   Stress:    Feeling of Stress :   Social Connections:    Frequency of Communication with Friends and Family:    Frequency of Social Gatherings with Friends and Family:    Attends Religious Services:    Active Member of Clubs or Organizations:    Attends Banker Meetings:    Marital Status:   Intimate Partner Violence:    Fear of Current or Ex-Partner:    Emotionally Abused:    Physically Abused:    Sexually Abused:     Physical Exam      Future Appointments  Date Time Provider Department Center  03/14/2020  3:00 PM [facility] PA/NP [facility] None  04/24/2020  7:45 AM [facility] DEVICE REMOTES [facility]  05/17/2020  1:40 PM Marca Ancona  S, MD [facility] None  07/24/2020  7:45 AM [facility] DEVICE REMOTES [facility]    BP (!) 100/0    Pulse 78    Temp (!) 97.3 F (36.3 C)    Resp 18    SpO2 99%  Weight today-344 Weight yesterday-344 Last visit weight-343  Pt reports she is feeling ok, her daughters are at camp and have been gone all week. They will come back in a few days. She has a friend staying with her for now and will be leaving soon.  She plans on going to [employer] at [employer].  She has felt more sob and congested the past week and this is more at night time. She reports taking all her meds, her pill box was empty today. She is just waking up this morning a few min before I arrived so she is just taking her morn dose of meds. Today is her metolazone day. She was advised to call if she feels worse, her weight is maintaining same.  She is getting more tired doing household chores.  I think partly b/c she has not been exercising and walking like she had been doing.  Pt wants to swap PCP to [facility].  She reports her diet is staying the same. Eating meats and veggiesa and a shake or two during the day.  But very little exercise.  Denies c/p, no dizziness.    --corlanor, spiro, potassium  needs refilled.   [name], [facility] Paramedic  03/01/20

## 2020-03-07 DIAGNOSIS — Z79899 Other long term (current) drug therapy: Secondary | ICD-10-CM | POA: Diagnosis not present

## 2020-03-07 DIAGNOSIS — Z5181 Encounter for therapeutic drug level monitoring: Secondary | ICD-10-CM | POA: Diagnosis not present

## 2020-03-07 DIAGNOSIS — Z6841 Body Mass Index (BMI) 40.0 and over, adult: Secondary | ICD-10-CM | POA: Diagnosis not present

## 2020-03-07 DIAGNOSIS — Z7984 Long term (current) use of oral hypoglycemic drugs: Secondary | ICD-10-CM | POA: Diagnosis not present

## 2020-03-07 DIAGNOSIS — O903 Peripartum cardiomyopathy: Secondary | ICD-10-CM | POA: Diagnosis not present

## 2020-03-07 DIAGNOSIS — F17210 Nicotine dependence, cigarettes, uncomplicated: Secondary | ICD-10-CM | POA: Diagnosis not present

## 2020-03-07 DIAGNOSIS — I5023 Acute on chronic systolic (congestive) heart failure: Secondary | ICD-10-CM | POA: Diagnosis not present

## 2020-03-07 DIAGNOSIS — E119 Type 2 diabetes mellitus without complications: Secondary | ICD-10-CM | POA: Diagnosis not present

## 2020-03-07 DIAGNOSIS — I340 Nonrheumatic mitral (valve) insufficiency: Secondary | ICD-10-CM | POA: Diagnosis not present

## 2020-03-07 DIAGNOSIS — J45909 Unspecified asthma, uncomplicated: Secondary | ICD-10-CM | POA: Diagnosis not present

## 2020-03-07 DIAGNOSIS — Z452 Encounter for adjustment and management of vascular access device: Secondary | ICD-10-CM | POA: Diagnosis not present

## 2020-03-08 ENCOUNTER — Other Ambulatory Visit (HOSPITAL_COMMUNITY): Payer: Self-pay

## 2020-03-08 NOTE — Progress Notes (Signed)
Paramedicine Encounter    [name], female    DOB: 1981-04-07, 39 y.o.   MRN: [id number]   Patient Care Team: System, Provider Not In as PCP - General Regan Lemming, MD as PCP - Electrophysiology (Cardiology) Laurey Morale, MD as PCP - Advanced Heart Failure (Cardiology)  Patient Active Problem List   Diagnosis Date Noted  . Acute on chronic systolic CHF (congestive heart failure) (HCC) 06/17/2019  . Obstructive sleep apnea 06/17/2019  . 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 Diabetes mellitus (HCC) 06/17/2019  . Amenorrhea 08/03/2018  . Congestive heart failure (HCC) 06/29/2018  . Hypotension 06/07/2018  . Rhinovirus infection 06/06/2018  . Asthma 06/05/2018  . Acute respiratory failure with hypoxia (HCC) 06/05/2018  . Hypokalemia 06/05/2018  . Snoring 09/23/2016  . Cough 04/10/2016  . 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 Tobacco abuse 01/25/2016  . Acute on chronic combined systolic and diastolic CHF (congestive heart failure) (HCC) 01/25/2016  . Mitral regurgitation   . Abnormal EKG-inf TWI 01/15/2016  . Positive D dimer-CTA neg for PE 01/15/2016  . Morbid obesity- BMI 57 01/14/2016  . Abdominal pain 01/14/2016  . 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 At risk for sleep apnea 01/14/2016  . Cardiomyopathy (HCC) 11/01/2015  . Morbid obesity with BMI of 50.0-59.9, adult (HCC) 08/16/2015    Current Outpatient Medications:  .  atorvastatin (LIPITOR) 10 MG tablet, Take 1 tablet (10 mg total) by mouth daily. (Patient taking differently: Take 10 mg by mouth daily. ), Disp: 90 tablet, Rfl: 3 .  carvedilol (COREG) 12.5 MG tablet, Take 1 tablet (12.5 mg total) by mouth 2 (two) times daily. Dose increase, Disp: 180 tablet, Rfl: 2 .  digoxin (LANOXIN) 0.125 MG tablet, Take 1 tablet (0.125 mg total) by mouth daily. (Patient taking differently: Take 0.125 mg by mouth daily. ), Disp: 90 tablet, Rfl: 3 .  Exenatide  ER (BYDUREON) 2 MG PEN, Inject into the skin., Disp: , Rfl:  .  ivabradine (CORLANOR) 7.5 MG TABS tablet, Take 1 tablet (7.5 mg total) by mouth 2 (two) times daily with a meal., Disp: 60 tablet, Rfl: 3 .  Magnesium Oxide 400 (240 Mg) MG TABS, Take 2 tablets by mouth every morning and 1 tablet by mouth every evening, Disp: 90 tablet, Rfl: 6 .  metolazone (ZAROXOLYN) 2.5 MG tablet, Take only as directed by the CHF clinic (Patient taking differently: Take only as directed by the CHF clinic), Disp: 10 tablet, Rfl: 3 .  milrinone (PRIMACOR) 20 MG/100 ML SOLN infusion, Inject 0.0414 mg/min into the vein continuous., Disp:  , Rfl:  .  potassium chloride (KLOR-CON) 10 MEQ tablet, Take 80 meq (8 tabs) in the morning, 60 meq (6 tabs) in the afternoon, and 60 meq (6 tabs) in the evening (Patient taking differently: Take 80 meq (8 tabs) in the morning, 60 meq (6 tabs) in the afternoon, and 60 meq (6 tabs) in the evening), Disp: 600 tablet, Rfl: 5 .  sacubitril-valsartan (ENTRESTO) 24-26 MG, Take 1 tablet by mouth 2 (two) times daily., Disp: 60 tablet, Rfl: 5 .  spironolactone (ALDACTONE) 25 MG tablet, Take 1 tablet (25 mg total) by mouth daily. (Patient taking differently: Take 25 mg by mouth daily. ), Disp: 90 tablet, Rfl: 3 .  torsemide (DEMADEX) 20 MG tablet, Take 5 tablets (100 mg total) by mouth in the morning AND 4 tablets (80 mg total) every evening., Disp: 720 tablet, Rfl: 3 .  albuterol (PROAIR HFA) 108 (90  Base) MCG/ACT inhaler, Inhale 2 puffs into the lungs every 6 (six) hours as needed for wheezing or shortness of breath. (Patient not taking: Reported on 03/01/2020), Disp: , Rfl:  .  BYDUREON 2 MG PEN, Inject 2 mg into the skin once a week. (Patient not taking: Reported on 03/01/2020), Disp: , Rfl:  .  ipratropium-albuterol (DUONEB) 0.5-2.5 (3) MG/3ML SOLN, Take 3 mLs by nebulization every 4 (four) hours as needed. (Patient not taking: Reported on 01/11/2020), Disp: 360 mL, Rfl: 0 .  MAGNESIUM-OXIDE 400  (241.3 Mg) MG tablet, Take 1 tablet by mouth 2 (two) times daily. (Patient not taking: Reported on 03/01/2020), Disp: , Rfl:  .  milrinone (PRIMACOR) 20 MG/100 ML SOLN infusion, Inject into the vein. (Patient not taking: Reported on 03/08/2020), Disp: , Rfl:  .  ondansetron (ZOFRAN) 4 MG tablet, Take 1 tablet (4 mg total) by mouth every 8 (eight) hours as needed for nausea or vomiting. (Patient not taking: Reported on 01/04/2020), Disp: 20 tablet, Rfl: 0 .  potassium chloride (KLOR-CON) 20 MEQ packet, Take by mouth. (Patient not taking: Reported on 03/01/2020), Disp: , Rfl:  .  PRESCRIPTION MEDICATION, CPAP- AT BEDTIME (Patient not taking: Reported on 03/01/2020), Disp: , Rfl:  Allergies  Allergen Reactions  . Metformin And Related Diarrhea    And caused severe "dry mouth," also      Social History   Socioeconomic History  . Marital status: Single    Spouse name: Not on file  . Number of children: 2  . Years of education: Not on file  . Highest education level: Not on file  Occupational History  . Not on file  Tobacco Use  . Smoking status: Former Smoker    Quit date: 10/19/2015    Years since quitting: 4.3  . Smokeless tobacco: Never Used  Vaping Use  . Vaping Use: Never assessed  Substance and Sexual Activity  . Alcohol use: Yes    Alcohol/week: 0.0 standard drinks    Comment: occasionally  . Drug use: Yes    Types: Marijuana    Comment: only socially  . Sexual activity: Yes  Other Topics Concern  . Not on file  Social History Narrative  . Not on file   Social Determinants of Health   Financial Resource Strain: High Risk  . Difficulty of Paying Living Expenses: Hard  Food Insecurity: Food Insecurity Present  . Worried About Charity fundraiser in the Last Year: Sometimes true  . Ran Out of Food in the Last Year: Sometimes true  Transportation Needs: No Transportation Needs  . Lack of Transportation (Medical): No  . Lack of Transportation (Non-Medical): No  Physical  Activity:   . Days of Exercise per Week:   . Minutes of Exercise per Session:   Stress:   . Feeling of Stress :   Social Connections:   . Frequency of Communication with Friends and Family:   . Frequency of Social Gatherings with Friends and Family:   . Attends Religious Services:   . Active Member of Clubs or Organizations:   . Attends Archivist Meetings:   Marland Kitchen Marital Status:   Intimate Partner Violence:   . Fear of Current or Ex-Partner:   . Emotionally Abused:   Marland Kitchen Physically Abused:   . Sexually Abused:     Physical Exam      Future Appointments  Date Time Provider [place]  03/14/2020  3:00 PM [facility] PA/NP [facility] None  04/24/2020  7:45 AM [facility]  DEVICE REMOTES [facility]  05/17/2020  1:40 PM Laurey Morale, MD [facility] None  07/24/2020  7:45 AM [facility] DEVICE REMOTES [facility]    BP (!) 108/0   Pulse 94   Temp 98 F (36.7 C)   Resp 18   SpO2 98%   Weight yesterday-347 Last visit weight-344  Pt reports she is doing ok. She just waking up when I arrived.  She is out of the bydureon-she has left her PCP office without having another PCP in place.   Her PCP is T. Anderson at [facility] in [address]  I offered to call that office for her to ask for a courtesy fill but she didn't want me to.  So we need to get her in to be seen at another doc office asap. She is completely out of the bydureon.   She had asked about traveling and if it was safe to do that. I told her we would have to discuss that with the doctor. She mentioned she got invited out to [place] while she was at a party over then wknd but would have to fly out there alone. I told her that was dangerous and not a good idea.  She denies missing any meds.  meds verified and pill box refilled.  Her weight was up yesterday. She had some curry/spiced chicken the other day.  She is thinking about trying the topamax.  Didn't weigh this morning.   Concerned pt does not have the motivation as she has shown in the past for exercise routine and to lose weight. I have encouraged her on each visit.   Needs refills on--entresto  Kerry Hough, EMT-Paramedic [phone] [facility] Paramedic  03/08/20

## 2020-03-09 ENCOUNTER — Telehealth (HOSPITAL_COMMUNITY): Payer: Self-pay | Admitting: Licensed Clinical Social Worker

## 2020-03-09 NOTE — Telephone Encounter (Signed)
CSW had scheduled walk with pt this morning.  CSW and pt walked 1.2 miles and went by [facility] and completed new patient packet for pt to establish with a PCP in this office.  CSW and pt then met with [facility] rep, Pam, to help pt fill out financial hardship paperwork so pt can receive supplies for milrinone infusion through their charity program.  Pt then reports she turned in paperwork to [facility] for her to continue getting disability on 6/7 but hadn't heard anything back.  CSW and pt called [employer] to check in regarding this paperwork and pt informed that it had not arrived.  Confirmed fax number and pt will re fax once she gets back home- CSW encouraged pt to be calling daily to check in on status as she is concerned about having disability benefits revoked if she does not turn it in on time.  CSW will continue to follow and assist as needed  Jorge Ny, [facility] Desk#: [phone] Cell#: [phone]

## 2020-03-10 ENCOUNTER — Other Ambulatory Visit (HOSPITAL_COMMUNITY): Payer: Self-pay | Admitting: Cardiology

## 2020-03-13 NOTE — Progress Notes (Addendum)
Advanced [facility] Failure Clinic Note   Referring Physician:PCP: System, Provider Not In PCP-Cardiologist: Dr. Aundra Dubin   HPI: 39 y/o female with super morbid obesity, DM2, OSA, chronic systolic HF due to severe NICM (EF ~20%), and medtronic ICD.   She presented to the ER on 06/17/19 with several days of worsening HF with SOB at rest, marked edema and orthopnea/PND, not responding to increased doses of oral diuretics. She admitted to high levels of water and Gatorade intake.   Her admit wt was 380 lb (Dry wt ~360 lb). She was started on IV Lasix but had initial poor urinary response. PO metolazone added as adjunct. Repeat echo was obtained and showed EF <20%, relatively normal RV, moderate MR. RHC 10/5 with low output (CI 1.8) and elevated PCWP but near normal RA pressure. She was started on inotropic therapy w/ milrinone for low output, 0.25 mcg/kg/min. Also treated w/ Entresto, Coreg, spironolactone, digoxin and ivabradine. She was later transitioned off of IV Lasix to po torsemide 80 mg qam/60 mg qpm.   Unfortunately, she was not a transplant candidate due to her morbid obesity. We discussed her at [facility] and Dr. Prescott Gum has seen. She will need significant weight loss to get to LVAD. Will use home milrinone to try to facilitate increased activity to work towards weight loss. She understands that milrinone is not a good end-point and that we would like to use it as a bridge to eventual LVAD. RV function seems adequate for LVAD. Pt is motivated to lose weight. Tunnel cath was placed for home milrinone and home health was arranged. She was discharged home on 10/9 on milrinone 0.25 mcg.   06/27/19 she presented to the [facility] w/ CC of palpitations. Was brought in by EMS and was reportedly in SVT with HR in the 180s. She was given adenosine and converted to NSR prior to arrival to the ED. In the ED she was stable and symptoms had resolved. Labs showed normal CBC. BMP showed hypokalemia (3.0),  hyperglycemia (243), mildly elevated BUN/SCr, and low Mag, 1.5. She was given K and Mg supplementation and discharged home from the ED.  PICC exchanged 10/15/19  Today she returns for HF follow up.Overall feeling fine. Denies SOB/PND/Orthopnea. Able to walk 1 mile with HFSW about once a week.  Appetite ok. No fever or chills. Weight at home  347 pounds. She has not been using CPAP.  Did not take meds today because she was running errands. Has been followed by [facility]. Says she joined [employer] but she has not gone.   RHC Procedural Findings (10/5): Hemodynamics (mmHg) RA mean 7 RV 49/10 PA 65/25 mean 45 PCWP mean 27 Oxygen saturations: PA 57% AO 100% Cardiac Output (Fick) 4.65  Cardiac Index (Fick) 1.82 PVR 3.87 WU PAPI 5.7  2D Echo 06/18/19  Left ventricular ejection fraction, by visual estimation, is <20%. The left ventricle has severely decreased function. Severely increased left ventricular size. There is no left ventricular hypertrophy. Severe global hypokinesis. 2. Left ventricular diastolic Doppler parameters are indeterminate pattern of LV diastolic filling. 3. Global right ventricle has low normal systolic function.The right ventricular size is normal. No increase in right ventricular wall thickness. 4. Left atrial size was severely dilated. 5. Right atrial size was mildly dilated. 6. The mitral valve is abnormal. Moderate mitral valve regurgitation. 7. The tricuspid valve is grossly normal. Tricuspid valve regurgitation is trivial. 8. The aortic valve is tricuspid Aortic valve regurgitation was not visualized by color flow Doppler.  9. The pulmonic valve was grossly normal. Pulmonic valve regurgitation is not visualized by color flow Doppler. 10. Mildly elevated pulmonary artery systolic pressure. 11. A pacer wire is visualized. 12. The inferior vena cava is dilated in size with >50% respiratory variability, suggesting right atrial pressure of 8 mmHg. 13.  The interatrial septum was not well visualized.+   Past Medical History:  Diagnosis Date  . Asthma   . Chronic systolic CHF (congestive heart failure) ([place])    a. ECHO 01/15/2016 EF 10-15%. 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  . Diabetes mellitus ([place]) 06/17/2019  . Hearing loss    bilateral  . Mitral regurgitation    a. severe, felt to be functional 2/2 LV dilation   . Morbid obesity ([place])   . Snoring     Current Outpatient Medications  Medication Sig Dispense Refill  . albuterol (PROAIR HFA) 108 (90 Base) MCG/ACT inhaler Inhale 2 puffs into the lungs every 6 (six) hours as needed for wheezing or shortness of breath.     Marland Kitchen atorvastatin (LIPITOR) 10 MG tablet Take 1 tablet (10 mg total) by mouth daily. (Patient taking differently: Take 10 mg by mouth daily. ) 90 tablet 3  . BYDUREON 2 MG PEN Inject 2 mg into the skin once a week.     . carvedilol (COREG) 12.5 MG tablet Take 1 tablet (12.5 mg total) by mouth 2 (two) times daily. Dose increase 180 tablet 2  . digoxin (LANOXIN) 0.125 MG tablet Take 1 tablet (0.125 mg total) by mouth daily. (Patient taking differently: Take 0.125 mg by mouth daily. ) 90 tablet 3  . Exenatide ER (BYDUREON) 2 MG PEN Inject into the skin.    Marland Kitchen ipratropium-albuterol (DUONEB) 0.5-2.5 (3) MG/3ML SOLN Take 3 mLs by nebulization every 4 (four) hours as needed. 360 mL 0  . ivabradine (CORLANOR) 7.5 MG TABS tablet Take 1 tablet (7.5 mg total) by mouth 2 (two) times daily with a meal. 60 tablet 3  . Magnesium Oxide 400 (240 Mg) MG TABS Take 2 tablets by mouth every morning and 1 tablet by mouth every evening 90 tablet 6  . MAGNESIUM-OXIDE 400 (241.3 Mg) MG tablet Take 1 tablet by mouth 2 (two) times daily.     . metolazone (ZAROXOLYN) 2.5 MG tablet Take only as directed by the CHF clinic (Patient taking differently: 2.5 mg once a week. Take only as directed by the CHF clinic) 10 tablet 3  . milrinone (PRIMACOR) 20 MG/100 ML SOLN infusion Inject 0.0414 mg/min into  the vein continuous.    . milrinone (PRIMACOR) 20 MG/100 ML SOLN infusion Inject into the vein.     Marland Kitchen ondansetron (ZOFRAN) 4 MG tablet Take 1 tablet (4 mg total) by mouth every 8 (eight) hours as needed for nausea or vomiting. 20 tablet 0  . potassium chloride (KLOR-CON) 10 MEQ tablet Take 80 meq (8 tabs) in the morning, 60 meq (6 tabs) in the afternoon, and 60 meq (6 tabs) in the evening (Patient taking differently: Take 80 meq (8 tabs) in the morning, 60 meq (6 tabs) in the afternoon, and 60 meq (6 tabs) in the evening) 600 tablet 5  . potassium chloride (KLOR-CON) 20 MEQ packet Take by mouth.     Marland Kitchen PRESCRIPTION MEDICATION CPAP- AT BEDTIME     . sacubitril-valsartan (ENTRESTO) 24-26 MG Take 1 tablet by mouth 2 (two) times daily. 60 tablet 5  . spironolactone (ALDACTONE) 25 MG tablet Take 1 tablet (25 mg total) by  mouth daily. (Patient taking differently: Take 25 mg by mouth daily. ) 90 tablet 3  . torsemide (DEMADEX) 20 MG tablet Take 5 tablets (100 mg total) by mouth in the morning AND 4 tablets (80 mg total) every evening. 720 tablet 3   No current facility-administered medications for this encounter.    Allergies  Allergen Reactions  . Metformin And Related Diarrhea    And caused severe "dry mouth," also      Social History   Socioeconomic History  . Marital status: Single    Spouse name: Not on file  . Number of children: 2  . Years of education: Not on file  . Highest education level: Not on file  Occupational History  . Not on file  Tobacco Use  . Smoking status: Former Smoker    Quit date: 10/19/2015    Years since quitting: 4.4  . Smokeless tobacco: Never Used  Vaping Use  . Vaping Use: Never assessed  Substance and Sexual Activity  . Alcohol use: Yes    Alcohol/week: 0.0 standard drinks    Comment: occasionally  . Drug use: Yes    Types: Marijuana    Comment: only socially  . Sexual activity: Yes  Other Topics Concern  . Not on file  Social History Narrative   . Not on file   Social Determinants of Health   Financial Resource Strain: High Risk  . Difficulty of Paying Living Expenses: Hard  Food Insecurity: Food Insecurity Present  . Worried About Charity fundraiser in the Last Year: Sometimes true  . Ran Out of Food in the Last Year: Sometimes true  Transportation Needs: No Transportation Needs  . Lack of Transportation (Medical): No  . Lack of Transportation (Non-Medical): No  Physical Activity:   . Days of Exercise per Week:   . Minutes of Exercise per Session:   Stress:   . Feeling of Stress :   Social Connections:   . Frequency of Communication with Friends and Family:   . Frequency of Social Gatherings with Friends and Family:   . Attends Religious Services:   . Active Member of Clubs or Organizations:   . Attends Archivist Meetings:   Marland Kitchen Marital Status:   Intimate Partner Violence:   . Fear of Current or Ex-Partner:   . Emotionally Abused:   Marland Kitchen Physically Abused:   . Sexually Abused:       Family History  Problem Relation Age of Onset  . Heart attack Mother   . Asthma Mother   . Heart failure Mother   . Hypertension Father   . Diabetes Brother     Vitals:   03/14/20 1523  BP: 138/84  Pulse: (!) 110  SpO2: 97%  Weight: (!) 158.7 kg (349 lb 12.8 oz)   Wt Readings from Last 3 Encounters:  03/14/20 (!) 158.7 kg (349 lb 12.8 oz)  02/09/20 (!) 156.9 kg (346 lb)  02/01/20 (!) 156.2 kg (344 lb 6.4 oz)    PHYSICAL EXAM General:  Well appearing. No resp difficulty, Daughter present.  HEENT: normal Neck: supple. no JVD. Carotids 2+ bilat; no bruits. No lymphadenopathy or thryomegaly appreciated. Cor: PMI nondisplaced. Regular rate & rhythm. No rubs, gallops or murmurs. R upper chest tunneled PICC  Lungs: clear Abdomen: obese, soft, nontender, nondistended. No hepatosplenomegaly. No bruits or masses. Good bowel sounds. Extremities: no cyanosis, clubbing, rash, edema Neuro: alert & orientedx3, cranial  nerves grossly intact. moves all 4 extremities w/o difficulty. Affect pleasant  ASSESSMENT & PLAN: 1. Chronic Systolic HF w/ Low Output/ NICM: Echo 06/2019 with EF < 20%, relatively normal RV, moderate MR. NICM with Medtronic ICD. Recent acute exacerbation in the setting of dietary indiscretion w/ sodium. Massive diuresis w/ IV Lasix, ~17 lb. D/c dry wt 357 lb.  RHC 10/5 with low output (CI 1.8) and elevated PCWP but near normal RA pressure, started on milrinone 0.25 w/ plans to continue home milrinone as bridge to possible LVAD if significant weight loss. If she has issues with PICC may need to try off milrinone and see how she does.  Optivol: Activity ~ 2 hours No VT/AF Fluid index below threshold but impedance down. Not sure how often she is taking her diuretics.   - NYHA II. Volume status stable. Continue current diuretic regimen.  -  Continue current dose of coreg.   - Continue Entresto 24/26 bid.  - Continue ivabradine to 7.5 mg twice a day.   - Continue spironolactone 25 daily.  - Continue digoxin 0.125 mg - VAD consideration: Her size and social support will be issues.  She would not be a transplant candidate at this point due to weight. We discussed her at [facility], Dr. Prescott Gum has seen.  She will need significant weight loss to get to LVAD.  Will use home milrinone to try to facilitate increased activity to work towards weight loss.  She understands that milrinone is not a good end-point and that we would like to use it as a bridge to eventual LVAD. RV function seems adequate for LVAD.  2. Morbid obesity:  Body mass index is 58.21 kg/m.  Needs to [facility] with  [facility]. 3. SVT: had ED visit 10/11 for SVT noted by EMS that converted back to NSR after dose of adenosine. This was in the setting of hypokalemia and hypomagnesemia, at 3.0 and 1.5 respectively. Also in the setting of inotropic therapy w/ milrinone.   -No episode on device interrogation.  4. OSA Not using CPAP.  We discussed.  5. DMII -Per PCP. 06/17/19 Hgb A1C 9.4  - Consider SGT2I with weight loss.  She has been referred to [facility] for follow up.  HFSW met with her. Encouraged to take her medications and use CPAP.  Follow up with Dr Aundra Dubin in 8 weeks.    Darrick Grinder, NP 03/14/20

## 2020-03-14 ENCOUNTER — Ambulatory Visit (HOSPITAL_COMMUNITY)
Admission: RE | Admit: 2020-03-14 | Discharge: 2020-03-14 | Disposition: A | Discharge status: Home / Self Care | Payer: Medicaid Other | Source: Ambulatory Visit | Attending: Adult Health | Admitting: Adult Health

## 2020-03-14 ENCOUNTER — Other Ambulatory Visit: Payer: Self-pay

## 2020-03-14 ENCOUNTER — Encounter (HOSPITAL_COMMUNITY): Payer: Self-pay

## 2020-03-14 VITALS — BP 138/84 | HR 110 | Wt 349.8 lb

## 2020-03-14 DIAGNOSIS — I5023 Acute on chronic systolic (congestive) heart failure: Secondary | ICD-10-CM | POA: Diagnosis not present

## 2020-03-14 DIAGNOSIS — I428 Other cardiomyopathies: Secondary | ICD-10-CM | POA: Insufficient documentation

## 2020-03-14 DIAGNOSIS — Z825 Family history of asthma and other chronic lower respiratory diseases: Secondary | ICD-10-CM | POA: Insufficient documentation

## 2020-03-14 DIAGNOSIS — Z79899 Other long term (current) drug therapy: Secondary | ICD-10-CM | POA: Diagnosis not present

## 2020-03-14 DIAGNOSIS — Z95810 Presence of automatic (implantable) cardiac defibrillator: Secondary | ICD-10-CM | POA: Diagnosis not present

## 2020-03-14 DIAGNOSIS — E119 Type 2 diabetes mellitus without complications: Secondary | ICD-10-CM | POA: Insufficient documentation

## 2020-03-14 DIAGNOSIS — J45909 Unspecified asthma, uncomplicated: Secondary | ICD-10-CM | POA: Insufficient documentation

## 2020-03-14 DIAGNOSIS — Z833 Family history of diabetes mellitus: Secondary | ICD-10-CM | POA: Diagnosis not present

## 2020-03-14 DIAGNOSIS — H9193 Unspecified hearing loss, bilateral: Secondary | ICD-10-CM | POA: Diagnosis not present

## 2020-03-14 DIAGNOSIS — I5022 Chronic systolic (congestive) heart failure: Secondary | ICD-10-CM | POA: Diagnosis not present

## 2020-03-14 DIAGNOSIS — G4730 Sleep apnea, unspecified: Secondary | ICD-10-CM | POA: Diagnosis not present

## 2020-03-14 DIAGNOSIS — I471 Supraventricular tachycardia: Secondary | ICD-10-CM | POA: Diagnosis not present

## 2020-03-14 DIAGNOSIS — Z87891 Personal history of nicotine dependence: Secondary | ICD-10-CM | POA: Insufficient documentation

## 2020-03-14 DIAGNOSIS — Z452 Encounter for adjustment and management of vascular access device: Secondary | ICD-10-CM | POA: Diagnosis not present

## 2020-03-14 DIAGNOSIS — Z888 Allergy status to other drugs, medicaments and biological substances status: Secondary | ICD-10-CM | POA: Diagnosis not present

## 2020-03-14 DIAGNOSIS — Z8249 Family history of ischemic heart disease and other diseases of the circulatory system: Secondary | ICD-10-CM | POA: Diagnosis not present

## 2020-03-14 DIAGNOSIS — O903 Peripartum cardiomyopathy: Secondary | ICD-10-CM | POA: Diagnosis not present

## 2020-03-14 DIAGNOSIS — Z6841 Body Mass Index (BMI) 40.0 and over, adult: Secondary | ICD-10-CM | POA: Insufficient documentation

## 2020-03-14 DIAGNOSIS — G4733 Obstructive sleep apnea (adult) (pediatric): Secondary | ICD-10-CM | POA: Diagnosis not present

## 2020-03-14 DIAGNOSIS — Z7984 Long term (current) use of oral hypoglycemic drugs: Secondary | ICD-10-CM | POA: Diagnosis not present

## 2020-03-14 DIAGNOSIS — F17210 Nicotine dependence, cigarettes, uncomplicated: Secondary | ICD-10-CM | POA: Diagnosis not present

## 2020-03-14 DIAGNOSIS — I340 Nonrheumatic mitral (valve) insufficiency: Secondary | ICD-10-CM | POA: Diagnosis not present

## 2020-03-14 DIAGNOSIS — Z5181 Encounter for therapeutic drug level monitoring: Secondary | ICD-10-CM | POA: Diagnosis not present

## 2020-03-14 NOTE — Progress Notes (Signed)
Verbal orders given to [facility] to continue services 1x week for 6 weeks  [phone]

## 2020-03-14 NOTE — Patient Instructions (Signed)
It was great to see you today! No medication changes are needed at this time.  Keep follow up as scheduled with Dr Shirlee Latch  Do the following things EVERYDAY: 1) Weigh yourself in the morning before breakfast. Write it down and keep it in a log. 2) Take your medicines as prescribed 3) Eat low salt foods--Limit salt (sodium) to 2000 mg per day.  4) Stay as active as you can everyday 5) Limit all fluids for the day to less than 2 liters  At the [facility], you and your health needs are our priority. As part of our continuing mission to provide you with exceptional heart care, we have created designated Provider Care Teams. These Care Teams include your primary Cardiologist (physician) and Advanced Practice Providers (APPs- Physician Assistants and Nurse Practitioners) who all work together to provide you with the care you need, when you need it.   You may see any of the following providers on your designated Care Team at your next follow up: Marland Kitchen Dr Arvilla Meres . Dr Marca Ancona . Tonye Becket, NP . Robbie Lis, PA . Karle Plumber, PharmD   Please be sure to bring in all your medications bottles to every appointment.

## 2020-03-15 ENCOUNTER — Other Ambulatory Visit (HOSPITAL_COMMUNITY): Payer: Self-pay

## 2020-03-15 NOTE — Progress Notes (Signed)
Paramedicine Encounter    [name], female    DOB: 31-Jul-1981, 39 y.o.   MRN: [id number]   Patient Care Team: System, Provider Not In as PCP - General Regan Lemming, MD as PCP - Electrophysiology (Cardiology) Laurey Morale, MD as PCP - Advanced Heart Failure (Cardiology)  Patient Active Problem List   Diagnosis Date Noted  . Acute on chronic systolic CHF (congestive heart failure) (HCC) 06/17/2019  . Obstructive sleep apnea 06/17/2019  . 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 Diabetes mellitus (HCC) 06/17/2019  . Amenorrhea 08/03/2018  . Congestive heart failure (HCC) 06/29/2018  . Hypotension 06/07/2018  . Rhinovirus infection 06/06/2018  . Asthma 06/05/2018  . Acute respiratory failure with hypoxia (HCC) 06/05/2018  . Hypokalemia 06/05/2018  . Snoring 09/23/2016  . Cough 04/10/2016  . 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 Tobacco abuse 01/25/2016  . Acute on chronic combined systolic and diastolic CHF (congestive heart failure) (HCC) 01/25/2016  . Mitral regurgitation   . Abnormal EKG-inf TWI 01/15/2016  . Positive D dimer-CTA neg for PE 01/15/2016  . Morbid obesity- BMI 57 01/14/2016  . Abdominal pain 01/14/2016  . 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 At risk for sleep apnea 01/14/2016  . Cardiomyopathy (HCC) 11/01/2015  . Morbid obesity with BMI of 50.0-59.9, adult (HCC) 08/16/2015    Current Outpatient Medications:  .  albuterol (PROAIR HFA) 108 (90 Base) MCG/ACT inhaler, Inhale 2 puffs into the lungs every 6 (six) hours as needed for wheezing or shortness of breath. , Disp: , Rfl:  .  atorvastatin (LIPITOR) 10 MG tablet, Take 1 tablet (10 mg total) by mouth daily. (Patient taking differently: Take 10 mg by mouth daily. ), Disp: 90 tablet, Rfl: 3 .  BYDUREON 2 MG PEN, Inject 2 mg into the skin once a week. , Disp: , Rfl:  .  carvedilol (COREG) 12.5 MG tablet, Take 1 tablet (12.5 mg total) by mouth  2 (two) times daily. Dose increase, Disp: 180 tablet, Rfl: 2 .  digoxin (LANOXIN) 0.125 MG tablet, Take 1 tablet (0.125 mg total) by mouth daily. (Patient taking differently: Take 0.125 mg by mouth daily. ), Disp: 90 tablet, Rfl: 3 .  ivabradine (CORLANOR) 7.5 MG TABS tablet, Take 1 tablet (7.5 mg total) by mouth 2 (two) times daily with a meal., Disp: 60 tablet, Rfl: 3 .  Magnesium Oxide 400 (240 Mg) MG TABS, Take 2 tablets by mouth every morning and 1 tablet by mouth every evening, Disp: 90 tablet, Rfl: 6 .  metolazone (ZAROXOLYN) 2.5 MG tablet, Take only as directed by the CHF clinic (Patient taking differently: 2.5 mg once a week. Take only as directed by the CHF clinic), Disp: 10 tablet, Rfl: 3 .  milrinone (PRIMACOR) 20 MG/100 ML SOLN infusion, Inject 0.0414 mg/min into the vein continuous., Disp:  , Rfl:  .  potassium chloride (KLOR-CON) 10 MEQ tablet, Take 80 meq (8 tabs) in the morning, 60 meq (6 tabs) in the afternoon, and 60 meq (6 tabs) in the evening (Patient taking differently: Take 80 meq (8 tabs) in the morning, 60 meq (6 tabs) in the afternoon, and 60 meq (6 tabs) in the evening), Disp: 600 tablet, Rfl: 5 .  sacubitril-valsartan (ENTRESTO) 24-26 MG, Take 1 tablet by mouth 2 (two) times daily., Disp: 60 tablet, Rfl: 5 .  spironolactone (ALDACTONE) 25 MG tablet, Take 1 tablet (25 mg total) by mouth daily. (Patient taking differently: Take 25 mg by mouth daily. ), Disp:  90 tablet, Rfl: 3 .  torsemide (DEMADEX) 20 MG tablet, Take 5 tablets (100 mg total) by mouth in the morning AND 4 tablets (80 mg total) every evening., Disp: 720 tablet, Rfl: 3 .  Exenatide ER (BYDUREON) 2 MG PEN, Inject into the skin. (Patient not taking: Reported on 03/15/2020), Disp: , Rfl:  .  ipratropium-albuterol (DUONEB) 0.5-2.5 (3) MG/3ML SOLN, Take 3 mLs by nebulization every 4 (four) hours as needed. (Patient not taking: Reported on 03/15/2020), Disp: 360 mL, Rfl: 0 .  MAGNESIUM-OXIDE 400 (241.3 Mg) MG tablet, Take  1 tablet by mouth 2 (two) times daily.  (Patient not taking: Reported on 03/15/2020), Disp: , Rfl:  .  milrinone (PRIMACOR) 20 MG/100 ML SOLN infusion, Inject into the vein.  (Patient not taking: Reported on 03/15/2020), Disp: , Rfl:  .  ondansetron (ZOFRAN) 4 MG tablet, Take 1 tablet (4 mg total) by mouth every 8 (eight) hours as needed for nausea or vomiting. (Patient not taking: Reported on 03/15/2020), Disp: 20 tablet, Rfl: 0 .  potassium chloride (KLOR-CON) 20 MEQ packet, Take by mouth.  (Patient not taking: Reported on 03/15/2020), Disp: , Rfl:  .  PRESCRIPTION MEDICATION, CPAP- AT BEDTIME  (Patient not taking: Reported on 03/15/2020), Disp: , Rfl:  Allergies  Allergen Reactions  . Metformin And Related Diarrhea    And caused severe "dry mouth," also      Social History   Socioeconomic History  . Marital status: Single    Spouse name: Not on file  . Number of children: 2  . Years of education: Not on file  . Highest education level: Not on file  Occupational History  . Not on file  Tobacco Use  . Smoking status: Former Smoker    Quit date: 10/19/2015    Years since quitting: 4.4  . Smokeless tobacco: Never Used  Vaping Use  . Vaping Use: Never assessed  Substance and Sexual Activity  . Alcohol use: Yes    Alcohol/week: 0.0 standard drinks    Comment: occasionally  . Drug use: Yes    Types: Marijuana    Comment: only socially  . Sexual activity: Yes  Other Topics Concern  . Not on file  Social History Narrative  . Not on file   Social Determinants of Health   Financial Resource Strain: High Risk  . Difficulty of Paying Living Expenses: Hard  Food Insecurity: Food Insecurity Present  . Worried About Programme researcher, broadcasting/film/video in the Last Year: Sometimes true  . Ran Out of Food in the Last Year: Sometimes true  Transportation Needs: No Transportation Needs  . Lack of Transportation (Medical): No  . Lack of Transportation (Non-Medical): No  Physical Activity:   . Days of  Exercise per Week:   . Minutes of Exercise per Session:   Stress:   . Feeling of Stress :   Social Connections:   . Frequency of Communication with Friends and Family:   . Frequency of Social Gatherings with Friends and Family:   . Attends Religious Services:   . Active Member of Clubs or Organizations:   . Attends Banker Meetings:   Marland Kitchen Marital Status:   Intimate Partner Violence:   . Fear of Current or Ex-Partner:   . Emotionally Abused:   Marland Kitchen Physically Abused:   . Sexually Abused:     Physical Exam      Future Appointments  Date Time Provider Department Center  04/24/2020  7:45 AM [facility] DEVICE REMOTES [facility]  05/17/2020  1:40 PM Laurey Morale, MD [facility] None  07/24/2020  7:45 AM [facility] DEVICE REMOTES [facility]    BP (!) 94/0   Pulse 88   Temp (!) 96.4 F (35.8 C)   Resp 18   SpO2 98%  Weight today-348 Weight yesterday-348 Last visit weight-349 @ clinic  Pt was sitting in car when I arrived, we went upstairs, I noticed she was going slow going up the stairs. But she denied any issues going upstairs.  Still without insulin. Eileen Stanford will call her PCP to ask for a courtesy fill.  Her picc line dressing was all bunched up and her line was exposed, we did replace the tegaderm dressing using sterile method, did not replace the stat lock-only the tegaderm dressing.  She did let her nurse know though.  She reports breathing is doing ok. No c/p, no dizziness.   --needs atorvastatin, carvedilol,digoxin, metolazone, entresto  refilled meds verified and pill box reflled.  She had a Wednesday (metolazone) dose in an potassium bottle-she isnt sure where it came from. So its either todays dose-although she said she took this morning dose. Or it was last weeks dose.  She is out of the entresto but has some in there through Friday, I told her she needs to fill it from sat am dose for rest of week.   Kerry Hough,  EMT-Paramedic [phone] [facility] Paramedic  03/15/20

## 2020-03-16 ENCOUNTER — Telehealth (HOSPITAL_COMMUNITY): Payer: Self-pay | Admitting: Licensed Clinical Social Worker

## 2020-03-16 NOTE — Telephone Encounter (Signed)
Pt reached out to CSW to inform that she did not receive her SSI payment today and inquiring if she can bring her required form that they are requesting to the office so I can assist in faxing in as pt has attempted to fax in 4 times now.  Pt brought in form and CSW faxed to both numbers provided by [facility]- fax confirmations received.  CSW and pt then called [employer] and spoke to representative.  They are able to confirm they actually received the form 2 days ago but it has not been entered into the system- this is what is preventing her from getting her check and the representative states it could take another 3-5 days to process and pay patient.  CSW attempted to get a supervisor to discuss as patient needs for phone bill which is already overdue and for rent which is due on 7/10 but they were unable to get to supervisor.  Had rep sent them a message with the patients phone number with a request to call and discuss how to expedite.  CSW also encouraged pt to call again tomorrow to check on the status  Will continue to follow and assist as needed  Burna Sis, LCSW Clinical Social Worker [facility] Desk#: [phone] Cell#: [phone]

## 2020-03-21 ENCOUNTER — Telehealth (HOSPITAL_COMMUNITY): Payer: Self-pay | Admitting: Licensed Clinical Social Worker

## 2020-03-21 DIAGNOSIS — F17210 Nicotine dependence, cigarettes, uncomplicated: Secondary | ICD-10-CM | POA: Diagnosis not present

## 2020-03-21 DIAGNOSIS — Z452 Encounter for adjustment and management of vascular access device: Secondary | ICD-10-CM | POA: Diagnosis not present

## 2020-03-21 DIAGNOSIS — Z7984 Long term (current) use of oral hypoglycemic drugs: Secondary | ICD-10-CM | POA: Diagnosis not present

## 2020-03-21 DIAGNOSIS — Z79899 Other long term (current) drug therapy: Secondary | ICD-10-CM | POA: Diagnosis not present

## 2020-03-21 DIAGNOSIS — I5023 Acute on chronic systolic (congestive) heart failure: Secondary | ICD-10-CM | POA: Diagnosis not present

## 2020-03-21 DIAGNOSIS — J45909 Unspecified asthma, uncomplicated: Secondary | ICD-10-CM | POA: Diagnosis not present

## 2020-03-21 DIAGNOSIS — E119 Type 2 diabetes mellitus without complications: Secondary | ICD-10-CM | POA: Diagnosis not present

## 2020-03-21 DIAGNOSIS — Z5181 Encounter for therapeutic drug level monitoring: Secondary | ICD-10-CM | POA: Diagnosis not present

## 2020-03-21 DIAGNOSIS — Z6841 Body Mass Index (BMI) 40.0 and over, adult: Secondary | ICD-10-CM | POA: Diagnosis not present

## 2020-03-21 DIAGNOSIS — I340 Nonrheumatic mitral (valve) insufficiency: Secondary | ICD-10-CM | POA: Diagnosis not present

## 2020-03-21 DIAGNOSIS — O903 Peripartum cardiomyopathy: Secondary | ICD-10-CM | POA: Diagnosis not present

## 2020-03-21 NOTE — Telephone Encounter (Signed)
CSW informed by pt this morning that [facility] RN is not coming out today to change her milrinone bag because they have not received insurance auth for further RN visits.  CSW confirmed that verbal orders were sent in by clinic staff and found paper order that physician needed to sign- CSW had it signed and faxed in.  CSW called [facility] to inform that order faxed in and ensure that there are no other barriers to pt being seen today.  Informed by representative that the issue is with Medicaid and that there fiscal calender starts over July 1st so all Medicaid patients had to be reauthorized starting in July.  Because of this the authorizations are taking longer due to volume.   They will have RN being in close contact with pt but pt might have to do her own milrinone bag changes which she has been shown how to do.   CSW updated pt regarding current status- she will call DSS to inquire about expediting the request but feels comfortable changing her own bag but will need help with dressing change.  CSW also followed up with pt regarding her disability placement- she reports that she received her income last night so she is now ok and can pay her bills.  CSW will continue to follow and assist as needed  Burna Sis, LCSW Clinical Social Worker [facility] Desk#: [phone] Cell#: [phone]

## 2020-03-23 ENCOUNTER — Other Ambulatory Visit (HOSPITAL_COMMUNITY): Payer: Self-pay

## 2020-03-23 NOTE — Progress Notes (Signed)
Paramedicine Encounter    [name], female    DOB: Jul 05, 1981, 39 y.o.   MRN: [id number]   Patient Care Team: System, Provider Not In as PCP - General Regan Lemming, MD as PCP - Electrophysiology (Cardiology) Laurey Morale, MD as PCP - Advanced Heart Failure (Cardiology)  Patient Active Problem List   Diagnosis Date Noted  . Acute on chronic systolic CHF (congestive heart failure) (HCC) 06/17/2019  . Obstructive sleep apnea 06/17/2019  . 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 Diabetes mellitus (HCC) 06/17/2019  . Amenorrhea 08/03/2018  . Congestive heart failure (HCC) 06/29/2018  . Hypotension 06/07/2018  . Rhinovirus infection 06/06/2018  . Asthma 06/05/2018  . Acute respiratory failure with hypoxia (HCC) 06/05/2018  . Hypokalemia 06/05/2018  . Snoring 09/23/2016  . Cough 04/10/2016  . 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 Tobacco abuse 01/25/2016  . Acute on chronic combined systolic and diastolic CHF (congestive heart failure) (HCC) 01/25/2016  . Mitral regurgitation   . Abnormal EKG-inf TWI 01/15/2016  . Positive D dimer-CTA neg for PE 01/15/2016  . Morbid obesity- BMI 57 01/14/2016  . Abdominal pain 01/14/2016  . 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 At risk for sleep apnea 01/14/2016  . Cardiomyopathy (HCC) 11/01/2015  . Morbid obesity with BMI of 50.0-59.9, adult (HCC) 08/16/2015    Current Outpatient Medications:  .  albuterol (PROAIR HFA) 108 (90 Base) MCG/ACT inhaler, Inhale 2 puffs into the lungs every 6 (six) hours as needed for wheezing or shortness of breath. , Disp: , Rfl:  .  atorvastatin (LIPITOR) 10 MG tablet, Take 1 tablet (10 mg total) by mouth daily. (Patient taking differently: Take 10 mg by mouth daily. ), Disp: 90 tablet, Rfl: 3 .  carvedilol (COREG) 12.5 MG tablet, Take 1 tablet (12.5 mg total) by mouth 2 (two) times daily. Dose increase, Disp: 180 tablet, Rfl: 2 .  digoxin  (LANOXIN) 0.125 MG tablet, Take 1 tablet (0.125 mg total) by mouth daily. (Patient taking differently: Take 0.125 mg by mouth daily. ), Disp: 90 tablet, Rfl: 3 .  ivabradine (CORLANOR) 7.5 MG TABS tablet, Take 1 tablet (7.5 mg total) by mouth 2 (two) times daily with a meal., Disp: 60 tablet, Rfl: 3 .  Magnesium Oxide 400 (240 Mg) MG TABS, Take 2 tablets by mouth every morning and 1 tablet by mouth every evening, Disp: 90 tablet, Rfl: 6 .  metolazone (ZAROXOLYN) 2.5 MG tablet, Take only as directed by the CHF clinic (Patient taking differently: 2.5 mg once a week. Take only as directed by the CHF clinic), Disp: 10 tablet, Rfl: 3 .  milrinone (PRIMACOR) 20 MG/100 ML SOLN infusion, Inject 0.0414 mg/min into the vein continuous., Disp:  , Rfl:  .  potassium chloride (KLOR-CON) 10 MEQ tablet, Take 80 meq (8 tabs) in the morning, 60 meq (6 tabs) in the afternoon, and 60 meq (6 tabs) in the evening (Patient taking differently: Take 80 meq (8 tabs) in the morning, 60 meq (6 tabs) in the afternoon, and 60 meq (6 tabs) in the evening), Disp: 600 tablet, Rfl: 5 .  sacubitril-valsartan (ENTRESTO) 24-26 MG, Take 1 tablet by mouth 2 (two) times daily., Disp: 60 tablet, Rfl: 5 .  spironolactone (ALDACTONE) 25 MG tablet, Take 1 tablet (25 mg total) by mouth daily. (Patient taking differently: Take 25 mg by mouth daily. ), Disp: 90 tablet, Rfl: 3 .  torsemide (DEMADEX) 20 MG tablet, Take 5 tablets (100 mg total) by mouth in  the morning AND 4 tablets (80 mg total) every evening., Disp: 720 tablet, Rfl: 3 .  BYDUREON 2 MG PEN, Inject 2 mg into the skin once a week.  (Patient not taking: Reported on 03/23/2020), Disp: , Rfl:  .  Exenatide ER (BYDUREON) 2 MG PEN, Inject into the skin. (Patient not taking: Reported on 03/23/2020), Disp: , Rfl:  .  ipratropium-albuterol (DUONEB) 0.5-2.5 (3) MG/3ML SOLN, Take 3 mLs by nebulization every 4 (four) hours as needed. (Patient not taking: Reported on 03/15/2020), Disp: 360 mL, Rfl: 0 .   MAGNESIUM-OXIDE 400 (241.3 Mg) MG tablet, Take 1 tablet by mouth 2 (two) times daily.  (Patient not taking: Reported on 03/15/2020), Disp: , Rfl:  .  milrinone (PRIMACOR) 20 MG/100 ML SOLN infusion, Inject into the vein.  (Patient not taking: Reported on 03/15/2020), Disp: , Rfl:  .  ondansetron (ZOFRAN) 4 MG tablet, Take 1 tablet (4 mg total) by mouth every 8 (eight) hours as needed for nausea or vomiting. (Patient not taking: Reported on 03/15/2020), Disp: 20 tablet, Rfl: 0 .  potassium chloride (KLOR-CON) 20 MEQ packet, Take by mouth.  (Patient not taking: Reported on 03/15/2020), Disp: , Rfl:  .  PRESCRIPTION MEDICATION, CPAP- AT BEDTIME  (Patient not taking: Reported on 03/15/2020), Disp: , Rfl:  Allergies  Allergen Reactions  . Metformin And Related Diarrhea    And caused severe "dry mouth," also      Social History   Socioeconomic History  . Marital status: Single    Spouse name: Not on file  . Number of children: 2  . Years of education: Not on file  . Highest education level: Not on file  Occupational History  . Not on file  Tobacco Use  . Smoking status: Former Smoker    Quit date: 10/19/2015    Years since quitting: 4.4  . Smokeless tobacco: Never Used  Vaping Use  . Vaping Use: Never assessed  Substance and Sexual Activity  . Alcohol use: Yes    Alcohol/week: 0.0 standard drinks    Comment: occasionally  . Drug use: Yes    Types: Marijuana    Comment: only socially  . Sexual activity: Yes  Other Topics Concern  . Not on file  Social History Narrative  . Not on file   Social Determinants of Health   Financial Resource Strain: High Risk  . Difficulty of Paying Living Expenses: Hard  Food Insecurity: Food Insecurity Present  . Worried About Programme researcher, broadcasting/film/video in the Last Year: Sometimes true  . Ran Out of Food in the Last Year: Sometimes true  Transportation Needs: No Transportation Needs  . Lack of Transportation (Medical): No  . Lack of Transportation  (Non-Medical): No  Physical Activity:   . Days of Exercise per Week:   . Minutes of Exercise per Session:   Stress:   . Feeling of Stress :   Social Connections:   . Frequency of Communication with Friends and Family:   . Frequency of Social Gatherings with Friends and Family:   . Attends Religious Services:   . Active Member of Clubs or Organizations:   . Attends Banker Meetings:   Marland Kitchen Marital Status:   Intimate Partner Violence:   . Fear of Current or Ex-Partner:   . Emotionally Abused:   Marland Kitchen Physically Abused:   . Sexually Abused:     Physical Exam      Future Appointments  Date Time Provider Department Center  04/24/2020  7:45  AM [facility]  05/17/2020  1:40 PM Laurey Morale, MD [facility] None  07/24/2020  7:45 AM [facility]    BP (!) 106/0   Pulse 90   Temp (!) 97 F (36.1 C)   Resp 18   SpO2 98%  [facility] EMS-185 Weight yesterday-349 Last visit weight-348  Pt reports she is doing ok. She had leg cramps bad this morning and last night. She reports taking all her meds. They have subsided now but now her legs are sore from the cramping.  She states that she got a [employer] membership and has went once and she liked it. She states tomor is her birthday and she plans to go to a hotel tomor and get a massage and go to [employer] to get wings and nachos. I strongly urged her to not do that and start retracting her progress she has made thus far. She is not using her CPAP. I advised her to begin using it and to have a chance at more therapies in the future she has to be compliant with all that is being asked of her like her diet control, exercise, CPAP use and medications. She denies missing any meds this past week. I couldn't see her yesterday due to her having an event she had to go to so she asked I come today. I have asked her to go to [employer] at least 3X a week.   Eileen Stanford has been  unable to reach her PCP to ask for a courtesy fill so she is still out of her bydureon.   Kerry Hough, EMT-Paramedic [phone] [facility] Paramedic  03/23/20

## 2020-03-28 ENCOUNTER — Other Ambulatory Visit (HOSPITAL_COMMUNITY): Payer: Self-pay | Admitting: Cardiology

## 2020-03-28 DIAGNOSIS — I340 Nonrheumatic mitral (valve) insufficiency: Secondary | ICD-10-CM | POA: Diagnosis not present

## 2020-03-28 DIAGNOSIS — Z5181 Encounter for therapeutic drug level monitoring: Secondary | ICD-10-CM | POA: Diagnosis not present

## 2020-03-28 DIAGNOSIS — Z6841 Body Mass Index (BMI) 40.0 and over, adult: Secondary | ICD-10-CM | POA: Diagnosis not present

## 2020-03-28 DIAGNOSIS — E119 Type 2 diabetes mellitus without complications: Secondary | ICD-10-CM | POA: Diagnosis not present

## 2020-03-28 DIAGNOSIS — Z79899 Other long term (current) drug therapy: Secondary | ICD-10-CM | POA: Diagnosis not present

## 2020-03-28 DIAGNOSIS — O903 Peripartum cardiomyopathy: Secondary | ICD-10-CM | POA: Diagnosis not present

## 2020-03-28 DIAGNOSIS — J45909 Unspecified asthma, uncomplicated: Secondary | ICD-10-CM | POA: Diagnosis not present

## 2020-03-28 DIAGNOSIS — Z7984 Long term (current) use of oral hypoglycemic drugs: Secondary | ICD-10-CM | POA: Diagnosis not present

## 2020-03-28 DIAGNOSIS — I5023 Acute on chronic systolic (congestive) heart failure: Secondary | ICD-10-CM | POA: Diagnosis not present

## 2020-03-28 DIAGNOSIS — F17210 Nicotine dependence, cigarettes, uncomplicated: Secondary | ICD-10-CM | POA: Diagnosis not present

## 2020-03-28 DIAGNOSIS — Z452 Encounter for adjustment and management of vascular access device: Secondary | ICD-10-CM | POA: Diagnosis not present

## 2020-03-30 ENCOUNTER — Other Ambulatory Visit (HOSPITAL_COMMUNITY): Payer: Self-pay

## 2020-03-30 NOTE — Progress Notes (Signed)
When I arrived at pts home for our scheduled visit, I noticed her Kirsten Wheeler was not there. I sent her message asking if she was home-she replied she was not home. Was down in [place] to court for child support and she isnt sure when she will be back.  Advised her it would be next week when I can follow up due to very busy schedule today.   Kerry Hough, EMT-Paramedic  03/30/20

## 2020-04-04 DIAGNOSIS — Z5181 Encounter for therapeutic drug level monitoring: Secondary | ICD-10-CM | POA: Diagnosis not present

## 2020-04-04 DIAGNOSIS — F17210 Nicotine dependence, cigarettes, uncomplicated: Secondary | ICD-10-CM | POA: Diagnosis not present

## 2020-04-04 DIAGNOSIS — Z79899 Other long term (current) drug therapy: Secondary | ICD-10-CM | POA: Diagnosis not present

## 2020-04-04 DIAGNOSIS — Z452 Encounter for adjustment and management of vascular access device: Secondary | ICD-10-CM | POA: Diagnosis not present

## 2020-04-04 DIAGNOSIS — E119 Type 2 diabetes mellitus without complications: Secondary | ICD-10-CM | POA: Diagnosis not present

## 2020-04-04 DIAGNOSIS — I5023 Acute on chronic systolic (congestive) heart failure: Secondary | ICD-10-CM | POA: Diagnosis not present

## 2020-04-04 DIAGNOSIS — Z6841 Body Mass Index (BMI) 40.0 and over, adult: Secondary | ICD-10-CM | POA: Diagnosis not present

## 2020-04-04 DIAGNOSIS — Z7984 Long term (current) use of oral hypoglycemic drugs: Secondary | ICD-10-CM | POA: Diagnosis not present

## 2020-04-04 DIAGNOSIS — O903 Peripartum cardiomyopathy: Secondary | ICD-10-CM | POA: Diagnosis not present

## 2020-04-04 DIAGNOSIS — I340 Nonrheumatic mitral (valve) insufficiency: Secondary | ICD-10-CM | POA: Diagnosis not present

## 2020-04-04 DIAGNOSIS — J45909 Unspecified asthma, uncomplicated: Secondary | ICD-10-CM | POA: Diagnosis not present

## 2020-04-05 ENCOUNTER — Other Ambulatory Visit (HOSPITAL_COMMUNITY): Payer: Self-pay

## 2020-04-05 NOTE — Progress Notes (Signed)
Paramedicine Encounter    [name], female    DOB: 07-16-1981, 39 y.o.   MRN: [id number]   Patient Care Team: System, Provider Not In as PCP - General Regan Lemming, MD as PCP - Electrophysiology (Cardiology) Laurey Morale, MD as PCP - Advanced Heart Failure (Cardiology)  Patient Active Problem List   Diagnosis Date Noted  . Acute on chronic systolic CHF (congestive heart failure) (HCC) 06/17/2019  . Obstructive sleep apnea 06/17/2019  . 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 Diabetes mellitus (HCC) 06/17/2019  . Amenorrhea 08/03/2018  . Congestive heart failure (HCC) 06/29/2018  . Hypotension 06/07/2018  . Rhinovirus infection 06/06/2018  . Asthma 06/05/2018  . Acute respiratory failure with hypoxia (HCC) 06/05/2018  . Hypokalemia 06/05/2018  . Snoring 09/23/2016  . Cough 04/10/2016  . 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 Tobacco abuse 01/25/2016  . Acute on chronic combined systolic and diastolic CHF (congestive heart failure) (HCC) 01/25/2016  . Mitral regurgitation   . Abnormal EKG-inf TWI 01/15/2016  . Positive D dimer-CTA neg for PE 01/15/2016  . Morbid obesity- BMI 57 01/14/2016  . Abdominal pain 01/14/2016  . 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 At risk for sleep apnea 01/14/2016  . Cardiomyopathy (HCC) 11/01/2015  . Morbid obesity with BMI of 50.0-59.9, adult (HCC) 08/16/2015    Current Outpatient Medications:  .  atorvastatin (LIPITOR) 10 MG tablet, Take 1 tablet (10 mg total) by mouth daily. (Patient taking differently: Take 10 mg by mouth daily. ), Disp: 90 tablet, Rfl: 3 .  carvedilol (COREG) 12.5 MG tablet, Take 1 tablet (12.5 mg total) by mouth 2 (two) times daily. Dose increase, Disp: 180 tablet, Rfl: 2 .  digoxin (LANOXIN) 0.125 MG tablet, Take 1 tablet (0.125 mg total) by mouth daily. (Patient taking differently: Take 0.125 mg by mouth daily. ), Disp: 90 tablet, Rfl: 3 .  ivabradine  (CORLANOR) 7.5 MG TABS tablet, Take 1 tablet (7.5 mg total) by mouth 2 (two) times daily with a meal., Disp: 60 tablet, Rfl: 3 .  Magnesium Oxide 400 (240 Mg) MG TABS, Take 2 tablets by mouth every morning and 1 tablet by mouth every evening, Disp: 90 tablet, Rfl: 6 .  metolazone (ZAROXOLYN) 2.5 MG tablet, Take only as directed by the CHF clinic (Patient taking differently: 2.5 mg once a week. Take only as directed by the CHF clinic), Disp: 10 tablet, Rfl: 3 .  milrinone (PRIMACOR) 20 MG/100 ML SOLN infusion, Inject 0.0414 mg/min into the vein continuous., Disp:  , Rfl:  .  potassium chloride (KLOR-CON) 10 MEQ tablet, Take 80 meq (8 tabs) in the morning, 60 meq (6 tabs) in the afternoon, and 60 meq (6 tabs) in the evening (Patient taking differently: Take 80 meq (8 tabs) in the morning, 60 meq (6 tabs) in the afternoon, and 60 meq (6 tabs) in the evening), Disp: 600 tablet, Rfl: 5 .  PRESCRIPTION MEDICATION, CPAP- AT BEDTIME, Disp: , Rfl:  .  sacubitril-valsartan (ENTRESTO) 24-26 MG, Take 1 tablet by mouth 2 (two) times daily., Disp: 60 tablet, Rfl: 5 .  spironolactone (ALDACTONE) 25 MG tablet, Take 1 tablet (25 mg total) by mouth daily. (Patient taking differently: Take 25 mg by mouth daily. ), Disp: 90 tablet, Rfl: 3 .  torsemide (DEMADEX) 20 MG tablet, Take 5 tablets (100 mg total) by mouth in the morning AND 4 tablets (80 mg total) every evening., Disp: 720 tablet, Rfl: 3 .  albuterol (PROAIR HFA) 108 (90  Base) MCG/ACT inhaler, Inhale 2 puffs into the lungs every 6 (six) hours as needed for wheezing or shortness of breath.  (Patient not taking: Reported on 04/05/2020), Disp: , Rfl:  .  BYDUREON 2 MG PEN, Inject 2 mg into the skin once a week.  (Patient not taking: Reported on 03/23/2020), Disp: , Rfl:  .  Exenatide ER (BYDUREON) 2 MG PEN, Inject into the skin. (Patient not taking: Reported on 03/23/2020), Disp: , Rfl:  .  ipratropium-albuterol (DUONEB) 0.5-2.5 (3) MG/3ML SOLN, Take 3 mLs by nebulization  every 4 (four) hours as needed. (Patient not taking: Reported on 03/15/2020), Disp: 360 mL, Rfl: 0 .  MAGNESIUM-OXIDE 400 (241.3 Mg) MG tablet, Take 1 tablet by mouth 2 (two) times daily.  (Patient not taking: Reported on 03/15/2020), Disp: , Rfl:  .  milrinone (PRIMACOR) 20 MG/100 ML SOLN infusion, Inject into the vein.  (Patient not taking: Reported on 03/15/2020), Disp: , Rfl:  .  ondansetron (ZOFRAN) 4 MG tablet, Take 1 tablet (4 mg total) by mouth every 8 (eight) hours as needed for nausea or vomiting. (Patient not taking: Reported on 03/15/2020), Disp: 20 tablet, Rfl: 0 .  potassium chloride (KLOR-CON) 20 MEQ packet, Take by mouth.  (Patient not taking: Reported on 03/15/2020), Disp: , Rfl:  Allergies  Allergen Reactions  . Metformin And Related Diarrhea    And caused severe "dry mouth," also      Social History   Socioeconomic History  . Marital status: Single    Spouse name: Not on file  . Number of children: 2  . Years of education: Not on file  . Highest education level: Not on file  Occupational History  . Not on file  Tobacco Use  . Smoking status: Former Smoker    Quit date: 10/19/2015    Years since quitting: 4.4  . Smokeless tobacco: Never Used  Vaping Use  . Vaping Use: Never assessed  Substance and Sexual Activity  . Alcohol use: Yes    Alcohol/week: 0.0 standard drinks    Comment: occasionally  . Drug use: Yes    Types: Marijuana    Comment: only socially  . Sexual activity: Yes  Other Topics Concern  . Not on file  Social History Narrative  . Not on file   Social Determinants of Health   Financial Resource Strain: High Risk  . Difficulty of Paying Living Expenses: Hard  Food Insecurity: Food Insecurity Present  . Worried About Programme researcher, broadcasting/film/video in the Last Year: Sometimes true  . Ran Out of Food in the Last Year: Sometimes true  Transportation Needs: No Transportation Needs  . Lack of Transportation (Medical): No  . Lack of Transportation  (Non-Medical): No  Physical Activity:   . Days of Exercise per Week:   . Minutes of Exercise per Session:   Stress:   . Feeling of Stress :   Social Connections:   . Frequency of Communication with Friends and Family:   . Frequency of Social Gatherings with Friends and Family:   . Attends Religious Services:   . Active Member of Clubs or Organizations:   . Attends Banker Meetings:   Marland Kitchen Marital Status:   Intimate Partner Violence:   . Fear of Current or Ex-Partner:   . Emotionally Abused:   Marland Kitchen Physically Abused:   . Sexually Abused:     Physical Exam      Future Appointments  Date Time Provider Department Center  04/24/2020  7:45 AM  [facility]  05/17/2020  1:40 PM Laurey Morale, MD [facility] None  07/24/2020  7:45 AM [facility]    BP (!) 94/0   Pulse 86   Temp 97.6 F (36.4 C)   Resp 18   SpO2 98%  Weight today-349 Weight yesterday-350 Last visit weight-349  Pt reports that she is doing alright. She didn't feel good yesterday. She slept a lot yesterday afternoon which isnt like her.  She is using her CPAP-she has used it every night, getting up to 4hrs with each use.  She has not been back to gym. Pt is still out of her bydureon.  She denies missing any of her meds.  meds verified and pill box refilled.  I will be gone next week but she said she could manage her meds. She knows to contact clinic or jenna for any issues. Nurse came out yesterday for milrinone exchange.  She is not going to be going to new PCP due to her missing her childrens initial appointment there and she was told that she couldn't resch her kids appointments so she decided to not go for her own.  She states she got the trulicity delivered--instead of the bydureon. Looks like a dr Dareen Piano sent it over on 03/29/20. Advised her to get an appoint back with her then so she wont have any trouble getting her refills  when we need them.   --need corlanor, potassium and spiro for refill- those were ordered.    Kerry Hough, EMT-Paramedic [phone] [facility] Paramedic  04/05/20

## 2020-04-07 ENCOUNTER — Telehealth (HOSPITAL_COMMUNITY): Payer: Self-pay | Admitting: Licensed Clinical Social Worker

## 2020-04-07 NOTE — Telephone Encounter (Signed)
CSW met with pt to walk and discuss her current progress with her health.  Completed about 1.5 miles during walk with no breaks.  Pt reports she is still not going to the gym and states that even though she needs to do so she has trouble making her body get up and do.  CSW discussed setting smaller goals for herself and instead of thinking she has to go work out for an hour or two just tell her self she has to do 5 minutes and if she feels like she can do more at the end of that time then she should keep going.   Pt wishes she had someone to keep her accountable to her goals- someone who could meet her at the gym and make sure she is doing what she is supposed to but understands she needs to be accountable to herself.  Pt will plan to go to the gym at 6pm after she drops her daughter off at cheer practice.  Will try to complete 1 hours of exercise and then reward herself by relaxing in the massage chairs they have.    No further needs or concerns expressed this time  Jorge Ny, [facility] Desk#: [phone] Cell#: [phone]

## 2020-04-11 DIAGNOSIS — O903 Peripartum cardiomyopathy: Secondary | ICD-10-CM | POA: Diagnosis not present

## 2020-04-11 DIAGNOSIS — J45909 Unspecified asthma, uncomplicated: Secondary | ICD-10-CM | POA: Diagnosis not present

## 2020-04-11 DIAGNOSIS — Z5181 Encounter for therapeutic drug level monitoring: Secondary | ICD-10-CM | POA: Diagnosis not present

## 2020-04-11 DIAGNOSIS — Z6841 Body Mass Index (BMI) 40.0 and over, adult: Secondary | ICD-10-CM | POA: Diagnosis not present

## 2020-04-11 DIAGNOSIS — F17210 Nicotine dependence, cigarettes, uncomplicated: Secondary | ICD-10-CM | POA: Diagnosis not present

## 2020-04-11 DIAGNOSIS — Z7984 Long term (current) use of oral hypoglycemic drugs: Secondary | ICD-10-CM | POA: Diagnosis not present

## 2020-04-11 DIAGNOSIS — E119 Type 2 diabetes mellitus without complications: Secondary | ICD-10-CM | POA: Diagnosis not present

## 2020-04-11 DIAGNOSIS — I5023 Acute on chronic systolic (congestive) heart failure: Secondary | ICD-10-CM | POA: Diagnosis not present

## 2020-04-11 DIAGNOSIS — Z79899 Other long term (current) drug therapy: Secondary | ICD-10-CM | POA: Diagnosis not present

## 2020-04-11 DIAGNOSIS — Z452 Encounter for adjustment and management of vascular access device: Secondary | ICD-10-CM | POA: Diagnosis not present

## 2020-04-11 DIAGNOSIS — I340 Nonrheumatic mitral (valve) insufficiency: Secondary | ICD-10-CM | POA: Diagnosis not present

## 2020-04-12 ENCOUNTER — Other Ambulatory Visit (HOSPITAL_COMMUNITY): Payer: Self-pay | Admitting: Cardiology

## 2020-04-17 ENCOUNTER — Telehealth (HOSPITAL_COMMUNITY): Payer: Self-pay | Admitting: Licensed Clinical Social Worker

## 2020-04-17 NOTE — Telephone Encounter (Signed)
Pt reached out to CSW to request assistance with getting a letter for her apartment complex to allow her sister to stay with her longer term to help out with things at home.  CSW completed letter explaining pt diagnosis and decreased ability complete household tasks and pt coming to pick up  Will continue to follow and assist as needed  Burna Sis, LCSW Clinical Social Worker [facility] Desk#: [phone] Cell#: [phone]

## 2020-04-18 DIAGNOSIS — I5023 Acute on chronic systolic (congestive) heart failure: Secondary | ICD-10-CM | POA: Diagnosis not present

## 2020-04-18 DIAGNOSIS — Z452 Encounter for adjustment and management of vascular access device: Secondary | ICD-10-CM | POA: Diagnosis not present

## 2020-04-18 DIAGNOSIS — Z5181 Encounter for therapeutic drug level monitoring: Secondary | ICD-10-CM | POA: Diagnosis not present

## 2020-04-18 DIAGNOSIS — I340 Nonrheumatic mitral (valve) insufficiency: Secondary | ICD-10-CM | POA: Diagnosis not present

## 2020-04-18 DIAGNOSIS — Z7984 Long term (current) use of oral hypoglycemic drugs: Secondary | ICD-10-CM | POA: Diagnosis not present

## 2020-04-18 DIAGNOSIS — F17210 Nicotine dependence, cigarettes, uncomplicated: Secondary | ICD-10-CM | POA: Diagnosis not present

## 2020-04-18 DIAGNOSIS — Z79899 Other long term (current) drug therapy: Secondary | ICD-10-CM | POA: Diagnosis not present

## 2020-04-18 DIAGNOSIS — O903 Peripartum cardiomyopathy: Secondary | ICD-10-CM | POA: Diagnosis not present

## 2020-04-18 DIAGNOSIS — J45909 Unspecified asthma, uncomplicated: Secondary | ICD-10-CM | POA: Diagnosis not present

## 2020-04-18 DIAGNOSIS — Z6841 Body Mass Index (BMI) 40.0 and over, adult: Secondary | ICD-10-CM | POA: Diagnosis not present

## 2020-04-18 DIAGNOSIS — E119 Type 2 diabetes mellitus without complications: Secondary | ICD-10-CM | POA: Diagnosis not present

## 2020-04-19 ENCOUNTER — Other Ambulatory Visit (HOSPITAL_COMMUNITY): Payer: Self-pay

## 2020-04-19 NOTE — Progress Notes (Signed)
Paramedicine Encounter    [name], female    DOB: [birth year], 39 y.o.   MRN: [id number]   Patient Care Team: System, Provider Not In as PCP - General Regan Lemming, MD as PCP - Electrophysiology (Cardiology) Laurey Morale, MD as PCP - Advanced Heart Failure (Cardiology)  Patient Active Problem List   Diagnosis Date Noted   Acute on chronic systolic CHF (congestive heart failure) (HCC) 06/17/2019   Obstructive sleep apnea 06/17/2019   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   Diabetes mellitus (HCC) 06/17/2019   Amenorrhea 08/03/2018   Congestive heart failure (HCC) 06/29/2018   Hypotension 06/07/2018   Rhinovirus infection 06/06/2018   Asthma 06/05/2018   Acute respiratory failure with hypoxia (HCC) 06/05/2018   Hypokalemia 06/05/2018   Snoring 09/23/2016   Cough 04/10/2016   NICM (nonischemic cardiomyopathy) (HCC) 01/25/2016   Tobacco abuse 01/25/2016   Acute on chronic combined systolic and diastolic CHF (congestive heart failure) (HCC) 01/25/2016   Mitral regurgitation    Abnormal EKG-inf TWI 01/15/2016   Positive D dimer-CTA neg for PE 01/15/2016   Morbid obesity- BMI 57 01/14/2016   Abdominal pain 01/14/2016   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 (Patient taking differently: Take 10 mg by mouth daily. ), Disp: 90 tablet, Rfl: 3   carvedilol (COREG) 12.5 MG tablet, Take 1 tablet (12.5 mg total) by mouth 2 (two) times daily. Dose increase, Disp: 180 tablet, Rfl: 2   digoxin (LANOXIN) 0.125 MG tablet, Take 1 tablet (0.125 mg total) by mouth daily. (Patient taking differently: Take 0.125 mg by mouth daily. ), Disp: 90 tablet, Rfl: 3   ivabradine  (CORLANOR) 7.5 MG TABS tablet, Take 1 tablet (7.5 mg total) by mouth 2 (two) times daily with a meal., Disp: 60 tablet, Rfl: 3   Magnesium Oxide 400 (240 Mg) MG TABS, Take 2 tablets by mouth every morning and 1 tablet by mouth every evening, Disp: 90 tablet, Rfl: 6   metolazone (ZAROXOLYN) 2.5 MG tablet, Take only as directed by the CHF clinic (Patient taking differently: 2.5 mg once a week. Take only as directed by the CHF clinic), Disp: 10 tablet, Rfl: 3   milrinone (PRIMACOR) 20 MG/100 ML SOLN infusion, Inject 0.0414 mg/min into the vein continuous., Disp:  , Rfl:    potassium chloride (KLOR-CON) 10 MEQ tablet, Take 80 meq (8 tabs) in the morning, 60 meq (6 tabs) in the afternoon, and 60 meq (6 tabs) in the evening (Patient taking differently: Take 80 meq (8 tabs) in the morning, 60 meq (6 tabs) in the afternoon, and 60 meq (6 tabs) in the evening), Disp: 600 tablet, Rfl: 5   PRESCRIPTION MEDICATION, CPAP- AT BEDTIME, Disp: , Rfl:    sacubitril-valsartan (ENTRESTO) 24-26 MG, Take 1 tablet by mouth 2 (two) times daily., Disp: 60 tablet, Rfl: 5   spironolactone (ALDACTONE) 25 MG tablet, Take 1 tablet (25 mg total) by mouth daily. (Patient taking differently: Take 25 mg by mouth daily. ), Disp: 90 tablet, Rfl: 3   torsemide (DEMADEX) 20 MG tablet, Take 5 tablets (100 mg total) by mouth in the morning AND 4 tablets (80 mg total) every evening., Disp: 720 tablet, Rfl: 3   albuterol (PROAIR HFA) 108 (90  Base) MCG/ACT inhaler, Inhale 2 puffs into the lungs every 6 (six) hours as needed for wheezing or shortness of breath.  (Patient not taking: Reported on 04/05/2020), Disp: , Rfl:    BYDUREON 2 MG PEN, Inject 2 mg into the skin once a week.  (Patient not taking: Reported on 03/23/2020), Disp: , Rfl:    Exenatide ER (BYDUREON) 2 MG PEN, Inject into the skin. (Patient not taking: Reported on 03/23/2020), Disp: , Rfl:    ipratropium-albuterol (DUONEB) 0.5-2.5 (3) MG/3ML SOLN, Take 3 mLs by nebulization  every 4 (four) hours as needed. (Patient not taking: Reported on 03/15/2020), Disp: 360 mL, Rfl: 0   MAGNESIUM-OXIDE 400 (241.3 Mg) MG tablet, Take 1 tablet by mouth 2 (two) times daily.  (Patient not taking: Reported on 03/15/2020), Disp: , Rfl:    milrinone (PRIMACOR) 20 MG/100 ML SOLN infusion, Inject into the vein.  (Patient not taking: Reported on 03/15/2020), Disp: , Rfl:    ondansetron (ZOFRAN) 4 MG tablet, Take 1 tablet (4 mg total) by mouth every 8 (eight) hours as needed for nausea or vomiting. (Patient not taking: Reported on 03/15/2020), Disp: 20 tablet, Rfl: 0   potassium chloride (KLOR-CON) 20 MEQ packet, Take by mouth.  (Patient not taking: Reported on 03/15/2020), Disp: , Rfl:  Allergies  Allergen Reactions   Metformin And Related Diarrhea    And caused severe "dry mouth," also      Social History   Socioeconomic History   Marital status: Single    Spouse name: Not on file   Number of children: 2   Years of education: Not on file   Highest education level: Not on file  Occupational History   Not on file  Tobacco Use   Smoking status: Former Smoker    Quit date: 10/19/2015    Years since quitting: 4.5   Smokeless tobacco: Never Used  Vaping Use   Vaping Use: Never assessed  Substance and Sexual Activity   Alcohol use: Yes    Alcohol/week: 0.0 standard drinks    Comment: occasionally   Drug use: Yes    Types: Marijuana    Comment: only socially   Sexual activity: Yes  Other Topics Concern   Not on file  Social History Narrative   Not on file   Social Determinants of Health   Financial Resource Strain: High Risk   Difficulty of Paying Living Expenses: Hard  Food Insecurity: Food Insecurity Present   Worried About Running Out of Food in the Last Year: Sometimes true   Ran Out of Food in the Last Year: Sometimes true  Transportation Needs: No Transportation Needs   Lack of Transportation (Medical): No   Lack of Transportation  (Non-Medical): No  Physical Activity:    Days of Exercise per Week:    Minutes of Exercise per Session:   Stress:    Feeling of Stress :   Social Connections:    Frequency of Communication with Friends and Family:    Frequency of Social Gatherings with Friends and Family:    Attends Religious Services:    Active Member of Clubs or Organizations:    Attends Banker Meetings:    Marital Status:   Intimate Partner Violence:    Fear of Current or Ex-Partner:    Emotionally Abused:    Physically Abused:    Sexually Abused:     Physical Exam      Future Appointments  Date Time Provider Department Center  04/24/2020  7:45 AM  [facility] DEVICE REMOTES [facility]  05/17/2020  1:40 PM Laurey Morale, MD [facility] None  07/24/2020  7:45 AM [facility] DEVICE REMOTES [facility]    BP (!) 116/0    Pulse 98    Temp (!) 97.2 F (36.2 C)    Resp 18    SpO2 98%   Weight today-didn't weigh Weight yesterday-350 Last visit weight-349  Pt reports she has been "off" lately. Her dad is in hosp in [place] sick right now and she is planning a trip there this Friday to see him. She plans to stay gone for over a week and is going to see if home health will give you an additional bag of medicine to take with her to change it out.  She has been to gym again and says she is doing better with that.  She denies sob.  No c/p, no dizziness.  Using CPAP as much as she can.  meds verified and pill box refilled.   -entresto, torsemide needs refilled I will get them called in and get [facility] to take them to her before she leaves.  She plans on taking a bus up there with her daughters.   Kerry Hough, EMT-Paramedic [phone] [facility] Paramedic  04/19/20

## 2020-04-20 ENCOUNTER — Other Ambulatory Visit (HOSPITAL_COMMUNITY): Payer: Self-pay | Admitting: Cardiology

## 2020-04-24 ENCOUNTER — Telehealth (HOSPITAL_COMMUNITY): Payer: Self-pay

## 2020-04-24 NOTE — Telephone Encounter (Signed)
Contacted pt to confirm if she made it to [place] to visit her dad this week. She did. She plans to be gone all week. I told her to contact me when she returns and we will sch a home visit.   Kerry Hough, EMT-Paramedic  04/24/20

## 2020-04-25 ENCOUNTER — Telehealth (HOSPITAL_COMMUNITY): Payer: Self-pay | Admitting: Cardiology

## 2020-04-25 NOTE — Telephone Encounter (Signed)
Kirsten Wheeler from [facility] called. Their service sees the patient weekly to check on her IV. The patient is out of state visiting an ill family member. The [facility] wanted to document that the patient was given the supplies to change everything herself and let us know she is out of state

## 2020-05-04 ENCOUNTER — Telehealth (HOSPITAL_COMMUNITY): Payer: Self-pay

## 2020-05-04 NOTE — Telephone Encounter (Signed)
Reached out to pt a couple times this week after she said she would be home, but no response.  Will f/u next week.   Kerry Hough, EMT-Paramedic  05/04/20

## 2020-05-09 DIAGNOSIS — E119 Type 2 diabetes mellitus without complications: Secondary | ICD-10-CM | POA: Diagnosis not present

## 2020-05-09 DIAGNOSIS — F17210 Nicotine dependence, cigarettes, uncomplicated: Secondary | ICD-10-CM | POA: Diagnosis not present

## 2020-05-09 DIAGNOSIS — J45909 Unspecified asthma, uncomplicated: Secondary | ICD-10-CM | POA: Diagnosis not present

## 2020-05-09 DIAGNOSIS — I5023 Acute on chronic systolic (congestive) heart failure: Secondary | ICD-10-CM | POA: Diagnosis not present

## 2020-05-09 DIAGNOSIS — I340 Nonrheumatic mitral (valve) insufficiency: Secondary | ICD-10-CM | POA: Diagnosis not present

## 2020-05-09 DIAGNOSIS — Z452 Encounter for adjustment and management of vascular access device: Secondary | ICD-10-CM | POA: Diagnosis not present

## 2020-05-09 DIAGNOSIS — Z6841 Body Mass Index (BMI) 40.0 and over, adult: Secondary | ICD-10-CM | POA: Diagnosis not present

## 2020-05-09 DIAGNOSIS — O903 Peripartum cardiomyopathy: Secondary | ICD-10-CM | POA: Diagnosis not present

## 2020-05-09 DIAGNOSIS — Z7984 Long term (current) use of oral hypoglycemic drugs: Secondary | ICD-10-CM | POA: Diagnosis not present

## 2020-05-17 ENCOUNTER — Encounter (HOSPITAL_COMMUNITY): Payer: Self-pay | Admitting: Cardiology

## 2020-05-17 ENCOUNTER — Other Ambulatory Visit: Payer: Self-pay

## 2020-05-17 ENCOUNTER — Ambulatory Visit (HOSPITAL_COMMUNITY)
Admission: RE | Admit: 2020-05-17 | Discharge: 2020-05-17 | Disposition: A | Discharge status: Home / Self Care | Payer: Medicaid Other | Source: Ambulatory Visit | Attending: Cardiology | Admitting: Cardiology

## 2020-05-17 ENCOUNTER — Other Ambulatory Visit (HOSPITAL_COMMUNITY): Payer: Self-pay

## 2020-05-17 VITALS — BP 127/92 | HR 92 | Wt 356.0 lb

## 2020-05-17 DIAGNOSIS — Z87891 Personal history of nicotine dependence: Secondary | ICD-10-CM | POA: Insufficient documentation

## 2020-05-17 DIAGNOSIS — Z452 Encounter for adjustment and management of vascular access device: Secondary | ICD-10-CM | POA: Diagnosis not present

## 2020-05-17 DIAGNOSIS — Z79899 Other long term (current) drug therapy: Secondary | ICD-10-CM | POA: Diagnosis not present

## 2020-05-17 DIAGNOSIS — Z7901 Long term (current) use of anticoagulants: Secondary | ICD-10-CM | POA: Insufficient documentation

## 2020-05-17 DIAGNOSIS — G4733 Obstructive sleep apnea (adult) (pediatric): Secondary | ICD-10-CM | POA: Insufficient documentation

## 2020-05-17 DIAGNOSIS — I428 Other cardiomyopathies: Secondary | ICD-10-CM | POA: Insufficient documentation

## 2020-05-17 DIAGNOSIS — Z8249 Family history of ischemic heart disease and other diseases of the circulatory system: Secondary | ICD-10-CM | POA: Diagnosis not present

## 2020-05-17 DIAGNOSIS — I471 Supraventricular tachycardia: Secondary | ICD-10-CM | POA: Diagnosis not present

## 2020-05-17 DIAGNOSIS — I340 Nonrheumatic mitral (valve) insufficiency: Secondary | ICD-10-CM | POA: Diagnosis not present

## 2020-05-17 DIAGNOSIS — E119 Type 2 diabetes mellitus without complications: Secondary | ICD-10-CM | POA: Diagnosis not present

## 2020-05-17 DIAGNOSIS — Z7984 Long term (current) use of oral hypoglycemic drugs: Secondary | ICD-10-CM | POA: Diagnosis not present

## 2020-05-17 DIAGNOSIS — I5023 Acute on chronic systolic (congestive) heart failure: Secondary | ICD-10-CM | POA: Diagnosis not present

## 2020-05-17 DIAGNOSIS — I5022 Chronic systolic (congestive) heart failure: Secondary | ICD-10-CM | POA: Insufficient documentation

## 2020-05-17 DIAGNOSIS — Z6841 Body Mass Index (BMI) 40.0 and over, adult: Secondary | ICD-10-CM | POA: Diagnosis not present

## 2020-05-17 DIAGNOSIS — O903 Peripartum cardiomyopathy: Secondary | ICD-10-CM | POA: Diagnosis not present

## 2020-05-17 DIAGNOSIS — J45909 Unspecified asthma, uncomplicated: Secondary | ICD-10-CM | POA: Diagnosis not present

## 2020-05-17 DIAGNOSIS — E876 Hypokalemia: Secondary | ICD-10-CM | POA: Insufficient documentation

## 2020-05-17 DIAGNOSIS — F17210 Nicotine dependence, cigarettes, uncomplicated: Secondary | ICD-10-CM | POA: Diagnosis not present

## 2020-05-17 LAB — BASIC METABOLIC PANEL
Anion gap: 14 (ref 5–15)
BUN: 10 mg/dL (ref 6–20)
CO2: 27 mmol/L (ref 22–32)
Calcium: 9.6 mg/dL (ref 8.9–10.3)
Chloride: 93 mmol/L — ABNORMAL LOW (ref 98–111)
Creatinine, Ser: 1 mg/dL (ref 0.44–1.00)
GFR calc Af Amer: >60 mL/min (ref >60)
GFR calc non Af Amer: >60 mL/min (ref >60)
Glucose, Bld: 354 mg/dL — ABNORMAL HIGH (ref 70–99)
Potassium: 2.8 mmol/L — ABNORMAL LOW (ref 3.5–5.1)
Sodium: 134 mmol/L — ABNORMAL LOW (ref 135–145)

## 2020-05-17 LAB — DIGOXIN LEVEL: Digoxin Level: <0.2 ng/mL — ABNORMAL LOW (ref 0.8–2.0)

## 2020-05-17 MED ORDER — ENTRESTO 49-51 MG PO TABS: 1.0000 | ORAL_TABLET | Freq: Two times a day (BID) | ORAL | 3 refills | Status: DC

## 2020-05-17 NOTE — Patient Instructions (Addendum)
Increase Entresto to 49/51 mg Twice daily   Labs done today, your results will be available in MyChart, we will contact you for abnormal readings.  Your physician recommends that you return for lab work in: 10 days  Dr Shirlee Latch has recommended you go to the Bariatric Weight loss clinic at [facility], you have to call and register for a webinar as the first step, please call them at [phone]  Please follow up with our heart failure pharmacist in 3-4 weeks  Your physician recommends that you schedule a follow-up appointment in: 6-8 weeks  If you have any questions or concerns before your next appointment please send [place] a message through [place] or call our office at [phone].    TO LEAVE A MESSAGE FOR THE NURSE SELECT OPTION 2, PLEASE LEAVE A MESSAGE INCLUDING: . YOUR NAME . DATE OF BIRTH . CALL BACK NUMBER . REASON FOR CALL**this is important as we prioritize the call backs  YOU WILL RECEIVE A CALL BACK THE SAME DAY AS LONG AS YOU CALL BEFORE 4:00 PM  At the Advanced Heart Failure Clinic, you and your health needs are our priority. As part of our continuing mission to provide you with exceptional heart care, we have created designated Provider Care Teams. These Care Teams include your primary Cardiologist (physician) and Advanced Practice Providers (APPs- Physician Assistants and Nurse Practitioners) who all work together to provide you with the care you need, when you need it.   You may see any of the following providers on your designated Care Team at your next follow up: Marland Kitchen Dr Arvilla Meres . Dr Marca Ancona . Tonye Becket, NP . Robbie Lis, PA . Karle Plumber, PharmD   Please be sure to bring in all your medications bottles to every appointment.

## 2020-05-17 NOTE — Progress Notes (Signed)
CSW met with pt during clinic visit to check in.  Pt reports she is doing ok- staying busy with the kids starting back at school.  No medical or social concerns reported at this time.  CSW will continue to follow and assist as needed  Jorge Ny, [facility] Desk#: [phone] Cell#: [phone]

## 2020-05-17 NOTE — Progress Notes (Signed)
Paramedicine Encounter   [name] , female,   DOB: 12/05/1980,39 y.o.,  MRN: [id number]   Met patient in clinic today with provider.   B/p-126/92 p-92 sp02-95 Weight @ clinic-356  Being referred to Bariatric program at [facility] for surgery.  Going to gym 3x a week.  mclean wants her to go 5x a week.  Increase entresto to 49/67m.  Weight up 6 lbs from last visit.   KMarylouise Stacks EContra Costa9/09/2019

## 2020-05-18 ENCOUNTER — Other Ambulatory Visit (HOSPITAL_COMMUNITY): Payer: Self-pay

## 2020-05-18 DIAGNOSIS — F17210 Nicotine dependence, cigarettes, uncomplicated: Secondary | ICD-10-CM | POA: Diagnosis not present

## 2020-05-18 DIAGNOSIS — Z7984 Long term (current) use of oral hypoglycemic drugs: Secondary | ICD-10-CM | POA: Diagnosis not present

## 2020-05-18 DIAGNOSIS — E119 Type 2 diabetes mellitus without complications: Secondary | ICD-10-CM | POA: Diagnosis not present

## 2020-05-18 DIAGNOSIS — Z452 Encounter for adjustment and management of vascular access device: Secondary | ICD-10-CM | POA: Diagnosis not present

## 2020-05-18 DIAGNOSIS — I5023 Acute on chronic systolic (congestive) heart failure: Secondary | ICD-10-CM | POA: Diagnosis not present

## 2020-05-18 DIAGNOSIS — J45909 Unspecified asthma, uncomplicated: Secondary | ICD-10-CM | POA: Diagnosis not present

## 2020-05-18 DIAGNOSIS — Z6841 Body Mass Index (BMI) 40.0 and over, adult: Secondary | ICD-10-CM | POA: Diagnosis not present

## 2020-05-18 DIAGNOSIS — O903 Peripartum cardiomyopathy: Secondary | ICD-10-CM | POA: Diagnosis not present

## 2020-05-18 DIAGNOSIS — I340 Nonrheumatic mitral (valve) insufficiency: Secondary | ICD-10-CM | POA: Diagnosis not present

## 2020-05-18 NOTE — Progress Notes (Signed)
Advanced Heart Failure Clinic Note   Referring Physician:PCP: System, Provider Not In PCP-Cardiologist: Dr. 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   She presented to the ER on 06/17/19 with several days of worsening HF with SOB at rest, marked edema and orthopnea/PND, not responding to increased doses of oral diuretics. She admitted to high levels of water and Gatorade intake. Her admit wt was 380 lb (Dry wt ~360 lb). She was started on IV Lasix but had initial poor urinary response. PO metolazone added as adjunct. Echo showed EF <20%, relatively normal RV, moderate MR. RHC 06/21/19 with low output (CI 1.8) and elevated PCWP but near normal RA pressure. She was started on inotropic therapy w/ milrinone for low output, 0.25 mcg/kg/min. Also treated w/ Entresto, Coreg, spironolactone, digoxin and ivabradine. She was later transitioned off of IV Lasix to po torsemide 80 mg qam/60 mg qpm.  Unfortunately, she was not a transplant candidate due to her morbid obesity. We discussed her at [facility] with Dr. Donata Clay. She will need significant weight loss to get to LVAD. Will use home milrinone to try to facilitate increased activity to work towards weight loss. She understands that milrinone is not a good end-point and that we would like to use it as a bridge to eventual LVAD. RV function seems adequate for LVAD. Tunneled catheter was placed for home milrinone and home health was arranged. She was discharged home on 06/24/20 on milrinone 0.25 mcg/kg/min.   06/27/19 she presented to the [facility] w/ CC of palpitations. Was brought in by EMS and was reportedly in SVT with HR in the 180s. She was given adenosine and converted to NSR prior to arrival to the ED.  PICC exchanged 10/15/19  She returns today for followup of CHF.  She has felt great on milrinone but has been unable to lose weight.  Weight is up 7 lbs today.  She  decided not to participate in Healthy Weight and Wellness program.  She has a Photographer and is using it. She goes 3 times a week and walks 2 miles on treadmill or uses stationary bike or elliptical.  As long as she takes all her meds, dyspnea only with heavy activity.  She can climb a flight of stairs and even played some basketball recently.  She is using CPAP.  She is interested in bariatric surgery.    Labs (8/21): K 4.4, creatinine 0.9, hgb 14.4  PMH:  1. Chronic systolic CHF: Nonischemic cardiomyopathy.  No family history of cardiomyopathy, no heavy ETOH, cocaine, or amphetamines.  Medtronic ICD.  - Echo (12/16) with EF 20-25%, moderate MR.  - Echo (5/17) with severely dilated LV, EF 10-15%, severe functional MR, mildly decreased RV systolic function. - LHC/RHC (7/98): No significant coronary disease; mean RA 15, PA 61/25 mean 41, mean PCWP 18, CI 1.7 (Fick), PVR 5.4 WU.  - Echo (10/17): EF 20%, severely dilated LV, normal RV size with mildly decreased systolic function, moderate-severe central MR.  - Echo 04/13/17  LVEF 15-20%, Severe LV dilation, Mod LAE, Mild RAE - CPX (5/19): RER 0.95, peak VO2 9.8, VE/VCO2 26 => submaximal but likely severe functional impairement.  - Echo (7/19): EF 20-25%, moderate LV dilation, RV normal size with mildly decreased systolic function, moderate central MR.  - Echo (10/20): EF < 20%, severe LV enlargement, normal RV>  - RHC (10/20): mean RA 7, PA 65/25,  mean PCWP 27, CI 1.8, PVR 3.87, PAPI 5.7.  2. Morbid obesity.  3. 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  Echo (10/17) with moderate-severe MR. Echo (7/19) with moderate central MR.  4. OSA: CPAP.  5. SVT  Current Outpatient Medications  Medication Sig Dispense Refill  . atorvastatin (LIPITOR) 10 MG tablet Take 1 tablet (10 mg total) by mouth daily. (Patient taking differently: Take 10 mg by mouth daily. ) 90 tablet 3  . carvedilol (COREG) 12.5 MG tablet Take 1 tablet (12.5 mg total) by  mouth 2 (two) times daily. Dose increase 180 tablet 2  . digoxin (LANOXIN) 0.125 MG tablet Take 1 tablet (0.125 mg total) by mouth daily. (Patient taking differently: Take 0.125 mg by mouth daily. ) 90 tablet 3  . ivabradine (CORLANOR) 7.5 MG TABS tablet Take 1 tablet (7.5 mg total) by mouth 2 (two) times daily with a meal. 60 tablet 3  . Magnesium Oxide 400 (240 Mg) MG TABS Take 2 tablets by mouth every morning and 1 tablet by mouth every evening 90 tablet 6  . metolazone (ZAROXOLYN) 2.5 MG tablet Take only as directed by the CHF clinic (Patient taking differently: 2.5 mg once a week. Take only as directed by the CHF clinic) 10 tablet 3  . milrinone (PRIMACOR) 20 MG/100 ML SOLN infusion Inject 0.0414 mg/min into the vein continuous.    . potassium chloride (KLOR-CON) 10 MEQ tablet Take 80 meq (8 tabs) in the morning, 60 meq (6 tabs) in the afternoon, and 60 meq (6 tabs) in the evening (Patient taking differently: Take 80 meq (8 tabs) in the morning, 60 meq (6 tabs) in the afternoon, and 60 meq (6 tabs) in the evening) 600 tablet 5  . PRESCRIPTION MEDICATION CPAP- AT BEDTIME    . spironolactone (ALDACTONE) 25 MG tablet Take 1 tablet (25 mg total) by mouth daily. (Patient taking differently: Take 25 mg by mouth daily. ) 90 tablet 3  . torsemide (DEMADEX) 20 MG tablet Take 5 tablets (100 mg total) by mouth in the morning AND 4 tablets (80 mg total) every evening. 720 tablet 3  . albuterol (PROAIR HFA) 108 (90 Base) MCG/ACT inhaler Inhale 2 puffs into the lungs every 6 (six) hours as needed for wheezing or shortness of breath.  (Patient not taking: Reported on 04/05/2020)    . ipratropium-albuterol (DUONEB) 0.5-2.5 (3) MG/3ML SOLN Take 3 mLs by nebulization every 4 (four) hours as needed. (Patient not taking: Reported on 03/15/2020) 360 mL 0  . potassium chloride (KLOR-CON) 20 MEQ packet Take by mouth.  (Patient not taking: Reported on 03/15/2020)    . sacubitril-valsartan (ENTRESTO) 49-51 MG Take 1 tablet by  mouth 2 (two) times daily. 60 tablet 3   No current facility-administered medications for this encounter.    Allergies  Allergen Reactions  . Metformin And Related Diarrhea    And caused severe "dry mouth," also      Social History   Socioeconomic History  . Marital status: Single    Spouse name: Not on file  . Number of children: 2  . Years of education: Not on file  . Highest education level: Not on file  Occupational History  . Not on file  Tobacco Use  . Smoking status: Former Smoker    Quit date: 10/19/2015    Years since quitting: 4.5  . Smokeless tobacco: Never Used  Vaping Use  . Vaping Use: Never assessed  Substance and Sexual Activity  . Alcohol use: Yes  Alcohol/week: 0.0 standard drinks    Comment: occasionally  . Drug use: Yes    Types: Marijuana    Comment: only socially  . Sexual activity: Yes  Other Topics Concern  . Not on file  Social History Narrative  . Not on file   Social Determinants of Health   Financial Resource Strain: High Risk  . Difficulty of Paying Living Expenses: Hard  Food Insecurity: Food Insecurity Present  . Worried About Programme researcher, broadcasting/film/video in the Last Year: Sometimes true  . Ran Out of Food in the Last Year: Sometimes true  Transportation Needs: No Transportation Needs  . Lack of Transportation (Medical): No  . Lack of Transportation (Non-Medical): No  Physical Activity:   . Days of Exercise per Week: Not on file  . Minutes of Exercise per Session: Not on file  Stress:   . Feeling of Stress : Not on file  Social Connections:   . Frequency of Communication with Friends and Family: Not on file  . Frequency of Social Gatherings with Friends and Family: Not on file  . Attends Religious Services: Not on file  . Active Member of Clubs or Organizations: Not on file  . Attends Banker Meetings: Not on file  . Marital Status: Not on file  Intimate Partner Violence:   . Fear of Current or Ex-Partner: Not on file   . Emotionally Abused: Not on file  . Physically Abused: Not on file  . Sexually Abused: Not on file      Family History  Problem Relation Age of Onset  . Heart attack Mother   . Asthma Mother   . Heart failure Mother   . Hypertension Father   . Diabetes Brother     Vitals:   05/17/20 1357  BP: (!) 127/92  Pulse: 92  SpO2: 95%  Weight: (!) 161.5 kg (356 lb)   Wt Readings from Last 3 Encounters:  05/17/20 (!) 161.5 kg (356 lb)  03/14/20 (!) 158.7 kg (349 lb 12.8 oz)  02/09/20 (!) 156.9 kg (346 lb)    PHYSICAL EXAM General: NAD, obese.  Neck: Thick. No JVD, no thyromegaly or thyroid nodule.  Lungs: Clear to auscultation bilaterally with normal respiratory effort. CV: Nondisplaced PMI.  Heart regular S1/S2, no S3/S4, no murmur.  No peripheral edema.  Normal pedal pulses.  No carotid bruit.  Abdomen: Soft, nontender, no hepatosplenomegaly, no distention.  Skin: Intact without lesions or rashes.  Neurologic: Alert and oriented x 3.  Psych: Normal affect. Extremities: No clubbing or cyanosis.  HEENT: Normal.   ASSESSMENT & PLAN: 1. Chronic systolic CHF:  Nonischemic cardiomyopathy.  Body habitus precludes cardiac MRI.  Echo 06/2019 with EF < 20%, relatively normal RV, moderate MR.  RHC 06/20/20 with low output (CI 1.8) and elevated PCWP but near normal RA pressure, started on milrinone 0.25 with plans to continue home milrinone as bridge to possible LVAD if she could achieve significant weight loss.  Unfortunately, she has not succeeded in losing any weight. She feels good with milrinone, NYHA class II.  She is not volume overloaded on exam.  - Continue Coreg 12.5 mg bid.   - Increase Entresto to 49/51 bid with BMET today and again in 10 days.   - Continue ivabradine to 7.5 mg twice a day.   - Continue spironolactone 25 daily.  - Continue digoxin 0.125 mg, check level today.  - Will try to add [place] next.   - For now, will continue milrinone  gtt.  However, she has had lack  of progress on weight loss after almost a year.  Will try to aggressively titrate po meds and possibly get her off milrinone in the future.  - VAD consideration: Her size and social support will be issues.  She would not be a transplant candidate at this point due to weight. We discussed her at [facility], Dr. Donata Clay has seen.  She will need significant weight loss to get to LVAD.  Have been using home milrinone to try to facilitate increased activity to work towards weight loss.  She understands that milrinone is not a good end-point and that we would like to use it as a bridge to eventual LVAD. RV function seems adequate for LVAD.  However, she has made no progress towards weight loss.  2. Morbid obesity: BMI 59 today.  No progress so far towards weight loss.  Too high risk for bariatric surgery per [facility] program.  - Will refer her to [facility] for bariatric surgery consideration.  - Increase exercise to 5-6 days/week.  3. SVT: had ED visit 10/11 for SVT noted by EMS that converted back to NSR after dose of adenosine. This was in the setting of hypokalemia and hypomagnesemia, at 3.0 and 1.5 respectively. Also in the setting of inotropic therapy w/ milrinone.   4. OSA: Would like her to be more compliant with CPAP.  5. DMII: Add Farxiga next.   Followup with HF pharmacist in 3 wks for medication titration, see NP/PA in 6 wks.   Marca Ancona, MD 05/18/20

## 2020-05-18 NOTE — Progress Notes (Signed)
Came out to see pt for f/u post clinic visit yesterday to do med rec and to try to verify if she missed any doses of her potassium b/c it was low yesterday. But she was not home. Her youngest daughter arrived while I was waiting getting off the school bus and she doesn't know where her mom is and their phones have been cut off, which makes sense why my calls and texts are not going through.   I did come back out an hour later and she still was not home.  Will try to f/u next week first thing.   Kerry Hough, EMT-Paramedic  05/18/20

## 2020-05-19 ENCOUNTER — Telehealth (HOSPITAL_COMMUNITY): Payer: Self-pay | Admitting: Licensed Clinical Social Worker

## 2020-05-19 NOTE — Telephone Encounter (Signed)
CSW followed up with pt today regarding her low potassium levels since paramedic was unable to reach yesterday.   Pt reports that she was not taking potassium correctly when she was out of town (got back to town Tuesday Aug 24th- states that she has been taking potassium correctly since then.  CSW explained that her potassium level was low and that we need to get her in for labs early next week to see if her potassium is a normal level after she continues to take medication correctly- pt expressed understanding and clinic RN scheduled her for 10am on Tuesday.  CSW will continue to follow and assist as needed  Burna Sis, LCSW Clinical Social Worker [facility] Desk#: [phone] Cell#: [phone]

## 2020-05-23 ENCOUNTER — Other Ambulatory Visit (HOSPITAL_COMMUNITY): Payer: Self-pay

## 2020-05-24 DIAGNOSIS — J45909 Unspecified asthma, uncomplicated: Secondary | ICD-10-CM | POA: Diagnosis not present

## 2020-05-24 DIAGNOSIS — E119 Type 2 diabetes mellitus without complications: Secondary | ICD-10-CM | POA: Diagnosis not present

## 2020-05-24 DIAGNOSIS — Z6841 Body Mass Index (BMI) 40.0 and over, adult: Secondary | ICD-10-CM | POA: Diagnosis not present

## 2020-05-24 DIAGNOSIS — O903 Peripartum cardiomyopathy: Secondary | ICD-10-CM | POA: Diagnosis not present

## 2020-05-24 DIAGNOSIS — F17210 Nicotine dependence, cigarettes, uncomplicated: Secondary | ICD-10-CM | POA: Diagnosis not present

## 2020-05-24 DIAGNOSIS — I340 Nonrheumatic mitral (valve) insufficiency: Secondary | ICD-10-CM | POA: Diagnosis not present

## 2020-05-24 DIAGNOSIS — Z7984 Long term (current) use of oral hypoglycemic drugs: Secondary | ICD-10-CM | POA: Diagnosis not present

## 2020-05-24 DIAGNOSIS — I5023 Acute on chronic systolic (congestive) heart failure: Secondary | ICD-10-CM | POA: Diagnosis not present

## 2020-05-24 DIAGNOSIS — Z452 Encounter for adjustment and management of vascular access device: Secondary | ICD-10-CM | POA: Diagnosis not present

## 2020-05-25 ENCOUNTER — Other Ambulatory Visit (HOSPITAL_COMMUNITY): Payer: Self-pay

## 2020-05-30 ENCOUNTER — Ambulatory Visit (HOSPITAL_COMMUNITY)
Admission: EM | Admit: 2020-05-30 | Discharge: 2020-05-30 | Disposition: A | Discharge status: Home / Self Care | Payer: Medicaid Other | Attending: Internal Medicine | Admitting: Internal Medicine

## 2020-05-30 ENCOUNTER — Encounter (HOSPITAL_COMMUNITY): Payer: Self-pay

## 2020-05-30 ENCOUNTER — Other Ambulatory Visit: Payer: Self-pay

## 2020-05-30 DIAGNOSIS — Z1152 Encounter for screening for COVID-19: Secondary | ICD-10-CM

## 2020-05-30 DIAGNOSIS — Z20822 Contact with and (suspected) exposure to covid-19: Secondary | ICD-10-CM | POA: Insufficient documentation

## 2020-05-30 DIAGNOSIS — Z0189 Encounter for other specified special examinations: Secondary | ICD-10-CM

## 2020-05-30 LAB — SARS CORONAVIRUS 2 (TAT 6-24 HRS): SARS Coronavirus 2: NEGATIVE

## 2020-05-30 NOTE — ED Triage Notes (Signed)
Pt requested COVID test. Denies cough, fever, sob or any other symptoms.

## 2020-05-31 DIAGNOSIS — O903 Peripartum cardiomyopathy: Secondary | ICD-10-CM | POA: Diagnosis not present

## 2020-05-31 DIAGNOSIS — E119 Type 2 diabetes mellitus without complications: Secondary | ICD-10-CM | POA: Diagnosis not present

## 2020-05-31 DIAGNOSIS — Z7984 Long term (current) use of oral hypoglycemic drugs: Secondary | ICD-10-CM | POA: Diagnosis not present

## 2020-05-31 DIAGNOSIS — I340 Nonrheumatic mitral (valve) insufficiency: Secondary | ICD-10-CM | POA: Diagnosis not present

## 2020-05-31 DIAGNOSIS — Z452 Encounter for adjustment and management of vascular access device: Secondary | ICD-10-CM | POA: Diagnosis not present

## 2020-05-31 DIAGNOSIS — I5023 Acute on chronic systolic (congestive) heart failure: Secondary | ICD-10-CM | POA: Diagnosis not present

## 2020-05-31 DIAGNOSIS — J45909 Unspecified asthma, uncomplicated: Secondary | ICD-10-CM | POA: Diagnosis not present

## 2020-05-31 DIAGNOSIS — F17210 Nicotine dependence, cigarettes, uncomplicated: Secondary | ICD-10-CM | POA: Diagnosis not present

## 2020-05-31 DIAGNOSIS — Z6841 Body Mass Index (BMI) 40.0 and over, adult: Secondary | ICD-10-CM | POA: Diagnosis not present

## 2020-05-31 NOTE — Progress Notes (Signed)
PCP-Cardiologist: Dr. 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   She presentedto the ER on 10/1/20with several days of worsening HF with SOB at rest, marked edema and orthopnea/PND,not responding to increased doses of oral diuretics. She admitted to high levels of water and Gatorade intake. Her admit weight was 380 lb (Dry wt ~360 lb). She was started on IV Lasix but had initial poor urinary response. PO metolazone added as adjunct. Echo showedEF <20%, relatively normal RV, moderate MR.RHC 06/21/19 with low output (CI 1.8) and elevated PCWP but near normal RA pressure.She was started on inotropic therapy w/ milrinone for low output, 0.25 mcg/kg/min. Also treated w/ Entresto, carvedilol, spironolactone, digoxin and ivabradine. She was later transitioned off of IV Lasix to po torsemide80 mg qam/60 mg qpm.  Unfortunately, she was not a transplant candidate due to her morbid obesity.We discussed her at [facility] Dr. Donata Clay. She will need significant weight loss to get to LVAD. Will use home milrinone to try to facilitate increased activity to work towards weight loss. She understands that milrinone is not a good end-point and that we would like to use it as a bridge to eventual LVAD. RV function seems adequate for LVAD.Tunneled catheter was placed for home milrinone and home health was arranged. She was discharged home on 06/24/20 on milrinone 0.25 mcg/kg/min.   06/27/19 she presented to the [facility] w/ CC of palpitations. Was brought in by EMS and was reportedly in SVT with HR in the 180s. She was given adenosine and converted to NSR prior to arrival to the ED.  PICC exchanged 10/15/19  Recently presented to HF Clinic on 05/17/20 with Dr. Shirlee Latch. She reported feeling great on milrinone but had been unable to lose weight.  Weight was up 7 lbs in clinic.  She decided not to participate in Healthy Weight  and Wellness program.  She had a gym membership and reported using it. She goes 3 times a week and walks 2 miles on treadmill or uses stationary bike or elliptical.  As long as she takes all her meds, dyspnea only with heavy activity.  She could climb a flight of stairs and even played some basketball recently.  She was using CPAP.  She was interested in bariatric surgery.    Today she returns to HF clinic for pharmacist medication titration. At last visit with MD, Sherryll Burger was increased to 49/51 mg BID. Overall she is feeling ok today. Notes she has not been taking her fluid pills consistently this past week, so she feels more SOB. Can usually walk 2 miles without SOB, but now can only walk from the parking lot to clinic. Notes occasional dizziness when she gets up quickly. No chest pain or palpitations. Her weight is down 3 lbs from last clinic visit.  Had not taken any torsemide today, she will take it when she gets home. No PND/orthopnea.    HF Medications: Milrinone 0.0414 mg/min Carvedilol 12.5 mg BID Entresto 49/51 mg BID Spironolactone 25 mg daily Digoxin 0.125 mg daily Corlanor 7.5 mg BID Torsemide 100 mg AM/80 mg PM Metolazone 2.5 mg once weekly Potassium chloride 80mEq/60mEq/60mEq  Has the patient been experiencing any side effects to the medications prescribed?  no  Does the patient have any problems obtaining medications due to transportation or finances?   No -Has [place] Medicaid  Understanding of regimen: fair Understanding of indications: fair Potential of compliance: poor Patient  understands to avoid NSAIDs. Patient understands to avoid decongestants.    Pertinent Lab Values: . Serum creatinine 0.97, BUN 8, Potassium 4.0, Sodium 136  Vital Signs: . Weight: 353 lbs (last clinic weight: 356 lbs) . Blood pressure: 106/62  . Heart rate: 91   Assessment: 1. Chronic systolic CHF:  Nonischemic cardiomyopathy.  Body habitus precludes cardiac MRI.  Echo 06/2019 with EF <20%,  relatively normal RV, moderate MR. RHC 06/20/20 with low output (CI 1.8) and elevated PCWP but near normal RA pressure, started on milrinone 0.25 with plans to continue home milrinone as bridge to possible LVAD if she could achieve significant weight loss.  Unfortunately, she has not succeeded in losing any weight.  - She feels good with milrinone, NYHA class II.  Volume status stable on exam.  - Continue carvedilol 12.5 mg BID.   - Continue Entresto 49/51 mg BID - Continue spironolactone 25 daily.  - Continue ivabradine 7.5 mg twice a day.   - Continue digoxin 0.125 mg, <0.2 ng/mL (05/17/20) - Start dapagliflozin 10 mg daily.    - Per Dr. Shirlee Latch, will continue milrinone gtt.  However, she has had lack of progress on weight loss after almost a year.  Will try to aggressively titrate po meds and possibly get her off milrinone in the future.  - VAD consideration: Her size and social support will be issues. She would not be a transplant candidate at this point due to weight. We discussed her at [facility], Dr. Donata Clay has seen. She will need significant weight loss to get to LVAD. Have been using home milrinone to try to facilitate increased activity to work towards weight loss. She understands that milrinone is not a good end-point and that we would like to use it as a bridge to eventual LVAD. RV function seems adequate for LVAD.  However, she has made no progress towards weight loss.  2. Morbid obesity: BMI 59 today.  No progress so far towards weight loss.  Too high risk for bariatric surgery per [facility] program.  - Referred to [facility] for bariatric surgery consideration.  - Increase exercise to 5-6 days/week.  3. SVT: had ED visit 10/11 for SVT noted by EMS that converted back to NSR after dose of adenosine. This was in the setting of hypokalemia and hypomagnesemia, at 3.0 and 1.5 respectively. Also in the setting of inotropic therapy w/ milrinone.   4. OSA: Would like her to be more compliant with  CPAP.  5. DMII: start dapagliflozin as above   Plan: 1) Medication changes: Based on clinical presentation, vital signs and recent labs will start dapagliflozin 10 mg daily.  2) Labs: Scr 0.97, K 4.0 3) Follow-up: 4 weeks with [facility]   Karle Plumber, PharmD, BCPS, BCCP, CPP Heart Failure Clinic Pharmacist [phone]

## 2020-06-01 ENCOUNTER — Other Ambulatory Visit (HOSPITAL_COMMUNITY): Payer: Self-pay

## 2020-06-01 NOTE — Progress Notes (Signed)
Paramedicine Encounter    [name], female    DOB: 1981/08/10, 39 y.o.   MRN: [id number]   Patient Care Team: Patient, No Pcp Per as PCP - General (General Practice) Regan Lemming, MD as PCP - Electrophysiology (Cardiology) Laurey Morale, MD as PCP - Advanced Heart Failure (Cardiology)  Patient Active Problem List   Diagnosis Date Noted   Acute on chronic systolic CHF (congestive heart failure) (HCC) 06/17/2019   Obstructive sleep apnea 06/17/2019   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   Diabetes mellitus (HCC) 06/17/2019   Amenorrhea 08/03/2018   Congestive heart failure (HCC) 06/29/2018   Hypotension 06/07/2018   Rhinovirus infection 06/06/2018   Asthma 06/05/2018   Acute respiratory failure with hypoxia (HCC) 06/05/2018   Hypokalemia 06/05/2018   Snoring 09/23/2016   Cough 04/10/2016   NICM (nonischemic cardiomyopathy) (HCC) 01/25/2016   Tobacco abuse 01/25/2016   Acute on chronic combined systolic and diastolic CHF (congestive heart failure) (HCC) 01/25/2016   Mitral regurgitation    Abnormal EKG-inf TWI 01/15/2016   Positive D dimer-CTA neg for PE 01/15/2016   Morbid obesity- BMI 57 01/14/2016   Abdominal pain 01/14/2016   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 (Patient taking differently: Take 10 mg by mouth daily. ), Disp: 90 tablet, Rfl: 3   digoxin (LANOXIN) 0.125 MG tablet, Take 1 tablet (0.125 mg total) by mouth daily. (Patient taking differently: Take 0.125 mg by mouth daily. ), Disp: 90 tablet, Rfl: 3   ivabradine (CORLANOR) 7.5 MG TABS tablet, Take 1 tablet (7.5 mg total) by mouth 2 (two) times daily with a meal., Disp: 60 tablet, Rfl:  3   Magnesium Oxide 400 (240 Mg) MG TABS, Take 2 tablets by mouth every morning and 1 tablet by mouth every evening, Disp: 90 tablet, Rfl: 6   metolazone (ZAROXOLYN) 2.5 MG tablet, Take only as directed by the CHF clinic (Patient taking differently: 2.5 mg once a week. Take only as directed by the CHF clinic), Disp: 10 tablet, Rfl: 3   milrinone (PRIMACOR) 20 MG/100 ML SOLN infusion, Inject 0.0414 mg/min into the vein continuous., Disp:  , Rfl:    potassium chloride (KLOR-CON) 10 MEQ tablet, Take 80 meq (8 tabs) in the morning, 60 meq (6 tabs) in the afternoon, and 60 meq (6 tabs) in the evening (Patient taking differently: Take 80 meq (8 tabs) in the morning, 60 meq (6 tabs) in the afternoon, and 60 meq (6 tabs) in the evening), Disp: 600 tablet, Rfl: 5   PRESCRIPTION MEDICATION, CPAP- AT BEDTIME, Disp: , Rfl:    sacubitril-valsartan (ENTRESTO) 49-51 MG, Take 1 tablet by mouth 2 (two) times daily., Disp: 60 tablet, Rfl: 3   spironolactone (ALDACTONE) 25 MG tablet, Take 1 tablet (25 mg total) by mouth daily. (Patient taking differently: Take 25 mg by mouth daily. ), Disp: 90 tablet, Rfl: 3   torsemide (DEMADEX) 20 MG tablet, Take 5 tablets (100 mg total) by mouth in the morning AND 4 tablets (80 mg total) every evening., Disp: 720 tablet, Rfl: 3   albuterol (PROAIR HFA) 108 (90 Base) MCG/ACT inhaler, Inhale 2 puffs into the lungs every 6 (six) hours as needed for wheezing or shortness of breath.  (Patient not  taking: Reported on 04/05/2020), Disp: , Rfl:    carvedilol (COREG) 12.5 MG tablet, Take 1 tablet (12.5 mg total) by mouth 2 (two) times daily. Dose increase, Disp: 180 tablet, Rfl: 2   ipratropium-albuterol (DUONEB) 0.5-2.5 (3) MG/3ML SOLN, Take 3 mLs by nebulization every 4 (four) hours as needed. (Patient not taking: Reported on 03/15/2020), Disp: 360 mL, Rfl: 0   potassium chloride (KLOR-CON) 20 MEQ packet, Take by mouth.  (Patient not taking: Reported on 03/15/2020), Disp: , Rfl:   Allergies  Allergen Reactions   Metformin Diarrhea    And caused severe "dry mouth," also   Metformin And Related Diarrhea    And caused severe "dry mouth," also      Social History   Socioeconomic History   Marital status: Single    Spouse name: Not on file   Number of children: 2   Years of education: Not on file   Highest education level: Not on file  Occupational History   Not on file  Tobacco Use   Smoking status: Former Smoker    Quit date: 10/19/2015    Years since quitting: 4.6   Smokeless tobacco: Never Used  Vaping Use   Vaping Use: Never assessed  Substance and Sexual Activity   Alcohol use: Yes    Alcohol/week: 0.0 standard drinks    Comment: occasionally   Drug use: Yes    Types: Marijuana    Comment: only socially   Sexual activity: Yes  Other Topics Concern   Not on file  Social History Narrative   Not on file   Social Determinants of Health   Financial Resource Strain: High Risk   Difficulty of Paying Living Expenses: Hard  Food Insecurity: Food Insecurity Present   Worried About Running Out of Food in the Last Year: Sometimes true   Ran Out of Food in the Last Year: Sometimes true  Transportation Needs: No Transportation Needs   Lack of Transportation (Medical): No   Lack of Transportation (Non-Medical): No  Physical Activity:    Days of Exercise per Week: Not on file   Minutes of Exercise per Session: Not on file  Stress:    Feeling of Stress : Not on file  Social Connections:    Frequency of Communication with Friends and Family: Not on file   Frequency of Social Gatherings with Friends and Family: Not on file   Attends Religious Services: Not on file   Active Member of Clubs or Organizations: Not on file   Attends Banker Meetings: Not on file   Marital Status: Not on file  Intimate Partner Violence:    Fear of Current or Ex-Partner: Not on file   Emotionally Abused: Not on file    Physically Abused: Not on file   Sexually Abused: Not on file    Physical Exam      Future Appointments  Date Time Provider Department Center  06/13/2020  1:00 PM [facility] PHARMACY [facility] None  07/06/2020 11:30 AM [facility] PA/NP [facility] None  07/24/2020  7:45 AM [facility] DEVICE REMOTES [facility]    BP (!) 90/0    Pulse 82    Temp (!) 97.2 F (36.2 C)    Resp 18    SpO2 98%   Weight yesterday-350 Last visit weight-356  Pt reports she is doing good. 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  She did not take her metolazone yesterday due to working and she will rest some today  and then take the metolazone.  She reports being compliant with her meds. meds verified and pill box refilled- The new dose of entresto 49-51 just starting today.  She admitted to missing a few doses of her meds, and then admitted to missing more than a few further in the conversation, but the pill bottles are proof that she is missing many due to the amount she has left. She states she has these moments where she gets tired of having to take all the medicine. I spoke to her at length the purpose and goal of the medications and if she wants to pursue LVAD and ultimately heart transplant then she needs to step it up and not give the doctors any reason to say no to her b/c of non-compliance b/c the LVAD and transplant will not be an easy road for her either.  She said her mind set is good again and she will get back on track with her meds.  She is not able to go to gym unless she pays $10 each time she goes to pay on that $54 late charge she has on there.  Will f/u next week.    Kerry Hough, EMT-Paramedic [phone] [address] Paramedic  06/01/20

## 2020-06-06 ENCOUNTER — Other Ambulatory Visit (HOSPITAL_COMMUNITY): Payer: Self-pay

## 2020-06-06 NOTE — Progress Notes (Signed)
Paramedicine Encounter    [name], female    DOB: 1981/07/05, 39 y.o.   MRN: [id number]   Patient Care Team: Patient, No Pcp Per as PCP - General (General Practice) Regan Lemming, MD as PCP - Electrophysiology (Cardiology) Laurey Morale, MD as PCP - Advanced Heart Failure (Cardiology)  Patient Active Problem List   Diagnosis Date Noted  . Acute on chronic systolic CHF (congestive heart failure) (HCC) 06/17/2019  . Obstructive sleep apnea 06/17/2019  . 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 Diabetes mellitus (HCC) 06/17/2019  . Amenorrhea 08/03/2018  . Congestive heart failure (HCC) 06/29/2018  . Hypotension 06/07/2018  . Rhinovirus infection 06/06/2018  . Asthma 06/05/2018  . Acute respiratory failure with hypoxia (HCC) 06/05/2018  . Hypokalemia 06/05/2018  . Snoring 09/23/2016  . Cough 04/10/2016  . 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 Tobacco abuse 01/25/2016  . Acute on chronic combined systolic and diastolic CHF (congestive heart failure) (HCC) 01/25/2016  . Mitral regurgitation   . Abnormal EKG-inf TWI 01/15/2016  . Positive D dimer-CTA neg for PE 01/15/2016  . Morbid obesity- BMI 57 01/14/2016  . Abdominal pain 01/14/2016  . 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 At risk for sleep apnea 01/14/2016  . Cardiomyopathy (HCC) 11/01/2015  . Morbid obesity with BMI of 50.0-59.9, adult (HCC) 08/16/2015    Current Outpatient Medications:  .  albuterol (PROAIR HFA) 108 (90 Base) MCG/ACT inhaler, Inhale 2 puffs into the lungs every 6 (six) hours as needed for wheezing or shortness of breath.  (Patient not taking: Reported on 04/05/2020), Disp: , Rfl:  .  atorvastatin (LIPITOR) 10 MG tablet, Take 1 tablet (10 mg total) by mouth daily. (Patient taking differently: Take 10 mg by mouth daily. ), Disp: 90 tablet, Rfl: 3 .  carvedilol (COREG) 12.5 MG tablet, Take 1 tablet (12.5 mg total) by mouth 2 (two) times  daily. Dose increase, Disp: 180 tablet, Rfl: 2 .  digoxin (LANOXIN) 0.125 MG tablet, Take 1 tablet (0.125 mg total) by mouth daily. (Patient taking differently: Take 0.125 mg by mouth daily. ), Disp: 90 tablet, Rfl: 3 .  ipratropium-albuterol (DUONEB) 0.5-2.5 (3) MG/3ML SOLN, Take 3 mLs by nebulization every 4 (four) hours as needed. (Patient not taking: Reported on 03/15/2020), Disp: 360 mL, Rfl: 0 .  ivabradine (CORLANOR) 7.5 MG TABS tablet, Take 1 tablet (7.5 mg total) by mouth 2 (two) times daily with a meal., Disp: 60 tablet, Rfl: 3 .  Magnesium Oxide 400 (240 Mg) MG TABS, Take 2 tablets by mouth every morning and 1 tablet by mouth every evening, Disp: 90 tablet, Rfl: 6 .  metolazone (ZAROXOLYN) 2.5 MG tablet, Take only as directed by the CHF clinic (Patient taking differently: 2.5 mg once a week. Take only as directed by the CHF clinic), Disp: 10 tablet, Rfl: 3 .  milrinone (PRIMACOR) 20 MG/100 ML SOLN infusion, Inject 0.0414 mg/min into the vein continuous., Disp:  , Rfl:  .  potassium chloride (KLOR-CON) 10 MEQ tablet, Take 80 meq (8 tabs) in the morning, 60 meq (6 tabs) in the afternoon, and 60 meq (6 tabs) in the evening (Patient taking differently: Take 80 meq (8 tabs) in the morning, 60 meq (6 tabs) in the afternoon, and 60 meq (6 tabs) in the evening), Disp: 600 tablet, Rfl: 5 .  potassium chloride (KLOR-CON) 20 MEQ packet, Take by mouth.  (Patient not taking: Reported on 03/15/2020), Disp: , Rfl:  .  PRESCRIPTION MEDICATION, CPAP- AT BEDTIME,  Disp: , Rfl:  .  sacubitril-valsartan (ENTRESTO) 49-51 MG, Take 1 tablet by mouth 2 (two) times daily., Disp: 60 tablet, Rfl: 3 .  spironolactone (ALDACTONE) 25 MG tablet, Take 1 tablet (25 mg total) by mouth daily. (Patient taking differently: Take 25 mg by mouth daily. ), Disp: 90 tablet, Rfl: 3 .  torsemide (DEMADEX) 20 MG tablet, Take 5 tablets (100 mg total) by mouth in the morning AND 4 tablets (80 mg total) every evening., Disp: 720 tablet, Rfl:  3 Allergies  Allergen Reactions  . Metformin Diarrhea    And caused severe "dry mouth," also  . Metformin And Related Diarrhea    And caused severe "dry mouth," also      Social History   Socioeconomic History  . Marital status: Single    Spouse name: Not on file  . Number of children: 2  . Years of education: Not on file  . Highest education level: Not on file  Occupational History  . Not on file  Tobacco Use  . Smoking status: Former Smoker    Quit date: 10/19/2015    Years since quitting: 4.6  . Smokeless tobacco: Never Used  Vaping Use  . Vaping Use: Never assessed  Substance and Sexual Activity  . Alcohol use: Yes    Alcohol/week: 0.0 standard drinks    Comment: occasionally  . Drug use: Yes    Types: Marijuana    Comment: only socially  . Sexual activity: Yes  Other Topics Concern  . Not on file  Social History Narrative  . Not on file   Social Determinants of Health   Financial Resource Strain: High Risk  . Difficulty of Paying Living Expenses: Hard  Food Insecurity: Food Insecurity Present  . Worried About Programme researcher, broadcasting/film/video in the Last Year: Sometimes true  . Ran Out of Food in the Last Year: Sometimes true  Transportation Needs: No Transportation Needs  . Lack of Transportation (Medical): No  . Lack of Transportation (Non-Medical): No  Physical Activity:   . Days of Exercise per Week: Not on file  . Minutes of Exercise per Session: Not on file  Stress:   . Feeling of Stress : Not on file  Social Connections:   . Frequency of Communication with Friends and Family: Not on file  . Frequency of Social Gatherings with Friends and Family: Not on file  . Attends Religious Services: Not on file  . Active Member of Clubs or Organizations: Not on file  . Attends Banker Meetings: Not on file  . Marital Status: Not on file  Intimate Partner Violence:   . Fear of Current or Ex-Partner: Not on file  . Emotionally Abused: Not on file  .  Physically Abused: Not on file  . Sexually Abused: Not on file    Physical Exam      Future Appointments  Date Time Provider Department Center  06/13/2020  1:00 PM [facility] PHARMACY [facility] None  07/06/2020 11:30 AM [facility] PA/NP [facility] None  07/24/2020  7:45 AM [facility] DEVICE REMOTES [facility]    BP (!) 104/0   Pulse 96   Temp 97.9 F (36.6 C)   Resp 20   SpO2 98%   Weight today-352  Weight yesterday-? Last visit weight-350  Pt reports she has been very busy lately. She has been working and then running around doing things for church and her friends and she doesn't know how to say no to them. She is feeling very  tired and it is catching up to her. I told she needs to relax and she cannot sustain this schedule like this.  She agrees but feels like she has to work for some extra money to save money for stuff for her car, a ticket, her teeth, christmas etc.  She left her meds at her clients house and has been taking her meds from the bottles. She has admitted to missing some of her potassium and has felt muscle cramps. I cautioned her that low K affects her heart as well and she cant keep missing meds b/c of other things she has going on and that this is a priority.  She has taken her meds today except for fluid pills. She states she will take now and then again tonight.  She reports that the increased dose of entresto has been making her feel a little tired. But with her wacky schedule idk if that tiredness is coming from that or the meds.  Will see her at next week clinic pharmacy visit.    Kerry Hough, EMT-Paramedic [phone] [facility] Paramedic  06/06/20

## 2020-06-07 DIAGNOSIS — J45909 Unspecified asthma, uncomplicated: Secondary | ICD-10-CM | POA: Diagnosis not present

## 2020-06-07 DIAGNOSIS — Z7984 Long term (current) use of oral hypoglycemic drugs: Secondary | ICD-10-CM | POA: Diagnosis not present

## 2020-06-07 DIAGNOSIS — Z452 Encounter for adjustment and management of vascular access device: Secondary | ICD-10-CM | POA: Diagnosis not present

## 2020-06-07 DIAGNOSIS — I340 Nonrheumatic mitral (valve) insufficiency: Secondary | ICD-10-CM | POA: Diagnosis not present

## 2020-06-07 DIAGNOSIS — I5023 Acute on chronic systolic (congestive) heart failure: Secondary | ICD-10-CM | POA: Diagnosis not present

## 2020-06-07 DIAGNOSIS — F17210 Nicotine dependence, cigarettes, uncomplicated: Secondary | ICD-10-CM | POA: Diagnosis not present

## 2020-06-07 DIAGNOSIS — E119 Type 2 diabetes mellitus without complications: Secondary | ICD-10-CM | POA: Diagnosis not present

## 2020-06-07 DIAGNOSIS — Z6841 Body Mass Index (BMI) 40.0 and over, adult: Secondary | ICD-10-CM | POA: Diagnosis not present

## 2020-06-07 DIAGNOSIS — O903 Peripartum cardiomyopathy: Secondary | ICD-10-CM | POA: Diagnosis not present

## 2020-06-13 ENCOUNTER — Other Ambulatory Visit: Payer: Self-pay

## 2020-06-13 ENCOUNTER — Ambulatory Visit (HOSPITAL_COMMUNITY)
Admission: RE | Admit: 2020-06-13 | Discharge: 2020-06-13 | Disposition: A | Discharge status: Home / Self Care | Payer: Medicaid Other | Source: Ambulatory Visit | Attending: Cardiology | Admitting: Cardiology

## 2020-06-13 ENCOUNTER — Other Ambulatory Visit (HOSPITAL_COMMUNITY): Payer: Self-pay

## 2020-06-13 ENCOUNTER — Telehealth (HOSPITAL_COMMUNITY): Payer: Self-pay | Admitting: Pharmacy Technician

## 2020-06-13 VITALS — BP 106/62 | HR 91 | Wt 353.0 lb

## 2020-06-13 DIAGNOSIS — Z6841 Body Mass Index (BMI) 40.0 and over, adult: Secondary | ICD-10-CM | POA: Insufficient documentation

## 2020-06-13 DIAGNOSIS — Z9989 Dependence on other enabling machines and devices: Secondary | ICD-10-CM | POA: Diagnosis not present

## 2020-06-13 DIAGNOSIS — Z95810 Presence of automatic (implantable) cardiac defibrillator: Secondary | ICD-10-CM | POA: Insufficient documentation

## 2020-06-13 DIAGNOSIS — Z5181 Encounter for therapeutic drug level monitoring: Secondary | ICD-10-CM | POA: Insufficient documentation

## 2020-06-13 DIAGNOSIS — Z79899 Other long term (current) drug therapy: Secondary | ICD-10-CM | POA: Insufficient documentation

## 2020-06-13 DIAGNOSIS — G4733 Obstructive sleep apnea (adult) (pediatric): Secondary | ICD-10-CM | POA: Diagnosis not present

## 2020-06-13 DIAGNOSIS — Z7984 Long term (current) use of oral hypoglycemic drugs: Secondary | ICD-10-CM | POA: Diagnosis not present

## 2020-06-13 DIAGNOSIS — I428 Other cardiomyopathies: Secondary | ICD-10-CM | POA: Diagnosis not present

## 2020-06-13 DIAGNOSIS — E119 Type 2 diabetes mellitus without complications: Secondary | ICD-10-CM | POA: Diagnosis not present

## 2020-06-13 DIAGNOSIS — I5022 Chronic systolic (congestive) heart failure: Secondary | ICD-10-CM | POA: Insufficient documentation

## 2020-06-13 LAB — BASIC METABOLIC PANEL
Anion gap: 9 (ref 5–15)
BUN: 8 mg/dL (ref 6–20)
CO2: 25 mmol/L (ref 22–32)
Calcium: 8.8 mg/dL — ABNORMAL LOW (ref 8.9–10.3)
Chloride: 102 mmol/L (ref 98–111)
Creatinine, Ser: 0.97 mg/dL (ref 0.44–1.00)
GFR calc Af Amer: >60 mL/min (ref >60)
GFR calc non Af Amer: >60 mL/min (ref >60)
Glucose, Bld: 214 mg/dL — ABNORMAL HIGH (ref 70–99)
Potassium: 4 mmol/L (ref 3.5–5.1)
Sodium: 136 mmol/L (ref 135–145)

## 2020-06-13 MED ORDER — DAPAGLIFLOZIN PROPANEDIOL 10 MG PO TABS: 10.0000 mg | ORAL_TABLET | Freq: Every day | ORAL | 11 refills | Status: DC

## 2020-06-13 NOTE — Progress Notes (Signed)
Paramedicine Encounter   [name] , female,   DOB: 03-Jul-1981,39 y.o.,  MRN: [id number]   Met patient in clinic today with provider.  Weight @ [id number] B/p-106/systolic  F-41 [id number]  Pharm visit today- She admitted to not taking her meds as directed.  Fluid level is up.  appetite decreased.  farxiga is going to be added today. She has a 30day coupon code for this to take to pharmacy.  Has entresto thru Friday am dose   Needs: Digoxin, entresto, torsemide refilled   [name], EMT-Paramedic [phone] 06/13/2020

## 2020-06-13 NOTE — Patient Instructions (Signed)
It was a pleasure seeing you today!  MEDICATIONS: -We are changing your medications today -Start Farxiga 10 mg (1 tablet) daily -Call if you have questions about your medications.  LABS: -We will call you if your labs need attention.  NEXT APPOINTMENT: Return to clinic in 4 weeks with [facility].  In general, to take care of your heart failure: -Limit your fluid intake to 2 Liters (half-gallon) per day.   -Limit your salt intake to ideally 2-3 grams (2000-3000 mg) per day. -Weigh yourself daily and record, and bring that "weight diary" to your next appointment.  (Weight gain of 2-3 pounds in 1 day typically means fluid weight.) -The medications for your heart are to help your heart and help you live longer.   -Please contact us before stopping any of your heart medications.  Call the clinic at [phone] with questions or to reschedule future appointments.

## 2020-06-13 NOTE — Telephone Encounter (Signed)
Patient Advocate Encounter   Received notification from [employer] that prior authorization for Kirsten Wheeler is required.   PA submitted on [employer]. Key [id number] W Status is pending   Will continue to follow.

## 2020-06-14 DIAGNOSIS — Z7984 Long term (current) use of oral hypoglycemic drugs: Secondary | ICD-10-CM | POA: Diagnosis not present

## 2020-06-14 DIAGNOSIS — O903 Peripartum cardiomyopathy: Secondary | ICD-10-CM | POA: Diagnosis not present

## 2020-06-14 DIAGNOSIS — F17210 Nicotine dependence, cigarettes, uncomplicated: Secondary | ICD-10-CM | POA: Diagnosis not present

## 2020-06-14 DIAGNOSIS — I5023 Acute on chronic systolic (congestive) heart failure: Secondary | ICD-10-CM | POA: Diagnosis not present

## 2020-06-14 DIAGNOSIS — Z6841 Body Mass Index (BMI) 40.0 and over, adult: Secondary | ICD-10-CM | POA: Diagnosis not present

## 2020-06-14 DIAGNOSIS — I340 Nonrheumatic mitral (valve) insufficiency: Secondary | ICD-10-CM | POA: Diagnosis not present

## 2020-06-14 DIAGNOSIS — J45909 Unspecified asthma, uncomplicated: Secondary | ICD-10-CM | POA: Diagnosis not present

## 2020-06-14 DIAGNOSIS — E119 Type 2 diabetes mellitus without complications: Secondary | ICD-10-CM | POA: Diagnosis not present

## 2020-06-14 DIAGNOSIS — Z452 Encounter for adjustment and management of vascular access device: Secondary | ICD-10-CM | POA: Diagnosis not present

## 2020-06-19 DIAGNOSIS — Z6841 Body Mass Index (BMI) 40.0 and over, adult: Secondary | ICD-10-CM | POA: Diagnosis not present

## 2020-06-19 DIAGNOSIS — Z7984 Long term (current) use of oral hypoglycemic drugs: Secondary | ICD-10-CM | POA: Diagnosis not present

## 2020-06-19 DIAGNOSIS — J45909 Unspecified asthma, uncomplicated: Secondary | ICD-10-CM | POA: Diagnosis not present

## 2020-06-19 DIAGNOSIS — I5023 Acute on chronic systolic (congestive) heart failure: Secondary | ICD-10-CM | POA: Diagnosis not present

## 2020-06-19 DIAGNOSIS — F17210 Nicotine dependence, cigarettes, uncomplicated: Secondary | ICD-10-CM | POA: Diagnosis not present

## 2020-06-19 DIAGNOSIS — O903 Peripartum cardiomyopathy: Secondary | ICD-10-CM | POA: Diagnosis not present

## 2020-06-19 DIAGNOSIS — I340 Nonrheumatic mitral (valve) insufficiency: Secondary | ICD-10-CM | POA: Diagnosis not present

## 2020-06-19 DIAGNOSIS — Z452 Encounter for adjustment and management of vascular access device: Secondary | ICD-10-CM | POA: Diagnosis not present

## 2020-06-19 DIAGNOSIS — E119 Type 2 diabetes mellitus without complications: Secondary | ICD-10-CM | POA: Diagnosis not present

## 2020-06-20 ENCOUNTER — Telehealth (HOSPITAL_COMMUNITY): Payer: Self-pay | Admitting: Licensed Clinical Social Worker

## 2020-06-20 NOTE — Telephone Encounter (Signed)
Advanced Heart Failure Patient Advocate Encounter  Prior Authorization for Marcelline Deist has been approved.    PA# [id number] Effective dates: 06/13/20 through 06/08/21  Archer Asa, CPhT

## 2020-06-20 NOTE — Telephone Encounter (Signed)
CSW received call from [facility] pharmacist informing us that they needed a new prescription for pt as it has expired and she needs a new bag tomorrow.  CSW able to locate and have signed by MD with help from clinic staff and faxed to [facility]- fax confirmation received and CSW called back pharmacist to inform  Will continue to follow and assist as needed  Burna Sis, LCSW Clinical Social Worker Advanced Heart Failure Clinic Desk#: [phone] Cell#: [phone]

## 2020-06-21 DIAGNOSIS — J45909 Unspecified asthma, uncomplicated: Secondary | ICD-10-CM | POA: Diagnosis not present

## 2020-06-21 DIAGNOSIS — E119 Type 2 diabetes mellitus without complications: Secondary | ICD-10-CM | POA: Diagnosis not present

## 2020-06-21 DIAGNOSIS — Z452 Encounter for adjustment and management of vascular access device: Secondary | ICD-10-CM | POA: Diagnosis not present

## 2020-06-21 DIAGNOSIS — Z6841 Body Mass Index (BMI) 40.0 and over, adult: Secondary | ICD-10-CM | POA: Diagnosis not present

## 2020-06-21 DIAGNOSIS — Z7984 Long term (current) use of oral hypoglycemic drugs: Secondary | ICD-10-CM | POA: Diagnosis not present

## 2020-06-21 DIAGNOSIS — O903 Peripartum cardiomyopathy: Secondary | ICD-10-CM | POA: Diagnosis not present

## 2020-06-21 DIAGNOSIS — I340 Nonrheumatic mitral (valve) insufficiency: Secondary | ICD-10-CM | POA: Diagnosis not present

## 2020-06-21 DIAGNOSIS — I5023 Acute on chronic systolic (congestive) heart failure: Secondary | ICD-10-CM | POA: Diagnosis not present

## 2020-06-21 DIAGNOSIS — F17210 Nicotine dependence, cigarettes, uncomplicated: Secondary | ICD-10-CM | POA: Diagnosis not present

## 2020-06-26 ENCOUNTER — Other Ambulatory Visit (HOSPITAL_COMMUNITY): Payer: Self-pay | Admitting: Cardiology

## 2020-06-27 DIAGNOSIS — F17210 Nicotine dependence, cigarettes, uncomplicated: Secondary | ICD-10-CM | POA: Diagnosis not present

## 2020-06-27 DIAGNOSIS — I340 Nonrheumatic mitral (valve) insufficiency: Secondary | ICD-10-CM | POA: Diagnosis not present

## 2020-06-27 DIAGNOSIS — Z7984 Long term (current) use of oral hypoglycemic drugs: Secondary | ICD-10-CM | POA: Diagnosis not present

## 2020-06-27 DIAGNOSIS — J45909 Unspecified asthma, uncomplicated: Secondary | ICD-10-CM | POA: Diagnosis not present

## 2020-06-27 DIAGNOSIS — E119 Type 2 diabetes mellitus without complications: Secondary | ICD-10-CM | POA: Diagnosis not present

## 2020-06-27 DIAGNOSIS — Z452 Encounter for adjustment and management of vascular access device: Secondary | ICD-10-CM | POA: Diagnosis not present

## 2020-06-27 DIAGNOSIS — O903 Peripartum cardiomyopathy: Secondary | ICD-10-CM | POA: Diagnosis not present

## 2020-06-27 DIAGNOSIS — Z6841 Body Mass Index (BMI) 40.0 and over, adult: Secondary | ICD-10-CM | POA: Diagnosis not present

## 2020-06-27 DIAGNOSIS — I5023 Acute on chronic systolic (congestive) heart failure: Secondary | ICD-10-CM | POA: Diagnosis not present

## 2020-06-28 DIAGNOSIS — J45909 Unspecified asthma, uncomplicated: Secondary | ICD-10-CM | POA: Diagnosis not present

## 2020-06-28 DIAGNOSIS — Z7984 Long term (current) use of oral hypoglycemic drugs: Secondary | ICD-10-CM | POA: Diagnosis not present

## 2020-06-28 DIAGNOSIS — Z452 Encounter for adjustment and management of vascular access device: Secondary | ICD-10-CM | POA: Diagnosis not present

## 2020-06-28 DIAGNOSIS — F331 Major depressive disorder, recurrent, moderate: Secondary | ICD-10-CM | POA: Diagnosis not present

## 2020-06-28 DIAGNOSIS — O903 Peripartum cardiomyopathy: Secondary | ICD-10-CM | POA: Diagnosis not present

## 2020-06-28 DIAGNOSIS — F17210 Nicotine dependence, cigarettes, uncomplicated: Secondary | ICD-10-CM | POA: Diagnosis not present

## 2020-06-28 DIAGNOSIS — E119 Type 2 diabetes mellitus without complications: Secondary | ICD-10-CM | POA: Diagnosis not present

## 2020-06-28 DIAGNOSIS — I5023 Acute on chronic systolic (congestive) heart failure: Secondary | ICD-10-CM | POA: Diagnosis not present

## 2020-06-28 DIAGNOSIS — I340 Nonrheumatic mitral (valve) insufficiency: Secondary | ICD-10-CM | POA: Diagnosis not present

## 2020-06-28 DIAGNOSIS — Z6841 Body Mass Index (BMI) 40.0 and over, adult: Secondary | ICD-10-CM | POA: Diagnosis not present

## 2020-07-03 ENCOUNTER — Other Ambulatory Visit (HOSPITAL_COMMUNITY): Payer: Self-pay

## 2020-07-03 NOTE — Progress Notes (Signed)
Paramedicine Encounter    [name], female    DOB: November 20, 1980, 39 y.o.   MRN: [id number]   Patient Care Team: Patient, No Pcp Per as PCP - General (General Practice) Regan Lemming, MD as PCP - Electrophysiology (Cardiology) Laurey Morale, MD as PCP - Advanced Heart Failure (Cardiology)  Patient Active Problem List   Diagnosis Date Noted  . Acute on chronic systolic CHF (congestive heart failure) (HCC) 06/17/2019  . Obstructive sleep apnea 06/17/2019  . 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 Diabetes mellitus (HCC) 06/17/2019  . Amenorrhea 08/03/2018  . Congestive heart failure (HCC) 06/29/2018  . Hypotension 06/07/2018  . Rhinovirus infection 06/06/2018  . Asthma 06/05/2018  . Acute respiratory failure with hypoxia (HCC) 06/05/2018  . Hypokalemia 06/05/2018  . Snoring 09/23/2016  . Cough 04/10/2016  . 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 Tobacco abuse 01/25/2016  . Acute on chronic combined systolic and diastolic CHF (congestive heart failure) (HCC) 01/25/2016  . Mitral regurgitation   . Abnormal EKG-inf TWI 01/15/2016  . Positive D dimer-CTA neg for PE 01/15/2016  . Morbid obesity- BMI 57 01/14/2016  . Abdominal pain 01/14/2016  . 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 At risk for sleep apnea 01/14/2016  . Cardiomyopathy (HCC) 11/01/2015  . Morbid obesity with BMI of 50.0-59.9, adult (HCC) 08/16/2015    Current Outpatient Medications:  .  atorvastatin (LIPITOR) 10 MG tablet, Take 1 tablet (10 mg total) by mouth daily. (Patient taking differently: Take 10 mg by mouth daily. ), Disp: 90 tablet, Rfl: 3 .  carvedilol (COREG) 12.5 MG tablet, Take 1 tablet (12.5 mg total) by mouth 2 (two) times daily. Dose increase, Disp: 180 tablet, Rfl: 2 .  digoxin (LANOXIN) 0.125 MG tablet, Take 1 tablet (0.125 mg total) by mouth daily. (Patient taking differently: Take 0.125 mg by mouth daily. ), Disp: 90 tablet, Rfl:  3 .  ivabradine (CORLANOR) 7.5 MG TABS tablet, Take 1 tablet (7.5 mg total) by mouth 2 (two) times daily with a meal., Disp: 60 tablet, Rfl: 3 .  Magnesium Oxide 400 (240 Mg) MG TABS, Take 2 tablets by mouth every morning and 1 tablet by mouth every evening, Disp: 90 tablet, Rfl: 6 .  metolazone (ZAROXOLYN) 2.5 MG tablet, TAKE ONLY AS DIRECTED BY THE CHF CLINIC, Disp: 10 tablet, Rfl: 3 .  milrinone (PRIMACOR) 20 MG/100 ML SOLN infusion, Inject 0.0414 mg/min into the vein continuous., Disp:  , Rfl:  .  potassium chloride (KLOR-CON) 10 MEQ tablet, Take 80 meq (8 tabs) in the morning, 60 meq (6 tabs) in the afternoon, and 60 meq (6 tabs) in the evening (Patient taking differently: Take 80 meq (8 tabs) in the morning, 60 meq (6 tabs) in the afternoon, and 60 meq (6 tabs) in the evening), Disp: 600 tablet, Rfl: 5 .  sacubitril-valsartan (ENTRESTO) 49-51 MG, Take 1 tablet by mouth 2 (two) times daily., Disp: 60 tablet, Rfl: 3 .  spironolactone (ALDACTONE) 25 MG tablet, Take 1 tablet (25 mg total) by mouth daily. (Patient taking differently: Take 25 mg by mouth daily. ), Disp: 90 tablet, Rfl: 3 .  torsemide (DEMADEX) 20 MG tablet, Take 5 tablets (100 mg total) by mouth in the morning AND 4 tablets (80 mg total) every evening., Disp: 720 tablet, Rfl: 3 .  albuterol (PROAIR HFA) 108 (90 Base) MCG/ACT inhaler, Inhale 2 puffs into the lungs every 6 (six) hours as needed for wheezing or shortness of breath.  (Patient not taking:  Reported on 07/03/2020), Disp: , Rfl:  .  dapagliflozin propanediol (FARXIGA) 10 MG TABS tablet, Take 1 tablet (10 mg total) by mouth daily before breakfast. (Patient not taking: Reported on 07/03/2020), Disp: 30 tablet, Rfl: 11 .  ipratropium-albuterol (DUONEB) 0.5-2.5 (3) MG/3ML SOLN, Take 3 mLs by nebulization every 4 (four) hours as needed. (Patient not taking: Reported on 03/15/2020), Disp: 360 mL, Rfl: 0 .  potassium chloride (KLOR-CON) 20 MEQ packet, Take by mouth.  (Patient not  taking: Reported on 07/03/2020), Disp: , Rfl:  .  PRESCRIPTION MEDICATION, CPAP- AT BEDTIME (Patient not taking: Reported on 07/03/2020), Disp: , Rfl:  Allergies  Allergen Reactions  . Metformin Diarrhea    And caused severe "dry mouth," also  . Metformin And Related Diarrhea    And caused severe "dry mouth," also      Social History   Socioeconomic History  . Marital status: Single    Spouse name: Not on file  . Number of children: 2  . Years of education: Not on file  . Highest education level: Not on file  Occupational History  . Not on file  Tobacco Use  . Smoking status: Former Smoker    Quit date: 10/19/2015    Years since quitting: 4.7  . Smokeless tobacco: Never Used  Vaping Use  . Vaping Use: Never assessed  Substance and Sexual Activity  . Alcohol use: Yes    Alcohol/week: 0.0 standard drinks    Comment: occasionally  . Drug use: Yes    Types: Marijuana    Comment: only socially  . Sexual activity: Yes  Other Topics Concern  . Not on file  Social History Narrative  . Not on file   Social Determinants of Health   Financial Resource Strain: High Risk  . Difficulty of Paying Living Expenses: Hard  Food Insecurity: Food Insecurity Present  . Worried About Programme researcher, broadcasting/film/video in the Last Year: Sometimes true  . Ran Out of Food in the Last Year: Sometimes true  Transportation Needs: No Transportation Needs  . Lack of Transportation (Medical): No  . Lack of Transportation (Non-Medical): No  Physical Activity:   . Days of Exercise per Week: Not on file  . Minutes of Exercise per Session: Not on file  Stress:   . Feeling of Stress : Not on file  Social Connections:   . Frequency of Communication with Friends and Family: Not on file  . Frequency of Social Gatherings with Friends and Family: Not on file  . Attends Religious Services: Not on file  . Active Member of Clubs or Organizations: Not on file  . Attends Banker Meetings: Not on file  .  Marital Status: Not on file  Intimate Partner Violence:   . Fear of Current or Ex-Partner: Not on file  . Emotionally Abused: Not on file  . Physically Abused: Not on file  . Sexually Abused: Not on file    Physical Exam      Future Appointments  Date Time Provider Department Center  07/06/2020 11:30 AM [facility] PA/NP [facility] None  07/24/2020  7:45 AM [facility] DEVICE REMOTES [facility]  08/24/2020  8:50 AM Marcine Matar, MD [facility] None    BP (!) 94/0   Pulse 90   Resp 18   SpO2 97%  Weight today-353 Weight yesterday-? Last visit weight-353  Pt reports she is doing well. She denies increased sob.  She denies c/p. No dizziness.  She has not gotten the [place]  p/u yet. She lost the 30 day card.  She has not been using her CPAP. She thinks it has been recalled. Its a res-med brand.  She did have a fall on Saturday evening-she isnt sure if she just tripped over something in the store or if she got light headed b/c she moved around real fast but she went down to one knee, no fall to hit her head or got any injuries from that.  She denies missing any meds.  Will ask amy to see if she still needs to come in on Thursday since she has not started the farxiga yet.  Appetite is poor.  She wants to move to bubble packs. So will get that set up for her.  meds verified and pill box refilled.      Kerry Hough, EMT-Paramedic [phone] [facility] Paramedic  07/03/20

## 2020-07-05 DIAGNOSIS — Z6841 Body Mass Index (BMI) 40.0 and over, adult: Secondary | ICD-10-CM | POA: Diagnosis not present

## 2020-07-05 DIAGNOSIS — J45909 Unspecified asthma, uncomplicated: Secondary | ICD-10-CM | POA: Diagnosis not present

## 2020-07-05 DIAGNOSIS — F17210 Nicotine dependence, cigarettes, uncomplicated: Secondary | ICD-10-CM | POA: Diagnosis not present

## 2020-07-05 DIAGNOSIS — O903 Peripartum cardiomyopathy: Secondary | ICD-10-CM | POA: Diagnosis not present

## 2020-07-05 DIAGNOSIS — Z7984 Long term (current) use of oral hypoglycemic drugs: Secondary | ICD-10-CM | POA: Diagnosis not present

## 2020-07-05 DIAGNOSIS — F331 Major depressive disorder, recurrent, moderate: Secondary | ICD-10-CM | POA: Diagnosis not present

## 2020-07-05 DIAGNOSIS — I340 Nonrheumatic mitral (valve) insufficiency: Secondary | ICD-10-CM | POA: Diagnosis not present

## 2020-07-05 DIAGNOSIS — E119 Type 2 diabetes mellitus without complications: Secondary | ICD-10-CM | POA: Diagnosis not present

## 2020-07-05 DIAGNOSIS — Z452 Encounter for adjustment and management of vascular access device: Secondary | ICD-10-CM | POA: Diagnosis not present

## 2020-07-05 DIAGNOSIS — I5023 Acute on chronic systolic (congestive) heart failure: Secondary | ICD-10-CM | POA: Diagnosis not present

## 2020-07-06 ENCOUNTER — Encounter (HOSPITAL_COMMUNITY): Payer: Self-pay

## 2020-07-06 ENCOUNTER — Ambulatory Visit (HOSPITAL_COMMUNITY)
Admission: RE | Admit: 2020-07-06 | Discharge: 2020-07-06 | Disposition: A | Discharge status: Home / Self Care | Payer: Medicaid Other | Source: Ambulatory Visit | Attending: Adult Health | Admitting: Adult Health

## 2020-07-06 ENCOUNTER — Other Ambulatory Visit: Payer: Self-pay

## 2020-07-06 VITALS — BP 132/74 | HR 95 | Wt 364.0 lb

## 2020-07-06 DIAGNOSIS — Z8249 Family history of ischemic heart disease and other diseases of the circulatory system: Secondary | ICD-10-CM | POA: Diagnosis not present

## 2020-07-06 DIAGNOSIS — Z888 Allergy status to other drugs, medicaments and biological substances status: Secondary | ICD-10-CM | POA: Diagnosis not present

## 2020-07-06 DIAGNOSIS — I428 Other cardiomyopathies: Secondary | ICD-10-CM | POA: Insufficient documentation

## 2020-07-06 DIAGNOSIS — Z95810 Presence of automatic (implantable) cardiac defibrillator: Secondary | ICD-10-CM

## 2020-07-06 DIAGNOSIS — I5022 Chronic systolic (congestive) heart failure: Secondary | ICD-10-CM | POA: Insufficient documentation

## 2020-07-06 DIAGNOSIS — Z833 Family history of diabetes mellitus: Secondary | ICD-10-CM | POA: Diagnosis not present

## 2020-07-06 DIAGNOSIS — I5042 Chronic combined systolic (congestive) and diastolic (congestive) heart failure: Secondary | ICD-10-CM | POA: Diagnosis not present

## 2020-07-06 DIAGNOSIS — E119 Type 2 diabetes mellitus without complications: Secondary | ICD-10-CM | POA: Insufficient documentation

## 2020-07-06 DIAGNOSIS — G4730 Sleep apnea, unspecified: Secondary | ICD-10-CM | POA: Diagnosis not present

## 2020-07-06 DIAGNOSIS — G4733 Obstructive sleep apnea (adult) (pediatric): Secondary | ICD-10-CM | POA: Insufficient documentation

## 2020-07-06 DIAGNOSIS — I471 Supraventricular tachycardia: Secondary | ICD-10-CM | POA: Diagnosis not present

## 2020-07-06 DIAGNOSIS — Z87891 Personal history of nicotine dependence: Secondary | ICD-10-CM | POA: Insufficient documentation

## 2020-07-06 DIAGNOSIS — Z6841 Body Mass Index (BMI) 40.0 and over, adult: Secondary | ICD-10-CM | POA: Insufficient documentation

## 2020-07-06 MED ORDER — TORSEMIDE 20 MG PO TABS
100.0000 mg | ORAL_TABLET | Freq: Two times a day (BID) | ORAL | 3 refills | Status: DC
Start: 2020-07-06 — End: 2020-08-01

## 2020-07-06 MED ORDER — METOLAZONE 2.5 MG PO TABS: ORAL_TABLET | ORAL | 3 refills | Status: DC

## 2020-07-06 NOTE — Patient Instructions (Addendum)
INCREASE Torsemide to 100 mg (5 tabs) twice a day  TAKE Metolazone 2.5 mg 07/06/20 and 07/07/20, then resume normal dose of as needed/as directed by HF clinic  Your physician recommends that you schedule a follow-up appointment in: 1 week   If you have any questions or concerns before your next appointment please send [place] a message through [place] or call our office at [phone].    TO LEAVE A MESSAGE FOR THE NURSE SELECT OPTION 2, PLEASE LEAVE A MESSAGE INCLUDING: . YOUR NAME . DATE OF BIRTH . CALL BACK NUMBER . REASON FOR CALL**this is important as we prioritize the call backs  YOU WILL RECEIVE A CALL BACK THE SAME DAY AS LONG AS YOU CALL BEFORE 4:00 PM

## 2020-07-06 NOTE — Progress Notes (Signed)
Advanced Heart Failure Clinic Note   Referring Physician:PCP: Patient, No Pcp Per PCP-Cardiologist: Dr. 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   She presented to the ER on 06/17/19 with several days of worsening HF with SOB at rest, marked edema and orthopnea/PND, not responding to increased doses of oral diuretics. She admitted to high levels of water and Gatorade intake. Her admit wt was 380 lb (Dry wt ~360 lb). She was started on IV Lasix but had initial poor urinary response. PO metolazone added as adjunct. Echo showed EF <20%, relatively normal RV, moderate MR. RHC 06/21/19 with low output (CI 1.8) and elevated PCWP but near normal RA pressure. She was started on inotropic therapy w/ milrinone for low output, 0.25 mcg/kg/min. Also treated w/ Entresto, Coreg, spironolactone, digoxin and ivabradine. She was later transitioned off of IV Lasix to po torsemide 80 mg qam/60 mg qpm.  Unfortunately, she was not a transplant candidate due to her morbid obesity. We discussed her at [facility] with Dr. Donata Clay. She will need significant weight loss to get to LVAD. Will use home milrinone to try to facilitate increased activity to work towards weight loss. She understands that milrinone is not a good end-point and that we would like to use it as a bridge to eventual LVAD. RV function seems adequate for LVAD. Tunneled catheter was placed for home milrinone and home health was arranged. She was discharged home on 06/24/20 on milrinone 0.25 mcg/kg/min.   06/27/19 she presented to the [facility] w/ CC of palpitations. Was brought in by EMS and was reportedly in SVT with HR in the 180s. She was given adenosine and converted to NSR prior to arrival to the ED.  PICC exchanged 10/15/19  Today she returns for HF follow up. Remains on home milrinone. Overall feeling fair. Says she is picking up fluid. Yesterday she didn't take her  scheduled metolazone and says she misses her afternoon torsemide a few days a week. Uses an electric cart in the grocery store. SOB with exertion. + orthopnea. Denies PND. Using CPAP some nights. Appetite ok. No fever or chills. Weight at home trending up 349-->360  pounds. Followed by HF Paramedicine.   Labs (8/21): K 4.4, creatinine 0.9, hgb 14.4  PMH:  1. Chronic systolic CHF: Nonischemic cardiomyopathy.  No family history of cardiomyopathy, no heavy ETOH, cocaine, or amphetamines.  Medtronic ICD.  - Echo (12/16) with EF 20-25%, moderate MR.  - Echo (5/17) with severely dilated LV, EF 10-15%, severe functional MR, mildly decreased RV systolic function. - LHC/RHC (4/31): No significant coronary disease; mean RA 15, PA 61/25 mean 41, mean PCWP 18, CI 1.7 (Fick), PVR 5.4 WU.  - Echo (10/17): EF 20%, severely dilated LV, normal RV size with mildly decreased systolic function, moderate-severe central MR.  - Echo 04/13/17  LVEF 15-20%, Severe LV dilation, Mod LAE, Mild RAE - CPX (5/19): RER 0.95, peak VO2 9.8, VE/VCO2 26 => submaximal but likely severe functional impairement.  - Echo (7/19): EF 20-25%, moderate LV dilation, RV normal size with mildly decreased systolic function, moderate central MR.  - Echo (10/20): EF < 20%, severe LV enlargement, normal RV>  - RHC (10/20): mean RA 7, PA 65/25, mean PCWP 27, CI 1.8, PVR 3.87, PAPI 5.7.  2. Morbid obesity.  3. 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  Echo (10/17) with moderate-severe MR. Echo (7/19) with moderate central MR.  4. OSA: CPAP.  5. SVT  Current Outpatient Medications  Medication Sig Dispense Refill  . albuterol (PROAIR HFA) 108 (90 Base) MCG/ACT inhaler Inhale 2 puffs into the lungs every 6 (six) hours as needed for wheezing or shortness of breath.     Marland Kitchen atorvastatin (LIPITOR) 10 MG tablet Take 1 tablet (10 mg total) by mouth daily. (Patient taking differently: Take 10 mg by mouth daily. ) 90 tablet 3  . carvedilol  (COREG) 12.5 MG tablet Take 1 tablet (12.5 mg total) by mouth 2 (two) times daily. Dose increase 180 tablet 2  . dapagliflozin propanediol (FARXIGA) 10 MG TABS tablet Take 1 tablet (10 mg total) by mouth daily before breakfast. 30 tablet 11  . digoxin (LANOXIN) 0.125 MG tablet Take 1 tablet (0.125 mg total) by mouth daily. (Patient taking differently: Take 0.125 mg by mouth daily. ) 90 tablet 3  . ipratropium-albuterol (DUONEB) 0.5-2.5 (3) MG/3ML SOLN Take 3 mLs by nebulization every 4 (four) hours as needed. 360 mL 0  . ivabradine (CORLANOR) 7.5 MG TABS tablet Take 1 tablet (7.5 mg total) by mouth 2 (two) times daily with a meal. 60 tablet 3  . Magnesium Oxide 400 (240 Mg) MG TABS Take 2 tablets by mouth every morning and 1 tablet by mouth every evening 90 tablet 6  . metolazone (ZAROXOLYN) 2.5 MG tablet TAKE ONLY AS DIRECTED BY THE CHF CLINIC 10 tablet 3  . milrinone (PRIMACOR) 20 MG/100 ML SOLN infusion Inject 0.0414 mg/min into the vein continuous.    . potassium chloride (KLOR-CON) 10 MEQ tablet Take 80 meq (8 tabs) in the morning, 60 meq (6 tabs) in the afternoon, and 60 meq (6 tabs) in the evening (Patient taking differently: Take 80 meq (8 tabs) in the morning, 60 meq (6 tabs) in the afternoon, and 60 meq (6 tabs) in the evening) 600 tablet 5  . potassium chloride (KLOR-CON) 20 MEQ packet Take by mouth.     Marland Kitchen PRESCRIPTION MEDICATION CPAP- AT BEDTIME    . sacubitril-valsartan (ENTRESTO) 49-51 MG Take 1 tablet by mouth 2 (two) times daily. 60 tablet 3  . spironolactone (ALDACTONE) 25 MG tablet Take 1 tablet (25 mg total) by mouth daily. (Patient taking differently: Take 25 mg by mouth daily. ) 90 tablet 3  . torsemide (DEMADEX) 20 MG tablet Take 5 tablets (100 mg total) by mouth 2 (two) times daily. 900 tablet 3   No current facility-administered medications for this encounter.    Allergies  Allergen Reactions  . Metformin Diarrhea    And caused severe "dry mouth," also  . Metformin And  Related Diarrhea    And caused severe "dry mouth," also      Social History   Socioeconomic History  . Marital status: Single    Spouse name: Not on file  . Number of children: 2  . Years of education: Not on file  . Highest education level: Not on file  Occupational History  . Not on file  Tobacco Use  . Smoking status: Former Smoker    Quit date: 10/19/2015    Years since quitting: 4.7  . Smokeless tobacco: Never Used  Vaping Use  . Vaping Use: Never assessed  Substance and Sexual Activity  . Alcohol use: Yes    Alcohol/week: 0.0 standard drinks    Comment: occasionally  . Drug use: Yes    Types: Marijuana    Comment: only socially  . Sexual activity: Yes  Other Topics Concern  . Not  on file  Social History Narrative  . Not on file   Social Determinants of Health   Financial Resource Strain: High Risk  . Difficulty of Paying Living Expenses: Hard  Food Insecurity: Food Insecurity Present  . Worried About Programme researcher, broadcasting/film/video in the Last Year: Sometimes true  . Ran Out of Food in the Last Year: Sometimes true  Transportation Needs: No Transportation Needs  . Lack of Transportation (Medical): No  . Lack of Transportation (Non-Medical): No  Physical Activity:   . Days of Exercise per Week: Not on file  . Minutes of Exercise per Session: Not on file  Stress:   . Feeling of Stress : Not on file  Social Connections:   . Frequency of Communication with Friends and Family: Not on file  . Frequency of Social Gatherings with Friends and Family: Not on file  . Attends Religious Services: Not on file  . Active Member of Clubs or Organizations: Not on file  . Attends Banker Meetings: Not on file  . Marital Status: Not on file  Intimate Partner Violence:   . Fear of Current or Ex-Partner: Not on file  . Emotionally Abused: Not on file  . Physically Abused: Not on file  . Sexually Abused: Not on file      Family History  Problem Relation Age of Onset   . Heart attack Mother   . Asthma Mother   . Heart failure Mother   . Hypertension Father   . Diabetes Brother     Vitals:   07/06/20 1123  BP: 132/74  Pulse: 95  SpO2: 93%  Weight: (!) 165.1 kg (364 lb)   Wt Readings from Last 3 Encounters:  07/06/20 (!) 165.1 kg (364 lb)  06/13/20 (!) 160.1 kg (353 lb)  05/17/20 (!) 161.5 kg (356 lb)  General:  Apears fatigued. SOB walking in the clinic.  HEENT: normal Neck: supple. JVP 11-12  Carotids 2+ bilat; no bruits. No lymphadenopathy or thryomegaly appreciated. Cor: PMI nondisplaced. Regular rate & rhythm. No rubs, gallops or murmurs. R upper chest tunneled picc site ok.  Lungs: Crackles in the bases.  Abdomen: obese, soft, nontender, nondistended. No hepatosplenomegaly. No bruits or masses. Good bowel sounds. Extremities: no cyanosis, clubbing, rash, R and LLE 1+ edema Neuro: alert & orientedx3, cranial nerves grossly intact. moves all 4 extremities w/o difficulty. Affect pleasant   ASSESSMENT & PLAN: 1. Chronic systolic CHF:  Nonischemic cardiomyopathy.  Body habitus precludes cardiac MRI.  Echo 06/2019 with EF < 20%, relatively normal RV, moderate MR.  RHC 06/20/20 with low output (CI 1.8) and elevated PCWP but near normal RA pressure, started on milrinone 0.25 with plans to continue home milrinone as bridge to possible LVAD if she could achieve significant weight loss.  Unfortunately, she has not succeeded in losing any weight.  - NYHA III. On chronic milrinone.  -Optivol- impedance down and fluid index trending up.  - On exam she is volume overloaded but no surprise given she has missed some of her diuretics.  -  Increase torsemide to 100 mg twice a day and she was asked to take metolazone 2.5 mg for the next 2 days.  - Continue Coreg 12.5 mg bid.   - Continue Entresto to 49/51 bid  - Continue ivabradine to 7.5 mg twice a day.   - Continue spironolactone 25 daily.  - Continue digoxin 0.125 mg, check level today.  - Hold off on  farxiga and consider next visit.    -  VAD consideration: Her size and social support will be issues.  She would not be a transplant candidate at this point due to weight. We discussed her at [facility], Dr. Donata Clay has seen.  She will need significant weight loss to get to LVAD.  Have been using home milrinone to try to facilitate increased activity to work towards weight loss.  She understands that milrinone is not a good end-point and that we would like to use it as a bridge to eventual LVAD. RV function seems adequate for LVAD.  However, she has made no progress towards weight loss.  2. Morbid obesity:  Body mass index is 60.57 kg/m. - Will refer her to [facility] for bariatric surgery consideration. They have contacted her but she has not called them back.  3. SVT: had ED visit 10/11 for SVT noted by EMS that converted back to NSR after dose of adenosine. This was in the setting of hypokalemia and hypomagnesemia, at 3.0 and 1.5 respectively. Also in the setting of inotropic therapy w/ milrinone.   4. OSA: Needs to continue CPAP nightly.   5. DMII: Consider farxiga next visit.   Today she was offered IV lasix but she declined. For this reason, oral diuretics increased. I stressed the importance of medication compliance.   Follow up next week to reassess volume status.     Tonye Becket, NP 07/06/20

## 2020-07-10 ENCOUNTER — Other Ambulatory Visit (HOSPITAL_COMMUNITY): Payer: Self-pay

## 2020-07-10 NOTE — Progress Notes (Signed)
Paramedicine Encounter    [name], female    DOB: [birth year], 39 y.o.   MRN: [id number]   Patient Care Team: Patient, No Pcp Per as PCP - General (General Practice) Constance Haw, MD as PCP - Electrophysiology (Cardiology) Larey Dresser, MD as PCP - Advanced Heart Failure (Cardiology)  Patient Active Problem List   Diagnosis Date Noted   Acute on chronic systolic CHF (congestive heart failure) ([place]) 06/17/2019   Obstructive sleep apnea 06/17/2019   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   Diabetes mellitus ([place]) 06/17/2019   Amenorrhea 08/03/2018   Congestive heart failure ([place]) 06/29/2018   Hypotension 06/07/2018   Rhinovirus infection 06/06/2018   Asthma 06/05/2018   Acute respiratory failure with hypoxia ([place]) 06/05/2018   Hypokalemia 06/05/2018   Snoring 09/23/2016   Cough 04/10/2016   NICM (nonischemic cardiomyopathy) ([place]) 01/25/2016   Tobacco abuse 01/25/2016   Acute on chronic combined systolic and diastolic CHF (congestive heart failure) ([place]) 01/25/2016   Mitral regurgitation    Abnormal EKG-inf TWI 01/15/2016   Positive D dimer-CTA neg for PE 01/15/2016   Morbid obesity- BMI 57 01/14/2016   Abdominal pain 01/14/2016   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 (Patient taking differently: Take 10 mg by mouth daily. ), Disp: 90 tablet, Rfl: 3   carvedilol (COREG) 12.5 MG tablet, Take 1 tablet (12.5 mg total) by mouth 2 (two) times daily. Dose increase, Disp: 180 tablet, Rfl: 2   digoxin (LANOXIN) 0.125 MG tablet, Take 1 tablet (0.125 mg total) by mouth daily. (Patient taking differently: Take 0.125 mg by mouth daily. ), Disp: 90 tablet, Rfl:  3   ivabradine (CORLANOR) 7.5 MG TABS tablet, Take 1 tablet (7.5 mg total) by mouth 2 (two) times daily with a meal., Disp: 60 tablet, Rfl: 3   Magnesium Oxide 400 (240 Mg) MG TABS, Take 2 tablets by mouth every morning and 1 tablet by mouth every evening, Disp: 90 tablet, Rfl: 6   metolazone (ZAROXOLYN) 2.5 MG tablet, As directed by HF clinic, Disp: 10 tablet, Rfl: 3   milrinone (PRIMACOR) 20 MG/100 ML SOLN infusion, Inject 0.0414 mg/min into the vein continuous., Disp:  , Rfl:    potassium chloride (KLOR-CON) 10 MEQ tablet, Take 80 meq (8 tabs) in the morning, 60 meq (6 tabs) in the afternoon, and 60 meq (6 tabs) in the evening (Patient taking differently: Take 80 meq (8 tabs) in the morning, 60 meq (6 tabs) in the afternoon, and 60 meq (6 tabs) in the evening), Disp: 600 tablet, Rfl: 5   sacubitril-valsartan (ENTRESTO) 49-51 MG, Take 1 tablet by mouth 2 (two) times daily., Disp: 60 tablet, Rfl: 3   spironolactone (ALDACTONE) 25 MG tablet, Take 1 tablet (25 mg total) by mouth daily. (Patient taking differently: Take 25 mg by mouth daily. ), Disp: 90 tablet, Rfl: 3   torsemide (DEMADEX) 20 MG tablet, Take 5 tablets (100 mg total) by mouth 2 (two) times daily., Disp: 900 tablet, Rfl: 3   albuterol (PROAIR HFA) 108 (90 Base) MCG/ACT inhaler, Inhale 2 puffs into the lungs every 6 (six) hours as needed for wheezing or shortness of breath.  (Patient not taking: Reported on 07/10/2020), Disp: , Rfl:    dapagliflozin  propanediol (FARXIGA) 10 MG TABS tablet, Take 1 tablet (10 mg total) by mouth daily before breakfast. (Patient not taking: Reported on 07/10/2020), Disp: 30 tablet, Rfl: 11   ipratropium-albuterol (DUONEB) 0.5-2.5 (3) MG/3ML SOLN, Take 3 mLs by nebulization every 4 (four) hours as needed. (Patient not taking: Reported on 07/10/2020), Disp: 360 mL, Rfl: 0   potassium chloride (KLOR-CON) 20 MEQ packet, Take by mouth.  (Patient not taking: Reported on 07/10/2020), Disp: , Rfl:     PRESCRIPTION MEDICATION, CPAP- AT BEDTIME (Patient not taking: Reported on 07/10/2020), Disp: , Rfl:  Allergies  Allergen Reactions   Metformin Diarrhea    And caused severe "dry mouth," also   Metformin And Related Diarrhea    And caused severe "dry mouth," also      Social History   Socioeconomic History   Marital status: Single    Spouse name: Not on file   Number of children: 2   Years of education: Not on file   Highest education level: Not on file  Occupational History   Not on file  Tobacco Use   Smoking status: Former Smoker    Quit date: 10/19/2015    Years since quitting: 4.7   Smokeless tobacco: Never Used  Vaping Use   Vaping Use: Never assessed  Substance and Sexual Activity   Alcohol use: Yes    Alcohol/week: 0.0 standard drinks    Comment: occasionally   Drug use: Yes    Types: Marijuana    Comment: only socially   Sexual activity: Yes  Other Topics Concern   Not on file  Social History Narrative   Not on file   Social Determinants of Health   Financial Resource Strain: High Risk   Difficulty of Paying Living Expenses: Hard  Food Insecurity: Food Insecurity Present   Worried About Running Out of Food in the Last Year: Sometimes true   Ran Out of Food in the Last Year: Sometimes true  Transportation Needs: No Transportation Needs   Lack of Transportation (Medical): No   Lack of Transportation (Non-Medical): No  Physical Activity:    Days of Exercise per Week: Not on file   Minutes of Exercise per Session: Not on file  Stress:    Feeling of Stress : Not on file  Social Connections:    Frequency of Communication with Friends and Family: Not on file   Frequency of Social Gatherings with Friends and Family: Not on file   Attends Religious Services: Not on file   Active Member of Clubs or Organizations: Not on file   Attends Archivist Meetings: Not on file   Marital Status: Not on file  Intimate Partner  Violence:    Fear of Current or Ex-Partner: Not on file   Emotionally Abused: Not on file   Physically Abused: Not on file   Sexually Abused: Not on file    Physical Exam      Future Appointments  Date Time Provider [place]  07/13/2020 10:00 AM [facility] PA/NP [facility] None  07/24/2020  7:45 AM [facility] DEVICE REMOTES [facility]  08/24/2020  8:50 AM Ladell Pier, MD [facility] None    BP (!) 74/0    Pulse 80    Resp 20    SpO2 99%  Weight today-359 Weight yesterday-354 Last visit weight-364 @ clinic  Last weight @ home-353  Pt was seen at clinic last Thursday-her weight was up a lot and [name] wanted to do IV lasix but she refused.  She missed her torsemide dose and her metolazone dose.  She reports when she takes the 4 of the torsemide it hurts her flank area (kidneys) until she starts urinating and then with the 5 tablets it really hurts her.  She said she did the extra metolazone and increased her torsemide. She goes back to clinic this Thursday.  Last night she had crablegs with a bunch of seasonings on it. So that's probably why her weight is back up again.  She has not taking her meds yet this morning. She is taking them right now.  She feels like her short of breath has improved. She denies dizziness, no c/p.  She is thinking of becoming sexually active and was asking about birth control. She is now dating a guy she met on [employer]. I have asked her to be careful about dating guys online.  I told [facility] [address] [facility] she can go too since she no longer has a specific ob/gyn right now. Will ask jenna about this and making her an appointment.  Had a very hard time getting a b/p on her today.   [name], [facility] Paramedic  07/10/20

## 2020-07-11 ENCOUNTER — Other Ambulatory Visit (HOSPITAL_COMMUNITY): Payer: Self-pay

## 2020-07-12 DIAGNOSIS — I5023 Acute on chronic systolic (congestive) heart failure: Secondary | ICD-10-CM | POA: Diagnosis not present

## 2020-07-12 DIAGNOSIS — I340 Nonrheumatic mitral (valve) insufficiency: Secondary | ICD-10-CM | POA: Diagnosis not present

## 2020-07-12 DIAGNOSIS — F17210 Nicotine dependence, cigarettes, uncomplicated: Secondary | ICD-10-CM | POA: Diagnosis not present

## 2020-07-12 DIAGNOSIS — Z452 Encounter for adjustment and management of vascular access device: Secondary | ICD-10-CM | POA: Diagnosis not present

## 2020-07-12 DIAGNOSIS — E119 Type 2 diabetes mellitus without complications: Secondary | ICD-10-CM | POA: Diagnosis not present

## 2020-07-12 DIAGNOSIS — Z6841 Body Mass Index (BMI) 40.0 and over, adult: Secondary | ICD-10-CM | POA: Diagnosis not present

## 2020-07-12 DIAGNOSIS — O903 Peripartum cardiomyopathy: Secondary | ICD-10-CM | POA: Diagnosis not present

## 2020-07-12 DIAGNOSIS — J45909 Unspecified asthma, uncomplicated: Secondary | ICD-10-CM | POA: Diagnosis not present

## 2020-07-12 DIAGNOSIS — Z7984 Long term (current) use of oral hypoglycemic drugs: Secondary | ICD-10-CM | POA: Diagnosis not present

## 2020-07-12 NOTE — Progress Notes (Signed)
Telephone encounter-- Spoke to pt and she advised she tried calling [facility] for the referral but she was on hold for hour and half so she hung up.  I asked her to try again.  She also said her weight was down to 356, down some but still up.  Also gave her the info to [facility]yn appoint for birth control.   Kerry Hough, EMT-Paramedic  07/12/20

## 2020-07-13 ENCOUNTER — Encounter (HOSPITAL_COMMUNITY): Payer: Medicaid Other

## 2020-07-13 DIAGNOSIS — F331 Major depressive disorder, recurrent, moderate: Secondary | ICD-10-CM | POA: Diagnosis not present

## 2020-07-15 ENCOUNTER — Other Ambulatory Visit (HOSPITAL_COMMUNITY): Payer: Self-pay | Admitting: Cardiology

## 2020-07-17 ENCOUNTER — Other Ambulatory Visit (HOSPITAL_COMMUNITY): Payer: Self-pay

## 2020-07-17 ENCOUNTER — Telehealth (HOSPITAL_COMMUNITY): Payer: Self-pay | Admitting: Pharmacy Technician

## 2020-07-17 NOTE — Progress Notes (Signed)
Paramedicine Encounter    [name], female    DOB: [birth year], 39 y.o.   MRN: [id number]   Patient Care Team: Patient, No Pcp Per as PCP - General (General Practice) Regan Lemming, MD as PCP - Electrophysiology (Cardiology) Laurey Morale, MD as PCP - Advanced Heart Failure (Cardiology)  Patient Active Problem List   Diagnosis Date Noted   Acute on chronic systolic CHF (congestive heart failure) ([facility]) 06/17/2019   Obstructive sleep apnea 06/17/2019   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   Diabetes mellitus ([facility]) 06/17/2019   Amenorrhea 08/03/2018   Congestive heart failure ([facility]) 06/29/2018   Hypotension 06/07/2018   Rhinovirus infection 06/06/2018   Asthma 06/05/2018   Acute respiratory failure with hypoxia ([facility]) 06/05/2018   Hypokalemia 06/05/2018   Snoring 09/23/2016   Cough 04/10/2016   NICM (nonischemic cardiomyopathy) ([facility]) 01/25/2016   Tobacco abuse 01/25/2016   Acute on chronic combined systolic and diastolic CHF (congestive heart failure) ([facility]) 01/25/2016   Mitral regurgitation    Abnormal EKG-inf TWI 01/15/2016   Positive D dimer-CTA neg for PE 01/15/2016   Morbid obesity- BMI 57 01/14/2016   Abdominal pain 01/14/2016   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 (Patient taking differently: Take 10 mg by mouth daily. ), Disp: 90 tablet, Rfl: 3   carvedilol (COREG) 12.5 MG tablet, Take 1 tablet (12.5 mg total) by mouth 2 (two) times daily. Dose increase, Disp: 180 tablet, Rfl: 2   digoxin (LANOXIN) 0.125 MG tablet, Take 1 tablet (0.125 mg total) by mouth daily. (Patient taking differently: Take 0.125 mg by mouth daily. ), Disp: 90 tablet, Rfl:  3   ivabradine (CORLANOR) 7.5 MG TABS tablet, Take 1 tablet (7.5 mg total) by mouth 2 (two) times daily with a meal., Disp: 60 tablet, Rfl: 3   Magnesium Oxide 400 (240 Mg) MG TABS, Take 2 tablets by mouth every morning and 1 tablet by mouth every evening, Disp: 90 tablet, Rfl: 6   metolazone (ZAROXOLYN) 2.5 MG tablet, As directed by HF clinic, Disp: 10 tablet, Rfl: 3   milrinone (PRIMACOR) 20 MG/100 ML SOLN infusion, Inject 0.0414 mg/min into the vein continuous., Disp:  , Rfl:    potassium chloride (KLOR-CON) 10 MEQ tablet, TAKE 8 TABS IN THE MORNING, 6 TABS IN THE AFTERNOON, AND 6 TABS IN THE EVENING, Disp: 600 tablet, Rfl: 3   sacubitril-valsartan (ENTRESTO) 49-51 MG, Take 1 tablet by mouth 2 (two) times daily., Disp: 60 tablet, Rfl: 3   spironolactone (ALDACTONE) 25 MG tablet, Take 1 tablet (25 mg total) by mouth daily. (Patient taking differently: Take 25 mg by mouth daily. ), Disp: 90 tablet, Rfl: 3   torsemide (DEMADEX) 20 MG tablet, Take 5 tablets (100 mg total) by mouth 2 (two) times daily., Disp: 900 tablet, Rfl: 3   albuterol (PROAIR HFA) 108 (90 Base) MCG/ACT inhaler, Inhale 2 puffs into the lungs every 6 (six) hours as needed for wheezing or shortness of breath.  (Patient not taking: Reported on 07/10/2020), Disp: , Rfl:    dapagliflozin propanediol (FARXIGA) 10 MG TABS tablet, Take 1 tablet (10 mg total) by mouth daily before breakfast. (Patient not taking: Reported on 07/10/2020), Disp: 30 tablet, Rfl: 11   ipratropium-albuterol (DUONEB)  0.5-2.5 (3) MG/3ML SOLN, Take 3 mLs by nebulization every 4 (four) hours as needed. (Patient not taking: Reported on 07/10/2020), Disp: 360 mL, Rfl: 0   potassium chloride (KLOR-CON) 20 MEQ packet, Take by mouth.  (Patient not taking: Reported on 07/10/2020), Disp: , Rfl:    PRESCRIPTION MEDICATION, CPAP- AT BEDTIME (Patient not taking: Reported on 07/10/2020), Disp: , Rfl:  Allergies  Allergen Reactions   Metformin Diarrhea    And  caused severe "dry mouth," also   Metformin And Related Diarrhea    And caused severe "dry mouth," also      Social History   Socioeconomic History   Marital status: Single    Spouse name: Not on file   Number of children: 2   Years of education: Not on file   Highest education level: Not on file  Occupational History   Not on file  Tobacco Use   Smoking status: Former Smoker    Quit date: 10/19/2015    Years since quitting: 4.7   Smokeless tobacco: Never Used  Vaping Use   Vaping Use: Never assessed  Substance and Sexual Activity   Alcohol use: Yes    Alcohol/week: 0.0 standard drinks    Comment: occasionally   Drug use: Yes    Types: Marijuana    Comment: only socially   Sexual activity: Yes  Other Topics Concern   Not on file  Social History Narrative   Not on file   Social Determinants of Health   Financial Resource Strain: High Risk   Difficulty of Paying Living Expenses: Hard  Food Insecurity: Food Insecurity Present   Worried About Running Out of Food in the Last Year: Sometimes true   Ran Out of Food in the Last Year: Sometimes true  Transportation Needs: No Transportation Needs   Lack of Transportation (Medical): No   Lack of Transportation (Non-Medical): No  Physical Activity:    Days of Exercise per Week: Not on file   Minutes of Exercise per Session: Not on file  Stress:    Feeling of Stress : Not on file  Social Connections:    Frequency of Communication with Friends and Family: Not on file   Frequency of Social Gatherings with Friends and Family: Not on file   Attends Religious Services: Not on file   Active Member of Clubs or Organizations: Not on file   Attends Banker Meetings: Not on file   Marital Status: Not on file  Intimate Partner Violence:    Fear of Current or Ex-Partner: Not on file   Emotionally Abused: Not on file   Physically Abused: Not on file   Sexually Abused: Not on file     Physical Exam      Future Appointments  Date Time Provider Department Center  07/24/2020  7:45 AM [facility] DEVICE REMOTES [facility]  07/31/2020  8:30 AM [facility] PA/NP [facility] None  08/24/2020  8:50 AM Marcine Matar, MD [facility] None    BP (!) 98/0    Pulse 74    Resp (!) 22    SpO2 98%  Weight today-355 Weight yesterday-354 Last visit weight-356  Pt reports she is feeling ok-she is having menstrual cramps right now but she reports her breathing is ok.  She denies c/p, no increased sob. She states she had muscle spasms in her legs and she took 3 extra potassium and they went away about an hour later.  She took the 5 tablets of torsemide for 2 days  in a row so that's likely why she had the spasms and then why she took the extra potassium.  She denies increased sob however walking around her apt after weighing herself she was breathing louder.  Not much difference in her weight but her ankles look better, less swelling noted.  She has yet to reach the bariatric dept at [facility], she states she is remaining on hold for over an hour.  I called them as well and got her on a call back list from them.  Pt did not use her pill box for meds this week.  She states she was all over the place this wknd however the pill box tray was in the car. But when asked about why she didn't take meds from pill box since it was in the car she said she was home and didn't feel like going down to car and the kids couldn't find it so she took them out of the bottles.  She had one day missing from the pill box tray so that was filled up.   We finally got through to get [facility] registered with [facility] and got the online registration done and the virtual session signed up for on Thursday nov 4 from 4-5.    -need digoxin for refill   Kerry Hough, EMT-Paramedic [phone] [facility] Paramedic  07/17/20

## 2020-07-17 NOTE — Telephone Encounter (Signed)
Patient Advocate Encounter   Received notification from Medicaid that prior authorization for Sherryll Burger is required.   PA submitted on [employer] [id number] W Status is pending   Will continue to follow.

## 2020-07-18 NOTE — Telephone Encounter (Signed)
Advanced Heart Failure Patient Advocate Encounter  Prior Authorization for Sherryll Burger has been approved.    PA# [id number] W Effective dates: 07/17/20 through 07/17/21  Archer Asa, CPhT

## 2020-07-19 ENCOUNTER — Other Ambulatory Visit (HOSPITAL_COMMUNITY): Payer: Self-pay

## 2020-07-19 DIAGNOSIS — O903 Peripartum cardiomyopathy: Secondary | ICD-10-CM | POA: Diagnosis not present

## 2020-07-19 DIAGNOSIS — Z7984 Long term (current) use of oral hypoglycemic drugs: Secondary | ICD-10-CM | POA: Diagnosis not present

## 2020-07-19 DIAGNOSIS — Z6841 Body Mass Index (BMI) 40.0 and over, adult: Secondary | ICD-10-CM | POA: Diagnosis not present

## 2020-07-19 DIAGNOSIS — J45909 Unspecified asthma, uncomplicated: Secondary | ICD-10-CM | POA: Diagnosis not present

## 2020-07-19 DIAGNOSIS — Z452 Encounter for adjustment and management of vascular access device: Secondary | ICD-10-CM | POA: Diagnosis not present

## 2020-07-19 DIAGNOSIS — F17210 Nicotine dependence, cigarettes, uncomplicated: Secondary | ICD-10-CM | POA: Diagnosis not present

## 2020-07-19 DIAGNOSIS — I5023 Acute on chronic systolic (congestive) heart failure: Secondary | ICD-10-CM | POA: Diagnosis not present

## 2020-07-19 DIAGNOSIS — E119 Type 2 diabetes mellitus without complications: Secondary | ICD-10-CM | POA: Diagnosis not present

## 2020-07-19 DIAGNOSIS — I340 Nonrheumatic mitral (valve) insufficiency: Secondary | ICD-10-CM | POA: Diagnosis not present

## 2020-07-20 ENCOUNTER — Telehealth (HOSPITAL_COMMUNITY): Payer: Self-pay | Admitting: Licensed Clinical Social Worker

## 2020-07-20 ENCOUNTER — Other Ambulatory Visit (HOSPITAL_COMMUNITY): Payer: Self-pay | Admitting: Cardiology

## 2020-07-20 DIAGNOSIS — F331 Major depressive disorder, recurrent, moderate: Secondary | ICD-10-CM | POA: Diagnosis not present

## 2020-07-20 NOTE — Telephone Encounter (Addendum)
CSW received message from patient that she owes $284 for her electric bill that is due on Nov 10th or her electricity will be shut off and is inquiring about assistance.  CSW provided pt with numbers for [employer], and DSS who both provide emergency assistance- pt will call both to inquire about current emergency assistance options. Also emailed pt application for [facility] who offers emergency assistance.  CSW will continue to look into other options and assist as needed  Burna Sis, LCSW Clinical Social Worker Advanced Heart Failure Clinic Desk#: [phone] Cell#: [phone]

## 2020-07-21 ENCOUNTER — Telehealth (HOSPITAL_COMMUNITY): Payer: Self-pay | Admitting: Licensed Clinical Social Worker

## 2020-07-21 NOTE — Telephone Encounter (Signed)
Pt came in to work on [employer] together.  [employer] application completed and submitted as was Contractor.  CSW and pt called [facility] to advocate for pt keeping her electricity.  Representative states that nonpayment of current $396 bill will lead to her adjustment agreement being terminated but turn off would not take place until her bill for December was also past due so no immediate threat of power being turned off.  CSW explained pt medical condition and need for electricity for equipment- [facility] representative mailing pt application for MD to fill out to verify medical condition if approved will provide medical discount.  CSW also asked about SSI discount- patient will need to go to [facility] to complete- pt aware  Will continue to follow and assist as needed  Burna Sis, LCSW Clinical Social Worker Advanced Heart Failure Clinic Desk#: [phone] Cell#: [phone]

## 2020-07-24 ENCOUNTER — Ambulatory Visit (INDEPENDENT_AMBULATORY_CARE_PROVIDER_SITE_OTHER): Payer: Medicaid Other

## 2020-07-24 DIAGNOSIS — I428 Other cardiomyopathies: Secondary | ICD-10-CM | POA: Diagnosis not present

## 2020-07-24 LAB — CUP PACEART REMOTE DEVICE CHECK
Battery Remaining Longevity: 105 mo
Battery Voltage: 3 V
Brady Statistic RV Percent Paced: 0 %
Date Time Interrogation Session: 20211108093524
HighPow Impedance: 71 Ohm
Implantable Lead Implant Date: 20180802
Implantable Lead Location: 753860
Implantable Pulse Generator Implant Date: 20180802
Lead Channel Impedance Value: 342 Ohm
Lead Channel Impedance Value: 456 Ohm
Lead Channel Pacing Threshold Amplitude: 1 V
Lead Channel Pacing Threshold Pulse Width: 0.4 ms
Lead Channel Sensing Intrinsic Amplitude: 9 mV
Lead Channel Sensing Intrinsic Amplitude: 9 mV
Lead Channel Setting Pacing Amplitude: 2.5 V
Lead Channel Setting Pacing Pulse Width: 0.4 ms
Lead Channel Setting Sensing Sensitivity: 0.3 mV

## 2020-07-25 ENCOUNTER — Other Ambulatory Visit (HOSPITAL_COMMUNITY): Payer: Self-pay

## 2020-07-25 NOTE — Progress Notes (Signed)
Remote ICD transmission.   

## 2020-07-25 NOTE — Progress Notes (Signed)
Paramedicine Encounter    [name], female    DOB: 06-11-1981, 39 y.o.   MRN: [id number]   Patient Care Team: Patient, No Pcp Per as PCP - General (General Practice) Regan Lemming, MD as PCP - Electrophysiology (Cardiology) Laurey Morale, MD as PCP - Advanced Heart Failure (Cardiology)  Patient Active Problem List   Diagnosis Date Noted  . Acute on chronic systolic CHF (congestive heart failure) (HCC) 06/17/2019  . Obstructive sleep apnea 06/17/2019  . 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 Diabetes mellitus (HCC) 06/17/2019  . Amenorrhea 08/03/2018  . Congestive heart failure (HCC) 06/29/2018  . Hypotension 06/07/2018  . Rhinovirus infection 06/06/2018  . Asthma 06/05/2018  . Acute respiratory failure with hypoxia (HCC) 06/05/2018  . Hypokalemia 06/05/2018  . Snoring 09/23/2016  . Cough 04/10/2016  . 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 Tobacco abuse 01/25/2016  . Acute on chronic combined systolic and diastolic CHF (congestive heart failure) (HCC) 01/25/2016  . Mitral regurgitation   . Abnormal EKG-inf TWI 01/15/2016  . Positive D dimer-CTA neg for PE 01/15/2016  . Morbid obesity- BMI 57 01/14/2016  . Abdominal pain 01/14/2016  . 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 At risk for sleep apnea 01/14/2016  . Cardiomyopathy (HCC) 11/01/2015  . Morbid obesity with BMI of 50.0-59.9, adult (HCC) 08/16/2015    Current Outpatient Medications:  .  atorvastatin (LIPITOR) 10 MG tablet, Take 1 tablet (10 mg total) by mouth daily. (Patient taking differently: Take 10 mg by mouth daily. ), Disp: 90 tablet, Rfl: 3 .  carvedilol (COREG) 12.5 MG tablet, Take 1 tablet (12.5 mg total) by mouth 2 (two) times daily. Dose increase, Disp: 180 tablet, Rfl: 2 .  digoxin (LANOXIN) 0.125 MG tablet, Take 1 tablet (0.125 mg total) by mouth daily. (Patient taking differently: Take 0.125 mg by mouth daily. ), Disp: 90 tablet, Rfl:  3 .  ivabradine (CORLANOR) 7.5 MG TABS tablet, Take 1 tablet (7.5 mg total) by mouth 2 (two) times daily with a meal., Disp: 60 tablet, Rfl: 3 .  Magnesium Oxide 400 (240 Mg) MG TABS, Take 2 tablets by mouth every morning and 1 tablet by mouth every evening, Disp: 90 tablet, Rfl: 6 .  metolazone (ZAROXOLYN) 2.5 MG tablet, As directed by HF clinic, Disp: 10 tablet, Rfl: 3 .  milrinone (PRIMACOR) 20 MG/100 ML SOLN infusion, Inject 0.0414 mg/min into the vein continuous., Disp:  , Rfl:  .  potassium chloride (KLOR-CON) 10 MEQ tablet, TAKE 8 TABS IN THE MORNING, 6 TABS IN THE AFTERNOON, AND 6 TABS IN THE EVENING, Disp: 600 tablet, Rfl: 3 .  sacubitril-valsartan (ENTRESTO) 49-51 MG, Take 1 tablet by mouth 2 (two) times daily., Disp: 60 tablet, Rfl: 3 .  spironolactone (ALDACTONE) 25 MG tablet, Take 1 tablet (25 mg total) by mouth daily. (Patient taking differently: Take 25 mg by mouth daily. ), Disp: 90 tablet, Rfl: 3 .  torsemide (DEMADEX) 20 MG tablet, Take 5 tablets (100 mg total) by mouth 2 (two) times daily., Disp: 900 tablet, Rfl: 3 .  albuterol (PROAIR HFA) 108 (90 Base) MCG/ACT inhaler, Inhale 2 puffs into the lungs every 6 (six) hours as needed for wheezing or shortness of breath.  (Patient not taking: Reported on 07/10/2020), Disp: , Rfl:  .  dapagliflozin propanediol (FARXIGA) 10 MG TABS tablet, Take 1 tablet (10 mg total) by mouth daily before breakfast. (Patient not taking: Reported on 07/10/2020), Disp: 30 tablet, Rfl: 11 .  ipratropium-albuterol (DUONEB)  0.5-2.5 (3) MG/3ML SOLN, Take 3 mLs by nebulization every 4 (four) hours as needed. (Patient not taking: Reported on 07/10/2020), Disp: 360 mL, Rfl: 0 .  potassium chloride (KLOR-CON) 20 MEQ packet, Take by mouth.  (Patient not taking: Reported on 07/10/2020), Disp: , Rfl:  .  PRESCRIPTION MEDICATION, CPAP- AT BEDTIME (Patient not taking: Reported on 07/10/2020), Disp: , Rfl:  Allergies  Allergen Reactions  . Metformin Diarrhea    And  caused severe "dry mouth," also  . Metformin And Related Diarrhea    And caused severe "dry mouth," also      Social History   Socioeconomic History  . Marital status: Single    Spouse name: Not on file  . Number of children: 2  . Years of education: Not on file  . Highest education level: Not on file  Occupational History  . Not on file  Tobacco Use  . Smoking status: Former Smoker    Quit date: 10/19/2015    Years since quitting: 4.7  . Smokeless tobacco: Never Used  Vaping Use  . Vaping Use: Never assessed  Substance and Sexual Activity  . Alcohol use: Yes    Alcohol/week: 0.0 standard drinks    Comment: occasionally  . Drug use: Yes    Types: Marijuana    Comment: only socially  . Sexual activity: Yes  Other Topics Concern  . Not on file  Social History Narrative  . Not on file   Social Determinants of Health   Financial Resource Strain: High Risk  . Difficulty of Paying Living Expenses: Hard  Food Insecurity: Food Insecurity Present  . Worried About Programme researcher, broadcasting/film/video in the Last Year: Sometimes true  . Ran Out of Food in the Last Year: Sometimes true  Transportation Needs: No Transportation Needs  . Lack of Transportation (Medical): No  . Lack of Transportation (Non-Medical): No  Physical Activity:   . Days of Exercise per Week: Not on file  . Minutes of Exercise per Session: Not on file  Stress:   . Feeling of Stress : Not on file  Social Connections:   . Frequency of Communication with Friends and Family: Not on file  . Frequency of Social Gatherings with Friends and Family: Not on file  . Attends Religious Services: Not on file  . Active Member of Clubs or Organizations: Not on file  . Attends Banker Meetings: Not on file  . Marital Status: Not on file  Intimate Partner Violence:   . Fear of Current or Ex-Partner: Not on file  . Emotionally Abused: Not on file  . Physically Abused: Not on file  . Sexually Abused: Not on file     Physical Exam      Future Appointments  Date Time Provider Department Center  07/31/2020  8:30 AM [facility] PA/NP [facility] None  08/24/2020  8:50 AM Marcine Matar, MD [facility] None  10/23/2020  8:35 AM [facility] DEVICE REMOTES [facility]  01/22/2021  8:35 AM [facility] DEVICE REMOTES [facility]  04/23/2021  8:35 AM [facility] DEVICE REMOTES [facility]  07/23/2021  8:35 AM [facility] DEVICE REMOTES [facility]  10/22/2021  8:35 AM [facility] DEVICE REMOTES [facility]  01/21/2022  8:35 AM [facility] DEVICE REMOTES [facility]    BP (!) 106/0   Pulse 90   Resp 20   SpO2 98%  Weight today-359 Weight yesterday-357 Last visit weight-355  Pt reports she got denied for bariatric surgery due to her insurance being medicaid.  She states her breathing is good.  She has not taken her meds yet due to her not having any food in the house. I am here at 2pm today and she didn't get up this morning to get food so she can take meds.   -needs entresto -torsemide new dose of 100mg  BID --ordered from pharmacy and will deliver tomor.   meds verified and pill box refilled.    , EMT-Paramedic [phone] [facility] Paramedic  07/25/20

## 2020-07-26 DIAGNOSIS — E119 Type 2 diabetes mellitus without complications: Secondary | ICD-10-CM | POA: Diagnosis not present

## 2020-07-26 DIAGNOSIS — Z7984 Long term (current) use of oral hypoglycemic drugs: Secondary | ICD-10-CM | POA: Diagnosis not present

## 2020-07-26 DIAGNOSIS — I5023 Acute on chronic systolic (congestive) heart failure: Secondary | ICD-10-CM | POA: Diagnosis not present

## 2020-07-26 DIAGNOSIS — Z452 Encounter for adjustment and management of vascular access device: Secondary | ICD-10-CM | POA: Diagnosis not present

## 2020-07-26 DIAGNOSIS — Z6841 Body Mass Index (BMI) 40.0 and over, adult: Secondary | ICD-10-CM | POA: Diagnosis not present

## 2020-07-26 DIAGNOSIS — J45909 Unspecified asthma, uncomplicated: Secondary | ICD-10-CM | POA: Diagnosis not present

## 2020-07-26 DIAGNOSIS — O903 Peripartum cardiomyopathy: Secondary | ICD-10-CM | POA: Diagnosis not present

## 2020-07-26 DIAGNOSIS — I340 Nonrheumatic mitral (valve) insufficiency: Secondary | ICD-10-CM | POA: Diagnosis not present

## 2020-07-26 DIAGNOSIS — F17210 Nicotine dependence, cigarettes, uncomplicated: Secondary | ICD-10-CM | POA: Diagnosis not present

## 2020-07-27 DIAGNOSIS — F331 Major depressive disorder, recurrent, moderate: Secondary | ICD-10-CM | POA: Diagnosis not present

## 2020-07-28 DIAGNOSIS — F331 Major depressive disorder, recurrent, moderate: Secondary | ICD-10-CM | POA: Diagnosis not present

## 2020-07-30 NOTE — Progress Notes (Signed)
Advanced Heart Failure Clinic Note   Referring Physician:PCP: Patient, No Pcp Per PCP-Cardiologist: Dr. 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   She presented to the ER on 06/17/19 with several days of worsening HF with SOB at rest, marked edema and orthopnea/PND, not responding to increased doses of oral diuretics. She admitted to high levels of water and Gatorade intake. Her admit wt was 380 lb (Dry wt ~360 lb). She was started on IV Lasix but had initial poor urinary response. PO metolazone added as adjunct. Echo showed EF <20%, relatively normal RV, moderate MR. RHC 06/21/19 with low output (CI 1.8) and elevated PCWP but near normal RA pressure. She was started on inotropic therapy w/ milrinone for low output, 0.25 mcg/kg/min. Also treated w/ Entresto, Coreg, spironolactone, digoxin and ivabradine. She was later transitioned off of IV Lasix to po torsemide 80 mg qam/60 mg qpm.  Unfortunately, she was not a transplant candidate due to her morbid obesity. We discussed her at [facility] with Dr. Donata Clay. She will need significant weight loss to get to LVAD. Will use home milrinone to try to facilitate increased activity to work towards weight loss. She understands that milrinone is not a good end-point and that we would like to use it as a bridge to eventual LVAD. RV function seems adequate for LVAD. Tunneled catheter was placed for home milrinone and home health was arranged. She was discharged home on 06/24/20 on milrinone 0.25 mcg/kg/min.   06/27/19 she presented to the [facility] w/ CC of palpitations. Was brought in by EMS and was reportedly in SVT with HR in the 180s. She was given adenosine and converted to NSR prior to arrival to the ED.  PICC exchanged 10/15/19  Today she returns for HF follow up. She remains on milrinone. Last visit torsemide was increased to 100 mg twice a day and instructed to take  metolazone for 2 days. Overall feeling ok.  SOB with steps. Denies PND/Orthopnea. Appetite ok. No fever or chills. Weight at home 356-358 pounds. Having ongoing issues with medication compliance. Missing afternoon doses of torsemide about 4 days a week. Taking all medications. Followed by [facility] for home milrinone. Followed by HF Paramedicine. She has been sexually active.   Labs (8/21): K 4.4, creatinine 0.9, hgb 14.4 Labs (06/13/20): K 4 Creatinine 0.97   PMH:  1. Chronic systolic CHF: Nonischemic cardiomyopathy.  No family history of cardiomyopathy, no heavy ETOH, cocaine, or amphetamines.  Medtronic ICD.  - Echo (12/16) with EF 20-25%, moderate MR.  - Echo (5/17) with severely dilated LV, EF 10-15%, severe functional MR, mildly decreased RV systolic function. - LHC/RHC (2/77): No significant coronary disease; mean RA 15, PA 61/25 mean 41, mean PCWP 18, CI 1.7 (Fick), PVR 5.4 WU.  - Echo (10/17): EF 20%, severely dilated LV, normal RV size with mildly decreased systolic function, moderate-severe central MR.  - Echo 04/13/17  LVEF 15-20%, Severe LV dilation, Mod LAE, Mild RAE - CPX (5/19): RER 0.95, peak VO2 9.8, VE/VCO2 26 => submaximal but likely severe functional impairement.  - Echo (7/19): EF 20-25%, moderate LV dilation, RV normal size with mildly decreased systolic function, moderate central MR.  - Echo (10/20): EF < 20%, severe LV enlargement, normal RV>  - RHC (10/20): mean RA 7, PA 65/25, mean PCWP 27, CI 1.8, PVR 3.87, PAPI 5.7.  2. Morbid obesity.  3. Mitral regurgitation: Likely  secondary (functional).  Echo (10/17) with moderate-severe MR. Echo (7/19) with moderate central MR.  4. OSA: CPAP.  5. SVT  Current Outpatient Medications  Medication Sig Dispense Refill  . atorvastatin (LIPITOR) 10 MG tablet Take 1 tablet (10 mg total) by mouth daily. (Patient taking differently: Take 10 mg by mouth daily. ) 90 tablet 3  . carvedilol (COREG) 12.5 MG tablet Take 1 tablet (12.5 mg total) by  mouth 2 (two) times daily. Dose increase 180 tablet 2  . digoxin (LANOXIN) 0.125 MG tablet Take 1 tablet (0.125 mg total) by mouth daily. (Patient taking differently: Take 0.125 mg by mouth daily. ) 90 tablet 3  . ipratropium-albuterol (DUONEB) 0.5-2.5 (3) MG/3ML SOLN Take 3 mLs by nebulization every 4 (four) hours as needed. 360 mL 0  . ivabradine (CORLANOR) 7.5 MG TABS tablet Take 1 tablet (7.5 mg total) by mouth 2 (two) times daily with a meal. 60 tablet 3  . Magnesium Oxide 400 (240 Mg) MG TABS Take 2 tablets by mouth every morning and 1 tablet by mouth every evening 90 tablet 6  . metolazone (ZAROXOLYN) 2.5 MG tablet As directed by HF clinic 10 tablet 3  . milrinone (PRIMACOR) 20 MG/100 ML SOLN infusion Inject 0.0414 mg/min into the vein continuous.    . potassium chloride (KLOR-CON) 10 MEQ tablet TAKE 8 TABS IN THE MORNING, 6 TABS IN THE AFTERNOON, AND 6 TABS IN THE EVENING 600 tablet 3  . PRESCRIPTION MEDICATION CPAP- AT BEDTIME    . sacubitril-valsartan (ENTRESTO) 49-51 MG Take 1 tablet by mouth 2 (two) times daily. 60 tablet 3  . spironolactone (ALDACTONE) 25 MG tablet Take 1 tablet (25 mg total) by mouth daily. (Patient taking differently: Take 25 mg by mouth daily. ) 90 tablet 3  . torsemide (DEMADEX) 20 MG tablet Take 5 tablets (100 mg total) by mouth 2 (two) times daily. 900 tablet 3   No current facility-administered medications for this encounter.    Allergies  Allergen Reactions  . Metformin Diarrhea    And caused severe "dry mouth," also  . Metformin And Related Diarrhea    And caused severe "dry mouth," also      Social History   Socioeconomic History  . Marital status: Single    Spouse name: Not on file  . Number of children: 2  . Years of education: Not on file  . Highest education level: Not on file  Occupational History  . Not on file  Tobacco Use  . Smoking status: Former Smoker    Quit date: 10/19/2015    Years since quitting: 4.7  . Smokeless tobacco:  Never Used  Vaping Use  . Vaping Use: Never assessed  Substance and Sexual Activity  . Alcohol use: Yes    Alcohol/week: 0.0 standard drinks    Comment: occasionally  . Drug use: Yes    Types: Marijuana    Comment: only socially  . Sexual activity: Yes  Other Topics Concern  . Not on file  Social History Narrative  . Not on file   Social Determinants of Health   Financial Resource Strain: High Risk  . Difficulty of Paying Living Expenses: Hard  Food Insecurity: Food Insecurity Present  . Worried About Programme researcher, broadcasting/film/video in the Last Year: Sometimes true  . Ran Out of Food in the Last Year: Sometimes true  Transportation Needs: No Transportation Needs  . Lack of Transportation (Medical): No  . Lack of Transportation (Non-Medical): No  Physical Activity:   .  Days of Exercise per Week: Not on file  . Minutes of Exercise per Session: Not on file  Stress:   . Feeling of Stress : Not on file  Social Connections:   . Frequency of Communication with Friends and Family: Not on file  . Frequency of Social Gatherings with Friends and Family: Not on file  . Attends Religious Services: Not on file  . Active Member of Clubs or Organizations: Not on file  . Attends Banker Meetings: Not on file  . Marital Status: Not on file  Intimate Partner Violence:   . Fear of Current or Ex-Partner: Not on file  . Emotionally Abused: Not on file  . Physically Abused: Not on file  . Sexually Abused: Not on file      Family History  Problem Relation Age of Onset  . Heart attack Mother   . Asthma Mother   . Heart failure Mother   . Hypertension Father   . Diabetes Brother     Vitals:   07/31/20 0929  BP: 140/84  Pulse: 95  SpO2: 97%  Weight: (!) 163.7 kg (361 lb)   Wt Readings from Last 3 Encounters:  07/31/20 (!) 163.7 kg (361 lb)  07/06/20 (!) 165.1 kg (364 lb)  06/13/20 (!) 160.1 kg (353 lb)   General:  Well appearing. No resp difficulty HEENT: normal Neck:  supple. no JVD. Carotids 2+ bilat; no bruits. No lymphadenopathy or thryomegaly appreciated. Cor: PMI nondisplaced. Regular rate & rhythm. No rubs, gallops or murmurs. R upper chest tunneled catheter.  Lungs: clear Abdomen: obese, soft, nontender, nondistended. No hepatosplenomegaly. No bruits or masses. Good bowel sounds. Extremities: no cyanosis, clubbing, rash, edema Neuro: alert & orientedx3, cranial nerves grossly intact. moves all 4 extremities w/o difficulty. Affect pleasant   ASSESSMENT & PLAN: 1. Chronic systolic CHF:  Nonischemic cardiomyopathy.  Body habitus precludes cardiac MRI.  Echo 06/2019 with EF < 20%, relatively normal RV, moderate MR.  RHC 06/20/20 with low output (CI 1.8) and elevated PCWP but near normal RA pressure, started on milrinone 0.25 with plans to continue home milrinone as bridge to possible LVAD if she could achieve significant weight loss.  Unfortunately, she has not succeeded in losing any weight.  -NYHA III. Volume status stable. Continue torsemide 100 mg twice a day.   - Optivol- fluid index below threshold. Activity ~ 2 hours per day. - Remains on milrinone.   - Continue Coreg 12.5 mg bid.   - Continue Entresto to 49/51 bid  - Continue ivabradine to 7.5 mg twice a day.   - Continue spironolactone 25 daily.  - Continue digoxin 0.125 mg, check level today.  -Continue farxiga 10 mg daily  - Discussed that she needs to prevent pregnancy. We have discussed the fetotoxic risks of her cardiac meds were she to become pregnant.  -  VAD consideration: Her size and social support will be issues.  She would not be a transplant candidate at this point due to weight. We discussed her at [facility], Dr. Donata Clay has seen.  She will need significant weight loss to get to LVAD.  Have been using home milrinone to try to facilitate increased activity to work towards weight loss.  She understands that milrinone is not a good end-point and that we would like to use it as a bridge to  eventual LVAD. RV function seems adequate for LVAD.  However, she has made no progress towards weight loss.  Continue HF Paramedicine.  2. Morbid  obesity:  Body mass index is 60.07 kg/m. - Turned down for bariatric surgery due to insurance.  - Discussed portion control.  3. SVT: had ED visit 10/11 for SVT noted by EMS that converted back to NSR after dose of adenosine. This was in the setting of hypokalemia and hypomagnesemia, at 3.0 and 1.5 respectively. Also in the setting of inotropic therapy w/ milrinone.   4. OSA: Continue nightly CPAP. 5. DMII:  Needs follow up with PCP.   Follow up in 4 weeks with APP and 3 months with Dr Shirlee Latch.     Tonye Becket, NP 07/31/20

## 2020-07-31 ENCOUNTER — Other Ambulatory Visit: Payer: Self-pay

## 2020-07-31 ENCOUNTER — Encounter (HOSPITAL_COMMUNITY): Payer: Self-pay

## 2020-07-31 ENCOUNTER — Ambulatory Visit (HOSPITAL_COMMUNITY)
Admission: RE | Admit: 2020-07-31 | Discharge: 2020-07-31 | Disposition: A | Discharge status: Home / Self Care | Payer: Medicaid Other | Source: Ambulatory Visit | Attending: Adult Health | Admitting: Adult Health

## 2020-07-31 VITALS — BP 140/84 | HR 95 | Wt 361.0 lb

## 2020-07-31 DIAGNOSIS — Z95810 Presence of automatic (implantable) cardiac defibrillator: Secondary | ICD-10-CM | POA: Diagnosis not present

## 2020-07-31 DIAGNOSIS — Z5941 Food insecurity: Secondary | ICD-10-CM | POA: Diagnosis not present

## 2020-07-31 DIAGNOSIS — G4733 Obstructive sleep apnea (adult) (pediatric): Secondary | ICD-10-CM | POA: Diagnosis not present

## 2020-07-31 DIAGNOSIS — G4730 Sleep apnea, unspecified: Secondary | ICD-10-CM | POA: Diagnosis not present

## 2020-07-31 DIAGNOSIS — I5022 Chronic systolic (congestive) heart failure: Secondary | ICD-10-CM | POA: Insufficient documentation

## 2020-07-31 DIAGNOSIS — Z8249 Family history of ischemic heart disease and other diseases of the circulatory system: Secondary | ICD-10-CM | POA: Insufficient documentation

## 2020-07-31 DIAGNOSIS — I340 Nonrheumatic mitral (valve) insufficiency: Secondary | ICD-10-CM | POA: Diagnosis not present

## 2020-07-31 DIAGNOSIS — Z596 Low income: Secondary | ICD-10-CM | POA: Insufficient documentation

## 2020-07-31 DIAGNOSIS — E119 Type 2 diabetes mellitus without complications: Secondary | ICD-10-CM | POA: Insufficient documentation

## 2020-07-31 DIAGNOSIS — Z79899 Other long term (current) drug therapy: Secondary | ICD-10-CM | POA: Insufficient documentation

## 2020-07-31 DIAGNOSIS — I428 Other cardiomyopathies: Secondary | ICD-10-CM | POA: Diagnosis not present

## 2020-07-31 DIAGNOSIS — I5042 Chronic combined systolic (congestive) and diastolic (congestive) heart failure: Secondary | ICD-10-CM

## 2020-07-31 DIAGNOSIS — I471 Supraventricular tachycardia: Secondary | ICD-10-CM | POA: Insufficient documentation

## 2020-07-31 DIAGNOSIS — Z87891 Personal history of nicotine dependence: Secondary | ICD-10-CM | POA: Diagnosis not present

## 2020-07-31 DIAGNOSIS — Z6841 Body Mass Index (BMI) 40.0 and over, adult: Secondary | ICD-10-CM | POA: Diagnosis not present

## 2020-07-31 DIAGNOSIS — Z9989 Dependence on other enabling machines and devices: Secondary | ICD-10-CM | POA: Insufficient documentation

## 2020-07-31 NOTE — Patient Instructions (Signed)
Your physician recommends that you schedule a follow-up appointment in 4 weeks with APP and 4 months with Dr. Shirlee Latch.  If you have any questions or concerns before your next appointment please send [place] a message through [place] or call our office at [phone].    TO LEAVE A MESSAGE FOR THE NURSE SELECT OPTION 2, PLEASE LEAVE A MESSAGE INCLUDING: . YOUR NAME . DATE OF BIRTH . CALL BACK NUMBER . REASON FOR CALL**this is important as we prioritize the call backs  YOU WILL RECEIVE A CALL BACK THE SAME DAY AS LONG AS YOU CALL BEFORE 4:00 PM   At the Advanced Heart Failure Clinic, you and your health needs are our priority. As part of our continuing mission to provide you with exceptional heart care, we have created designated Provider Care Teams. These Care Teams include your primary Cardiologist (physician) and Advanced Practice Providers (APPs- Physician Assistants and Nurse Practitioners) who all work together to provide you with the care you need, when you need it.   You may see any of the following providers on your designated Care Team at your next follow up: Marland Kitchen Dr Arvilla Meres . Dr Marca Ancona . Tonye Becket, NP . Robbie Lis, PA . Karle Plumber, PharmD   Please be sure to bring in all your medications bottles to every appointment.

## 2020-08-01 ENCOUNTER — Other Ambulatory Visit (HOSPITAL_COMMUNITY): Payer: Self-pay

## 2020-08-01 ENCOUNTER — Other Ambulatory Visit (HOSPITAL_COMMUNITY): Payer: Self-pay | Admitting: Cardiology

## 2020-08-01 DIAGNOSIS — I5022 Chronic systolic (congestive) heart failure: Secondary | ICD-10-CM

## 2020-08-01 MED ORDER — TORSEMIDE 100 MG PO TABS
100.0000 mg | ORAL_TABLET | Freq: Two times a day (BID) | ORAL | 3 refills | Status: DC
Start: 2020-08-01 — End: 2020-12-05

## 2020-08-01 MED ORDER — MAGNESIUM OXIDE -MG SUPPLEMENT 400 (240 MG) MG PO TABS
ORAL_TABLET | ORAL | 6 refills | Status: DC
Start: 2020-08-01 — End: 2021-02-22

## 2020-08-01 NOTE — Progress Notes (Signed)
Orders for PREP program placed

## 2020-08-01 NOTE — Progress Notes (Signed)
Paramedicine Encounter    [name], female    DOB: 12-07-1980, 39 y.o.   MRN: [id number]   Patient Care Team: Patient, No Pcp Per as PCP - General (General Practice) Regan Lemming, MD as PCP - Electrophysiology (Cardiology) Laurey Morale, MD as PCP - Advanced Heart Failure (Cardiology)  Patient Active Problem List   Diagnosis Date Noted   Acute on chronic systolic CHF (congestive heart failure) (HCC) 06/17/2019   Obstructive sleep apnea 06/17/2019   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   Diabetes mellitus (HCC) 06/17/2019   Amenorrhea 08/03/2018   Congestive heart failure (HCC) 06/29/2018   Hypotension 06/07/2018   Rhinovirus infection 06/06/2018   Asthma 06/05/2018   Acute respiratory failure with hypoxia (HCC) 06/05/2018   Hypokalemia 06/05/2018   Snoring 09/23/2016   Cough 04/10/2016   NICM (nonischemic cardiomyopathy) (HCC) 01/25/2016   Tobacco abuse 01/25/2016   Acute on chronic combined systolic and diastolic CHF (congestive heart failure) (HCC) 01/25/2016   Mitral regurgitation    Abnormal EKG-inf TWI 01/15/2016   Positive D dimer-CTA neg for PE 01/15/2016   Morbid obesity- BMI 57 01/14/2016   Abdominal pain 01/14/2016   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 (Patient taking differently: Take 10 mg by mouth daily. ), Disp: 90 tablet, Rfl: 3   carvedilol (COREG) 12.5 MG tablet, Take 1 tablet (12.5 mg total) by mouth 2 (two) times daily. Dose increase, Disp: 180 tablet, Rfl: 2   digoxin (LANOXIN) 0.125 MG tablet, Take 1 tablet (0.125 mg total) by mouth daily. (Patient taking differently: Take 0.125 mg by mouth daily. ), Disp: 90 tablet, Rfl:  3   ivabradine (CORLANOR) 7.5 MG TABS tablet, Take 1 tablet (7.5 mg total) by mouth 2 (two) times daily with a meal., Disp: 60 tablet, Rfl: 3   metolazone (ZAROXOLYN) 2.5 MG tablet, As directed by HF clinic, Disp: 10 tablet, Rfl: 3   milrinone (PRIMACOR) 20 MG/100 ML SOLN infusion, Inject 0.0414 mg/min into the vein continuous., Disp:  , Rfl:    potassium chloride (KLOR-CON) 10 MEQ tablet, TAKE 8 TABS IN THE MORNING, 6 TABS IN THE AFTERNOON, AND 6 TABS IN THE EVENING, Disp: 600 tablet, Rfl: 3   sacubitril-valsartan (ENTRESTO) 49-51 MG, Take 1 tablet by mouth 2 (two) times daily., Disp: 60 tablet, Rfl: 3   spironolactone (ALDACTONE) 25 MG tablet, Take 1 tablet (25 mg total) by mouth daily. (Patient taking differently: Take 25 mg by mouth daily. ), Disp: 90 tablet, Rfl: 3   ipratropium-albuterol (DUONEB) 0.5-2.5 (3) MG/3ML SOLN, Take 3 mLs by nebulization every 4 (four) hours as needed. (Patient not taking: Reported on 08/01/2020), Disp: 360 mL, Rfl: 0   Magnesium Oxide 400 (240 Mg) MG TABS, Take 2 tablets by mouth every morning and 1 tablet by mouth every evening, Disp: 90 tablet, Rfl: 6   PRESCRIPTION MEDICATION, CPAP- AT BEDTIME (Patient not taking: Reported on 08/01/2020), Disp: , Rfl:    torsemide (DEMADEX) 100 MG tablet, Take 1 tablet (100 mg total) by mouth 2 (two) times daily., Disp: 60 tablet, Rfl: 3 Allergies  Allergen Reactions   Metformin Diarrhea    And caused severe "dry mouth," also   Metformin And Related Diarrhea    And  caused severe "dry mouth," also      Social History   Socioeconomic History   Marital status: Single    Spouse name: Not on file   Number of children: 2   Years of education: Not on file   Highest education level: Not on file  Occupational History   Not on file  Tobacco Use   Smoking status: Former Smoker    Quit date: 10/19/2015    Years since quitting: 4.7   Smokeless tobacco: Never Used  Vaping Use   Vaping Use: Never assessed   Substance and Sexual Activity   Alcohol use: Yes    Alcohol/week: 0.0 standard drinks    Comment: occasionally   Drug use: Yes    Types: Marijuana    Comment: only socially   Sexual activity: Yes  Other Topics Concern   Not on file  Social History Narrative   Not on file   Social Determinants of Health   Financial Resource Strain: High Risk   Difficulty of Paying Living Expenses: Hard  Food Insecurity: Food Insecurity Present   Worried About Running Out of Food in the Last Year: Sometimes true   Ran Out of Food in the Last Year: Sometimes true  Transportation Needs: No Transportation Needs   Lack of Transportation (Medical): No   Lack of Transportation (Non-Medical): No  Physical Activity:    Days of Exercise per Week: Not on file   Minutes of Exercise per Session: Not on file  Stress:    Feeling of Stress : Not on file  Social Connections:    Frequency of Communication with Friends and Family: Not on file   Frequency of Social Gatherings with Friends and Family: Not on file   Attends Religious Services: Not on file   Active Member of Clubs or Organizations: Not on file   Attends Banker Meetings: Not on file   Marital Status: Not on file  Intimate Partner Violence:    Fear of Current or Ex-Partner: Not on file   Emotionally Abused: Not on file   Physically Abused: Not on file   Sexually Abused: Not on file    Physical Exam      Future Appointments  Date Time Provider Department Center  08/16/2020  2:50 PM Thressa Sheller D, CNM [facility] None  08/24/2020  8:50 AM Marcine Matar, MD [facility] None  08/28/2020  1:30 PM [facility] PA/NP [facility] None  10/23/2020  8:35 AM [facility] DEVICE REMOTES [facility]  11/02/2020  1:40 PM Laurey Morale, MD [facility] None  01/22/2021  8:35 AM [facility] DEVICE REMOTES [facility]  04/23/2021  8:35 AM [facility] DEVICE REMOTES [facility]  07/23/2021  8:35  AM [facility] DEVICE REMOTES [facility]  10/22/2021  8:35 AM [facility] DEVICE REMOTES [facility]  01/21/2022  8:35 AM [facility] DEVICE REMOTES [facility]    BP (!) 90/0    Pulse 82    Resp 18    SpO2 98%   Weight today-? Weight yesterday-361 @ clinic   Last visit weight-361 @ clinic   Pt was seen in clinic yesterday for f/u.  No changes at this time.  She has appoint with gynocologist in December.  She denies increased sob. No dizziness.  meds verified and pill box refilled.  She is out of entresto. We ordered it last week and they did not deliver.  I am taking [facility] to place in bubble packs with the exception of her potassium due  to lack of space in the bubble pack. She is ok with this.  [facility] took [facility]s to [facility] and [facility] will fix 2 wks worth for now and they will be ready in the morning.  I relayed this to pt.   Kerry Hough, EMT-Paramedic [phone] [facility] Paramedic  08/01/20

## 2020-08-02 DIAGNOSIS — O903 Peripartum cardiomyopathy: Secondary | ICD-10-CM | POA: Diagnosis not present

## 2020-08-02 DIAGNOSIS — Z7984 Long term (current) use of oral hypoglycemic drugs: Secondary | ICD-10-CM | POA: Diagnosis not present

## 2020-08-02 DIAGNOSIS — I5023 Acute on chronic systolic (congestive) heart failure: Secondary | ICD-10-CM | POA: Diagnosis not present

## 2020-08-02 DIAGNOSIS — J45909 Unspecified asthma, uncomplicated: Secondary | ICD-10-CM | POA: Diagnosis not present

## 2020-08-02 DIAGNOSIS — F331 Major depressive disorder, recurrent, moderate: Secondary | ICD-10-CM | POA: Diagnosis not present

## 2020-08-02 DIAGNOSIS — E119 Type 2 diabetes mellitus without complications: Secondary | ICD-10-CM | POA: Diagnosis not present

## 2020-08-02 DIAGNOSIS — I340 Nonrheumatic mitral (valve) insufficiency: Secondary | ICD-10-CM | POA: Diagnosis not present

## 2020-08-02 DIAGNOSIS — F17210 Nicotine dependence, cigarettes, uncomplicated: Secondary | ICD-10-CM | POA: Diagnosis not present

## 2020-08-02 DIAGNOSIS — Z6841 Body Mass Index (BMI) 40.0 and over, adult: Secondary | ICD-10-CM | POA: Diagnosis not present

## 2020-08-02 DIAGNOSIS — Z452 Encounter for adjustment and management of vascular access device: Secondary | ICD-10-CM | POA: Diagnosis not present

## 2020-08-04 ENCOUNTER — Telehealth: Payer: Self-pay

## 2020-08-04 NOTE — Telephone Encounter (Signed)
Call to pt reference PREP referral Explained program, interested in loosing 50 lbs Needs either a 10am or 1-215pm class  Advised will call back with start of next class

## 2020-08-08 ENCOUNTER — Telehealth (HOSPITAL_COMMUNITY): Payer: Self-pay | Admitting: Licensed Clinical Social Worker

## 2020-08-08 NOTE — Telephone Encounter (Signed)
Pt reached out to CSW to request help with upcoming Thanksgiving holiday expenses.  CSW able to provide $25 [employer] gift card to help with food expenses.    CSW also made copies of pt and childrens SS cards, birth certificates, and pt ID- she is planning on going to [employer] first thing tomorrow to discuss assistance.  Pt has not heard back from [employer] but hasn't been checking email- CSW encouraged her to check her email ASAP to see if she received notice from [facility] regarding potential assistance with utility bills.  Pt also informed she was chosen for project based voucher program so she can get long term affordable housing- is pursing this currently and will reach out to CSW if she needs any help with process.  Burna Sis, LCSW Clinical Social Worker [facility] Desk#: [phone] Cell#: [phone]

## 2020-08-09 ENCOUNTER — Other Ambulatory Visit (HOSPITAL_COMMUNITY): Payer: Self-pay

## 2020-08-09 DIAGNOSIS — I340 Nonrheumatic mitral (valve) insufficiency: Secondary | ICD-10-CM | POA: Diagnosis not present

## 2020-08-09 DIAGNOSIS — Z7984 Long term (current) use of oral hypoglycemic drugs: Secondary | ICD-10-CM | POA: Diagnosis not present

## 2020-08-09 DIAGNOSIS — F331 Major depressive disorder, recurrent, moderate: Secondary | ICD-10-CM | POA: Diagnosis not present

## 2020-08-09 DIAGNOSIS — I5023 Acute on chronic systolic (congestive) heart failure: Secondary | ICD-10-CM | POA: Diagnosis not present

## 2020-08-09 DIAGNOSIS — E119 Type 2 diabetes mellitus without complications: Secondary | ICD-10-CM | POA: Diagnosis not present

## 2020-08-09 DIAGNOSIS — Z452 Encounter for adjustment and management of vascular access device: Secondary | ICD-10-CM | POA: Diagnosis not present

## 2020-08-09 DIAGNOSIS — O903 Peripartum cardiomyopathy: Secondary | ICD-10-CM | POA: Diagnosis not present

## 2020-08-09 DIAGNOSIS — J45909 Unspecified asthma, uncomplicated: Secondary | ICD-10-CM | POA: Diagnosis not present

## 2020-08-09 DIAGNOSIS — Z6841 Body Mass Index (BMI) 40.0 and over, adult: Secondary | ICD-10-CM | POA: Diagnosis not present

## 2020-08-09 DIAGNOSIS — F17210 Nicotine dependence, cigarettes, uncomplicated: Secondary | ICD-10-CM | POA: Diagnosis not present

## 2020-08-09 NOTE — Progress Notes (Signed)
Paramedicine Encounter    [name], female    DOB: 11-12-1980, 39 y.o.   MRN: [id number]   Patient Care Team: Patient, No Pcp Per as PCP - General (General Practice) Regan Lemming, MD as PCP - Electrophysiology (Cardiology) Laurey Morale, MD as PCP - Advanced Heart Failure (Cardiology)  Patient Active Problem List   Diagnosis Date Noted  . Acute on chronic systolic CHF (congestive heart failure) (HCC) 06/17/2019  . Obstructive sleep apnea 06/17/2019  . 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 Diabetes mellitus (HCC) 06/17/2019  . Amenorrhea 08/03/2018  . Congestive heart failure (HCC) 06/29/2018  . Hypotension 06/07/2018  . Rhinovirus infection 06/06/2018  . Asthma 06/05/2018  . Acute respiratory failure with hypoxia (HCC) 06/05/2018  . Hypokalemia 06/05/2018  . Snoring 09/23/2016  . Cough 04/10/2016  . 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 Tobacco abuse 01/25/2016  . Acute on chronic combined systolic and diastolic CHF (congestive heart failure) (HCC) 01/25/2016  . Mitral regurgitation   . Abnormal EKG-inf TWI 01/15/2016  . Positive D dimer-CTA neg for PE 01/15/2016  . Morbid obesity- BMI 57 01/14/2016  . Abdominal pain 01/14/2016  . 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 At risk for sleep apnea 01/14/2016  . Cardiomyopathy (HCC) 11/01/2015  . Morbid obesity with BMI of 50.0-59.9, adult (HCC) 08/16/2015    Current Outpatient Medications:  .  atorvastatin (LIPITOR) 10 MG tablet, Take 1 tablet (10 mg total) by mouth daily. (Patient taking differently: Take 10 mg by mouth daily. ), Disp: 90 tablet, Rfl: 3 .  carvedilol (COREG) 12.5 MG tablet, Take 1 tablet (12.5 mg total) by mouth 2 (two) times daily. Dose increase, Disp: 180 tablet, Rfl: 2 .  digoxin (LANOXIN) 0.125 MG tablet, Take 1 tablet (0.125 mg total) by mouth daily. (Patient taking differently: Take 0.125 mg by mouth daily. ), Disp: 90 tablet, Rfl:  3 .  ivabradine (CORLANOR) 7.5 MG TABS tablet, Take 1 tablet (7.5 mg total) by mouth 2 (two) times daily with a meal., Disp: 60 tablet, Rfl: 3 .  Magnesium Oxide 400 (240 Mg) MG TABS, Take 2 tablets by mouth every morning and 1 tablet by mouth every evening, Disp: 90 tablet, Rfl: 6 .  metolazone (ZAROXOLYN) 2.5 MG tablet, As directed by HF clinic, Disp: 10 tablet, Rfl: 3 .  milrinone (PRIMACOR) 20 MG/100 ML SOLN infusion, Inject 0.0414 mg/min into the vein continuous., Disp:  , Rfl:  .  potassium chloride (KLOR-CON) 10 MEQ tablet, TAKE 8 TABS IN THE MORNING, 6 TABS IN THE AFTERNOON, AND 6 TABS IN THE EVENING, Disp: 600 tablet, Rfl: 3 .  sacubitril-valsartan (ENTRESTO) 49-51 MG, Take 1 tablet by mouth 2 (two) times daily., Disp: 60 tablet, Rfl: 3 .  spironolactone (ALDACTONE) 25 MG tablet, Take 1 tablet (25 mg total) by mouth daily. (Patient taking differently: Take 25 mg by mouth daily. ), Disp: 90 tablet, Rfl: 3 .  torsemide (DEMADEX) 100 MG tablet, Take 1 tablet (100 mg total) by mouth 2 (two) times daily., Disp: 60 tablet, Rfl: 3 .  ipratropium-albuterol (DUONEB) 0.5-2.5 (3) MG/3ML SOLN, Take 3 mLs by nebulization every 4 (four) hours as needed. (Patient not taking: Reported on 08/01/2020), Disp: 360 mL, Rfl: 0 .  PRESCRIPTION MEDICATION, CPAP- AT BEDTIME (Patient not taking: Reported on 08/01/2020), Disp: , Rfl:  Allergies  Allergen Reactions  . Metformin Diarrhea    And caused severe "dry mouth," also  . Metformin And Related Diarrhea    And  caused severe "dry mouth," also      Social History   Socioeconomic History  . Marital status: Single    Spouse name: Not on file  . Number of children: 2  . Years of education: Not on file  . Highest education level: Not on file  Occupational History  . Not on file  Tobacco Use  . Smoking status: Former Smoker    Quit date: 10/19/2015    Years since quitting: 4.8  . Smokeless tobacco: Never Used  Vaping Use  . Vaping Use: Never assessed   Substance and Sexual Activity  . Alcohol use: Yes    Alcohol/week: 0.0 standard drinks    Comment: occasionally  . Drug use: Yes    Types: Marijuana    Comment: only socially  . Sexual activity: Yes  Other Topics Concern  . Not on file  Social History Narrative  . Not on file   Social Determinants of Health   Financial Resource Strain: High Risk  . Difficulty of Paying Living Expenses: Hard  Food Insecurity: Food Insecurity Present  . Worried About Programme researcher, broadcasting/film/video in the Last Year: Sometimes true  . Ran Out of Food in the Last Year: Sometimes true  Transportation Needs: No Transportation Needs  . Lack of Transportation (Medical): No  . Lack of Transportation (Non-Medical): No  Physical Activity:   . Days of Exercise per Week: Not on file  . Minutes of Exercise per Session: Not on file  Stress:   . Feeling of Stress : Not on file  Social Connections:   . Frequency of Communication with Friends and Family: Not on file  . Frequency of Social Gatherings with Friends and Family: Not on file  . Attends Religious Services: Not on file  . Active Member of Clubs or Organizations: Not on file  . Attends Banker Meetings: Not on file  . Marital Status: Not on file  Intimate Partner Violence:   . Fear of Current or Ex-Partner: Not on file  . Emotionally Abused: Not on file  . Physically Abused: Not on file  . Sexually Abused: Not on file    Physical Exam      Future Appointments  Date Time Provider Department Center  08/16/2020  2:50 PM Armando Reichert, CNM [facility] None  08/24/2020  8:50 AM Marcine Matar, MD [facility] None  08/28/2020  1:30 PM [facility] PA/NP [facility] None  10/23/2020  8:35 AM [facility] DEVICE REMOTES [facility]  11/02/2020  1:40 PM Laurey Morale, MD [facility] None  01/22/2021  8:35 AM [facility] DEVICE REMOTES [facility]  04/23/2021  8:35 AM [facility] DEVICE REMOTES [facility]  07/23/2021  8:35  AM [facility] DEVICE REMOTES [facility]  10/22/2021  8:35 AM [facility] DEVICE REMOTES [facility]  01/21/2022  8:35 AM [facility] DEVICE REMOTES [facility]    BP (!) 100/0   Pulse 82   Resp 18   SpO2 98%   Weight today- 358 Weight yesterday-358 Last visit weight-361 [id number]  Pt reports she is doing same. She is now on bubble packs. We still have to do the potassium in the pill box since that wont fit.  She was contacted and referred to PREP program, she is awaiting to hear back on the times that is available that she can go.  She is still out of the insulin. Asked pharmacy to see what is going on with that. He said the medicaid messed up her billing date-he will  get it straight and get a box to her today.  Nurse will be back later today.  meds verified and everything is good.   Kerry Hough, EMT-Paramedic [phone] [facility] Paramedic  08/09/20

## 2020-08-16 ENCOUNTER — Other Ambulatory Visit: Payer: Self-pay

## 2020-08-16 ENCOUNTER — Ambulatory Visit (INDEPENDENT_AMBULATORY_CARE_PROVIDER_SITE_OTHER): Payer: Medicaid Other | Admitting: Advanced Practice Midwife

## 2020-08-16 ENCOUNTER — Encounter: Payer: Self-pay | Admitting: Advanced Practice Midwife

## 2020-08-16 ENCOUNTER — Other Ambulatory Visit (HOSPITAL_COMMUNITY)
Admission: RE | Admit: 2020-08-16 | Discharge: 2020-08-16 | Disposition: A | Discharge status: Home / Self Care | Payer: Medicaid Other | Source: Ambulatory Visit | Attending: Advanced Practice Midwife | Admitting: Advanced Practice Midwife

## 2020-08-16 VITALS — BP 121/79 | HR 85 | Temp 97.7°F | Ht 64.0 in | Wt 365.4 lb

## 2020-08-16 DIAGNOSIS — E119 Type 2 diabetes mellitus without complications: Secondary | ICD-10-CM | POA: Diagnosis not present

## 2020-08-16 DIAGNOSIS — J45909 Unspecified asthma, uncomplicated: Secondary | ICD-10-CM | POA: Diagnosis not present

## 2020-08-16 DIAGNOSIS — Z01419 Encounter for gynecological examination (general) (routine) without abnormal findings: Secondary | ICD-10-CM

## 2020-08-16 DIAGNOSIS — Z124 Encounter for screening for malignant neoplasm of cervix: Secondary | ICD-10-CM | POA: Insufficient documentation

## 2020-08-16 DIAGNOSIS — Z6841 Body Mass Index (BMI) 40.0 and over, adult: Secondary | ICD-10-CM | POA: Diagnosis not present

## 2020-08-16 DIAGNOSIS — Z113 Encounter for screening for infections with a predominantly sexual mode of transmission: Secondary | ICD-10-CM

## 2020-08-16 DIAGNOSIS — I5023 Acute on chronic systolic (congestive) heart failure: Secondary | ICD-10-CM | POA: Diagnosis not present

## 2020-08-16 DIAGNOSIS — Z452 Encounter for adjustment and management of vascular access device: Secondary | ICD-10-CM | POA: Diagnosis not present

## 2020-08-16 DIAGNOSIS — Z3009 Encounter for other general counseling and advice on contraception: Secondary | ICD-10-CM | POA: Diagnosis not present

## 2020-08-16 DIAGNOSIS — I5033 Acute on chronic diastolic (congestive) heart failure: Secondary | ICD-10-CM | POA: Diagnosis not present

## 2020-08-16 DIAGNOSIS — Z7984 Long term (current) use of oral hypoglycemic drugs: Secondary | ICD-10-CM | POA: Diagnosis not present

## 2020-08-16 DIAGNOSIS — I340 Nonrheumatic mitral (valve) insufficiency: Secondary | ICD-10-CM | POA: Diagnosis not present

## 2020-08-16 DIAGNOSIS — F17210 Nicotine dependence, cigarettes, uncomplicated: Secondary | ICD-10-CM | POA: Diagnosis not present

## 2020-08-16 DIAGNOSIS — O903 Peripartum cardiomyopathy: Secondary | ICD-10-CM | POA: Diagnosis not present

## 2020-08-16 LAB — POCT URINE PREGNANCY: Preg Test, Ur: NEGATIVE

## 2020-08-16 NOTE — Addendum Note (Signed)
Addended by: Clovis Pu on: 08/16/2020 04:16 PM   Modules accepted: Orders

## 2020-08-16 NOTE — Progress Notes (Signed)
GYNECOLOGY ANNUAL PREVENTATIVE CARE ENCOUNTER NOTE  Subjective:   Kirsten Wheeler is a 39 y.o. [id number] female here for a routine annual gynecologic exam.  Current complaints: none.   Denies abnormal vaginal bleeding, discharge, pelvic pain, problems with intercourse or other gynecologic concerns.   Patient has CHF and is not a candidate for LVAD or transplant. Her cardiologist and her have decided that it would be best for her to be on birth control at this time.   Patient has PICC line in place for medications for CHF.   Patient had unprotected intercourse on 08/13/2020.    Gynecologic History Patient's last menstrual period was 08/09/2020 (exact date). Cycles are regular  Contraception: none Last Pap: unsure, approx 1+ years ago. Results were: normal per patient.  Last mammogram: NA, age. Results were: NA   Obstetric History OB History  Gravida Para Term Preterm AB Living  6 2 2   4 2   SAB TAB Ectopic Multiple Live Births  3   1   2     # Outcome Date GA Lbr Len/2nd Weight Sex Delivery Anes PTL Lv  6 Term 12/27/08 [redacted]w[redacted]d   F CS-LTranv  N LIV  5 Ectopic 11/15/07          4 SAB 09/17/07          3 SAB 09/16/06          2 SAB 09/16/05          1 Term 10/09/04 [redacted]w[redacted]d   F CS-LTranv   LIV     Complications: Preeclampsia, unspecified trimester    Past Medical History:  Diagnosis Date  . Asthma   . Chronic systolic CHF (congestive heart failure) (HCC)    a. ECHO 01/15/2016 EF 10-15%. 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  . Diabetes mellitus (HCC) 06/17/2019  . Hearing loss    bilateral  . Mitral regurgitation    a. severe, felt to be functional 2/2 LV dilation   . Morbid obesity (HCC)   . Snoring     Past Surgical History:  Procedure Laterality Date  . CARDIAC CATHETERIZATION N/A 01/18/2016   Procedure: Right/Left Heart Cath and Coronary Angiography;  Surgeon: 08/17/2019, MD;  Location: [facility] INVASIVE CV LAB;  Service: Cardiovascular;  Laterality: N/A;  . CESAREAN SECTION     . ICD IMPLANT N/A 04/17/2017   Procedure: ICD Implant;  Surgeon: [facility], MD;  Location: [facility] INVASIVE CV LAB;  Service: Cardiovascular;  Laterality: N/A;  . IR FLUORO GUIDE CV LINE RIGHT  06/23/2019  . IR FLUORO GUIDE CV LINE RIGHT  10/15/2019  . IR REMOVAL TUN CV CATH W/O FL  10/15/2019  . IR 10/17/2019 GUIDE VASC ACCESS RIGHT  06/23/2019  . IR US GUIDE VASC ACCESS RIGHT  10/15/2019  . LEAD REVISION/REPAIR N/A 04/21/2017   Procedure: Lead Revision/Repair;  Surgeon: 10/17/2019, MD;  Location: [facility] INVASIVE CV LAB;  Service: Cardiovascular;  Laterality: N/A;  . RIGHT HEART CATH N/A 06/21/2019   Procedure: RIGHT HEART CATH;  Surgeon: Regan Lemming, MD;  Location: [facility] INVASIVE CV LAB;  Service: Cardiovascular;  Laterality: N/A;  . TONSILLECTOMY    . UNILATERAL SALPINGECTOMY  11/15/2007   unsure of which tube, ectopic pregnancy     Current Outpatient Medications on File Prior to Visit  Medication Sig Dispense Refill  . atorvastatin (LIPITOR) 10 MG tablet Take 1 tablet (10 mg total) by mouth daily. (Patient taking differently: Take 10 mg by mouth daily. )  90 tablet 3  . carvedilol (COREG) 12.5 MG tablet Take 1 tablet (12.5 mg total) by mouth 2 (two) times daily. Dose increase 180 tablet 2  . digoxin (LANOXIN) 0.125 MG tablet Take 1 tablet (0.125 mg total) by mouth daily. (Patient taking differently: Take 0.125 mg by mouth daily. ) 90 tablet 3  . ivabradine (CORLANOR) 7.5 MG TABS tablet Take 1 tablet (7.5 mg total) by mouth 2 (two) times daily with a meal. 60 tablet 3  . Magnesium Oxide 400 (240 Mg) MG TABS Take 2 tablets by mouth every morning and 1 tablet by mouth every evening 90 tablet 6  . metolazone (ZAROXOLYN) 2.5 MG tablet As directed by HF clinic 10 tablet 3  . milrinone (PRIMACOR) 20 MG/100 ML SOLN infusion Inject 0.0414 mg/min into the vein continuous.    . potassium chloride (KLOR-CON) 10 MEQ tablet TAKE 8 TABS IN THE MORNING, 6 TABS IN THE AFTERNOON, AND 6 TABS IN THE EVENING  600 tablet 3  . PRESCRIPTION MEDICATION CPAP- AT BEDTIME    . sacubitril-valsartan (ENTRESTO) 49-51 MG Take 1 tablet by mouth 2 (two) times daily. 60 tablet 3  . spironolactone (ALDACTONE) 25 MG tablet Take 1 tablet (25 mg total) by mouth daily. (Patient taking differently: Take 25 mg by mouth daily. ) 90 tablet 3  . torsemide (DEMADEX) 100 MG tablet Take 1 tablet (100 mg total) by mouth 2 (two) times daily. 60 tablet 3  . ipratropium-albuterol (DUONEB) 0.5-2.5 (3) MG/3ML SOLN Take 3 mLs by nebulization every 4 (four) hours as needed. (Patient not taking: Reported on 08/01/2020) 360 mL 0   No current facility-administered medications on file prior to visit.    Allergies  Allergen Reactions  . Metformin Diarrhea    And caused severe "dry mouth," also  . Metformin And Related Diarrhea    And caused severe "dry mouth," also    Social History   Socioeconomic History  . Marital status: Single    Spouse name: Not on file  . Number of children: 2  . Years of education: Not on file  . Highest education level: Not on file  Occupational History  . Not on file  Tobacco Use  . Smoking status: Current Every Day Smoker    Types: Cigarettes    Last attempt to quit: 10/19/2015    Years since quitting: 4.8  . Smokeless tobacco: Never Used  . Tobacco comment: 3 cigarettes per day  Vaping Use  . Vaping Use: Never used  Substance and Sexual Activity  . Alcohol use: Yes    Alcohol/week: 0.0 standard drinks    Comment: occasionally  . Drug use: Yes    Types: Marijuana    Comment: only socially  . Sexual activity: Yes    Birth control/protection: None  Other Topics Concern  . Not on file  Social History Narrative  . Not on file   Social Determinants of Health   Financial Resource Strain: High Risk  . Difficulty of Paying Living Expenses: Hard  Food Insecurity: Food Insecurity Present  . Worried About Programme researcher, broadcasting/film/video in the Last Year: Sometimes true  . Ran Out of Food in the Last  Year: Sometimes true  Transportation Needs: No Transportation Needs  . Lack of Transportation (Medical): No  . Lack of Transportation (Non-Medical): No  Physical Activity:   . Days of Exercise per Week: Not on file  . Minutes of Exercise per Session: Not on file  Stress:   . Feeling of  Stress : Not on file  Social Connections:   . Frequency of Communication with Friends and Family: Not on file  . Frequency of Social Gatherings with Friends and Family: Not on file  . Attends Religious Services: Not on file  . Active Member of Clubs or Organizations: Not on file  . Attends Banker Meetings: Not on file  . Marital Status: Not on file  Intimate Partner Violence:   . Fear of Current or Ex-Partner: Not on file  . Emotionally Abused: Not on file  . Physically Abused: Not on file  . Sexually Abused: Not on file    Family History  Problem Relation Age of Onset  . Heart attack Mother   . Asthma Mother   . Heart failure Mother   . Hypertension Father   . Diabetes Brother     The following portions of the patient's history were reviewed and updated as appropriate: allergies, current medications, past family history, past medical history, past social history, past surgical history and problem list.  Review of Systems Pertinent items noted in HPI and remainder of comprehensive ROS otherwise negative.   Objective:  BP 121/79 (BP Location: Left Wrist, Patient Position: Sitting, Cuff Size: Normal)   Pulse 85   Temp 97.7 F (36.5 C) (Oral)   Ht 5\' 4"  (1.626 m)   Wt (!) 365 lb 6.4 oz (165.7 kg)   LMP 08/09/2020 (Exact Date)   BMI 62.72 kg/m  CONSTITUTIONAL: Well-developed, well-nourished female in no acute distress.  HENT:  Normocephalic, atraumatic, External right and left ear normal. Oropharynx is clear and moist EYES: Conjunctivae and EOM are normal. Pupils are equal, round, and reactive to light. No scleral icterus.  NECK: Normal range of motion, supple, no masses.   Normal thyroid.  SKIN: Skin is warm and dry. No rash noted. Not diaphoretic. No erythema. No pallor. NEUROLOGIC: Alert and oriented to person, place, and time. Normal reflexes, muscle tone coordination. No cranial nerve deficit noted. PSYCHIATRIC: Normal mood and affect. Normal behavior. Normal judgment and thought content. CARDIOVASCULAR: Normal heart rate noted, regular rhythm RESPIRATORY: Clear to auscultation bilaterally. Effort and breath sounds normal, no problems with respiration noted. BREASTS: Symmetric in size. No masses, skin changes, nipple drainage, or lymphadenopathy. ABDOMEN: Soft, normal bowel sounds, no distention noted.  No tenderness, rebound or guarding.  PELVIC: Normal appearing external genitalia; normal appearing vaginal mucosa and cervix.  No abnormal discharge noted.  Pap smear obtained.  Normal uterine size, no other palpable masses, no uterine or adnexal tenderness. MUSCULOSKELETAL: Normal range of motion. No tenderness.  No cyanosis, clubbing, or edema.  2+ distal pulses.   Assessment and Plan:  1. Screen for STD (sexually transmitted disease) - Cervicovaginal ancillary only( [place]) - HIV Antibody (routine testing w rflx) - RPR  2. Pap smear for cervical cancer screening - Cytology - PAP( [place])  3. Women's annual routine gynecological examination - Cytology - PAP( [place]) - Cervicovaginal ancillary only( [place]) - HIV Antibody (routine testing w rflx) - RPR   Patient has had unprotected intercourse, and is ok with waiting 2 weeks for the IUD insertion. Will have her return in 2 weeks for IUD insertion.   Will follow up results of pap smear and manage accordingly. Routine preventative health maintenance measures emphasized. Please refer to After Visit Summary for other counseling recommendations.    08/11/2020 DNP, CNM  08/16/20  3:27 PM

## 2020-08-17 DIAGNOSIS — F331 Major depressive disorder, recurrent, moderate: Secondary | ICD-10-CM | POA: Diagnosis not present

## 2020-08-17 LAB — CERVICOVAGINAL ANCILLARY ONLY
Bacterial Vaginitis (gardnerella): POSITIVE — AB
Candida Glabrata: NEGATIVE
Candida Vaginitis: NEGATIVE
Chlamydia: NEGATIVE
Comment: NEGATIVE
Comment: NEGATIVE
Comment: NEGATIVE
Comment: NEGATIVE
Comment: NEGATIVE
Comment: NORMAL
Neisseria Gonorrhea: NEGATIVE
Trichomonas: NEGATIVE

## 2020-08-17 LAB — RPR: RPR Ser Ql: NONREACTIVE

## 2020-08-17 LAB — HIV ANTIBODY (ROUTINE TESTING W REFLEX): HIV Screen 4th Generation wRfx: NONREACTIVE

## 2020-08-18 LAB — CYTOLOGY - PAP
Comment: NEGATIVE
Diagnosis: NEGATIVE
High risk HPV: NEGATIVE

## 2020-08-21 ENCOUNTER — Other Ambulatory Visit (HOSPITAL_COMMUNITY): Payer: Self-pay

## 2020-08-21 ENCOUNTER — Telehealth (HOSPITAL_COMMUNITY): Payer: Self-pay | Admitting: Licensed Clinical Social Worker

## 2020-08-21 NOTE — Progress Notes (Signed)
Paramedicine Encounter    [name], female    DOB: [birth year], 39 y.o.   MRN: [id number]   Patient Care Team: Patient, No Pcp Per as PCP - General (General Practice) Regan Lemming, MD as PCP - Electrophysiology (Cardiology) Laurey Morale, MD as PCP - Advanced Heart Failure (Cardiology)  Patient Active Problem List   Diagnosis Date Noted  . Acute on chronic systolic CHF (congestive heart failure) (HCC) 06/17/2019  . Obstructive sleep apnea 06/17/2019  . 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 Diabetes mellitus (HCC) 06/17/2019  . Amenorrhea 08/03/2018  . Congestive heart failure (HCC) 06/29/2018  . Hypotension 06/07/2018  . Rhinovirus infection 06/06/2018  . Asthma 06/05/2018  . Acute respiratory failure with hypoxia (HCC) 06/05/2018  . Hypokalemia 06/05/2018  . Snoring 09/23/2016  . Cough 04/10/2016  . 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 Tobacco abuse 01/25/2016  . Acute on chronic combined systolic and diastolic CHF (congestive heart failure) (HCC) 01/25/2016  . Mitral regurgitation   . Abnormal EKG-inf TWI 01/15/2016  . Positive D dimer-CTA neg for PE 01/15/2016  . Morbid obesity- BMI 57 01/14/2016  . Abdominal pain 01/14/2016  . 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 At risk for sleep apnea 01/14/2016  . Cardiomyopathy (HCC) 11/01/2015  . Morbid obesity with BMI of 50.0-59.9, adult (HCC) 08/16/2015    Current Outpatient Medications:  .  atorvastatin (LIPITOR) 10 MG tablet, Take 1 tablet (10 mg total) by mouth daily. (Patient taking differently: Take 10 mg by mouth daily. ), Disp: 90 tablet, Rfl: 3 .  carvedilol (COREG) 12.5 MG tablet, Take 1 tablet (12.5 mg total) by mouth 2 (two) times daily. Dose increase, Disp: 180 tablet, Rfl: 2 .  digoxin (LANOXIN) 0.125 MG tablet, Take 1 tablet (0.125 mg total) by mouth daily. (Patient taking differently: Take 0.125 mg by mouth daily. ), Disp: 90 tablet, Rfl:  3 .  ipratropium-albuterol (DUONEB) 0.5-2.5 (3) MG/3ML SOLN, Take 3 mLs by nebulization every 4 (four) hours as needed. (Patient not taking: Reported on 08/01/2020), Disp: 360 mL, Rfl: 0 .  ivabradine (CORLANOR) 7.5 MG TABS tablet, Take 1 tablet (7.5 mg total) by mouth 2 (two) times daily with a meal., Disp: 60 tablet, Rfl: 3 .  Magnesium Oxide 400 (240 Mg) MG TABS, Take 2 tablets by mouth every morning and 1 tablet by mouth every evening, Disp: 90 tablet, Rfl: 6 .  metolazone (ZAROXOLYN) 2.5 MG tablet, As directed by HF clinic, Disp: 10 tablet, Rfl: 3 .  milrinone (PRIMACOR) 20 MG/100 ML SOLN infusion, Inject 0.0414 mg/min into the vein continuous., Disp:  , Rfl:  .  potassium chloride (KLOR-CON) 10 MEQ tablet, TAKE 8 TABS IN THE MORNING, 6 TABS IN THE AFTERNOON, AND 6 TABS IN THE EVENING, Disp: 600 tablet, Rfl: 3 .  PRESCRIPTION MEDICATION, CPAP- AT BEDTIME, Disp: , Rfl:  .  sacubitril-valsartan (ENTRESTO) 49-51 MG, Take 1 tablet by mouth 2 (two) times daily., Disp: 60 tablet, Rfl: 3 .  spironolactone (ALDACTONE) 25 MG tablet, Take 1 tablet (25 mg total) by mouth daily. (Patient taking differently: Take 25 mg by mouth daily. ), Disp: 90 tablet, Rfl: 3 .  torsemide (DEMADEX) 100 MG tablet, Take 1 tablet (100 mg total) by mouth 2 (two) times daily., Disp: 60 tablet, Rfl: 3 Allergies  Allergen Reactions  . Metformin Diarrhea    And caused severe "dry mouth," also  . Metformin And Related Diarrhea    And caused severe "dry mouth," also  Social History   Socioeconomic History  . Marital status: Single    Spouse name: Not on file  . Number of children: 2  . Years of education: Not on file  . Highest education level: Not on file  Occupational History  . Not on file  Tobacco Use  . Smoking status: Current Every Day Smoker    Types: Cigarettes    Last attempt to quit: 10/19/2015    Years since quitting: 4.8  . Smokeless tobacco: Never Used  . Tobacco comment: 3 cigarettes per day   Vaping Use  . Vaping Use: Never used  Substance and Sexual Activity  . Alcohol use: Yes    Alcohol/week: 0.0 standard drinks    Comment: occasionally  . Drug use: Yes    Types: Marijuana    Comment: only socially  . Sexual activity: Yes    Birth control/protection: None  Other Topics Concern  . Not on file  Social History Narrative  . Not on file   Social Determinants of Health   Financial Resource Strain: High Risk  . Difficulty of Paying Living Expenses: Hard  Food Insecurity: Food Insecurity Present  . Worried About Programme researcher, broadcasting/film/video in the Last Year: Sometimes true  . Ran Out of Food in the Last Year: Sometimes true  Transportation Needs: No Transportation Needs  . Lack of Transportation (Medical): No  . Lack of Transportation (Non-Medical): No  Physical Activity:   . Days of Exercise per Week: Not on file  . Minutes of Exercise per Session: Not on file  Stress:   . Feeling of Stress : Not on file  Social Connections:   . Frequency of Communication with Friends and Family: Not on file  . Frequency of Social Gatherings with Friends and Family: Not on file  . Attends Religious Services: Not on file  . Active Member of Clubs or Organizations: Not on file  . Attends Banker Meetings: Not on file  . Marital Status: Not on file  Intimate Partner Violence:   . Fear of Current or Ex-Partner: Not on file  . Emotionally Abused: Not on file  . Physically Abused: Not on file  . Sexually Abused: Not on file    Physical Exam      Future Appointments  Date Time Provider Department Center  08/24/2020  8:50 AM Marcine Matar, MD [facility] None  08/28/2020  1:30 PM [facility] PA/NP [facility] None  09/13/2020  9:10 AM Thressa Sheller D, CNM [facility] None  10/23/2020  8:35 AM [facility] DEVICE REMOTES [facility]  11/02/2020  1:40 PM Laurey Morale, MD [facility] None  01/22/2021  8:35 AM [facility] DEVICE REMOTES [facility]  04/23/2021   8:35 AM [facility] DEVICE REMOTES [facility]  07/23/2021  8:35 AM [facility] DEVICE REMOTES [facility]  10/22/2021  8:35 AM [facility] DEVICE REMOTES [facility]  01/21/2022  8:35 AM [facility] DEVICE REMOTES [facility]    BP (!) 90/0   Pulse 88   Resp 20   LMP 08/09/2020 (Exact Date)   SpO2 98%   Weight yesterday-358 Last visit weight-358  Pt reports she has been feeling ok.  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 After she rested it did resolve. She hasnt felt that since.  She had gyno initial visit last week, she liked them.  She reports pharmacy did not deliver her bubble packs. Sent them a message to get  those out here. She did have extra pack of meds from the change of moving to bubble packs so she has been taking that.  She has not heard back from prep program to get a day scheduled.  She denies increased sob. Weight at a standstill.  Denies dizziness. No issues with PICC line or pump.  Pharmacy will deliver her meds today.  She sees PCP on Thursday.    Kerry Hough, EMT-Paramedic [phone] [facility] Paramedic  08/21/20

## 2020-08-21 NOTE — Telephone Encounter (Signed)
Pt came in to complete [employer] program application for energy assistance.  Completed and CSW submitted to DSS for review.  CSW also inquired about pt going to [employer] for utility assistance- reports she still has not gone- CSW reiterated the importance of this as she will likely face turn off soon if she doesn't pay her bill.  CSW also encouraged pt to call DSS regarding [facility] program as it has been 30 days since we applied- reports she will do this soon.  Will continue to follow and assist as needed  Burna Sis, LCSW Clinical Social Worker [facility] Desk#: [phone] Cell#: [phone]

## 2020-08-22 MED ORDER — METRONIDAZOLE 500 MG PO TABS: 500.0000 mg | ORAL_TABLET | Freq: Two times a day (BID) | ORAL | 0 refills | Status: DC

## 2020-08-22 NOTE — Addendum Note (Signed)
Addended by: Thressa Sheller D on: 08/22/2020 01:18 PM   Modules accepted: Orders

## 2020-08-23 DIAGNOSIS — E119 Type 2 diabetes mellitus without complications: Secondary | ICD-10-CM | POA: Diagnosis not present

## 2020-08-23 DIAGNOSIS — O903 Peripartum cardiomyopathy: Secondary | ICD-10-CM | POA: Diagnosis not present

## 2020-08-23 DIAGNOSIS — Z6841 Body Mass Index (BMI) 40.0 and over, adult: Secondary | ICD-10-CM | POA: Diagnosis not present

## 2020-08-23 DIAGNOSIS — I340 Nonrheumatic mitral (valve) insufficiency: Secondary | ICD-10-CM | POA: Diagnosis not present

## 2020-08-23 DIAGNOSIS — Z7984 Long term (current) use of oral hypoglycemic drugs: Secondary | ICD-10-CM | POA: Diagnosis not present

## 2020-08-23 DIAGNOSIS — I5023 Acute on chronic systolic (congestive) heart failure: Secondary | ICD-10-CM | POA: Diagnosis not present

## 2020-08-23 DIAGNOSIS — Z452 Encounter for adjustment and management of vascular access device: Secondary | ICD-10-CM | POA: Diagnosis not present

## 2020-08-23 DIAGNOSIS — J45909 Unspecified asthma, uncomplicated: Secondary | ICD-10-CM | POA: Diagnosis not present

## 2020-08-23 DIAGNOSIS — F17210 Nicotine dependence, cigarettes, uncomplicated: Secondary | ICD-10-CM | POA: Diagnosis not present

## 2020-08-24 ENCOUNTER — Ambulatory Visit: Payer: Medicaid Other | Attending: Internal Medicine | Admitting: Internal Medicine

## 2020-08-24 ENCOUNTER — Other Ambulatory Visit: Payer: Self-pay

## 2020-08-24 ENCOUNTER — Encounter: Payer: Self-pay | Admitting: Internal Medicine

## 2020-08-24 DIAGNOSIS — Z95810 Presence of automatic (implantable) cardiac defibrillator: Secondary | ICD-10-CM | POA: Diagnosis not present

## 2020-08-24 DIAGNOSIS — Z8249 Family history of ischemic heart disease and other diseases of the circulatory system: Secondary | ICD-10-CM | POA: Diagnosis not present

## 2020-08-24 DIAGNOSIS — I340 Nonrheumatic mitral (valve) insufficiency: Secondary | ICD-10-CM | POA: Diagnosis not present

## 2020-08-24 DIAGNOSIS — F419 Anxiety disorder, unspecified: Secondary | ICD-10-CM | POA: Insufficient documentation

## 2020-08-24 DIAGNOSIS — I5022 Chronic systolic (congestive) heart failure: Secondary | ICD-10-CM | POA: Insufficient documentation

## 2020-08-24 DIAGNOSIS — Z794 Long term (current) use of insulin: Secondary | ICD-10-CM

## 2020-08-24 DIAGNOSIS — E1169 Type 2 diabetes mellitus with other specified complication: Secondary | ICD-10-CM | POA: Diagnosis not present

## 2020-08-24 DIAGNOSIS — G4733 Obstructive sleep apnea (adult) (pediatric): Secondary | ICD-10-CM | POA: Insufficient documentation

## 2020-08-24 DIAGNOSIS — F32A Depression, unspecified: Secondary | ICD-10-CM | POA: Diagnosis not present

## 2020-08-24 DIAGNOSIS — F17200 Nicotine dependence, unspecified, uncomplicated: Secondary | ICD-10-CM | POA: Insufficient documentation

## 2020-08-24 DIAGNOSIS — Z79899 Other long term (current) drug therapy: Secondary | ICD-10-CM | POA: Diagnosis not present

## 2020-08-24 DIAGNOSIS — Z2821 Immunization not carried out because of patient refusal: Secondary | ICD-10-CM | POA: Diagnosis not present

## 2020-08-24 DIAGNOSIS — I428 Other cardiomyopathies: Secondary | ICD-10-CM | POA: Insufficient documentation

## 2020-08-24 DIAGNOSIS — Z6841 Body Mass Index (BMI) 40.0 and over, adult: Secondary | ICD-10-CM | POA: Insufficient documentation

## 2020-08-24 DIAGNOSIS — E0801 Diabetes mellitus due to underlying condition with hyperosmolarity with coma: Secondary | ICD-10-CM

## 2020-08-24 DIAGNOSIS — F17210 Nicotine dependence, cigarettes, uncomplicated: Secondary | ICD-10-CM | POA: Insufficient documentation

## 2020-08-24 LAB — POCT GLYCOSYLATED HEMOGLOBIN (HGB A1C): HbA1c, POC (controlled diabetic range): 8.7 % — AB (ref 0.0–7.0)

## 2020-08-24 LAB — GLUCOSE, POCT (MANUAL RESULT ENTRY): POC Glucose: 170 mg/dL — AB (ref 70–99)

## 2020-08-24 MED ORDER — DAPAGLIFLOZIN PROPANEDIOL 5 MG PO TABS: 5.0000 mg | ORAL_TABLET | Freq: Every day | ORAL | 2 refills | Status: DC

## 2020-08-24 MED ORDER — NICOTINE POLACRILEX 2 MG MT GUM: 2.0000 mg | CHEWING_GUM | OROMUCOSAL | 2 refills | Status: DC | PRN

## 2020-08-24 NOTE — Progress Notes (Signed)
[name], female    DOB: [birth year]  MRN: [id number]  CC: New Patient (Initial Visit)   Subjective: Kirsten Wheeler is a 39 y.o. female who presents for new patient visit to establish care. Her concerns today include:  Patient with history of NICM with ICD, chronic systolic CHF end-stage on milrinone infusion once a week as a bridge to LVAD (not a candidate for transplant due to her weight), MR moderate, asthma, OSA on CPAP, tobacco dependence, morbid obesity, DM  Previous PCP was with Nelly Rout with [employer].  Last seen 6-12 mths ago.  Decide to change because it was too far from her house and office wait was too long.   OSA:  Dx in 2018.  Not using CPAP because the wire on the back of it broke.  Company was planning to pick up because she was not using it consistently even when it worked.  Did not like taking off on putting mask back on constantly during the night when she has to get up to urinate. She also felt some anxiety at times when she was wearing the mask. She found the whole process of strapping the mask on to be a hassle.   End stage CHF/NICM:  Reports going up down stairs and takes walks daily around her apartment complex -no CP.Breathing "not bad."  Has to take break if walking up hill No LE edema at this time.  No PND/orthopnea Reports compliance with meds.  Takes Metolazone on Wed only. -Limits salt "somewhat."   Tob Dep:  Smokes 2 cigarettes a day down from 1 pk/day.  Smoked off and on since age 17.  Quit for for a while but now trying to quit again.  "Out of sight out of mind helps me."  Usually wants to smoke early morning.  Never tried nicotine gum or patches. Willing to try the gum.  DM:  Currently on Trulicity x 6 mths that was prescribed by her previous PCP. Reports dec appetite on med.  No N/V/abdominal pain.  Does not eat red meat.  Saw nutritionist in past.  Cardiologist is setting her up with [facility] program where she will see nutritionist  and start exercise classes.  Checking BS once a day to QOD; usually in a.m or after lunch.  Range 230s in a.m Results for orders placed or performed in visit on 08/24/20  POCT glucose (manual entry)  Result Value Ref Range   POC Glucose 170 (A) 70 - 99 mg/dl  POCT glycosylated hemoglobin (Hb A1C)  Result Value Ref Range   Hemoglobin A1C     HbA1c POC (<> result, manual entry)     HbA1c, POC (prediabetic range)     HbA1c, POC (controlled diabetic range) 8.7 (A) 0.0 - 7.0 %     Depression/Anxiety: Gives long history of depression and anxiety. Sees a therapist wkly for about 3 yrs.  Contributing factors are "overall health and life period." Does not like being in large crowd.  Does not like going to [employer] No SI.   Afraid to take meds to help with depression anxiety. Feels she is on too many meds already.  Past medical, social, family history and surgical history reviewed. Patient Active Problem List   Diagnosis Date Noted  . Influenza vaccine refused 08/24/2020  . Acute on chronic systolic CHF (congestive heart failure) (HCC) 06/17/2019  . Obstructive sleep apnea 06/17/2019  . 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 Diabetes mellitus (HCC) 06/17/2019  .  Amenorrhea 08/03/2018  . Congestive heart failure (HCC) 06/29/2018  . Hypotension 06/07/2018  . Rhinovirus infection 06/06/2018  . Asthma 06/05/2018  . Hypokalemia 06/05/2018  . Snoring 09/23/2016  . Cough 04/10/2016  . 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 Tobacco abuse 01/25/2016  . Acute on chronic combined systolic and diastolic CHF (congestive heart failure) (HCC) 01/25/2016  . Mitral regurgitation   . Abnormal EKG-inf TWI 01/15/2016  . Positive D dimer-CTA neg for PE 01/15/2016  . Morbid obesity- BMI 57 01/14/2016  . Abdominal pain 01/14/2016  . 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 At risk for sleep apnea 01/14/2016  . Cardiomyopathy (HCC) 11/01/2015  . Morbid obesity  with BMI of 50.0-59.9, adult (HCC) 08/16/2015     Current Outpatient Medications on File Prior to Visit  Medication Sig Dispense Refill  . metroNIDAZOLE (FLAGYL) 500 MG tablet Take 1 tablet (500 mg total) by mouth 2 (two) times daily. 14 tablet 0  . atorvastatin (LIPITOR) 10 MG tablet Take 1 tablet (10 mg total) by mouth daily. (Patient taking differently: Take 10 mg by mouth daily. ) 90 tablet 3  . carvedilol (COREG) 12.5 MG tablet Take 1 tablet (12.5 mg total) by mouth 2 (two) times daily. Dose increase 180 tablet 2  . digoxin (LANOXIN) 0.125 MG tablet Take 1 tablet (0.125 mg total) by mouth daily. (Patient taking differently: Take 0.125 mg by mouth daily.) 90 tablet 3  . Dulaglutide (TRULICITY) 1.5 MG/0.5ML SOPN Inject 1.5 mg into the skin once a week.    Marland Kitchen ipratropium-albuterol (DUONEB) 0.5-2.5 (3) MG/3ML SOLN Take 3 mLs by nebulization every 4 (four) hours as needed. (Patient not taking: Reported on 08/01/2020) 360 mL 0  . ivabradine (CORLANOR) 7.5 MG TABS tablet Take 1 tablet (7.5 mg total) by mouth 2 (two) times daily with a meal. 60 tablet 3  . Magnesium Oxide 400 (240 Mg) MG TABS Take 2 tablets by mouth every morning and 1 tablet by mouth every evening 90 tablet 6  . metolazone (ZAROXOLYN) 2.5 MG tablet As directed by HF clinic 10 tablet 3  . milrinone (PRIMACOR) 20 MG/100 ML SOLN infusion Inject 0.0414 mg/min into the vein continuous.    . potassium chloride (KLOR-CON) 10 MEQ tablet TAKE 8 TABS IN THE MORNING, 6 TABS IN THE AFTERNOON, AND 6 TABS IN THE EVENING 600 tablet 3  . PRESCRIPTION MEDICATION CPAP- AT BEDTIME    . sacubitril-valsartan (ENTRESTO) 49-51 MG Take 1 tablet by mouth 2 (two) times daily. 60 tablet 3  . spironolactone (ALDACTONE) 25 MG tablet Take 1 tablet (25 mg total) by mouth daily. (Patient taking differently: Take 25 mg by mouth daily. ) 90 tablet 3  . torsemide (DEMADEX) 100 MG tablet Take 1 tablet (100 mg total) by mouth 2 (two) times daily. 60 tablet 3   No  current facility-administered medications on file prior to visit.    Allergies  Allergen Reactions  . Metformin Diarrhea    And caused severe "dry mouth," also  . Metformin And Related Diarrhea    And caused severe "dry mouth," also    Social History   Socioeconomic History  . Marital status: Single    Spouse name: Not on file  . Number of children: 2  . Years of education: Not on file  . Highest education level: Not on file  Occupational History  . Not on file  Tobacco Use  . Smoking status: Current Every Day Smoker    Types: Cigarettes    Last attempt  to quit: 10/19/2015    Years since quitting: 4.8  . Smokeless tobacco: Never Used  . Tobacco comment: 3 cigarettes per day  Vaping Use  . Vaping Use: Never used  Substance and Sexual Activity  . Alcohol use: Yes    Alcohol/week: 0.0 standard drinks    Comment: occasionally  . Drug use: Yes    Types: Marijuana    Comment: only socially  . Sexual activity: Yes    Birth control/protection: None  Other Topics Concern  . Not on file  Social History Narrative  . Not on file   Social Determinants of Health   Financial Resource Strain: High Risk  . Difficulty of Paying Living Expenses: Hard  Food Insecurity: Food Insecurity Present  . Worried About Programme researcher, broadcasting/film/video in the Last Year: Sometimes true  . Ran Out of Food in the Last Year: Sometimes true  Transportation Needs: No Transportation Needs  . Lack of Transportation (Medical): No  . Lack of Transportation (Non-Medical): No  Physical Activity: Not on file  Stress: Not on file  Social Connections: Not on file  Intimate Partner Violence: Not on file    Family History  Problem Relation Age of Onset  . Heart attack Mother   . Asthma Mother   . Heart failure Mother   . Hypertension Father   . Diabetes Brother     Past Surgical History:  Procedure Laterality Date  . CARDIAC CATHETERIZATION N/A 01/18/2016   Procedure: Right/Left Heart Cath and Coronary  Angiography;  Surgeon: Laurey Morale, MD;  Location: [facility] INVASIVE CV LAB;  Service: Cardiovascular;  Laterality: N/A;  . CESAREAN SECTION    . ICD IMPLANT N/A 04/17/2017   Procedure: ICD Implant;  Surgeon: Regan Lemming, MD;  Location: [facility] INVASIVE CV LAB;  Service: Cardiovascular;  Laterality: N/A;  . IR FLUORO GUIDE CV LINE RIGHT  06/23/2019  . IR FLUORO GUIDE CV LINE RIGHT  10/15/2019  . IR REMOVAL TUN CV CATH W/O FL  10/15/2019  . IR US GUIDE VASC ACCESS RIGHT  06/23/2019  . IR US GUIDE VASC ACCESS RIGHT  10/15/2019  . LEAD REVISION/REPAIR N/A 04/21/2017   Procedure: Lead Revision/Repair;  Surgeon: Regan Lemming, MD;  Location: [facility] INVASIVE CV LAB;  Service: Cardiovascular;  Laterality: N/A;  . RIGHT HEART CATH N/A 06/21/2019   Procedure: RIGHT HEART CATH;  Surgeon: Laurey Morale, MD;  Location: [facility] INVASIVE CV LAB;  Service: Cardiovascular;  Laterality: N/A;  . TONSILLECTOMY    . UNILATERAL SALPINGECTOMY  11/15/2007   unsure of which tube, ectopic pregnancy     ROS: Review of Systems Negative except as stated above  PHYSICAL EXAM: BP 103/71   Pulse 86   Temp 98 F (36.7 C)   Resp 16   Ht 5\' 4"  (1.626 m)   Wt (!) 353 lb 3.2 oz (160.2 kg)   LMP 08/09/2020 (Exact Date)   SpO2 96%   BMI 60.63 kg/m   Wt Readings from Last 3 Encounters:  08/24/20 (!) 353 lb 3.2 oz (160.2 kg)  08/16/20 (!) 365 lb 6.4 oz (165.7 kg)  07/31/20 (!) 361 lb (163.7 kg)    Physical Exam  General appearance - alert, well appearing, and in no distress Mental status - normal mood, behavior, speech, dress, motor activity, and thought processes Eyes - pupils equal and reactive, extraocular eye movements intact Nose - normal and patent, no erythema, discharge or polyps Mouth - mucous membranes moist, pharynx normal without lesions  Neck - supple, no significant adenopathy Chest - clear to auscultation, no wheezes, rales or rhonchi, symmetric air entry Heart -heart sounds decreased on auscultation  but regular. Extremities -no lower extremity edema Diabetic Foot Exam - Simple   Simple Foot Form Visual Inspection No deformities, no ulcerations, no other skin breakdown bilaterally: Yes Sensation Testing Intact to touch and monofilament testing bilaterally: Yes Pulse Check Posterior Tibialis and Dorsalis pulse intact bilaterally: Yes Comments     Depression screen PHQ 2/9 08/24/2020  Decreased Interest 3  Down, Depressed, Hopeless 1  PHQ - 2 Score 4  Altered sleeping 3  Tired, decreased energy 3  Change in appetite 3  Feeling bad or failure about yourself  1  Trouble concentrating 0  Moving slowly or fidgety/restless 2  Suicidal thoughts 0  PHQ-9 Score 16  Some recent data might be hidden   GAD 7 : Generalized Anxiety Score 08/24/2020  Nervous, Anxious, on Edge 2  Control/stop worrying 2  Worry too much - different things 3  Trouble relaxing 2  Restless 0  Easily annoyed or irritable 2  Afraid - awful might happen 2  Total GAD 7 Score 13     CMP Latest Ref Rng & Units 06/13/2020 05/17/2020 11/23/2019  Glucose 70 - 99 mg/dL 161(W) 960(A) 540(J)  BUN 6 - 20 mg/dL Creatinine 0.44 - 1.00 mg/dL 8.11 9.14 7.82  Sodium 135 - 145 mmol/L 136 134(L) 137  Potassium 3.5 - 5.1 mmol/L 4.0 2.8(L) 3.8  Chloride 98 - 111 mmol/L 102 93(L) 103  CO2 22 - 32 mmol/L Calcium 8.9 - 10.3 mg/dL 9.5(A) 9.6 9.5  Total Protein 6.5 - 8.1 g/dL - - -  Total Bilirubin 0.3 - 1.2 mg/dL - - -  Alkaline Phos 38 - 126 U/L - - -  AST 15 - 41 U/L - - -  ALT 0 - 44 U/L - - -   Lipid Panel     Component Value Date/Time   CHOL 89 06/17/2019 1830   TRIG 121 06/17/2019 1830   HDL 24 (L) 06/17/2019 1830   CHOLHDL 3.7 06/17/2019 1830   VLDL 24 06/17/2019 1830   LDLCALC 41 06/17/2019 1830    CBC    Component Value Date/Time   WBC 9.1 11/23/2019 1101   RBC 5.03 11/23/2019 1101   HGB 13.6 11/23/2019 1101   HCT 40.7 11/23/2019 1101   PLT 253 11/23/2019 1101   MCV 80.9 11/23/2019  1101   MCH 27.0 11/23/2019 1101   MCHC 33.4 11/23/2019 1101   RDW 13.7 11/23/2019 1101   LYMPHSABS 1.8 06/27/2019 2228   MONOABS 0.6 06/27/2019 2228   EOSABS 0.1 06/27/2019 2228   BASOSABS 0.0 06/27/2019 2228    ASSESSMENT AND PLAN: 1. Type 2 diabetes mellitus with morbid obesity (HCC) Her A1c has improved from previous levels in the system. However she is still not at goal. Commended her on trying to move more as much as her heart condition would allow. Discussed and encourage healthy eating habits. Printed information also given. Informed her about medical weight management program but she will hold off on that for now as she states that her cardiologist has referred her to a program through the [facility] that will help keep her encourage with regular exercise and nutrition counseling -Add low-dose [place]. Continue Trulicity which she has been taking - POCT glucose (manual entry) - POCT glycosylated hemoglobin (Hb A1C) - Microalbumin / creatinine urine ratio - dapagliflozin  propanediol (FARXIGA) 5 MG TABS tablet; Take 1 tablet (5 mg total) by mouth daily before breakfast.  Dispense: 30 tablet; Refill: 2 - Ambulatory referral to Ophthalmology - CBC - Comprehensive metabolic panel - Lipid panel  2. OSA (obstructive sleep apnea) Discussed how untreated sleep apnea can make CHF worse. Encourage compliance with the CPAP machine. She is agreeable to being referred for mask desensitization. I have told her in the meantime she should call her medical supplier and let them know that we are doing the mask desensitization in hopes that she would start using the machine regularly and asked that the machine be repaired in particular the broken cord. She is agreeable to this. - Desensitization mask fit; Future  3. Tobacco dependence Commended her on cutting back and trying to quit. Discussed health risks associated with smoking. Encouraged her to set a quit date. Prescription sent to her pharmacy for  the nicotine patches. Less than 5 minutes spent on counseling.  4. Anxiety and depression Discussed management of depression and anxiety. She is currently seeing a therapist. We discussed putting her on a medication to treat both depression and anxiety. Patient declines at this time. If she changes her mind she will let me know.  5. NICM (nonischemic cardiomyopathy) (HCC) 6. Chronic systolic CHF (congestive heart failure) (HCC) Followed by cardiology. She is also plugged in with the paramedic at home CHF program  7. Influenza vaccine refused Recommended. Patient declined  8. 23-polyvalent pneumococcal polysaccharide vaccine declined Recommended. Patient declined     Patient was given the opportunity to ask questions.  Patient verbalized understanding of the plan and was able to repeat key elements of the plan.   Orders Placed This Encounter  Procedures  . Microalbumin / creatinine urine ratio  . CBC  . Comprehensive metabolic panel  . Lipid panel  . Ambulatory referral to Ophthalmology  . POCT glucose (manual entry)  . POCT glycosylated hemoglobin (Hb A1C)  . Desensitization mask fit     Requested Prescriptions   Signed Prescriptions Disp Refills  . dapagliflozin propanediol (FARXIGA) 5 MG TABS tablet 30 tablet 2    Sig: Take 1 tablet (5 mg total) by mouth daily before breakfast.    Return in about 3 months (around 11/22/2020).  Jonah Blue, MD, FACP

## 2020-08-24 NOTE — Patient Instructions (Addendum)
Please call your medical supplier and let them know that you have been referred for mask desensitization.  Let them know that your CPAP machine is in need of repair.  We have started a new diabetes medication called Farxiga 5 mg for you to take daily.  If ever you become dehydrated or have viral illness where you are having vomiting and diarrhea, you should hold the Farxiga until the episode resolves.  Continue Trulicity.  We have referred you for your eye exam.   Diabetes Mellitus and Standards of Medical Care Managing diabetes (diabetes mellitus) can be complicated. Your diabetes treatment may be managed by a team of health care providers, including:  A physician who specializes in diabetes (endocrinologist).  A nurse practitioner or physician assistant.  Nurses.  A diet and nutrition specialist (registered dietitian).  A certified diabetes educator (CDE).  An exercise specialist.  A pharmacist.  An eye doctor.  A foot specialist (podiatrist).  A dentist.  A primary care provider.  A mental health provider. Your health care providers follow guidelines to help you get the best quality of care. The following schedule is a general guideline for your diabetes management plan. Your health care providers may give you more specific instructions. Physical exams Upon being diagnosed with diabetes mellitus, and each year after that, your health care provider will ask about your medical and family history. He or she will also do a physical exam. Your exam may include:  Measuring your height, weight, and body mass index (BMI).  Checking your blood pressure. This will be done at every routine medical visit. Your target blood pressure may vary depending on your medical conditions, your age, and other factors.  Thyroid gland exam.  Skin exam.  Screening for damage to your nerves (peripheral neuropathy). This may include checking the pulse in your legs and feet and checking the level  of sensation in your hands and feet.  A complete foot exam to inspect the structure and skin of your feet, including checking for cuts, bruises, redness, blisters, sores, or other problems.  Screening for blood vessel (vascular) problems, which may include checking the pulse in your legs and feet and checking your temperature. Blood tests Depending on your treatment plan and your personal needs, you may have the following tests done:  HbA1c (hemoglobin A1c). This test provides information about blood sugar (glucose) control over the previous 2-3 months. It is used to adjust your treatment plan, if needed. This test will be done: ? At least 2 times a year, if you are meeting your treatment goals. ? 4 times a year, if you are not meeting your treatment goals or if treatment goals have changed.  Lipid testing, including total, LDL, and HDL cholesterol and triglyceride levels. ? The goal for LDL is less than 100 mg/dL (5.5 mmol/L). If you are at high risk for complications, the goal is less than 70 mg/dL (3.9 mmol/L). ? The goal for HDL is 40 mg/dL (2.2 mmol/L) or higher for men and 50 mg/dL (2.8 mmol/L) or higher for women. An HDL cholesterol of 60 mg/dL (3.3 mmol/L) or higher gives some protection against heart disease. ? The goal for triglycerides is less than 150 mg/dL (8.3 mmol/L).  Liver function tests.  Kidney function tests.  Thyroid function tests. Dental and eye exams  Visit your dentist two times a year.  If you have type 1 diabetes, your health care provider may recommend an eye exam 3-5 years after you are diagnosed, and then  once a year after your first exam. ? For children with type 1 diabetes, a health care provider may recommend an eye exam when your child is age 20 or older and has had diabetes for 3-5 years. After the first exam, your child should get an eye exam once a year.  If you have type 2 diabetes, your health care provider may recommend an eye exam as soon as you  are diagnosed, and then once a year after your first exam. Immunizations   The yearly flu (influenza) vaccine is recommended for everyone 6 months or older who has diabetes.  The pneumonia (pneumococcal) vaccine is recommended for everyone 2 years or older who has diabetes. If you are 64 or older, you may get the pneumonia vaccine as a series of two separate shots.  The hepatitis B vaccine is recommended for adults shortly after being diagnosed with diabetes.  Adults and children with diabetes should receive all other vaccines according to age-specific recommendations from the Centers for Disease Control and Prevention (CDC). Mental and emotional health Screening for symptoms of eating disorders, anxiety, and depression is recommended at the time of diagnosis and afterward as needed. If your screening shows that you have symptoms (positive screening result), you may need more evaluation and you may work with a mental health care provider. Treatment plan Your treatment plan will be reviewed at every medical visit. You and your health care provider will discuss:  How you are taking your medicines, including insulin.  Any side effects you are experiencing.  Your blood glucose target goals.  The frequency of your blood glucose monitoring.  Lifestyle habits, such as activity level as well as tobacco, alcohol, and substance use. Diabetes self-management education Your health care provider will assess how well you are monitoring your blood glucose levels and whether you are taking your insulin correctly. He or she may refer you to:  A certified diabetes educator to manage your diabetes throughout your life, starting at diagnosis.  A registered dietitian who can create or review your personal nutrition plan.  An exercise specialist who can discuss your activity level and exercise plan. Summary  Managing diabetes (diabetes mellitus) can be complicated. Your diabetes treatment may be managed  by a team of health care providers.  Your health care providers follow guidelines in order to help you get the best quality of care.  Standards of care including having regular physical exams, blood tests, blood pressure monitoring, immunizations, screening tests, and education about how to manage your diabetes.  Your health care providers may also give you more specific instructions based on your individual health. This information is not intended to replace advice given to you by your health care provider. Make sure you discuss any questions you have with your health care provider. Document Revised: 05/22/2018 Document Reviewed: 05/31/2016 Elsevier Patient Education  2020 [employer].

## 2020-08-25 ENCOUNTER — Telehealth (HOSPITAL_COMMUNITY): Payer: Self-pay

## 2020-08-25 LAB — COMPREHENSIVE METABOLIC PANEL
ALT: 22 IU/L (ref 0–32)
AST: 18 IU/L (ref 0–40)
Albumin/Globulin Ratio: 1.1 — ABNORMAL LOW (ref 1.2–2.2)
Albumin: 4.4 g/dL (ref 3.8–4.8)
Alkaline Phosphatase: 74 IU/L (ref 44–121)
BUN/Creatinine Ratio: 15 (ref 9–23)
BUN: 16 mg/dL (ref 6–20)
Bilirubin Total: 0.8 mg/dL (ref 0.0–1.2)
CO2: 28 mmol/L (ref 20–29)
Calcium: 10.3 mg/dL — ABNORMAL HIGH (ref 8.7–10.2)
Chloride: 92 mmol/L — ABNORMAL LOW (ref 96–106)
Creatinine, Ser: 1.09 mg/dL — ABNORMAL HIGH (ref 0.57–1.00)
GFR calc Af Amer: 75 mL/min/{1.73_m2} (ref >59)
GFR calc non Af Amer: 65 mL/min/{1.73_m2} (ref >59)
Globulin, Total: 3.9 g/dL (ref 1.5–4.5)
Glucose: 139 mg/dL — ABNORMAL HIGH (ref 65–99)
Potassium: 3.5 mmol/L (ref 3.5–5.2)
Sodium: 136 mmol/L (ref 134–144)
Total Protein: 8.3 g/dL (ref 6.0–8.5)

## 2020-08-25 LAB — CBC
Hematocrit: 46.5 % (ref 34.0–46.6)
Hemoglobin: 15.8 g/dL (ref 11.1–15.9)
MCH: 27.4 pg (ref 26.6–33.0)
MCHC: 34 g/dL (ref 31.5–35.7)
MCV: 81 fL (ref 79–97)
Platelets: 333 10*3/uL (ref 150–450)
RBC: 5.77 x10E6/uL — ABNORMAL HIGH (ref 3.77–5.28)
RDW: 14.2 % (ref 11.7–15.4)
WBC: 12.4 10*3/uL — ABNORMAL HIGH (ref 3.4–10.8)

## 2020-08-25 LAB — LIPID PANEL
Chol/HDL Ratio: 3.8 ratio (ref 0.0–4.4)
Cholesterol, Total: 144 mg/dL (ref 100–199)
HDL: 38 mg/dL — ABNORMAL LOW (ref >39)
LDL Chol Calc (NIH): 75 mg/dL (ref 0–99)
Triglycerides: 181 mg/dL — ABNORMAL HIGH (ref 0–149)
VLDL Cholesterol Cal: 31 mg/dL (ref 5–40)

## 2020-08-25 LAB — MICROALBUMIN / CREATININE URINE RATIO
Creatinine, Urine: 85.6 mg/dL
Microalb/Creat Ratio: 6 mg/g creat (ref 0–29)
Microalbumin, Urine: 5 ug/mL

## 2020-08-25 NOTE — Telephone Encounter (Signed)
Appointment reminder call placed. Patient aware. 

## 2020-08-28 ENCOUNTER — Other Ambulatory Visit (HOSPITAL_COMMUNITY): Payer: Self-pay

## 2020-08-28 ENCOUNTER — Ambulatory Visit (HOSPITAL_COMMUNITY)
Admission: RE | Admit: 2020-08-28 | Discharge: 2020-08-28 | Disposition: A | Discharge status: Home / Self Care | Payer: Medicaid Other | Source: Ambulatory Visit | Attending: Adult Health | Admitting: Adult Health

## 2020-08-28 ENCOUNTER — Other Ambulatory Visit: Payer: Self-pay

## 2020-08-28 ENCOUNTER — Encounter (HOSPITAL_COMMUNITY): Payer: Self-pay

## 2020-08-28 VITALS — BP 106/74 | HR 94 | Wt 356.0 lb

## 2020-08-28 DIAGNOSIS — I5022 Chronic systolic (congestive) heart failure: Secondary | ICD-10-CM | POA: Insufficient documentation

## 2020-08-28 DIAGNOSIS — Z6841 Body Mass Index (BMI) 40.0 and over, adult: Secondary | ICD-10-CM | POA: Diagnosis not present

## 2020-08-28 DIAGNOSIS — G4733 Obstructive sleep apnea (adult) (pediatric): Secondary | ICD-10-CM | POA: Insufficient documentation

## 2020-08-28 DIAGNOSIS — E119 Type 2 diabetes mellitus without complications: Secondary | ICD-10-CM | POA: Insufficient documentation

## 2020-08-28 DIAGNOSIS — Z79899 Other long term (current) drug therapy: Secondary | ICD-10-CM | POA: Diagnosis not present

## 2020-08-28 DIAGNOSIS — N926 Irregular menstruation, unspecified: Secondary | ICD-10-CM | POA: Diagnosis not present

## 2020-08-28 DIAGNOSIS — G4730 Sleep apnea, unspecified: Secondary | ICD-10-CM | POA: Diagnosis not present

## 2020-08-28 DIAGNOSIS — I471 Supraventricular tachycardia: Secondary | ICD-10-CM | POA: Insufficient documentation

## 2020-08-28 DIAGNOSIS — Z720 Tobacco use: Secondary | ICD-10-CM

## 2020-08-28 DIAGNOSIS — Z7984 Long term (current) use of oral hypoglycemic drugs: Secondary | ICD-10-CM | POA: Diagnosis not present

## 2020-08-28 DIAGNOSIS — Z8249 Family history of ischemic heart disease and other diseases of the circulatory system: Secondary | ICD-10-CM | POA: Insufficient documentation

## 2020-08-28 DIAGNOSIS — I428 Other cardiomyopathies: Secondary | ICD-10-CM | POA: Diagnosis not present

## 2020-08-28 DIAGNOSIS — F17210 Nicotine dependence, cigarettes, uncomplicated: Secondary | ICD-10-CM | POA: Diagnosis not present

## 2020-08-28 DIAGNOSIS — Z95810 Presence of automatic (implantable) cardiac defibrillator: Secondary | ICD-10-CM | POA: Diagnosis not present

## 2020-08-28 LAB — PREGNANCY, URINE: Preg Test, Ur: NEGATIVE

## 2020-08-28 NOTE — Progress Notes (Signed)
Advanced Heart Failure Clinic Note   Referring Physician:PCP: Marcine Matar, MD PCP-Cardiologist: Dr. Megan Mans GYN : Dr Mathews Robinsons.   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   She presented to the ER on 06/17/19 with several days of worsening HF with SOB at rest, marked edema and orthopnea/PND, not responding to increased doses of oral diuretics. She admitted to high levels of water and Gatorade intake. Her admit wt was 380 lb (Dry wt ~360 lb). She was started on IV Lasix but had initial poor urinary response. PO metolazone added as adjunct. Echo showed EF <20%, relatively normal RV, moderate MR. RHC 06/21/19 with low output (CI 1.8) and elevated PCWP but near normal RA pressure. She was started on inotropic therapy w/ milrinone for low output, 0.25 mcg/kg/min. Also treated w/ Entresto, Coreg, spironolactone, digoxin and ivabradine. She was later transitioned off of IV Lasix to po torsemide 80 mg qam/60 mg qpm.  Unfortunately, she was not a transplant candidate due to her morbid obesity. We discussed her at [facility] with Dr. Donata Clay. She will need significant weight loss to get to LVAD. Will use home milrinone to try to facilitate increased activity to work towards weight loss. She understands that milrinone is not a good end-point and that we would like to use it as a bridge to eventual LVAD. RV function seems adequate for LVAD. Tunneled catheter was placed for home milrinone and home health was arranged. She was discharged home on 06/24/20 on milrinone 0.25 mcg/kg/min.   06/27/19 she presented to the [facility] w/ CC of palpitations. Was brought in by EMS and was reportedly in SVT with HR in the 180s. She was given adenosine and converted to NSR prior to arrival to the ED.  PICC exchanged 10/15/19  Today she returns for HF follow up.Overall feeling fair. Complaining of nausea today. She is asking for a pregnancy test.  She is sexually active. Not using CPAP because its broken.  Denies SOB/PND/Orthopnea. Appetite ok. No fever or chills. She has not been weighing at home. Says she has been taking her medications with the bubble packs. Smoking 1-3 cigarettes per day.  Followed by HF Paramedicine.   Labs (8/21): K 4.4, creatinine 0.9, hgb 14.4 Labs (06/13/20): K 4 Creatinine 0.97   PMH:  1. Chronic systolic CHF: Nonischemic cardiomyopathy.  No family history of cardiomyopathy, no heavy ETOH, cocaine, or amphetamines.  Medtronic ICD.  - Echo (12/16) with EF 20-25%, moderate MR.  - Echo (5/17) with severely dilated LV, EF 10-15%, severe functional MR, mildly decreased RV systolic function. - LHC/RHC (5/97): No significant coronary disease; mean RA 15, PA 61/25 mean 41, mean PCWP 18, CI 1.7 (Fick), PVR 5.4 WU.  - Echo (10/17): EF 20%, severely dilated LV, normal RV size with mildly decreased systolic function, moderate-severe central MR.  - Echo 04/13/17  LVEF 15-20%, Severe LV dilation, Mod LAE, Mild RAE - CPX (5/19): RER 0.95, peak VO2 9.8, VE/VCO2 26 => submaximal but likely severe functional impairement.  - Echo (7/19): EF 20-25%, moderate LV dilation, RV normal size with mildly decreased systolic function, moderate central MR.  - Echo (10/20): EF < 20%, severe LV enlargement, normal RV>  - RHC (10/20): mean RA 7, PA 65/25, mean PCWP 27, CI 1.8, PVR 3.87, PAPI 5.7.  2. Morbid obesity.  3. 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  Echo (10/17) with moderate-severe MR. Echo (7/19) with  moderate central MR.  4. OSA: CPAP.  5. SVT  Current Outpatient Medications  Medication Sig Dispense Refill  . atorvastatin (LIPITOR) 10 MG tablet Take 1 tablet (10 mg total) by mouth daily. (Patient taking differently: Take 10 mg by mouth daily.) 90 tablet 3  . carvedilol (COREG) 12.5 MG tablet Take 1 tablet (12.5 mg total) by mouth 2 (two) times daily. Dose increase 180 tablet 2  . digoxin (LANOXIN) 0.125 MG tablet  Take 1 tablet (0.125 mg total) by mouth daily. (Patient taking differently: Take 0.125 mg by mouth daily.) 90 tablet 3  . Dulaglutide (TRULICITY) 1.5 MG/0.5ML SOPN Inject 1.5 mg into the skin once a week.    Marland Kitchen ipratropium-albuterol (DUONEB) 0.5-2.5 (3) MG/3ML SOLN Take 3 mLs by nebulization every 4 (four) hours as needed. 360 mL 0  . ivabradine (CORLANOR) 7.5 MG TABS tablet Take 1 tablet (7.5 mg total) by mouth 2 (two) times daily with a meal. 60 tablet 3  . Magnesium Oxide 400 (240 Mg) MG TABS Take 2 tablets by mouth every morning and 1 tablet by mouth every evening 90 tablet 6  . metolazone (ZAROXOLYN) 2.5 MG tablet As directed by HF clinic 10 tablet 3  . metroNIDAZOLE (FLAGYL) 500 MG tablet Take 1 tablet (500 mg total) by mouth 2 (two) times daily. 14 tablet 0  . milrinone (PRIMACOR) 20 MG/100 ML SOLN infusion Inject 0.0414 mg/min into the vein continuous.    . nicotine polacrilex (NICORETTE) 2 MG gum Take 1 each (2 mg total) by mouth as needed for smoking cessation. 100 tablet 2  . potassium chloride (KLOR-CON) 10 MEQ tablet TAKE 8 TABS IN THE MORNING, 6 TABS IN THE AFTERNOON, AND 6 TABS IN THE EVENING 600 tablet 3  . PRESCRIPTION MEDICATION CPAP- AT BEDTIME    . sacubitril-valsartan (ENTRESTO) 49-51 MG Take 1 tablet by mouth 2 (two) times daily. 60 tablet 3  . spironolactone (ALDACTONE) 25 MG tablet Take 1 tablet (25 mg total) by mouth daily. (Patient taking differently: Take 25 mg by mouth daily.) 90 tablet 3  . torsemide (DEMADEX) 100 MG tablet Take 1 tablet (100 mg total) by mouth 2 (two) times daily. 60 tablet 3  . dapagliflozin propanediol (FARXIGA) 5 MG TABS tablet Take 1 tablet (5 mg total) by mouth daily before breakfast. (Patient not taking: Reported on 08/28/2020) 30 tablet 2   No current facility-administered medications for this encounter.    Allergies  Allergen Reactions  . Metformin Diarrhea    And caused severe "dry mouth," also  . Metformin And Related Diarrhea    And  caused severe "dry mouth," also      Social History   Socioeconomic History  . Marital status: Single    Spouse name: Not on file  . Number of children: 2  . Years of education: Not on file  . Highest education level: Not on file  Occupational History  . Not on file  Tobacco Use  . Smoking status: Current Every Day Smoker    Types: Cigarettes    Last attempt to quit: 10/19/2015    Years since quitting: 4.8  . Smokeless tobacco: Never Used  . Tobacco comment: 3 cigarettes per day  Vaping Use  . Vaping Use: Never used  Substance and Sexual Activity  . Alcohol use: Yes    Alcohol/week: 0.0 standard drinks    Comment: occasionally  . Drug use: Yes    Types: Marijuana    Comment: only socially  . Sexual activity:  Yes    Birth control/protection: None  Other Topics Concern  . Not on file  Social History Narrative  . Not on file   Social Determinants of Health   Financial Resource Strain: High Risk  . Difficulty of Paying Living Expenses: Hard  Food Insecurity: Food Insecurity Present  . Worried About Programme researcher, broadcasting/film/video in the Last Year: Sometimes true  . Ran Out of Food in the Last Year: Sometimes true  Transportation Needs: No Transportation Needs  . Lack of Transportation (Medical): No  . Lack of Transportation (Non-Medical): No  Physical Activity: Not on file  Stress: Not on file  Social Connections: Not on file  Intimate Partner Violence: Not on file      Family History  Problem Relation Age of Onset  . Heart attack Mother   . Asthma Mother   . Heart failure Mother   . Hypertension Father   . Diabetes Brother     Vitals:   08/28/20 1355  BP: 106/74  Pulse: 94  SpO2: 97%  Weight: (!) 161.5 kg (356 lb)   Wt Readings from Last 3 Encounters:  08/28/20 (!) 161.5 kg (356 lb)  08/24/20 (!) 160.2 kg (353 lb 3.2 oz)  08/16/20 (!) 165.7 kg (365 lb 6.4 oz)   General:  No resp difficulty HEENT: normal Neck: supple. no JVD. Carotids 2+ bilat; no bruits. No  lymphadenopathy or thryomegaly appreciated. Cor: PMI nondisplaced. Regular rate & rhythm. No rubs, gallops or murmurs. R upper chest tunneled catheter.  Lungs: clear Abdomen: soft, nontender, nondistended. No hepatosplenomegaly. No bruits or masses. Good bowel sounds. Extremities: no cyanosis, clubbing, rash, edema Neuro: alert & orientedx3, cranial nerves grossly intact. moves all 4 extremities w/o difficulty. Affect pleasant   ASSESSMENT & PLAN: 1. Chronic systolic CHF:  Nonischemic cardiomyopathy.  Body habitus precludes cardiac MRI.  Echo 06/2019 with EF < 20%, relatively normal RV, moderate MR.  RHC 06/20/20 with low output (CI 1.8) and elevated PCWP but near normal RA pressure, started on milrinone 0.25 with plans to continue home milrinone as bridge to possible LVAD if she could achieve significant weight loss.  Unfortunately, she has not succeeded in losing any weight.  -NYHA II. Volume status stable. Continue torsemide 100 mg twice a day.   - Remains on milrinone.   - Continue Coreg 12.5 mg bid.   - Continue Entresto to 49/51 bid  - Continue ivabradine to 7.5 mg twice a day.   - Continue spironolactone 25 daily.  - Continue digoxin 0.125 mg -Continue farxiga 10 mg daily  - Discussed that she needs to prevent pregnancy. We have discussed the fetotoxic risks of her cardiac meds were she to become pregnant.  - Pregnancy test negative.  -  VAD consideration: Her size and social support will be issues.  She would not be a transplant candidate at this point due to weight. We discussed her at [facility], Dr. Donata Clay has seen.  She will need significant weight loss to get to LVAD.  Have been using home milrinone to try to facilitate increased activity to work towards weight loss.  She understands that milrinone is not a good end-point and that we would like to use it as a bridge to eventual LVAD. RV function seems adequate for LVAD.  However, she has made no progress towards weight loss.  Continue HF  Paramedicine.  2. Morbid obesity:  Body mass index is 61.11 kg/m. - Turned down for bariatric surgery due to insurance.  -  Discussed portion control.  3. SVT: had ED visit 10/11 for SVT noted by EMS that converted back to NSR after dose of adenosine. This was in the setting of hypokalemia and hypomagnesemia, at 3.0 and 1.5 respectively. Also in the setting of inotropic therapy w/ milrinone.   4. OSA: Needs to get CPAP fixed. We discussed.  5. DMII:  Hgb A1C 8.7  She was started on farxiga per PCP.   Pregnancy test negative today.   Follow up in 4-6 weeks. Continue HF paramedicine.  Greater than 50% of the (total minutes 40) visit spent in counseling/coordination of care regarding the above. Tonye Becket, NP 08/28/20

## 2020-08-28 NOTE — Patient Instructions (Signed)
Your physician recommends that you schedule a follow-up appointment in: 6 weeks  If you have any questions or concerns before your next appointment please send [place] a message through [place] or call our office at [phone].    TO LEAVE A MESSAGE FOR THE NURSE SELECT OPTION 2, PLEASE LEAVE A MESSAGE INCLUDING: . YOUR NAME . DATE OF BIRTH . CALL BACK NUMBER . REASON FOR CALL**this is important as we prioritize the call backs  YOU WILL RECEIVE A CALL BACK THE SAME DAY AS LONG AS YOU CALL BEFORE 4:00 PM

## 2020-08-28 NOTE — Progress Notes (Signed)
Paramedicine Encounter   [name] , female,   DOB: 09-18-1980,39 y.o.,  MRN: [id number]   Met patient in clinic today with provider.  B/p-106/74 p-94 sp02-97% Weight yesterday-35 Last visit weight-358 Weight @ clinic-356  Pt reports she is not feeling good. She reports feeling nauseous and had vomited this morning. She had unprotected sex several wks ago AMA and warnings and advised her to wait until she got IUD. She is worried she could be pregnant. No diarrhea.  urine specimen collected today in clinic.  She states she did take her AM meds today.  Her PCP wanted her to start on farxiga. The pharmacy has not delivered to her yet.  Also she is sending her to get another sleep study test.  Urine test came back during visit and it was negative. However it still may be a little early for that to be detected.  I will get her to try an OTC test each week until her appoint on 12/29 for IUD. We have asked her to abstain from actions until then as it would be very risky for her to become pregnant and much harm/death to baby.    [name], [facility] Paramedic  08/28/20

## 2020-08-29 ENCOUNTER — Encounter (HOSPITAL_COMMUNITY): Payer: Self-pay | Admitting: *Deleted

## 2020-08-29 ENCOUNTER — Telehealth: Payer: Self-pay

## 2020-08-29 NOTE — Progress Notes (Signed)
Received signed ROI from SS disability office requesting records from 03/29/2019 to present. Records faxed to them at [phone]

## 2020-08-29 NOTE — Telephone Encounter (Signed)
Marcelline Deist PA approved until 08/29/21

## 2020-08-30 DIAGNOSIS — I5023 Acute on chronic systolic (congestive) heart failure: Secondary | ICD-10-CM | POA: Diagnosis not present

## 2020-08-30 DIAGNOSIS — I340 Nonrheumatic mitral (valve) insufficiency: Secondary | ICD-10-CM | POA: Diagnosis not present

## 2020-08-30 DIAGNOSIS — Z452 Encounter for adjustment and management of vascular access device: Secondary | ICD-10-CM | POA: Diagnosis not present

## 2020-08-30 DIAGNOSIS — Z6841 Body Mass Index (BMI) 40.0 and over, adult: Secondary | ICD-10-CM | POA: Diagnosis not present

## 2020-08-30 DIAGNOSIS — O903 Peripartum cardiomyopathy: Secondary | ICD-10-CM | POA: Diagnosis not present

## 2020-08-30 DIAGNOSIS — F17210 Nicotine dependence, cigarettes, uncomplicated: Secondary | ICD-10-CM | POA: Diagnosis not present

## 2020-08-30 DIAGNOSIS — Z7984 Long term (current) use of oral hypoglycemic drugs: Secondary | ICD-10-CM | POA: Diagnosis not present

## 2020-08-30 DIAGNOSIS — E119 Type 2 diabetes mellitus without complications: Secondary | ICD-10-CM | POA: Diagnosis not present

## 2020-08-30 DIAGNOSIS — J45909 Unspecified asthma, uncomplicated: Secondary | ICD-10-CM | POA: Diagnosis not present

## 2020-09-05 ENCOUNTER — Telehealth: Payer: Self-pay

## 2020-09-05 NOTE — Telephone Encounter (Signed)
Call placed to patient reference next PREP class starting 09/18/20 10am-1115am every Monday/Wednesday x 12 wks At [facility] to starting then. Intake appt scheduled for 09/12/20 at 11am.

## 2020-09-06 ENCOUNTER — Other Ambulatory Visit (HOSPITAL_COMMUNITY): Payer: Self-pay

## 2020-09-06 ENCOUNTER — Telehealth (HOSPITAL_COMMUNITY): Payer: Self-pay | Admitting: Licensed Clinical Social Worker

## 2020-09-06 DIAGNOSIS — Z6841 Body Mass Index (BMI) 40.0 and over, adult: Secondary | ICD-10-CM | POA: Diagnosis not present

## 2020-09-06 DIAGNOSIS — O903 Peripartum cardiomyopathy: Secondary | ICD-10-CM | POA: Diagnosis not present

## 2020-09-06 DIAGNOSIS — J45909 Unspecified asthma, uncomplicated: Secondary | ICD-10-CM | POA: Diagnosis not present

## 2020-09-06 DIAGNOSIS — Z452 Encounter for adjustment and management of vascular access device: Secondary | ICD-10-CM | POA: Diagnosis not present

## 2020-09-06 DIAGNOSIS — E119 Type 2 diabetes mellitus without complications: Secondary | ICD-10-CM | POA: Diagnosis not present

## 2020-09-06 DIAGNOSIS — Z7984 Long term (current) use of oral hypoglycemic drugs: Secondary | ICD-10-CM | POA: Diagnosis not present

## 2020-09-06 DIAGNOSIS — I5023 Acute on chronic systolic (congestive) heart failure: Secondary | ICD-10-CM | POA: Diagnosis not present

## 2020-09-06 DIAGNOSIS — F17210 Nicotine dependence, cigarettes, uncomplicated: Secondary | ICD-10-CM | POA: Diagnosis not present

## 2020-09-06 DIAGNOSIS — I340 Nonrheumatic mitral (valve) insufficiency: Secondary | ICD-10-CM | POA: Diagnosis not present

## 2020-09-06 NOTE — Progress Notes (Signed)
Paramedicine Encounter    [name], female    DOB: 10-10-1980, 39 y.o.   MRN: [id number]   Patient Care Team: Marcine Matar, MD as PCP - General (Internal Medicine) Regan Lemming, MD as PCP - Electrophysiology (Cardiology) Laurey Morale, MD as PCP - Advanced Heart Failure (Cardiology)  Patient Active Problem List   Diagnosis Date Noted  . Influenza vaccine refused 08/24/2020  . Anxiety and depression 08/24/2020  . 23-polyvalent pneumococcal polysaccharide vaccine declined 08/24/2020  . Tobacco dependence 08/24/2020  . Acute on chronic systolic CHF (congestive heart failure) (HCC) 06/17/2019  . Obstructive sleep apnea 06/17/2019  . 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 Diabetes mellitus (HCC) 06/17/2019  . Amenorrhea 08/03/2018  . Congestive heart failure (HCC) 06/29/2018  . Hypotension 06/07/2018  . Rhinovirus infection 06/06/2018  . Asthma 06/05/2018  . Hypokalemia 06/05/2018  . Snoring 09/23/2016  . Cough 04/10/2016  . 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 Tobacco abuse 01/25/2016  . Acute on chronic combined systolic and diastolic CHF (congestive heart failure) (HCC) 01/25/2016  . Mitral regurgitation   . Abnormal EKG-inf TWI 01/15/2016  . Positive D dimer-CTA neg for PE 01/15/2016  . Morbid obesity- BMI 57 01/14/2016  . Abdominal pain 01/14/2016  . 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 At risk for sleep apnea 01/14/2016  . Cardiomyopathy (HCC) 11/01/2015  . Morbid obesity with BMI of 50.0-59.9, adult (HCC) 08/16/2015    Current Outpatient Medications:  .  atorvastatin (LIPITOR) 10 MG tablet, Take 1 tablet (10 mg total) by mouth daily. (Patient taking differently: Take 10 mg by mouth daily.), Disp: 90 tablet, Rfl: 3 .  carvedilol (COREG) 12.5 MG tablet, Take 1 tablet (12.5 mg total) by mouth 2 (two) times daily. Dose increase, Disp: 180 tablet, Rfl: 2 .  digoxin (LANOXIN) 0.125 MG tablet,  Take 1 tablet (0.125 mg total) by mouth daily. (Patient taking differently: Take 0.125 mg by mouth daily.), Disp: 90 tablet, Rfl: 3 .  Dulaglutide (TRULICITY) 1.5 MG/0.5ML SOPN, Inject 1.5 mg into the skin once a week., Disp: , Rfl:  .  ipratropium-albuterol (DUONEB) 0.5-2.5 (3) MG/3ML SOLN, Take 3 mLs by nebulization every 4 (four) hours as needed., Disp: 360 mL, Rfl: 0 .  ivabradine (CORLANOR) 7.5 MG TABS tablet, Take 1 tablet (7.5 mg total) by mouth 2 (two) times daily with a meal., Disp: 60 tablet, Rfl: 3 .  Magnesium Oxide 400 (240 Mg) MG TABS, Take 2 tablets by mouth every morning and 1 tablet by mouth every evening, Disp: 90 tablet, Rfl: 6 .  metolazone (ZAROXOLYN) 2.5 MG tablet, As directed by HF clinic, Disp: 10 tablet, Rfl: 3 .  metroNIDAZOLE (FLAGYL) 500 MG tablet, Take 1 tablet (500 mg total) by mouth 2 (two) times daily., Disp: 14 tablet, Rfl: 0 .  milrinone (PRIMACOR) 20 MG/100 ML SOLN infusion, Inject 0.0414 mg/min into the vein continuous., Disp:  , Rfl:  .  potassium chloride (KLOR-CON) 10 MEQ tablet, TAKE 8 TABS IN THE MORNING, 6 TABS IN THE AFTERNOON, AND 6 TABS IN THE EVENING, Disp: 600 tablet, Rfl: 3 .  sacubitril-valsartan (ENTRESTO) 49-51 MG, Take 1 tablet by mouth 2 (two) times daily., Disp: 60 tablet, Rfl: 3 .  spironolactone (ALDACTONE) 25 MG tablet, Take 1 tablet (25 mg total) by mouth daily. (Patient taking differently: Take 25 mg by mouth daily.), Disp: 90 tablet, Rfl: 3 .  torsemide (DEMADEX) 100 MG tablet, Take 1 tablet (100 mg total) by mouth 2 (two)  times daily., Disp: 60 tablet, Rfl: 3 .  dapagliflozin propanediol (FARXIGA) 5 MG TABS tablet, Take 1 tablet (5 mg total) by mouth daily before breakfast. (Patient not taking: Reported on 09/06/2020), Disp: 30 tablet, Rfl: 2 .  nicotine polacrilex (NICORETTE) 2 MG gum, Take 1 each (2 mg total) by mouth as needed for smoking cessation. (Patient not taking: Reported on 09/06/2020), Disp: 100 tablet, Rfl: 2 .  PRESCRIPTION  MEDICATION, CPAP- AT BEDTIME (Patient not taking: Reported on 09/06/2020), Disp: , Rfl:  Allergies  Allergen Reactions  . Metformin Diarrhea    And caused severe "dry mouth," also  . Metformin And Related Diarrhea    And caused severe "dry mouth," also      Social History   Socioeconomic History  . Marital status: Single    Spouse name: Not on file  . Number of children: 2  . Years of education: Not on file  . Highest education level: Not on file  Occupational History  . Not on file  Tobacco Use  . Smoking status: Current Every Day Smoker    Types: Cigarettes    Last attempt to quit: 10/19/2015    Years since quitting: 4.8  . Smokeless tobacco: Never Used  . Tobacco comment: 3 cigarettes per day  Vaping Use  . Vaping Use: Never used  Substance and Sexual Activity  . Alcohol use: Yes    Alcohol/week: 0.0 standard drinks    Comment: occasionally  . Drug use: Yes    Types: Marijuana    Comment: only socially  . Sexual activity: Yes    Birth control/protection: None  Other Topics Concern  . Not on file  Social History Narrative  . Not on file   Social Determinants of Health   Financial Resource Strain: High Risk  . Difficulty of Paying Living Expenses: Hard  Food Insecurity: Food Insecurity Present  . Worried About Programme researcher, broadcasting/film/video in the Last Year: Sometimes true  . Ran Out of Food in the Last Year: Sometimes true  Transportation Needs: No Transportation Needs  . Lack of Transportation (Medical): No  . Lack of Transportation (Non-Medical): No  Physical Activity: Not on file  Stress: Not on file  Social Connections: Not on file  Intimate Partner Violence: Not on file    Physical Exam      Future Appointments  Date Time Provider Department Center  09/13/2020  9:10 AM Armando Reichert, CNM [facility] None  10/23/2020  8:35 AM [facility] DEVICE REMOTES [facility]  11/02/2020 11:20 AM Laurey Morale, MD [facility] None  11/02/2020  1:40 PM  Laurey Morale, MD [facility] None  11/24/2020  1:30 PM Marcine Matar, MD [facility] None  01/22/2021  8:35 AM [facility] DEVICE REMOTES [facility]  04/23/2021  8:35 AM [facility] DEVICE REMOTES [facility]  07/23/2021  8:35 AM [facility] DEVICE REMOTES [facility]  10/22/2021  8:35 AM [facility] DEVICE REMOTES [facility]  01/21/2022  8:35 AM [facility] DEVICE REMOTES [facility]    BP (!) 96/0   Pulse 86   Temp 99.6 F (37.6 C)   Resp 20   LMP 08/09/2020 (Exact Date)   SpO2 98%  Weight today-359 Weight yesterday-356 Last visit weight-358  Pt reports feeling ok. She reports her car is not working. She isnt sure what is wrong with it. Thinks it was the transmission. Looks like her insurance just approved the farxiga.  She started off doing good with her meds on the bubble packs and  got off track with them but feels she is back on track now.  She is still taking potassium but sometimes doesn't take the extra with metolazone-she had muscle cramps last week.  She is not started the farxiga yet--it will be placed in her next bubble pack.  She is on week 3. I will get pharmacy to place it in next set of bubble packs.  Bubble pack of meds verified.  Not able to use her CPAP-she was non-compliant before but now she said the bunny rabbit chewed the cord and its frayed.  She is up 3 lbs overnight but today is her metolazone day and she reports she hasnt started urinating yet.  She ate out at [employer] last night. So I suspect that plays a part in her weight gain.  She reports her breathing is good.  She does have some swelling to her feet/ankles.  She has heard back from prep program and will be starting in January. She is worried about transportation there so [place] said she could use [facility] to get there and back.  Nurse is coming today as well.   Kerry Hough, EMT-Paramedic [phone] [facility] Paramedic   09/06/20

## 2020-09-06 NOTE — Telephone Encounter (Signed)
Pt car currently broken and concerned about getting to appts and upcoming PREP program.  Pt enrolled with [facility] and informed how to call in rides.  No further needs at this time  Will continue to follow and assist as needed  Burna Sis, LCSW Clinical Social Worker Advanced Heart Failure Clinic Desk#: [phone] Cell#: [phone]

## 2020-09-12 ENCOUNTER — Telehealth (HOSPITAL_COMMUNITY): Payer: Self-pay

## 2020-09-12 ENCOUNTER — Telehealth: Payer: Self-pay

## 2020-09-12 NOTE — Telephone Encounter (Signed)
Patient called to cancel her 09/13/2020 appt due to head cold. Patient stated they will call back to reschedule.

## 2020-09-12 NOTE — Telephone Encounter (Signed)
Received text from SW this am, pt trying to reach me reference PREP intake appt scheduled today at 11am.  Call to pt. Unable to attend today due to head cold and not feeling well. She plans on getting tested for Covid due to symptoms.  Explained we can reschedule and class will not start next week due to community Covid numbers.  Planning for 1/17 start instead.  Pt will call me after covid results and will reschedule then. She can always take a future call if need be.

## 2020-09-13 ENCOUNTER — Telehealth: Payer: Self-pay

## 2020-09-13 ENCOUNTER — Ambulatory Visit: Payer: Medicaid Other | Admitting: Advanced Practice Midwife

## 2020-09-13 DIAGNOSIS — O903 Peripartum cardiomyopathy: Secondary | ICD-10-CM | POA: Diagnosis not present

## 2020-09-13 DIAGNOSIS — Z6841 Body Mass Index (BMI) 40.0 and over, adult: Secondary | ICD-10-CM | POA: Diagnosis not present

## 2020-09-13 DIAGNOSIS — J45909 Unspecified asthma, uncomplicated: Secondary | ICD-10-CM | POA: Diagnosis not present

## 2020-09-13 DIAGNOSIS — F17210 Nicotine dependence, cigarettes, uncomplicated: Secondary | ICD-10-CM | POA: Diagnosis not present

## 2020-09-13 DIAGNOSIS — I5023 Acute on chronic systolic (congestive) heart failure: Secondary | ICD-10-CM | POA: Diagnosis not present

## 2020-09-13 DIAGNOSIS — Z452 Encounter for adjustment and management of vascular access device: Secondary | ICD-10-CM | POA: Diagnosis not present

## 2020-09-13 DIAGNOSIS — Z7984 Long term (current) use of oral hypoglycemic drugs: Secondary | ICD-10-CM | POA: Diagnosis not present

## 2020-09-13 DIAGNOSIS — I340 Nonrheumatic mitral (valve) insufficiency: Secondary | ICD-10-CM | POA: Diagnosis not present

## 2020-09-13 DIAGNOSIS — E119 Type 2 diabetes mellitus without complications: Secondary | ICD-10-CM | POA: Diagnosis not present

## 2020-09-13 NOTE — Telephone Encounter (Signed)
Pt contacted me regarding her not feeling well with head cold/hoarseness and not feeling too well. She has gyno appointment tomor-she will contact them to resch.  She was going to get someone to take her to pharmacy to get COVID tested.  Her ride then fell through and now she is just going to try to get to urgent care in the morning b/c she wants to be seen by doctor now.  So unsure if home visit will be made this week.  Her bubble packs are due next week. Spoke to pharmacist who will get them together for her and then also place the farxiga in bubble pack as it just got the PA for it several days ago.   Kerry Hough, EMT-Paramedic  09/13/20

## 2020-09-13 NOTE — Telephone Encounter (Signed)
Message received from Kerry Hough, EMT noting that the patient has a frayed cord on her CPAP machine and it is not safe to use.   Call placed to patient to inquire what company provided her CPAP machine  She was not sure... thought the company name began with an "A" and she was living in [place] at the time. She still has the machine and said her sleep study was about 2 years ago. She explained that her pet rabbit chewed on the cord. Informed her that this CM would try to find the company that provided her machine and inquire about next steps for obtaining a new machine.  Call placed to [employer] and they have no record of the patient.  Call placed to [facility] # [phone], spoke to [facility]  who stated that the patient had received a CPAP machine in 2018 and she still gets CPAP supplies from them.  She instructed this CM to call their CPAP processing department # [phone] to inquire about obtaining a new machine  Toniann Fail as not able to provide additional assistance.  Call then placed to CPAP processing, message left for call back to # [phone].

## 2020-09-19 ENCOUNTER — Telehealth (HOSPITAL_COMMUNITY): Payer: Self-pay | Admitting: Cardiology

## 2020-09-19 NOTE — Telephone Encounter (Signed)
Abnormal labs Labs drawn 09/07/20 K 3.5 Cr 0.68 BUN 15  Per Volney Presser, NP Confirm compliance with current potassium, should be taking 80/60/60. If patient is compliant with medication, increase to 80/80/60   Attempted to contact patient to review, no answer unable to leave message

## 2020-09-20 ENCOUNTER — Telehealth (HOSPITAL_COMMUNITY): Payer: Self-pay

## 2020-09-20 DIAGNOSIS — E119 Type 2 diabetes mellitus without complications: Secondary | ICD-10-CM | POA: Diagnosis not present

## 2020-09-20 DIAGNOSIS — Z6841 Body Mass Index (BMI) 40.0 and over, adult: Secondary | ICD-10-CM | POA: Diagnosis not present

## 2020-09-20 DIAGNOSIS — I5023 Acute on chronic systolic (congestive) heart failure: Secondary | ICD-10-CM | POA: Diagnosis not present

## 2020-09-20 DIAGNOSIS — Z452 Encounter for adjustment and management of vascular access device: Secondary | ICD-10-CM | POA: Diagnosis not present

## 2020-09-20 DIAGNOSIS — J45909 Unspecified asthma, uncomplicated: Secondary | ICD-10-CM | POA: Diagnosis not present

## 2020-09-20 DIAGNOSIS — O903 Peripartum cardiomyopathy: Secondary | ICD-10-CM | POA: Diagnosis not present

## 2020-09-20 DIAGNOSIS — F17210 Nicotine dependence, cigarettes, uncomplicated: Secondary | ICD-10-CM | POA: Diagnosis not present

## 2020-09-20 DIAGNOSIS — I340 Nonrheumatic mitral (valve) insufficiency: Secondary | ICD-10-CM | POA: Diagnosis not present

## 2020-09-20 DIAGNOSIS — Z7984 Long term (current) use of oral hypoglycemic drugs: Secondary | ICD-10-CM | POA: Diagnosis not present

## 2020-09-21 ENCOUNTER — Other Ambulatory Visit (HOSPITAL_COMMUNITY): Payer: Self-pay | Admitting: Cardiology

## 2020-09-21 ENCOUNTER — Other Ambulatory Visit (HOSPITAL_COMMUNITY): Payer: Self-pay

## 2020-09-21 NOTE — Progress Notes (Signed)
Paramedicine Encounter    [name], female    DOB: [birth year], 40 y.o.   MRN: [id number]   Patient Care Team: Marcine Matar, MD as PCP - General (Internal Medicine) Regan Lemming, MD as PCP - Electrophysiology (Cardiology) Laurey Morale, MD as PCP - Advanced Heart Failure (Cardiology)  Patient Active Problem List   Diagnosis Date Noted  . Influenza vaccine refused 08/24/2020  . Anxiety and depression 08/24/2020  . 23-polyvalent pneumococcal polysaccharide vaccine declined 08/24/2020  . Tobacco dependence 08/24/2020  . Acute on chronic systolic CHF (congestive heart failure) (HCC) 06/17/2019  . Obstructive sleep apnea 06/17/2019  . 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 Diabetes mellitus (HCC) 06/17/2019  . Amenorrhea 08/03/2018  . Congestive heart failure (HCC) 06/29/2018  . Hypotension 06/07/2018  . Rhinovirus infection 06/06/2018  . Asthma 06/05/2018  . Hypokalemia 06/05/2018  . Snoring 09/23/2016  . Cough 04/10/2016  . 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 Tobacco abuse 01/25/2016  . Acute on chronic combined systolic and diastolic CHF (congestive heart failure) (HCC) 01/25/2016  . Mitral regurgitation   . Abnormal EKG-inf TWI 01/15/2016  . Positive D dimer-CTA neg for PE 01/15/2016  . Morbid obesity- BMI 57 01/14/2016  . Abdominal pain 01/14/2016  . 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 At risk for sleep apnea 01/14/2016  . Cardiomyopathy (HCC) 11/01/2015  . Morbid obesity with BMI of 50.0-59.9, adult (HCC) 08/16/2015    Current Outpatient Medications:  .  atorvastatin (LIPITOR) 10 MG tablet, Take 1 tablet (10 mg total) by mouth daily. (Patient taking differently: Take 10 mg by mouth daily.), Disp: 90 tablet, Rfl: 3 .  carvedilol (COREG) 12.5 MG tablet, Take 1 tablet (12.5 mg total) by mouth 2 (two) times daily. Dose increase, Disp: 180 tablet, Rfl: 2 .  CORLANOR 7.5 MG TABS tablet, TAKE 1  TABLET (7.5 MG TOTAL) BY MOUTH 2 (TWO) TIMES DAILY WITH A MEAL (AM+BEDTIME), Disp: 60 tablet, Rfl: 3 .  dapagliflozin propanediol (FARXIGA) 5 MG TABS tablet, Take 1 tablet (5 mg total) by mouth daily before breakfast. (Patient not taking: Reported on 09/06/2020), Disp: 30 tablet, Rfl: 2 .  digoxin (LANOXIN) 0.125 MG tablet, Take 1 tablet (0.125 mg total) by mouth daily. (Patient taking differently: Take 0.125 mg by mouth daily.), Disp: 90 tablet, Rfl: 3 .  Dulaglutide (TRULICITY) 1.5 MG/0.5ML SOPN, Inject 1.5 mg into the skin once a week., Disp: , Rfl:  .  ipratropium-albuterol (DUONEB) 0.5-2.5 (3) MG/3ML SOLN, Take 3 mLs by nebulization every 4 (four) hours as needed., Disp: 360 mL, Rfl: 0 .  Magnesium Oxide 400 (240 Mg) MG TABS, Take 2 tablets by mouth every morning and 1 tablet by mouth every evening, Disp: 90 tablet, Rfl: 6 .  metolazone (ZAROXOLYN) 2.5 MG tablet, As directed by HF clinic, Disp: 10 tablet, Rfl: 3 .  metroNIDAZOLE (FLAGYL) 500 MG tablet, Take 1 tablet (500 mg total) by mouth 2 (two) times daily., Disp: 14 tablet, Rfl: 0 .  milrinone (PRIMACOR) 20 MG/100 ML SOLN infusion, Inject 0.0414 mg/min into the vein continuous., Disp:  , Rfl:  .  nicotine polacrilex (NICORETTE) 2 MG gum, Take 1 each (2 mg total) by mouth as needed for smoking cessation. (Patient not taking: Reported on 09/06/2020), Disp: 100 tablet, Rfl: 2 .  potassium chloride (KLOR-CON) 10 MEQ tablet, TAKE 8 TABS IN THE MORNING, 6 TABS IN THE AFTERNOON, AND 6 TABS IN THE EVENING, Disp: 600 tablet, Rfl: 3 .  PRESCRIPTION MEDICATION,  CPAP- AT BEDTIME (Patient not taking: Reported on 09/06/2020), Disp: , Rfl:  .  sacubitril-valsartan (ENTRESTO) 49-51 MG, Take 1 tablet by mouth 2 (two) times daily., Disp: 60 tablet, Rfl: 3 .  spironolactone (ALDACTONE) 25 MG tablet, Take 1 tablet (25 mg total) by mouth daily. (Patient taking differently: Take 25 mg by mouth daily.), Disp: 90 tablet, Rfl: 3 .  torsemide (DEMADEX) 100 MG tablet,  Take 1 tablet (100 mg total) by mouth 2 (two) times daily., Disp: 60 tablet, Rfl: 3 Allergies  Allergen Reactions  . Metformin Diarrhea    And caused severe "dry mouth," also  . Metformin And Related Diarrhea    And caused severe "dry mouth," also      Social History   Socioeconomic History  . Marital status: Single    Spouse name: Not on file  . Number of children: 2  . Years of education: Not on file  . Highest education level: Not on file  Occupational History  . Not on file  Tobacco Use  . Smoking status: Current Every Day Smoker    Types: Cigarettes    Last attempt to quit: 10/19/2015    Years since quitting: 4.9  . Smokeless tobacco: Never Used  . Tobacco comment: 3 cigarettes per day  Vaping Use  . Vaping Use: Never used  Substance and Sexual Activity  . Alcohol use: Yes    Alcohol/week: 0.0 standard drinks    Comment: occasionally  . Drug use: Yes    Types: Marijuana    Comment: only socially  . Sexual activity: Yes    Birth control/protection: None  Other Topics Concern  . Not on file  Social History Narrative  . Not on file   Social Determinants of Health   Financial Resource Strain: High Risk  . Difficulty of Paying Living Expenses: Hard  Food Insecurity: Food Insecurity Present  . Worried About Charity fundraiser in the Last Year: Sometimes true  . Ran Out of Food in the Last Year: Sometimes true  Transportation Needs: No Transportation Needs  . Lack of Transportation (Medical): No  . Lack of Transportation (Non-Medical): No  Physical Activity: Not on file  Stress: Not on file  Social Connections: Not on file  Intimate Partner Violence: Not on file    Physical Exam      Future Appointments  Date Time Provider [place]  10/04/2020 10:00 AM Deneise Lever, MD [facility]  10/23/2020  8:35 AM [facility] DEVICE REMOTES [facility]  11/02/2020 11:20 AM Larey Dresser, MD [facility] None  11/02/2020  1:40 PM Larey Dresser, MD [facility] None  11/24/2020  1:30 PM Ladell Pier, MD [facility] None  01/22/2021  8:35 AM [facility] DEVICE REMOTES [facility]  04/23/2021  8:35 AM [facility] DEVICE REMOTES [facility]  07/23/2021  8:35 AM [facility] DEVICE REMOTES [facility]  10/22/2021  8:35 AM [facility] DEVICE REMOTES [facility]  01/21/2022  8:35 AM [facility] DEVICE REMOTES [facility]    BP (!) 10/9 Comment: systolic  Pulse 86   Temp 99.2 F (37.3 C)   Resp 20   SpO2 98%   Weight today-356 Weight yesterday-356 Last visit weight-359  Pt reports feeling tired, she has been without her meds for past few days. She said she called pharmacy a few days ago and they havent sent her anything yet. I called victor today and he is waiting on an rx for corlanor and after he gets that he will fill  her bubble packs.  Pt reports she took another pregnancy test at home today and it showed positive-no period in December.  She does not want to keep the baby.    She said she got accepted to [facility] and it was going to be way cheaper to move there but she doesn't have the money to make the move.   -her potassium levels were low b/c she has not been taking her meds. She did have some potassium pills left here but she rationed them out. She did not tell me she was out of her meds. I have told her when she gets down to her last week then she needs to call them in.   She denies dizziness, no c/p. No increased sob.   Kerry Hough, EMT-Paramedic [phone] [facility] Paramedic  09/21/20

## 2020-09-21 NOTE — Telephone Encounter (Signed)
Pt aware of labs results during para medicine home visit Pt reports she has NOT been taking KCL and missed quite a few doses, pharmacy to deliver medications.  Advised to restart at current dose. HH to repeat labs this week

## 2020-09-21 NOTE — Telephone Encounter (Signed)
I had appointment with patient today but was running about behind and she said she had to leave to go to the school about one of her kids schoolwork.  Will resch for tomor.   Kerry Hough, EMT-Paramedic  09/20/2020

## 2020-09-21 NOTE — Telephone Encounter (Signed)
Call placed to [facility] # [phone], spoke to Nicholes Mango regarding patient's CPAP machine and the concern about  the frayed cord.  Marylene Land stated that the patient does not need a new sleep study, she just needs to bring the machine to the local [facility] office to be assessed.  Marylene Land said that she would call the patient and provide instructions regarding what to do. Provided Marylene Land with patient's current phone number. Marylene Land could not find any notes in the patient's record about problems with the CPAP machine.

## 2020-09-22 ENCOUNTER — Telehealth (HOSPITAL_COMMUNITY): Payer: Self-pay | Admitting: Licensed Clinical Social Worker

## 2020-09-22 NOTE — Telephone Encounter (Signed)
CSW called pt to check in following paramedic visit yesterday where pt had had two positive home pregnancy tests.  Pt is struggling with this news- feels guilty about getting pregnant even though she reports using protection.  Is unsure how she feels as about terminating the pregnancy but expresses understanding that continuing the pregnancy is very high risk for her health and that the medications she is on are not good for the baby.  Pt has OBGYN appt on Monday- CSW encouraged pt to talk with them in depth about her options moving forward.  CSW also offers to ask a provider to reach out and talk more about medication effects on the fetus and risks to her health for continuing with pregnancy so she can have all information to make an informed decision.  CSW offered support and encouraged pt to call to discuss further if needed.  CSW also spoke with pt about housing.  Has been accepted to [facility] but is not able to move forward with moving due to a supposed charge remaining from her time with [employer].  They allege that pt owes $900 and she can't move into new housing until its paid.  Pt is contesting and going to court and [facility] is giving her till March to get the fee covered.  Will continue to follow and assist as needed  Burna Sis, LCSW Clinical Social Worker [facility] Desk#: [phone] Cell#: [phone]

## 2020-09-26 ENCOUNTER — Telehealth: Payer: Self-pay

## 2020-09-26 NOTE — Telephone Encounter (Signed)
Attempt to reschedule PREP intake Unable to leave VM as box is full.  Will continue to attempt to reach for classes

## 2020-09-26 NOTE — Telephone Encounter (Signed)
Received call from pt Is still interested in PREP  Can do intake on 09/28/20 at 12N at [facility]  Can do am class beginning 10/02/20 M/W 10am to 1115am

## 2020-09-27 ENCOUNTER — Telehealth (HOSPITAL_COMMUNITY): Payer: Self-pay | Admitting: Licensed Clinical Social Worker

## 2020-09-27 DIAGNOSIS — F17210 Nicotine dependence, cigarettes, uncomplicated: Secondary | ICD-10-CM | POA: Diagnosis not present

## 2020-09-27 DIAGNOSIS — J45909 Unspecified asthma, uncomplicated: Secondary | ICD-10-CM | POA: Diagnosis not present

## 2020-09-27 DIAGNOSIS — I340 Nonrheumatic mitral (valve) insufficiency: Secondary | ICD-10-CM | POA: Diagnosis not present

## 2020-09-27 DIAGNOSIS — Z6841 Body Mass Index (BMI) 40.0 and over, adult: Secondary | ICD-10-CM | POA: Diagnosis not present

## 2020-09-27 DIAGNOSIS — E119 Type 2 diabetes mellitus without complications: Secondary | ICD-10-CM | POA: Diagnosis not present

## 2020-09-27 DIAGNOSIS — Z7984 Long term (current) use of oral hypoglycemic drugs: Secondary | ICD-10-CM | POA: Diagnosis not present

## 2020-09-27 DIAGNOSIS — I5023 Acute on chronic systolic (congestive) heart failure: Secondary | ICD-10-CM | POA: Diagnosis not present

## 2020-09-27 DIAGNOSIS — Z452 Encounter for adjustment and management of vascular access device: Secondary | ICD-10-CM | POA: Diagnosis not present

## 2020-09-27 DIAGNOSIS — O903 Peripartum cardiomyopathy: Secondary | ICD-10-CM | POA: Diagnosis not present

## 2020-09-27 NOTE — Telephone Encounter (Signed)
Pt [employer] energy assistance application rejected because only 55 and up was allowed to apply in December- CSW resubmitted paperwork to [facility] for review.  CSW also attempted to call pt to check in following positive pregnancy test last week- unable to reach- left message  Will continue to follow and assist as needed  Burna Sis, LCSW Clinical Social Worker [facility] Desk#: [phone] Cell#: [phone]

## 2020-09-28 ENCOUNTER — Other Ambulatory Visit (HOSPITAL_COMMUNITY): Payer: Self-pay

## 2020-09-28 ENCOUNTER — Telehealth (HOSPITAL_COMMUNITY): Payer: Self-pay | Admitting: Licensed Clinical Social Worker

## 2020-09-28 NOTE — Progress Notes (Signed)
[facility] PREP Progress Report   Patient Details  Name: Kirsten Wheeler MRN: [id number] Date of Birth: 1981-03-31 Age: 40 y.o. PCP: Marcine Matar, MD  Vitals:   09/28/20 1233  BP: (!) 110/0  Pulse: 88  SpO2: 97%  Weight: (!) 368 lb (166.9 kg)  Height: 5\' 4"  (1.626 m)      [facility] Eval - 09/28/20 1200      Referral    Referring Provider CHF clinic    Reason for referral Inactivity;Obesitity/Overweight   Needs an LVAD   Program Start Date --   TBD needs 10am M/W     Measurement   Waist Circumference --   60+   Hip Circumference --   >65   Body fat --   Too high to calculate     Information for Trainer   Goals wt loss of 60 lbs, Needs an LVAD    Current Exercise none    Orthopedic Concerns none    Pertinent Medical History CHF, IDDM (new diagnosis)? asthma?    Current Barriers endurance   Has previously exercised and enjoys it   Restrictions/Precautions Diabetic snack before exercise    Medications that affect exercise Beta blocker;Medication causing dizziness/drowsiness;Asthma inhaler      Timed Up and Go (TUGS)   Timed Up and Go Low risk <9 seconds      Mobility and Daily Activities   I find it easy to walk up or down two or more flights of stairs. 4    I have no trouble taking out the trash. 2    I do housework such as vacuuming and dusting on my own without difficulty. 4    I can easily lift a gallon of milk (8lbs). 4    I can easily walk a mile. 2    I have no trouble reaching into high cupboards or reaching down to pick up something from the floor. 2    I do not have trouble doing out-door work such as 09/30/20, raking leaves, or gardening. 1      Mobility and Daily Activities   I feel younger than my age. 2    I feel independent. 1    I feel energetic. 2    I live an active life.  4    I feel strong. 1    I feel healthy. 1    I feel active as other people my age. 4      How fit and strong are you.   Fit and Strong Total Score 34           Past Medical History:  Diagnosis Date  . Asthma   . Chronic systolic CHF (congestive heart failure) (HCC)    a. ECHO 01/15/2016 EF 10-15%. 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  . Diabetes mellitus (HCC) 06/17/2019  . Hearing loss    bilateral  . Mitral regurgitation    a. severe, felt to be functional 2/2 LV dilation   . Morbid obesity (HCC)   . Snoring    Past Surgical History:  Procedure Laterality Date  . CARDIAC CATHETERIZATION N/A 01/18/2016   Procedure: Right/Left Heart Cath and Coronary Angiography;  Surgeon: 03/19/2016, MD;  Location: [facility] INVASIVE CV LAB;  Service: Cardiovascular;  Laterality: N/A;  . CESAREAN SECTION    . ICD IMPLANT N/A 04/17/2017   Procedure: ICD Implant;  Surgeon: 06/17/2017, MD;  Location: [facility] INVASIVE CV LAB;  Service: Cardiovascular;  Laterality: N/A;  . IR FLUORO GUIDE CV LINE RIGHT  06/23/2019  . IR FLUORO GUIDE CV LINE RIGHT  10/15/2019  . IR REMOVAL TUN CV CATH W/O FL  10/15/2019  . IR US GUIDE VASC ACCESS RIGHT  06/23/2019  . IR US GUIDE VASC ACCESS RIGHT  10/15/2019  . LEAD REVISION/REPAIR N/A 04/21/2017   Procedure: Lead Revision/Repair;  Surgeon: Regan Lemming, MD;  Location: [facility] INVASIVE CV LAB;  Service: Cardiovascular;  Laterality: N/A;  . RIGHT HEART CATH N/A 06/21/2019   Procedure: RIGHT HEART CATH;  Surgeon: Laurey Morale, MD;  Location: [facility] INVASIVE CV LAB;  Service: Cardiovascular;  Laterality: N/A;  . TONSILLECTOMY    . UNILATERAL SALPINGECTOMY  11/15/2007   unsure of which tube, ectopic pregnancy    Social History   Tobacco Use  Smoking Status Current Every Day Smoker  . Types: Cigarettes  . Last attempt to quit: 10/19/2015  . Years since quitting: 4.9  Smokeless Tobacco Never Used  Tobacco Comment   3 cigarettes per day  Start date TBD. Will call pt tomorrow to discuss.  Given materials for sodium, sugar and how to read a label.  Early discussion about high sodium foods that will cause her to retain fluid  and make her heart work harder.  Feels as though her metabolism has shut down.We will begin slowly and add in exercise over time to her level of tolerance.  Does have a central line for Milrinone infusion to be aware of.   Bonnye Fava 09/28/2020, 12:39 PM

## 2020-09-28 NOTE — Telephone Encounter (Signed)
Pt reached out to confirm information for [facility] to get to PREP program initial meeting today at 12pm.  CSW provided pt with information and she was able to successfully schedule transportation to meeting today.  Pt reports she is doing ok at this time- has come to terms with the high risk nature of her pregnancy but still wishes she didn't have to make the decision.  CSW provided active listening and support.  Pt OBGYN appt now for Tuesday due to the weather- will plan to discuss options with her MD then regarding pregnancy.  Will continue to follow and assist as needed  Burna Sis, LCSW Clinical Social Worker [facility] Desk#: [phone] Cell#: [phone]

## 2020-09-28 NOTE — Progress Notes (Signed)
Paramedicine Encounter    [name], female    DOB: Apr 07, 1981, 40 y.o.   MRN: [id number]   Patient Care Team: Marcine Matar, MD as PCP - General (Internal Medicine) Regan Lemming, MD as PCP - Electrophysiology (Cardiology) Laurey Morale, MD as PCP - Advanced Heart Failure (Cardiology)  Patient Active Problem List   Diagnosis Date Noted  . Influenza vaccine refused 08/24/2020  . Anxiety and depression 08/24/2020  . 23-polyvalent pneumococcal polysaccharide vaccine declined 08/24/2020  . Tobacco dependence 08/24/2020  . Acute on chronic systolic CHF (congestive heart failure) (HCC) 06/17/2019  . Obstructive sleep apnea 06/17/2019  . 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 Diabetes mellitus (HCC) 06/17/2019  . Amenorrhea 08/03/2018  . Congestive heart failure (HCC) 06/29/2018  . Hypotension 06/07/2018  . Rhinovirus infection 06/06/2018  . Asthma 06/05/2018  . Hypokalemia 06/05/2018  . Snoring 09/23/2016  . Cough 04/10/2016  . 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 Tobacco abuse 01/25/2016  . Acute on chronic combined systolic and diastolic CHF (congestive heart failure) (HCC) 01/25/2016  . Mitral regurgitation   . Abnormal EKG-inf TWI 01/15/2016  . Positive D dimer-CTA neg for PE 01/15/2016  . Morbid obesity- BMI 57 01/14/2016  . Abdominal pain 01/14/2016  . 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 At risk for sleep apnea 01/14/2016  . Cardiomyopathy (HCC) 11/01/2015  . Morbid obesity with BMI of 50.0-59.9, adult (HCC) 08/16/2015    Current Outpatient Medications:  .  atorvastatin (LIPITOR) 10 MG tablet, Take 1 tablet (10 mg total) by mouth daily. (Patient taking differently: Take 10 mg by mouth daily.), Disp: 90 tablet, Rfl: 3 .  carvedilol (COREG) 12.5 MG tablet, Take 1 tablet (12.5 mg total) by mouth 2 (two) times daily. Dose increase, Disp: 180 tablet, Rfl: 2 .  CORLANOR 7.5 MG TABS tablet, TAKE 1  TABLET (7.5 MG TOTAL) BY MOUTH 2 (TWO) TIMES DAILY WITH A MEAL (AM+BEDTIME), Disp: 60 tablet, Rfl: 3 .  dapagliflozin propanediol (FARXIGA) 5 MG TABS tablet, Take 1 tablet (5 mg total) by mouth daily before breakfast., Disp: 30 tablet, Rfl: 2 .  digoxin (LANOXIN) 0.125 MG tablet, Take 1 tablet (0.125 mg total) by mouth daily. (Patient taking differently: Take 0.125 mg by mouth daily.), Disp: 90 tablet, Rfl: 3 .  Dulaglutide (TRULICITY) 1.5 MG/0.5ML SOPN, Inject 1.5 mg into the skin once a week., Disp: , Rfl:  .  ipratropium-albuterol (DUONEB) 0.5-2.5 (3) MG/3ML SOLN, Take 3 mLs by nebulization every 4 (four) hours as needed., Disp: 360 mL, Rfl: 0 .  Magnesium Oxide 400 (240 Mg) MG TABS, Take 2 tablets by mouth every morning and 1 tablet by mouth every evening, Disp: 90 tablet, Rfl: 6 .  metolazone (ZAROXOLYN) 2.5 MG tablet, As directed by HF clinic, Disp: 10 tablet, Rfl: 3 .  milrinone (PRIMACOR) 20 MG/100 ML SOLN infusion, Inject 0.0414 mg/min into the vein continuous., Disp:  , Rfl:  .  potassium chloride (KLOR-CON) 10 MEQ tablet, TAKE 8 TABS IN THE MORNING, 6 TABS IN THE AFTERNOON, AND 6 TABS IN THE EVENING, Disp: 600 tablet, Rfl: 3 .  sacubitril-valsartan (ENTRESTO) 49-51 MG, Take 1 tablet by mouth 2 (two) times daily., Disp: 60 tablet, Rfl: 3 .  spironolactone (ALDACTONE) 25 MG tablet, Take 1 tablet (25 mg total) by mouth daily. (Patient taking differently: Take 25 mg by mouth daily.), Disp: 90 tablet, Rfl: 3 .  torsemide (DEMADEX) 100 MG tablet, Take 1 tablet (100 mg total) by mouth 2 (  two) times daily., Disp: 60 tablet, Rfl: 3 .  metroNIDAZOLE (FLAGYL) 500 MG tablet, Take 1 tablet (500 mg total) by mouth 2 (two) times daily. (Patient not taking: Reported on 09/28/2020), Disp: 14 tablet, Rfl: 0 .  nicotine polacrilex (NICORETTE) 2 MG gum, Take 1 each (2 mg total) by mouth as needed for smoking cessation. (Patient not taking: No sig reported), Disp: 100 tablet, Rfl: 2 .  PRESCRIPTION MEDICATION,  CPAP- AT BEDTIME (Patient not taking: No sig reported), Disp: , Rfl:  Allergies  Allergen Reactions  . Metformin Diarrhea    And caused severe "dry mouth," also  . Metformin And Related Diarrhea    And caused severe "dry mouth," also      Social History   Socioeconomic History  . Marital status: Single    Spouse name: Not on file  . Number of children: 2  . Years of education: Not on file  . Highest education level: Not on file  Occupational History  . Not on file  Tobacco Use  . Smoking status: Current Every Day Smoker    Types: Cigarettes    Last attempt to quit: 10/19/2015    Years since quitting: 4.9  . Smokeless tobacco: Never Used  . Tobacco comment: 3 cigarettes per day  Vaping Use  . Vaping Use: Never used  Substance and Sexual Activity  . Alcohol use: Yes    Alcohol/week: 0.0 standard drinks    Comment: occasionally  . Drug use: Yes    Types: Marijuana    Comment: only socially  . Sexual activity: Yes    Birth control/protection: None  Other Topics Concern  . Not on file  Social History Narrative  . Not on file   Social Determinants of Health   Financial Resource Strain: High Risk  . Difficulty of Paying Living Expenses: Hard  Food Insecurity: Food Insecurity Present  . Worried About Programme researcher, broadcasting/film/video in the Last Year: Sometimes true  . Ran Out of Food in the Last Year: Sometimes true  Transportation Needs: No Transportation Needs  . Lack of Transportation (Medical): No  . Lack of Transportation (Non-Medical): No  Physical Activity: Not on file  Stress: Not on file  Social Connections: Not on file  Intimate Partner Violence: Not on file    Physical Exam      Future Appointments  Date Time Provider Department Center  10/03/2020 10:30 AM [facility] NURSE [facility] None  10/04/2020 10:00 AM Waymon Budge, MD [facility]  10/23/2020  8:35 AM [facility] DEVICE REMOTES [facility]  11/02/2020 11:20 AM Laurey Morale, MD  [facility] None  11/02/2020  1:40 PM Laurey Morale, MD [facility] None  11/24/2020  1:30 PM Marcine Matar, MD [facility] None  01/22/2021  8:35 AM [facility] DEVICE REMOTES [facility]  04/23/2021  8:35 AM [facility] DEVICE REMOTES [facility]  07/23/2021  8:35 AM [facility] DEVICE REMOTES [facility]  10/22/2021  8:35 AM [facility] DEVICE REMOTES [facility]  01/21/2022  8:35 AM [facility] DEVICE REMOTES [facility]    BP (!) 104/0   Pulse 89   Temp 97.9 F (36.6 C)   Resp (!) 22   SpO2 98%    Weight today-361 Weight yesterday-358 Last visit weight-356  Her OB appoint got changed from next Monday to next Tuesday.  She is not feeling well, she was still asleep when I arrived at 10am. Said she got up early around 6/7 to take meds and then went back to sleep.  She has all of her meds. She is now taking her farxiga. She has not weighed yet this morning. She just getting up.  She skipped her metolazone yesterday due to her not wanting to take it b/c she is scared to take it b/c she reports red spots on toilet paper when she wiped so she didn't want to take the metolazone.  She said that  happened on Tuesday and Wednesday during BM both days. But then she said when she wiped it wasn't when she wiped for BM but when she wiped the vaginal area it was lightly on the toilet paper. She goes to see OB on Tuesday  She always gets abd pain when she takes the water pills.  She will skip the torsemide and sometimes the metolazone b/c of the abd pain that comes with it so she "gives her body a break"  She states her breathing is good.  Her appetite coming and going, no nausea just feeling very sluggish.  Her weight is up from yesterday-she is going to prep class today and I told her to be sure to take that metolazone today when she gets back. Edema noted. Lung sounds she has a slight wheeze to the lower bases of lungs.  She states she will  call [facility] for the prep class after I leave.  No idea what she is doing with her potassium, I think she is being non-compliant with that just like she does with her torsemide and metolazone. Nurse will get labs this week.   Kerry Hough, EMT-Paramedic [phone] [facility] Paramedic  09/28/20

## 2020-09-29 ENCOUNTER — Telehealth: Payer: Self-pay

## 2020-09-29 NOTE — Telephone Encounter (Signed)
Call to pt reference change in start of PREP class to 10/23/20 M/W 10a-1115a.  Agreeable to starting then.

## 2020-10-03 ENCOUNTER — Telehealth: Payer: Self-pay | Admitting: Internal Medicine

## 2020-10-03 NOTE — Telephone Encounter (Signed)
   Trezure Cronk DOB: Feb 17, 1981 MRN: [id number]   RIDER WAIVER AND RELEASE OF LIABILITY  For purposes of improving physical access to our facilities, [facility] is pleased to partner with third parties to provide [facility] patients or other authorized individuals the option of convenient, on-demand ground transportation services (the [employer]") through use of the technology service that enables users to request on-demand ground transportation from independent third-party providers.  By opting to use and accept these [employer], I, the undersigned, hereby agree on behalf of myself, and on behalf of any minor child using the [employer] for whom I am the parent or legal guardian, as follows:  1. Science writer provided to me are provided by independent third-party transportation providers who are not [employer] or employees and who are unaffiliated with [employer]. 2. [facility] is neither a transportation carrier nor a common or public carrier. 3. [facility] has no control over the quality or safety of the transportation that occurs as a result of the [employer]. 4. [facility] cannot guarantee that any third-party transportation provider will complete any arranged transportation service. 5. [facility] makes no representation, warranty, or guarantee regarding the reliability, timeliness, quality, safety, suitability, or availability of any of the Transport Services or that they will be error free. 6. I fully understand that traveling by vehicle involves risks and dangers of serious bodily injury, including permanent disability, paralysis, and death. I agree, on behalf of myself and on behalf of any minor child using the Transport Services for whom I am the parent or legal guardian, that the entire risk arising out of my use of the [employer] remains solely with me, to the maximum extent permitted under applicable law. 7. The [employer]  are provided "as is" and "as available." [place] disclaims all representations and warranties, express, implied or statutory, not expressly set out in these terms, including the implied warranties of merchantability and fitness for a particular purpose. 8. I hereby waive and release [place], its agents, employees, officers, directors, representatives, insurers, attorneys, assigns, successors, subsidiaries, and affiliates from any and all past, present, or future claims, demands, liabilities, actions, causes of action, or suits of any kind directly or indirectly arising from acceptance and use of the [employer]. 9. I further waive and release [place] and its affiliates from all present and future liability and responsibility for any injury or death to persons or damages to property caused by or related to the use of the [employer]. 10. I have read this Waiver and Release of Liability, and I understand the terms used in it and their legal significance. This Waiver is freely and voluntarily given with the understanding that my right (as well as the right of any minor child for whom I am the parent or legal guardian using the [employer]) to legal recourse against  in connection with the [employer] is knowingly surrendered in return for use of these services.   I attest that I read the consent document to [name], gave Ms. Lober the opportunity to ask questions and answered the questions asked (if any). I affirm that [name] then provided consent for she's participation in this program.     Launa Grill

## 2020-10-04 ENCOUNTER — Other Ambulatory Visit: Payer: Self-pay

## 2020-10-04 ENCOUNTER — Ambulatory Visit (HOSPITAL_BASED_OUTPATIENT_CLINIC_OR_DEPARTMENT_OTHER): Payer: Medicaid Other | Admitting: Internal Medicine

## 2020-10-04 ENCOUNTER — Telehealth (HOSPITAL_COMMUNITY): Payer: Self-pay | Admitting: Licensed Clinical Social Worker

## 2020-10-04 ENCOUNTER — Encounter (HOSPITAL_BASED_OUTPATIENT_CLINIC_OR_DEPARTMENT_OTHER): Payer: Self-pay

## 2020-10-04 ENCOUNTER — Ambulatory Visit (INDEPENDENT_AMBULATORY_CARE_PROVIDER_SITE_OTHER): Payer: Medicaid Other | Admitting: *Deleted

## 2020-10-04 ENCOUNTER — Telehealth: Payer: Self-pay | Admitting: Family Medicine

## 2020-10-04 VITALS — BP 138/84 | HR 68 | Wt 367.2 lb

## 2020-10-04 DIAGNOSIS — F17210 Nicotine dependence, cigarettes, uncomplicated: Secondary | ICD-10-CM | POA: Diagnosis not present

## 2020-10-04 DIAGNOSIS — Z3201 Encounter for pregnancy test, result positive: Secondary | ICD-10-CM | POA: Diagnosis not present

## 2020-10-04 DIAGNOSIS — J45909 Unspecified asthma, uncomplicated: Secondary | ICD-10-CM | POA: Diagnosis not present

## 2020-10-04 DIAGNOSIS — Z6841 Body Mass Index (BMI) 40.0 and over, adult: Secondary | ICD-10-CM | POA: Diagnosis not present

## 2020-10-04 DIAGNOSIS — O99210 Obesity complicating pregnancy, unspecified trimester: Secondary | ICD-10-CM | POA: Insufficient documentation

## 2020-10-04 DIAGNOSIS — O903 Peripartum cardiomyopathy: Secondary | ICD-10-CM | POA: Diagnosis not present

## 2020-10-04 DIAGNOSIS — E119 Type 2 diabetes mellitus without complications: Secondary | ICD-10-CM | POA: Diagnosis not present

## 2020-10-04 DIAGNOSIS — I5023 Acute on chronic systolic (congestive) heart failure: Secondary | ICD-10-CM | POA: Diagnosis not present

## 2020-10-04 DIAGNOSIS — Z452 Encounter for adjustment and management of vascular access device: Secondary | ICD-10-CM | POA: Diagnosis not present

## 2020-10-04 DIAGNOSIS — I340 Nonrheumatic mitral (valve) insufficiency: Secondary | ICD-10-CM | POA: Diagnosis not present

## 2020-10-04 DIAGNOSIS — Z7984 Long term (current) use of oral hypoglycemic drugs: Secondary | ICD-10-CM | POA: Diagnosis not present

## 2020-10-04 DIAGNOSIS — O0990 Supervision of high risk pregnancy, unspecified, unspecified trimester: Secondary | ICD-10-CM | POA: Insufficient documentation

## 2020-10-04 LAB — POCT URINE PREGNANCY: Preg Test, Ur: POSITIVE — AB

## 2020-10-04 NOTE — Telephone Encounter (Signed)
Bethann Goo from [phone])  is calling to discuss medical issue about this pt. Pt has appt 10-24-20 for New OB high Risk (EDD 05-16-21) and Almira Coaster would like to talk about getting pt in earlier or options. Please call [facility] ASAP. thanks

## 2020-10-04 NOTE — Telephone Encounter (Signed)
Pt had OBGYN appt this morning which confirmed pregnancy then was referred to high risk OBGYN at [facility] but doesn't have appt till February 8th.  Pt distressed by late appt due to stress of pregnancy when child is non viable with medications that she has to take for heart failure and concerns for her health with continuing pregnancy.  CSW called [facility] who confirmed that there are no sooner appts available- CSW requested message be sent to provider regarding concerns with pt health with continued pregnancy- they will request a provider call me back to discuss and see if we can get sooner appt.  Pt had also let CSW know that she missed an appt this morning for a mask fitting for CPAP.  Pt states that it is not just her mask that is the problem but also her CPAP machine is broken (wire has been chewed through).  CSW called [facility] who reports if her machine is broken we would need to send in an order for another one but would need sleep study within a year- pt last sleep study was 3 years ago per patient.  CSW requested clinic send in sleep study order so we can initial process.  Will continue to follow and assist as needed  Burna Sis, LCSW Clinical Social Worker [facility] Desk#: [phone] Cell#: [phone]

## 2020-10-04 NOTE — Progress Notes (Signed)
PRENATAL INTAKE SUMMARY  Ms. Kirsten Wheeler presents today New OB Nurse Interview.  OB History    Gravida  7   Para  2   Term  2   Preterm      AB  4   Living  2     SAB  3   IAB      Ectopic  1   Multiple      Live Births  2          I have reviewed the patient's medical, obstetrical, social, and family histories, medications, and available lab results.  SUBJECTIVE She has no unusual complaints  OBJECTIVE Initial nurse interview for history (New OB)  EDD: 05/16/2021 by LMP GA: [redacted]w[redacted]d [id number]  GENERAL APPEARANCE: alert, well appearing, in no apparent distress, overweight   ASSESSMENT +UPT High Risk Pregnancy  PLAN Prenatal care [facility] Labs to be completed at next visit with MD only 10/24/2020  Clovis Pu, RN

## 2020-10-05 ENCOUNTER — Telehealth: Payer: Self-pay | Admitting: Family Medicine

## 2020-10-05 ENCOUNTER — Other Ambulatory Visit (HOSPITAL_COMMUNITY): Payer: Self-pay | Admitting: Cardiology

## 2020-10-05 NOTE — Telephone Encounter (Signed)
Notified by [facility] staff that patient has been scheduled with me for initial high risk OB visit.  Chart reviewed in detail, notable for severe HFrEF with last EF<20% secondary to peripartum cardiomyopathy, ICD in place, and currently on milrinone gtt as bridge to LVAD per most recent Cardiology note. In addition morbidly obese, OSA, and not a transplant candidate.  Called to discuss options with patient. Homero Fellers discussion had regarding the high likelihood of not only morbidity but possibly death given her end stage heart failure if she chooses to continue with this pregnancy. Emphasized that we will support her in whatever choice she makes. Patient has been agonizing over this but ultimately had come to the decision on her own before I called that she can not take the risk as she needs to be here for her two children, and she does not wish to continue with this pregnancy.   Given she is approximately 8 weeks she is borderline for a medical termination and requires evaluation ASAP. Discussed with her that this option will be much safer and easier as any medications/sedation she would need for a D&C would likely require transferring her care to a tertiary facility with a family planning center such as [facility] so that appropriate anesthesia consultation could be provided. I called [facility], they are not currently open but I left a detailed message without any patient identifiers but clearly laid out the clinical scenario and the need for a prompt evaluation, hopefully they will call me back in the morning. Pending that initial evaluation will attempt to further assist patient with her plan of care. If [facility] is able to see her tomorrow she does not have transportation, may attempt through [facility] but may need to explore other options if not.    Venora Maples, MD/MPH Attending Family Medicine Physician, [facility] for [facility], [facility]

## 2020-10-06 NOTE — Telephone Encounter (Signed)
Consulted with members of my group last night, given patient's high complexity even medical termination carries risks if she has excessive bleeding that could exacerbate her CHF. Contacted Dr. Otho Perl, MFM at [facility], he will evaluate the patient and offer appropriate treatment options.  Called patient back this morning and informed her of the change in plan and to expect call from [facility]. Reported she had forgotten to tell me that she had some mild abdominal cramping last night and two days prior had very mild spotting after going to the bathroom. Discussed these are normal in early pregnancy but should she have severe abdominal pain or heavy bleeding needs to present to hospital for ectopic evaluation.  She is in agreement with plan. Per message with SW they can arrange transportation to [facility] once she has an appointment.   Venora Maples, MD/MPH Attending Family Medicine Physician, [facility] for [facility], [facility]

## 2020-10-09 ENCOUNTER — Telehealth (HOSPITAL_COMMUNITY): Payer: Self-pay | Admitting: Licensed Clinical Social Worker

## 2020-10-09 NOTE — Telephone Encounter (Signed)
CSW informed by pt that an appt has been set up with [facility] for an ultrasound for tomorrow.  Pt sent CSW appt details:  Appt at 9am tomorrow at [address] [place], [place], [address]  CSW able to schedule transport for pt through [facility]- anticipated pick up at 7:53am- pt informed and has [facility] number if there are any problems in the morning  Will continue to follow and assist as needed  Burna Sis, LCSW Clinical Social Worker [facility] Desk#: [phone] Cell#: [phone]

## 2020-10-09 NOTE — Telephone Encounter (Signed)
Patient was contacted directly by Crissie Reese, MD and was given recommendations for follow up. Per chart review, [facility] is aware of plan of care.

## 2020-10-10 DIAGNOSIS — O10111 Pre-existing hypertensive heart disease complicating pregnancy, first trimester: Secondary | ICD-10-CM | POA: Diagnosis not present

## 2020-10-10 DIAGNOSIS — I429 Cardiomyopathy, unspecified: Secondary | ICD-10-CM | POA: Diagnosis not present

## 2020-10-10 DIAGNOSIS — I110 Hypertensive heart disease with heart failure: Secondary | ICD-10-CM | POA: Diagnosis not present

## 2020-10-10 DIAGNOSIS — Z3A08 8 weeks gestation of pregnancy: Secondary | ICD-10-CM | POA: Diagnosis not present

## 2020-10-10 DIAGNOSIS — O09521 Supervision of elderly multigravida, first trimester: Secondary | ICD-10-CM | POA: Diagnosis not present

## 2020-10-10 DIAGNOSIS — O1201 Gestational edema, first trimester: Secondary | ICD-10-CM | POA: Diagnosis not present

## 2020-10-10 DIAGNOSIS — I5022 Chronic systolic (congestive) heart failure: Secondary | ICD-10-CM | POA: Diagnosis not present

## 2020-10-10 DIAGNOSIS — O99412 Diseases of the circulatory system complicating pregnancy, second trimester: Secondary | ICD-10-CM | POA: Diagnosis not present

## 2020-10-10 DIAGNOSIS — Z6841 Body Mass Index (BMI) 40.0 and over, adult: Secondary | ICD-10-CM | POA: Diagnosis not present

## 2020-10-10 DIAGNOSIS — O24111 Pre-existing diabetes mellitus, type 2, in pregnancy, first trimester: Secondary | ICD-10-CM | POA: Diagnosis not present

## 2020-10-10 DIAGNOSIS — Z3689 Encounter for other specified antenatal screening: Secondary | ICD-10-CM | POA: Diagnosis not present

## 2020-10-10 DIAGNOSIS — O34211 Maternal care for low transverse scar from previous cesarean delivery: Secondary | ICD-10-CM | POA: Diagnosis not present

## 2020-10-10 DIAGNOSIS — O99211 Obesity complicating pregnancy, first trimester: Secondary | ICD-10-CM | POA: Diagnosis not present

## 2020-10-10 DIAGNOSIS — E119 Type 2 diabetes mellitus without complications: Secondary | ICD-10-CM | POA: Diagnosis not present

## 2020-10-11 DIAGNOSIS — E119 Type 2 diabetes mellitus without complications: Secondary | ICD-10-CM | POA: Diagnosis not present

## 2020-10-11 DIAGNOSIS — Z6841 Body Mass Index (BMI) 40.0 and over, adult: Secondary | ICD-10-CM | POA: Diagnosis not present

## 2020-10-11 DIAGNOSIS — F17210 Nicotine dependence, cigarettes, uncomplicated: Secondary | ICD-10-CM | POA: Diagnosis not present

## 2020-10-11 DIAGNOSIS — O903 Peripartum cardiomyopathy: Secondary | ICD-10-CM | POA: Diagnosis not present

## 2020-10-11 DIAGNOSIS — Z452 Encounter for adjustment and management of vascular access device: Secondary | ICD-10-CM | POA: Diagnosis not present

## 2020-10-11 DIAGNOSIS — I5023 Acute on chronic systolic (congestive) heart failure: Secondary | ICD-10-CM | POA: Diagnosis not present

## 2020-10-11 DIAGNOSIS — J45909 Unspecified asthma, uncomplicated: Secondary | ICD-10-CM | POA: Diagnosis not present

## 2020-10-11 DIAGNOSIS — Z7984 Long term (current) use of oral hypoglycemic drugs: Secondary | ICD-10-CM | POA: Diagnosis not present

## 2020-10-11 DIAGNOSIS — I340 Nonrheumatic mitral (valve) insufficiency: Secondary | ICD-10-CM | POA: Diagnosis not present

## 2020-10-12 ENCOUNTER — Telehealth (HOSPITAL_COMMUNITY): Payer: Self-pay | Admitting: Cardiology

## 2020-10-12 ENCOUNTER — Telehealth (HOSPITAL_COMMUNITY): Payer: Self-pay | Admitting: Licensed Clinical Social Worker

## 2020-10-12 ENCOUNTER — Other Ambulatory Visit (HOSPITAL_COMMUNITY): Payer: Self-pay | Admitting: Cardiology

## 2020-10-12 NOTE — Telephone Encounter (Signed)
Efraim Kaufmann, from [facility] is requesting most recent notes, last echo results, and labs faxed to [phone], her contact # [phone], pt has procedure scheduled in the morning, need records asap. Thanks

## 2020-10-12 NOTE — Telephone Encounter (Signed)
CSW received call from [facility] to inform that they were able to schedule pt for procedure tomorrow morning at 9:30am- she would need to arrive by 6:30am in order to be COVID tested prior to procedure.    CSW able to set up transportation through [facility] to get pt to appt tomorrow- updated pt on details.  [facility] states they have care coordinators who can assist with transport home over weekend as she will need to stay 1-2 nights depending on how she tolerates the procedure.  Will continue to follow and assist as needed  Burna Sis, LCSW Clinical Social Worker [facility] Desk#: [phone] Cell#: [phone]

## 2020-10-12 NOTE — Telephone Encounter (Signed)
Records faxed.

## 2020-10-13 DIAGNOSIS — O039 Complete or unspecified spontaneous abortion without complication: Secondary | ICD-10-CM | POA: Diagnosis not present

## 2020-10-13 DIAGNOSIS — O99321 Drug use complicating pregnancy, first trimester: Secondary | ICD-10-CM | POA: Diagnosis not present

## 2020-10-13 DIAGNOSIS — Z6841 Body Mass Index (BMI) 40.0 and over, adult: Secondary | ICD-10-CM | POA: Diagnosis not present

## 2020-10-13 DIAGNOSIS — Z833 Family history of diabetes mellitus: Secondary | ICD-10-CM | POA: Diagnosis not present

## 2020-10-13 DIAGNOSIS — O99411 Diseases of the circulatory system complicating pregnancy, first trimester: Secondary | ICD-10-CM | POA: Diagnosis not present

## 2020-10-13 DIAGNOSIS — Z20822 Contact with and (suspected) exposure to covid-19: Secondary | ICD-10-CM | POA: Diagnosis not present

## 2020-10-13 DIAGNOSIS — Z95810 Presence of automatic (implantable) cardiac defibrillator: Secondary | ICD-10-CM | POA: Diagnosis not present

## 2020-10-13 DIAGNOSIS — I517 Cardiomegaly: Secondary | ICD-10-CM | POA: Diagnosis not present

## 2020-10-13 DIAGNOSIS — E119 Type 2 diabetes mellitus without complications: Secondary | ICD-10-CM | POA: Diagnosis not present

## 2020-10-13 DIAGNOSIS — I5022 Chronic systolic (congestive) heart failure: Secondary | ICD-10-CM | POA: Diagnosis not present

## 2020-10-13 DIAGNOSIS — I493 Ventricular premature depolarization: Secondary | ICD-10-CM | POA: Diagnosis not present

## 2020-10-13 DIAGNOSIS — Z332 Encounter for elective termination of pregnancy: Secondary | ICD-10-CM | POA: Diagnosis not present

## 2020-10-13 DIAGNOSIS — Z8249 Family history of ischemic heart disease and other diseases of the circulatory system: Secondary | ICD-10-CM | POA: Diagnosis not present

## 2020-10-13 DIAGNOSIS — O0489 (Induced) termination of pregnancy with other complications: Secondary | ICD-10-CM | POA: Diagnosis not present

## 2020-10-13 DIAGNOSIS — O903 Peripartum cardiomyopathy: Secondary | ICD-10-CM | POA: Diagnosis not present

## 2020-10-13 DIAGNOSIS — G4733 Obstructive sleep apnea (adult) (pediatric): Secondary | ICD-10-CM | POA: Diagnosis not present

## 2020-10-13 DIAGNOSIS — Z3A09 9 weeks gestation of pregnancy: Secondary | ICD-10-CM | POA: Diagnosis not present

## 2020-10-14 DIAGNOSIS — I5020 Unspecified systolic (congestive) heart failure: Secondary | ICD-10-CM | POA: Diagnosis not present

## 2020-10-14 DIAGNOSIS — I428 Other cardiomyopathies: Secondary | ICD-10-CM | POA: Diagnosis not present

## 2020-10-15 DIAGNOSIS — Z3A00 Weeks of gestation of pregnancy not specified: Secondary | ICD-10-CM | POA: Diagnosis not present

## 2020-10-15 DIAGNOSIS — I5022 Chronic systolic (congestive) heart failure: Secondary | ICD-10-CM | POA: Diagnosis not present

## 2020-10-15 DIAGNOSIS — O0991 Supervision of high risk pregnancy, unspecified, first trimester: Secondary | ICD-10-CM | POA: Diagnosis not present

## 2020-10-15 DIAGNOSIS — O039 Complete or unspecified spontaneous abortion without complication: Secondary | ICD-10-CM | POA: Diagnosis not present

## 2020-10-15 DIAGNOSIS — I428 Other cardiomyopathies: Secondary | ICD-10-CM | POA: Diagnosis not present

## 2020-10-15 DIAGNOSIS — O0289 Other abnormal products of conception: Secondary | ICD-10-CM | POA: Diagnosis not present

## 2020-10-16 ENCOUNTER — Telehealth: Payer: Self-pay | Admitting: Family Medicine

## 2020-10-16 NOTE — Telephone Encounter (Signed)
Called patient to check on her s/p medical TAB at [facility]. Reports she is doing well, cramping is much improved. She plans to schedule IUD placement with [place] once she is fully recovered.

## 2020-10-17 ENCOUNTER — Telehealth (HOSPITAL_COMMUNITY): Payer: Self-pay | Admitting: Cardiology

## 2020-10-17 DIAGNOSIS — I340 Nonrheumatic mitral (valve) insufficiency: Secondary | ICD-10-CM | POA: Diagnosis not present

## 2020-10-17 DIAGNOSIS — Z6841 Body Mass Index (BMI) 40.0 and over, adult: Secondary | ICD-10-CM | POA: Diagnosis not present

## 2020-10-17 DIAGNOSIS — J45909 Unspecified asthma, uncomplicated: Secondary | ICD-10-CM | POA: Diagnosis not present

## 2020-10-17 DIAGNOSIS — F17210 Nicotine dependence, cigarettes, uncomplicated: Secondary | ICD-10-CM | POA: Diagnosis not present

## 2020-10-17 DIAGNOSIS — E119 Type 2 diabetes mellitus without complications: Secondary | ICD-10-CM | POA: Diagnosis not present

## 2020-10-17 DIAGNOSIS — Z452 Encounter for adjustment and management of vascular access device: Secondary | ICD-10-CM | POA: Diagnosis not present

## 2020-10-17 DIAGNOSIS — I5023 Acute on chronic systolic (congestive) heart failure: Secondary | ICD-10-CM | POA: Diagnosis not present

## 2020-10-17 DIAGNOSIS — Z7984 Long term (current) use of oral hypoglycemic drugs: Secondary | ICD-10-CM | POA: Diagnosis not present

## 2020-10-17 DIAGNOSIS — O903 Peripartum cardiomyopathy: Secondary | ICD-10-CM | POA: Diagnosis not present

## 2020-10-17 NOTE — Telephone Encounter (Signed)
Abnormal labs received  Labs drawn 10/11/20 K 3.4 BUN 13 Cr 0.84 Na 135  Per Prince Rome, NP Confirm if she is taking potassium, if so increase potassium to 8 tabs BID (80 meq BID) will recheck KCL with the next lab.  Spoke with patient, reports she had missed a few doses however with recent hospitalization labs were rechecked. Reports she will restart meds as ordered, nurse will be out to check labs

## 2020-10-18 ENCOUNTER — Telehealth: Payer: Self-pay

## 2020-10-18 DIAGNOSIS — Z7984 Long term (current) use of oral hypoglycemic drugs: Secondary | ICD-10-CM | POA: Diagnosis not present

## 2020-10-18 DIAGNOSIS — Z452 Encounter for adjustment and management of vascular access device: Secondary | ICD-10-CM | POA: Diagnosis not present

## 2020-10-18 DIAGNOSIS — I272 Pulmonary hypertension, unspecified: Secondary | ICD-10-CM | POA: Diagnosis not present

## 2020-10-18 DIAGNOSIS — J45909 Unspecified asthma, uncomplicated: Secondary | ICD-10-CM | POA: Diagnosis not present

## 2020-10-18 DIAGNOSIS — Z79899 Other long term (current) drug therapy: Secondary | ICD-10-CM | POA: Diagnosis not present

## 2020-10-18 DIAGNOSIS — F17210 Nicotine dependence, cigarettes, uncomplicated: Secondary | ICD-10-CM | POA: Diagnosis not present

## 2020-10-18 DIAGNOSIS — O903 Peripartum cardiomyopathy: Secondary | ICD-10-CM | POA: Diagnosis not present

## 2020-10-18 DIAGNOSIS — E119 Type 2 diabetes mellitus without complications: Secondary | ICD-10-CM | POA: Diagnosis not present

## 2020-10-18 DIAGNOSIS — Z6841 Body Mass Index (BMI) 40.0 and over, adult: Secondary | ICD-10-CM | POA: Diagnosis not present

## 2020-10-18 DIAGNOSIS — G4733 Obstructive sleep apnea (adult) (pediatric): Secondary | ICD-10-CM | POA: Diagnosis not present

## 2020-10-18 DIAGNOSIS — I340 Nonrheumatic mitral (valve) insufficiency: Secondary | ICD-10-CM | POA: Diagnosis not present

## 2020-10-18 DIAGNOSIS — I5022 Chronic systolic (congestive) heart failure: Secondary | ICD-10-CM | POA: Diagnosis not present

## 2020-10-18 DIAGNOSIS — I5023 Acute on chronic systolic (congestive) heart failure: Secondary | ICD-10-CM | POA: Diagnosis not present

## 2020-10-18 NOTE — Telephone Encounter (Signed)
Called to pt to confirm start of PREP 10/23/20 10am -1115am Agreeable to beginning then

## 2020-10-23 ENCOUNTER — Telehealth (HOSPITAL_COMMUNITY): Payer: Self-pay | Admitting: Cardiology

## 2020-10-23 ENCOUNTER — Ambulatory Visit (INDEPENDENT_AMBULATORY_CARE_PROVIDER_SITE_OTHER): Payer: Medicaid Other

## 2020-10-23 DIAGNOSIS — Z95810 Presence of automatic (implantable) cardiac defibrillator: Secondary | ICD-10-CM

## 2020-10-23 DIAGNOSIS — I5022 Chronic systolic (congestive) heart failure: Secondary | ICD-10-CM

## 2020-10-23 NOTE — Telephone Encounter (Signed)
Late entry Spoke with patient 10/20/20 regarding labs Abnormal labs received Labs drawn 10/18/20 K 31. Cr 0.73 BUN 14   Per Prince Rome, NP -is patient taking potassium? She missed some due to recent hospitalization will now need to increase to 100 meq BID  And will recheck at next lab draw if she is taking   Pt reports she has missed a lot of potassium, reports she will take Advised to be sure to take daily. Advised of risks

## 2020-10-24 ENCOUNTER — Other Ambulatory Visit: Payer: Self-pay

## 2020-10-24 ENCOUNTER — Ambulatory Visit (INDEPENDENT_AMBULATORY_CARE_PROVIDER_SITE_OTHER): Payer: Medicaid Other | Admitting: Family Medicine

## 2020-10-24 ENCOUNTER — Telehealth (HOSPITAL_COMMUNITY): Payer: Self-pay | Admitting: Licensed Clinical Social Worker

## 2020-10-24 ENCOUNTER — Other Ambulatory Visit (HOSPITAL_COMMUNITY): Payer: Self-pay

## 2020-10-24 ENCOUNTER — Encounter: Payer: Self-pay | Admitting: Family Medicine

## 2020-10-24 ENCOUNTER — Encounter (INDEPENDENT_AMBULATORY_CARE_PROVIDER_SITE_OTHER): Payer: Medicaid Other | Admitting: Family Medicine

## 2020-10-24 ENCOUNTER — Other Ambulatory Visit (HOSPITAL_COMMUNITY)
Admission: RE | Admit: 2020-10-24 | Discharge: 2020-10-24 | Disposition: A | Discharge status: Home / Self Care | Payer: Medicaid Other | Source: Ambulatory Visit | Attending: Family Medicine | Admitting: Family Medicine

## 2020-10-24 DIAGNOSIS — O0990 Supervision of high risk pregnancy, unspecified, unspecified trimester: Secondary | ICD-10-CM | POA: Insufficient documentation

## 2020-10-24 DIAGNOSIS — Z975 Presence of (intrauterine) contraceptive device: Secondary | ICD-10-CM | POA: Insufficient documentation

## 2020-10-24 DIAGNOSIS — Z30430 Encounter for insertion of intrauterine contraceptive device: Secondary | ICD-10-CM

## 2020-10-24 MED ORDER — LEVONORGESTREL 19.5 MCG/DAY IU IUD: INTRAUTERINE_SYSTEM | Freq: Once | INTRAUTERINE | Status: AC

## 2020-10-24 NOTE — BH Specialist Note (Signed)
Integrated Behavioral Health via Telemedicine Visit  10/24/2020 Kirsten Wheeler [id number]  Number of Integrated Behavioral Health visits: 1 Session Start time: 10:09  Session End time: 11:03 Total time: 58  Referring Provider: Merian Capron, MD Patient/Family location: [facility] Provider [facility]: [facility]  All persons participating in visit: Patient Kirsten Wheeler and Kirsten Wheeler   Types of Service: Telephone visit  I connected with Kirsten Wheeler and/or Kirsten Wheeler's n/a by Telephone  (Video is Caregility application) and verified that I am speaking with the correct person using two identifiers.Discussed confidentiality: Yes   I discussed the limitations of telemedicine and the availability of in person appointments.  Discussed there is a possibility of technology failure and discussed alternative modes of communication if that failure occurs.  I discussed that engaging in this telemedicine visit, they consent to the provision of behavioral healthcare and the services will be billed under their insurance.  Patient and/or legal guardian expressed understanding and consented to Telemedicine visit: Yes   Presenting Concerns: Patient and/or family reports the following symptoms/concerns: Pt states her primary concern today is grieving pregnancy loss; primary symptoms are fatigue, poor appetite and sleep disruption. Patient's goal is to lose 50-60 pounds to qualify for heart surgery; [facility] and has used sleep sounds at night in the past with success.  Duration of problem: Less than 2 weeks; Severity of problem: moderate  Patient and/or Family's Strengths/Protective Factors: Sense of purpose  Goals Addressed: Patient will: 1.  Reduce symptoms of: stress  2.  Increase knowledge and/or ability of: healthy habits  3.  Demonstrate ability to: Increase healthy adjustment to current life  circumstances and Continue healthy grieving over loss  Progress towards Goals: Ongoing  Interventions: Interventions utilized:  Supportive Counseling, Psychoeducation and/or Health Education and Link to [employer] Standardized Assessments completed: Not Needed  Patient and/or Family Response: Pt agrees with treatment plan  Assessment: Patient currently experiencing Grief.   Patient may benefit from psychoeducation and brief therapeutic interventions regarding coping grief and current life stress  Plan: 1. Follow up with behavioral health clinician on : Two weeks 2. Behavioral recommendations:  -Continue allowing time and space for feelings of grief -Consider additional grief support resources on After Visit Summary -Consider using thunder sounds at night again for the next two weeks for improved family sleep -Continue with plan to participate in [employer] Weight Loss Program, starting Monday, 10/30/20 -Discuss any diet changes with PCP on visit 11/13/20 3. Referral(s): Integrated Art gallery manager (In Clinic) and [employer]:  loss  I discussed the assessment and treatment plan with the patient and/or parent/guardian. They were provided an opportunity to ask questions and all were answered. They agreed with the plan and demonstrated an understanding of the instructions.   They were advised to call back or seek an in-person evaluation if the symptoms worsen or if the condition fails to improve as anticipated.  Kirsten Lips, LCSW   Depression screen [facility] 2/9 10/24/2020 08/24/2020  Decreased Interest 0 3  Down, Depressed, Hopeless 1 1  PHQ - 2 Score 1 4  Altered sleeping 1 3  Tired, decreased energy 2 3  Change in appetite 1 3  Feeling bad or failure about yourself  0 1  Trouble concentrating 0 0  Moving slowly or fidgety/restless 0 2  Suicidal thoughts 0 0  PHQ-9 Score 5 16  Some recent data might be hidden  GAD 7 : Generalized Anxiety Score 10/24/2020  08/24/2020  Nervous, Anxious, on Edge 0 2  Control/stop worrying 0 2  Worry too much - different things 0 3  Trouble relaxing 1 2  Restless 1 0  Easily annoyed or irritable 1 2  Afraid - awful might happen 1 2  Total GAD 7 Score 4 13

## 2020-10-24 NOTE — Telephone Encounter (Signed)
CSW informed by paramedic that pt reporting depressed mood, sleeping all day, not taking care of the apartment.  Pt apparently became tearful during meeting with paramedic today and admits to feeling down since her abortion last weekend.  Pt has reported symptoms of guilt regarding abortion to CSW but has been adamant about not re-engaging with mental health professional to address as she did not have positive experience with last therapist.  CSW called pt to discuss.  She admits to feeling low and is agreeable to CSW helping connect her with support.  States she became emotional during OBGYN appt today where she got IUD and they were actually able to set her up with one of their counselors for Thursday at 1:30pm.  CSW was also able to identify [facility] who offers post abortion specific support and counseling and can provide remote counseling to patient- pt is agreeable and CSW placed referral.  CSW attempted to discuss other behavioral modifications to help pt start to move forward and feel better but pt is feeling tired and uncomfortable following procedure today so is planning to go home and rest.  Encouraged pt to call if she wanted to come talk or needed any other support or resources.  Will plan to talk with pt Thursday to check in post appt.  Burna Sis, LCSW Clinical Social Worker [facility] Desk#: [phone] Cell#: [phone]

## 2020-10-24 NOTE — Progress Notes (Signed)
GYNECOLOGY OFFICE VISIT NOTE  History:   Kirsten Wheeler is a 40 y.o. [id number] here today for follow up of TAB.  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%, DM I was notified patient was pregnant last month and after discussion and counseling (see prior phone notes) she decided she would not continue the pregnancy I arranged for her to receive care at [facility] due to her significant medical comorbidities I reviewed the discharge in full, she was admitted to the cardiology service with initial plan to undergo D&C, however after concerns from anesthesia service she ultimately underwent a successful medical abortion  Today patient reports she is doing well physically Still having some bleeding but it is mild, occasional mild abdominal cramps She has not been sexually active since the procedure Emotionally she is feeling fragile but feels she has good support from her children and social worker She and her partner are no longer together, he has left her due to her decision to terminate the pregnancy She is interested in seeing behavioral health  She is also interested in a hormonal IUD She has had one previously and had a good experience  Past Medical History:  Diagnosis Date  . Asthma   . Chronic systolic CHF (congestive heart failure) (HCC)    a. ECHO 01/15/2016 EF 10-15%. 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  . Diabetes mellitus (HCC) 06/17/2019  . Hearing loss    bilateral  . Mitral regurgitation    a. severe, felt to be functional 2/2 LV dilation   . Morbid obesity (HCC)   . Snoring     Past Surgical History:  Procedure Laterality Date  . CARDIAC CATHETERIZATION N/A 01/18/2016   Procedure: Right/Left Heart Cath and Coronary Angiography;  Surgeon: Laurey Morale, MD;  Location: [facility] INVASIVE CV LAB;  Service: Cardiovascular;  Laterality: N/A;  . CESAREAN SECTION    . ICD IMPLANT N/A 04/17/2017    Procedure: ICD Implant;  Surgeon: Regan Lemming, MD;  Location: [facility] INVASIVE CV LAB;  Service: Cardiovascular;  Laterality: N/A;  . IR FLUORO GUIDE CV LINE RIGHT  06/23/2019  . IR FLUORO GUIDE CV LINE RIGHT  10/15/2019  . IR REMOVAL TUN CV CATH W/O FL  10/15/2019  . IR US GUIDE VASC ACCESS RIGHT  06/23/2019  . IR US GUIDE VASC ACCESS RIGHT  10/15/2019  . LEAD REVISION/REPAIR N/A 04/21/2017   Procedure: Lead Revision/Repair;  Surgeon: Regan Lemming, MD;  Location: [facility] INVASIVE CV LAB;  Service: Cardiovascular;  Laterality: N/A;  . RIGHT HEART CATH N/A 06/21/2019   Procedure: RIGHT HEART CATH;  Surgeon: Laurey Morale, MD;  Location: [facility] INVASIVE CV LAB;  Service: Cardiovascular;  Laterality: N/A;  . TONSILLECTOMY    . UNILATERAL SALPINGECTOMY  11/15/2007   unsure of which tube, ectopic pregnancy     The following portions of the patient's history were reviewed and updated as appropriate: allergies, current medications, past family history, past medical history, past social history, past surgical history and problem list.   Health Maintenance:  Normal pap and negative HRHPV: 08/16/2020.  Normal mammogram: n/a.   Review of Systems:  Pertinent items noted in HPI and remainder of comprehensive ROS otherwise negative.  Physical Exam:  BP 122/68   Pulse 90   LMP 08/09/2020 (Approximate)  CONSTITUTIONAL: Well-developed, well-nourished female in no acute distress.  HEENT:  Normocephalic, atraumatic. External right and left ear normal. No scleral icterus.  NECK: Normal range of motion, supple, no masses noted on observation SKIN: No rash noted. Not diaphoretic. No erythema. No pallor. MUSCULOSKELETAL: Normal range of motion. No edema noted. NEUROLOGIC: Alert and oriented to person, place, and time. Normal muscle tone coordination.  PSYCHIATRIC: Normal mood and affect. Normal behavior. Normal judgment and thought content. RESPIRATORY: Effort normal, no problems with respiration not PELVIC:  Normal appearing external genitalia; normal appearing vaginal mucosa and cervix with small amount of blood in the vault.  No abnormal discharge noted.  Labs and Imaging No results found for this or any previous visit (from the past 168 hour(s)). No results found.    Assessment and Plan:   Problem List Items Addressed This Visit      Other   IUD (intrauterine device) in place    Other Visit Diagnoses    Follow-up visit after therapeutic abortion    -  Primary   Relevant Orders   Cervicovaginal ancillary only( [place])   Encounter for insertion of progestin-releasing intrauterine contraceptive device (IUD)         #F/u TAB #IUD insertion Patient doing well physically, emotionally still coping with her decision and interested in seeing [facility], referral placed. Desires hormonal IUD for contraception, uncomplicated insertion, see separate note.   Return in about 4 weeks (around 11/21/2020) for IUD string check.    Total face-to-face time with patient: 25 minutes.  Over 50% of encounter was spent on counseling and coordination of care.   Venora Maples, MD/MPH Center for [employer], [facility]

## 2020-10-24 NOTE — Patient Instructions (Signed)

## 2020-10-24 NOTE — Progress Notes (Unsigned)
Pt states had termination of pregnancy on 10/14/20. Pt states would like to discuss birth control options as Liletta IUD.

## 2020-10-24 NOTE — Progress Notes (Signed)
Paramedicine Encounter    [name], female    DOB: 22-Mar-1981, 40 y.o.   MRN: [id number]   Patient Care Team: Marcine Matar, MD as PCP - General (Internal Medicine) Regan Lemming, MD as PCP - Electrophysiology (Cardiology) Laurey Morale, MD as PCP - Advanced Heart Failure (Cardiology)  Patient Active Problem List   Diagnosis Date Noted  . Supervision of high risk pregnancy, antepartum 10/04/2020  . Obesity affecting pregnancy, antepartum 10/04/2020  . Influenza vaccine refused 08/24/2020  . Anxiety and depression 08/24/2020  . 23-polyvalent pneumococcal polysaccharide vaccine declined 08/24/2020  . Tobacco dependence 08/24/2020  . Acute on chronic systolic CHF (congestive heart failure) (HCC) 06/17/2019  . Obstructive sleep apnea 06/17/2019  . 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 Diabetes mellitus (HCC) 06/17/2019  . Amenorrhea 08/03/2018  . Congestive heart failure (HCC) 06/29/2018  . Hypotension 06/07/2018  . Rhinovirus infection 06/06/2018  . Asthma 06/05/2018  . Hypokalemia 06/05/2018  . Snoring 09/23/2016  . Cough 04/10/2016  . 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 Tobacco abuse 01/25/2016  . Acute on chronic combined systolic and diastolic CHF (congestive heart failure) (HCC) 01/25/2016  . Mitral regurgitation   . Abnormal EKG-inf TWI 01/15/2016  . Positive D dimer-CTA neg for PE 01/15/2016  . Morbid obesity- BMI 57 01/14/2016  . Abdominal pain 01/14/2016  . 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 At risk for sleep apnea 01/14/2016  . Cardiomyopathy (HCC) 11/01/2015  . Morbid obesity with BMI of 50.0-59.9, adult (HCC) 08/16/2015    Current Outpatient Medications:  .  atorvastatin (LIPITOR) 10 MG tablet, Take 1 tablet (10 mg total) by mouth daily. (Patient taking differently: Take 10 mg by mouth daily.), Disp: 90 tablet, Rfl: 3 .  carvedilol (COREG) 12.5 MG tablet, Take 1 tablet (12.5 mg  total) by mouth 2 (two) times daily. Dose increase, Disp: 180 tablet, Rfl: 2 .  CORLANOR 7.5 MG TABS tablet, TAKE 1 TABLET (7.5 MG TOTAL) BY MOUTH 2 (TWO) TIMES DAILY WITH A MEAL (AM+BEDTIME), Disp: 60 tablet, Rfl: 3 .  dapagliflozin propanediol (FARXIGA) 5 MG TABS tablet, Take 1 tablet (5 mg total) by mouth daily before breakfast., Disp: 30 tablet, Rfl: 2 .  digoxin (LANOXIN) 0.125 MG tablet, Take 1 tablet (0.125 mg total) by mouth daily. (Patient taking differently: Take 0.125 mg by mouth daily.), Disp: 90 tablet, Rfl: 3 .  Dulaglutide (TRULICITY) 1.5 MG/0.5ML SOPN, Inject 1.5 mg into the skin once a week., Disp: , Rfl:  .  ipratropium-albuterol (DUONEB) 0.5-2.5 (3) MG/3ML SOLN, Take 3 mLs by nebulization every 4 (four) hours as needed., Disp: 360 mL, Rfl: 0 .  Magnesium Oxide 400 (240 Mg) MG TABS, Take 2 tablets by mouth every morning and 1 tablet by mouth every evening, Disp: 90 tablet, Rfl: 6 .  metolazone (ZAROXOLYN) 2.5 MG tablet, As directed by HF clinic, Disp: 10 tablet, Rfl: 3 .  metroNIDAZOLE (FLAGYL) 500 MG tablet, Take 1 tablet (500 mg total) by mouth 2 (two) times daily., Disp: 14 tablet, Rfl: 0 .  milrinone (PRIMACOR) 20 MG/100 ML SOLN infusion, Inject 0.0414 mg/min into the vein continuous., Disp:  , Rfl:  .  potassium chloride (KLOR-CON) 10 MEQ tablet, TAKE 8 TABS IN THE MORNING, 6 TABS IN THE AFTERNOON, AND 6 TABS IN THE EVENING, Disp: 600 tablet, Rfl: 3 .  PRESCRIPTION MEDICATION, CPAP- AT BEDTIME, Disp: , Rfl:  .  sacubitril-valsartan (ENTRESTO) 49-51 MG, Take 1 tablet by mouth 2 (two) times daily.,  Disp: 60 tablet, Rfl: 3 .  spironolactone (ALDACTONE) 25 MG tablet, Take 1 tablet (25 mg total) by mouth daily. (Patient taking differently: Take 25 mg by mouth daily.), Disp: 90 tablet, Rfl: 3 .  torsemide (DEMADEX) 100 MG tablet, Take 1 tablet (100 mg total) by mouth 2 (two) times daily., Disp: 60 tablet, Rfl: 3 .  nicotine polacrilex (NICORETTE) 2 MG gum, Take 1 each (2 mg total) by  mouth as needed for smoking cessation. (Patient not taking: Reported on 10/24/2020), Disp: 100 tablet, Rfl: 2 Allergies  Allergen Reactions  . Metformin Diarrhea    And caused severe "dry mouth," also  . Metformin And Related Diarrhea    And caused severe "dry mouth," also      Social History   Socioeconomic History  . Marital status: Single    Spouse name: Not on file  . Number of children: 2  . Years of education: Not on file  . Highest education level: Some college, no degree  Occupational History  . Occupation: Disability  Tobacco Use  . Smoking status: Former Smoker    Types: Cigarettes    Quit date: 07/18/2016    Years since quitting: 4.2  . Smokeless tobacco: Never Used  . Tobacco comment: 3 cigarettes per day  Vaping Use  . Vaping Use: Never used  Substance and Sexual Activity  . Alcohol use: Not Currently    Alcohol/week: 0.0 standard drinks    Comment: occasionally  . Drug use: Not Currently    Types: Marijuana    Comment: only socially  . Sexual activity: Yes    Birth control/protection: None  Other Topics Concern  . Not on file  Social History Narrative  . Not on file   Social Determinants of Health   Financial Resource Strain: High Risk  . Difficulty of Paying Living Expenses: Hard  Food Insecurity: Food Insecurity Present  . Worried About Programme researcher, broadcasting/film/video in the Last Year: Sometimes true  . Ran Out of Food in the Last Year: Sometimes true  Transportation Needs: No Transportation Needs  . Lack of Transportation (Medical): No  . Lack of Transportation (Non-Medical): No  Physical Activity: Not on file  Stress: Not on file  Social Connections: Not on file  Intimate Partner Violence: Not on file    Physical Exam      Future Appointments  Date Time Provider Department Center  10/24/2020  2:35 PM Venora Maples, MD [facility]  11/02/2020 11:20 AM Laurey Morale, MD [facility] None  11/02/2020  1:40 PM Laurey Morale, MD [facility] None   11/13/2020  2:10 PM Marcine Matar, MD [facility] None  01/22/2021  8:35 AM [facility] DEVICE REMOTES [facility]  04/23/2021  8:35 AM [facility] DEVICE REMOTES [facility]  07/23/2021  8:35 AM [facility] DEVICE REMOTES [facility]  10/22/2021  8:35 AM [facility] DEVICE REMOTES [facility]  01/21/2022  8:35 AM [facility] DEVICE REMOTES [facility]    BP (!) 98/0   Pulse 82   Temp 99.1 F (37.3 C)   Resp 18   Wt (!) 353 lb (160.1 kg)   LMP 08/09/2020 (Approximate)   SpO2 98%   BMI 60.59 kg/m   Weight yesterday-? Last visit weight-361  Pt seen today for f/u. She had her termination of pregnancy last week. She still having some mild bleeding, but they told her it was expected for up to 2 wks. She has been up and down emotionally. She was having a bad  day today.  She has f/u with OB doc today. She will call to get the transportation scheduled. They cut her back to 100mg  torsemide daily for a week, she is back on torsemide BID now.  She has not been taking her potassium as directed. A lot of days she is taking 80mg  daily and some she does the 8/6/6.  She states she does not want to talk to therapy anymore.  Right now she is self coping with sleeping all day which I told her was unhealthy as well.  I encouraged her to be open to more services that could be directly related to this particular situation.  She is going to call pharmacy to order her meds.   , EMT-Paramedic [phone] [facility] Paramedic  10/24/20

## 2020-10-25 ENCOUNTER — Telehealth: Payer: Self-pay | Admitting: Internal Medicine

## 2020-10-25 ENCOUNTER — Other Ambulatory Visit (HOSPITAL_COMMUNITY): Payer: Self-pay | Admitting: Cardiology

## 2020-10-25 DIAGNOSIS — Z452 Encounter for adjustment and management of vascular access device: Secondary | ICD-10-CM | POA: Diagnosis not present

## 2020-10-25 DIAGNOSIS — E119 Type 2 diabetes mellitus without complications: Secondary | ICD-10-CM | POA: Diagnosis not present

## 2020-10-25 DIAGNOSIS — O903 Peripartum cardiomyopathy: Secondary | ICD-10-CM | POA: Diagnosis not present

## 2020-10-25 DIAGNOSIS — G4733 Obstructive sleep apnea (adult) (pediatric): Secondary | ICD-10-CM | POA: Diagnosis not present

## 2020-10-25 DIAGNOSIS — Z79899 Other long term (current) drug therapy: Secondary | ICD-10-CM | POA: Diagnosis not present

## 2020-10-25 DIAGNOSIS — I340 Nonrheumatic mitral (valve) insufficiency: Secondary | ICD-10-CM | POA: Diagnosis not present

## 2020-10-25 DIAGNOSIS — I272 Pulmonary hypertension, unspecified: Secondary | ICD-10-CM | POA: Diagnosis not present

## 2020-10-25 DIAGNOSIS — I5022 Chronic systolic (congestive) heart failure: Secondary | ICD-10-CM | POA: Diagnosis not present

## 2020-10-25 DIAGNOSIS — J45909 Unspecified asthma, uncomplicated: Secondary | ICD-10-CM | POA: Diagnosis not present

## 2020-10-25 DIAGNOSIS — I5023 Acute on chronic systolic (congestive) heart failure: Secondary | ICD-10-CM | POA: Diagnosis not present

## 2020-10-25 DIAGNOSIS — F17210 Nicotine dependence, cigarettes, uncomplicated: Secondary | ICD-10-CM | POA: Diagnosis not present

## 2020-10-25 DIAGNOSIS — Z975 Presence of (intrauterine) contraceptive device: Secondary | ICD-10-CM | POA: Insufficient documentation

## 2020-10-25 DIAGNOSIS — Z7984 Long term (current) use of oral hypoglycemic drugs: Secondary | ICD-10-CM | POA: Diagnosis not present

## 2020-10-25 DIAGNOSIS — Z6841 Body Mass Index (BMI) 40.0 and over, adult: Secondary | ICD-10-CM | POA: Diagnosis not present

## 2020-10-25 LAB — CERVICOVAGINAL ANCILLARY ONLY
Bacterial Vaginitis (gardnerella): POSITIVE — AB
Candida Glabrata: NEGATIVE
Candida Vaginitis: NEGATIVE
Chlamydia: NEGATIVE
Comment: NEGATIVE
Comment: NEGATIVE
Comment: NEGATIVE
Comment: NEGATIVE
Comment: NEGATIVE
Comment: NORMAL
Neisseria Gonorrhea: NEGATIVE
Trichomonas: POSITIVE — AB

## 2020-10-25 LAB — CUP PACEART REMOTE DEVICE CHECK
Battery Remaining Longevity: 101 mo
Battery Voltage: 3 V
Brady Statistic RV Percent Paced: 0 %
Date Time Interrogation Session: 20220207093324
HighPow Impedance: 58 Ohm
Implantable Lead Implant Date: 20180802
Implantable Lead Location: 753860
Implantable Pulse Generator Implant Date: 20180802
Lead Channel Impedance Value: 304 Ohm
Lead Channel Impedance Value: 418 Ohm
Lead Channel Pacing Threshold Amplitude: 0.875 V
Lead Channel Pacing Threshold Pulse Width: 0.4 ms
Lead Channel Sensing Intrinsic Amplitude: 8.25 mV
Lead Channel Sensing Intrinsic Amplitude: 8.25 mV
Lead Channel Setting Pacing Amplitude: 2.5 V
Lead Channel Setting Pacing Pulse Width: 0.4 ms
Lead Channel Setting Sensing Sensitivity: 0.3 mV

## 2020-10-25 NOTE — Telephone Encounter (Signed)
Pt decline to get sch for covid vaccine at this time patient does have callback number [phone]

## 2020-10-25 NOTE — Progress Notes (Signed)
    GYNECOLOGY OFFICE PROCEDURE NOTE  Kirsten Wheeler is a 40 y.o. E3O1224 here for Liletta IUD insertion. No GYN concerns.  Last pap smear was on 08/16/2020 and was normal.  IUD Insertion Procedure Note Patient identified, informed consent performed, consent signed.   Discussed risks of irregular bleeding, infection, malpositioning or misplacement of the IUD outside the uterus which may require further procedure such as laparoscopy. Also discussed >99% contraception efficacy, increased risk of ectopic pregnancy with failure of method.  Time out was performed.   Speculum placed in the vagina.  Cervix visualized.  Cleaned with Betadine x 2.  Uterus sounded to 11 cm.  Liletta IUD placed per manufacturer's recommendations.  Strings trimmed to 3 cm. Tenaculum was removed, good hemostasis noted.  Patient tolerated procedure well.   Patient was given post-procedure instructions.  She was advised to have backup contraception for one week.  Patient was also asked to check IUD strings periodically and follow up in 4 weeks for IUD check.   Venora Maples, MD/MPH Attending Family Medicine Physician, [facility] for [facility], [facility]

## 2020-10-26 ENCOUNTER — Ambulatory Visit (INDEPENDENT_AMBULATORY_CARE_PROVIDER_SITE_OTHER): Payer: Medicaid Other | Admitting: Clinical

## 2020-10-26 ENCOUNTER — Telehealth: Payer: Self-pay | Admitting: Family Medicine

## 2020-10-26 DIAGNOSIS — F4321 Adjustment disorder with depressed mood: Secondary | ICD-10-CM

## 2020-10-26 MED ORDER — METRONIDAZOLE 500 MG PO TABS: 500.0000 mg | ORAL_TABLET | Freq: Two times a day (BID) | ORAL | 0 refills | Status: AC

## 2020-10-26 NOTE — Telephone Encounter (Signed)
Attempted to call pt with results per Dr Crissie Reese. Pt did not answer and VM box full. Unable to leave message. Will send mychart message for pt to return call to office.

## 2020-10-26 NOTE — Patient Instructions (Signed)
Center for [facility] at [facility] [address] [place], [place] [address] [phone] (main office) [phone] (Augustino Savastano's office)   Levi Aland,   Here are some resources you may find helpful at this time:   *aheartbreakingchoice.com   *endingawantedpregnancy.com   *postpartum.net ("termination for medical reasons" online support group)  /Emotional Wellbeing Apps and Websites Here are a few free apps meant to help you to help yourself.  To find, try searching on the internet to see if the app is offered on Apple/Android devices. If your first choice doesn't come up on your device, the good news is that there are many choices! Play around with different apps to see which ones are helpful to you.    Calm This is an app meant to help increase calm feelings. Includes info, strategies, and tools for tracking your feelings.      Calm Harm  This app is meant to help with self-harm. Provides many 5-minute or 15-min coping strategies for doing instead of hurting yourself.       Healthy Minds Health Minds is a problem-solving tool to help deal with emotions and cope with stress you encounter wherever you are.      MindShift This app can help people cope with anxiety. Rather than trying to avoid anxiety, you can make an important shift and face it.      MY3  MY3 features a support system, safety plan and resources with the goal of offering a tool to use in a time of need.       My Life My Voice  This mood journal offers a simple solution for tracking your thoughts, feelings and moods. Animated emoticons can help identify your mood.       Relax Melodies Designed to help with sleep, on this app you can mix sounds and meditations for relaxation.      Smiling Mind Smiling Mind is meditation made easy: it's a simple tool that helps put a smile on your mind.        Stop, Breathe & Think  A friendly, simple guide for people through  meditations for mindfulness and compassion.  Stop, Breathe and Think Kids Enter your current feelings and choose a "mission" to help you cope. Offers videos for certain moods instead of just sound recordings.       Team Orange The goal of this tool is to help teens change how they think, act, and react. This app helps you focus on your own good feelings and experiences.      The [employer] Box The [employer] Box (VHB) contains simple tools to help patients with coping, relaxation, distraction, and positive thinking.

## 2020-10-26 NOTE — Addendum Note (Signed)
Addended by: Merian Capron on: 10/26/2020 04:10 PM   Modules accepted: Orders

## 2020-10-26 NOTE — Telephone Encounter (Signed)
Attempted to call patient to notify her of her positive BV and trich result but no answer. Rx sent to her pharmacy. Please try to contact again and let her know partner will also need treatment which we can provide if needed.

## 2020-10-27 NOTE — Progress Notes (Signed)
Remote ICD transmission.   

## 2020-10-30 ENCOUNTER — Telehealth: Payer: Self-pay

## 2020-10-30 ENCOUNTER — Other Ambulatory Visit: Payer: Self-pay | Admitting: Cardiology

## 2020-10-30 ENCOUNTER — Other Ambulatory Visit (HOSPITAL_COMMUNITY): Payer: Self-pay | Admitting: Cardiology

## 2020-10-30 DIAGNOSIS — I5022 Chronic systolic (congestive) heart failure: Secondary | ICD-10-CM

## 2020-10-30 NOTE — BH Specialist Note (Deleted)
Integrated Behavioral Health via Telemedicine Visit  10/30/2020 Kirsten Wheeler [id number]  Number of Integrated Behavioral Health visits: *** Session Start time: 9:45***  Session End time: 10:15*** Total time: {IBH Total EVOJ:[id number]}  Referring Provider: Merian Capron, MD Patient/Family location: Home*** [facility] Provider location: Center for [facility] at [facility]  All persons participating in visit: Patient *** and Kirsten Wheeler ***  Types of Service: {CHL AMB TYPE OF SERVICE:[phone]}  I connected with Kirsten Wheeler and/or Kirsten Wheeler's {family members:20773} by Telephone  (Video is Caregility application) and verified that I am speaking with the correct person using two identifiers.Discussed confidentiality: {YES/NO:21197}  I discussed the limitations of telemedicine and the availability of in person appointments.  Discussed there is a possibility of technology failure and discussed alternative modes of communication if that failure occurs.  I discussed that engaging in this telemedicine visit, they consent to the provision of behavioral healthcare and the services will be billed under their insurance.  Patient and/or legal guardian expressed understanding and consented to Telemedicine visit: {YES/NO:21197}  Presenting Concerns: Patient and/or family reports the following symptoms/concerns: *** Duration of problem: ***; Severity of problem: {Mild/Moderate/Severe:20260}  Patient and/or Family's Strengths/Protective Factors: {CHL AMB BH PROTECTIVE [phone]}  Goals Addressed: Patient will: 1.  Reduce symptoms of: {IBH Symptoms:21014056}  2.  Increase knowledge and/or ability of: {IBH Patient Tools:21014057}  3.  Demonstrate ability to: {IBH Goals:21014053}  Progress towards Goals: {CHL AMB BH PROGRESS TOWARDS GOALS:[phone]}  Interventions: Interventions utilized:  {IBH Interventions:21014054} Standardized Assessments  completed: {IBH Screening Tools:21014051}  Patient and/or Family Response: ***  Assessment: Patient currently experiencing ***.   Patient may benefit from ***.  Plan: 1. Follow up with behavioral health clinician on : *** 2. Behavioral recommendations: *** 3. Referral(s): {IBH Referrals:21014055}  I discussed the assessment and treatment plan with the patient and/or parent/guardian. They were provided an opportunity to ask questions and all were answered. They agreed with the plan and demonstrated an understanding of the instructions.   They were advised to call back or seek an in-person evaluation if the symptoms worsen or if the condition fails to improve as anticipated.  [name], LCSW

## 2020-10-30 NOTE — Telephone Encounter (Signed)
Message from pt today. Forgot class was today. Will be out of town on Wednesday but back on Monday.  Will need to make up classes and fit test.

## 2020-10-31 ENCOUNTER — Other Ambulatory Visit (HOSPITAL_COMMUNITY): Payer: Self-pay

## 2020-10-31 DIAGNOSIS — E119 Type 2 diabetes mellitus without complications: Secondary | ICD-10-CM | POA: Diagnosis not present

## 2020-10-31 DIAGNOSIS — Z6841 Body Mass Index (BMI) 40.0 and over, adult: Secondary | ICD-10-CM | POA: Diagnosis not present

## 2020-10-31 DIAGNOSIS — Z452 Encounter for adjustment and management of vascular access device: Secondary | ICD-10-CM | POA: Diagnosis not present

## 2020-10-31 DIAGNOSIS — J45909 Unspecified asthma, uncomplicated: Secondary | ICD-10-CM | POA: Diagnosis not present

## 2020-10-31 DIAGNOSIS — G4733 Obstructive sleep apnea (adult) (pediatric): Secondary | ICD-10-CM | POA: Diagnosis not present

## 2020-10-31 DIAGNOSIS — I340 Nonrheumatic mitral (valve) insufficiency: Secondary | ICD-10-CM | POA: Diagnosis not present

## 2020-10-31 DIAGNOSIS — Z7984 Long term (current) use of oral hypoglycemic drugs: Secondary | ICD-10-CM | POA: Diagnosis not present

## 2020-10-31 DIAGNOSIS — I5022 Chronic systolic (congestive) heart failure: Secondary | ICD-10-CM | POA: Diagnosis not present

## 2020-10-31 DIAGNOSIS — Z79899 Other long term (current) drug therapy: Secondary | ICD-10-CM | POA: Diagnosis not present

## 2020-10-31 DIAGNOSIS — O903 Peripartum cardiomyopathy: Secondary | ICD-10-CM | POA: Diagnosis not present

## 2020-10-31 DIAGNOSIS — I272 Pulmonary hypertension, unspecified: Secondary | ICD-10-CM | POA: Diagnosis not present

## 2020-10-31 DIAGNOSIS — F17210 Nicotine dependence, cigarettes, uncomplicated: Secondary | ICD-10-CM | POA: Diagnosis not present

## 2020-10-31 NOTE — Progress Notes (Signed)
Paramedicine Encounter    [name], female    DOB: 1981/01/17, 40 y.o.   MRN: [id number]   Patient Care Team: Marcine Matar, MD as PCP - General (Internal Medicine) Regan Lemming, MD as PCP - Electrophysiology (Cardiology) Laurey Morale, MD as PCP - Advanced Heart Failure (Cardiology)  Patient Active Problem List   Diagnosis Date Noted  . IUD (intrauterine device) in place 10/25/2020  . Influenza vaccine refused 08/24/2020  . Anxiety and depression 08/24/2020  . 23-polyvalent pneumococcal polysaccharide vaccine declined 08/24/2020  . Tobacco dependence 08/24/2020  . Acute on chronic systolic CHF (congestive heart failure) (HCC) 06/17/2019  . Obstructive sleep apnea 06/17/2019  . 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 Diabetes mellitus (HCC) 06/17/2019  . Amenorrhea 08/03/2018  . Congestive heart failure (HCC) 06/29/2018  . Hypotension 06/07/2018  . Rhinovirus infection 06/06/2018  . Asthma 06/05/2018  . Hypokalemia 06/05/2018  . Snoring 09/23/2016  . Cough 04/10/2016  . 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 Tobacco abuse 01/25/2016  . Acute on chronic combined systolic and diastolic CHF (congestive heart failure) (HCC) 01/25/2016  . Mitral regurgitation   . Abnormal EKG-inf TWI 01/15/2016  . Positive D dimer-CTA neg for PE 01/15/2016  . Morbid obesity- BMI 57 01/14/2016  . Abdominal pain 01/14/2016  . 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 At risk for sleep apnea 01/14/2016  . Cardiomyopathy (HCC) 11/01/2015  . Morbid obesity with BMI of 50.0-59.9, adult (HCC) 08/16/2015    Current Outpatient Medications:  .  atorvastatin (LIPITOR) 10 MG tablet, Take 1 tablet (10 mg total) by mouth daily. (Patient taking differently: Take 10 mg by mouth daily.), Disp: 90 tablet, Rfl: 3 .  carvedilol (COREG) 12.5 MG tablet, Take 1 tablet (12.5 mg total) by mouth 2 (two) times daily. Dose increase, Disp: 180 tablet,  Rfl: 2 .  CORLANOR 7.5 MG TABS tablet, TAKE 1 TABLET (7.5 MG TOTAL) BY MOUTH 2 (TWO) TIMES DAILY WITH A MEAL (AM+BEDTIME), Disp: 60 tablet, Rfl: 3 .  dapagliflozin propanediol (FARXIGA) 5 MG TABS tablet, Take 1 tablet (5 mg total) by mouth daily before breakfast., Disp: 30 tablet, Rfl: 2 .  digoxin (LANOXIN) 0.125 MG tablet, Take 1 tablet (0.125 mg total) by mouth daily. (Patient taking differently: Take 0.125 mg by mouth daily.), Disp: 90 tablet, Rfl: 3 .  Dulaglutide (TRULICITY) 1.5 MG/0.5ML SOPN, Inject 1.5 mg into the skin once a week., Disp: , Rfl:  .  ipratropium-albuterol (DUONEB) 0.5-2.5 (3) MG/3ML SOLN, Take 3 mLs by nebulization every 4 (four) hours as needed., Disp: 360 mL, Rfl: 0 .  levonorgestrel (LILETTA, 52 MG,) 19.5 MCG/DAY IUD IUD, 1 each by Intrauterine route once., Disp: , Rfl:  .  Magnesium Oxide 400 (240 Mg) MG TABS, Take 2 tablets by mouth every morning and 1 tablet by mouth every evening, Disp: 90 tablet, Rfl: 6 .  metolazone (ZAROXOLYN) 2.5 MG tablet, As directed by HF clinic, Disp: 10 tablet, Rfl: 3 .  metroNIDAZOLE (FLAGYL) 500 MG tablet, Take 1 tablet (500 mg total) by mouth 2 (two) times daily for 7 days., Disp: 14 tablet, Rfl: 0 .  milrinone (PRIMACOR) 20 MG/100 ML SOLN infusion, Inject 0.0414 mg/min into the vein continuous., Disp:  , Rfl:  .  potassium chloride (KLOR-CON) 10 MEQ tablet, TAKE 8 TABS IN THE MORNING, 6 TABS IN THE AFTERNOON, AND 6 TABS IN THE EVENING, Disp: 600 tablet, Rfl: 3 .  PRESCRIPTION MEDICATION, CPAP- AT BEDTIME, Disp: , Rfl:  .  sacubitril-valsartan (ENTRESTO) 49-51 MG, Take 1 tablet by mouth 2 (two) times daily. Needs appointment for further refill., Disp: 60 tablet, Rfl: 0 .  spironolactone (ALDACTONE) 25 MG tablet, Take 1 tablet (25 mg total) by mouth daily. (Patient taking differently: Take 25 mg by mouth daily.), Disp: 90 tablet, Rfl: 3 .  torsemide (DEMADEX) 100 MG tablet, Take 1 tablet (100 mg total) by mouth 2 (two) times daily., Disp: 60  tablet, Rfl: 3 Allergies  Allergen Reactions  . Metformin Diarrhea    And caused severe "dry mouth," also  . Metformin And Related Diarrhea    And caused severe "dry mouth," also      Social History   Socioeconomic History  . Marital status: Single    Spouse name: Not on file  . Number of children: 2  . Years of education: Not on file  . Highest education level: Some college, no degree  Occupational History  . Occupation: Disability  Tobacco Use  . Smoking status: Former Smoker    Types: Cigarettes    Quit date: 07/18/2016    Years since quitting: 4.2  . Smokeless tobacco: Never Used  . Tobacco comment: 3 cigarettes per day  Vaping Use  . Vaping Use: Never used  Substance and Sexual Activity  . Alcohol use: Not Currently    Alcohol/week: 0.0 standard drinks    Comment: occasionally  . Drug use: Not Currently    Types: Marijuana    Comment: only socially  . Sexual activity: Yes    Birth control/protection: None  Other Topics Concern  . Not on file  Social History Narrative  . Not on file   Social Determinants of Health   Financial Resource Strain: High Risk  . Difficulty of Paying Living Expenses: Hard  Food Insecurity: Food Insecurity Present  . Worried About Programme researcher, broadcasting/film/video in the Last Year: Sometimes true  . Ran Out of Food in the Last Year: Sometimes true  Transportation Needs: No Transportation Needs  . Lack of Transportation (Medical): No  . Lack of Transportation (Non-Medical): No  Physical Activity: Not on file  Stress: Not on file  Social Connections: Not on file  Intimate Partner Violence: Not on file    Physical Exam      Future Appointments  Date Time Provider Department Center  11/09/2020  8:30 AM [facility] PA/NP [facility] None  11/13/2020  9:45 AM [facility]  11/13/2020  2:10 PM Marcine Matar, MD [facility] None  11/21/2020  1:35 PM Venora Maples, MD [facility]  12/29/2020  8:00 AM Laurey Morale, MD [facility] None  01/22/2021  8:35 AM [facility] DEVICE REMOTES [facility]  04/23/2021  8:35 AM [facility] DEVICE REMOTES [facility]  07/23/2021  8:35 AM [facility] DEVICE REMOTES [facility]  10/22/2021  8:35 AM [facility] DEVICE REMOTES [facility]  01/21/2022  8:35 AM [facility] DEVICE REMOTES [facility]    BP (!) 90/0   Pulse 92   Resp 20   SpO2 98%  CBG EMS-222 Weight yesterday-357 Last visit weight-353  Pt reports she is doing ok, she is leaving out of town tomor to see her sister in [place] until Sunday.  She has her bubble pack of meds. Taking potassium.  Her bubble packs were checked and correct. So she is good for that. She is calling pharmacy to get the trulicity ordered-she has not that in 2 wks. She called last week and it needed to be approved by her  PCP. So she may be going out of that for another week as well if the PCP doesn't send over the refill in time.  Her home nurse will come out tomor but she will be gone so she will try to get her to come out today.  She is gong to take her metolazone today since she is traveling tomor.   Kerry Hough, EMT-Paramedic [phone] [facility] Paramedic  10/31/20

## 2020-10-31 NOTE — Telephone Encounter (Signed)
Called pt to follow up. Pt reports she has picked up medication and is taking. Explained pt should refrain from intercourse for 2 weeks following completion of antibiotic. States she informed her partner of need for treatment, but he refused. Pt states she is no longer with this partner. Pt will return for string check on 11/21/20 and would like to have a test of cure performed that day. Discussed Trich infection and how this is transmitted; all of patients questions answered at this time.

## 2020-10-31 NOTE — Telephone Encounter (Signed)
This is a CHF pt 

## 2020-11-01 ENCOUNTER — Other Ambulatory Visit (HOSPITAL_COMMUNITY): Payer: Self-pay | Admitting: Cardiology

## 2020-11-02 ENCOUNTER — Encounter (HOSPITAL_COMMUNITY): Payer: Medicaid Other | Admitting: Cardiology

## 2020-11-08 DIAGNOSIS — I272 Pulmonary hypertension, unspecified: Secondary | ICD-10-CM | POA: Diagnosis not present

## 2020-11-08 DIAGNOSIS — O903 Peripartum cardiomyopathy: Secondary | ICD-10-CM | POA: Diagnosis not present

## 2020-11-08 DIAGNOSIS — G4733 Obstructive sleep apnea (adult) (pediatric): Secondary | ICD-10-CM | POA: Diagnosis not present

## 2020-11-08 DIAGNOSIS — I340 Nonrheumatic mitral (valve) insufficiency: Secondary | ICD-10-CM | POA: Diagnosis not present

## 2020-11-08 DIAGNOSIS — Z7984 Long term (current) use of oral hypoglycemic drugs: Secondary | ICD-10-CM | POA: Diagnosis not present

## 2020-11-08 DIAGNOSIS — I5022 Chronic systolic (congestive) heart failure: Secondary | ICD-10-CM | POA: Diagnosis not present

## 2020-11-08 DIAGNOSIS — Z452 Encounter for adjustment and management of vascular access device: Secondary | ICD-10-CM | POA: Diagnosis not present

## 2020-11-08 DIAGNOSIS — Z79899 Other long term (current) drug therapy: Secondary | ICD-10-CM | POA: Diagnosis not present

## 2020-11-08 DIAGNOSIS — J45909 Unspecified asthma, uncomplicated: Secondary | ICD-10-CM | POA: Diagnosis not present

## 2020-11-08 DIAGNOSIS — Z6841 Body Mass Index (BMI) 40.0 and over, adult: Secondary | ICD-10-CM | POA: Diagnosis not present

## 2020-11-08 DIAGNOSIS — E119 Type 2 diabetes mellitus without complications: Secondary | ICD-10-CM | POA: Diagnosis not present

## 2020-11-08 DIAGNOSIS — F17210 Nicotine dependence, cigarettes, uncomplicated: Secondary | ICD-10-CM | POA: Diagnosis not present

## 2020-11-08 DIAGNOSIS — I5023 Acute on chronic systolic (congestive) heart failure: Secondary | ICD-10-CM | POA: Diagnosis not present

## 2020-11-08 NOTE — Progress Notes (Signed)
Advanced [facility] Failure Clinic Note   Referring Physician:PCP: Marcine Matar, MD PCP-Cardiologist: Dr. Megan Mans GYN : Dr Mathews Robinsons.   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   She presented to the ER on 06/17/19 with several days of worsening HF with SOB at rest, marked edema and orthopnea/PND, not responding to increased doses of oral diuretics. She admitted to high levels of water and Gatorade intake. Her admit wt was 380 lb (Dry wt ~360 lb). She was started on IV Lasix but had initial poor urinary response. PO metolazone added as adjunct. Echo showed EF <20%, relatively normal RV, moderate MR. RHC 06/21/19 with low output (CI 1.8) and elevated PCWP but near normal RA pressure. She was started on inotropic therapy w/ milrinone for low output, 0.25 mcg/kg/min. Also treated w/ Entresto, Coreg, spironolactone, digoxin and ivabradine. She was later transitioned off of IV Lasix to po torsemide 80 mg qam/60 mg qpm.  Unfortunately, she was not a transplant candidate due to her morbid obesity. We discussed her at [facility] with Dr. Donata Clay. She will need significant weight loss to get to LVAD. Will use home milrinone to try to facilitate increased activity to work towards weight loss. She understands that milrinone is not a good end-point and that we would like to use it as a bridge to eventual LVAD. RV function seems adequate for LVAD. Tunneled catheter was placed for home milrinone and home health was arranged. She was discharged home on 06/24/20 on milrinone 0.25 mcg/kg/min.   06/27/19 she presented to the [facility] w/ CC of palpitations. Was brought in by EMS and was reportedly in SVT with HR in the 180s. She was given adenosine and converted to NSR prior to arrival to the ED.  PICC exchanged 10/15/19  Today she returns for HF follow up.Overall feeling fair. Complaining of nausea today. She is asking for a pregnancy test.  She is sexually active. Not using CPAP because its broken.  Denies SOB/PND/Orthopnea. Appetite ok. No fever or chills. She has not been weighing at home. Says she has been taking her medications with the bubble packs. Smoking 1-3 cigarettes per day.  Followed by HF Paramedicine.   Today she returns for HF follow up. Last seen 12/21. Since then she has had a positive pregnancy with subsequent termination at [place]. Overall feeling fine today. Denies increasing SOB, CP, dizziness, edema, or PND/Orthopnea. Appetite ok. No fever or chills. Weight at home ~350-360 pounds. Taking all medications. Takes metolazone every Wednesday.  ICD interrogation (personally reviewed): OptiVol index down but trending up towards threshold, thoracic impedence near baseline, activity ~1-2 hrs/day, no VT/VF.  Labs (8/21): K 4.4, creatinine 0.9, hgb 14.4. Labs (06/13/20): K 4, creatinine 0.97  Labs (2/22): K 3.7, creatinine 0.76  PMH:  1. Chronic systolic CHF: Nonischemic cardiomyopathy.  No family history of cardiomyopathy, no heavy ETOH, cocaine, or amphetamines.  Medtronic ICD.  - Echo (12/16) with EF 20-25%, moderate MR.  - Echo (5/17) with severely dilated LV, EF 10-15%, severe functional MR, mildly decreased RV systolic function. - LHC/RHC (5/05): No significant coronary disease; mean RA 15, PA 61/25 mean 41, mean PCWP 18, CI 1.7 (Fick), PVR 5.4 WU.  - Echo (10/17): EF 20%, severely dilated LV, normal RV size with mildly decreased systolic function, moderate-severe central MR.  - Echo 04/13/17  LVEF 15-20%, Severe LV dilation, Mod LAE, Mild RAE - CPX (5/19): RER 0.95,  peak VO2 9.8, VE/VCO2 26 => submaximal but likely severe functional impairement.  - Echo (7/19): EF 20-25%, moderate LV dilation, RV normal size with mildly decreased systolic function, moderate central MR.  - Echo (10/20): EF < 20%, severe LV enlargement, normal RV>  - RHC (10/20): mean RA 7, PA 65/25, mean PCWP 27, CI 1.8, PVR 3.87, PAPI 5.7.  2. Morbid  obesity.  3. 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  Echo (10/17) with moderate-severe MR. Echo (7/19) with moderate central MR.  4. OSA: CPAP.  5. SVT  Current Outpatient Medications  Medication Sig Dispense Refill  . atorvastatin (LIPITOR) 10 MG tablet Take 1 tablet (10 mg total) by mouth daily. 90 tablet 3  . carvedilol (COREG) 12.5 MG tablet Take 1 tablet (12.5 mg total) by mouth 2 (two) times daily. Dose increase 180 tablet 2  . CORLANOR 7.5 MG TABS tablet TAKE 1 TABLET (7.5 MG TOTAL) BY MOUTH 2 (TWO) TIMES DAILY WITH A MEAL (AM+BEDTIME) 60 tablet 3  . dapagliflozin propanediol (FARXIGA) 10 MG TABS tablet Take 10 mg by mouth in the morning.    . digoxin (LANOXIN) 0.125 MG tablet Take 1 tablet (0.125 mg total) by mouth daily. 90 tablet 3  . Dulaglutide (TRULICITY) 1.5 MG/0.5ML SOPN Inject 1.5 mg into the skin once a week.    Marland Kitchen ipratropium-albuterol (DUONEB) 0.5-2.5 (3) MG/3ML SOLN Take 3 mLs by nebulization every 4 (four) hours as needed. 360 mL 0  . levonorgestrel (LILETTA, 52 MG,) 19.5 MCG/DAY IUD IUD 1 each by Intrauterine route once.    . Magnesium Oxide 400 (240 Mg) MG TABS Take 2 tablets by mouth every morning and 1 tablet by mouth every evening 90 tablet 6  . metolazone (ZAROXOLYN) 2.5 MG tablet As directed by HF clinic 10 tablet 3  . milrinone (PRIMACOR) 20 MG/100 ML SOLN infusion Inject 0.0414 mg/min into the vein continuous.    . potassium chloride (KLOR-CON) 10 MEQ tablet TAKE 8 TABS IN THE MORNING, 6 TABS IN THE AFTERNOON, AND 6 TABS IN THE EVENING 600 tablet 3  . PRESCRIPTION MEDICATION CPAP- AT BEDTIME    . sacubitril-valsartan (ENTRESTO) 49-51 MG Take 1 tablet by mouth 2 (two) times daily. Needs appointment for further refill. 60 tablet 0  . spironolactone (ALDACTONE) 25 MG tablet TAKE ONE TABLET BY MOUTH ONCE DAILY (NOON) 90 tablet 3  . torsemide (DEMADEX) 100 MG tablet Take 1 tablet (100 mg total) by mouth 2 (two) times daily. 60 tablet 3   No current  facility-administered medications for this encounter.    Allergies  Allergen Reactions  . Metformin Diarrhea    And caused severe "dry mouth," also  . Metformin And Related Diarrhea    And caused severe "dry mouth," also      Social History   Socioeconomic History  . Marital status: Single    Spouse name: Not on file  . Number of children: 2  . Years of education: Not on file  . Highest education level: Some college, no degree  Occupational History  . Occupation: Disability  Tobacco Use  . Smoking status: Former Smoker    Types: Cigarettes    Quit date: 07/18/2016    Years since quitting: 4.3  . Smokeless tobacco: Never Used  . Tobacco comment: 3 cigarettes per day  Vaping Use  . Vaping Use: Never used  Substance and Sexual Activity  . Alcohol use: Not Currently    Alcohol/week: 0.0 standard drinks    Comment: occasionally  .  Drug use: Not Currently    Types: Marijuana    Comment: only socially  . Sexual activity: Yes    Birth control/protection: None  Other Topics Concern  . Not on file  Social History Narrative  . Not on file   Social Determinants of Health   Financial Resource Strain: High Risk  . Difficulty of Paying Living Expenses: Hard  Food Insecurity: Food Insecurity Present  . Worried About Programme researcher, broadcasting/film/video in the Last Year: Sometimes true  . Ran Out of Food in the Last Year: Sometimes true  Transportation Needs: No Transportation Needs  . Lack of Transportation (Medical): No  . Lack of Transportation (Non-Medical): No  Physical Activity: Not on file  Stress: Not on file  Social Connections: Not on file  Intimate Partner Violence: Not on file     Family History  Problem Relation Age of Onset  . Heart attack Mother   . Asthma Mother   . Heart failure Mother   . Hypertension Father   . Diabetes Brother     Vitals:   11/09/20 0835  BP: 100/60  Pulse: 100  SpO2: 96%  Weight: (!) 160.5 kg (353 lb 12.8 oz)   Wt Readings from Last 3  Encounters:  11/09/20 (!) 160.5 kg (353 lb 12.8 oz)  10/24/20 (!) 160.1 kg (353 lb)  10/04/20 (!) 166.6 kg (367 lb 3.2 oz)   Physical Exam: General:  NAD. No resp difficulty HEENT: Normal Neck: Supple. No JVD. Carotids 2+ bilat; no bruits. No lymphadenopathy or thryomegaly appreciated. Cor: PMI nondisplaced. Regular rate & rhythm. No rubs, gallops or murmurs; Right upper chest tunneled catheter. Lungs: Clear Abdomen:  Morbidly obese, soft, nontender, nondistended. No hepatosplenomegaly. No bruits or masses. Good bowel sounds. Extremities: No cyanosis, clubbing, rash, edema Neuro: Alert & oriented x 3, cranial nerves grossly intact. Moves all 4 extremities w/o difficulty. Affect pleasant.  ASSESSMENT & PLAN: 1. Chronic systolic CHF:  Nonischemic cardiomyopathy.  Body habitus precludes cardiac MRI.  Echo 06/2019 with EF < 20%, relatively normal RV, moderate MR.  RHC 06/20/20 with low output (CI 1.8) and elevated PCWP but near normal RA pressure, started on milrinone 0.25 with plans to continue home milrinone as bridge to possible LVAD if she could achieve significant weight loss.  Unfortunately, she has not succeeded in losing any weight.  - Stable NYHA II. Volume status good today.  - Continue torsemide 100 mg bid. - Remains on milrinone. Weekly labs. - Continue Coreg 12.5 mg bid. Will not increase today with SBP 100. - Continue Entresto to 49/51 mg bid  - Continue ivabradine to 7.5 mg bid. HR 100 today. - Continue spironolactone 25 mg daily.  - Continue digoxin 0.125 mg. Will add digoxin level to next lab draw. - Continue Farxiga 10 mg daily.  - Discussed that she needs to prevent pregnancy. She now has an IUD, placed 2 weeks ago. Will check UPT today as she has not had follow up GYN appt to check position and strings of IUD. -  VAD consideration: Her size and social support will be issues.  She would not be a transplant candidate at this point due to weight. We discussed her at [facility], Dr.  Donata Clay has seen.  She will need significant weight loss to get to LVAD.  Have been using home milrinone to try to facilitate increased activity to work towards weight loss.  She understands that milrinone is not a good end-point and that we would like to  use it as a bridge to eventual LVAD. RV function seems adequate for LVAD.  However, she has made no progress towards weight loss.  Continue HF Paramedicine.  2. Morbid obesity:  Body mass index is 60.73 kg/m. - Turned down for bariatric surgery due to insurance.  - Discussed portion control.  3. SVT: had ED visit 10/11 for SVT noted by EMS that converted back to NSR after dose of adenosine. This was in the setting of hypokalemia and hypomagnesemia, at 3.0 and 1.5 respectively. Also in the setting of inotropic therapy w/ milrinone.   4. OSA: Will re-order sleep study. 5. DMII: Hgb A1C 8.7. On Farxiga + Trulicity.   Follow up in 4-6 weeks. Continue HF paramedicine.   Anderson Malta Osborne, FNP-BC 11/09/20

## 2020-11-09 ENCOUNTER — Other Ambulatory Visit (HOSPITAL_COMMUNITY): Payer: Self-pay

## 2020-11-09 ENCOUNTER — Other Ambulatory Visit: Payer: Self-pay

## 2020-11-09 ENCOUNTER — Ambulatory Visit (HOSPITAL_COMMUNITY)
Admission: RE | Admit: 2020-11-09 | Discharge: 2020-11-09 | Disposition: A | Discharge status: Home / Self Care | Payer: Medicaid Other | Source: Ambulatory Visit | Attending: Family Medicine | Admitting: Family Medicine

## 2020-11-09 ENCOUNTER — Encounter (HOSPITAL_COMMUNITY): Payer: Self-pay

## 2020-11-09 VITALS — BP 100/60 | HR 100 | Wt 353.8 lb

## 2020-11-09 DIAGNOSIS — Z9989 Dependence on other enabling machines and devices: Secondary | ICD-10-CM | POA: Insufficient documentation

## 2020-11-09 DIAGNOSIS — Z95810 Presence of automatic (implantable) cardiac defibrillator: Secondary | ICD-10-CM | POA: Diagnosis not present

## 2020-11-09 DIAGNOSIS — I340 Nonrheumatic mitral (valve) insufficiency: Secondary | ICD-10-CM | POA: Insufficient documentation

## 2020-11-09 DIAGNOSIS — G4733 Obstructive sleep apnea (adult) (pediatric): Secondary | ICD-10-CM | POA: Diagnosis not present

## 2020-11-09 DIAGNOSIS — Z794 Long term (current) use of insulin: Secondary | ICD-10-CM | POA: Diagnosis not present

## 2020-11-09 DIAGNOSIS — Z975 Presence of (intrauterine) contraceptive device: Secondary | ICD-10-CM | POA: Diagnosis not present

## 2020-11-09 DIAGNOSIS — G4730 Sleep apnea, unspecified: Secondary | ICD-10-CM | POA: Diagnosis not present

## 2020-11-09 DIAGNOSIS — Z7984 Long term (current) use of oral hypoglycemic drugs: Secondary | ICD-10-CM | POA: Diagnosis not present

## 2020-11-09 DIAGNOSIS — Z79899 Other long term (current) drug therapy: Secondary | ICD-10-CM | POA: Diagnosis not present

## 2020-11-09 DIAGNOSIS — I5022 Chronic systolic (congestive) heart failure: Secondary | ICD-10-CM | POA: Diagnosis not present

## 2020-11-09 DIAGNOSIS — Z6841 Body Mass Index (BMI) 40.0 and over, adult: Secondary | ICD-10-CM | POA: Insufficient documentation

## 2020-11-09 DIAGNOSIS — Z8679 Personal history of other diseases of the circulatory system: Secondary | ICD-10-CM | POA: Diagnosis not present

## 2020-11-09 DIAGNOSIS — I471 Supraventricular tachycardia: Secondary | ICD-10-CM | POA: Diagnosis not present

## 2020-11-09 DIAGNOSIS — Z8249 Family history of ischemic heart disease and other diseases of the circulatory system: Secondary | ICD-10-CM | POA: Insufficient documentation

## 2020-11-09 DIAGNOSIS — E119 Type 2 diabetes mellitus without complications: Secondary | ICD-10-CM | POA: Diagnosis not present

## 2020-11-09 DIAGNOSIS — F17210 Nicotine dependence, cigarettes, uncomplicated: Secondary | ICD-10-CM | POA: Insufficient documentation

## 2020-11-09 DIAGNOSIS — I428 Other cardiomyopathies: Secondary | ICD-10-CM | POA: Diagnosis not present

## 2020-11-09 LAB — PREGNANCY, URINE: Preg Test, Ur: NEGATIVE

## 2020-11-09 NOTE — Patient Instructions (Signed)
It was great to see you today! No medication changes are needed at this time.  Your physician has recommended that you have a sleep study. This test records several body functions during sleep, including: brain activity, eye movement, oxygen and carbon dioxide blood levels, heart rate and rhythm, breathing rate and rhythm, the flow of air through your mouth and nose, snoring, body muscle movements, and chest and belly movement.  Keep follow up as scheduled in April 2022  Do the following things EVERYDAY: 1) Weigh yourself in the morning before breakfast. Write it down and keep it in a log. 2) Take your medicines as prescribed 3) Eat low salt foods--Limit salt (sodium) to 2000 mg per day.  4) Stay as active as you can everyday 5) Limit all fluids for the day to less than 2 liters  At the [facility], you and your health needs are our priority. As part of our continuing mission to provide you with exceptional heart care, we have created designated Provider Care Teams. These Care Teams include your primary Cardiologist (physician) and Advanced Practice Providers (APPs- Physician Assistants and Nurse Practitioners) who all work together to provide you with the care you need, when you need it.   You may see any of the following providers on your designated Care Team at your next follow up: Marland Kitchen Dr Arvilla Meres . Dr Marca Ancona . Dr Thornell Mule . Tonye Becket, NP . Robbie Lis, PA . Shanda Bumps [place],NP . Karle Plumber, PharmD   Please be sure to bring in all your medications bottles to every appointment.   If you have any questions or concerns before your next appointment please send [place] a message through [place] or call our office at [phone].    TO LEAVE A MESSAGE FOR THE NURSE SELECT OPTION 2, PLEASE LEAVE A MESSAGE INCLUDING: . YOUR NAME . DATE OF BIRTH . CALL BACK NUMBER . REASON FOR CALL**this is important as we prioritize the call backs  YOU WILL  RECEIVE A CALL BACK THE SAME DAY AS LONG AS YOU CALL BEFORE 4:00 PM

## 2020-11-09 NOTE — Progress Notes (Signed)
Paramedicine Encounter   [name] , female,   DOB: Dec 14, 1980,39 y.o.,  MRN: [id number]   Met patient in clinic today with provider.   Weight @ clinic-353 B/p-100/60 p-100 [id number]  Sleep study ordered today.  No med changes noted.  Fluid level looks good today.   [name], [place] 11/09/2020

## 2020-11-13 ENCOUNTER — Other Ambulatory Visit: Payer: Self-pay

## 2020-11-13 ENCOUNTER — Ambulatory Visit: Payer: Medicaid Other | Attending: Internal Medicine | Admitting: Internal Medicine

## 2020-11-13 DIAGNOSIS — I428 Other cardiomyopathies: Secondary | ICD-10-CM | POA: Diagnosis not present

## 2020-11-13 DIAGNOSIS — G4733 Obstructive sleep apnea (adult) (pediatric): Secondary | ICD-10-CM

## 2020-11-13 DIAGNOSIS — E1142 Type 2 diabetes mellitus with diabetic polyneuropathy: Secondary | ICD-10-CM | POA: Diagnosis not present

## 2020-11-13 DIAGNOSIS — F17200 Nicotine dependence, unspecified, uncomplicated: Secondary | ICD-10-CM | POA: Diagnosis not present

## 2020-11-13 DIAGNOSIS — I5022 Chronic systolic (congestive) heart failure: Secondary | ICD-10-CM | POA: Diagnosis not present

## 2020-11-13 DIAGNOSIS — E1169 Type 2 diabetes mellitus with other specified complication: Secondary | ICD-10-CM

## 2020-11-13 MED ORDER — ACCU-CHEK SOFTCLIX LANCETS MISC: 12 refills | Status: AC

## 2020-11-13 MED ORDER — GABAPENTIN 300 MG PO CAPS: 300.0000 mg | ORAL_CAPSULE | Freq: Every day | ORAL | 3 refills | Status: DC

## 2020-11-13 MED ORDER — ACCU-CHEK GUIDE W/DEVICE KIT: PACK | 0 refills | Status: AC

## 2020-11-13 MED ORDER — TRUE METRIX METER W/DEVICE KIT: PACK | 0 refills | Status: AC

## 2020-11-13 MED ORDER — ACCU-CHEK GUIDE VI STRP: ORAL_STRIP | 12 refills | Status: AC

## 2020-11-13 MED ORDER — TRUEPLUS LANCETS 28G MISC: 4 refills | Status: DC

## 2020-11-13 MED ORDER — TRUE METRIX BLOOD GLUCOSE TEST VI STRP: ORAL_STRIP | 12 refills | Status: AC

## 2020-11-13 NOTE — Progress Notes (Signed)
Virtual Visit via Telephone Note  I connected with Kirsten Wheeler on 11/13/20 at 1:42 p.m by telephone and verified that I am speaking with the correct person using two identifiers.  Location: Patient: home Provider: office  The patient, my CMA Ms. Donata Duff and myself participated in this visit. I discussed the limitations, risks, security and privacy concerns of performing an evaluation and management service by telephone and the availability of in person appointments. I also discussed with the patient that there may be a patient responsible charge related to this service. The patient expressed understanding and agreed to proceed.   History of Present Illness: Patient with history of NICM with ICD, chronic systolic CHF end-stage on milrinone infusion once a week as a bridge to LVAD (not a candidate for transplant due to her weight), MR moderate, asthma, OSA on CPAP, tobacco dependence, morbid obesity, DM.  Last seen 12/21.    Since last visit, she became pregnant.  Pregnancy was terminated upon advise of OB given her chronic medical conditions.  Subsequently got IUD.   DM/Obesity:  Just started with [employer] last wk.  Will be going 2 x a wk.  -No significant wgh loss since last visit. -Trulicity has decreased her appetite. Misses meals She was going to [employer] in past through [facility]. She was working towards having wgh reduction surgery.  Told she needed to loss wgh. She did but then they could not find a surgery who was will to do the surgery with her being on Milrinone. -no device to check BS -reports increase numbness in feet.  Use to be intermittent but now lasting longer  NICM/ICD: followed regularly by cardiology.  Has to lose some wgh before she can become a candidate for left ventricular assisted device  OSA: cardiology set her up for sleep study on 12/29/2020. She was never called for desensitization.  Tob dep:  Reports she has slowed down a lot.  Down from 1 pk to 3 cigarettes a  day.  Did not like the nicotine gum after taste.     Outpatient Encounter Medications as of 11/13/2020  Medication Sig  . atorvastatin (LIPITOR) 10 MG tablet Take 1 tablet (10 mg total) by mouth daily.  . carvedilol (COREG) 12.5 MG tablet Take 1 tablet (12.5 mg total) by mouth 2 (two) times daily. Dose increase  . CORLANOR 7.5 MG TABS tablet TAKE 1 TABLET (7.5 MG TOTAL) BY MOUTH 2 (TWO) TIMES DAILY WITH A MEAL (AM+BEDTIME)  . dapagliflozin propanediol (FARXIGA) 10 MG TABS tablet Take 10 mg by mouth in the morning.  . digoxin (LANOXIN) 0.125 MG tablet Take 1 tablet (0.125 mg total) by mouth daily.  . Dulaglutide (TRULICITY) 1.5 MG/0.5ML SOPN Inject 1.5 mg into the skin once a week.  Marland Kitchen ipratropium-albuterol (DUONEB) 0.5-2.5 (3) MG/3ML SOLN Take 3 mLs by nebulization every 4 (four) hours as needed.  Marland Kitchen levonorgestrel (LILETTA, 52 MG,) 19.5 MCG/DAY IUD IUD 1 each by Intrauterine route once.  . Magnesium Oxide 400 (240 Mg) MG TABS Take 2 tablets by mouth every morning and 1 tablet by mouth every evening  . metolazone (ZAROXOLYN) 2.5 MG tablet As directed by HF clinic  . milrinone (PRIMACOR) 20 MG/100 ML SOLN infusion Inject 0.0414 mg/min into the vein continuous.  . potassium chloride (KLOR-CON) 10 MEQ tablet TAKE 8 TABS IN THE MORNING, 6 TABS IN THE AFTERNOON, AND 6 TABS IN THE EVENING  . PRESCRIPTION MEDICATION CPAP- AT BEDTIME  . sacubitril-valsartan (ENTRESTO) 49-51 MG Take 1 tablet by mouth  2 (two) times daily. Needs appointment for further refill.  . spironolactone (ALDACTONE) 25 MG tablet TAKE ONE TABLET BY MOUTH ONCE DAILY (NOON)  . torsemide (DEMADEX) 100 MG tablet Take 1 tablet (100 mg total) by mouth 2 (two) times daily.   No facility-administered encounter medications on file as of 11/13/2020.      Observations/Objective: Lab Results  Component Value Date   HGBA1C 8.7 (A) 08/24/2020     Chemistry      Component Value Date/Time   NA 136 08/24/2020 1000   K 3.5 08/24/2020 1000    CL 92 (L) 08/24/2020 1000   CO2 28 08/24/2020 1000   BUN 16 08/24/2020 1000   CREATININE 1.09 (H) 08/24/2020 1000      Component Value Date/Time   CALCIUM 10.3 (H) 08/24/2020 1000   ALKPHOS 74 08/24/2020 1000   AST 18 08/24/2020 1000   ALT 22 08/24/2020 1000   BILITOT 0.8 08/24/2020 1000     Lab Results  Component Value Date   WBC 12.4 (H) 08/24/2020   HGB 15.8 08/24/2020   HCT 46.5 08/24/2020   MCV 81 08/24/2020   PLT 333 08/24/2020      Assessment and Plan: 1. Type 2 diabetes mellitus with morbid obesity (HCC) Diabetic testing supplies sent to her pharmacy.  Advised to check blood sugars at least once in the mornings before breakfast.  Goal of blood sugar before meals 90-130.  Encourage healthy eating habits. She is agreeable to referral to medical weight management to see if it would help her in achieving weight loss so that she can get the left ventricular assist device. - Amb Ref to Medical Weight Management - Hemoglobin A1c; Future  2. Diabetic peripheral neuropathy (HCC) Recommend trying a low-dose of gabapentin at bedtime.  Patient agreeable to this. - gabapentin (NEURONTIN) 300 MG capsule; Take 1 capsule (300 mg total) by mouth at bedtime.  Dispense: 30 capsule; Refill: 3  3. OSA (obstructive sleep apnea) Plan for sleep study in April.  4. Tobacco dependence Commended her on cutting back.  Encouraged her to set a quit date.  Less than 5 minutes spent on counseling.  5. NICM (nonischemic cardiomyopathy) (HCC) 6. Chronic systolic CHF (congestive heart failure) (HCC) Followed by cardiology.   Follow Up Instructions: 4 mths   I discussed the assessment and treatment plan with the patient. The patient was provided an opportunity to ask questions and all were answered. The patient agreed with the plan and demonstrated an understanding of the instructions.   The patient was advised to call back or seek an in-person evaluation if the symptoms worsen or if the  condition fails to improve as anticipated.  I provided 18 minutes of non-face-to-face time during this encounter.   Jonah Blue, MD

## 2020-11-15 DIAGNOSIS — G4733 Obstructive sleep apnea (adult) (pediatric): Secondary | ICD-10-CM | POA: Diagnosis not present

## 2020-11-15 DIAGNOSIS — O903 Peripartum cardiomyopathy: Secondary | ICD-10-CM | POA: Diagnosis not present

## 2020-11-15 DIAGNOSIS — Z6841 Body Mass Index (BMI) 40.0 and over, adult: Secondary | ICD-10-CM | POA: Diagnosis not present

## 2020-11-15 DIAGNOSIS — I5022 Chronic systolic (congestive) heart failure: Secondary | ICD-10-CM | POA: Diagnosis not present

## 2020-11-15 DIAGNOSIS — E119 Type 2 diabetes mellitus without complications: Secondary | ICD-10-CM | POA: Diagnosis not present

## 2020-11-15 DIAGNOSIS — Z452 Encounter for adjustment and management of vascular access device: Secondary | ICD-10-CM | POA: Diagnosis not present

## 2020-11-15 DIAGNOSIS — I5023 Acute on chronic systolic (congestive) heart failure: Secondary | ICD-10-CM | POA: Diagnosis not present

## 2020-11-15 DIAGNOSIS — J45909 Unspecified asthma, uncomplicated: Secondary | ICD-10-CM | POA: Diagnosis not present

## 2020-11-15 DIAGNOSIS — I340 Nonrheumatic mitral (valve) insufficiency: Secondary | ICD-10-CM | POA: Diagnosis not present

## 2020-11-15 DIAGNOSIS — I272 Pulmonary hypertension, unspecified: Secondary | ICD-10-CM | POA: Diagnosis not present

## 2020-11-15 DIAGNOSIS — Z7984 Long term (current) use of oral hypoglycemic drugs: Secondary | ICD-10-CM | POA: Diagnosis not present

## 2020-11-15 DIAGNOSIS — F17210 Nicotine dependence, cigarettes, uncomplicated: Secondary | ICD-10-CM | POA: Diagnosis not present

## 2020-11-15 DIAGNOSIS — Z79899 Other long term (current) drug therapy: Secondary | ICD-10-CM | POA: Diagnosis not present

## 2020-11-21 ENCOUNTER — Ambulatory Visit: Payer: Medicaid Other | Admitting: Family Medicine

## 2020-11-21 NOTE — BH Specialist Note (Signed)
Integrated Behavioral Health via Telemedicine Visit  11/21/2020 Kirsten Wheeler [id number]  Number of Integrated Behavioral Health visits: 2 Session Start time: 2:51  Session End time: 3:21 Total time: 30  Referring Provider: Merian Capron, MD Patient/Family location: Home [facility] Provider location: Center for [facility]  All persons participating in visit: Patient Kirsten Wheeler and Kirsten Wheeler   Types of Service: Individual psychotherapy and Video visit  I connected with Kirsten Wheeler and/or Kirsten Wheeler's n/a via  Telephone or Video Enabled Telemedicine Application  (Video is Caregility application) and verified that I am speaking with Kirsten correct person using two identifiers. Discussed confidentiality: Yes   I discussed Kirsten limitations of telemedicine and Kirsten availability of in person appointments.  Discussed there is a possibility of technology failure and discussed alternative modes of communication if that failure occurs.  I discussed that engaging in this telemedicine visit, they consent to Kirsten provision of behavioral healthcare and Kirsten services will be billed under their insurance.  Patient and/or legal guardian expressed understanding and consented to Telemedicine visit: Yes   Presenting Concerns: Patient and/or family reports Kirsten following symptoms/concerns: Pt states her primary concern today is ongoing bleeding since loss on 1/29, while she and her family continue to grieve. Sleep is starting to improve, [employer] weight loss class is going well; pt looks forward to moving to new home next month.  Duration of problem: Less than two months  Patient and/or Family's Strengths/Protective Factors: Sense of purpose  Goals Addressed: Patient will: 1.  Reduce symptoms of: stress  2.  Demonstrate ability to: Continue healthy grieving over loss  Progress towards Goals: Ongoing  Interventions: Interventions utilized:  Supportive  Counseling Standardized Assessments completed: Not Needed  Patient and/or Family Response: Pt agrees with revised treatment plan  Assessment: Patient currently experiencing Grief.   Patient may benefit from supportive counseling today .  Plan: 1. Follow up with behavioral health clinician on : Call Kirsten Wheeler at [phone] after move to new home to set up follow up visit 2. Behavioral recommendations:  -Discuss bleeding concern at upcoming medical visit -Continue allowing time and space for feelings of grie -Continue participating in [employer] Weight Loss Program Twice weekly -Continue with plan to move into new home and to resume crafting projects and begin a memorial wall  3. Referral(s): Integrated [employer] (In Clinic)  I discussed Kirsten assessment and treatment plan with Kirsten patient and/or parent/guardian. They were provided an opportunity to ask questions and all were answered. They agreed with Kirsten plan and demonstrated an understanding of Kirsten instructions.   They were advised to call back or seek an in-person evaluation if Kirsten symptoms worsen or if Kirsten condition fails to improve as anticipated.  Kirsten Close Andreana Klingerman, LCSW

## 2020-11-22 DIAGNOSIS — Z452 Encounter for adjustment and management of vascular access device: Secondary | ICD-10-CM | POA: Diagnosis not present

## 2020-11-22 DIAGNOSIS — F17210 Nicotine dependence, cigarettes, uncomplicated: Secondary | ICD-10-CM | POA: Diagnosis not present

## 2020-11-22 DIAGNOSIS — I5023 Acute on chronic systolic (congestive) heart failure: Secondary | ICD-10-CM | POA: Diagnosis not present

## 2020-11-22 DIAGNOSIS — Z79899 Other long term (current) drug therapy: Secondary | ICD-10-CM | POA: Diagnosis not present

## 2020-11-22 DIAGNOSIS — I5022 Chronic systolic (congestive) heart failure: Secondary | ICD-10-CM | POA: Diagnosis not present

## 2020-11-22 DIAGNOSIS — E119 Type 2 diabetes mellitus without complications: Secondary | ICD-10-CM | POA: Diagnosis not present

## 2020-11-22 DIAGNOSIS — G4733 Obstructive sleep apnea (adult) (pediatric): Secondary | ICD-10-CM | POA: Diagnosis not present

## 2020-11-22 DIAGNOSIS — Z6841 Body Mass Index (BMI) 40.0 and over, adult: Secondary | ICD-10-CM | POA: Diagnosis not present

## 2020-11-22 DIAGNOSIS — I340 Nonrheumatic mitral (valve) insufficiency: Secondary | ICD-10-CM | POA: Diagnosis not present

## 2020-11-22 DIAGNOSIS — J45909 Unspecified asthma, uncomplicated: Secondary | ICD-10-CM | POA: Diagnosis not present

## 2020-11-22 DIAGNOSIS — I272 Pulmonary hypertension, unspecified: Secondary | ICD-10-CM | POA: Diagnosis not present

## 2020-11-22 DIAGNOSIS — Z7984 Long term (current) use of oral hypoglycemic drugs: Secondary | ICD-10-CM | POA: Diagnosis not present

## 2020-11-22 DIAGNOSIS — O903 Peripartum cardiomyopathy: Secondary | ICD-10-CM | POA: Diagnosis not present

## 2020-11-23 ENCOUNTER — Other Ambulatory Visit (HOSPITAL_COMMUNITY): Payer: Self-pay

## 2020-11-23 ENCOUNTER — Other Ambulatory Visit (HOSPITAL_COMMUNITY): Payer: Self-pay | Admitting: Cardiology

## 2020-11-23 NOTE — Progress Notes (Signed)
Paramedicine Encounter    [name], female    DOB: [birth year], 39 y.o.   MRN: [id number]   Patient Care Team: Ladell Pier, MD as PCP - General (Internal Medicine) Constance Haw, MD as PCP - Electrophysiology (Cardiology) Larey Dresser, MD as PCP - Advanced Heart Failure (Cardiology)  Patient Active Problem List   Diagnosis Date Noted  . IUD (intrauterine device) in place 10/25/2020  . Influenza vaccine refused 08/24/2020  . Anxiety and depression 08/24/2020  . 23-polyvalent pneumococcal polysaccharide vaccine declined 08/24/2020  . Tobacco dependence 08/24/2020  . Acute on chronic systolic CHF (congestive heart failure) ([place]) 06/17/2019  . Obstructive sleep apnea 06/17/2019  . 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 Diabetes mellitus ([place]) 06/17/2019  . Amenorrhea 08/03/2018  . Congestive heart failure ([place]) 06/29/2018  . Hypotension 06/07/2018  . Rhinovirus infection 06/06/2018  . Asthma 06/05/2018  . Hypokalemia 06/05/2018  . Snoring 09/23/2016  . Cough 04/10/2016  . NICM (nonischemic cardiomyopathy) ([place]) 01/25/2016  . Tobacco abuse 01/25/2016  . Acute on chronic combined systolic and diastolic CHF (congestive heart failure) ([place]) 01/25/2016  . Mitral regurgitation   . Abnormal EKG-inf TWI 01/15/2016  . Positive D dimer-CTA neg for PE 01/15/2016  . Morbid obesity- BMI 57 01/14/2016  . Abdominal pain 01/14/2016  . 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 At risk for sleep apnea 01/14/2016  . Cardiomyopathy ([place]) 11/01/2015  . Morbid obesity with BMI of 50.0-59.9, adult ([place]) 08/16/2015    Current Outpatient Medications:  .  atorvastatin (LIPITOR) 10 MG tablet, Take 1 tablet (10 mg total) by mouth daily., Disp: 90 tablet, Rfl: 3 .  carvedilol (COREG) 12.5 MG tablet, Take 1 tablet (12.5 mg total) by mouth 2 (two) times daily. Dose increase, Disp: 180 tablet, Rfl: 2 .  CORLANOR 7.5 MG TABS tablet, TAKE 1 TABLET  (7.5 MG TOTAL) BY MOUTH 2 (TWO) TIMES DAILY WITH A MEAL (AM+BEDTIME), Disp: 60 tablet, Rfl: 3 .  dapagliflozin propanediol (FARXIGA) 10 MG TABS tablet, Take 10 mg by mouth in the morning., Disp: , Rfl:  .  digoxin (LANOXIN) 0.125 MG tablet, Take 1 tablet (0.125 mg total) by mouth daily., Disp: 90 tablet, Rfl: 3 .  Dulaglutide (TRULICITY) 1.5 VZ/5.6LO SOPN, Inject 1.5 mg into the skin once a week., Disp: , Rfl:  .  levonorgestrel (LILETTA, 52 MG,) 19.5 MCG/DAY IUD IUD, 1 each by Intrauterine route once., Disp: , Rfl:  .  Magnesium Oxide 400 (240 Mg) MG TABS, Take 2 tablets by mouth every morning and 1 tablet by mouth every evening, Disp: 90 tablet, Rfl: 6 .  metolazone (ZAROXOLYN) 2.5 MG tablet, As directed by HF clinic (Patient taking differently: Takes on wednesdays.), Disp: 10 tablet, Rfl: 3 .  milrinone (PRIMACOR) 20 MG/100 ML SOLN infusion, Inject 0.0414 mg/min into the vein continuous., Disp:  , Rfl:  .  potassium chloride (KLOR-CON) 10 MEQ tablet, TAKE 8 TABS IN THE MORNING, 6 TABS IN THE AFTERNOON, AND 6 TABS IN THE EVENING, Disp: 600 tablet, Rfl: 3 .  PRESCRIPTION MEDICATION, CPAP- AT BEDTIME, Disp: , Rfl:  .  sacubitril-valsartan (ENTRESTO) 49-51 MG, Take 1 tablet by mouth 2 (two) times daily. Needs appointment for further refill., Disp: 60 tablet, Rfl: 0 .  spironolactone (ALDACTONE) 25 MG tablet, TAKE ONE TABLET BY MOUTH ONCE DAILY (NOON), Disp: 90 tablet, Rfl: 3 .  torsemide (DEMADEX) 100 MG tablet, Take 1 tablet (100 mg total) by mouth 2 (two) times daily., Disp: 60 tablet,  Rfl: 3 .  Accu-Chek Softclix Lancets lancets, Use as instructed (Patient not taking: Reported on 11/23/2020), Disp: 100 each, Rfl: 12 .  Blood Glucose Monitoring Suppl (ACCU-CHEK GUIDE) w/Device KIT, Use as directed (Patient not taking: Reported on 11/23/2020), Disp: 1 kit, Rfl: 0 .  Blood Glucose Monitoring Suppl (TRUE METRIX METER) w/Device KIT, Use as directed (Patient not taking: Reported on 11/23/2020), Disp: 1 kit,  Rfl: 0 .  gabapentin (NEURONTIN) 300 MG capsule, Take 1 capsule (300 mg total) by mouth at bedtime. (Patient not taking: Reported on 11/23/2020), Disp: 30 capsule, Rfl: 3 .  glucose blood (ACCU-CHEK GUIDE) test strip, Check blood sugars daily before breakfast. (Patient not taking: Reported on 11/23/2020), Disp: 100 each, Rfl: 12 .  glucose blood (TRUE METRIX BLOOD GLUCOSE TEST) test strip, Use as instructed (Patient not taking: Reported on 11/23/2020), Disp: 100 each, Rfl: 12 .  ipratropium-albuterol (DUONEB) 0.5-2.5 (3) MG/3ML SOLN, Take 3 mLs by nebulization every 4 (four) hours as needed. (Patient not taking: Reported on 11/23/2020), Disp: 360 mL, Rfl: 0 Allergies  Allergen Reactions  . Metformin Diarrhea    And caused severe "dry mouth," also  . Metformin And Related Diarrhea    And caused severe "dry mouth," also      Social History   Socioeconomic History  . Marital status: Single    Spouse name: Not on file  . Number of children: 2  . Years of education: Not on file  . Highest education level: Some college, no degree  Occupational History  . Occupation: Disability  Tobacco Use  . Smoking status: Former Smoker    Types: Cigarettes    Quit date: 07/18/2016    Years since quitting: 4.3  . Smokeless tobacco: Never Used  . Tobacco comment: 3 cigarettes per day  Vaping Use  . Vaping Use: Never used  Substance and Sexual Activity  . Alcohol use: Not Currently    Alcohol/week: 0.0 standard drinks    Comment: occasionally  . Drug use: Not Currently    Types: Marijuana    Comment: only socially  . Sexual activity: Yes    Birth control/protection: None  Other Topics Concern  . Not on file  Social History Narrative  . Not on file   Social Determinants of Health   Financial Resource Strain: High Risk  . Difficulty of Paying Living Expenses: Hard  Food Insecurity: Not on file  Transportation Needs: Not on file  Physical Activity: Not on file  Stress: Not on file  Social  Connections: Not on file  Intimate Partner Violence: Not on file    Physical Exam      Future Appointments  Date Time Provider [place]  11/29/2020  2:45 PM Leola [facility]  12/05/2020  1:15 PM Clarnce Flock, MD [facility]  12/29/2020  9:00 AM Larey Dresser, MD [facility] None  12/29/2020  8:00 PM Sueanne Margarita, MD [facility]  01/22/2021  8:35 AM [facility] DEVICE REMOTES [facility]  03/13/2021  1:30 PM Ladell Pier, MD [facility] None  04/23/2021  8:35 AM [facility] DEVICE REMOTES [facility]  07/23/2021  8:35 AM [facility] DEVICE REMOTES [facility]  10/22/2021  8:35 AM [facility] DEVICE REMOTES [facility]  01/21/2022  8:35 AM [facility] DEVICE REMOTES [facility]    BP (!) 106/0   Pulse 95   Resp 20   SpO2 98%   Weight yesterday-351 Last visit weight-353  Pt reports she is doing alright, she finally quit  having vaginal bleeding. She missed her last ob/gyn appoint-she did resch it.  She feels like she is doing better with taking her meds.  Her PCP prescribed her new glucometer and sent it to [facility]. Also was prescribed gabapentin for pain in her feet. She has picked that one up yet.  She goes to obgyn next week.  Her weight is stable. 350-353 at home.  She is skipping on her night time dose of the 68mq due to some due to abd pain. She said she will do better with taking all doses.  Checked her bubble packs and they were correct.  She had a cousin that just started staying with her, the cousin, spouse and a small child. The cousin is an alcoholic and is hoping to get into some rehab to detox. So pt has some other worries and stress right now.  Pt is going next week to fill out some paperwork for the housing to [place] [facility].   KMarylouise Stacks EFredericksburgCWhite Fence Surgical SuitesParamedic  11/23/20

## 2020-11-24 ENCOUNTER — Ambulatory Visit: Payer: Medicaid Other | Admitting: Internal Medicine

## 2020-11-27 NOTE — Progress Notes (Signed)
[facility] [employer] PREP Weekly Session   Patient Details  Name: Kirsten Wheeler MRN: [id number] Date of Birth: 10-30-1980 Age: 40 y.o. PCP: Marcine Matar, MD  Vitals:   11/27/20 1214  Weight: (!) 352 lb (159.7 kg)     [facility] [employer] Weekly seesion - 11/27/20 1200      Weekly Session   Topic Discussed Stress management and problem solving    Minutes exercised this week --   cardio: 1-2 hrs/day; flexibility: 5-8 minutes   Classes attended to date 5            Mckenna Boruff B Jing Howatt 11/27/2020, 12:16 PM

## 2020-11-29 ENCOUNTER — Ambulatory Visit: Payer: Medicaid Other | Admitting: Clinical

## 2020-11-29 ENCOUNTER — Other Ambulatory Visit (HOSPITAL_COMMUNITY): Payer: Self-pay | Admitting: Cardiology

## 2020-11-29 DIAGNOSIS — I5023 Acute on chronic systolic (congestive) heart failure: Secondary | ICD-10-CM | POA: Diagnosis not present

## 2020-11-29 DIAGNOSIS — I340 Nonrheumatic mitral (valve) insufficiency: Secondary | ICD-10-CM | POA: Diagnosis not present

## 2020-11-29 DIAGNOSIS — F17210 Nicotine dependence, cigarettes, uncomplicated: Secondary | ICD-10-CM | POA: Diagnosis not present

## 2020-11-29 DIAGNOSIS — Z7984 Long term (current) use of oral hypoglycemic drugs: Secondary | ICD-10-CM | POA: Diagnosis not present

## 2020-11-29 DIAGNOSIS — Z6841 Body Mass Index (BMI) 40.0 and over, adult: Secondary | ICD-10-CM | POA: Diagnosis not present

## 2020-11-29 DIAGNOSIS — Z79899 Other long term (current) drug therapy: Secondary | ICD-10-CM | POA: Diagnosis not present

## 2020-11-29 DIAGNOSIS — E119 Type 2 diabetes mellitus without complications: Secondary | ICD-10-CM | POA: Diagnosis not present

## 2020-11-29 DIAGNOSIS — G4733 Obstructive sleep apnea (adult) (pediatric): Secondary | ICD-10-CM | POA: Diagnosis not present

## 2020-11-29 DIAGNOSIS — O903 Peripartum cardiomyopathy: Secondary | ICD-10-CM | POA: Diagnosis not present

## 2020-11-29 DIAGNOSIS — Z452 Encounter for adjustment and management of vascular access device: Secondary | ICD-10-CM | POA: Diagnosis not present

## 2020-11-29 DIAGNOSIS — J45909 Unspecified asthma, uncomplicated: Secondary | ICD-10-CM | POA: Diagnosis not present

## 2020-11-29 DIAGNOSIS — I5022 Chronic systolic (congestive) heart failure: Secondary | ICD-10-CM | POA: Diagnosis not present

## 2020-11-29 DIAGNOSIS — I272 Pulmonary hypertension, unspecified: Secondary | ICD-10-CM | POA: Diagnosis not present

## 2020-11-29 DIAGNOSIS — F4321 Adjustment disorder with depressed mood: Secondary | ICD-10-CM

## 2020-11-29 NOTE — Patient Instructions (Signed)
[facility] [address] [place], [place] [address] [phone] (main office) [phone] (Jalon Squier's office)   

## 2020-12-04 NOTE — Progress Notes (Signed)
[facility] [employer] PREP Weekly Session   Patient Details  Name: Kirsten Wheeler MRN: [id number] Date of Birth: July 21, 1981 Age: 40 y.o. PCP: Marcine Matar, MD  Vitals:   12/04/20 1000  Weight: (!) 351 lb (159.2 kg)     [facility] [employer] Weekly seesion - 12/04/20 1200      Weekly Session   Topic Discussed Expectations and non-scale victories    Minutes exercised this week 40 minutes   1 mile on bike   Classes attended to date 7            Damen Windsor B Darina Hartwell 12/04/2020, 12:11 PM

## 2020-12-05 ENCOUNTER — Other Ambulatory Visit (HOSPITAL_COMMUNITY): Payer: Self-pay | Admitting: Cardiology

## 2020-12-05 ENCOUNTER — Other Ambulatory Visit: Payer: Self-pay | Admitting: Internal Medicine

## 2020-12-05 ENCOUNTER — Other Ambulatory Visit: Payer: Self-pay | Admitting: Cardiology

## 2020-12-05 ENCOUNTER — Other Ambulatory Visit (HOSPITAL_COMMUNITY): Payer: Self-pay | Admitting: Adult Health

## 2020-12-05 ENCOUNTER — Ambulatory Visit (INDEPENDENT_AMBULATORY_CARE_PROVIDER_SITE_OTHER): Payer: Medicaid Other | Admitting: Family Medicine

## 2020-12-05 ENCOUNTER — Other Ambulatory Visit (HOSPITAL_COMMUNITY)
Admission: RE | Admit: 2020-12-05 | Discharge: 2020-12-05 | Disposition: A | Discharge status: Home / Self Care | Payer: Medicaid Other | Source: Ambulatory Visit | Attending: Family Medicine | Admitting: Family Medicine

## 2020-12-05 ENCOUNTER — Other Ambulatory Visit: Payer: Self-pay

## 2020-12-05 ENCOUNTER — Encounter: Payer: Self-pay | Admitting: Family Medicine

## 2020-12-05 VITALS — BP 94/66 | HR 91 | Ht 65.0 in | Wt 359.0 lb

## 2020-12-05 DIAGNOSIS — A5901 Trichomonal vulvovaginitis: Secondary | ICD-10-CM

## 2020-12-05 DIAGNOSIS — B9689 Other specified bacterial agents as the cause of diseases classified elsewhere: Secondary | ICD-10-CM | POA: Diagnosis not present

## 2020-12-05 DIAGNOSIS — Z975 Presence of (intrauterine) contraceptive device: Secondary | ICD-10-CM

## 2020-12-05 DIAGNOSIS — E1169 Type 2 diabetes mellitus with other specified complication: Secondary | ICD-10-CM

## 2020-12-05 DIAGNOSIS — Z8619 Personal history of other infectious and parasitic diseases: Secondary | ICD-10-CM | POA: Diagnosis not present

## 2020-12-05 DIAGNOSIS — N760 Acute vaginitis: Secondary | ICD-10-CM | POA: Diagnosis not present

## 2020-12-05 DIAGNOSIS — Z5901 Sheltered homelessness: Secondary | ICD-10-CM | POA: Insufficient documentation

## 2020-12-05 NOTE — Progress Notes (Signed)
GYNECOLOGY OFFICE VISIT NOTE  History:   Kirsten Wheeler is a 40 y.o. [id number] here today for IUD string check.  Liletta IUD placed on 10/24/2020 Reports that since that time she has had ongoing mild spotting No abdominal pain Continues to have some vaginal odor Worried since she had trichomonas on last swab that this could be related  Past Medical History:  Diagnosis Date  . Asthma   . Chronic systolic CHF (congestive heart failure) (HCC)    a. ECHO 01/15/2016 EF 10-15%. 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  . Diabetes mellitus (HCC) 06/17/2019  . Hearing loss    bilateral  . Mitral regurgitation    a. severe, felt to be functional 2/2 LV dilation   . Morbid obesity (HCC)   . Snoring     Past Surgical History:  Procedure Laterality Date  . CARDIAC CATHETERIZATION N/A 01/18/2016   Procedure: Right/Left Heart Cath and Coronary Angiography;  Surgeon: Laurey Morale, MD;  Location: [facility] INVASIVE CV LAB;  Service: Cardiovascular;  Laterality: N/A;  . CESAREAN SECTION    . ICD IMPLANT N/A 04/17/2017   Procedure: ICD Implant;  Surgeon: Regan Lemming, MD;  Location: [facility] INVASIVE CV LAB;  Service: Cardiovascular;  Laterality: N/A;  . IR FLUORO GUIDE CV LINE RIGHT  06/23/2019  . IR FLUORO GUIDE CV LINE RIGHT  10/15/2019  . IR REMOVAL TUN CV CATH W/O FL  10/15/2019  . IR US GUIDE VASC ACCESS RIGHT  06/23/2019  . IR US GUIDE VASC ACCESS RIGHT  10/15/2019  . LEAD REVISION/REPAIR N/A 04/21/2017   Procedure: Lead Revision/Repair;  Surgeon: Regan Lemming, MD;  [facility] INV[facility] CV [facility];  Service: Cardiovascular;  Laterality: N/A;  . RIGHT HEART CATH N/A 06/21/2019   Procedure: RIGHT HEART CATH;  Surgeon: Laurey Morale, MD;  Location: [facility] INVASIVE CV LAB;  Service: Cardiovascular;  Laterality: N/A;  . TONSILLECTOMY    . UNILATERAL SALPINGECTOMY  11/15/2007   unsure of which tube, ectopic pregnancy     The following portions of the patient's history were reviewed and updated as  appropriate: allergies, current medications, past family history, past medical history, past social history, past surgical history and problem list.   Health Maintenance:  Normal pap and negative HRHPV: 08/16/2020.  Normal mammogram: n/a.   Review of Systems:  Pertinent items noted in HPI and remainder of comprehensive ROS otherwise negative.  Physical Exam:  BP 94/66   Pulse 91   Ht 5\' 5"  (1.651 m)   Wt (!) 359 lb (162.8 kg)   BMI 59.74 kg/m  CONSTITUTIONAL: Well-developed, well-nourished female in no acute distress.  HEENT:  Normocephalic, atraumatic. External right and left ear normal. No scleral icterus.  NECK: Normal range of motion, supple, no masses noted on observation SKIN: No rash noted. Not diaphoretic. No erythema. No pallor. MUSCULOSKELETAL: Normal range of motion. No edema noted. NEUROLOGIC: Alert and oriented to person, place, and time. Normal muscle tone coordination.  PSYCHIATRIC: Normal mood and affect. Normal behavior. Normal judgment and thought content. RESPIRATORY: Effort normal, no problems with respiration noted PELVIC: Normal appearing external genitalia; normal appearing vaginal mucosa and cervix.  No abnormal discharge noted. IUD strings ~3cm in length visualized protruding from cervical os  Labs and Imaging No results found for this or any previous visit (from the past 168 hour(s)). No results found.    Assessment and Plan:   Problem List Items Addressed This Visit      Other  IUD (intrauterine device) in place - Primary    IUD strings unchanged from placement 6 weeks prior. Swab for trich TOC and BV/yeast per patient request. Discussed ongoing spotting is not abnormal, can persist for up to 6 months. Offered course of progesterone to see if this would help but prefers to wait at this time.        Other Visit Diagnoses    Trichomonas vaginalis (TV) infection       Relevant Orders   Cervicovaginal ancillary only( [place])      Routine  preventative health maintenance measures emphasized. Please refer to After Visit Summary for other counseling recommendations.   Return if symptoms worsen or fail to improve.    Total face-to-face time with patient: 15 minutes.  Over 50% of encounter was spent on counseling and coordination of care.   Venora Maples, MD/[facility] [employer], [facility]

## 2020-12-05 NOTE — Assessment & Plan Note (Signed)
IUD strings unchanged from placement 6 weeks prior. Swab for trich TOC and BV/yeast per patient request. Discussed ongoing spotting is not abnormal, can persist for up to 6 months. Offered course of progesterone to see if this would help but prefers to wait at this time.

## 2020-12-06 DIAGNOSIS — Z79899 Other long term (current) drug therapy: Secondary | ICD-10-CM | POA: Diagnosis not present

## 2020-12-06 DIAGNOSIS — J45909 Unspecified asthma, uncomplicated: Secondary | ICD-10-CM | POA: Diagnosis not present

## 2020-12-06 DIAGNOSIS — G4733 Obstructive sleep apnea (adult) (pediatric): Secondary | ICD-10-CM | POA: Diagnosis not present

## 2020-12-06 DIAGNOSIS — I272 Pulmonary hypertension, unspecified: Secondary | ICD-10-CM | POA: Diagnosis not present

## 2020-12-06 DIAGNOSIS — I5022 Chronic systolic (congestive) heart failure: Secondary | ICD-10-CM | POA: Diagnosis not present

## 2020-12-06 DIAGNOSIS — Z452 Encounter for adjustment and management of vascular access device: Secondary | ICD-10-CM | POA: Diagnosis not present

## 2020-12-06 DIAGNOSIS — I340 Nonrheumatic mitral (valve) insufficiency: Secondary | ICD-10-CM | POA: Diagnosis not present

## 2020-12-06 DIAGNOSIS — I5023 Acute on chronic systolic (congestive) heart failure: Secondary | ICD-10-CM | POA: Diagnosis not present

## 2020-12-06 DIAGNOSIS — Z7984 Long term (current) use of oral hypoglycemic drugs: Secondary | ICD-10-CM | POA: Diagnosis not present

## 2020-12-06 DIAGNOSIS — Z6841 Body Mass Index (BMI) 40.0 and over, adult: Secondary | ICD-10-CM | POA: Diagnosis not present

## 2020-12-06 DIAGNOSIS — E119 Type 2 diabetes mellitus without complications: Secondary | ICD-10-CM | POA: Diagnosis not present

## 2020-12-06 DIAGNOSIS — F17210 Nicotine dependence, cigarettes, uncomplicated: Secondary | ICD-10-CM | POA: Diagnosis not present

## 2020-12-06 DIAGNOSIS — O903 Peripartum cardiomyopathy: Secondary | ICD-10-CM | POA: Diagnosis not present

## 2020-12-06 LAB — CERVICOVAGINAL ANCILLARY ONLY
Bacterial Vaginitis (gardnerella): POSITIVE — AB
Candida Glabrata: NEGATIVE
Candida Vaginitis: NEGATIVE
Comment: NEGATIVE
Comment: NEGATIVE
Comment: NEGATIVE
Comment: NEGATIVE
Trichomonas: NEGATIVE

## 2020-12-06 MED ORDER — METRONIDAZOLE 500 MG PO TABS: 500.0000 mg | ORAL_TABLET | Freq: Two times a day (BID) | ORAL | 0 refills | Status: AC

## 2020-12-06 NOTE — Addendum Note (Signed)
Addended by: Merian Capron on: 12/06/2020 01:12 PM   Modules accepted: Orders

## 2020-12-11 NOTE — Progress Notes (Signed)
[facility] [employer] PREP Weekly Session   Patient Details  Name: Kirsten Wheeler MRN: [id number] Date of Birth: 03/20/1981 Age: 40 y.o. PCP: Marcine Matar, MD  Vitals:   12/11/20 1636  Weight: (!) 357 lb (161.9 kg)     [facility] [employer] Weekly seesion - 12/11/20 1600      Weekly Session   Topic Discussed Other   Portion control   Minutes exercised this week 60 minutes    Classes attended to date 9            Pam Jerral Bonito 12/11/2020, 4:37 PM

## 2020-12-13 ENCOUNTER — Encounter: Payer: Self-pay | Admitting: General Practice

## 2020-12-13 DIAGNOSIS — Z6841 Body Mass Index (BMI) 40.0 and over, adult: Secondary | ICD-10-CM | POA: Diagnosis not present

## 2020-12-13 DIAGNOSIS — J45909 Unspecified asthma, uncomplicated: Secondary | ICD-10-CM | POA: Diagnosis not present

## 2020-12-13 DIAGNOSIS — E119 Type 2 diabetes mellitus without complications: Secondary | ICD-10-CM | POA: Diagnosis not present

## 2020-12-13 DIAGNOSIS — F17210 Nicotine dependence, cigarettes, uncomplicated: Secondary | ICD-10-CM | POA: Diagnosis not present

## 2020-12-13 DIAGNOSIS — I5022 Chronic systolic (congestive) heart failure: Secondary | ICD-10-CM | POA: Diagnosis not present

## 2020-12-13 DIAGNOSIS — Z7984 Long term (current) use of oral hypoglycemic drugs: Secondary | ICD-10-CM | POA: Diagnosis not present

## 2020-12-13 DIAGNOSIS — O0903 Supervision of pregnancy with history of infertility, third trimester: Secondary | ICD-10-CM | POA: Diagnosis not present

## 2020-12-13 DIAGNOSIS — I5023 Acute on chronic systolic (congestive) heart failure: Secondary | ICD-10-CM | POA: Diagnosis not present

## 2020-12-13 DIAGNOSIS — I272 Pulmonary hypertension, unspecified: Secondary | ICD-10-CM | POA: Diagnosis not present

## 2020-12-13 DIAGNOSIS — I340 Nonrheumatic mitral (valve) insufficiency: Secondary | ICD-10-CM | POA: Diagnosis not present

## 2020-12-13 DIAGNOSIS — Z452 Encounter for adjustment and management of vascular access device: Secondary | ICD-10-CM | POA: Diagnosis not present

## 2020-12-13 DIAGNOSIS — O903 Peripartum cardiomyopathy: Secondary | ICD-10-CM | POA: Diagnosis not present

## 2020-12-13 DIAGNOSIS — G4733 Obstructive sleep apnea (adult) (pediatric): Secondary | ICD-10-CM | POA: Diagnosis not present

## 2020-12-13 DIAGNOSIS — Z79899 Other long term (current) drug therapy: Secondary | ICD-10-CM | POA: Diagnosis not present

## 2020-12-15 ENCOUNTER — Other Ambulatory Visit (HOSPITAL_COMMUNITY): Payer: Self-pay

## 2020-12-15 ENCOUNTER — Other Ambulatory Visit (HOSPITAL_COMMUNITY): Payer: Self-pay | Admitting: Cardiology

## 2020-12-15 NOTE — Progress Notes (Signed)
Paramedicine Encounter    Patient ID: Zaneta Lightcap, female    DOB: 07/01/1981, 40 y.o.   MRN: [id number]   Patient Care Team: Ladell Pier, MD as PCP - General (Internal Medicine) Constance Haw, MD as PCP - Electrophysiology (Cardiology) Larey Dresser, MD as PCP - Advanced Heart Failure (Cardiology)  Patient Active Problem List   Diagnosis Date Noted  . IUD (intrauterine device) in place 10/25/2020  . Influenza vaccine refused 08/24/2020  . Anxiety and depression 08/24/2020  . 23-polyvalent pneumococcal polysaccharide vaccine declined 08/24/2020  . Tobacco dependence 08/24/2020  . Acute on chronic systolic CHF (congestive heart failure) ([place]) 06/17/2019  . Obstructive sleep apnea 06/17/2019  . 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 Diabetes mellitus ([place]) 06/17/2019  . Amenorrhea 08/03/2018  . Congestive heart failure ([place]) 06/29/2018  . Hypotension 06/07/2018  . Rhinovirus infection 06/06/2018  . Asthma 06/05/2018  . Hypokalemia 06/05/2018  . Snoring 09/23/2016  . Cough 04/10/2016  . NICM (nonischemic cardiomyopathy) ([place]) 01/25/2016  . Tobacco abuse 01/25/2016  . Acute on chronic combined systolic and diastolic CHF (congestive heart failure) ([place]) 01/25/2016  . Mitral regurgitation   . Abnormal EKG-inf TWI 01/15/2016  . Positive D dimer-CTA neg for PE 01/15/2016  . Morbid obesity- BMI 57 01/14/2016  . Abdominal pain 01/14/2016  . 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 At risk for sleep apnea 01/14/2016  . Cardiomyopathy ([place]) 11/01/2015  . Morbid obesity with BMI of 50.0-59.9, adult ([place]) 08/16/2015    Current Outpatient Medications:  .  Accu-Chek Softclix Lancets lancets, Use as instructed, Disp: 100 each, Rfl: 12 .  atorvastatin (LIPITOR) 10 MG tablet, Take 1 tablet (10 mg total) by mouth daily., Disp: 90 tablet, Rfl: 3 .  Blood Glucose Monitoring Suppl (ACCU-CHEK GUIDE) w/Device KIT, Use as directed, Disp: 1 kit, Rfl:  0 .  Blood Glucose Monitoring Suppl (TRUE METRIX METER) w/Device KIT, Use as directed, Disp: 1 kit, Rfl: 0 .  carvedilol (COREG) 12.5 MG tablet, TAKE ONE TABLET BY MOUTH TWICE DAILY (AM+BEDTIME), Disp: 180 tablet, Rfl: 3 .  CORLANOR 7.5 MG TABS tablet, TAKE 1 TABLET (7.5 MG TOTAL) BY MOUTH 2 (TWO) TIMES DAILY WITH A MEAL (AM+BEDTIME), Disp: 60 tablet, Rfl: 3 .  dapagliflozin propanediol (FARXIGA) 10 MG TABS tablet, Take 10 mg by mouth in the morning., Disp: , Rfl:  .  digoxin (LANOXIN) 0.125 MG tablet, Take 1 tablet (0.125 mg total) by mouth daily., Disp: 90 tablet, Rfl: 3 .  Dulaglutide (TRULICITY) 1.5 WS/5.6CL SOPN, Inject 1.5 mg into the skin once a week., Disp: , Rfl:  .  ENTRESTO 49-51 MG, TAKE 1 TABLET BY MOUTH 2 (TWO) TIMES DAILY, Disp: 60 tablet, Rfl: 11 .  gabapentin (NEURONTIN) 300 MG capsule, Take 1 capsule (300 mg total) by mouth at bedtime., Disp: 30 capsule, Rfl: 3 .  glucose blood (ACCU-CHEK GUIDE) test strip, Check blood sugars daily before breakfast., Disp: 100 each, Rfl: 12 .  glucose blood (TRUE METRIX BLOOD GLUCOSE TEST) test strip, Use as instructed, Disp: 100 each, Rfl: 12 .  ipratropium-albuterol (DUONEB) 0.5-2.5 (3) MG/3ML SOLN, Take 3 mLs by nebulization every 4 (four) hours as needed., Disp: 360 mL, Rfl: 0 .  levonorgestrel (LILETTA, 52 MG,) 19.5 MCG/DAY IUD IUD, 1 each by Intrauterine route once., Disp: , Rfl:  .  Magnesium Oxide 400 (240 Mg) MG TABS, Take 2 tablets by mouth every morning and 1 tablet by mouth every evening, Disp: 90 tablet, Rfl: 6 .  metolazone (ZAROXOLYN)  2.5 MG tablet, As directed by HF clinic, Disp: 10 tablet, Rfl: 3 .  milrinone (PRIMACOR) 20 MG/100 ML SOLN infusion, Inject 0.0414 mg/min into the vein continuous., Disp:  , Rfl:  .  potassium chloride (KLOR-CON) 10 MEQ tablet, TAKE 8 TABS IN THE MORNING, 6 TABS IN THE AFTERNOON, AND 6 TABS IN THE EVENING, Disp: 600 tablet, Rfl: 11 .  PRESCRIPTION MEDICATION, CPAP- AT BEDTIME, Disp: , Rfl:  .   spironolactone (ALDACTONE) 25 MG tablet, TAKE ONE TABLET BY MOUTH ONCE DAILY (NOON), Disp: 90 tablet, Rfl: 3 .  torsemide (DEMADEX) 100 MG tablet, TAKE ONE TABLET BY MOUTH TWICE DAILY (AM+NOON), Disp: 60 tablet, Rfl: 11 Allergies  Allergen Reactions  . Metformin Diarrhea    And caused severe "dry mouth," also  . Metformin And Related Diarrhea    And caused severe "dry mouth," also      Social History   Socioeconomic History  . Marital status: Single    Spouse name: Not on file  . Number of children: 2  . Years of education: Not on file  . Highest education level: Some college, no degree  Occupational History  . Occupation: Disability  Tobacco Use  . Smoking status: Former Smoker    Types: Cigarettes    Quit date: 07/18/2016    Years since quitting: 4.4  . Smokeless tobacco: Never Used  . Tobacco comment: 3 cigarettes per day  Vaping Use  . Vaping Use: Never used  Substance and Sexual Activity  . Alcohol use: Not Currently    Alcohol/week: 0.0 standard drinks    Comment: occasionally  . Drug use: Not Currently    Types: Marijuana    Comment: only socially  . Sexual activity: Yes    Birth control/protection: None  Other Topics Concern  . Not on file  Social History Narrative  . Not on file   Social Determinants of Health   Financial Resource Strain: High Risk  . Difficulty of Paying Living Expenses: Hard  Food Insecurity: Not on file  Transportation Needs: Not on file  Physical Activity: Not on file  Stress: Not on file  Social Connections: Not on file  Intimate Partner Violence: Not on file    Physical Exam      Future Appointments  Date Time Provider [place]  12/29/2020  9:00 AM Larey Dresser, MD [facility] None  12/29/2020  8:00 PM Sueanne Margarita, MD [facility]  01/22/2021  8:35 AM [facility] DEVICE REMOTES [facility]  03/13/2021  1:30 PM Ladell Pier, MD [facility] None  04/23/2021  8:35 AM [facility] DEVICE REMOTES  [facility]  07/23/2021  8:35 AM [facility] DEVICE REMOTES [facility]  10/22/2021  8:35 AM [facility] DEVICE REMOTES [facility]  01/21/2022  8:35 AM [facility] DEVICE REMOTES [facility]    BP (!) 86/0   Pulse 90   Resp 20   SpO2 98%  Weight today-357 Weight yesterday-? Last visit weight-351  Pt reports she is hanging in there. She is enjoying her exercise class.  She is managing her meds through her bubble packs. She reports taking her meds. Still not taking her potassium as directed. She is only taking it BID not TID. She tries to but most of time she doesn't.  She got a phone call from her dad that he was in the hosp for c/p and sob so she is concerned.  She reports her breathing is doing pretty good.   Housing--she went for an interview for new  apt. But it was for interview to be on the wait list. She had to pay $1100 for some back pay rent to [place] but she had some paperwork stating that she didn't owe it, but that lady that managed it before told her she had to pay it to get the [facility] but now that lady is no longer at that [place] housing place and pt reports she got fired for [employer] but the new lady said she isnt responsible for that. So she is pursuing possible civil matter.  So that is a set back for her.  She is asking about the milrinone and how much longer she has to wear it and if she can survive without it.  She has clinic on 4/15 and I asked her to talk about that with him.    [name], [facility] Paramedic  12/15/20

## 2020-12-20 DIAGNOSIS — F17210 Nicotine dependence, cigarettes, uncomplicated: Secondary | ICD-10-CM | POA: Diagnosis not present

## 2020-12-20 DIAGNOSIS — I272 Pulmonary hypertension, unspecified: Secondary | ICD-10-CM | POA: Diagnosis not present

## 2020-12-20 DIAGNOSIS — Z452 Encounter for adjustment and management of vascular access device: Secondary | ICD-10-CM | POA: Diagnosis not present

## 2020-12-20 DIAGNOSIS — O0903 Supervision of pregnancy with history of infertility, third trimester: Secondary | ICD-10-CM | POA: Diagnosis not present

## 2020-12-20 DIAGNOSIS — I5023 Acute on chronic systolic (congestive) heart failure: Secondary | ICD-10-CM | POA: Diagnosis not present

## 2020-12-20 DIAGNOSIS — Z7984 Long term (current) use of oral hypoglycemic drugs: Secondary | ICD-10-CM | POA: Diagnosis not present

## 2020-12-20 DIAGNOSIS — Z6841 Body Mass Index (BMI) 40.0 and over, adult: Secondary | ICD-10-CM | POA: Diagnosis not present

## 2020-12-20 DIAGNOSIS — Z79899 Other long term (current) drug therapy: Secondary | ICD-10-CM | POA: Diagnosis not present

## 2020-12-20 DIAGNOSIS — Z736 Limitation of activities due to disability: Secondary | ICD-10-CM

## 2020-12-20 DIAGNOSIS — G4733 Obstructive sleep apnea (adult) (pediatric): Secondary | ICD-10-CM | POA: Diagnosis not present

## 2020-12-20 DIAGNOSIS — O903 Peripartum cardiomyopathy: Secondary | ICD-10-CM | POA: Diagnosis not present

## 2020-12-20 DIAGNOSIS — I340 Nonrheumatic mitral (valve) insufficiency: Secondary | ICD-10-CM | POA: Diagnosis not present

## 2020-12-20 DIAGNOSIS — J45909 Unspecified asthma, uncomplicated: Secondary | ICD-10-CM | POA: Diagnosis not present

## 2020-12-20 DIAGNOSIS — I5022 Chronic systolic (congestive) heart failure: Secondary | ICD-10-CM | POA: Diagnosis not present

## 2020-12-20 DIAGNOSIS — E119 Type 2 diabetes mellitus without complications: Secondary | ICD-10-CM | POA: Diagnosis not present

## 2020-12-25 NOTE — Progress Notes (Signed)
[facility] [employer] PREP Weekly Session   Patient Details  Name: Kirsten Wheeler MRN: [id number] Date of Birth: Dec 16, 1980 Age: 40 y.o. PCP: Marcine Matar, MD  Vitals:   12/25/20 1000  Weight: (!) 356 lb (161.5 kg)     [facility] Weekly seesion - 12/25/20 1100      Weekly Session   Topic Discussed Calorie breakdown    Minutes exercised this week 40 minutes    Classes attended to date 38            Lydie Stammen B Malyiah Fellows 12/25/2020, 11:44 AM

## 2020-12-27 DIAGNOSIS — I5023 Acute on chronic systolic (congestive) heart failure: Secondary | ICD-10-CM | POA: Diagnosis not present

## 2020-12-27 DIAGNOSIS — Z79899 Other long term (current) drug therapy: Secondary | ICD-10-CM | POA: Diagnosis not present

## 2020-12-27 DIAGNOSIS — E119 Type 2 diabetes mellitus without complications: Secondary | ICD-10-CM | POA: Diagnosis not present

## 2020-12-27 DIAGNOSIS — I272 Pulmonary hypertension, unspecified: Secondary | ICD-10-CM | POA: Diagnosis not present

## 2020-12-27 DIAGNOSIS — I340 Nonrheumatic mitral (valve) insufficiency: Secondary | ICD-10-CM | POA: Diagnosis not present

## 2020-12-27 DIAGNOSIS — G4733 Obstructive sleep apnea (adult) (pediatric): Secondary | ICD-10-CM | POA: Diagnosis not present

## 2020-12-27 DIAGNOSIS — O903 Peripartum cardiomyopathy: Secondary | ICD-10-CM | POA: Diagnosis not present

## 2020-12-27 DIAGNOSIS — Z6841 Body Mass Index (BMI) 40.0 and over, adult: Secondary | ICD-10-CM | POA: Diagnosis not present

## 2020-12-27 DIAGNOSIS — F17210 Nicotine dependence, cigarettes, uncomplicated: Secondary | ICD-10-CM | POA: Diagnosis not present

## 2020-12-27 DIAGNOSIS — Z452 Encounter for adjustment and management of vascular access device: Secondary | ICD-10-CM | POA: Diagnosis not present

## 2020-12-27 DIAGNOSIS — J45909 Unspecified asthma, uncomplicated: Secondary | ICD-10-CM | POA: Diagnosis not present

## 2020-12-27 DIAGNOSIS — Z7984 Long term (current) use of oral hypoglycemic drugs: Secondary | ICD-10-CM | POA: Diagnosis not present

## 2020-12-27 DIAGNOSIS — I5022 Chronic systolic (congestive) heart failure: Secondary | ICD-10-CM | POA: Diagnosis not present

## 2020-12-29 ENCOUNTER — Encounter (HOSPITAL_COMMUNITY): Payer: Medicaid Other | Admitting: Cardiology

## 2020-12-29 ENCOUNTER — Encounter (HOSPITAL_COMMUNITY): Payer: Self-pay | Admitting: Cardiology

## 2020-12-29 ENCOUNTER — Encounter (HOSPITAL_BASED_OUTPATIENT_CLINIC_OR_DEPARTMENT_OTHER): Payer: Medicaid Other | Admitting: Cardiology

## 2020-12-29 ENCOUNTER — Ambulatory Visit (HOSPITAL_COMMUNITY)
Admission: RE | Admit: 2020-12-29 | Discharge: 2020-12-29 | Disposition: A | Discharge status: Home / Self Care | Payer: Medicaid Other | Source: Ambulatory Visit | Attending: Cardiology | Admitting: Cardiology

## 2020-12-29 ENCOUNTER — Other Ambulatory Visit: Payer: Self-pay

## 2020-12-29 VITALS — BP 112/60 | HR 93 | Wt 358.6 lb

## 2020-12-29 DIAGNOSIS — E119 Type 2 diabetes mellitus without complications: Secondary | ICD-10-CM | POA: Diagnosis not present

## 2020-12-29 DIAGNOSIS — I471 Supraventricular tachycardia: Secondary | ICD-10-CM | POA: Diagnosis not present

## 2020-12-29 DIAGNOSIS — Z975 Presence of (intrauterine) contraceptive device: Secondary | ICD-10-CM | POA: Diagnosis not present

## 2020-12-29 DIAGNOSIS — Z6841 Body Mass Index (BMI) 40.0 and over, adult: Secondary | ICD-10-CM | POA: Diagnosis not present

## 2020-12-29 DIAGNOSIS — Z87891 Personal history of nicotine dependence: Secondary | ICD-10-CM | POA: Insufficient documentation

## 2020-12-29 DIAGNOSIS — I5042 Chronic combined systolic (congestive) and diastolic (congestive) heart failure: Secondary | ICD-10-CM

## 2020-12-29 DIAGNOSIS — Z79899 Other long term (current) drug therapy: Secondary | ICD-10-CM | POA: Diagnosis not present

## 2020-12-29 DIAGNOSIS — Z8249 Family history of ischemic heart disease and other diseases of the circulatory system: Secondary | ICD-10-CM | POA: Diagnosis not present

## 2020-12-29 DIAGNOSIS — E876 Hypokalemia: Secondary | ICD-10-CM | POA: Diagnosis not present

## 2020-12-29 DIAGNOSIS — Z7984 Long term (current) use of oral hypoglycemic drugs: Secondary | ICD-10-CM | POA: Insufficient documentation

## 2020-12-29 DIAGNOSIS — I428 Other cardiomyopathies: Secondary | ICD-10-CM | POA: Insufficient documentation

## 2020-12-29 DIAGNOSIS — I5022 Chronic systolic (congestive) heart failure: Secondary | ICD-10-CM | POA: Insufficient documentation

## 2020-12-29 DIAGNOSIS — G4733 Obstructive sleep apnea (adult) (pediatric): Secondary | ICD-10-CM | POA: Diagnosis not present

## 2020-12-29 LAB — BASIC METABOLIC PANEL
Anion gap: 8 (ref 5–15)
BUN: 9 mg/dL (ref 6–20)
CO2: 30 mmol/L (ref 22–32)
Calcium: 9.1 mg/dL (ref 8.9–10.3)
Chloride: 97 mmol/L — ABNORMAL LOW (ref 98–111)
Creatinine, Ser: 0.81 mg/dL (ref 0.44–1.00)
GFR, Estimated: >60 mL/min (ref >60)
Glucose, Bld: 274 mg/dL — ABNORMAL HIGH (ref 70–99)
Potassium: 3.3 mmol/L — ABNORMAL LOW (ref 3.5–5.1)
Sodium: 135 mmol/L (ref 135–145)

## 2020-12-29 LAB — DIGOXIN LEVEL: Digoxin Level: 0.2 ng/mL — ABNORMAL LOW (ref 0.8–2.0)

## 2020-12-29 MED ORDER — ENTRESTO 97-103 MG PO TABS: 1.0000 | ORAL_TABLET | Freq: Two times a day (BID) | ORAL | 6 refills | Status: DC

## 2020-12-29 NOTE — Progress Notes (Signed)
CSW met with pt during clinic visit to check in.  Pt reports she is doing well overall.  Still working towards getting into housing- is turning in additional requested documents then should be close to getting assigned an apartment.  Is excited for this opportunity as it will be $100 less per month than her current apartment.  Is hopeful she will be able to save up to get a house in the future.  Pt still really enjoying PREP program and feels like this has really helped her with working out more consistently- is planning to join the [facility] after the PREP program is over so she can continue working out.  Though she is working out more regularly still struggling to lose weight which she finds frustrating.  Pt admits to not staying with diet plans long term as she often tries extreme methods (like all liquid) and then weight bounces back.  Has downloaded an app to track food consumption and calories- CSW encouraged to track what she eats and enter in calorie calculator to find out how many calories in a day she should consume to meet her goals.  Pt will attempt to do this and will try to remain consistent on one plan to see if she can start to see results.  CSW will continue to follow and assist as needed  Jorge Ny, [facility] Desk#: [phone] Cell#: [phone]

## 2020-12-29 NOTE — Patient Instructions (Signed)
Increase Entresto to 97/103 mg Twice daily   Labs done today, your results will be available in MyChart, we will contact you for abnormal readings.  Your physician has requested that you have an echocardiogram. Echocardiography is a painless test that uses sound waves to create images of your heart. It provides your doctor with information about the size and shape of your heart and how well your heart's chambers and valves are working. This procedure takes approximately one hour. There are no restrictions for this procedure.  Your physician recommends that you schedule a follow-up appointment in: 6 weeks  If you have any questions or concerns before your next appointment please send [place] a message through [place] or call our office at [phone].    TO LEAVE A MESSAGE FOR THE NURSE SELECT OPTION 2, PLEASE LEAVE A MESSAGE INCLUDING: . YOUR NAME . DATE OF BIRTH . CALL BACK NUMBER . REASON FOR CALL**this is important as we prioritize the call backs  YOU WILL RECEIVE A CALL BACK THE SAME DAY AS LONG AS YOU CALL BEFORE 4:00 PM  At the [facility], you and your health needs are our priority. As part of our continuing mission to provide you with exceptional heart care, we have created designated Provider Care Teams. These Care Teams include your primary Cardiologist (physician) and Advanced Practice Providers (APPs- Physician Assistants and Nurse Practitioners) who all work together to provide you with the care you need, when you need it.   You may see any of the following providers on your designated Care Team at your next follow up: Marland Kitchen Dr Arvilla Meres . Dr Marca Ancona . Dr Thornell Mule . Tonye Becket, NP . Robbie Lis, PA . Shanda Bumps [place],NP . Karle Plumber, PharmD   Please be sure to bring in all your medications bottles to every appointment.

## 2020-12-31 NOTE — Progress Notes (Signed)
[facility] Note   Referring Physician:PCP: Ladell Pier, MD PCP-Cardiologist: Dr. Ronney Lion GYN : Dr Norman Herrlich.   HPI: 40 y.o. 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   She presented to the ER on 06/17/19 with several days of worsening HF with SOB at rest, marked edema and orthopnea/PND, not responding to increased doses of oral diuretics. She admitted to high levels of water and Gatorade intake. Her admit wt was 380 lb (Dry wt ~360 lb). She was started on IV Lasix but had initial poor urinary response. PO metolazone added as adjunct. Echo showed EF <20%, relatively normal RV, moderate MR. [place] 06/21/19 with low output (CI 1.8) and elevated PCWP but near normal RA pressure. She was started on inotropic therapy w/ milrinone for low output, 0.25 mcg/kg/min. Also treated w/ Entresto, Coreg, spironolactone, digoxin and ivabradine. She was later transitioned off of IV Lasix to po torsemide 80 mg qam/60 mg qpm.  Unfortunately, she was not a transplant candidate due to her morbid obesity. We discussed her at [facility] with Dr. Prescott Gum. She will need significant weight loss to get to LVAD. Will use home milrinone to try to facilitate increased activity to work towards weight loss. She understands that milrinone is not a good end-point and that we would like to use it as a bridge to eventual LVAD. RV function seems adequate for LVAD. Tunneled catheter was placed for home milrinone and home health was arranged. She was discharged home on 06/25/19 on milrinone 0.25 mcg/kg/min.   06/27/19 she presented to the [facility] w/ CC of palpitations. Was brought in by EMS and was reportedly in SVT with HR in the 180s. She was given adenosine and converted to NSR prior to arrival to the ED.  PICC exchanged 10/15/19  She returns today for followup of CHF. Weight is up 5 lbs.  She now has an IUD for contraception (had a terminated  pregnancy over the winter).  She is going to an exercise class on Monday and Wednesday (cardio, yoga).  No dyspnea with her ADLs.  No lightheadedness.  No orthopnea/PND.  Her milrinone was stopped for about 3 days accidentally recently.  She felt "terrible" with the milrinone off.    ICD interrogation (personally reviewed): Fluid index < threshold, no VT/VF.  ECG (personally reviewed): NSR, LVH, IVCD 132 msec  Labs (8/21): K 4.4, creatinine 0.9, hgb 14.4. Labs (06/13/20): K 4, creatinine 0.97  Labs (2/22): K 3.7, creatinine 0.76 Labs (3/22): K 4.4, creatinine 0.73, hgb 13.7  PMH:  1. Chronic systolic CHF: Nonischemic cardiomyopathy.  No family history of cardiomyopathy, no heavy ETOH, cocaine, or amphetamines.  Medtronic ICD.  - Echo (12/16) with EF 20-25%, moderate MR.  - Echo (5/17) with severely dilated LV, EF 10-15%, severe functional MR, mildly decreased RV systolic function. - LHC/RHC (5/17): No significant coronary disease; mean RA 15, PA 61/25 mean 41, mean PCWP 18, CI 1.7 (Fick), PVR 5.4 WU.  - Echo (10/17): EF 20%, severely dilated LV, normal RV size with mildly decreased systolic function, moderate-severe central MR.  - Echo 04/13/17  LVEF 15-20%, Severe LV dilation, Mod LAE, Mild RAE - CPX (5/19): RER 0.95, peak VO2 9.8, VE/VCO2 26 => submaximal but likely severe functional impairement.  - Echo (7/19): EF 20-25%, moderate LV dilation, RV normal size with mildly decreased systolic function, moderate central MR.  - Echo (10/20): EF < 20%, severe LV enlargement,  normal RV>  - RHC (10/20): mean RA 7, PA 65/25, mean PCWP 27, CI 1.8, PVR 3.87, PAPI 5.7.  2. Morbid obesity.  3. 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  Echo (10/17) with moderate-severe MR. Echo (7/19) with moderate central MR.  4. OSA: CPAP.  5. SVT  Current Outpatient Medications  Medication Sig Dispense Refill  . Accu-Chek Softclix Lancets lancets Use as instructed 100 each 12  . atorvastatin (LIPITOR)  10 MG tablet Take 1 tablet (10 mg total) by mouth daily. 90 tablet 3  . Blood Glucose Monitoring Suppl (ACCU-CHEK GUIDE) w/Device KIT Use as directed 1 kit 0  . Blood Glucose Monitoring Suppl (TRUE METRIX METER) w/Device KIT Use as directed 1 kit 0  . carvedilol (COREG) 12.5 MG tablet TAKE ONE TABLET BY MOUTH TWICE DAILY (AM+BEDTIME) 180 tablet 3  . CORLANOR 7.5 MG TABS tablet TAKE 1 TABLET (7.5 MG TOTAL) BY MOUTH 2 (TWO) TIMES DAILY WITH A MEAL (AM+BEDTIME) 60 tablet 3  . dapagliflozin propanediol (FARXIGA) 10 MG TABS tablet Take 10 mg by mouth in the morning.    . digoxin (LANOXIN) 0.125 MG tablet Take 1 tablet (0.125 mg total) by mouth daily. 90 tablet 3  . Dulaglutide (TRULICITY) 1.5 EL/3.8BO SOPN Inject 1.5 mg into the skin once a week.    Marland Kitchen glucose blood (ACCU-CHEK GUIDE) test strip Check blood sugars daily before breakfast. 100 each 12  . glucose blood (TRUE METRIX BLOOD GLUCOSE TEST) test strip Use as instructed 100 each 12  . ipratropium-albuterol (DUONEB) 0.5-2.5 (3) MG/3ML SOLN Take 3 mLs by nebulization every 4 (four) hours as needed. 360 mL 0  . levonorgestrel (LILETTA, 52 MG,) 19.5 MCG/DAY IUD IUD 1 each by Intrauterine route once.    . Magnesium Oxide 400 (240 Mg) MG TABS Take 2 tablets by mouth every morning and 1 tablet by mouth every evening 90 tablet 6  . metolazone (ZAROXOLYN) 2.5 MG tablet As directed by HF clinic 10 tablet 3  . milrinone (PRIMACOR) 20 MG/100 ML SOLN infusion Inject 0.0414 mg/min into the vein continuous.    . potassium chloride (KLOR-CON) 10 MEQ tablet TAKE 8 TABS IN THE MORNING, 6 TABS IN THE AFTERNOON, AND 6 TABS IN THE EVENING 600 tablet 11  . sacubitril-valsartan (ENTRESTO) 97-103 MG Take 1 tablet by mouth 2 (two) times daily. 60 tablet 6  . spironolactone (ALDACTONE) 25 MG tablet TAKE ONE TABLET BY MOUTH ONCE DAILY (NOON) 90 tablet 3  . torsemide (DEMADEX) 100 MG tablet TAKE ONE TABLET BY MOUTH TWICE DAILY (AM+NOON) 60 tablet 11   No current  facility-administered medications for this encounter.    Allergies  Allergen Reactions  . Metformin Diarrhea    And caused severe "dry mouth," also  . Metformin And Related Diarrhea    And caused severe "dry mouth," also      Social History   Socioeconomic History  . Marital status: Single    Spouse name: Not on file  . Number of children: 2  . Years of education: Not on file  . Highest education level: Some college, no degree  Occupational History  . Occupation: Disability  Tobacco Use  . Smoking status: Former Smoker    Types: Cigarettes    Quit date: 07/18/2016    Years since quitting: 4.4  . Smokeless tobacco: Never Used  . Tobacco comment: 3 cigarettes per day  Vaping Use  . Vaping Use: Never used  Substance and Sexual Activity  . Alcohol use: Not Currently  Alcohol/week: 0.0 standard drinks    Comment: occasionally  . Drug use: Not Currently    Types: Marijuana    Comment: only socially  . Sexual activity: Yes    Birth control/protection: None  Other Topics Concern  . Not on file  Social History Narrative  . Not on file   Social Determinants of Health   Financial Resource Strain: High Risk  . Difficulty of Paying Living Expenses: Hard  Food Insecurity: Not on file  Transportation Needs: Not on file  Physical Activity: Not on file  Stress: Not on file  Social Connections: Not on file  Intimate Partner Violence: Not on file     Family History  Problem Relation Age of Onset  . Heart attack Mother   . Asthma Mother   . Heart failure Mother   . Hypertension Father   . Diabetes Brother     Vitals:   12/29/20 1006  BP: 112/60  Pulse: 93  SpO2: 94%  Weight: (!) 162.7 kg (358 lb 9.6 oz)   Wt Readings from Last 3 Encounters:  12/29/20 (!) 162.7 kg (358 lb 9.6 oz)  12/25/20 (!) 161.5 kg (356 lb)  12/11/20 (!) 161.9 kg (357 lb)   Physical Exam: General: NAD, obese Neck: No JVD, no thyromegaly or thyroid nodule.  Lungs: Clear to auscultation  bilaterally with normal respiratory effort. CV: Nondisplaced PMI.  Heart regular S1/S2, no S3/S4, no murmur.  No peripheral edema.  No carotid bruit.  Normal pedal pulses.  Abdomen: Soft, nontender, no hepatosplenomegaly, no distention.  Skin: Intact without lesions or rashes.  Neurologic: Alert and oriented x 3.  Psych: Normal affect. Extremities: No clubbing or cyanosis.  HEENT: Normal.   ASSESSMENT & PLAN: 1. Chronic systolic CHF:  Nonischemic cardiomyopathy.  Body habitus precludes cardiac MRI.  Echo 06/2019 with EF < 20%, relatively normal RV, moderate MR.  [place] 06/21/19 with low output (CI 1.8) and elevated PCWP but near normal RA pressure, started on milrinone 0.25 with plans to continue home milrinone as bridge to possible LVAD if she could achieve significant weight loss.  Unfortunately, she has not succeeded in losing any weight and in fact, weight is up again today.  NYHA class II, she is not volume overloaded by exam or Optivol. .  - Continue torsemide 100 mg bid + metolazone once weekly.  BMET today. - Continue Coreg 12.5 mg bid.  - Increase Entresto to 97/103 bid, BMET 10 days.  - Continue ivabradine to 7.5 mg bid.  - Continue spironolactone 25 mg daily.  - Continue digoxin 0.125 mg. Check digoxin level.  - Continue Farxiga 10 mg daily.  - Repeat echo.  - She has been on milrinone for 1.5 years, really not making progress in weight loss so still not a candidate for transplant or LVAD.  She tolerated stopping milrinone poorly recently.  Will increase Entresto today, then at next appointment will try decreasing milrinone to 0.125 for slow titration off.  - Discussed that she needs to prevent pregnancy. She now has an IUD now. 2. Morbid obesity:  Body mass index is 59.67 kg/m. - Turned down for bariatric surgery due to insurance.  - Discussed portion control.  - Continue [employer] PREP class.  3. SVT: had ED visit 10/11 for SVT noted by EMS that converted back to NSR after dose of  adenosine. This was in the setting of hypokalemia and hypomagnesemia, at 3.0 and 1.5 respectively. Also in the setting of inotropic therapy w/ milrinone.  4. OSA: Will re-order sleep study. 5. DMII: On Farxiga + Trulicity.   Followup in 6 wks with APP.  Increase Entresto today, will try to decrease milrinone to 0.125 at followup appt.   Kirsten Champagne, MD 12/31/20

## 2021-01-01 ENCOUNTER — Encounter (HOSPITAL_BASED_OUTPATIENT_CLINIC_OR_DEPARTMENT_OTHER): Payer: Self-pay | Admitting: Emergency Medicine

## 2021-01-01 ENCOUNTER — Other Ambulatory Visit: Payer: Self-pay

## 2021-01-01 ENCOUNTER — Emergency Department (HOSPITAL_BASED_OUTPATIENT_CLINIC_OR_DEPARTMENT_OTHER)
Admission: EM | Admit: 2021-01-01 | Discharge: 2021-01-01 | Disposition: A | Discharge status: Home / Self Care | Payer: Medicaid Other | Attending: Emergency Medicine | Admitting: Emergency Medicine

## 2021-01-01 DIAGNOSIS — Z87891 Personal history of nicotine dependence: Secondary | ICD-10-CM | POA: Diagnosis not present

## 2021-01-01 DIAGNOSIS — E119 Type 2 diabetes mellitus without complications: Secondary | ICD-10-CM | POA: Diagnosis not present

## 2021-01-01 DIAGNOSIS — J45909 Unspecified asthma, uncomplicated: Secondary | ICD-10-CM | POA: Diagnosis not present

## 2021-01-01 DIAGNOSIS — E876 Hypokalemia: Secondary | ICD-10-CM | POA: Insufficient documentation

## 2021-01-01 DIAGNOSIS — I5023 Acute on chronic systolic (congestive) heart failure: Secondary | ICD-10-CM | POA: Diagnosis not present

## 2021-01-01 DIAGNOSIS — I959 Hypotension, unspecified: Secondary | ICD-10-CM | POA: Diagnosis present

## 2021-01-01 LAB — CBC WITH DIFFERENTIAL/PLATELET
Abs Immature Granulocytes: 0.04 10*3/uL (ref 0.00–0.07)
Basophils Absolute: 0 10*3/uL (ref 0.0–0.1)
Basophils Relative: 0 %
Eosinophils Absolute: 0.2 10*3/uL (ref 0.0–0.5)
Eosinophils Relative: 2 %
HCT: 42.6 % (ref 36.0–46.0)
Hemoglobin: 14.9 g/dL (ref 12.0–15.0)
Immature Granulocytes: 0 %
Lymphocytes Relative: 24 %
Lymphs Abs: 3 10*3/uL (ref 0.7–4.0)
MCH: 26.8 pg (ref 26.0–34.0)
MCHC: 35 g/dL (ref 30.0–36.0)
MCV: 76.8 fL — ABNORMAL LOW (ref 80.0–100.0)
Monocytes Absolute: 0.7 10*3/uL (ref 0.1–1.0)
Monocytes Relative: 6 %
Neutro Abs: 8.2 10*3/uL — ABNORMAL HIGH (ref 1.7–7.7)
Neutrophils Relative %: 68 %
Platelets: 315 10*3/uL (ref 150–400)
RBC: 5.55 MIL/uL — ABNORMAL HIGH (ref 3.87–5.11)
RDW: 14.5 % (ref 11.5–15.5)
WBC: 12.1 10*3/uL — ABNORMAL HIGH (ref 4.0–10.5)
nRBC: 0 % (ref 0.0–0.2)

## 2021-01-01 LAB — BASIC METABOLIC PANEL
Anion gap: 10 (ref 5–15)
BUN: 19 mg/dL (ref 6–20)
CO2: 32 mmol/L (ref 22–32)
Calcium: 9.8 mg/dL (ref 8.9–10.3)
Chloride: 93 mmol/L — ABNORMAL LOW (ref 98–111)
Creatinine, Ser: 1.03 mg/dL — ABNORMAL HIGH (ref 0.44–1.00)
GFR, Estimated: >60 mL/min (ref >60)
Glucose, Bld: 261 mg/dL — ABNORMAL HIGH (ref 70–99)
Potassium: 3.1 mmol/L — ABNORMAL LOW (ref 3.5–5.1)
Sodium: 135 mmol/L (ref 135–145)

## 2021-01-01 LAB — DIGOXIN LEVEL: Digoxin Level: 0.4 ng/mL — ABNORMAL LOW (ref 0.8–2.0)

## 2021-01-01 LAB — MAGNESIUM: Magnesium: 1.7 mg/dL (ref 1.7–2.4)

## 2021-01-01 MED ORDER — POTASSIUM CHLORIDE CRYS ER 20 MEQ PO TBCR
40.0000 meq | EXTENDED_RELEASE_TABLET | Freq: Once | ORAL | Status: AC
  Administered 2021-01-01: 40 meq via ORAL
  Filled 2021-01-01: qty 2

## 2021-01-01 NOTE — ED Triage Notes (Signed)
Pt from [facility].. pt stated that she felt a little dizzy and lightheaded with generalized weakness. . Pt's systolic BP was 90.

## 2021-01-01 NOTE — Progress Notes (Signed)
PREP Class education class; reports feeling lightheaded, requested vital sign evaluation; in office, BP 90/palp, HR 90, O2 sat 94-95, CBG 280; reports she hasn't taken any meds today, has not eaten, daughter had stomach virus last week; call to triage at Dr. Alford Highland office advised to be seen in ER or urgent care. [facility] transport arranged from [facility] to [facility] urgent care.

## 2021-01-01 NOTE — ED Provider Notes (Signed)
[place] EMERGENCY DEPT Provider Note   CSN: [id number] Arrival date & time: 01/01/21  1207     History Chief Complaint  Patient presents with  . Hypotension    Araly Kaas is a 40 y.o. female.  Pt presents to the ED today with hypotension.  Pt has a hx of chronic nonischemic CM and CHF with an EF of less than 20%.  She is followed in the CHF clinic by Dr. Kirk Ruths.  She was last seen on 4/14 and her Delene Loll was increased to 97/103 from 49/51.  However, she has not started any of those meds yet.  She is also on several other bp and CHF meds.  She did not take any of them yet today as she usually takes them after her class.  Pt was at her [facility] PREP class and her sbp was 90, so she was sent here for further eval.  She said she feels a little weak, but is otherwise ok.  She denies any f/c.  No n/v/d.         Past Medical History:  Diagnosis Date  . Asthma   . Chronic systolic CHF (congestive heart failure) ([place])    a. ECHO 01/15/2016 EF 10-15%. 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  . Diabetes mellitus ([place]) 06/17/2019  . Hearing loss    bilateral  . Mitral regurgitation    a. severe, felt to be functional 2/2 LV dilation   . Morbid obesity ([place])   . Snoring     Patient Active Problem List   Diagnosis Date Noted  . IUD (intrauterine device) in place 10/25/2020  . Influenza vaccine refused 08/24/2020  . Anxiety and depression 08/24/2020  . 23-polyvalent pneumococcal polysaccharide vaccine declined 08/24/2020  . Tobacco dependence 08/24/2020  . Acute on chronic systolic CHF (congestive heart failure) ([place]) 06/17/2019  . Obstructive sleep apnea 06/17/2019  . 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 Diabetes mellitus ([place]) 06/17/2019  . Amenorrhea 08/03/2018  . Congestive heart failure ([place]) 06/29/2018  . Hypotension 06/07/2018  . Rhinovirus infection 06/06/2018  . Asthma 06/05/2018  . Hypokalemia 06/05/2018  . Snoring  09/23/2016  . Cough 04/10/2016  . NICM (nonischemic cardiomyopathy) ([place]) 01/25/2016  . Tobacco abuse 01/25/2016  . Acute on chronic combined systolic and diastolic CHF (congestive heart failure) ([place]) 01/25/2016  . Mitral regurgitation   . Abnormal EKG-inf TWI 01/15/2016  . Positive D dimer-CTA neg for PE 01/15/2016  . Morbid obesity- BMI 57 01/14/2016  . Abdominal pain 01/14/2016  . 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 At risk for sleep apnea 01/14/2016  . Cardiomyopathy ([place]) 11/01/2015  . Morbid obesity with BMI of 50.0-59.9, adult ([place]) 08/16/2015    Past Surgical History:  Procedure Laterality Date  . CARDIAC CATHETERIZATION N/A 01/18/2016   Procedure: Right/Left Heart Cath and Coronary Angiography;  Surgeon: Larey Dresser, MD;  Location: [place] CV LAB;  Service: Cardiovascular;  Laterality: N/A;  . CESAREAN SECTION    . ICD IMPLANT N/A 04/17/2017   Procedure: ICD Implant;  Surgeon: Constance Haw, MD;  Location: [place] CV LAB;  Service: Cardiovascular;  Laterality: N/A;  . IR FLUORO GUIDE CV LINE RIGHT  06/23/2019  . IR FLUORO GUIDE CV LINE RIGHT  10/15/2019  . IR REMOVAL TUN CV CATH W/O FL  10/15/2019  . IR US GUIDE VASC ACCESS RIGHT  06/23/2019  . IR US GUIDE VASC ACCESS RIGHT  10/15/2019  . LEAD REVISION/REPAIR N/A 04/21/2017   Procedure:  Lead Revision/Repair;  Surgeon: Constance Haw, MD;  Location: [place] CV LAB;  Service: Cardiovascular;  Laterality: N/A;  . RIGHT HEART CATH N/A 06/21/2019   Procedure: RIGHT HEART CATH;  Surgeon: Larey Dresser, MD;  Location: [place] CV LAB;  Service: Cardiovascular;  Laterality: N/A;  . TONSILLECTOMY    . UNILATERAL SALPINGECTOMY  11/15/2007   unsure of which tube, ectopic pregnancy      OB History    Gravida  7   Para  2   Term  2   Preterm      AB  4   Living  2     SAB  3   IAB      Ectopic  1   Multiple      Live Births  2           Family History  Problem Relation Age of  Onset  . Heart attack Mother   . Asthma Mother   . Heart failure Mother   . Hypertension Father   . Diabetes Brother     Social History   Tobacco Use  . Smoking status: Former Smoker    Types: Cigarettes    Quit date: 07/18/2016    Years since quitting: 4.4  . Smokeless tobacco: Never Used  . Tobacco comment: 3 cigarettes per day  Vaping Use  . Vaping Use: Never used  Substance Use Topics  . Alcohol use: Not Currently    Alcohol/week: 0.0 standard drinks    Comment: occasionally  . Drug use: Not Currently    Types: Marijuana    Comment: only socially    Home Medications Prior to Admission medications   Medication Sig Start Date End Date Taking? Authorizing Provider  Accu-Chek Softclix Lancets lancets Use as instructed 11/13/20   Ladell Pier, MD  atorvastatin (LIPITOR) 10 MG tablet Take 1 tablet (10 mg total) by mouth daily. 01/25/20   Larey Dresser, MD  Blood Glucose Monitoring Suppl (ACCU-CHEK GUIDE) w/Device KIT Use as directed 11/13/20   Ladell Pier, MD  Blood Glucose Monitoring Suppl (TRUE METRIX METER) w/Device KIT Use as directed 11/13/20   Ladell Pier, MD  carvedilol (COREG) 12.5 MG tablet TAKE ONE TABLET BY MOUTH TWICE DAILY (AM+BEDTIME) 12/05/20   Larey Dresser, MD  CORLANOR 7.5 MG TABS tablet TAKE 1 TABLET (7.5 MG TOTAL) BY MOUTH 2 (TWO) TIMES DAILY WITH A MEAL (AM+BEDTIME) 09/21/20   Larey Dresser, MD  dapagliflozin propanediol (FARXIGA) 10 MG TABS tablet Take 10 mg by mouth in the morning.    [provider]  digoxin (LANOXIN) 0.125 MG tablet Take 1 tablet (0.125 mg total) by mouth daily. 01/25/20   Larey Dresser, MD  Dulaglutide (TRULICITY) 1.5 IZ/1.2WP SOPN Inject 1.5 mg into the skin once a week.    [provider]  glucose blood (ACCU-CHEK GUIDE) test strip Check blood sugars daily before breakfast. 11/13/20   Ladell Pier, MD  glucose blood (TRUE METRIX BLOOD GLUCOSE TEST) test strip Use as instructed 11/13/20    Ladell Pier, MD  ipratropium-albuterol (DUONEB) 0.5-2.5 (3) MG/3ML SOLN Take 3 mLs by nebulization every 4 (four) hours as needed. 06/08/18   Desiree Hane, MD  levonorgestrel (LILETTA, 52 MG,) 19.5 MCG/DAY IUD IUD 1 each by Intrauterine route once.    [provider]  Magnesium Oxide 400 (240 Mg) MG TABS Take 2 tablets by mouth every morning and 1 tablet by mouth every evening  08/01/20   Clegg, [name] D, NP  metolazone (ZAROXOLYN) 2.5 MG tablet As directed by HF clinic 07/06/20   Clegg, [name] D, NP  milrinone (PRIMACOR) 20 MG/100 ML SOLN infusion Inject 0.0414 mg/min into the vein continuous. 06/25/19   Lyda Jester M, PA-C  potassium chloride (KLOR-CON) 10 MEQ tablet TAKE 8 TABS IN THE MORNING, 6 TABS IN THE AFTERNOON, AND 6 TABS IN THE EVENING 12/05/20   Larey Dresser, MD  sacubitril-valsartan (ENTRESTO) 97-103 MG Take 1 tablet by mouth 2 (two) times daily. 12/29/20   Larey Dresser, MD  spironolactone (ALDACTONE) 25 MG tablet TAKE ONE TABLET BY MOUTH ONCE DAILY (NOON) 10/31/20   Lyda Jester M, PA-C  torsemide (DEMADEX) 100 MG tablet TAKE ONE TABLET BY MOUTH TWICE DAILY (AM+NOON) 12/05/20   Larey Dresser, MD    Allergies    Metformin and Metformin and related  Review of Systems   Review of Systems  Neurological: Positive for weakness.  All other systems reviewed and are negative.   Physical Exam Updated Vital Signs BP (!) 129/95   Pulse 88   Temp 97.9 F (36.6 C) (Oral)   Resp (!) 26   Ht _0  (1.626 m)   Wt (!) 162.4 kg   LMP  (Approximate)   SpO2 94%   BMI 61.45 kg/m   Physical Exam Vitals and nursing note reviewed.  Constitutional:      Appearance: Normal appearance. She is obese.  HENT:     Head: Normocephalic and atraumatic.     Right Ear: External ear normal.     Left Ear: External ear normal.     Nose: Nose normal.     Mouth/Throat:     Mouth: Mucous membranes are moist.     Pharynx: Oropharynx is clear.  Eyes:     Extraocular  Movements: Extraocular movements intact.     Conjunctiva/sclera: Conjunctivae normal.     Pupils: Pupils are equal, round, and reactive to light.  Cardiovascular:     Rate and Rhythm: Normal rate and regular rhythm.     Pulses: Normal pulses.     Heart sounds: Normal heart sounds.  Pulmonary:     Effort: Pulmonary effort is normal.     Breath sounds: Normal breath sounds.  Abdominal:     General: Abdomen is flat. Bowel sounds are normal.     Palpations: Abdomen is soft.  Musculoskeletal:        General: Normal range of motion.     Cervical back: Normal range of motion and neck supple.  Skin:    General: Skin is warm.     Capillary Refill: Capillary refill takes less than 2 seconds.  Neurological:     General: No focal deficit present.     Mental Status: She is alert and oriented to person, place, and time.  Psychiatric:        Mood and Affect: Mood normal.        Behavior: Behavior normal.        Thought Content: Thought content normal.        Judgment: Judgment normal.     ED Results / Procedures / Treatments   Labs (all labs ordered are listed, but only abnormal results are displayed) Labs Reviewed  DIGOXIN LEVEL - Abnormal; Notable for the following components:      Result Value   Digoxin Level 0.4 (*)    All other components within normal limits  BASIC METABOLIC PANEL - Abnormal; Notable for  the following components:   Potassium 3.1 (*)    Chloride 93 (*)    Glucose, Bld 261 (*)    Creatinine, Ser 1.03 (*)    All other components within normal limits  CBC WITH DIFFERENTIAL/PLATELET - Abnormal; Notable for the following components:   WBC 12.1 (*)    RBC 5.55 (*)    MCV 76.8 (*)    Neutro Abs 8.2 (*)    All other components within normal limits  MAGNESIUM  URINALYSIS, ROUTINE W REFLEX MICROSCOPIC    EKG None  Radiology No results found.  Procedures Procedures   Medications Ordered in ED Medications  potassium chloride SA (KLOR-CON) CR tablet 40 mEq  (40 mEq Oral Given 01/01/21 1335)    ED Course  I have reviewed the triage vital signs and the nursing notes.  Pertinent labs & imaging results that were available during my care of the patient were reviewed by me and considered in my medical decision making (see chart for details).    MDM Rules/Calculators/A&P                          BP has improved without intervention.  K is slightly low and is replaced.  Pt d/w Dr. Harrington Challenger (cards) who recommends pt to f/u with Dr. Aundra Dubin as scheduled.   She is stable for d/c.  Return if worse.  Final Clinical Impression(s) / ED Diagnoses Final diagnoses:  Hypokalemia    Rx / DC Orders ED Discharge Orders    None       Isla Pence, MD 01/01/21 1352

## 2021-01-01 NOTE — ED Notes (Signed)
Spoke to Dr. Particia Nearing about pt. Pt stated that she has CHF and lying supine causes issues with her breathing. Dr. Particia Nearing stated to just get do sitting, standing and standing for three minutes.

## 2021-01-01 NOTE — ED Notes (Signed)
Pt requested to take potassium with applesauce.

## 2021-01-02 ENCOUNTER — Other Ambulatory Visit (HOSPITAL_COMMUNITY): Payer: Self-pay | Admitting: Cardiology

## 2021-01-02 ENCOUNTER — Other Ambulatory Visit (HOSPITAL_COMMUNITY): Payer: Self-pay | Admitting: Adult Health

## 2021-01-03 ENCOUNTER — Telehealth: Payer: Self-pay

## 2021-01-03 DIAGNOSIS — Z452 Encounter for adjustment and management of vascular access device: Secondary | ICD-10-CM | POA: Diagnosis not present

## 2021-01-03 DIAGNOSIS — I5022 Chronic systolic (congestive) heart failure: Secondary | ICD-10-CM | POA: Diagnosis not present

## 2021-01-03 DIAGNOSIS — O903 Peripartum cardiomyopathy: Secondary | ICD-10-CM | POA: Diagnosis not present

## 2021-01-03 DIAGNOSIS — Z7984 Long term (current) use of oral hypoglycemic drugs: Secondary | ICD-10-CM | POA: Diagnosis not present

## 2021-01-03 DIAGNOSIS — E119 Type 2 diabetes mellitus without complications: Secondary | ICD-10-CM | POA: Diagnosis not present

## 2021-01-03 DIAGNOSIS — I340 Nonrheumatic mitral (valve) insufficiency: Secondary | ICD-10-CM | POA: Diagnosis not present

## 2021-01-03 DIAGNOSIS — Z79899 Other long term (current) drug therapy: Secondary | ICD-10-CM | POA: Diagnosis not present

## 2021-01-03 DIAGNOSIS — J45909 Unspecified asthma, uncomplicated: Secondary | ICD-10-CM | POA: Diagnosis not present

## 2021-01-03 DIAGNOSIS — Z6841 Body Mass Index (BMI) 40.0 and over, adult: Secondary | ICD-10-CM | POA: Diagnosis not present

## 2021-01-03 DIAGNOSIS — G4733 Obstructive sleep apnea (adult) (pediatric): Secondary | ICD-10-CM | POA: Diagnosis not present

## 2021-01-03 DIAGNOSIS — I5023 Acute on chronic systolic (congestive) heart failure: Secondary | ICD-10-CM | POA: Diagnosis not present

## 2021-01-03 DIAGNOSIS — F17210 Nicotine dependence, cigarettes, uncomplicated: Secondary | ICD-10-CM | POA: Diagnosis not present

## 2021-01-03 DIAGNOSIS — I272 Pulmonary hypertension, unspecified: Secondary | ICD-10-CM | POA: Diagnosis not present

## 2021-01-03 NOTE — Telephone Encounter (Signed)
Called to update on pt unable to exercise yesterday or today due to illness. Seen in ED yesterday after reporting not feeling well while at [facility]. Not managing BS (no monitor -BS fingerstick 280 4/19 at [facility])  and not eating well. Not taking meds due to them making her nauseous.  Asked if paramedicine could schedule a visit to f/u on blood sugar management and ensure that pt has medications to take as prescribed.  Will need to have pt eat prior to physical exercise as well as manage medications better so she can tolerate exercise. Will discuss with her next week.

## 2021-01-04 ENCOUNTER — Telehealth (HOSPITAL_COMMUNITY): Payer: Self-pay

## 2021-01-04 DIAGNOSIS — I5042 Chronic combined systolic (congestive) and diastolic (congestive) heart failure: Secondary | ICD-10-CM | POA: Diagnosis not present

## 2021-01-04 MED ORDER — 3 SERIES BP MONITOR/UPPER ARM DEVI: 0 refills | Status: AC

## 2021-01-04 NOTE — Addendum Note (Signed)
Encounter addended by: Noralee Space, RN on: 01/04/2021 4:53 PM  Actions taken: Visit diagnoses modified, Order list changed, Diagnosis association updated

## 2021-01-04 NOTE — Telephone Encounter (Signed)
I contacted pharmacy for update of glucometer order-he is using a manufacturer coupon b/c medicaid will not cover the meter but will cover the supplies. Meter cost is $7.50. I spoke to pt to update her and she is able to get the meter, she was actually out riding around and advised she would pick it up. I also informed her that clinic was sending over request for a bp cuff for her and to ask if it was ready as well.   Will f/u in a couple wks.   Kerry Hough, EMT-Paramedic  01/04/21

## 2021-01-08 NOTE — Progress Notes (Signed)
[facility] PREP Weekly Session   Patient Details  Name: Kirsten Wheeler MRN: [id number] Date of Birth: [birth year] Age: 40 y.o. PCP: Marcine Matar, MD  Vitals:   01/08/21 1132  Weight: (!) 358 lb (162.4 kg)     [facility] Weekly seesion - 01/08/21 1100      Weekly Session   Topic Discussed Other   How fit and strong you are survey completed   Minutes exercised this week --   not charted   Classes attended to date 47          Has been doing some meal prepping and making different food choices. Acknowledges she needs to get her health on track.  Able to walk on treadmill for 30 min at 1.5-2.0 mph encouraged to continue to do a few days per week Will reset goals next week.    Bonnye Fava 01/08/2021, 11:35 AM

## 2021-01-10 DIAGNOSIS — I272 Pulmonary hypertension, unspecified: Secondary | ICD-10-CM | POA: Diagnosis not present

## 2021-01-10 DIAGNOSIS — G4733 Obstructive sleep apnea (adult) (pediatric): Secondary | ICD-10-CM | POA: Diagnosis not present

## 2021-01-10 DIAGNOSIS — Z79899 Other long term (current) drug therapy: Secondary | ICD-10-CM | POA: Diagnosis not present

## 2021-01-10 DIAGNOSIS — Z452 Encounter for adjustment and management of vascular access device: Secondary | ICD-10-CM | POA: Diagnosis not present

## 2021-01-10 DIAGNOSIS — O903 Peripartum cardiomyopathy: Secondary | ICD-10-CM | POA: Diagnosis not present

## 2021-01-10 DIAGNOSIS — I340 Nonrheumatic mitral (valve) insufficiency: Secondary | ICD-10-CM | POA: Diagnosis not present

## 2021-01-10 DIAGNOSIS — J45909 Unspecified asthma, uncomplicated: Secondary | ICD-10-CM | POA: Diagnosis not present

## 2021-01-10 DIAGNOSIS — Z7984 Long term (current) use of oral hypoglycemic drugs: Secondary | ICD-10-CM | POA: Diagnosis not present

## 2021-01-10 DIAGNOSIS — E119 Type 2 diabetes mellitus without complications: Secondary | ICD-10-CM | POA: Diagnosis not present

## 2021-01-10 DIAGNOSIS — I5023 Acute on chronic systolic (congestive) heart failure: Secondary | ICD-10-CM | POA: Diagnosis not present

## 2021-01-10 DIAGNOSIS — I5022 Chronic systolic (congestive) heart failure: Secondary | ICD-10-CM | POA: Diagnosis not present

## 2021-01-10 DIAGNOSIS — Z6841 Body Mass Index (BMI) 40.0 and over, adult: Secondary | ICD-10-CM | POA: Diagnosis not present

## 2021-01-10 DIAGNOSIS — F17210 Nicotine dependence, cigarettes, uncomplicated: Secondary | ICD-10-CM | POA: Diagnosis not present

## 2021-01-12 ENCOUNTER — Other Ambulatory Visit (HOSPITAL_COMMUNITY): Payer: Self-pay | Admitting: Cardiology

## 2021-01-13 ENCOUNTER — Other Ambulatory Visit (HOSPITAL_COMMUNITY): Payer: Self-pay | Admitting: Cardiology

## 2021-01-16 NOTE — Progress Notes (Signed)
[facility] PREP Progress Report   Patient Details  Name: Kirsten Wheeler MRN: [id number] Date of Birth: March 26, 1981 Age: 40 y.o. PCP: Marcine Matar, MD  Vitals:   01/15/21 1045  SpO2: 97%  Weight: (!) 362 lb 12.8 oz (164.6 kg)      [facility] Eval - 01/16/21 1300      Measurement   Waist Circumference 62 inches    Hip Circumference 65 inches    Body fat --   E4     Information for Trainer   Current Exercise --   Consistency, eat healthy, make healthy lifestyle choices     Mobility and Daily Activities   I find it easy to walk up or down two or more flights of stairs. 3    I have no trouble taking out the trash. 1    I do housework such as vacuuming and dusting on my own without difficulty. 4    I can easily lift a gallon of milk (8lbs). 4    I can easily walk a mile. 4    I have no trouble reaching into high cupboards or reaching down to pick up something from the floor. 2    I do not have trouble doing out-door work such as Loss adjuster, chartered, raking leaves, or gardening. 2      Mobility and Daily Activities   I feel younger than my age. 4    I feel independent. 2    I feel energetic. 2    I live an active life.  3    I feel strong. 3    I feel healthy. 2    I feel active as other people my age. 3      How fit and strong are you.   Fit and Strong Total Score 39          Past Medical History:  Diagnosis Date  . Asthma   . Chronic systolic CHF (congestive heart failure) (HCC)    a. ECHO 01/15/2016 EF 10-15%. 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  . Diabetes mellitus (HCC) 06/17/2019  . Hearing loss    bilateral  . Mitral regurgitation    a. severe, felt to be functional 2/2 LV dilation   . Morbid obesity (HCC)   . Snoring    Past Surgical History:  Procedure Laterality Date  . CARDIAC CATHETERIZATION N/A 01/18/2016   Procedure: Right/Left Heart Cath and Coronary Angiography;  Surgeon: Laurey Morale, MD;  Location: [facility] INVASIVE CV LAB;  Service:  Cardiovascular;  Laterality: N/A;  . CESAREAN SECTION    . ICD IMPLANT N/A 04/17/2017   Procedure: ICD Implant;  Surgeon: Regan Lemming, MD;  Location: [facility] INVASIVE CV LAB;  Service: Cardiovascular;  Laterality: N/A;  . IR FLUORO GUIDE CV LINE RIGHT  06/23/2019  . IR FLUORO GUIDE CV LINE RIGHT  10/15/2019  . IR REMOVAL TUN CV CATH W/O FL  10/15/2019  . IR US GUIDE VASC ACCESS RIGHT  06/23/2019  . IR US GUIDE VASC ACCESS RIGHT  10/15/2019  . LEAD REVISION/REPAIR N/A 04/21/2017   Procedure: Lead Revision/Repair;  Surgeon: Regan Lemming, MD;  Location: [facility] INVASIVE CV LAB;  Service: Cardiovascular;  Laterality: N/A;  . RIGHT HEART CATH N/A 06/21/2019   Procedure: RIGHT HEART CATH;  Surgeon: Laurey Morale, MD;  Location: [facility] INVASIVE CV LAB;  Service: Cardiovascular;  Laterality: N/A;  . TONSILLECTOMY    . UNILATERAL SALPINGECTOMY  11/15/2007  unsure of which tube, ectopic pregnancy    Social History   Tobacco Use  Smoking Status Former Smoker  . Types: Cigarettes  . Quit date: 07/18/2016  . Years since quitting: 4.5  Smokeless Tobacco Never Used  Tobacco Comment   3 cigarettes per day       Sonia Baller 01/16/2021, 1:42 PM

## 2021-01-17 ENCOUNTER — Emergency Department (HOSPITAL_COMMUNITY)
Admission: EM | Admit: 2021-01-17 | Discharge: 2021-01-18 | Disposition: A | Discharge status: Home / Self Care | Payer: Medicaid Other | Source: Home / Self Care | Attending: Emergency Medicine | Admitting: Emergency Medicine

## 2021-01-17 ENCOUNTER — Telehealth: Payer: Self-pay | Admitting: Physician Assistant

## 2021-01-17 ENCOUNTER — Encounter (HOSPITAL_COMMUNITY): Payer: Self-pay

## 2021-01-17 ENCOUNTER — Emergency Department (HOSPITAL_COMMUNITY): Payer: Medicaid Other

## 2021-01-17 ENCOUNTER — Telehealth (HOSPITAL_COMMUNITY): Payer: Self-pay

## 2021-01-17 ENCOUNTER — Other Ambulatory Visit: Payer: Self-pay

## 2021-01-17 DIAGNOSIS — I272 Pulmonary hypertension, unspecified: Secondary | ICD-10-CM | POA: Diagnosis not present

## 2021-01-17 DIAGNOSIS — I5023 Acute on chronic systolic (congestive) heart failure: Secondary | ICD-10-CM | POA: Diagnosis not present

## 2021-01-17 DIAGNOSIS — T82594A Other mechanical complication of infusion catheter, initial encounter: Secondary | ICD-10-CM | POA: Diagnosis not present

## 2021-01-17 DIAGNOSIS — Z794 Long term (current) use of insulin: Secondary | ICD-10-CM | POA: Diagnosis not present

## 2021-01-17 DIAGNOSIS — J45909 Unspecified asthma, uncomplicated: Secondary | ICD-10-CM | POA: Diagnosis not present

## 2021-01-17 DIAGNOSIS — Z452 Encounter for adjustment and management of vascular access device: Secondary | ICD-10-CM | POA: Diagnosis not present

## 2021-01-17 DIAGNOSIS — Z79899 Other long term (current) drug therapy: Secondary | ICD-10-CM | POA: Diagnosis not present

## 2021-01-17 DIAGNOSIS — I5043 Acute on chronic combined systolic (congestive) and diastolic (congestive) heart failure: Secondary | ICD-10-CM | POA: Diagnosis not present

## 2021-01-17 DIAGNOSIS — Y848 Other medical procedures as the cause of abnormal reaction of the patient, or of later complication, without mention of misadventure at the time of the procedure: Secondary | ICD-10-CM | POA: Diagnosis not present

## 2021-01-17 DIAGNOSIS — Z5321 Procedure and treatment not carried out due to patient leaving prior to being seen by health care provider: Secondary | ICD-10-CM | POA: Insufficient documentation

## 2021-01-17 DIAGNOSIS — I5022 Chronic systolic (congestive) heart failure: Secondary | ICD-10-CM | POA: Diagnosis not present

## 2021-01-17 DIAGNOSIS — G4733 Obstructive sleep apnea (adult) (pediatric): Secondary | ICD-10-CM | POA: Diagnosis not present

## 2021-01-17 DIAGNOSIS — Z87891 Personal history of nicotine dependence: Secondary | ICD-10-CM | POA: Diagnosis not present

## 2021-01-17 DIAGNOSIS — E119 Type 2 diabetes mellitus without complications: Secondary | ICD-10-CM | POA: Diagnosis not present

## 2021-01-17 DIAGNOSIS — I340 Nonrheumatic mitral (valve) insufficiency: Secondary | ICD-10-CM | POA: Diagnosis not present

## 2021-01-17 DIAGNOSIS — T82524A Displacement of infusion catheter, initial encounter: Secondary | ICD-10-CM | POA: Diagnosis not present

## 2021-01-17 DIAGNOSIS — F17210 Nicotine dependence, cigarettes, uncomplicated: Secondary | ICD-10-CM | POA: Diagnosis not present

## 2021-01-17 DIAGNOSIS — Z95810 Presence of automatic (implantable) cardiac defibrillator: Secondary | ICD-10-CM | POA: Diagnosis not present

## 2021-01-17 DIAGNOSIS — T82898A Other specified complication of vascular prosthetic devices, implants and grafts, initial encounter: Secondary | ICD-10-CM | POA: Diagnosis not present

## 2021-01-17 DIAGNOSIS — E785 Hyperlipidemia, unspecified: Secondary | ICD-10-CM | POA: Diagnosis not present

## 2021-01-17 DIAGNOSIS — Z7984 Long term (current) use of oral hypoglycemic drugs: Secondary | ICD-10-CM | POA: Diagnosis not present

## 2021-01-17 DIAGNOSIS — O903 Peripartum cardiomyopathy: Secondary | ICD-10-CM | POA: Diagnosis not present

## 2021-01-17 DIAGNOSIS — Z6841 Body Mass Index (BMI) 40.0 and over, adult: Secondary | ICD-10-CM | POA: Diagnosis not present

## 2021-01-17 DIAGNOSIS — I517 Cardiomegaly: Secondary | ICD-10-CM | POA: Diagnosis not present

## 2021-01-17 DIAGNOSIS — Z20822 Contact with and (suspected) exposure to covid-19: Secondary | ICD-10-CM | POA: Diagnosis not present

## 2021-01-17 NOTE — Telephone Encounter (Signed)
Paged by [facility] infusion has patient's PICC has migrated. Recommended that patient needs to come to ER for PICC replacement. Per [facility], patient does not have transportation. Advised that patient can call EMS or family member can bring her to ER.   I have left voice mail to Burna Sis, LCSW to follow up with the patient in morning.

## 2021-01-17 NOTE — ED Triage Notes (Signed)
Feels like picc line came out a little bit. Per pt, "its usually 5in but today its 13in".

## 2021-01-17 NOTE — ED Provider Notes (Signed)
Emergency Medicine Provider Triage Evaluation Note  Naimah Yingst , a 40 y.o. female  was evaluated in triage.  Pt complains of picc line displacement. Picc line is for milrinone transfusion. Home nurse changed dressing today and said the picc line was 13 in today, usually is 5 in. Advised by cardiology to come to the ED.   Review of Systems  Positive: Picc line displacement  Negative:fever, chills, discharge, pus  Physical Exam  BP (!) 127/93 (BP Location: Left Arm)   Pulse 99   Temp 98.4 F (36.9 C) (Oral)   Resp 16   SpO2 97%  Gen:   Awake, no distress  Resp:  Normal effort MSK:   Moves extremities without difficulty  Other:  Dressing in place  Medical Decision Making  Medically screening exam initiated at 8:36 PM.  Appropriate orders placed.  [name] was informed that the remainder of the evaluation will be completed by another provider, this initial triage assessment does not replace that evaluation, and the importance of remaining in the ED until their evaluation is complete.  Picc line placement evaluation   Theron Arista, Cordelia Poche 01/17/21 2046    Cathren Laine, MD 01/17/21 2048

## 2021-01-17 NOTE — Telephone Encounter (Signed)
I called pt twice this week and no answer.   Kerry Hough, EMT-Paramedic  01/17/21

## 2021-01-18 ENCOUNTER — Telehealth (HOSPITAL_COMMUNITY): Payer: Self-pay | Admitting: Licensed Clinical Social Worker

## 2021-01-18 ENCOUNTER — Emergency Department (HOSPITAL_COMMUNITY): Payer: Medicaid Other

## 2021-01-18 ENCOUNTER — Other Ambulatory Visit: Payer: Self-pay

## 2021-01-18 ENCOUNTER — Observation Stay (HOSPITAL_COMMUNITY)
Admission: EM | Admit: 2021-01-18 | Discharge: 2021-01-19 | Disposition: A | Discharge status: Home / Self Care | Payer: Medicaid Other | Attending: Emergency Medicine | Admitting: Emergency Medicine

## 2021-01-18 DIAGNOSIS — E785 Hyperlipidemia, unspecified: Secondary | ICD-10-CM | POA: Diagnosis not present

## 2021-01-18 DIAGNOSIS — Z20822 Contact with and (suspected) exposure to covid-19: Secondary | ICD-10-CM | POA: Insufficient documentation

## 2021-01-18 DIAGNOSIS — Z7984 Long term (current) use of oral hypoglycemic drugs: Secondary | ICD-10-CM | POA: Insufficient documentation

## 2021-01-18 DIAGNOSIS — Z87891 Personal history of nicotine dependence: Secondary | ICD-10-CM | POA: Insufficient documentation

## 2021-01-18 DIAGNOSIS — I509 Heart failure, unspecified: Secondary | ICD-10-CM | POA: Diagnosis not present

## 2021-01-18 DIAGNOSIS — Z95810 Presence of automatic (implantable) cardiac defibrillator: Secondary | ICD-10-CM | POA: Insufficient documentation

## 2021-01-18 DIAGNOSIS — I5043 Acute on chronic combined systolic (congestive) and diastolic (congestive) heart failure: Secondary | ICD-10-CM | POA: Insufficient documentation

## 2021-01-18 DIAGNOSIS — Z794 Long term (current) use of insulin: Secondary | ICD-10-CM | POA: Insufficient documentation

## 2021-01-18 DIAGNOSIS — E119 Type 2 diabetes mellitus without complications: Secondary | ICD-10-CM | POA: Insufficient documentation

## 2021-01-18 DIAGNOSIS — T82898A Other specified complication of vascular prosthetic devices, implants and grafts, initial encounter: Secondary | ICD-10-CM | POA: Diagnosis not present

## 2021-01-18 DIAGNOSIS — J45909 Unspecified asthma, uncomplicated: Secondary | ICD-10-CM | POA: Insufficient documentation

## 2021-01-18 DIAGNOSIS — I517 Cardiomegaly: Secondary | ICD-10-CM | POA: Diagnosis not present

## 2021-01-18 DIAGNOSIS — T82524A Displacement of infusion catheter, initial encounter: Secondary | ICD-10-CM | POA: Diagnosis not present

## 2021-01-18 DIAGNOSIS — Z79899 Other long term (current) drug therapy: Secondary | ICD-10-CM | POA: Insufficient documentation

## 2021-01-18 DIAGNOSIS — Y848 Other medical procedures as the cause of abnormal reaction of the patient, or of later complication, without mention of misadventure at the time of the procedure: Secondary | ICD-10-CM | POA: Insufficient documentation

## 2021-01-18 DIAGNOSIS — T82594A Other mechanical complication of infusion catheter, initial encounter: Principal | ICD-10-CM | POA: Insufficient documentation

## 2021-01-18 DIAGNOSIS — T829XXA Unspecified complication of cardiac and vascular prosthetic device, implant and graft, initial encounter: Secondary | ICD-10-CM

## 2021-01-18 LAB — CBC WITH DIFFERENTIAL/PLATELET
Abs Immature Granulocytes: 0.02 10*3/uL (ref 0.00–0.07)
Basophils Absolute: 0 10*3/uL (ref 0.0–0.1)
Basophils Relative: 0 %
Eosinophils Absolute: 0.2 10*3/uL (ref 0.0–0.5)
Eosinophils Relative: 2 %
HCT: 40.1 % (ref 36.0–46.0)
Hemoglobin: 13.6 g/dL (ref 12.0–15.0)
Immature Granulocytes: 0 %
Lymphocytes Relative: 28 %
Lymphs Abs: 2.3 10*3/uL (ref 0.7–4.0)
MCH: 26.8 pg (ref 26.0–34.0)
MCHC: 33.9 g/dL (ref 30.0–36.0)
MCV: 78.9 fL — ABNORMAL LOW (ref 80.0–100.0)
Monocytes Absolute: 0.4 10*3/uL (ref 0.1–1.0)
Monocytes Relative: 5 %
Neutro Abs: 5.2 10*3/uL (ref 1.7–7.7)
Neutrophils Relative %: 65 %
Platelets: 289 10*3/uL (ref 150–400)
RBC: 5.08 MIL/uL (ref 3.87–5.11)
RDW: 14.2 % (ref 11.5–15.5)
WBC: 8 10*3/uL (ref 4.0–10.5)
nRBC: 0 % (ref 0.0–0.2)

## 2021-01-18 LAB — BASIC METABOLIC PANEL
Anion gap: 8 (ref 5–15)
BUN: 10 mg/dL (ref 6–20)
CO2: 30 mmol/L (ref 22–32)
Calcium: 9.3 mg/dL (ref 8.9–10.3)
Chloride: 98 mmol/L (ref 98–111)
Creatinine, Ser: 0.83 mg/dL (ref 0.44–1.00)
GFR, Estimated: >60 mL/min (ref >60)
Glucose, Bld: 244 mg/dL — ABNORMAL HIGH (ref 70–99)
Potassium: 3.3 mmol/L — ABNORMAL LOW (ref 3.5–5.1)
Sodium: 136 mmol/L (ref 135–145)

## 2021-01-18 LAB — RESP PANEL BY RT-PCR (FLU A&B, COVID) ARPGX2
Influenza A by PCR: NEGATIVE
Influenza B by PCR: NEGATIVE
SARS Coronavirus 2 by RT PCR: NEGATIVE

## 2021-01-18 MED ORDER — DAPAGLIFLOZIN PROPANEDIOL 10 MG PO TABS
10.0000 mg | ORAL_TABLET | Freq: Every morning | ORAL | Status: DC
  Administered 2021-01-19: 10 mg via ORAL
  Filled 2021-01-18 (×2): qty 1

## 2021-01-18 MED ORDER — DIGOXIN 125 MCG PO TABS
0.1250 mg | ORAL_TABLET | Freq: Every day | ORAL | Status: DC
  Administered 2021-01-19: 0.125 mg via ORAL
  Filled 2021-01-18: qty 1

## 2021-01-18 MED ORDER — IVABRADINE HCL 7.5 MG PO TABS
7.5000 mg | ORAL_TABLET | Freq: Two times a day (BID) | ORAL | Status: DC
  Administered 2021-01-18 – 2021-01-19 (×3): 7.5 mg via ORAL
  Filled 2021-01-18 (×5): qty 1

## 2021-01-18 MED ORDER — ACETAMINOPHEN 325 MG PO TABS
650.0000 mg | ORAL_TABLET | ORAL | Status: DC | PRN
  Administered 2021-01-19: 650 mg via ORAL
  Filled 2021-01-18: qty 2

## 2021-01-18 MED ORDER — POTASSIUM CHLORIDE CRYS ER 20 MEQ PO TBCR
60.0000 meq | EXTENDED_RELEASE_TABLET | Freq: Two times a day (BID) | ORAL | Status: DC
  Administered 2021-01-18 – 2021-01-19 (×2): 60 meq via ORAL
  Filled 2021-01-18: qty 6
  Filled 2021-01-18 (×2): qty 3

## 2021-01-18 MED ORDER — SODIUM CHLORIDE 0.9% FLUSH: 3.0000 mL | INTRAVENOUS | Status: DC | PRN

## 2021-01-18 MED ORDER — TORSEMIDE 100 MG PO TABS
100.0000 mg | ORAL_TABLET | Freq: Two times a day (BID) | ORAL | Status: DC
  Administered 2021-01-18 – 2021-01-19 (×2): 100 mg via ORAL
  Filled 2021-01-18 (×2): qty 1

## 2021-01-18 MED ORDER — MILRINONE LACTATE IN DEXTROSE 20-5 MG/100ML-% IV SOLN
0.2500 ug/kg/min | INTRAVENOUS | Status: DC
  Administered 2021-01-18 – 2021-01-19 (×2): 0.25 ug/kg/min via INTRAVENOUS
  Filled 2021-01-18 (×3): qty 100

## 2021-01-18 MED ORDER — SPIRONOLACTONE 25 MG PO TABS
25.0000 mg | ORAL_TABLET | Freq: Every day | ORAL | Status: DC
  Administered 2021-01-19: 25 mg via ORAL
  Filled 2021-01-18: qty 1

## 2021-01-18 MED ORDER — IPRATROPIUM-ALBUTEROL 0.5-2.5 (3) MG/3ML IN SOLN: 3.0000 mL | RESPIRATORY_TRACT | Status: DC | PRN

## 2021-01-18 MED ORDER — SODIUM CHLORIDE 0.9% FLUSH
3.0000 mL | Freq: Two times a day (BID) | INTRAVENOUS | Status: DC
  Administered 2021-01-18 – 2021-01-19 (×2): 3 mL via INTRAVENOUS

## 2021-01-18 MED ORDER — SODIUM CHLORIDE 0.9 % IV SOLN: 250.0000 mL | INTRAVENOUS | Status: DC | PRN

## 2021-01-18 MED ORDER — SACUBITRIL-VALSARTAN 97-103 MG PO TABS
1.0000 | ORAL_TABLET | Freq: Two times a day (BID) | ORAL | Status: DC
  Administered 2021-01-18 – 2021-01-19 (×2): 1 via ORAL
  Filled 2021-01-18 (×3): qty 1

## 2021-01-18 MED ORDER — MAGNESIUM OXIDE -MG SUPPLEMENT 400 (240 MG) MG PO TABS
400.0000 mg | ORAL_TABLET | Freq: Two times a day (BID) | ORAL | Status: DC
  Administered 2021-01-19: 400 mg via ORAL
  Filled 2021-01-18: qty 1

## 2021-01-18 MED ORDER — CARVEDILOL 12.5 MG PO TABS
12.5000 mg | ORAL_TABLET | Freq: Two times a day (BID) | ORAL | Status: DC
  Administered 2021-01-18 – 2021-01-19 (×3): 12.5 mg via ORAL
  Filled 2021-01-18 (×3): qty 1

## 2021-01-18 MED ORDER — ONDANSETRON HCL 4 MG/2ML IJ SOLN: 4.0000 mg | Freq: Four times a day (QID) | INTRAMUSCULAR | Status: DC | PRN

## 2021-01-18 MED ORDER — ATORVASTATIN CALCIUM 10 MG PO TABS
10.0000 mg | ORAL_TABLET | Freq: Every day | ORAL | Status: DC
  Administered 2021-01-19: 10 mg via ORAL
  Filled 2021-01-18: qty 1

## 2021-01-18 MED ORDER — MILRINONE LACTATE IN DEXTROSE 20-5 MG/100ML-% IV SOLN: 0.2500 ug/kg/min | INTRAVENOUS | Status: DC

## 2021-01-18 NOTE — ED Provider Notes (Signed)
Emergency Medicine Provider Triage Evaluation Note  Kirsten Wheeler , a 40 y.o. female  was evaluated in triage.  Pt complains of PICC line coming out  Review of Systems  Positive: picc line coming out Negative: No fever No cough  Physical Exam  BP 127/78 (BP Location: Right Arm)   Pulse 87   Temp 98.3 F (36.8 C) (Oral)   Resp (!) 26   SpO2 99%  Gen:   Awake, no distress   Resp:  Normal effort  MSK:   Moves extremities without difficulty  Other:  PICC line appears to be out further than it should be  Medical Decision Making  Medically screening exam initiated at 12:31 PM.  Appropriate orders placed.  [name] was informed that the remainder of the evaluation will be completed by another provider, this initial triage assessment does not replace that evaluation, and the importance of remaining in the ED until their evaluation is complete.     Osie Cheeks 01/18/21 1232    Benjiman Core, MD 01/18/21 [phone]

## 2021-01-18 NOTE — ED Triage Notes (Signed)
Pt reports PICC line through which she receives milrinone has dislodged. Sts that normally the measurement is 5" but today it's 13". Came for same last night but LWBS.

## 2021-01-18 NOTE — Telephone Encounter (Signed)
CSW received call from provider regarding pt needing to get PICC line replaced and concerns with not having transportation.  CSW followed up with pt first thing this morning to discuss.  She came to ED last night but the wait was too long so left before her PICC line was able to be replaced- is hoping not to have to go back to ED due to long wait times.  CSW discussed with clinic staff who called outpatient IR but they cannot fit pt in till Wednesday next week.  Pt informed that ED is only option to get PICC line replaced before that time- pt planning on going to ED this morning.  Will continue to follow and assist as needed  Burna Sis, LCSW Clinical Social Worker [facility] Desk#: [phone] Cell#: [phone]

## 2021-01-18 NOTE — H&P (Signed)
Cardiology Admission History and Physical:   [name] MRN: [id number]; DOB: [birth year]   Admission date: 01/18/2021  PCP:  Kirsten Pier, Wheeler   [facility] Providers Cardiologist:  None  Electrophysiologist:  Kirsten Meredith Leeds, Wheeler  Advanced Heart Failure:  Kirsten Champagne, Wheeler       Chief Complaint:  PICC line problems  Patient Profile:   Kirsten Wheeler is a 40 y.o. female with NICM, DM and HLD who is being seen 01/18/2021 for the evaluation of PICC line problems.  History of Present Illness:   Kirsten Wheeler noticed today that her PICC line seemed to be out farther than normal. Her home health nurse came today as routinely scheduled and stated that the PICC line had been pulled out and she needed to go to the ER. Kirsten Wheeler states that her milrinone has been infusing without difficulty and she feels well. She denies any chest pain, palpitations, lightheadedness, dizziness, orthopnea or PND. She has not tolerated weans of milrinone in the past.   Past Medical History:  Diagnosis Date  . Asthma   . Chronic systolic CHF (congestive heart failure) ([place])    a. ECHO 01/15/2016 EF 10-15%. 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  . Diabetes mellitus ([place]) 06/17/2019  . Hearing loss    bilateral  . Mitral regurgitation    a. severe, felt to be functional 2/2 LV dilation   . Morbid obesity ([place])   . Snoring     Past Surgical History:  Procedure Laterality Date  . CARDIAC CATHETERIZATION N/A 01/18/2016   Procedure: Right/Left Heart Cath and Coronary Angiography;  Surgeon: Kirsten Dresser, Wheeler;  Location: [place] CV LAB;  Service: Cardiovascular;  Laterality: N/A;  . CESAREAN SECTION    . ICD IMPLANT N/A 04/17/2017   Procedure: ICD Implant;  Surgeon: Kirsten Haw, Wheeler;  Location: [place] CV LAB;  Service: Cardiovascular;  Laterality: N/A;  . IR FLUORO GUIDE CV LINE RIGHT  06/23/2019  . IR FLUORO GUIDE CV LINE RIGHT  10/15/2019  . IR REMOVAL TUN CV CATH W/O  FL  10/15/2019  . IR US GUIDE VASC ACCESS RIGHT  06/23/2019  . IR US GUIDE VASC ACCESS RIGHT  10/15/2019  . LEAD REVISION/REPAIR N/A 04/21/2017   Procedure: Lead Revision/Repair;  Surgeon: Kirsten Haw, Wheeler;  Location: [place] CV LAB;  Service: Cardiovascular;  Laterality: N/A;  . RIGHT HEART CATH N/A 06/21/2019   Procedure: RIGHT HEART CATH;  Surgeon: Kirsten Dresser, Wheeler;  Location: [place] CV LAB;  Service: Cardiovascular;  Laterality: N/A;  . TONSILLECTOMY    . UNILATERAL SALPINGECTOMY  11/15/2007   unsure of which tube, ectopic pregnancy      Medications Prior to Admission: Prior to Admission medications   Medication Sig Start Date End Date Taking? Authorizing Provider  Accu-Chek Softclix Lancets lancets Use as instructed 11/13/20  Yes Kirsten Pier, Wheeler  atorvastatin (LIPITOR) 10 MG tablet TAKE 1 TABLET (10 MG TOTAL) BY MOUTH DAILY (NOON) 01/17/21  Yes Kirsten Dresser, Wheeler  Blood Glucose Monitoring Suppl (ACCU-CHEK GUIDE) w/Device KIT Use as directed 11/13/20  Yes Kirsten Pier, Wheeler  Blood Glucose Monitoring Suppl (TRUE METRIX METER) w/Device KIT Use as directed 11/13/20  Yes Kirsten Pier, Wheeler  Blood Pressure Monitoring (3 SERIES BP MONITOR/UPPER ARM) DEVI Check BP Daily 01/04/21  Yes Kirsten Dresser, Wheeler  carvedilol (COREG) 12.5 MG tablet TAKE ONE TABLET BY MOUTH TWICE DAILY (AM+BEDTIME) 12/05/20  Yes Kirsten Dresser, Wheeler  CORLANOR 7.5 MG TABS tablet TAKE 1 TABLET BY MOUTH 2 (TWO) TIMES DAILY WITH A MEAL (AM+BEDTIME) 01/17/21  Yes Kirsten Dresser, Wheeler  dapagliflozin propanediol (FARXIGA) 10 MG TABS tablet Take 10 mg by mouth in the morning.   Yes Provider, Historical, Wheeler  digoxin (LANOXIN) 0.125 MG tablet TAKE 1 TABLET (0.125 MG TOTAL) BY MOUTH DAILY (NOON) 01/02/21  Yes Kirsten Dresser, Wheeler  Dulaglutide (TRULICITY) 1.5 GQ/6.7YP SOPN Inject 1.5 mg into the skin once a week.   Yes Provider, Historical, Wheeler  glucose blood (ACCU-CHEK GUIDE) test strip Check blood sugars daily  before breakfast. 11/13/20  Yes Kirsten Pier, Wheeler  glucose blood (TRUE METRIX BLOOD GLUCOSE TEST) test strip Use as instructed 11/13/20  Yes Kirsten Pier, Wheeler  ipratropium-albuterol (DUONEB) 0.5-2.5 (3) MG/3ML SOLN Take 3 mLs by nebulization every 4 (four) hours as needed. 06/08/18  Yes Kirsten Wheeler  levonorgestrel (LILETTA, 52 MG,) 19.5 MCG/DAY IUD IUD 1 each by Intrauterine route once.   Yes Provider, Historical, Wheeler  Magnesium Oxide 400 (240 Mg) MG TABS Take 2 tablets by mouth every morning and 1 tablet by mouth every evening 08/01/20  Yes Kirsten Wheeler  metolazone (ZAROXOLYN) 2.5 MG tablet As directed by HF clinic 07/06/20  Yes Kirsten Wheeler  milrinone (PRIMACOR) 20 MG/100 ML SOLN infusion Inject 0.0414 mg/min into the vein continuous. 06/25/19  Yes Kirsten Wheeler  potassium chloride (KLOR-CON) 10 MEQ tablet TAKE 8 TABS IN THE MORNING, 6 TABS IN THE AFTERNOON, AND 6 TABS IN THE EVENING 12/05/20  Yes Kirsten Dresser, Wheeler  sacubitril-valsartan (ENTRESTO) 97-103 MG Take 1 tablet by mouth 2 (two) times daily. 12/29/20  Yes Kirsten Dresser, Wheeler  spironolactone (ALDACTONE) 25 MG tablet TAKE ONE TABLET BY MOUTH ONCE DAILY (NOON) 10/31/20  Yes Kirsten Wheeler  torsemide (DEMADEX) 100 MG tablet TAKE ONE TABLET BY MOUTH TWICE DAILY (AM+NOON) 12/05/20  Yes Kirsten Dresser, Wheeler     Allergies:    Allergies  Allergen Reactions  . Metformin Diarrhea    And caused severe "dry mouth," also  . Metformin And Related Diarrhea    And caused severe "dry mouth," also    Social History:   Social History   Socioeconomic History  . Marital status: Single    Spouse name: Not on file  . Number of children: 2  . Years of education: Not on file  . Highest education level: Some college, no degree  Occupational History  . Occupation: Disability  Tobacco Use  . Smoking status: Former Smoker    Types: Cigarettes    Quit date: 07/18/2016    Years since quitting: 4.5  .  Smokeless tobacco: Never Used  . Tobacco comment: 3 cigarettes per day  Vaping Use  . Vaping Use: Never used  Substance and Sexual Activity  . Alcohol use: Not Currently    Alcohol/week: 0.0 standard drinks    Comment: occasionally  . Drug use: Not Currently    Types: Marijuana    Comment: only socially  . Sexual activity: Yes    Birth control/protection: None  Other Topics Concern  . Not on file  Social History Narrative  . Not on file   Social Determinants of Health   Financial Resource Strain: High Risk  . Difficulty of Paying Living Expenses: Hard  Food Insecurity: Not on file  Transportation Needs: Not on file  Physical Activity: Not  on file  Stress: Not on file  Social Connections: Not on file  Intimate Partner Violence: Not on file    Family History:   The patient's family history includes Asthma in her mother; Diabetes in her brother; Heart attack in her mother; Heart failure in her mother; Hypertension in her father.    ROS:  Please see the history of present illness.  All other ROS reviewed and negative.     Physical Exam/Data:   Vitals:   01/18/21 1900 01/18/21 1945 01/18/21 2015 01/18/21 2115  BP: 108/77 100/67 113/78 111/78  Pulse: 79 83 78 79  Resp: 16  (!) 24 (!) 24  Temp:      TempSrc:      SpO2: 98% 97% 99% 98%  Weight:      Height:       No intake or output data in the 24 hours ending 01/18/21 2129 Last 3 Weights 01/18/2021 01/17/2021 01/15/2021  Weight (lbs) 361 lb 8.9 oz 362 lb 362 lb 12.8 oz  Weight (kg) 164 kg 164.202 kg 164.565 kg     Body mass index is 62.06 kg/m.  General:  Well nourished, well developed, in no acute distress, obses HEENT: normal Neck: JVP 3cm above the clavicle when lying at 60 degrees Cardiac:  normal S1, S2; RRR; I/VI systolic murmur heard throughout Lungs:  clear to auscultation bilaterally, no wheezing, rhonchi or rales  Abd: soft, nontender, no hepatomegaly  Ext: no edema Musculoskeletal:  No deformities, BUE and  BLE strength normal and equal Skin: warm and dry  Neuro:  CNs 2-12 intact, no focal abnormalities noted Psych:  Normal affect    EKG: Not done this admission  Relevant CV Studies: [facility] 06/21/2019 1. PCWP remains elevated though RA pressure now decreased to near normal.  2. Low cardiac output, CI 1.8.  3. Moderate primarily pulmonary venous hypertension.    Laboratory Data:  High Sensitivity Troponin:  No results for input(s): TROPONINIHS in the last 720 hours.    Chemistry Recent Labs  Lab 01/18/21 1904  NA 136  K 3.3*  CL 98  CO2 30  GLUCOSE 244*  BUN 10  CREATININE 0.83  CALCIUM 9.3  GFRNONAA >60  ANIONGAP 8    No results for input(s): PROT, ALBUMIN, AST, ALT, ALKPHOS, BILITOT in the last 168 hours. Hematology Recent Labs  Lab 01/18/21 1904  WBC 8.0  RBC 5.08  HGB 13.6  HCT 40.1  MCV 78.9*  MCH 26.8  MCHC 33.9  RDW 14.2  PLT 289   BNPNo results for input(s): BNP, PROBNP in the last 168 hours.  DDimer No results for input(s): DDIMER in the last 168 hours.   Radiology/Studies:  DG Chest 2 View  Result Date: 01/18/2021 CLINICAL DATA:  Possible PICC malpositioning. EXAM: CHEST - 2 VIEW COMPARISON:  Single-view of the chest 01/18/2019. FINDINGS: Right PICC is again seen and unchanged since yesterday's study with its tip projecting at the confluence of the right subclavian and right internal jugular veins. The catheter is looped over the upper right chest. AICD again seen. Cardiomegaly without edema. Lungs clear. No pneumothorax or pleural fluid. IMPRESSION: The patient's right PICC remains looped over the upper right chest with its tip projecting at the confluence of the right subclavian and right internal jugular veins. Cardiomegaly. Electronically Signed   By: Inge Rise M.D.   On: 01/18/2021 13:12     Assessment and Plan:   1. PICC line pulled- She presents with her PICC line out  farther than normal. Her milrinone seems to be infusing well. IR aware.  Kirsten plan to exchange PICC tomorrow. - NPO at [facility] for PICC exchange  2. HFrEF- Clinically doing well. Mildly elevated JVP on exam but patient has not taken her torsemide today - Torsemide 177m BID - Continue home milrinone 0.25 - continue home entresto 97-1071mBID, coreg 12.85m24mID, ivabradine 7.85mg46mD, spiro 285mg385md farxiga 10mg 59mntinue home digoxin 0.125 - Check digoxin level in AM  3. Hyperlipidemia - Continue home lipitor  4. Diabetes - Continue home farxiga - Patient took her Trulicity on Monday   Risk Assessment/Risk Scores:    Severity of Illness: The appropriate patient status for this patient is OBSERVATION. Observation status is judged to be reasonable and necessary in order to provide the required intensity of service to ensure the patient's safety. The patient's presenting symptoms, physical exam findings, and initial radiographic and laboratory data in the context of their medical condition is felt to place them at decreased risk for further clinical deterioration. Furthermore, it is anticipated that the patient Kirsten be medically stable for discharge from the hospital within 2 midnights of admission. The following factors support the patient status of observation.   " The patient's presenting symptoms include PICC line pulled. " The physical exam findings include PICC line pulled. " The initial radiographic and laboratory data are none.     For questions or updates, please contact [facility] consult [email or web address] for contact info under     Signed, ConiglPrincella Pellegrini5/01/2021 9:29 PM

## 2021-01-18 NOTE — ED Notes (Signed)
Pt did not respond when called for vitals check X3  

## 2021-01-18 NOTE — ED Provider Notes (Signed)
[facility] EMERGENCY DEPARTMENT Provider Note   CSN: [id number] Arrival date & time: 01/18/21  1218     History Chief Complaint  Patient presents with  . Vascular Access Problem    Kirsten Wheeler is a 40 y.o. female with history of severe CHF on continuous milrinone infusion, presenting for evaluation of her right PICC line.  She has PICC line to the right chest.  Yesterday she woke up and think she slept on it wrong, the PICC line pulled out some from her skin.  Her home health nurse coiled the extra tubing and taped it to secure it.  PICC line is usually at 5 inches, it is now at 13 inches.  She presented to the ED yesterday, however left after triage due to long wait time. She states her medications have been infusing properly.  She is otherwise asymptomatic.   The history is provided by the patient.       Past Medical History:  Diagnosis Date  . Asthma   . Chronic systolic CHF (congestive heart failure) ([place])    a. ECHO 01/15/2016 EF 10-15%. 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  . Diabetes mellitus ([place]) 06/17/2019  . Hearing loss    bilateral  . Mitral regurgitation    a. severe, felt to be functional 2/2 LV dilation   . Morbid obesity ([place])   . Snoring     Patient Active Problem List   Diagnosis Date Noted  . IUD (intrauterine device) in place 10/25/2020  . Influenza vaccine refused 08/24/2020  . Anxiety and depression 08/24/2020  . 23-polyvalent pneumococcal polysaccharide vaccine declined 08/24/2020  . Tobacco dependence 08/24/2020  . Acute on chronic systolic CHF (congestive heart failure) ([place]) 06/17/2019  . Obstructive sleep apnea 06/17/2019  . 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 Diabetes mellitus ([place]) 06/17/2019  . Amenorrhea 08/03/2018  . Congestive heart failure ([place]) 06/29/2018  . Hypotension 06/07/2018  . Rhinovirus infection 06/06/2018  . Asthma 06/05/2018  . Hypokalemia 06/05/2018  . Snoring  09/23/2016  . Cough 04/10/2016  . NICM (nonischemic cardiomyopathy) ([place]) 01/25/2016  . Tobacco abuse 01/25/2016  . Acute on chronic combined systolic and diastolic CHF (congestive heart failure) ([place]) 01/25/2016  . Mitral regurgitation   . Abnormal EKG-inf TWI 01/15/2016  . Positive D dimer-CTA neg for PE 01/15/2016  . Morbid obesity- BMI 57 01/14/2016  . Abdominal pain 01/14/2016  . 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 At risk for sleep apnea 01/14/2016  . Cardiomyopathy ([place]) 11/01/2015  . Morbid obesity with BMI of 50.0-59.9, adult ([place]) 08/16/2015    Past Surgical History:  Procedure Laterality Date  . CARDIAC CATHETERIZATION N/A 01/18/2016   Procedure: Right/Left Heart Cath and Coronary Angiography;  Surgeon: Larey Dresser, MD;  Location: [place] [facility];  Service: Cardiovascular;  Laterality: N/A;  . CESAREAN SECTION    . ICD IMPLANT N/A 04/17/2017   Procedure: ICD Implant;  Surgeon: Constance Haw, MD;  Location: [place] [facility];  Service: Cardiovascular;  Laterality: N/A;  . IR FLUORO GUIDE CV LINE RIGHT  06/23/2019  . IR FLUORO GUIDE CV LINE RIGHT  10/15/2019  . IR REMOVAL TUN CV CATH W/O FL  10/15/2019  . IR US GUIDE VASC ACCESS RIGHT  06/23/2019  . IR US GUIDE VASC ACCESS RIGHT  10/15/2019  . LEAD REVISION/REPAIR N/A 04/21/2017   Procedure: Lead Revision/Repair;  Surgeon: Constance Haw, MD;  Location: [place] [facility];  Service: Cardiovascular;  Laterality:  N/A;  . RIGHT HEART CATH N/A 06/21/2019   Procedure: RIGHT HEART CATH;  Surgeon: Larey Dresser, MD;  Location: [place] [facility];  Service: Cardiovascular;  Laterality: N/A;  . TONSILLECTOMY    . UNILATERAL SALPINGECTOMY  11/15/2007   unsure of which tube, ectopic pregnancy      OB History    Gravida  7   Para  2   Term  2   Preterm      AB  4   Living  2     SAB  3   IAB      Ectopic  1   Multiple      Live Births  2           Family History  Problem Relation Age of  Onset  . Heart attack Mother   . Asthma Mother   . Heart failure Mother   . Hypertension Father   . Diabetes Brother     Social History   Tobacco Use  . Smoking status: Former Smoker    Types: Cigarettes    Quit date: 07/18/2016    Years since quitting: 4.5  . Smokeless tobacco: Never Used  . Tobacco comment: 3 cigarettes per day  Vaping Use  . Vaping Use: Never used  Substance Use Topics  . Alcohol use: Not Currently    Alcohol/week: 0.0 standard drinks    Comment: occasionally  . Drug use: Not Currently    Types: Marijuana    Comment: only socially    Home Medications Prior to Admission medications   Medication Sig Start Date End Date Taking? Authorizing Provider  Accu-Chek Softclix Lancets lancets Use as instructed 11/13/20   Ladell Pier, MD  atorvastatin (LIPITOR) 10 MG tablet TAKE 1 TABLET (10 MG TOTAL) BY MOUTH DAILY (NOON) 01/17/21   Larey Dresser, MD  Blood Glucose Monitoring Suppl (ACCU-CHEK GUIDE) w/Device KIT Use as directed 11/13/20   Ladell Pier, MD  Blood Glucose Monitoring Suppl (TRUE METRIX METER) w/Device KIT Use as directed 11/13/20   Ladell Pier, MD  Blood Pressure Monitoring (3 SERIES BP MONITOR/UPPER ARM) DEVI Check BP Daily 01/04/21   Larey Dresser, MD  carvedilol (COREG) 12.5 MG tablet TAKE ONE TABLET BY MOUTH TWICE DAILY (AM+BEDTIME) 12/05/20   Larey Dresser, MD  CORLANOR 7.5 MG TABS tablet TAKE 1 TABLET BY MOUTH 2 (TWO) TIMES DAILY WITH A MEAL (AM+BEDTIME) 01/17/21   Larey Dresser, MD  dapagliflozin propanediol (FARXIGA) 10 MG TABS tablet Take 10 mg by mouth in the morning.    [provider]  digoxin (LANOXIN) 0.125 MG tablet TAKE 1 TABLET (0.125 MG TOTAL) BY MOUTH DAILY (NOON) 01/02/21   Larey Dresser, MD  Dulaglutide (TRULICITY) 1.5 NV/9.38YO SOPN Inject 1.5 mg into the skin once a week.    [provider]  glucose blood (ACCU-CHEK GUIDE) test strip Check blood sugars daily before breakfast. 11/13/20    Ladell Pier, MD  glucose blood (TRUE METRIX BLOOD GLUCOSE TEST) test strip Use as instructed 11/13/20   Ladell Pier, MD  ipratropium-albuterol (DUONEB) 0.5-2.5 (3) MG/3ML SOLN Take 3 mLs by nebulization every 4 (four) hours as needed. 06/08/18   Desiree Hane, MD  levonorgestrel (LILETTA, 52 MG,) 19.5 MCG/DAY IUD IUD 1 each by Intrauterine route once.    [provider]  Magnesium Oxide 400 (240 Mg) MG TABS Take 2 tablets by mouth every morning and 1 tablet by mouth every evening  08/01/20   Clegg, [name] D, NP  metolazone (ZAROXOLYN) 2.5 MG tablet As directed by HF clinic 07/06/20   Clegg, [name] D, NP  milrinone (PRIMACOR) 20 MG/100 ML SOLN infusion Inject 0.0414 mg/min into the vein continuous. 06/25/19   Lyda Jester M, PA-C  potassium chloride (KLOR-CON) 10 MEQ tablet TAKE 8 TABS IN THE MORNING, 6 TABS IN THE AFTERNOON, AND 6 TABS IN THE EVENING 12/05/20   Larey Dresser, MD  sacubitril-valsartan (ENTRESTO) 97-103 MG Take 1 tablet by mouth 2 (two) times daily. 12/29/20   Larey Dresser, MD  spironolactone (ALDACTONE) 25 MG tablet TAKE ONE TABLET BY MOUTH ONCE DAILY (NOON) 10/31/20   Lyda Jester M, PA-C  torsemide (DEMADEX) 100 MG tablet TAKE ONE TABLET BY MOUTH TWICE DAILY (AM+NOON) 12/05/20   Larey Dresser, MD    Allergies    Metformin and Metformin and related  Review of Systems   Review of Systems  All other systems reviewed and are negative.   Physical Exam Updated Vital Signs BP 113/78   Pulse 78   Temp 98.3 F (36.8 C) (Oral)   Resp (!) 24   Ht 5' 4"  (1.626 m)   Wt (!) 164 kg   SpO2 99%   BMI 62.06 kg/m   Physical Exam Vitals and nursing note reviewed.  Constitutional:      General: She is not in acute distress.    Appearance: She is well-developed. She is obese.  HENT:     Head: Normocephalic and atraumatic.  Eyes:     Conjunctiva/sclera: Conjunctivae normal.  Cardiovascular:     Rate and Rhythm: Normal rate and regular rhythm.      Comments: Right chest with PICC present.  X-ray PICC tubing is coiled and taped down with Tegaderm.  No surrounding inflammatory changes Pulmonary:     Effort: Pulmonary effort is normal.     Breath sounds: Normal breath sounds.  Abdominal:     Palpations: Abdomen is soft.  Skin:    General: Skin is warm.  Neurological:     Mental Status: She is alert.  Psychiatric:        Behavior: Behavior normal.     ED Results / Procedures / Treatments   Labs (all labs ordered are listed, but only abnormal results are displayed) Labs Reviewed  BASIC METABOLIC PANEL - Abnormal; Notable for the following components:      Result Value   Potassium 3.3 (*)    Glucose, Bld 244 (*)    All other components within normal limits  CBC WITH DIFFERENTIAL/PLATELET - Abnormal; Notable for the following components:   MCV 78.9 (*)    All other components within normal limits  RESP PANEL BY RT-PCR (FLU A&B, COVID) [id number]    EKG None  Radiology DG Chest 2 View  Result Date: 01/18/2021 CLINICAL DATA:  Possible PICC malpositioning. EXAM: CHEST - 2 VIEW COMPARISON:  Single-view of the chest 01/18/2019. FINDINGS: Right PICC is again seen and unchanged since yesterday's study with its tip projecting at the confluence of the right subclavian and right internal jugular veins. The catheter is looped over the upper right chest. AICD again seen. Cardiomegaly without edema. Lungs clear. No pneumothorax or pleural fluid. IMPRESSION: The patient's right PICC remains looped over the upper right chest with its tip projecting at the confluence of the right subclavian and right internal jugular veins. Cardiomegaly. Electronically Signed   By: Inge Rise M.D.   On: 01/18/2021 13:12   DG Chest  2 View  Result Date: 01/17/2021 CLINICAL DATA:  PICC line placement pulled out EXAM: CHEST - 2 VIEW COMPARISON:  Chest x-ray 06/27/2019 FINDINGS: Left-sided pacing device as before. Cardiomegaly with vascular congestion and mild  interstitial edema. Right-sided central venous catheter has been withdrawn, the tip projects over the subclavian/jugular vein confluence. IMPRESSION: 1. Right-sided central venous catheter tip has been retracted and now projects over the jugular veins/subclavian confluence. 2. Cardiomegaly with vascular congestion and mild interstitial edema Electronically Signed   By: Donavan Foil M.D.   On: 01/17/2021 21:01    Procedures Procedures   Medications Ordered in ED Medications - No data to display  ED Course  I have reviewed the triage vital signs and the nursing notes.  Pertinent labs & imaging results that were available during my care of the patient were reviewed by me and considered in my medical decision making (see chart for details).  Clinical Course as of 01/18/21 2026  Thu Jan 18, 2021  1832 Consult placed to interventional radiology.  Spoke with Dr. Pascal Lux.  They will be able to replace the tunneled PICC tomorrow. [JR]  2014 Dr. Sonia Side with cardiology will admit for IR in the morning. [JR]    Clinical Course User Index [JR] Tavaughn Silguero, [place] N, PA-C   MDM Rules/Calculators/A&P                          Patient with history of severe CHF on continuous milrinone infusion, presenting for evaluation of right PICC line which pulled out sometime over the night Wednesday night.  She states her medications has still been infusing.  States her PICC is normally at 5 inches, it is at 13 inches today reportedly per her home health nurse.  She is not having any symptoms.  Chest x-ray confirms this.  Consult placed to interventional radiology, discussed with Dr. Pascal Lux.  They will be able to replace her PICC tomorrow morning.  Discussed with cardiology who will admit the patient.  There is some concern for discharge due to the importance of the infusing medication, risk of infiltration, risk of the catheter coming out further and interrupting her medication administration.  Patient is agreeable with  plan for admission.     Final Clinical Impression(s) / ED Diagnoses Final diagnoses:  Complication associated with peripherally inserted central catheter (PICC), initial encounter    Rx / DC Orders ED Discharge Orders    None       Tiwanda Threats, [place] N, PA-C 01/18/21 2026    Malvin Johns, MD 01/18/21 [phone]

## 2021-01-18 NOTE — ED Notes (Signed)
Report attempted rn to attempt again  

## 2021-01-19 ENCOUNTER — Observation Stay (HOSPITAL_COMMUNITY): Payer: Medicaid Other

## 2021-01-19 DIAGNOSIS — I5022 Chronic systolic (congestive) heart failure: Secondary | ICD-10-CM | POA: Diagnosis not present

## 2021-01-19 DIAGNOSIS — T82594A Other mechanical complication of infusion catheter, initial encounter: Secondary | ICD-10-CM | POA: Diagnosis not present

## 2021-01-19 HISTORY — PX: IR FLUORO GUIDE CV LINE RIGHT: IMG2283

## 2021-01-19 LAB — BASIC METABOLIC PANEL
Anion gap: 10 (ref 5–15)
Anion gap: 8 (ref 5–15)
BUN: 10 mg/dL (ref 6–20)
BUN: 12 mg/dL (ref 6–20)
CO2: 30 mmol/L (ref 22–32)
CO2: 30 mmol/L (ref 22–32)
Calcium: 9 mg/dL (ref 8.9–10.3)
Calcium: 9.2 mg/dL (ref 8.9–10.3)
Chloride: 97 mmol/L — ABNORMAL LOW (ref 98–111)
Chloride: 97 mmol/L — ABNORMAL LOW (ref 98–111)
Creatinine, Ser: 0.8 mg/dL (ref 0.44–1.00)
Creatinine, Ser: 1.05 mg/dL — ABNORMAL HIGH (ref 0.44–1.00)
GFR, Estimated: >60 mL/min (ref >60)
GFR, Estimated: >60 mL/min (ref >60)
Glucose, Bld: 232 mg/dL — ABNORMAL HIGH (ref 70–99)
Glucose, Bld: 309 mg/dL — ABNORMAL HIGH (ref 70–99)
Potassium: 3.2 mmol/L — ABNORMAL LOW (ref 3.5–5.1)
Potassium: 3.4 mmol/L — ABNORMAL LOW (ref 3.5–5.1)
Sodium: 137 mmol/L (ref 135–145)
Sodium: 135 mmol/L (ref 135–145)

## 2021-01-19 LAB — HEMOGLOBIN A1C
Hgb A1c MFr Bld: 8.6 % — ABNORMAL HIGH (ref 4.8–5.6)
Mean Plasma Glucose: 200.12 mg/dL

## 2021-01-19 LAB — GLUCOSE, CAPILLARY
Glucose-Capillary: 310 mg/dL — ABNORMAL HIGH (ref 70–99)
Glucose-Capillary: 230 mg/dL — ABNORMAL HIGH (ref 70–99)

## 2021-01-19 LAB — COOXEMETRY PANEL
Carboxyhemoglobin: 1.2 % (ref 0.5–1.5)
Carboxyhemoglobin: 1.5 % (ref 0.5–1.5)
Methemoglobin: 0.7 % (ref 0.0–1.5)
Methemoglobin: 0.8 % (ref 0.0–1.5)
O2 Saturation: 56.5 %
O2 Saturation: 72.8 %
Total hemoglobin: 13.7 g/dL (ref 12.0–16.0)
Total hemoglobin: 14.9 g/dL (ref 12.0–16.0)

## 2021-01-19 LAB — MAGNESIUM: Magnesium: 1.4 mg/dL — ABNORMAL LOW (ref 1.7–2.4)

## 2021-01-19 LAB — DIGOXIN LEVEL: Digoxin Level: <0.2 ng/mL — ABNORMAL LOW (ref 0.8–2.0)

## 2021-01-19 MED ORDER — INSULIN ASPART 100 UNIT/ML IJ SOLN: 0.0000 [IU] | Freq: Every day | INTRAMUSCULAR | Status: DC

## 2021-01-19 MED ORDER — POTASSIUM CHLORIDE CRYS ER 20 MEQ PO TBCR: 40.0000 meq | EXTENDED_RELEASE_TABLET | Freq: Once | ORAL | Status: DC

## 2021-01-19 MED ORDER — LIDOCAINE HCL (PF) 1 % IJ SOLN
INTRAMUSCULAR | Status: DC | PRN
  Administered 2021-01-19: 30 mL

## 2021-01-19 MED ORDER — POTASSIUM CHLORIDE CRYS ER 20 MEQ PO TBCR
60.0000 meq | EXTENDED_RELEASE_TABLET | Freq: Once | ORAL | Status: AC
  Administered 2021-01-19: 60 meq via ORAL
  Filled 2021-01-19: qty 3

## 2021-01-19 MED ORDER — CHLORHEXIDINE GLUCONATE CLOTH 2 % EX PADS
6.0000 | MEDICATED_PAD | Freq: Every day | CUTANEOUS | Status: DC
  Administered 2021-01-19: 6 via TOPICAL

## 2021-01-19 MED ORDER — HEPARIN SOD (PORK) LOCK FLUSH 100 UNIT/ML IV SOLN
INTRAVENOUS | Status: AC
  Filled 2021-01-19: qty 5

## 2021-01-19 MED ORDER — LIDOCAINE HCL 1 % IJ SOLN
INTRAMUSCULAR | Status: AC
  Filled 2021-01-19: qty 20

## 2021-01-19 MED ORDER — INSULIN ASPART 100 UNIT/ML IJ SOLN: 0.0000 [IU] | Freq: Three times a day (TID) | INTRAMUSCULAR | Status: DC

## 2021-01-19 MED ORDER — MAGNESIUM SULFATE 4 GM/100ML IV SOLN
4.0000 g | Freq: Once | INTRAVENOUS | Status: AC
  Administered 2021-01-19: 4 g via INTRAVENOUS
  Filled 2021-01-19: qty 100

## 2021-01-19 MED ORDER — INSULIN ASPART 100 UNIT/ML IJ SOLN
0.0000 [IU] | Freq: Three times a day (TID) | INTRAMUSCULAR | Status: DC
  Administered 2021-01-19: 7 [IU] via SUBCUTANEOUS
  Administered 2021-01-19: 3 [IU] via SUBCUTANEOUS

## 2021-01-19 NOTE — Procedures (Signed)
Pre-procedure Diagnosis: Poor venous access; Chronic heart failure Post-procedure Diagnosis: Same  Successful fluoro guided replacement of tunneled SL CVC with tip terminating within the superior aspect of the right atrium.    Complications: None Immediate EBL: None  The catheter is ready for immediate use.   Katherina Right, MD Pager #: [phone]

## 2021-01-19 NOTE — TOC Transition Note (Signed)
Transition of Care [facility]) - CM/SW Discharge Note Heart Failure   Patient Details  Name: Lariza Cothron MRN: [id number] Date of Birth: [birth year]  Transition of Care [facility]) CM/SW Contact:  Kaelum Kissick, LCSWA Phone Number: 01/19/2021, 1:15 PM   Clinical Narrative:    CSW spoke with the patient at bedside and completed a very brief SDOH screening with the patient who denied having any needs at this time. Patient reported they do have a PCP and she uses [facility] and they deliver her medications. Ms. Virgo reported that she uses [employer] to get to appointments and will use them for discharge today. CSW brought the patient an appointment card and reiterated to the patient to follow up with the [facility] appointment.  CSW will sign off for now as social work intervention is no longer needed. Please consult [place] again if new needs arise.  Final next level of care: Home/Self Care Barriers to Discharge: No Barriers Identified   Patient Goals and CMS Choice Patient states their goals for this hospitalization and ongoing recovery are:: to return home      Discharge Placement                       Discharge Plan and Services In-house Referral: Clinical Social Work                                   Social Determinants of Health (SDOH) Interventions Food Insecurity Interventions: Intervention Not Indicated Financial Strain Interventions: Intervention Not Indicated (patient denied having needs at this time.) Housing Interventions: Intervention Not Indicated Transportation Interventions: Other Orthoptist (Patient already enrolled in [employer])   Readmission Risk Interventions No flowsheet data found.  Breshae Belcher, MSW, LCSWA [phone] Heart Failure Social Worker

## 2021-01-19 NOTE — TOC Progression Note (Signed)
Transition of Care [facility]) - Progression Note    Patient Details  Name: Kirsten Wheeler MRN: [id number] Date of Birth: 10-25-1980  [facility]) CM/SW Contact  Bess Kinds, RN Phone Number: [phone] 01/19/2021, 12:45 PM  Clinical Narrative:     Notified of patient interest in home [facility] system. Provided brochure from [employer]. Advised that not covered by insurance at this time; however, payment arrangements can be made. Patient expressed appreciation for the information.        Expected Discharge Plan and Services           Expected Discharge Date: 01/19/21                                     Social Determinants of Health (SDOH) Interventions Food Insecurity Interventions: Intervention Not Indicated Financial Strain Interventions: Intervention Not Indicated (patient denied having needs at this time.) Housing Interventions: Intervention Not Indicated Transportation Interventions: Other Orthoptist (Patient already enrolled in [employer])  Readmission Risk Interventions No flowsheet data found.

## 2021-01-19 NOTE — Progress Notes (Signed)
Heart Failure Nurse Navigator Progress Note  Pt follows with AHF clinic.   Navigator available as needed for resources and education.  Ozella Rocks, RN, BSN Heart Failure Nurse Navigator [phone]

## 2021-01-19 NOTE — Plan of Care (Signed)

## 2021-01-19 NOTE — Progress Notes (Signed)
D/C instructions given and reviewed. Awaiting bag IV medication to be changed prior to D/C.

## 2021-01-19 NOTE — Progress Notes (Signed)
Consulted about the PICC malposition. RN made aware that PICC was placed by IR and recommended to consult IR.

## 2021-01-19 NOTE — Discharge Summary (Addendum)
Advanced Heart Failure Team  Discharge Summary   [name] MRN: [id number], DOB/AGE: 11/03/1980 40 y.o. Admit date: 01/18/2021 D/C date:     01/19/2021   Primary Discharge Diagnoses:  Chronic Systolic HF   On chronic milrinone. Tunneled PICC replaced Hyperlipidemia  DMII Obesity  Hospital Course:  Kirsten Wheeler is a 40 y.o. 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 She is on chronic home milrinone 0.25 mcg.  PICC line was displaced. IR consulted and tunneled PICC was replaced. From HF perspective stable. K and Mag replaced.   [facility] will provide home milrinone today. D/C after home milrinone restarted.     Discharge Vitals: Blood pressure (!) 95/55, pulse 60, temperature 98.5 F (36.9 C), temperature source Oral, resp. rate 18, height _0  (1.651 m), weight (!) 164.9 kg, SpO2 98 %, unknown if currently breastfeeding.  General:  Well appearing. No resp difficulty HEENT: normal Neck: supple. no JVD. Carotids 2+ bilat; no bruits. No lymphadenopathy or thryomegaly appreciated. Cor: PMI nondisplaced. Regular rate & rhythm. No rubs, gallops or murmurs. R upper chest tunneled PICC  Lungs: clear Abdomen: soft, nontender, nondistended. No hepatosplenomegaly. No bruits or masses. Good bowel sounds. Extremities: no cyanosis, clubbing, rash, edema Neuro: alert & orientedx3, cranial nerves grossly intact. moves all 4 extremities w/o difficulty. Affect pleasant  Labs: Lab Results  Component Value Date   WBC 8.0 01/18/2021   HGB 13.6 01/18/2021   HCT 40.1 01/18/2021   MCV 78.9 (L) 01/18/2021   PLT 289 01/18/2021    Recent Labs  Lab 01/19/21 0433  NA 137  K 3.2*  CL 97*  CO2 30  BUN 10  CREATININE 0.80  CALCIUM 9.0  GLUCOSE 232*   Lab Results  Component Value Date   CHOL 144 08/24/2020   HDL 38 (L) 08/24/2020   LDLCALC 75 08/24/2020   TRIG 181 (H) 08/24/2020   BNP (last 3 results) No results for input(s): BNP in the last 8760  hours.  ProBNP (last 3 results) No results for input(s): PROBNP in the last 8760 hours.   Diagnostic Studies/Procedures   DG Chest 2 View  Result Date: 01/18/2021 CLINICAL DATA:  Possible PICC malpositioning. EXAM: CHEST - 2 VIEW COMPARISON:  Single-view of the chest 01/18/2019. FINDINGS: Right PICC is again seen and unchanged since yesterday's study with its tip projecting at the confluence of the right subclavian and right internal jugular veins. The catheter is looped over the upper right chest. AICD again seen. Cardiomegaly without edema. Lungs clear. No pneumothorax or pleural fluid. IMPRESSION: The patient's right PICC remains looped over the upper right chest with its tip projecting at the confluence of the right subclavian and right internal jugular veins. Cardiomegaly. Electronically Signed   By: Inge Rise M.D.   On: 01/18/2021 13:12   DG Chest 2 View  Result Date: 01/17/2021 CLINICAL DATA:  PICC line placement pulled out EXAM: CHEST - 2 VIEW COMPARISON:  Chest x-ray 06/27/2019 FINDINGS: Left-sided pacing device as before. Cardiomegaly with vascular congestion and mild interstitial edema. Right-sided central venous catheter has been withdrawn, the tip projects over the subclavian/jugular vein confluence. IMPRESSION: 1. Right-sided central venous catheter tip has been retracted and now projects over the jugular veins/subclavian confluence. 2. Cardiomegaly with vascular congestion and mild interstitial edema Electronically Signed   By: Donavan Foil M.D.   On: 01/17/2021 21:01    Discharge Medications   Allergies as of 01/19/2021  Reactions   Metformin Diarrhea   And caused severe "dry mouth," also   Metformin And Related Diarrhea   And caused severe "dry mouth," also      Medication List    TAKE these medications   3 Series BP Monitor/Upper Arm Devi Check BP Daily   Accu-Chek Softclix Lancets lancets Use as instructed   atorvastatin 10 MG tablet Commonly known as:  LIPITOR TAKE 1 TABLET (10 MG TOTAL) BY MOUTH DAILY (NOON) What changed: See the new instructions.   carvedilol 12.5 MG tablet Commonly known as: COREG TAKE ONE TABLET BY MOUTH TWICE DAILY (AM+BEDTIME) What changed: See the new instructions.   Corlanor 7.5 MG Tabs tablet Generic drug: ivabradine TAKE 1 TABLET BY MOUTH 2 (TWO) TIMES DAILY WITH A MEAL (AM+BEDTIME) What changed: See the new instructions.   dapagliflozin propanediol 10 MG Tabs tablet Commonly known as: FARXIGA Take 10 mg by mouth in the morning.   digoxin 0.125 MG tablet Commonly known as: LANOXIN TAKE 1 TABLET (0.125 MG TOTAL) BY MOUTH DAILY (NOON) What changed: See the new instructions.   Entresto 97-103 MG Generic drug: sacubitril-valsartan Take 1 tablet by mouth 2 (two) times daily.   ipratropium-albuterol 0.5-2.5 (3) MG/3ML Soln Commonly known as: DUONEB Take 3 mLs by nebulization every 4 (four) hours as needed.   Liletta (52 MG) 20.1 MCG/DAY Iud Generic drug: levonorgestrel 1 each by Intrauterine route once.   magnesium oxide 400 (240 Mg) MG tablet Commonly known as: MAG-OX Take 2 tablets by mouth every morning and 1 tablet by mouth every evening   metolazone 2.5 MG tablet Commonly known as: ZAROXOLYN As directed by HF clinic   milrinone 20 MG/100 ML Soln infusion Commonly known as: PRIMACOR Inject 0.0414 mg/min into the vein continuous.   potassium chloride 10 MEQ tablet Commonly known as: KLOR-CON TAKE 8 TABS IN THE MORNING, 6 TABS IN THE AFTERNOON, AND 6 TABS IN THE EVENING What changed:   how much to take  how to take this  when to take this  additional instructions   spironolactone 25 MG tablet Commonly known as: ALDACTONE TAKE ONE TABLET BY MOUTH ONCE DAILY (NOON) What changed: See the new instructions.   torsemide 100 MG tablet Commonly known as: DEMADEX TAKE ONE TABLET BY MOUTH TWICE DAILY (AM+NOON) What changed: See the new instructions.   True Metrix Blood Glucose Test  test strip Generic drug: glucose blood Use as instructed   Accu-Chek Guide test strip Generic drug: glucose blood Check blood sugars daily before breakfast.   True Metrix Meter w/Device Kit Use as directed   Accu-Chek Guide w/Device Kit Use as directed   Trulicity 1.5 WN/0.2VO Sopn Generic drug: Dulaglutide Inject 1.5 mg into the skin once a week.            Durable Medical Equipment  (From admission, onward)         Start     Ordered   01/19/21 1220  Heart failure home health orders  (Heart failure home health orders / Face to face)  Once       Comments: Heart Failure Follow-up Care:  Verify follow-up appointments per Patient Discharge Instructions. Confirm transportation arranged. Reconcile home medications with discharge medication list. Remove discontinued medications from use. Assist patient/caregiver to manage medications using pill box. Reinforce low sodium food selection Assessments: Vital signs and oxygen saturation at each visit. Assess home environment for safety concerns, caregiver support and availability of low-sodium foods. Consult Education officer, museum, PT/OT, Microbiologist, and CNA  based on assessments. Perform comprehensive cardiopulmonary assessment. Notify MD for any change in condition or weight gain of 3 pounds in one day or 5 pounds in one week with symptoms. Daily Weights and Symptom Monitoring: Ensure patient has access to scales. Teach patient/caregiver to weigh daily before breakfast and after voiding using same scale and record.    Teach patient/caregiver to track weight and symptoms and when to notify Provider. Activity: Develop individualized activity plan with patient/caregiver.   Home milrinone 0.25 mcg  Home Paraenteral Inotropic Therapy : Data Collection Form  Patients name: Pari Lombard   Date: 01/19/21  Information below may not be completed by the supplier nor anyone in a Financial relationship with the supplier.  1. Results of invasive  hemodynamic monitoring  Cardiac Index Before Inotrope infusion:            1.6            On Inotrope infusion:            2            Drug and dose:   Milrinone 0.25 mcg.   2. Cardiac medications immediately prior to inotrope infusion (List name, dose, and frequency) Torsemide   3. Dose this represent maximum tolerated doses of these medications? Yes.   4. Breathing status Prior to inotrope infusion: Dyspnea at rest  At time of discharge: Dyspnea on moderate  exertion.   5. Initial home prescription Drug and Dose:   0.25 mcg  for continuous infusion 24/hr day and 7 days/week  6. If continuous infusion is prescribed, have attempts to discontinue inotrope infusion in the hospital failed?   Yes.   7. If intermittent infusion is prescribed, have there been repeated hospitalizations for heart failure which Parenteral inotrope were required? Not applicable.   8. Is patient capable of going to the physician for outpatient evaluation? Yes.    9. Is routine electrocardiographic monitoring required in the Home?  No.   The above statements and any additional explanations included separately are true and accurate and there is documentation present in the patients medical record to support these statements.   Completed by Darrick Grinder, NP   In instances where this form was completed by an Advanced Practice Provider, please see EMR for physician Co-Signature.  [facility] to provide  Labs every other week to include BMET, Mg, and CBC with Diff. Additional as needed. Should be drawn via PERIPHERAL stick. NOT PICC line.   F0932 Milrinone 0.25 mcg /kg/min X 52 weeks A4221 Supplies for maintenance of drug infusion catheter A4222 Supplies for the external drug infusion per cassette or bag E0781 Ambulatory Infusion pump  Question Answer Comment  Heart Failure Follow-up Care [facility] at [phone]   Obtain the following labs Basic Metabolic Panel   Lab frequency Weekly    Fax lab results to A[facility] Clinic at [phone]   Diet Low Sodium Heart Healthy   Fluid restrictions: 2000 mL Fluid      01/19/21 1221          Disposition   The patient will be discharged in stable condition to home with home milrinone infusing.  Discharge Instructions    (HEART FAILURE PATIENTS) Call MD:  Anytime you have any of the following symptoms: 1) 3 pound weight gain in 24 hours or 5 pounds in 1 week 2) shortness of breath, with or without a dry hacking cough 3) swelling in the hands, feet or stomach 4) if you  have to sleep on extra pillows at night in order to breathe.   Complete by: As directed    Diet - low sodium heart healthy   Complete by: As directed    Heart Failure patients record your daily weight using the same scale at the same time of day   Complete by: As directed    Increase activity slowly   Complete by: As directed          Duration of Discharge Encounter: Greater than 35 minutes   Signed, [name] Clegg NP-C  01/19/2021, 12:27 PM  Agree with the above NP note.    Patient is stable on milrinone, can be discharged after PICC replaced.  No med changes.   Loralie Champagne 01/19/2021

## 2021-01-19 NOTE — Progress Notes (Signed)
Patient identified as following with advanced HF team for home milrinone, spoke with Dr. Shirlee Latch who reports they will have APP see for their team to pick up. I spoke with IR to ensure they have been consulted and they acknowledged receipt of consult - will be calling for patient shortly.

## 2021-01-22 ENCOUNTER — Ambulatory Visit (INDEPENDENT_AMBULATORY_CARE_PROVIDER_SITE_OTHER): Payer: Medicaid Other

## 2021-01-22 ENCOUNTER — Telehealth: Payer: Self-pay

## 2021-01-22 DIAGNOSIS — I428 Other cardiomyopathies: Secondary | ICD-10-CM | POA: Diagnosis not present

## 2021-01-22 NOTE — Telephone Encounter (Signed)
Transition Care Management Follow-up Telephone Call  Date of discharge and from where: 01/19/2021, [facility]   How have you been since you were released from the hospital? She said she is feeling good.   Any questions or concerns? No , other than needing a ride to the sleep lab for her sleep study - 01/25/2021. Marland Kitchen Kerry Hough, EMT will check with Rosetta Posner, LCSW about transportation options.    Items Reviewed:  Did the pt receive and understand the discharge instructions provided? Yes   Medications obtained and verified? Yes , she said she has all medications and did not have any questions about her med regime   Other? No   Any new allergies since your discharge? No   Dietary orders reviewed? No  Do you have support at home? she said her teenage daughters are with her.   Home Care and Equipment/Supplies: Were home health services ordered? yes If so, what is the name of the agency? Patient said - [facility] - she was receiving services prior to this hospitalization.  She has a continuous milrinone infusion.  The home health nurse comes weekly for PICC dressing change, labwork and medication bag change.  Has the agency set up a time to come to the patient's home? yes Were any new equipment or medical supplies ordered?  No What is the name of the medical supply agency? n/a Were you able to get the supplies/equipment? not applicable Do you have any questions related to the use of the equipment or supplies? No   Patient has a glucometer and home BP monitor.   Patient is also enrolled with the [employer]. [place] home visits from [place], [place].  Duoneb is on her medication list but she it has not been ordered for quite awhile and she does not have a nebulizer.     Functional Questionnaire: (I = Independent and D = Dependent) ADLs: independent    Follow up appointments reviewed:   PCP Hospital f/u appt confirmed? Yes  - Dr  Laural Benes 03/13/2021.  She did not want to schedule an appointment to be seen sooner at another clinic.  Specialist Hospital f/u appt confirmed? Yes  - cardiology-02/07/2021. She also has a sleep study scheduled for 01/25/2021   Are transportation arrangements needed? she uses [employer]  If their condition worsens, is the pt aware to call PCP or go to the Emergency Dept.? Yes  Was the patient provided with contact information for the PCP's office or ED? Yes  Was to pt encouraged to call back with questions or concerns? Yes

## 2021-01-23 ENCOUNTER — Other Ambulatory Visit (HOSPITAL_COMMUNITY): Payer: Self-pay

## 2021-01-23 LAB — CUP PACEART REMOTE DEVICE CHECK
Battery Remaining Longevity: 95 mo
Battery Voltage: 2.98 V
Brady Statistic RV Percent Paced: 0 %
Date Time Interrogation Session: 20220509151808
HighPow Impedance: 61 Ohm
Implantable Lead Implant Date: 20180802
Implantable Lead Location: 753860
Implantable Pulse Generator Implant Date: 20180802
Lead Channel Impedance Value: 342 Ohm
Lead Channel Impedance Value: 456 Ohm
Lead Channel Pacing Threshold Amplitude: 0.75 V
Lead Channel Pacing Threshold Pulse Width: 0.4 ms
Lead Channel Sensing Intrinsic Amplitude: 8.875 mV
Lead Channel Sensing Intrinsic Amplitude: 8.875 mV
Lead Channel Setting Pacing Amplitude: 2.5 V
Lead Channel Setting Pacing Pulse Width: 0.4 ms
Lead Channel Setting Sensing Sensitivity: 0.3 mV

## 2021-01-23 NOTE — Progress Notes (Signed)
Paramedicine Encounter    [name], female    DOB: 1980-12-07, 40 y.o.   MRN: [id number]   Patient Care Team: Ladell Pier, MD as PCP - General (Internal Medicine) Constance Haw, MD as PCP - Electrophysiology (Cardiology) Larey Dresser, MD as PCP - Advanced Heart Failure (Cardiology) Jorge Ny, LCSW as Social Worker (Licensed Clinical Social Worker)  Patient Active Problem List   Diagnosis Date Noted  . Heart failure ([place]) 01/18/2021  . IUD (intrauterine device) in place 10/25/2020  . Influenza vaccine refused 08/24/2020  . Anxiety and depression 08/24/2020  . 23-polyvalent pneumococcal polysaccharide vaccine declined 08/24/2020  . Tobacco dependence 08/24/2020  . Acute on chronic systolic CHF (congestive heart failure) ([place]) 06/17/2019  . Obstructive sleep apnea 06/17/2019  . 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 Diabetes mellitus ([place]) 06/17/2019  . Amenorrhea 08/03/2018  . Congestive heart failure ([place]) 06/29/2018  . Hypotension 06/07/2018  . Rhinovirus infection 06/06/2018  . Asthma 06/05/2018  . Hypokalemia 06/05/2018  . Snoring 09/23/2016  . Cough 04/10/2016  . NICM (nonischemic cardiomyopathy) ([place]) 01/25/2016  . Tobacco abuse 01/25/2016  . Acute on chronic combined systolic and diastolic CHF (congestive heart failure) ([place]) 01/25/2016  . Mitral regurgitation   . Abnormal EKG-inf TWI 01/15/2016  . Positive D dimer-CTA neg for PE 01/15/2016  . Morbid obesity- BMI 57 01/14/2016  . Abdominal pain 01/14/2016  . 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 At risk for sleep apnea 01/14/2016  . Cardiomyopathy ([place]) 11/01/2015  . Morbid obesity with BMI of 50.0-59.9, adult ([place]) 08/16/2015    Current Outpatient Medications:  .  Accu-Chek Softclix Lancets lancets, Use as instructed, Disp: 100 each, Rfl: 12 .  atorvastatin (LIPITOR) 10 MG tablet, TAKE 1 TABLET (10 MG TOTAL) BY MOUTH DAILY (NOON) (Patient taking  differently: Take 10 mg by mouth daily at 12 noon.), Disp: 90 tablet, Rfl: 3 .  Blood Glucose Monitoring Suppl (ACCU-CHEK GUIDE) w/Device KIT, Use as directed, Disp: 1 kit, Rfl: 0 .  Blood Glucose Monitoring Suppl (TRUE METRIX METER) w/Device KIT, Use as directed, Disp: 1 kit, Rfl: 0 .  Blood Pressure Monitoring (3 SERIES BP MONITOR/UPPER ARM) DEVI, Check BP Daily, Disp: 1 each, Rfl: 0 .  carvedilol (COREG) 12.5 MG tablet, TAKE ONE TABLET BY MOUTH TWICE DAILY (AM+BEDTIME) (Patient taking differently: Take 12.5 mg by mouth 2 (two) times daily with a meal.), Disp: 180 tablet, Rfl: 3 .  CORLANOR 7.5 MG TABS tablet, TAKE 1 TABLET BY MOUTH 2 (TWO) TIMES DAILY WITH A MEAL (AM+BEDTIME) (Patient taking differently: Take 7.5 mg by mouth in the morning and at bedtime.), Disp: 60 tablet, Rfl: 3 .  dapagliflozin propanediol (FARXIGA) 10 MG TABS tablet, Take 10 mg by mouth in the morning., Disp: , Rfl:  .  digoxin (LANOXIN) 0.125 MG tablet, TAKE 1 TABLET (0.125 MG TOTAL) BY MOUTH DAILY (NOON) (Patient taking differently: Take 0.125 mg by mouth daily at 12 noon.), Disp: 90 tablet, Rfl: 3 .  Dulaglutide (TRULICITY) 1.5 TD/1.7OH SOPN, Inject 1.5 mg into the skin once a week., Disp: , Rfl:  .  glucose blood (ACCU-CHEK GUIDE) test strip, Check blood sugars daily before breakfast., Disp: 100 each, Rfl: 12 .  glucose blood (TRUE METRIX BLOOD GLUCOSE TEST) test strip, Use as instructed, Disp: 100 each, Rfl: 12 .  ipratropium-albuterol (DUONEB) 0.5-2.5 (3) MG/3ML SOLN, Take 3 mLs by nebulization every 4 (four) hours as needed. (Patient taking differently: Take 3 mLs by nebulization every 4 (  four) hours as needed (sob).), Disp: 360 mL, Rfl: 0 .  levonorgestrel (LILETTA, 52 MG,) 19.5 MCG/DAY IUD IUD, 1 each by Intrauterine route once., Disp: , Rfl:  .  Magnesium Oxide 400 (240 Mg) MG TABS, Take 2 tablets by mouth every morning and 1 tablet by mouth every evening, Disp: 90 tablet, Rfl: 6 .  metolazone (ZAROXOLYN) 2.5 MG  tablet, As directed by HF clinic (Patient taking differently: Take 2.5 mg by mouth once a week. As directed by HF clinic), Disp: 10 tablet, Rfl: 3 .  milrinone (PRIMACOR) 20 MG/100 ML SOLN infusion, Inject 0.0414 mg/min into the vein continuous., Disp:  , Rfl:  .  potassium chloride (KLOR-CON) 10 MEQ tablet, TAKE 8 TABS IN THE MORNING, 6 TABS IN THE AFTERNOON, AND 6 TABS IN THE EVENING (Patient taking differently: Take 60-80 mEq by mouth See admin instructions. Take 19mq in the morning, 660m in the afternoon, and 6077min the evening.), Disp: 600 tablet, Rfl: 11 .  sacubitril-valsartan (ENTRESTO) 97-103 MG, Take 1 tablet by mouth 2 (two) times daily., Disp: 60 tablet, Rfl: 6 .  spironolactone (ALDACTONE) 25 MG tablet, TAKE ONE TABLET BY MOUTH ONCE DAILY (NOON) (Patient taking differently: Take 25 mg by mouth daily at 12 noon.), Disp: 90 tablet, Rfl: 3 .  torsemide (DEMADEX) 100 MG tablet, TAKE ONE TABLET BY MOUTH TWICE DAILY (AM+NOON) (Patient taking differently: Take 100 mg by mouth 2 (two) times daily.), Disp: 60 tablet, Rfl: 11 Allergies  Allergen Reactions  . Metformin Diarrhea    And caused severe "dry mouth," also  . Metformin And Related Diarrhea    And caused severe "dry mouth," also      Social History   Socioeconomic History  . Marital status: Single    Spouse name: Not on file  . Number of children: 2  . Years of education: Not on file  . Highest education level: Some college, no degree  Occupational History  . Occupation: Disability  Tobacco Use  . Smoking status: Former Smoker    Types: Cigarettes    Quit date: 07/18/2016    Years since quitting: 4.5  . Smokeless tobacco: Never Used  . Tobacco comment: 3 cigarettes per day  Vaping Use  . Vaping Use: Never used  Substance and Sexual Activity  . Alcohol use: Not Currently    Alcohol/week: 0.0 standard drinks    Comment: occasionally  . Drug use: Not Currently    Types: Marijuana    Comment: only socially  .  Sexual activity: Yes    Birth control/protection: None  Other Topics Concern  . Not on file  Social History Narrative  . Not on file   Social Determinants of Health   Financial Resource Strain: Low Risk   . Difficulty of Paying Living Expenses: Not very hard  Food Insecurity: No Food Insecurity  . Worried About [employer] fundraiser the Last Year: Never true  . Ran Out of Food in the Last Year: Never true  Transportation Needs: No Transportation Needs  . Lack of Transportation (Medical): No  . Lack of Transportation (Non-Medical): No  Physical Activity: Not on file  Stress: Not on file  Social Connections: Not on file  Intimate Partner Violence: Not on file    Physical Exam      Future Appointments  Date Time Provider DepBureau/08/2021  8:00 PM TurSueanne MargaritaD [facility]  02/07/2021 11:00 AM [facility] ECHO OP 1 [facility]/25/2022 12:00  PM [facility] PA/NP [facility] None  03/13/2021  1:30 PM Ladell Pier, MD [facility] None  04/23/2021  8:35 AM [facility] DEVICE REMOTES [facility]  07/23/2021  8:35 AM [facility] DEVICE REMOTES [facility]  10/22/2021  8:35 AM [facility] DEVICE REMOTES [facility]  01/21/2022  8:35 AM [facility] DEVICE REMOTES [facility]    BP (!) 92/0   Pulse (!) 106   Temp 99.3 F (37.4 C)   Resp (!) 22   SpO2 98%  Weight today-? Weight yesterday-? Last visit weight-358 @ clinic   Pt home from [place] a few days ago from having to have her PICC line replaced-it had came out from 5" to 13" over night. She must have been a busy sleeper and tossed and turned and pulled it out, she thinks.  Today she is not feeling well. She has a cough, sometimes productive, throat sore, sinus area hurts, headaches, runny nose, sounds wheezy, she sounds horrible. Since yesterday she has not felt well.  Her roommate has been sick as well with same symptoms.  Pt reports taking all her meds.  She has been  using her glucometer she states bad day-280 in evening but mornings is around 120-130.  Taking trulicity once a week.  She said she called the sleep study to resch it from this week due to her not feeling well. Its still listed for this week and it has not been resch so I advised her to call them again to ensure its been done.  Her lungs sounded clear, but Opal Sidles is going to f/u with PCP about the need for neb.  Not able to weigh due to dead battery. She will get some for it. She feels like her weight is same though.    [name], [facility] Paramedic  01/23/21

## 2021-01-24 ENCOUNTER — Other Ambulatory Visit (HOSPITAL_COMMUNITY): Payer: Self-pay | Admitting: Cardiology

## 2021-01-24 DIAGNOSIS — Z6841 Body Mass Index (BMI) 40.0 and over, adult: Secondary | ICD-10-CM | POA: Diagnosis not present

## 2021-01-24 DIAGNOSIS — O903 Peripartum cardiomyopathy: Secondary | ICD-10-CM | POA: Diagnosis not present

## 2021-01-24 DIAGNOSIS — Z79899 Other long term (current) drug therapy: Secondary | ICD-10-CM | POA: Diagnosis not present

## 2021-01-24 DIAGNOSIS — I5022 Chronic systolic (congestive) heart failure: Secondary | ICD-10-CM | POA: Diagnosis not present

## 2021-01-24 DIAGNOSIS — F17210 Nicotine dependence, cigarettes, uncomplicated: Secondary | ICD-10-CM | POA: Diagnosis not present

## 2021-01-24 DIAGNOSIS — I272 Pulmonary hypertension, unspecified: Secondary | ICD-10-CM | POA: Diagnosis not present

## 2021-01-24 DIAGNOSIS — E119 Type 2 diabetes mellitus without complications: Secondary | ICD-10-CM | POA: Diagnosis not present

## 2021-01-24 DIAGNOSIS — G4733 Obstructive sleep apnea (adult) (pediatric): Secondary | ICD-10-CM | POA: Diagnosis not present

## 2021-01-24 DIAGNOSIS — Z7984 Long term (current) use of oral hypoglycemic drugs: Secondary | ICD-10-CM | POA: Diagnosis not present

## 2021-01-24 DIAGNOSIS — I5023 Acute on chronic systolic (congestive) heart failure: Secondary | ICD-10-CM | POA: Diagnosis not present

## 2021-01-24 DIAGNOSIS — Z452 Encounter for adjustment and management of vascular access device: Secondary | ICD-10-CM | POA: Diagnosis not present

## 2021-01-24 DIAGNOSIS — J45909 Unspecified asthma, uncomplicated: Secondary | ICD-10-CM | POA: Diagnosis not present

## 2021-01-24 DIAGNOSIS — I340 Nonrheumatic mitral (valve) insufficiency: Secondary | ICD-10-CM | POA: Diagnosis not present

## 2021-01-25 ENCOUNTER — Encounter (HOSPITAL_BASED_OUTPATIENT_CLINIC_OR_DEPARTMENT_OTHER): Payer: Medicaid Other | Admitting: Cardiology

## 2021-01-31 DIAGNOSIS — Z79899 Other long term (current) drug therapy: Secondary | ICD-10-CM | POA: Diagnosis not present

## 2021-01-31 DIAGNOSIS — Z452 Encounter for adjustment and management of vascular access device: Secondary | ICD-10-CM | POA: Diagnosis not present

## 2021-01-31 DIAGNOSIS — J45909 Unspecified asthma, uncomplicated: Secondary | ICD-10-CM | POA: Diagnosis not present

## 2021-01-31 DIAGNOSIS — E119 Type 2 diabetes mellitus without complications: Secondary | ICD-10-CM | POA: Diagnosis not present

## 2021-01-31 DIAGNOSIS — Z7984 Long term (current) use of oral hypoglycemic drugs: Secondary | ICD-10-CM | POA: Diagnosis not present

## 2021-01-31 DIAGNOSIS — G4733 Obstructive sleep apnea (adult) (pediatric): Secondary | ICD-10-CM | POA: Diagnosis not present

## 2021-01-31 DIAGNOSIS — O903 Peripartum cardiomyopathy: Secondary | ICD-10-CM | POA: Diagnosis not present

## 2021-01-31 DIAGNOSIS — I272 Pulmonary hypertension, unspecified: Secondary | ICD-10-CM | POA: Diagnosis not present

## 2021-01-31 DIAGNOSIS — I340 Nonrheumatic mitral (valve) insufficiency: Secondary | ICD-10-CM | POA: Diagnosis not present

## 2021-01-31 DIAGNOSIS — Z6841 Body Mass Index (BMI) 40.0 and over, adult: Secondary | ICD-10-CM | POA: Diagnosis not present

## 2021-01-31 DIAGNOSIS — I5022 Chronic systolic (congestive) heart failure: Secondary | ICD-10-CM | POA: Diagnosis not present

## 2021-01-31 DIAGNOSIS — I5023 Acute on chronic systolic (congestive) heart failure: Secondary | ICD-10-CM | POA: Diagnosis not present

## 2021-01-31 DIAGNOSIS — F17210 Nicotine dependence, cigarettes, uncomplicated: Secondary | ICD-10-CM | POA: Diagnosis not present

## 2021-02-02 ENCOUNTER — Encounter (HOSPITAL_COMMUNITY): Payer: Medicaid Other | Admitting: Cardiology

## 2021-02-05 NOTE — Progress Notes (Deleted)
Electrophysiology Office Note Date: 02/05/2021  ID:  Kirsten, Wheeler February 25, 1981, MRN [id number]  PCP: Ladell Pier, MD Primary Cardiologist: Aundra Dubin Electrophysiologist: Curt Bears  CC: Routine ICD follow-up  Kirsten Wheeler is a 40 y.o. female seen today for Dr Curt Bears.  She presents today for routine electrophysiology followup.  Since last being seen in our clinic, the patient reports doing very well. She denies chest pain, palpitations, dyspnea, PND, orthopnea, nausea, vomiting, dizziness, syncope, edema, weight gain, or early satiety.  She has not had ICD shocks.   Device History: MDT single chamber ICD implanted 2018 for NICM History of appropriate therapy: No History of AAD therapy: No   Past Medical History:  Diagnosis Date  . Asthma   . Chronic systolic CHF (congestive heart failure) ([place])    a. ECHO 01/15/2016 EF 10-15%. 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  . Diabetes mellitus ([place]) 06/17/2019  . Hearing loss    bilateral  . Mitral regurgitation    a. severe, felt to be functional 2/2 LV dilation   . Morbid obesity ([place])   . Snoring    Past Surgical History:  Procedure Laterality Date  . CARDIAC CATHETERIZATION N/A 01/18/2016   Procedure: Right/Left Heart Cath and Coronary Angiography;  Surgeon: Larey Dresser, MD;  Location: [place] [facility];  Service: Cardiovascular;  Laterality: N/A;  . CESAREAN SECTION    . ICD IMPLANT N/A 04/17/2017   Procedure: ICD Implant;  Surgeon: Constance Haw, MD;  Location: [place] [facility];  Service: Cardiovascular;  Laterality: N/A;  . IR FLUORO GUIDE CV LINE RIGHT  06/23/2019  . IR FLUORO GUIDE CV LINE RIGHT  10/15/2019  . IR FLUORO GUIDE CV LINE RIGHT  01/19/2021  . IR REMOVAL TUN CV CATH W/O FL  10/15/2019  . IR US GUIDE VASC ACCESS RIGHT  06/23/2019  . IR US GUIDE VASC ACCESS RIGHT  10/15/2019  . LEAD REVISION/REPAIR N/A 04/21/2017   Procedure: Lead Revision/Repair;  Surgeon: Constance Haw, MD;  Location: [facility];  Service: Cardiovascular;  Laterality: N/A;  . RIGHT HEART CATH N/A 06/21/2019   Procedure: RIGHT HEART CATH;  Surgeon: Larey Dresser, MD;  Location: [place] [facility];  Service: Cardiovascular;  Laterality: N/A;  . TONSILLECTOMY    . UNILATERAL SALPINGECTOMY  11/15/2007   unsure of which tube, ectopic pregnancy     Current Outpatient Medications  Medication Sig Dispense Refill  . Accu-Chek Softclix Lancets lancets Use as instructed 100 each 12  . atorvastatin (LIPITOR) 10 MG tablet TAKE 1 TABLET (10 MG TOTAL) BY MOUTH DAILY (NOON) (Patient taking differently: Take 10 mg by mouth daily at 12 noon.) 90 tablet 3  . Blood Glucose Monitoring Suppl (ACCU-CHEK GUIDE) w/Device KIT Use as directed 1 kit 0  . Blood Glucose Monitoring Suppl (TRUE METRIX METER) w/Device KIT Use as directed 1 kit 0  . Blood Pressure Monitoring (3 SERIES BP MONITOR/UPPER ARM) DEVI Check BP Daily 1 each 0  . carvedilol (COREG) 12.5 MG tablet TAKE ONE TABLET BY MOUTH TWICE DAILY (AM+BEDTIME) (Patient taking differently: Take 12.5 mg by mouth 2 (two) times daily with a meal.) 180 tablet 3  . CORLANOR 7.5 MG TABS tablet TAKE 1 TABLET BY MOUTH 2 (TWO) TIMES DAILY WITH A MEAL (AM+BEDTIME) (Patient taking differently: Take 7.5 mg by mouth in the morning and at bedtime.) 60 tablet 3  . dapagliflozin propanediol (FARXIGA) 10 MG TABS tablet Take 10 mg by mouth  in the morning.    . digoxin (LANOXIN) 0.125 MG tablet TAKE 1 TABLET (0.125 MG TOTAL) BY MOUTH DAILY (NOON) (Patient taking differently: Take 0.125 mg by mouth daily at 12 noon.) 90 tablet 3  . Dulaglutide (TRULICITY) 1.5 ZO/1.0RU SOPN Inject 1.5 mg into the skin once a week.    Marland Kitchen glucose blood (ACCU-CHEK GUIDE) test strip Check blood sugars daily before breakfast. 100 each 12  . glucose blood (TRUE METRIX BLOOD GLUCOSE TEST) test strip Use as instructed 100 each 12  . ipratropium-albuterol (DUONEB) 0.5-2.5 (3) MG/3ML SOLN Take 3 mLs by nebulization every 4  (four) hours as needed. (Patient taking differently: Take 3 mLs by nebulization every 4 (four) hours as needed (sob).) 360 mL 0  . levonorgestrel (LILETTA, 52 MG,) 19.5 MCG/DAY IUD IUD 1 each by Intrauterine route once.    . Magnesium Oxide 400 (240 Mg) MG TABS Take 2 tablets by mouth every morning and 1 tablet by mouth every evening 90 tablet 6  . metolazone (ZAROXOLYN) 2.5 MG tablet As directed by HF clinic (Patient taking differently: Take 2.5 mg by mouth once a week. As directed by HF clinic) 10 tablet 3  . milrinone (PRIMACOR) 20 MG/100 ML SOLN infusion Inject 0.0414 mg/min into the vein continuous.    . potassium chloride (KLOR-CON) 10 MEQ tablet TAKE 8 TABS IN THE MORNING, 6 TABS IN THE AFTERNOON, AND 6 TABS IN THE EVENING (Patient taking differently: Take 60-80 mEq by mouth See admin instructions. Take 36mq in the morning, 632m in the afternoon, and 6066min the evening.) 600 tablet 11  . sacubitril-valsartan (ENTRESTO) 97-103 MG Take 1 tablet by mouth 2 (two) times daily. 60 tablet 6  . spironolactone (ALDACTONE) 25 MG tablet TAKE ONE TABLET BY MOUTH ONCE DAILY (NOON) (Patient taking differently: Take 25 mg by mouth daily at 12 noon.) 90 tablet 3  . torsemide (DEMADEX) 100 MG tablet TAKE ONE TABLET BY MOUTH TWICE DAILY (AM+NOON) (Patient taking differently: Take 100 mg by mouth 2 (two) times daily.) 60 tablet 11   No current facility-administered medications for this visit.    Allergies:   Metformin and Metformin and related   Social History: Social History   Socioeconomic History  . Marital status: Single    Spouse name: Not on file  . Number of children: 2  . Years of education: Not on file  . Highest education level: Some college, no degree  Occupational History  . Occupation: Disability  Tobacco Use  . Smoking status: Former Smoker    Types: Cigarettes    Quit date: 07/18/2016    Years since quitting: 4.5  . Smokeless tobacco: Never Used  . Tobacco comment: 3 cigarettes  per day  Vaping Use  . Vaping Use: Never used  Substance and Sexual Activity  . Alcohol use: Not Currently    Alcohol/week: 0.0 standard drinks    Comment: occasionally  . Drug use: Not Currently    Types: Marijuana    Comment: only socially  . Sexual activity: Yes    Birth control/protection: None  Other Topics Concern  . Not on file  Social History Narrative  . Not on file   Social Determinants of Health   Financial Resource Strain: Low Risk   . Difficulty of Paying Living Expenses: Not very hard  Food Insecurity: No Food Insecurity  . Worried About RunCharity fundraiser the Last Year: Never true  . Ran Out of Food in the Last Year: Never true  Transportation Needs: No Transportation Needs  . Lack of Transportation (Medical): No  . Lack of Transportation (Non-Medical): No  Physical Activity: Not on file  Stress: Not on file  Social Connections: Not on file  Intimate Partner Violence: Not on file    Family History: Family History  Problem Relation Age of Onset  . Heart attack Mother   . Asthma Mother   . Heart failure Mother   . Hypertension Father   . Diabetes Brother     Review of Systems: All other systems reviewed and are otherwise negative except as noted above.   Physical Exam: VS:  There were no vitals taken for this visit. , BMI There is no height or weight on file to calculate BMI.  GEN- The patient is well appearing, alert and oriented x 3 today.   HEENT: normocephalic, atraumatic; sclera clear, conjunctiva pink; hearing intact; oropharynx clear; neck supple  Lungs- Clear to ausculation bilaterally, normal work of breathing.  No wheezes, rales, rhonchi Heart- Regular rate and rhythm, no murmurs, rubs or gallops, PMI not laterally displaced GI- soft, non-tender, non-distended, bowel sounds present, no hepatosplenomegaly Extremities- no clubbing, cyanosis, or edema; DP/PT/radial pulses 2+ bilaterally MS- no significant deformity or atrophy Skin- warm  and dry, no rash or lesion; ICD pocket well healed Psych- euthymic mood, full affect Neuro- strength and sensation are intact  ICD interrogation- reviewed in detail today,  See PACEART report  EKG:  EKG is not ordered today.  Recent Labs: 08/24/2020: ALT 22 01/18/2021: Hemoglobin 13.6; Platelets 289 01/19/2021: BUN 12; Creatinine, Ser 1.05; Magnesium 1.4; Potassium 3.4; Sodium 135   Wt Readings from Last 3 Encounters:  01/19/21 (!) 363 lb 8 oz (164.9 kg)  01/17/21 (!) 362 lb (164.2 kg)  01/15/21 (!) 362 lb 12.8 oz (164.6 kg)     Other studies Reviewed: Additional studies/ records that were reviewed today include: AHF notes   Assessment and Plan:  1.  Chronic systolic dysfunction Followed by AHF clinic closely. She is on home milrinone. Not a candidate for LVAD or transplant per last AHF note. Will screen for Barostim   Stable on an appropriate medical regimen Normal ICD function See Pace Art report No changes today  2.  Obesity There is no height or weight on file to calculate BMI. She continues to work on weight loss      Current medicines are reviewed at length with the patient today.   The patient does not have concerns regarding her medicines.  The following changes were made today:  none  Labs/ tests ordered today include: none No orders of the defined types were placed in this encounter.    Disposition:   Follow up with [facility], Dr Curt Bears 1 year      Signed, Chanetta Marshall, NP 02/05/2021 3:14 PM  [address] [place] [address] [phone] (office) [phone] (fax)

## 2021-02-06 ENCOUNTER — Encounter: Payer: Medicaid Other | Admitting: Nurse Practitioner

## 2021-02-07 ENCOUNTER — Ambulatory Visit (HOSPITAL_COMMUNITY)
Admission: RE | Admit: 2021-02-07 | Discharge: 2021-02-07 | Disposition: A | Discharge status: Home / Self Care | Payer: Medicaid Other | Source: Ambulatory Visit | Attending: Cardiology | Admitting: Cardiology

## 2021-02-07 ENCOUNTER — Encounter (HOSPITAL_COMMUNITY): Payer: Self-pay

## 2021-02-07 ENCOUNTER — Other Ambulatory Visit: Payer: Self-pay

## 2021-02-07 ENCOUNTER — Other Ambulatory Visit (HOSPITAL_COMMUNITY): Payer: Self-pay

## 2021-02-07 ENCOUNTER — Ambulatory Visit (HOSPITAL_BASED_OUTPATIENT_CLINIC_OR_DEPARTMENT_OTHER)
Admission: RE | Admit: 2021-02-07 | Discharge: 2021-02-07 | Disposition: A | Discharge status: Home / Self Care | Payer: Medicaid Other | Source: Ambulatory Visit | Attending: Cardiology | Admitting: Cardiology

## 2021-02-07 VITALS — BP 132/86 | HR 97 | Wt 357.4 lb

## 2021-02-07 DIAGNOSIS — I5022 Chronic systolic (congestive) heart failure: Secondary | ICD-10-CM

## 2021-02-07 DIAGNOSIS — Z6841 Body Mass Index (BMI) 40.0 and over, adult: Secondary | ICD-10-CM | POA: Insufficient documentation

## 2021-02-07 DIAGNOSIS — I428 Other cardiomyopathies: Secondary | ICD-10-CM | POA: Diagnosis not present

## 2021-02-07 DIAGNOSIS — Z452 Encounter for adjustment and management of vascular access device: Secondary | ICD-10-CM | POA: Diagnosis not present

## 2021-02-07 DIAGNOSIS — I340 Nonrheumatic mitral (valve) insufficiency: Secondary | ICD-10-CM | POA: Diagnosis not present

## 2021-02-07 DIAGNOSIS — E119 Type 2 diabetes mellitus without complications: Secondary | ICD-10-CM | POA: Insufficient documentation

## 2021-02-07 DIAGNOSIS — Z87891 Personal history of nicotine dependence: Secondary | ICD-10-CM | POA: Diagnosis not present

## 2021-02-07 DIAGNOSIS — Z79899 Other long term (current) drug therapy: Secondary | ICD-10-CM | POA: Insufficient documentation

## 2021-02-07 DIAGNOSIS — I471 Supraventricular tachycardia: Secondary | ICD-10-CM | POA: Insufficient documentation

## 2021-02-07 DIAGNOSIS — Z8249 Family history of ischemic heart disease and other diseases of the circulatory system: Secondary | ICD-10-CM | POA: Insufficient documentation

## 2021-02-07 DIAGNOSIS — Z7984 Long term (current) use of oral hypoglycemic drugs: Secondary | ICD-10-CM | POA: Diagnosis not present

## 2021-02-07 DIAGNOSIS — I272 Pulmonary hypertension, unspecified: Secondary | ICD-10-CM | POA: Diagnosis not present

## 2021-02-07 DIAGNOSIS — Z793 Long term (current) use of hormonal contraceptives: Secondary | ICD-10-CM | POA: Diagnosis not present

## 2021-02-07 DIAGNOSIS — I5042 Chronic combined systolic (congestive) and diastolic (congestive) heart failure: Secondary | ICD-10-CM

## 2021-02-07 DIAGNOSIS — Z95810 Presence of automatic (implantable) cardiac defibrillator: Secondary | ICD-10-CM | POA: Insufficient documentation

## 2021-02-07 DIAGNOSIS — J45909 Unspecified asthma, uncomplicated: Secondary | ICD-10-CM | POA: Diagnosis not present

## 2021-02-07 DIAGNOSIS — O903 Peripartum cardiomyopathy: Secondary | ICD-10-CM | POA: Diagnosis not present

## 2021-02-07 DIAGNOSIS — F17210 Nicotine dependence, cigarettes, uncomplicated: Secondary | ICD-10-CM | POA: Diagnosis not present

## 2021-02-07 DIAGNOSIS — Z975 Presence of (intrauterine) contraceptive device: Secondary | ICD-10-CM | POA: Diagnosis not present

## 2021-02-07 DIAGNOSIS — G4733 Obstructive sleep apnea (adult) (pediatric): Secondary | ICD-10-CM | POA: Diagnosis not present

## 2021-02-07 DIAGNOSIS — I5023 Acute on chronic systolic (congestive) heart failure: Secondary | ICD-10-CM | POA: Diagnosis not present

## 2021-02-07 LAB — ECHOCARDIOGRAM COMPLETE
Area-P 1/2: 3.88 cm2
MV M vel: 3.91 m/s
MV Peak grad: 61.2 mmHg
Radius: 0.8 cm
S' Lateral: 6.8 cm

## 2021-02-07 NOTE — Progress Notes (Signed)
Paramedicine Encounter   [name] , female,   DOB: 18-Oct-1980,39 y.o.,  MRN: [id number]   Met patient in clinic today with provider.  Weight @ clinic-357 B/p-132/86 p-97 [id number]  Pt had echo today. Dr. Aundra Dubin will review [place].  Breathing is same. At last visit her entresto was increased to 97/103. She has always had dizziness, but a tad more than before but tolerable.  She is planning on a trip to [place] in June. She hopes her dad will have her a car by then.  Her milrinone is being turned down to see how she does-her goal is to come off of it if possible.   [name], [place] 02/07/2021

## 2021-02-07 NOTE — Patient Instructions (Signed)
We will contact your home health RN to reduce there dose of your Milrinone.  Labs to be drawn by home health We will only contact you if something comes back abnormal or we need to make some changes. Otherwise no news is good news!  Your physician recommends that you schedule a follow-up appointment in: Next appointment is scheduled for June 17th, 2022 at 10:30am. [place] code [id number]  Please call office at [phone] option 2 if you have any questions or concerns.   At the [facility], you and your health needs are our priority. As part of our continuing mission to provide you with exceptional heart care, we have created designated Provider Care Teams. These Care Teams include your primary Cardiologist (physician) and Advanced Practice Providers (APPs- Physician Assistants and Nurse Practitioners) who all work together to provide you with the care you need, when you need it.   You may see any of the following providers on your designated Care Team at your next follow up: Marland Kitchen Dr Arvilla Meres . Dr Marca Ancona . Dr Thornell Mule . Tonye Becket, NP . Robbie Lis, PA . Shanda Bumps [place],NP . Karle Plumber, PharmD   Please be sure to bring in all your medications bottles to every appointment.

## 2021-02-07 NOTE — Progress Notes (Signed)
  Echocardiogram 2D Echocardiogram has been performed.  Leta Jungling M 02/07/2021, 11:47 AM

## 2021-02-07 NOTE — Progress Notes (Signed)
[facility] Note   Referring Physician:PCP: Ladell Pier, MD PCP-Cardiologist: Dr. Ronney Lion GYN : Dr Norman Herrlich.   HPI: 40 y.o. 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   She presented to the ER on 06/17/19 with several days of worsening HF with SOB at rest, marked edema and orthopnea/PND, not responding to increased doses of oral diuretics. She admitted to high levels of water and Gatorade intake. Her admit wt was 380 lb (Dry wt ~360 lb). She was started on IV Lasix but had initial poor urinary response. PO metolazone added as adjunct. Echo showed EF <20%, relatively normal RV, moderate MR. [place] 06/21/19 with low output (CI 1.8) and elevated PCWP but near normal RA pressure. She was started on inotropic therapy w/ milrinone for low output, 0.25 mcg/kg/min. Also treated w/ Entresto, Coreg, spironolactone, digoxin and ivabradine. She was later transitioned off of IV Lasix to po torsemide 80 mg qam/60 mg qpm.  Unfortunately, she was not a transplant candidate due to her morbid obesity. We discussed her at [facility] with Dr. Prescott Gum. She will need significant weight loss to get to LVAD. Will use home milrinone to try to facilitate increased activity to work towards weight loss. She understands that milrinone is not a good end-point and that we would like to use it as a bridge to eventual LVAD. RV function seems adequate for LVAD. Tunneled catheter was placed for home milrinone and home health was arranged. She was discharged home on 06/25/19 on milrinone 0.25 mcg/kg/min.   06/27/19 she presented to the [facility] w/ CC of palpitations. Was brought in by EMS and was reportedly in SVT with HR in the 180s. She was given adenosine and converted to NSR prior to arrival to the ED.  PICC exchanged 10/15/19  She now has an IUD for contraception (had a terminated pregnancy over the winter).    01/17/21 went to ED for PICC line  displacement. IR consulted and tunneled PICC was replaced.  Now presents to [facility] for routine f/u. Had repeat echo done today, interpretation pending. She reports stable NYHA Class II-III symptoms. For the most part, she reports being compliant w/ medications, but admits that she may delay her morning dose of diuretics until later in the day if she is busy running errands. She was due to take metolazone today but has not yet taken. Fluid index on Optivol is up slightly but still well below threshold. No AT/AF or VT/VF on device interrogation. BP well controlled. Home health to visit today and draw labs. At last visit, w/ Dr. Aundra Dubin, they discussed weaning her off of milrinone and she is willing to try this today. Currenlty on 0.25 mcg/kg/min.   ICD interrogation (personally reviewed): Fluid index < threshold, no VT/VF. No AT/AF   ECG: not performed today   Labs (8/21): K 4.4, creatinine 0.9, hgb 14.4. Labs (06/13/20): K 4, creatinine 0.97  Labs (2/22): K 3.7, creatinine 0.76 Labs (3/22): K 4.4, creatinine 0.73, hgb 13.7 Labs (5/22): K 3.4, creatinine 1.05  PMH:  1. Chronic systolic CHF: Nonischemic cardiomyopathy.  No family history of cardiomyopathy, no heavy ETOH, cocaine, or amphetamines.  Medtronic ICD.  - Echo (12/16) with EF 20-25%, moderate MR.  - Echo (5/17) with severely dilated LV, EF 10-15%, severe functional MR, mildly decreased RV systolic function. - LHC/RHC (5/17): No significant coronary disease; mean RA 15, PA 61/25 mean 41, mean PCWP 18, CI  1.7 (Fick), PVR 5.4 WU.  - Echo (10/17): EF 20%, severely dilated LV, normal RV size with mildly decreased systolic function, moderate-severe central MR.  - Echo 04/13/17  LVEF 15-20%, Severe LV dilation, Mod LAE, Mild RAE - CPX (5/19): RER 0.95, peak VO2 9.8, VE/VCO2 26 => submaximal but likely severe functional impairement.  - Echo (7/19): EF 20-25%, moderate LV dilation, RV normal size with mildly decreased systolic function, moderate  central MR.  - Echo (10/20): EF < 20%, severe LV enlargement, normal RV>  - RHC (10/20): mean RA 7, PA 65/25, mean PCWP 27, CI 1.8, PVR 3.87, PAPI 5.7.  2. Morbid obesity.  3. 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  Echo (10/17) with moderate-severe MR. Echo (7/19) with moderate central MR.  4. OSA: CPAP.  5. SVT  Current Outpatient Medications  Medication Sig Dispense Refill  . Accu-Chek Softclix Lancets lancets Use as instructed 100 each 12  . atorvastatin (LIPITOR) 10 MG tablet TAKE 1 TABLET (10 MG TOTAL) BY MOUTH DAILY (NOON) (Patient taking differently: Take 10 mg by mouth daily at 12 noon.) 90 tablet 3  . Blood Glucose Monitoring Suppl (ACCU-CHEK GUIDE) w/Device KIT Use as directed 1 kit 0  . Blood Glucose Monitoring Suppl (TRUE METRIX METER) w/Device KIT Use as directed 1 kit 0  . Blood Pressure Monitoring (3 SERIES BP MONITOR/UPPER ARM) DEVI Check BP Daily 1 each 0  . carvedilol (COREG) 12.5 MG tablet TAKE ONE TABLET BY MOUTH TWICE DAILY (AM+BEDTIME) (Patient taking differently: Take 12.5 mg by mouth 2 (two) times daily with a meal.) 180 tablet 3  . CORLANOR 7.5 MG TABS tablet TAKE 1 TABLET BY MOUTH 2 (TWO) TIMES DAILY WITH A MEAL (AM+BEDTIME) (Patient taking differently: Take 7.5 mg by mouth in the morning and at bedtime.) 60 tablet 3  . dapagliflozin propanediol (FARXIGA) 10 MG TABS tablet Take 10 mg by mouth in the morning.    . digoxin (LANOXIN) 0.125 MG tablet TAKE 1 TABLET (0.125 MG TOTAL) BY MOUTH DAILY (NOON) (Patient taking differently: Take 0.125 mg by mouth daily at 12 noon.) 90 tablet 3  . Dulaglutide (TRULICITY) 1.5 EH/2.0NO SOPN Inject 1.5 mg into the skin once a week.    Marland Kitchen glucose blood (ACCU-CHEK GUIDE) test strip Check blood sugars daily before breakfast. 100 each 12  . glucose blood (TRUE METRIX BLOOD GLUCOSE TEST) test strip Use as instructed 100 each 12  . ipratropium-albuterol (DUONEB) 0.5-2.5 (3) MG/3ML SOLN Take 3 mLs by nebulization every 4 (four)  hours as needed. (Patient taking differently: Take 3 mLs by nebulization every 4 (four) hours as needed (sob).) 360 mL 0  . levonorgestrel (LILETTA, 52 MG,) 19.5 MCG/DAY IUD IUD 1 each by Intrauterine route once.    . Magnesium Oxide 400 (240 Mg) MG TABS Take 2 tablets by mouth every morning and 1 tablet by mouth every evening 90 tablet 6  . metolazone (ZAROXOLYN) 2.5 MG tablet As directed by HF clinic (Patient taking differently: Take 2.5 mg by mouth once a week. As directed by HF clinic) 10 tablet 3  . milrinone (PRIMACOR) 20 MG/100 ML SOLN infusion Inject 0.0414 mg/min into the vein continuous.    . potassium chloride (KLOR-CON) 10 MEQ tablet TAKE 8 TABS IN THE MORNING, 6 TABS IN THE AFTERNOON, AND 6 TABS IN THE EVENING (Patient taking differently: Take 60-80 mEq by mouth See admin instructions. Take 88mq in the morning, 632m in the afternoon, and 6054min the evening.) 600 tablet 11  . sacubitril-valsartan (  ENTRESTO) 97-103 MG Take 1 tablet by mouth 2 (two) times daily. 60 tablet 6  . spironolactone (ALDACTONE) 25 MG tablet TAKE ONE TABLET BY MOUTH ONCE DAILY (NOON) (Patient taking differently: Take 25 mg by mouth daily at 12 noon.) 90 tablet 3  . torsemide (DEMADEX) 100 MG tablet TAKE ONE TABLET BY MOUTH TWICE DAILY (AM+NOON) (Patient taking differently: Take 100 mg by mouth 2 (two) times daily.) 60 tablet 11   No current facility-administered medications for this encounter.    Allergies  Allergen Reactions  . Metformin Diarrhea    And caused severe "dry mouth," also  . Metformin And Related Diarrhea    And caused severe "dry mouth," also      Social History   Socioeconomic History  . Marital status: Single    Spouse name: Not on file  . Number of children: 2  . Years of education: Not on file  . Highest education level: Some college, no degree  Occupational History  . Occupation: Disability  Tobacco Use  . Smoking status: Former Smoker    Types: Cigarettes    Quit date:  07/18/2016    Years since quitting: 4.5  . Smokeless tobacco: Never Used  . Tobacco comment: 3 cigarettes per day  Vaping Use  . Vaping Use: Never used  Substance and Sexual Activity  . Alcohol use: Not Currently    Alcohol/week: 0.0 standard drinks    Comment: occasionally  . Drug use: Not Currently    Types: Marijuana    Comment: only socially  . Sexual activity: Yes    Birth control/protection: None  Other Topics Concern  . Not on file  Social History Narrative  . Not on file   Social Determinants of Health   Financial Resource Strain: Low Risk   . Difficulty of Paying Living Expenses: Not very hard  Food Insecurity: No Food Insecurity  . Worried About Charity fundraiser in the Last Year: Never true  . Ran Out of Food in the Last Year: Never true  Transportation Needs: No Transportation Needs  . Lack of Transportation (Medical): No  . Lack of Transportation (Non-Medical): No  Physical Activity: Not on file  Stress: Not on file  Social Connections: Not on file  Intimate Partner Violence: Not on file     Family History  Problem Relation Age of Onset  . Heart attack Mother   . Asthma Mother   . Heart failure Mother   . Hypertension Father   . Diabetes Brother     Vitals:   02/07/21 1158  BP: 132/86  Pulse: 97  SpO2: 97%  Weight: (!) 162.1 kg (357 lb 6.4 oz)   Wt Readings from Last 3 Encounters:  02/07/21 (!) 162.1 kg (357 lb 6.4 oz)  01/19/21 (!) 164.9 kg (363 lb 8 oz)  01/17/21 (!) 164.2 kg (362 lb)   PHYSICAL EXAM: General:  Well appearing, super morbidly obese. No respiratory difficulty HEENT: normal Neck: supple. Thick neck, JVD not well visualized. Carotids 2+ bilat; no bruits. No lymphadenopathy or thyromegaly appreciated. Cor: PMI nondisplaced. Regular rate & rhythm. No rubs, gallops or murmurs. Lungs: clear Abdomen: obese soft, nontender, nondistended. No hepatosplenomegaly. No bruits or masses. Good bowel sounds. Extremities: no cyanosis,  clubbing, rash, no pitting edema Neuro: alert & oriented x 3, cranial nerves grossly intact. moves all 4 extremities w/o difficulty. Affect pleasant.   ASSESSMENT & PLAN: 1. Chronic systolic CHF:  Nonischemic cardiomyopathy.  Body habitus precludes cardiac MRI.  Echo  06/2019 with EF < 20%, relatively normal RV, moderate MR.  [place] 06/21/19 with low output (CI 1.8) and elevated PCWP but near normal RA pressure, started on milrinone 0.25 with plans to continue home milrinone as bridge to possible LVAD if she could achieve significant weight loss.  Unfortunately, she has not succeeded in losing much wt. She is down 6 lb from previous visit.  She remains NYHA class II. She is not volume overloaded by exam or Optivol. No VT/VF on device interrogation.  Echo repeated today, interpretation pending.  - Continue torsemide 100 mg bid + metolazone once weekly qWed. Discussed importance of strict compliance  - Continue Coreg 12.5 mg bid.  - Continue Entresto 97/103 bid  - Continue ivabradine to 7.5 mg bid.  - Continue spironolactone 25 mg daily.  - Continue digoxin 0.125 mg. Check digoxin level.  - Continue Farxiga 10 mg daily.  - BMP today  - She has been on milrinone for 1.5 years, really not making progress in weight loss so still not a candidate for transplant or LVAD.  D/w Dr. Aundra Dubin, will try decreasing milrinone to 0.125 for slow titration off.  - Discussed that she needs to prevent pregnancy. She now has an IUD now. 2. Morbid obesity:  Body mass index is 59.47 kg/m. - Turned down for bariatric surgery due to insurance.  - Discussed portion control.  - Continue [employer] PREP class.  3. SVT: had ED visit 10/11 for SVT noted by EMS that converted back to NSR after dose of adenosine. This was in the setting of hypokalemia and hypomagnesemia, at 3.0 and 1.5 respectively. Also in the setting of inotropic therapy w/ milrinone.  No AT/AF on device interrogation today 4. OSA: sleep study has been ordered  5.  DMII: On Farxiga + Trulicity.   Decrease milrinone to 0.125 mcg/kg/min. Home health will continue checking weekly labs. She will notify the office if highly symptomatic w/ milrinone wean. F/u in 3-4 weeks.   Lyda Jester, PA-C 02/07/21

## 2021-02-09 NOTE — Progress Notes (Signed)
Remote ICD transmission.   

## 2021-02-13 ENCOUNTER — Telehealth (HOSPITAL_COMMUNITY): Payer: Self-pay

## 2021-02-13 NOTE — Telephone Encounter (Signed)
I spoke to pt and when the nurse seen her last week she reported not getting any orders to decrease the milrinone yet. Pt will talk to her today and pt will keep me updated and if that order needs to be sent in again.  She is suppose to go to [place] Friday and come back Monday for a short trip to see her sister. She did state that if the nurse got the order to turn it down this week and if she felt bad Friday she would not go out of town but she is going to keep me updated on that as well as to how she is tolerating it.   Kerry Hough, EMT-Paramedic  02/13/21

## 2021-02-14 ENCOUNTER — Other Ambulatory Visit (HOSPITAL_COMMUNITY): Payer: Self-pay | Admitting: Cardiology

## 2021-02-14 DIAGNOSIS — I5023 Acute on chronic systolic (congestive) heart failure: Secondary | ICD-10-CM | POA: Diagnosis not present

## 2021-02-14 DIAGNOSIS — G4733 Obstructive sleep apnea (adult) (pediatric): Secondary | ICD-10-CM | POA: Diagnosis not present

## 2021-02-14 DIAGNOSIS — Z79899 Other long term (current) drug therapy: Secondary | ICD-10-CM | POA: Diagnosis not present

## 2021-02-14 DIAGNOSIS — I272 Pulmonary hypertension, unspecified: Secondary | ICD-10-CM | POA: Diagnosis not present

## 2021-02-14 DIAGNOSIS — F17210 Nicotine dependence, cigarettes, uncomplicated: Secondary | ICD-10-CM | POA: Diagnosis not present

## 2021-02-14 DIAGNOSIS — J45909 Unspecified asthma, uncomplicated: Secondary | ICD-10-CM | POA: Diagnosis not present

## 2021-02-14 DIAGNOSIS — Z7984 Long term (current) use of oral hypoglycemic drugs: Secondary | ICD-10-CM | POA: Diagnosis not present

## 2021-02-14 DIAGNOSIS — Z452 Encounter for adjustment and management of vascular access device: Secondary | ICD-10-CM | POA: Diagnosis not present

## 2021-02-14 DIAGNOSIS — Z6841 Body Mass Index (BMI) 40.0 and over, adult: Secondary | ICD-10-CM | POA: Diagnosis not present

## 2021-02-14 DIAGNOSIS — E119 Type 2 diabetes mellitus without complications: Secondary | ICD-10-CM | POA: Diagnosis not present

## 2021-02-14 DIAGNOSIS — I340 Nonrheumatic mitral (valve) insufficiency: Secondary | ICD-10-CM | POA: Diagnosis not present

## 2021-02-14 DIAGNOSIS — I5022 Chronic systolic (congestive) heart failure: Secondary | ICD-10-CM | POA: Diagnosis not present

## 2021-02-14 DIAGNOSIS — O903 Peripartum cardiomyopathy: Secondary | ICD-10-CM | POA: Diagnosis not present

## 2021-02-19 ENCOUNTER — Telehealth (HOSPITAL_COMMUNITY): Payer: Self-pay

## 2021-02-21 DIAGNOSIS — J45909 Unspecified asthma, uncomplicated: Secondary | ICD-10-CM | POA: Diagnosis not present

## 2021-02-21 DIAGNOSIS — Z452 Encounter for adjustment and management of vascular access device: Secondary | ICD-10-CM | POA: Diagnosis not present

## 2021-02-21 DIAGNOSIS — E119 Type 2 diabetes mellitus without complications: Secondary | ICD-10-CM | POA: Diagnosis not present

## 2021-02-21 DIAGNOSIS — I272 Pulmonary hypertension, unspecified: Secondary | ICD-10-CM | POA: Diagnosis not present

## 2021-02-21 DIAGNOSIS — Z7984 Long term (current) use of oral hypoglycemic drugs: Secondary | ICD-10-CM | POA: Diagnosis not present

## 2021-02-21 DIAGNOSIS — I5022 Chronic systolic (congestive) heart failure: Secondary | ICD-10-CM | POA: Diagnosis not present

## 2021-02-21 DIAGNOSIS — G4733 Obstructive sleep apnea (adult) (pediatric): Secondary | ICD-10-CM | POA: Diagnosis not present

## 2021-02-21 DIAGNOSIS — I5023 Acute on chronic systolic (congestive) heart failure: Secondary | ICD-10-CM | POA: Diagnosis not present

## 2021-02-21 DIAGNOSIS — Z6841 Body Mass Index (BMI) 40.0 and over, adult: Secondary | ICD-10-CM | POA: Diagnosis not present

## 2021-02-21 DIAGNOSIS — F17210 Nicotine dependence, cigarettes, uncomplicated: Secondary | ICD-10-CM | POA: Diagnosis not present

## 2021-02-21 DIAGNOSIS — Z794 Long term (current) use of insulin: Secondary | ICD-10-CM | POA: Diagnosis not present

## 2021-02-21 DIAGNOSIS — O903 Peripartum cardiomyopathy: Secondary | ICD-10-CM | POA: Diagnosis not present

## 2021-02-21 DIAGNOSIS — I340 Nonrheumatic mitral (valve) insufficiency: Secondary | ICD-10-CM | POA: Diagnosis not present

## 2021-02-22 ENCOUNTER — Telehealth (HOSPITAL_COMMUNITY): Payer: Self-pay | Admitting: Cardiology

## 2021-02-22 MED ORDER — MAGNESIUM OXIDE -MG SUPPLEMENT 400 (240 MG) MG PO TABS: 400.0000 mg | ORAL_TABLET | Freq: Two times a day (BID) | ORAL | 6 refills | Status: DC

## 2021-02-22 NOTE — Telephone Encounter (Signed)
Abnormal labs received Labs drawn 02/08/21 Mg 1.3  Per [name] Clegg,NP Increase mag ox to 400 mg twice a day  Pt aware and voiced understanding

## 2021-02-26 ENCOUNTER — Telehealth (HOSPITAL_COMMUNITY): Payer: Self-pay

## 2021-02-27 NOTE — Telephone Encounter (Signed)
Attempted to reach pt for visit this week, no answer.  Will try again.   Kerry Hough, EMT-Paramedic 02/27/21

## 2021-02-28 DIAGNOSIS — F17210 Nicotine dependence, cigarettes, uncomplicated: Secondary | ICD-10-CM | POA: Diagnosis not present

## 2021-02-28 DIAGNOSIS — Z6841 Body Mass Index (BMI) 40.0 and over, adult: Secondary | ICD-10-CM | POA: Diagnosis not present

## 2021-02-28 DIAGNOSIS — I272 Pulmonary hypertension, unspecified: Secondary | ICD-10-CM | POA: Diagnosis not present

## 2021-02-28 DIAGNOSIS — Z452 Encounter for adjustment and management of vascular access device: Secondary | ICD-10-CM | POA: Diagnosis not present

## 2021-02-28 DIAGNOSIS — E119 Type 2 diabetes mellitus without complications: Secondary | ICD-10-CM | POA: Diagnosis not present

## 2021-02-28 DIAGNOSIS — I340 Nonrheumatic mitral (valve) insufficiency: Secondary | ICD-10-CM | POA: Diagnosis not present

## 2021-02-28 DIAGNOSIS — J45909 Unspecified asthma, uncomplicated: Secondary | ICD-10-CM | POA: Diagnosis not present

## 2021-02-28 DIAGNOSIS — O903 Peripartum cardiomyopathy: Secondary | ICD-10-CM | POA: Diagnosis not present

## 2021-02-28 DIAGNOSIS — G4733 Obstructive sleep apnea (adult) (pediatric): Secondary | ICD-10-CM | POA: Diagnosis not present

## 2021-02-28 DIAGNOSIS — I5023 Acute on chronic systolic (congestive) heart failure: Secondary | ICD-10-CM | POA: Diagnosis not present

## 2021-02-28 DIAGNOSIS — Z794 Long term (current) use of insulin: Secondary | ICD-10-CM | POA: Diagnosis not present

## 2021-02-28 DIAGNOSIS — Z7984 Long term (current) use of oral hypoglycemic drugs: Secondary | ICD-10-CM | POA: Diagnosis not present

## 2021-02-28 DIAGNOSIS — I5022 Chronic systolic (congestive) heart failure: Secondary | ICD-10-CM | POA: Diagnosis not present

## 2021-03-01 ENCOUNTER — Telehealth (HOSPITAL_COMMUNITY): Payer: Self-pay

## 2021-03-01 NOTE — Telephone Encounter (Signed)
Called pt today, no answer. I also have texted her twice this week and no answer.  I was at pharmacy picking up for another patient and pharmacist knows I work with her and advised he is unable to fill her trulicity, her bubble packs are ready for pick up, but she needs to f/u with PCP to get the trulicity filled.  It looks like she has appointment with PCP on 6/28.   Kerry Hough, EMT-Paramedic  03/01/21

## 2021-03-01 NOTE — Telephone Encounter (Signed)
Attempted to contact pt to sch visit this week. No answer.   Kerry Hough, EMT-Paramedic  02/26/2021

## 2021-03-02 ENCOUNTER — Encounter (HOSPITAL_COMMUNITY): Payer: Medicaid Other

## 2021-03-07 ENCOUNTER — Other Ambulatory Visit (HOSPITAL_COMMUNITY): Payer: Self-pay

## 2021-03-07 DIAGNOSIS — Z794 Long term (current) use of insulin: Secondary | ICD-10-CM | POA: Diagnosis not present

## 2021-03-07 DIAGNOSIS — Z7984 Long term (current) use of oral hypoglycemic drugs: Secondary | ICD-10-CM | POA: Diagnosis not present

## 2021-03-07 DIAGNOSIS — O903 Peripartum cardiomyopathy: Secondary | ICD-10-CM | POA: Diagnosis not present

## 2021-03-07 DIAGNOSIS — I5022 Chronic systolic (congestive) heart failure: Secondary | ICD-10-CM | POA: Diagnosis not present

## 2021-03-07 DIAGNOSIS — F17210 Nicotine dependence, cigarettes, uncomplicated: Secondary | ICD-10-CM | POA: Diagnosis not present

## 2021-03-07 DIAGNOSIS — Z452 Encounter for adjustment and management of vascular access device: Secondary | ICD-10-CM | POA: Diagnosis not present

## 2021-03-07 DIAGNOSIS — E119 Type 2 diabetes mellitus without complications: Secondary | ICD-10-CM | POA: Diagnosis not present

## 2021-03-07 DIAGNOSIS — G4733 Obstructive sleep apnea (adult) (pediatric): Secondary | ICD-10-CM | POA: Diagnosis not present

## 2021-03-07 DIAGNOSIS — I340 Nonrheumatic mitral (valve) insufficiency: Secondary | ICD-10-CM | POA: Diagnosis not present

## 2021-03-07 DIAGNOSIS — I5023 Acute on chronic systolic (congestive) heart failure: Secondary | ICD-10-CM | POA: Diagnosis not present

## 2021-03-07 DIAGNOSIS — J45909 Unspecified asthma, uncomplicated: Secondary | ICD-10-CM | POA: Diagnosis not present

## 2021-03-07 DIAGNOSIS — I272 Pulmonary hypertension, unspecified: Secondary | ICD-10-CM | POA: Diagnosis not present

## 2021-03-07 DIAGNOSIS — Z6841 Body Mass Index (BMI) 40.0 and over, adult: Secondary | ICD-10-CM | POA: Diagnosis not present

## 2021-03-07 NOTE — Progress Notes (Signed)
Paramedicine Encounter    [name], female    DOB: 10-04-1980, 40 y.o.   MRN: [id number]   Patient Care Team: Ladell Pier, MD as PCP - General (Internal Medicine) Constance Haw, MD as PCP - Electrophysiology (Cardiology) Larey Dresser, MD as PCP - Advanced Heart Failure (Cardiology) Jorge Ny, LCSW as Social Worker (Licensed Clinical Social Worker)  Patient Active Problem List   Diagnosis Date Noted   Heart failure ([place]) 01/18/2021   IUD (intrauterine device) in place 10/25/2020   Influenza vaccine refused 08/24/2020   Anxiety and depression 08/24/2020   23-polyvalent pneumococcal polysaccharide vaccine declined 08/24/2020   Tobacco dependence 08/24/2020   Acute on chronic systolic CHF (congestive heart failure) ([place]) 06/17/2019   Obstructive sleep apnea 06/17/2019   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   Diabetes mellitus ([place]) 06/17/2019   Amenorrhea 08/03/2018   Congestive heart failure ([place]) 06/29/2018   Hypotension 06/07/2018   Rhinovirus infection 06/06/2018   Asthma 06/05/2018   Hypokalemia 06/05/2018   Snoring 09/23/2016   Cough 04/10/2016   NICM (nonischemic cardiomyopathy) ([place]) 01/25/2016   Tobacco abuse 01/25/2016   Acute on chronic combined systolic and diastolic CHF (congestive heart failure) ([place]) 01/25/2016   Mitral regurgitation    Abnormal EKG-inf TWI 01/15/2016   Positive D dimer-CTA neg for PE 01/15/2016   Morbid obesity- BMI 57 01/14/2016   Abdominal pain 01/14/2016   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   At risk for sleep apnea 01/14/2016   Cardiomyopathy ([place]) 11/01/2015   Morbid obesity with BMI of 50.0-59.9, adult ([place]) 08/16/2015    Current Outpatient Medications:    Accu-Chek Softclix Lancets lancets, Use as instructed, Disp: 100 each, Rfl: 12   atorvastatin (LIPITOR) 10 MG tablet, TAKE 1 TABLET (10 MG TOTAL) BY MOUTH DAILY (NOON) (Patient taking differently: Take 10 mg by mouth  daily at 12 noon.), Disp: 90 tablet, Rfl: 3   Blood Glucose Monitoring Suppl (ACCU-CHEK GUIDE) w/Device KIT, Use as directed, Disp: 1 kit, Rfl: 0   Blood Glucose Monitoring Suppl (TRUE METRIX METER) w/Device KIT, Use as directed, Disp: 1 kit, Rfl: 0   Blood Pressure Monitoring (3 SERIES BP MONITOR/UPPER ARM) DEVI, Check BP Daily, Disp: 1 each, Rfl: 0   carvedilol (COREG) 12.5 MG tablet, TAKE ONE TABLET BY MOUTH TWICE DAILY (AM+BEDTIME) (Patient taking differently: Take 12.5 mg by mouth 2 (two) times daily with a meal.), Disp: 180 tablet, Rfl: 3   CORLANOR 7.5 MG TABS tablet, TAKE 1 TABLET BY MOUTH 2 (TWO) TIMES DAILY WITH A MEAL (AM+BEDTIME) (Patient taking differently: Take 7.5 mg by mouth in the morning and at bedtime.), Disp: 60 tablet, Rfl: 3   dapagliflozin propanediol (FARXIGA) 10 MG TABS tablet, Take 10 mg by mouth in the morning., Disp: , Rfl:    digoxin (LANOXIN) 0.125 MG tablet, TAKE 1 TABLET (0.125 MG TOTAL) BY MOUTH DAILY (NOON) (Patient taking differently: Take 0.125 mg by mouth daily at 12 noon.), Disp: 90 tablet, Rfl: 3   Dulaglutide (TRULICITY) 1.5 KV/4.2VZ SOPN, Inject 1.5 mg into the skin once a week., Disp: , Rfl:    glucose blood (ACCU-CHEK GUIDE) test strip, Check blood sugars daily before breakfast., Disp: 100 each, Rfl: 12   glucose blood (TRUE METRIX BLOOD GLUCOSE TEST) test strip, Use as instructed, Disp: 100 each, Rfl: 12   ipratropium-albuterol (DUONEB) 0.5-2.5 (3) MG/3ML SOLN, Take 3 mLs by nebulization every 4 (four) hours as needed., Disp: 360 mL, Rfl: 0   levonorgestrel (LILETTA, 52  MG,) 19.5 MCG/DAY IUD IUD, 1 each by Intrauterine route once., Disp: , Rfl:    magnesium oxide (MAG-OX) 400 (240 Mg) MG tablet, Take 1 tablet (400 mg total) by mouth 2 (two) times daily., Disp: 90 tablet, Rfl: 6   metolazone (ZAROXOLYN) 2.5 MG tablet, As directed by HF clinic (Patient taking differently: Take 2.5 mg by mouth once a week. As directed by HF clinic), Disp: 10 tablet, Rfl: 3    milrinone (PRIMACOR) 20 MG/100 ML SOLN infusion, Inject 0.0414 mg/min into the vein continuous., Disp:  , Rfl:    potassium chloride (KLOR-CON) 10 MEQ tablet, TAKE 8 TABS IN THE MORNING, 6 TABS IN THE AFTERNOON, AND 6 TABS IN THE EVENING (Patient taking differently: Take 60-80 mEq by mouth See admin instructions. Take 48mq in the morning, 664m in the afternoon, and 6080min the evening.), Disp: 600 tablet, Rfl: 11   sacubitril-valsartan (ENTRESTO) 97-103 MG, Take 1 tablet by mouth 2 (two) times daily., Disp: 60 tablet, Rfl: 6   spironolactone (ALDACTONE) 25 MG tablet, TAKE ONE TABLET BY MOUTH ONCE DAILY (NOON) (Patient taking differently: Take 25 mg by mouth daily at 12 noon.), Disp: 90 tablet, Rfl: 3   torsemide (DEMADEX) 100 MG tablet, TAKE ONE TABLET BY MOUTH TWICE DAILY (AM+NOON) (Patient taking differently: Take 100 mg by mouth 2 (two) times daily.), Disp: 60 tablet, Rfl: 11 Allergies  Allergen Reactions   Metformin Diarrhea    And caused severe "dry mouth," also   Metformin And Related Diarrhea    And caused severe "dry mouth," also      Social History   Socioeconomic History   Marital status: Single    Spouse name: Not on file   Number of children: 2   Years of education: Not on file   Highest education level: Some college, no degree  Occupational History   Occupation: Disability  Tobacco Use   Smoking status: Former    Pack years: 0.00    Types: Cigarettes    Quit date: 07/18/2016    Years since quitting: 4.6   Smokeless tobacco: Never   Tobacco comments:    3 cigarettes per day  Vaping Use   Vaping Use: Never used  Substance and Sexual Activity   Alcohol use: Not Currently    Alcohol/week: 0.0 standard drinks    Comment: occasionally   Drug use: Not Currently    Types: Marijuana    Comment: only socially   Sexual activity: Yes    Birth control/protection: None  Other Topics Concern   Not on file  Social History Narrative   Not on file   Social Determinants  of Health   Financial Resource Strain: Low Risk    Difficulty of Paying Living Expenses: Not very hard  Food Insecurity: No Food Insecurity   Worried About [employer] fundraiser the Last Year: Never true   Ran Out of Food in the Last Year: Never true  Transportation Needs: No Transportation Needs   Lack of Transportation (Medical): No   Lack of Transportation (Non-Medical): No  Physical Activity: Not on file  Stress: Not on file  Social Connections: Not on file  Intimate Partner Violence: Not on file    Physical Exam      Future Appointments  Date Time Provider DepLewis/28/2022  1:30 PM JohLadell PierD [facility] None  03/27/2021  8:00 PM TurSueanne MargaritaD [facility]  04/23/2021  8:35 AM [facility] DEVICE REMOTES [facility]  07/23/2021  8:35 AM [facility] DEVICE REMOTES [facility]  10/22/2021  8:35 AM [facility] DEVICE REMOTES [facility]  01/21/2022  8:35 AM [facility] DEVICE REMOTES [facility]    BP (!) 94/0   Pulse 80   Resp 20   SpO2 98%  CBG's-140-160 Weight yesterday-352 Last visit weight-357 @ clinic  Pt reports she has been feeling ok. She had a few days after the milrinone was decreased where she felt bad, she had h/a for a few days, felt really heavy/tired but that subsided.  She missed her clinic appoint last Friday. Reminded her to call to resch that.  Her weight has come down some.  She reports her breathing is doing good. She denies c/p, no dizziness.  Not doing good with appetite. She has to force herself to eat especially when taking her pills. If not she will vomit.  Her mag was increased to 482m BID.  She reports doing better taking her full dose of potassium.  She has bubble packs.  Reminded her of her PCP appoint for next week.  Also her sleep study for next month.  Will see her in a couple wks.   KMarylouise Stacks [facility]om[facility]amedic   03/07/21

## 2021-03-08 ENCOUNTER — Other Ambulatory Visit (HOSPITAL_COMMUNITY): Payer: Self-pay | Admitting: Cardiology

## 2021-03-09 ENCOUNTER — Other Ambulatory Visit (HOSPITAL_COMMUNITY): Payer: Self-pay | Admitting: Cardiology

## 2021-03-13 ENCOUNTER — Ambulatory Visit: Payer: Medicaid Other | Admitting: Internal Medicine

## 2021-03-13 DIAGNOSIS — F17210 Nicotine dependence, cigarettes, uncomplicated: Secondary | ICD-10-CM | POA: Diagnosis not present

## 2021-03-13 DIAGNOSIS — Z452 Encounter for adjustment and management of vascular access device: Secondary | ICD-10-CM | POA: Diagnosis not present

## 2021-03-13 DIAGNOSIS — I340 Nonrheumatic mitral (valve) insufficiency: Secondary | ICD-10-CM | POA: Diagnosis not present

## 2021-03-13 DIAGNOSIS — I5022 Chronic systolic (congestive) heart failure: Secondary | ICD-10-CM | POA: Diagnosis not present

## 2021-03-13 DIAGNOSIS — O903 Peripartum cardiomyopathy: Secondary | ICD-10-CM | POA: Diagnosis not present

## 2021-03-13 DIAGNOSIS — I272 Pulmonary hypertension, unspecified: Secondary | ICD-10-CM | POA: Diagnosis not present

## 2021-03-13 DIAGNOSIS — Z794 Long term (current) use of insulin: Secondary | ICD-10-CM | POA: Diagnosis not present

## 2021-03-13 DIAGNOSIS — G4733 Obstructive sleep apnea (adult) (pediatric): Secondary | ICD-10-CM | POA: Diagnosis not present

## 2021-03-13 DIAGNOSIS — E119 Type 2 diabetes mellitus without complications: Secondary | ICD-10-CM | POA: Diagnosis not present

## 2021-03-13 DIAGNOSIS — Z6841 Body Mass Index (BMI) 40.0 and over, adult: Secondary | ICD-10-CM | POA: Diagnosis not present

## 2021-03-13 DIAGNOSIS — Z7984 Long term (current) use of oral hypoglycemic drugs: Secondary | ICD-10-CM | POA: Diagnosis not present

## 2021-03-13 DIAGNOSIS — J45909 Unspecified asthma, uncomplicated: Secondary | ICD-10-CM | POA: Diagnosis not present

## 2021-03-14 ENCOUNTER — Other Ambulatory Visit (HOSPITAL_COMMUNITY): Payer: Self-pay | Admitting: Cardiology

## 2021-03-14 DIAGNOSIS — E119 Type 2 diabetes mellitus without complications: Secondary | ICD-10-CM | POA: Diagnosis not present

## 2021-03-14 DIAGNOSIS — Z452 Encounter for adjustment and management of vascular access device: Secondary | ICD-10-CM | POA: Diagnosis not present

## 2021-03-14 DIAGNOSIS — O903 Peripartum cardiomyopathy: Secondary | ICD-10-CM | POA: Diagnosis not present

## 2021-03-14 DIAGNOSIS — F17210 Nicotine dependence, cigarettes, uncomplicated: Secondary | ICD-10-CM | POA: Diagnosis not present

## 2021-03-14 DIAGNOSIS — I5022 Chronic systolic (congestive) heart failure: Secondary | ICD-10-CM | POA: Diagnosis not present

## 2021-03-14 DIAGNOSIS — Z7984 Long term (current) use of oral hypoglycemic drugs: Secondary | ICD-10-CM | POA: Diagnosis not present

## 2021-03-14 DIAGNOSIS — J45909 Unspecified asthma, uncomplicated: Secondary | ICD-10-CM | POA: Diagnosis not present

## 2021-03-14 DIAGNOSIS — I272 Pulmonary hypertension, unspecified: Secondary | ICD-10-CM | POA: Diagnosis not present

## 2021-03-14 DIAGNOSIS — Z794 Long term (current) use of insulin: Secondary | ICD-10-CM | POA: Diagnosis not present

## 2021-03-14 DIAGNOSIS — I340 Nonrheumatic mitral (valve) insufficiency: Secondary | ICD-10-CM | POA: Diagnosis not present

## 2021-03-14 DIAGNOSIS — Z6841 Body Mass Index (BMI) 40.0 and over, adult: Secondary | ICD-10-CM | POA: Diagnosis not present

## 2021-03-14 DIAGNOSIS — I5023 Acute on chronic systolic (congestive) heart failure: Secondary | ICD-10-CM | POA: Diagnosis not present

## 2021-03-14 DIAGNOSIS — G4733 Obstructive sleep apnea (adult) (pediatric): Secondary | ICD-10-CM | POA: Diagnosis not present

## 2021-03-21 DIAGNOSIS — G4733 Obstructive sleep apnea (adult) (pediatric): Secondary | ICD-10-CM | POA: Diagnosis not present

## 2021-03-21 DIAGNOSIS — I5023 Acute on chronic systolic (congestive) heart failure: Secondary | ICD-10-CM | POA: Diagnosis not present

## 2021-03-21 DIAGNOSIS — Z794 Long term (current) use of insulin: Secondary | ICD-10-CM | POA: Diagnosis not present

## 2021-03-21 DIAGNOSIS — I5022 Chronic systolic (congestive) heart failure: Secondary | ICD-10-CM | POA: Diagnosis not present

## 2021-03-21 DIAGNOSIS — F17210 Nicotine dependence, cigarettes, uncomplicated: Secondary | ICD-10-CM | POA: Diagnosis not present

## 2021-03-21 DIAGNOSIS — I340 Nonrheumatic mitral (valve) insufficiency: Secondary | ICD-10-CM | POA: Diagnosis not present

## 2021-03-21 DIAGNOSIS — Z6841 Body Mass Index (BMI) 40.0 and over, adult: Secondary | ICD-10-CM | POA: Diagnosis not present

## 2021-03-21 DIAGNOSIS — Z452 Encounter for adjustment and management of vascular access device: Secondary | ICD-10-CM | POA: Diagnosis not present

## 2021-03-21 DIAGNOSIS — O903 Peripartum cardiomyopathy: Secondary | ICD-10-CM | POA: Diagnosis not present

## 2021-03-21 DIAGNOSIS — E119 Type 2 diabetes mellitus without complications: Secondary | ICD-10-CM | POA: Diagnosis not present

## 2021-03-21 DIAGNOSIS — J45909 Unspecified asthma, uncomplicated: Secondary | ICD-10-CM | POA: Diagnosis not present

## 2021-03-21 DIAGNOSIS — Z7984 Long term (current) use of oral hypoglycemic drugs: Secondary | ICD-10-CM | POA: Diagnosis not present

## 2021-03-21 DIAGNOSIS — I272 Pulmonary hypertension, unspecified: Secondary | ICD-10-CM | POA: Diagnosis not present

## 2021-03-22 ENCOUNTER — Telehealth (HOSPITAL_COMMUNITY): Payer: Self-pay

## 2021-03-22 NOTE — Telephone Encounter (Signed)
Reached out to pt and f/u on her missed PCP appointment-she reports that she is very overwhelmed right now and is very stressed out, but when I asked about what to see if there is anything I can do to help, she states that its everything.  I asked her if there was anything she needed that I could help with and she denied.   Will f/u next week.   Kerry Hough, EMT-Paramedic 03/22/21

## 2021-03-27 ENCOUNTER — Ambulatory Visit (HOSPITAL_BASED_OUTPATIENT_CLINIC_OR_DEPARTMENT_OTHER): Payer: Medicaid Other | Attending: Family Medicine | Admitting: Cardiology

## 2021-03-27 ENCOUNTER — Other Ambulatory Visit (HOSPITAL_COMMUNITY): Payer: Self-pay

## 2021-03-27 NOTE — Progress Notes (Signed)
Paramedicine Encounter    [name], female    DOB: 29-Jan-1981, 40 y.o.   MRN: [id number]   Patient Care Team: Ladell Pier, MD as PCP - General (Internal Medicine) Constance Haw, MD as PCP - Electrophysiology (Cardiology) Larey Dresser, MD as PCP - Advanced Heart Failure (Cardiology) Jorge Ny, LCSW as Social Worker (Licensed Clinical Social Worker)  Patient Active Problem List   Diagnosis Date Noted   Heart failure ([place]) 01/18/2021   IUD (intrauterine device) in place 10/25/2020   Influenza vaccine refused 08/24/2020   Anxiety and depression 08/24/2020   23-polyvalent pneumococcal polysaccharide vaccine declined 08/24/2020   Tobacco dependence 08/24/2020   Acute on chronic systolic CHF (congestive heart failure) ([place]) 06/17/2019   Obstructive sleep apnea 06/17/2019   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   Diabetes mellitus ([place]) 06/17/2019   Amenorrhea 08/03/2018   Congestive heart failure ([place]) 06/29/2018   Hypotension 06/07/2018   Rhinovirus infection 06/06/2018   Asthma 06/05/2018   Hypokalemia 06/05/2018   Snoring 09/23/2016   Cough 04/10/2016   NICM (nonischemic cardiomyopathy) ([place]) 01/25/2016   Tobacco abuse 01/25/2016   Acute on chronic combined systolic and diastolic CHF (congestive heart failure) ([place]) 01/25/2016   Mitral regurgitation    Abnormal EKG-inf TWI 01/15/2016   Positive D dimer-CTA neg for PE 01/15/2016   Morbid obesity- BMI 57 01/14/2016   Abdominal pain 01/14/2016   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   At risk for sleep apnea 01/14/2016   Cardiomyopathy ([place]) 11/01/2015   Morbid obesity with BMI of 50.0-59.9, adult ([place]) 08/16/2015    Current Outpatient Medications:    Accu-Chek Softclix Lancets lancets, Use as instructed, Disp: 100 each, Rfl: 12   atorvastatin (LIPITOR) 10 MG tablet, TAKE 1 TABLET (10 MG TOTAL) BY MOUTH DAILY (NOON) (Patient taking differently: Take 10 mg by mouth  daily at 12 noon.), Disp: 90 tablet, Rfl: 3   Blood Glucose Monitoring Suppl (ACCU-CHEK GUIDE) w/Device KIT, Use as directed, Disp: 1 kit, Rfl: 0   Blood Glucose Monitoring Suppl (TRUE METRIX METER) w/Device KIT, Use as directed, Disp: 1 kit, Rfl: 0   Blood Pressure Monitoring (3 SERIES BP MONITOR/UPPER ARM) DEVI, Check BP Daily, Disp: 1 each, Rfl: 0   carvedilol (COREG) 12.5 MG tablet, TAKE ONE TABLET BY MOUTH TWICE DAILY (AM+BEDTIME) (Patient taking differently: Take 12.5 mg by mouth 2 (two) times daily with a meal.), Disp: 180 tablet, Rfl: 3   CORLANOR 7.5 MG TABS tablet, TAKE 1 TABLET BY MOUTH 2 (TWO) TIMES DAILY WITH A MEAL (AM+BEDTIME) (Patient taking differently: Take 7.5 mg by mouth in the morning and at bedtime.), Disp: 60 tablet, Rfl: 3   dapagliflozin propanediol (FARXIGA) 10 MG TABS tablet, Take 10 mg by mouth in the morning., Disp: , Rfl:    digoxin (LANOXIN) 0.125 MG tablet, TAKE 1 TABLET (0.125 MG TOTAL) BY MOUTH DAILY (NOON) (Patient taking differently: Take 0.125 mg by mouth daily at 12 noon.), Disp: 90 tablet, Rfl: 3   Dulaglutide (TRULICITY) 1.5 SN/0.5LZ SOPN, Inject 1.5 mg into the skin once a week., Disp: , Rfl:    glucose blood (ACCU-CHEK GUIDE) test strip, Check blood sugars daily before breakfast., Disp: 100 each, Rfl: 12   glucose blood (TRUE METRIX BLOOD GLUCOSE TEST) test strip, Use as instructed, Disp: 100 each, Rfl: 12   ipratropium-albuterol (DUONEB) 0.5-2.5 (3) MG/3ML SOLN, Take 3 mLs by nebulization every 4 (four) hours as needed., Disp: 360 mL, Rfl: 0   levonorgestrel (LILETTA, 52  MG,) 19.5 MCG/DAY IUD IUD, 1 each by Intrauterine route once., Disp: , Rfl:    magnesium oxide (MAG-OX) 400 (240 Mg) MG tablet, Take 1 tablet (400 mg total) by mouth 2 (two) times daily., Disp: 90 tablet, Rfl: 6   metolazone (ZAROXOLYN) 2.5 MG tablet, As directed by HF clinic (Patient taking differently: Take 2.5 mg by mouth once a week. As directed by HF clinic), Disp: 10 tablet, Rfl: 3    milrinone (PRIMACOR) 20 MG/100 ML SOLN infusion, Inject 0.0414 mg/min into the vein continuous., Disp:  , Rfl:    potassium chloride (KLOR-CON) 10 MEQ tablet, TAKE 8 TABS IN THE MORNING, 6 TABS IN THE AFTERNOON, AND 6 TABS IN THE EVENING (Patient taking differently: Take 60-80 mEq by mouth See admin instructions. Take 3mq in the morning, 643m in the afternoon, and 6033min the evening.), Disp: 600 tablet, Rfl: 11   sacubitril-valsartan (ENTRESTO) 97-103 MG, Take 1 tablet by mouth 2 (two) times daily., Disp: 60 tablet, Rfl: 6   spironolactone (ALDACTONE) 25 MG tablet, TAKE ONE TABLET BY MOUTH ONCE DAILY (NOON) (Patient taking differently: Take 25 mg by mouth daily at 12 noon.), Disp: 90 tablet, Rfl: 3   torsemide (DEMADEX) 100 MG tablet, TAKE ONE TABLET BY MOUTH TWICE DAILY (AM+NOON) (Patient taking differently: Take 100 mg by mouth 2 (two) times daily.), Disp: 60 tablet, Rfl: 11 Allergies  Allergen Reactions   Metformin Diarrhea    And caused severe "dry mouth," also   Metformin And Related Diarrhea    And caused severe "dry mouth," also      Social History   Socioeconomic History   Marital status: Single    Spouse name: Not on file   Number of children: 2   Years of education: Not on file   Highest education level: Some college, no degree  Occupational History   Occupation: Disability  Tobacco Use   Smoking status: Former    Pack years: 0.00    Types: Cigarettes    Quit date: 07/18/2016    Years since quitting: 4.6   Smokeless tobacco: Never   Tobacco comments:    3 cigarettes per day  Vaping Use   Vaping Use: Never used  Substance and Sexual Activity   Alcohol use: Not Currently    Alcohol/week: 0.0 standard drinks    Comment: occasionally   Drug use: Not Currently    Types: Marijuana    Comment: only socially   Sexual activity: Yes    Birth control/protection: None  Other Topics Concern   Not on file  Social History Narrative   Not on file   Social Determinants  of Health   Financial Resource Strain: Low Risk    Difficulty of Paying Living Expenses: Not very hard  Food Insecurity: No Food Insecurity   Worried About [employer] fundraiser the Last Year: Never true   Ran Out of Food in the Last Year: Never true  Transportation Needs: No Transportation Needs   Lack of Transportation (Medical): No   Lack of Transportation (Non-Medical): No  Physical Activity: Not on file  Stress: Not on file  Social Connections: Not on file  Intimate Partner Violence: Not on file    Physical Exam      Future Appointments  Date Time Provider DepFrontenac/15/2022  8:30 AM [facility] PA/NP [facility] None  04/23/2021  8:35 AM [facility] DEVICE REMOTES [facility]  05/17/2021  1:30 PM JohLadell PierD [facility] None  07/23/2021  8:35 AM [facility] DEVICE REMOTES [facility]  10/22/2021  8:35 AM [facility] DEVICE REMOTES [facility]  01/21/2022  8:35 AM [facility] DEVICE REMOTES [facility]    BP (!) 90/0   Pulse 78   Resp 18   SpO2 98%  Weight today-? Weight yesterday-353 Last visit weight-352 CBG's at home between 112-130s   Pt reports she is doing ok. She goes to her sleep study tonight-but now she may not have a ride b/c her ride had to go to hosp.  But now she had her hair done and she has product/oil in it so she is going to call to see if she can still go to sleep study.  She denies increased sob, no dizziness,  Reminded her of the appointment on Friday-she is going to call [facility] tx[facility].  She has not had her trulicity due to missing her PCP appointment. Looks like her next appoint is in [place].  She will reorder her refills of bubble packs.  She reports her landlord thru the program has raised her rent, and has thru October to find her another place. She is on housing [employer].   She is missing the night time dose of potassium about each night.  She does not like the way her meds make her  feel.   [name], [facility] Paramedic  03/28/21

## 2021-03-28 DIAGNOSIS — I5023 Acute on chronic systolic (congestive) heart failure: Secondary | ICD-10-CM | POA: Diagnosis not present

## 2021-03-28 DIAGNOSIS — Z6841 Body Mass Index (BMI) 40.0 and over, adult: Secondary | ICD-10-CM | POA: Diagnosis not present

## 2021-03-28 DIAGNOSIS — I272 Pulmonary hypertension, unspecified: Secondary | ICD-10-CM | POA: Diagnosis not present

## 2021-03-28 DIAGNOSIS — Z452 Encounter for adjustment and management of vascular access device: Secondary | ICD-10-CM | POA: Diagnosis not present

## 2021-03-28 DIAGNOSIS — Z7984 Long term (current) use of oral hypoglycemic drugs: Secondary | ICD-10-CM | POA: Diagnosis not present

## 2021-03-28 DIAGNOSIS — J45909 Unspecified asthma, uncomplicated: Secondary | ICD-10-CM | POA: Diagnosis not present

## 2021-03-28 DIAGNOSIS — I5022 Chronic systolic (congestive) heart failure: Secondary | ICD-10-CM | POA: Diagnosis not present

## 2021-03-28 DIAGNOSIS — Z794 Long term (current) use of insulin: Secondary | ICD-10-CM | POA: Diagnosis not present

## 2021-03-28 DIAGNOSIS — E119 Type 2 diabetes mellitus without complications: Secondary | ICD-10-CM | POA: Diagnosis not present

## 2021-03-28 DIAGNOSIS — O903 Peripartum cardiomyopathy: Secondary | ICD-10-CM | POA: Diagnosis not present

## 2021-03-28 DIAGNOSIS — I340 Nonrheumatic mitral (valve) insufficiency: Secondary | ICD-10-CM | POA: Diagnosis not present

## 2021-03-28 DIAGNOSIS — F17210 Nicotine dependence, cigarettes, uncomplicated: Secondary | ICD-10-CM | POA: Diagnosis not present

## 2021-03-28 DIAGNOSIS — G4733 Obstructive sleep apnea (adult) (pediatric): Secondary | ICD-10-CM | POA: Diagnosis not present

## 2021-03-30 ENCOUNTER — Encounter (HOSPITAL_COMMUNITY): Payer: Medicaid Other

## 2021-04-04 DIAGNOSIS — G4733 Obstructive sleep apnea (adult) (pediatric): Secondary | ICD-10-CM | POA: Diagnosis not present

## 2021-04-04 DIAGNOSIS — I272 Pulmonary hypertension, unspecified: Secondary | ICD-10-CM | POA: Diagnosis not present

## 2021-04-04 DIAGNOSIS — I5022 Chronic systolic (congestive) heart failure: Secondary | ICD-10-CM | POA: Diagnosis not present

## 2021-04-04 DIAGNOSIS — Z7984 Long term (current) use of oral hypoglycemic drugs: Secondary | ICD-10-CM | POA: Diagnosis not present

## 2021-04-04 DIAGNOSIS — Z6841 Body Mass Index (BMI) 40.0 and over, adult: Secondary | ICD-10-CM | POA: Diagnosis not present

## 2021-04-04 DIAGNOSIS — F17210 Nicotine dependence, cigarettes, uncomplicated: Secondary | ICD-10-CM | POA: Diagnosis not present

## 2021-04-04 DIAGNOSIS — I340 Nonrheumatic mitral (valve) insufficiency: Secondary | ICD-10-CM | POA: Diagnosis not present

## 2021-04-04 DIAGNOSIS — J45909 Unspecified asthma, uncomplicated: Secondary | ICD-10-CM | POA: Diagnosis not present

## 2021-04-04 DIAGNOSIS — Z794 Long term (current) use of insulin: Secondary | ICD-10-CM | POA: Diagnosis not present

## 2021-04-04 DIAGNOSIS — Z452 Encounter for adjustment and management of vascular access device: Secondary | ICD-10-CM | POA: Diagnosis not present

## 2021-04-04 DIAGNOSIS — I5023 Acute on chronic systolic (congestive) heart failure: Secondary | ICD-10-CM | POA: Diagnosis not present

## 2021-04-04 DIAGNOSIS — E119 Type 2 diabetes mellitus without complications: Secondary | ICD-10-CM | POA: Diagnosis not present

## 2021-04-04 DIAGNOSIS — O903 Peripartum cardiomyopathy: Secondary | ICD-10-CM | POA: Diagnosis not present

## 2021-04-06 ENCOUNTER — Ambulatory Visit (INDEPENDENT_AMBULATORY_CARE_PROVIDER_SITE_OTHER): Payer: Medicaid Other | Admitting: Obstetrics & Gynecology

## 2021-04-06 ENCOUNTER — Other Ambulatory Visit (HOSPITAL_COMMUNITY)
Admission: RE | Admit: 2021-04-06 | Discharge: 2021-04-06 | Disposition: A | Discharge status: Home / Self Care | Payer: Medicaid Other | Source: Ambulatory Visit | Attending: Obstetrics & Gynecology | Admitting: Obstetrics & Gynecology

## 2021-04-06 ENCOUNTER — Other Ambulatory Visit: Payer: Self-pay

## 2021-04-06 ENCOUNTER — Encounter: Payer: Self-pay | Admitting: Obstetrics & Gynecology

## 2021-04-06 VITALS — BP 110/63 | HR 86 | Wt 358.4 lb

## 2021-04-06 DIAGNOSIS — Z113 Encounter for screening for infections with a predominantly sexual mode of transmission: Secondary | ICD-10-CM | POA: Insufficient documentation

## 2021-04-06 DIAGNOSIS — N760 Acute vaginitis: Secondary | ICD-10-CM

## 2021-04-06 DIAGNOSIS — Z5941 Food insecurity: Secondary | ICD-10-CM

## 2021-04-06 DIAGNOSIS — Z975 Presence of (intrauterine) contraceptive device: Secondary | ICD-10-CM | POA: Diagnosis not present

## 2021-04-06 DIAGNOSIS — A5901 Trichomonal vulvovaginitis: Secondary | ICD-10-CM | POA: Diagnosis not present

## 2021-04-06 DIAGNOSIS — Z30431 Encounter for routine checking of intrauterine contraceptive device: Secondary | ICD-10-CM | POA: Diagnosis not present

## 2021-04-06 DIAGNOSIS — N898 Other specified noninflammatory disorders of vagina: Secondary | ICD-10-CM | POA: Diagnosis not present

## 2021-04-06 DIAGNOSIS — B9689 Other specified bacterial agents as the cause of diseases classified elsewhere: Secondary | ICD-10-CM | POA: Diagnosis not present

## 2021-04-06 HISTORY — DX: Trichomonal vulvovaginitis: A59.01

## 2021-04-06 NOTE — Progress Notes (Signed)
GYNECOLOGY OFFICE VISIT NOTE  History:   Kirsten Wheeler is a 40 y.o. [phone] with a complicated medical history here today for IUD check; she feels the IUD is falling out.  Had Liletta placed on 10/24/2020 and had normal check in 11/2020. She thinks she can feel it when she bears down. Also reports abnormal white vaginal discharge for a few days, desires evaluation and a STI screen. Also reported food insecurity issue.  She denies any abnormal vaginal discharge, bleeding, pelvic pain or other concerns.    Past Medical History:  Diagnosis Date   Asthma    Chronic systolic CHF (congestive heart failure) (HCC)    a. ECHO 01/15/2016 EF 10-15%. 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   Diabetes mellitus (HCC) 06/17/2019   Hearing loss    bilateral   Mitral regurgitation    a. severe, felt to be functional 2/2 LV dilation    Morbid obesity (HCC)    Snoring     Past Surgical History:  Procedure Laterality Date   CARDIAC CATHETERIZATION N/A 01/18/2016   Procedure: Right/Left Heart Cath and Coronary Angiography;  Surgeon: Laurey Morale, MD;  [facility] INVASIVE CV LAB;  Service: Cardiovascular;  Laterality: N/A;   CESAREAN SECTION     ICD IMPLANT N/A 04/17/2017   Procedure: ICD Implant;  Surgeon: Regan Lemming, MD;  Location: [facility] INVASIVE CV LAB;  Service: Cardiovascular;  Laterality: N/A;   IR FLUORO GUIDE CV LINE RIGHT  06/23/2019   IR FLUORO GUIDE CV LINE RIGHT  10/15/2019   IR FLUORO GUIDE CV LINE RIGHT  01/19/2021   IR REMOVAL TUN CV CATH W/O FL  10/15/2019   IR US GUIDE VASC ACCESS RIGHT  06/23/2019   IR US GUIDE VASC ACCESS RIGHT  10/15/2019   LEAD REVISION/REPAIR N/A 04/21/2017   Procedure: Lead Revision/Repair;  Surgeon: Regan Lemming, MD;  [facility] INVASIVE CV LAB;  Service: Cardiovascular;  Laterality: N/A;   RIGHT HEART CATH N/A 06/21/2019   Procedure: RIGHT HEART CATH;  Surgeon: Laurey Morale, MD;  Location: [facility] INVASIVE CV LAB;  Service: Cardiovascular;  Laterality:  N/A;   TONSILLECTOMY     UNILATERAL SALPINGECTOMY  11/15/2007   unsure of which tube, ectopic pregnancy     The following portions of the patient's history were reviewed and updated as appropriate: allergies, current medications, past family history, past medical history, past social history, past surgical history and problem list.   Health Maintenance:  Normal pap and negative HRHPV on 08/16/2020.   Review of Systems:  Pertinent items noted in HPI and remainder of comprehensive ROS otherwise negative.  Physical Exam:  BP 110/63   Pulse 86   Wt (!) 358 lb 6.4 oz (162.6 kg)   BMI 59.64 kg/m  CONSTITUTIONAL: Well-developed, well-nourished female in no acute distress.  MUSCULOSKELETAL: Normal range of motion. No edema noted. NEUROLOGIC: Alert and oriented to person, place, and time. Normal muscle tone coordination. No cranial nerve deficit noted. PSYCHIATRIC: Normal mood and affect. Normal behavior. Normal judgment and thought content. CARDIOVASCULAR: Normal heart rate noted RESPIRATORY: Effort and breath sounds normal, no problems with respiration noted ABDOMEN: No masses noted. Obese. No other overt distention noted.   PELVIC: Normal appearing external genitalia; normal urethral meatus; normal appearing vaginal mucosa and cervix.  Liletta IUD strings seen about 3 cm in length outside cervix, IUD itself not visualized or palpated.  White discharge noted, test sample obtained. Performed in the presence of a chaperone.  Assessment and Plan:     1. IUD check up 2. Liletta IUD (intrauterine device) in place since 10/24/2020 Patient reassured by exam findings.   3. Vaginal discharge - Cervicovaginal ancillary only( [place]) done, will follow up results and manage accordingly.  4. Routine screening for STI (sexually transmitted infection) STI screen done, will follow up results and manage accordingly.  Safe sex practices emphasized. - RPR+HBsAg+HCVAb+... - Cervicovaginal ancillary  only( [place])  5. Food insecurity Patient was escorted to out [employer] after this visit and referred to [employer]. - AMBULATORY REFERRAL TO [facility]   Routine preventative health maintenance measures emphasized. Please refer to After Visit Summary for other counseling recommendations.   Return for any gynecologic concerns.    I spent 21 minutes dedicated to the care of this patient including pre-visit review of records, face to face time with the patient discussing her conditions and treatments and post visit orders.    Jaynie Collins, MD, FACOG Obstetrician & Gynecologist, [facility] for [employer], [facility]

## 2021-04-07 LAB — RPR+HBSAG+HCVAB+...
HIV Screen 4th Generation wRfx: NONREACTIVE
Hep C Virus Ab: <0.1 s/co ratio (ref 0.0–0.9)
Hepatitis B Surface Ag: NEGATIVE
RPR Ser Ql: NONREACTIVE

## 2021-04-09 ENCOUNTER — Encounter: Payer: Self-pay | Admitting: Obstetrics & Gynecology

## 2021-04-09 LAB — CERVICOVAGINAL ANCILLARY ONLY
Bacterial Vaginitis (gardnerella): POSITIVE — AB
Candida Glabrata: NEGATIVE
Candida Vaginitis: NEGATIVE
Chlamydia: NEGATIVE
Comment: NEGATIVE
Comment: NEGATIVE
Comment: NEGATIVE
Comment: NEGATIVE
Comment: NEGATIVE
Comment: NORMAL
Neisseria Gonorrhea: NEGATIVE
Trichomonas: POSITIVE — AB

## 2021-04-09 MED ORDER — METRONIDAZOLE 500 MG PO TABS: 500.0000 mg | ORAL_TABLET | Freq: Two times a day (BID) | ORAL | 1 refills | Status: AC

## 2021-04-09 NOTE — Addendum Note (Signed)
Addended by: Jaynie Collins A on: 04/09/2021 02:15 PM   Modules accepted: Orders

## 2021-04-09 NOTE — Progress Notes (Signed)
Patient has trichomonal vaginitis.  Negative testing for other STIs,  but she needs to let partner(s) know so the partner(s) can get testing and treatment. Patient and sex partner(s) should abstain from unprotected sexual activity for seven days after everyone receives appropriate treatment.  Metronidazole was prescribed for patient, this will also treat the bacterial vaginitis also noted.  Patient will need to return in about 4 weeks after treatment for repeat test of cure.  Please call to inform patient of results and recommendations, and advise to pick up prescription. Please advise patient to practice safe sex at all times. Results were released to MyChart and patient was given recommendations as indicated.   Jaynie Collins, MD

## 2021-04-10 ENCOUNTER — Encounter: Payer: Self-pay | Admitting: *Deleted

## 2021-04-11 DIAGNOSIS — Z452 Encounter for adjustment and management of vascular access device: Secondary | ICD-10-CM | POA: Diagnosis not present

## 2021-04-11 DIAGNOSIS — E119 Type 2 diabetes mellitus without complications: Secondary | ICD-10-CM | POA: Diagnosis not present

## 2021-04-11 DIAGNOSIS — G4733 Obstructive sleep apnea (adult) (pediatric): Secondary | ICD-10-CM | POA: Diagnosis not present

## 2021-04-11 DIAGNOSIS — I5022 Chronic systolic (congestive) heart failure: Secondary | ICD-10-CM | POA: Diagnosis not present

## 2021-04-11 DIAGNOSIS — Z7984 Long term (current) use of oral hypoglycemic drugs: Secondary | ICD-10-CM | POA: Diagnosis not present

## 2021-04-11 DIAGNOSIS — O903 Peripartum cardiomyopathy: Secondary | ICD-10-CM | POA: Diagnosis not present

## 2021-04-11 DIAGNOSIS — F17210 Nicotine dependence, cigarettes, uncomplicated: Secondary | ICD-10-CM | POA: Diagnosis not present

## 2021-04-11 DIAGNOSIS — J45909 Unspecified asthma, uncomplicated: Secondary | ICD-10-CM | POA: Diagnosis not present

## 2021-04-11 DIAGNOSIS — I340 Nonrheumatic mitral (valve) insufficiency: Secondary | ICD-10-CM | POA: Diagnosis not present

## 2021-04-11 DIAGNOSIS — Z794 Long term (current) use of insulin: Secondary | ICD-10-CM | POA: Diagnosis not present

## 2021-04-11 DIAGNOSIS — I272 Pulmonary hypertension, unspecified: Secondary | ICD-10-CM | POA: Diagnosis not present

## 2021-04-11 DIAGNOSIS — I5023 Acute on chronic systolic (congestive) heart failure: Secondary | ICD-10-CM | POA: Diagnosis not present

## 2021-04-11 DIAGNOSIS — Z6841 Body Mass Index (BMI) 40.0 and over, adult: Secondary | ICD-10-CM | POA: Diagnosis not present

## 2021-04-12 ENCOUNTER — Other Ambulatory Visit (HOSPITAL_COMMUNITY): Payer: Self-pay | Admitting: Cardiology

## 2021-04-16 ENCOUNTER — Telehealth (HOSPITAL_COMMUNITY): Payer: Self-pay

## 2021-04-16 ENCOUNTER — Other Ambulatory Visit (HOSPITAL_COMMUNITY): Payer: Self-pay | Admitting: Cardiology

## 2021-04-16 NOTE — Telephone Encounter (Signed)
Spoke to pt and she is leaving tomor to go out of town for [employer].  I confirmed with her she has her meds, she is just waiting on nurse to bring the milrinone.  She denies any other needs before leaving town.   Kerry Hough, EMT-Paramedic  04/16/21

## 2021-05-02 DIAGNOSIS — I5023 Acute on chronic systolic (congestive) heart failure: Secondary | ICD-10-CM | POA: Diagnosis not present

## 2021-05-02 DIAGNOSIS — Z452 Encounter for adjustment and management of vascular access device: Secondary | ICD-10-CM | POA: Diagnosis not present

## 2021-05-02 DIAGNOSIS — O903 Peripartum cardiomyopathy: Secondary | ICD-10-CM | POA: Diagnosis not present

## 2021-05-02 DIAGNOSIS — I5022 Chronic systolic (congestive) heart failure: Secondary | ICD-10-CM | POA: Diagnosis not present

## 2021-05-02 DIAGNOSIS — I272 Pulmonary hypertension, unspecified: Secondary | ICD-10-CM | POA: Diagnosis not present

## 2021-05-02 DIAGNOSIS — I340 Nonrheumatic mitral (valve) insufficiency: Secondary | ICD-10-CM | POA: Diagnosis not present

## 2021-05-02 DIAGNOSIS — Z79899 Other long term (current) drug therapy: Secondary | ICD-10-CM | POA: Diagnosis not present

## 2021-05-02 DIAGNOSIS — Z6841 Body Mass Index (BMI) 40.0 and over, adult: Secondary | ICD-10-CM | POA: Diagnosis not present

## 2021-05-02 DIAGNOSIS — E119 Type 2 diabetes mellitus without complications: Secondary | ICD-10-CM | POA: Diagnosis not present

## 2021-05-02 DIAGNOSIS — J45909 Unspecified asthma, uncomplicated: Secondary | ICD-10-CM | POA: Diagnosis not present

## 2021-05-02 DIAGNOSIS — G4733 Obstructive sleep apnea (adult) (pediatric): Secondary | ICD-10-CM | POA: Diagnosis not present

## 2021-05-02 DIAGNOSIS — Z7984 Long term (current) use of oral hypoglycemic drugs: Secondary | ICD-10-CM | POA: Diagnosis not present

## 2021-05-02 DIAGNOSIS — F17210 Nicotine dependence, cigarettes, uncomplicated: Secondary | ICD-10-CM | POA: Diagnosis not present

## 2021-05-09 DIAGNOSIS — Z79899 Other long term (current) drug therapy: Secondary | ICD-10-CM | POA: Diagnosis not present

## 2021-05-09 DIAGNOSIS — G4733 Obstructive sleep apnea (adult) (pediatric): Secondary | ICD-10-CM | POA: Diagnosis not present

## 2021-05-09 DIAGNOSIS — F17210 Nicotine dependence, cigarettes, uncomplicated: Secondary | ICD-10-CM | POA: Diagnosis not present

## 2021-05-09 DIAGNOSIS — I340 Nonrheumatic mitral (valve) insufficiency: Secondary | ICD-10-CM | POA: Diagnosis not present

## 2021-05-09 DIAGNOSIS — I5022 Chronic systolic (congestive) heart failure: Secondary | ICD-10-CM | POA: Diagnosis not present

## 2021-05-09 DIAGNOSIS — Z7984 Long term (current) use of oral hypoglycemic drugs: Secondary | ICD-10-CM | POA: Diagnosis not present

## 2021-05-09 DIAGNOSIS — I272 Pulmonary hypertension, unspecified: Secondary | ICD-10-CM | POA: Diagnosis not present

## 2021-05-09 DIAGNOSIS — Z452 Encounter for adjustment and management of vascular access device: Secondary | ICD-10-CM | POA: Diagnosis not present

## 2021-05-09 DIAGNOSIS — E119 Type 2 diabetes mellitus without complications: Secondary | ICD-10-CM | POA: Diagnosis not present

## 2021-05-09 DIAGNOSIS — I5023 Acute on chronic systolic (congestive) heart failure: Secondary | ICD-10-CM | POA: Diagnosis not present

## 2021-05-09 DIAGNOSIS — Z6841 Body Mass Index (BMI) 40.0 and over, adult: Secondary | ICD-10-CM | POA: Diagnosis not present

## 2021-05-09 DIAGNOSIS — J45909 Unspecified asthma, uncomplicated: Secondary | ICD-10-CM | POA: Diagnosis not present

## 2021-05-09 DIAGNOSIS — O903 Peripartum cardiomyopathy: Secondary | ICD-10-CM | POA: Diagnosis not present

## 2021-05-10 ENCOUNTER — Ambulatory Visit: Payer: Medicaid Other

## 2021-05-15 ENCOUNTER — Telehealth: Payer: Self-pay | Admitting: Internal Medicine

## 2021-05-15 ENCOUNTER — Other Ambulatory Visit (HOSPITAL_COMMUNITY): Payer: Self-pay

## 2021-05-15 NOTE — Progress Notes (Signed)
We had sch appointment today but pt requested we change it to tomor.   Kerry Hough, EMT-Paramedic 05/15/21

## 2021-05-15 NOTE — Telephone Encounter (Signed)
Patient has appt with Dr. Laural Benes 9/1. Provider will be working virtual. Called patient no answer and vm not set up. If patient returns call make aware appt is virtual.

## 2021-05-16 ENCOUNTER — Other Ambulatory Visit (HOSPITAL_COMMUNITY): Payer: Self-pay
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
## 2021-05-17 ENCOUNTER — Ambulatory Visit: Payer: Medicaid Other | Attending: Internal Medicine | Admitting: Internal Medicine

## 2021-05-17 ENCOUNTER — Other Ambulatory Visit: Payer: Self-pay

## 2021-05-17 DIAGNOSIS — I272 Pulmonary hypertension, unspecified: Secondary | ICD-10-CM | POA: Diagnosis not present

## 2021-05-17 DIAGNOSIS — E1169 Type 2 diabetes mellitus with other specified complication: Secondary | ICD-10-CM | POA: Diagnosis not present

## 2021-05-17 DIAGNOSIS — Z7984 Long term (current) use of oral hypoglycemic drugs: Secondary | ICD-10-CM | POA: Diagnosis not present

## 2021-05-17 DIAGNOSIS — G4733 Obstructive sleep apnea (adult) (pediatric): Secondary | ICD-10-CM

## 2021-05-17 DIAGNOSIS — Z79899 Other long term (current) drug therapy: Secondary | ICD-10-CM | POA: Diagnosis not present

## 2021-05-17 DIAGNOSIS — I5022 Chronic systolic (congestive) heart failure: Secondary | ICD-10-CM

## 2021-05-17 DIAGNOSIS — F17200 Nicotine dependence, unspecified, uncomplicated: Secondary | ICD-10-CM | POA: Diagnosis not present

## 2021-05-17 DIAGNOSIS — Z6841 Body Mass Index (BMI) 40.0 and over, adult: Secondary | ICD-10-CM | POA: Diagnosis not present

## 2021-05-17 DIAGNOSIS — Z452 Encounter for adjustment and management of vascular access device: Secondary | ICD-10-CM | POA: Diagnosis not present

## 2021-05-17 DIAGNOSIS — J45909 Unspecified asthma, uncomplicated: Secondary | ICD-10-CM | POA: Diagnosis not present

## 2021-05-17 DIAGNOSIS — O903 Peripartum cardiomyopathy: Secondary | ICD-10-CM | POA: Diagnosis not present

## 2021-05-17 DIAGNOSIS — I340 Nonrheumatic mitral (valve) insufficiency: Secondary | ICD-10-CM | POA: Diagnosis not present

## 2021-05-17 DIAGNOSIS — E119 Type 2 diabetes mellitus without complications: Secondary | ICD-10-CM | POA: Diagnosis not present

## 2021-05-17 DIAGNOSIS — F17210 Nicotine dependence, cigarettes, uncomplicated: Secondary | ICD-10-CM | POA: Diagnosis not present

## 2021-05-17 MED ORDER — DAPAGLIFLOZIN PROPANEDIOL 10 MG PO TABS: 10.0000 mg | ORAL_TABLET | Freq: Every morning | ORAL | 6 refills | Status: DC

## 2021-05-17 MED ORDER — TRULICITY 1.5 MG/0.5ML ~~LOC~~ SOAJ: 1.5000 mg | SUBCUTANEOUS | 6 refills | Status: DC

## 2021-05-17 NOTE — Progress Notes (Signed)
[name], female   DOB: Feb 05, 1981, 40 y.o.   MRN: [id number] Virtual Visit via Telephone Note  I connected with Higinio Plan on 05/17/2021 at 1:39 p.m by telephone and verified that I am speaking with the correct person using two identifiers  Location: Patient: home Provider: office  Participants: Myself Patient   I discussed the limitations, risks, security and privacy concerns of performing an evaluation and management service by telephone and the availability of in person appointments. I also discussed with the patient that there may be a patient responsible charge related to this service. The patient expressed understanding and agreed to proceed.   History of Present Illness: Patient with history of NICM with ICD, chronic systolic CHF end-stage on milrinone infusion once a week as a bridge to LVAD (not a candidate for transplant due to her weight), MR moderate, asthma, OSA on CPAP, tobacco dependence, morbid obesity, DM.  Last evaluation 10/2020.  Today's visit is chronic disease management.  DM: out of Trulicity x 1 mth.  States she had to see me before she gets RF.  Not sure if she is getting [place] Checks BS TID.  In a.m BS in the 200s since being out of Trulicity.  When on Trulicity BS were in the 100s.  -feels she needs to eat more.  Usually just 1 meal a day.  Snacks on chips, rice cakes   OSA:  she did not get her sleep study done as was planned in 12/2020 but she had gotten braids on hair the day before.  She did not reschedule.  Looks like she saw her cardiologist back in April and he reordered the sleep study.  Currently not using CPAP because machine not working  NICM/systolic CHF:  still on Milrinone.  Reports cardiology are trying to wean her off the Milrinone No LE edema or SOB at this time.  Sleeping on 2 pillows; no PND Compliant with meds Down to 3 cigarettes/day.  Trying to quit.  Did not like the nicotine gum.  Outpatient Encounter Medications as of  05/17/2021  Medication Sig   Accu-Chek Softclix Lancets lancets Use as instructed   atorvastatin (LIPITOR) 10 MG tablet TAKE 1 TABLET (10 MG TOTAL) BY MOUTH DAILY (NOON) (Patient taking differently: Take 10 mg by mouth daily at 12 noon.)   Blood Glucose Monitoring Suppl (ACCU-CHEK GUIDE) w/Device KIT Use as directed   Blood Glucose Monitoring Suppl (TRUE METRIX METER) w/Device KIT Use as directed   Blood Pressure Monitoring (3 SERIES BP MONITOR/UPPER ARM) DEVI Check BP Daily   carvedilol (COREG) 12.5 MG tablet TAKE ONE TABLET BY MOUTH TWICE DAILY (AM+BEDTIME) (Patient taking differently: Take 12.5 mg by mouth 2 (two) times daily with a meal.)   CORLANOR 7.5 MG TABS tablet TAKE 1 TABLET BY MOUTH 2 (TWO) TIMES DAILY WITH A MEAL (AM+BEDTIME) (Patient taking differently: Take 7.5 mg by mouth in the morning and at bedtime.)   dapagliflozin propanediol (FARXIGA) 10 MG TABS tablet Take 10 mg by mouth in the morning.   digoxin (LANOXIN) 0.125 MG tablet TAKE 1 TABLET (0.125 MG TOTAL) BY MOUTH DAILY (NOON) (Patient taking differently: Take 0.125 mg by mouth daily at 12 noon.)   Dulaglutide (TRULICITY) 1.5 JM/4.2AS SOPN Inject 1.5 mg into the skin once a week. (Patient not taking: Reported on 05/16/2021)   glucose blood (ACCU-CHEK GUIDE) test strip Check blood sugars daily before breakfast.   glucose blood (TRUE METRIX BLOOD GLUCOSE TEST) test strip Use as instructed   ipratropium-albuterol (  DUONEB) 0.5-2.5 (3) MG/3ML SOLN Take 3 mLs by nebulization every 4 (four) hours as needed.   levonorgestrel (LILETTA, 52 MG,) 19.5 MCG/DAY IUD IUD 1 each by Intrauterine route once.   magnesium oxide (MAG-OX) 400 (240 Mg) MG tablet Take 1 tablet (400 mg total) by mouth 2 (two) times daily.   metolazone (ZAROXOLYN) 2.5 MG tablet As directed by HF clinic (Patient taking differently: Take 2.5 mg by mouth once a week. As directed by HF clinic)   milrinone (PRIMACOR) 20 MG/100 ML SOLN infusion Inject 0.0414 mg/min into the vein  continuous.   potassium chloride (KLOR-CON) 10 MEQ tablet TAKE 8 TABS IN THE MORNING, 6 TABS IN THE AFTERNOON, AND 6 TABS IN THE EVENING (Patient taking differently: Take 60-80 mEq by mouth See admin instructions. Take 35mq in the morning, 683m in the afternoon, and 6037min the evening.)   sacubitril-valsartan (ENTRESTO) 97-103 MG Take 1 tablet by mouth 2 (two) times daily.   spironolactone (ALDACTONE) 25 MG tablet TAKE ONE TABLET BY MOUTH ONCE DAILY (NOON) (Patient taking differently: Take 25 mg by mouth daily at 12 noon.)   torsemide (DEMADEX) 100 MG tablet TAKE ONE TABLET BY MOUTH TWICE DAILY (AM+NOON) (Patient taking differently: Take 100 mg by mouth 2 (two) times daily.)   valsartan (DIOVAN) 160 MG tablet Take 1 tablet by mouth daily.   No facility-administered encounter medications on file as of 05/17/2021.      Observations/Objective: Lab Results  Component Value Date   HGBA1C 8.6 (H) 01/19/2021     Chemistry      Component Value Date/Time   NA 135 01/19/2021 1433   NA 136 08/24/2020 1000   K 3.4 (L) 01/19/2021 1433   CL 97 (L) 01/19/2021 1433   CO2 30 01/19/2021 1433   BUN 12 01/19/2021 1433   BUN 16 08/24/2020 1000   CREATININE 1.05 (H) 01/19/2021 1433      Component Value Date/Time   CALCIUM 9.2 01/19/2021 1433   ALKPHOS 74 08/24/2020 1000   AST 18 08/24/2020 1000   ALT 22 08/24/2020 1000   BILITOT 0.8 08/24/2020 1000     Lab Results  Component Value Date   WBC 8.0 01/18/2021   HGB 13.6 01/18/2021   HCT 40.1 01/18/2021   MCV 78.9 (L) 01/18/2021   PLT 289 01/18/2021   Lab Results  Component Value Date   CHOL 144 08/24/2020   HDL 38 (L) 08/24/2020   LDLCALC 75 08/24/2020   TRIG 181 (H) 08/24/2020   CHOLHDL 3.8 08/24/2020     Assessment and Plan: 1. Type 2 diabetes mellitus with morbid obesity (HCCSt. [place]idefill sent on Trulicity and Farxiga. Dietary counseling given.  Recommend healthier snacks like fruits. - Dulaglutide (TRULICITY) 1.5 MG/YQ/8.2NOPN;  Inject 1.5 mg into the skin once a week.  Dispense: 2 mL; Refill: 6 - dapagliflozin propanediol (FARXIGA) 10 MG TABS tablet; Take 1 tablet (10 mg total) by mouth in the morning.  Dispense: 30 tablet; Refill: 6  2. Tobacco dependence Strongly advised to quit.  She is aware of health risks associated with smoking.  She tells me she is trying to quit.  I have encouraged her to set a quit date.  1 minute spent on counseling.  3. OSA (obstructive sleep apnea) I will reorder her sleep study. - PS[facility] Sleep Study; Future  4. Chronic systolic CHF (congestive heart failure) (HCC) Stable on current medications including Entresto, Diovan, Farxiga, spironolactone, torsemide and milrinone.  Followed by cardiology.   Follow Up  Instructions: 2 mths   I discussed the assessment and treatment plan with the patient. The patient was provided an opportunity to ask questions and all were answered. The patient agreed with the plan and demonstrated an understanding of the instructions.   The patient was advised to call back or seek an in-person evaluation if the symptoms worsen or if the condition fails to improve as anticipated.  I  Spent 15 minutes on this telephone encounter  Karle Plumber, MD

## 2021-05-18 ENCOUNTER — Telehealth (HOSPITAL_COMMUNITY): Payer: Self-pay

## 2021-05-18 NOTE — Progress Notes (Addendum)
Advanced Heart Failure Clinic Note   PCP: Ladell Pier, MD PCP-Cardiologist: Dr. Ronney Lion GYN : Dr Norman Herrlich.   HPI: 40 y.o. 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   She presented to the ER on 06/17/19 with several days of worsening HF with SOB at rest, marked edema and orthopnea/PND, not responding to increased doses of oral diuretics. She admitted to high levels of water and Gatorade intake. Her admit wt was 380 lb (Dry wt ~360 lb). She was started on IV Lasix but had initial poor urinary response. PO metolazone added as adjunct. Echo showed  EF < 20%, relatively normal RV, moderate MR. [place] 06/21/19 with low output (CI 1.8) and elevated PCWP but near normal RA pressure. She was started on inotropic therapy w/ milrinone for low output, 0.25 mcg/kg/min. Also treated w/ Entresto, Coreg, spironolactone, digoxin and ivabradine. She was later transitioned off of IV Lasix to po torsemide 80 mg qam/60 mg qpm.  Unfortunately, she was not a transplant candidate due to her morbid obesity. We discussed her at [facility] with Dr. Prescott Gum.  She will need significant weight loss to get to LVAD. Will use home milrinone to try to facilitate increased activity to work towards weight loss.  She understands that milrinone is not a good end-point and that we would like to use it as a bridge to eventual LVAD. RV function seems adequate for LVAD. Tunneled catheter was placed for home milrinone and home health was arranged. She was discharged home on 06/25/19 on milrinone 0.25 mcg/kg/min.   06/27/19 she presented to the [facility] w/ CC of palpitations. Was brought in by EMS and was reportedly in SVT with HR in the 180s. She was given adenosine and converted to NSR prior to arrival to the ED.  PICC exchanged 10/15/19  She now has an IUD for contraception (had a terminated pregnancy over the winter).    01/17/21 went to ED for PICC line displacement. IR  consulted and tunneled PICC was replaced.  Echo (5/22) EF 20-25%. Milrinone weaned to 0.125 mcg.  Today she returns for HF follow up. Last appointment 5/22 milrinone was decreased to 0.125 mcg and she felt fatigued and had a headache initially, but symptoms resolved. Went w/o milrinone for 3 days when she was out of town, (tubing burned after getting caught on stove). She also stopped her milrinone for ~4 days a couple months ago, says her pump shut off and had associated dyspnea. She has not taken her medications today; forgets her medications ~2x/week. Lives on 2nd floor apartment and does not get SOB with stairs. Gets SOB when she misses her medications.  Does not like the way her torsemide or potassium makes her feel. Denies CP, dizziness, edema, or PND/Orthopnea. Appetite ok. No fever or chills. Weight at home 356 pounds. Followed by Paramedicine. She is not sure she wants to stop milrinone, afraid that she will stop breathing if it is off. She is signing paperwork today to move into another apartment. Smoking 3 cigarettes/day, asking about THC use for anxiety.  ICD interrogation (personally reviewed): Fluid index below threshold, stable thoracic impedence, 1-2 hrs/day activity, no VT/VF.  ECG: not performed today   Labs (8/21): K 4.4, creatinine 0.9, hgb 14.4. Labs (06/13/20): K 4, creatinine 0.97  Labs (2/22): K 3.7, creatinine 0.76 Labs (3/22): K 4.4, creatinine 0.73, hgb 13.7 Labs (5/22): K 3.4, creatinine 1.05  PMH:  1. Chronic  systolic CHF: Nonischemic cardiomyopathy.  No family history of cardiomyopathy, no heavy ETOH, cocaine, or amphetamines.  Medtronic ICD.  - Echo (12/16) with EF 20-25%, moderate MR.  - Echo (5/17) with severely dilated LV, EF 10-15%, severe functional MR, mildly decreased RV systolic function. - LHC/RHC (5/17): No significant coronary disease; mean RA 15, PA 61/25 mean 41, mean PCWP 18, CI 1.7 (Fick), PVR 5.4 WU.  - Echo (10/17): EF 20%, severely dilated LV,  normal RV size with mildly decreased systolic function, moderate-severe central MR.  - Echo 04/13/17  LVEF 15-20%, Severe LV dilation, Mod LAE, Mild RAE - CPX (5/19): RER 0.95, peak VO2 9.8, VE/VCO2 26 => submaximal but likely severe functional impairement.  - Echo (7/19): EF 20-25%, moderate LV dilation, RV normal size with mildly decreased systolic function, moderate central MR.  - Echo (10/20): EF < 20%, severe LV enlargement, normal RV>  - RHC (10/20): mean RA 7, PA 65/25, mean PCWP 27, CI 1.8, PVR 3.87, PAPI 5.7.  2. Morbid obesity.  3. 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  Echo (10/17) with moderate-severe MR. Echo (7/19) with moderate central MR.  4. OSA: CPAP.  5. SVT  Current Outpatient Medications  Medication Sig Dispense Refill   Accu-Chek Softclix Lancets lancets Use as instructed 100 each 12   atorvastatin (LIPITOR) 10 MG tablet TAKE 1 TABLET (10 MG TOTAL) BY MOUTH DAILY (NOON) 90 tablet 3   Blood Glucose Monitoring Suppl (ACCU-CHEK GUIDE) w/Device KIT Use as directed 1 kit 0   Blood Glucose Monitoring Suppl (TRUE METRIX METER) w/Device KIT Use as directed 1 kit 0   Blood Pressure Monitoring (3 SERIES BP MONITOR/UPPER ARM) DEVI Check BP Daily 1 each 0   carvedilol (COREG) 12.5 MG tablet TAKE ONE TABLET BY MOUTH TWICE DAILY (AM+BEDTIME) 180 tablet 3   CORLANOR 7.5 MG TABS tablet TAKE 1 TABLET BY MOUTH 2 (TWO) TIMES DAILY WITH A MEAL (AM+BEDTIME) 60 tablet 3   dapagliflozin propanediol (FARXIGA) 10 MG TABS tablet Take 1 tablet (10 mg total) by mouth in the morning. 30 tablet 6   digoxin (LANOXIN) 0.125 MG tablet TAKE 1 TABLET (0.125 MG TOTAL) BY MOUTH DAILY (NOON) 90 tablet 3   Dulaglutide (TRULICITY) 1.5 HW/3.8UE SOPN Inject 1.5 mg into the skin once a week. 2 mL 6   glucose blood (ACCU-CHEK GUIDE) test strip Check blood sugars daily before breakfast. 100 each 12   glucose blood (TRUE METRIX BLOOD GLUCOSE TEST) test strip Use as instructed 100 each 12    levonorgestrel (LILETTA, 52 MG,) 19.5 MCG/DAY IUD IUD 1 each by Intrauterine route once.     magnesium oxide (MAG-OX) 400 (240 Mg) MG tablet Take 1 tablet (400 mg total) by mouth 2 (two) times daily. 90 tablet 6   metolazone (ZAROXOLYN) 2.5 MG tablet As directed by HF clinic (Patient taking differently: Take 2.5 mg by mouth once a week. As directed by HF clinic) 10 tablet 3   milrinone (PRIMACOR) 20 MG/100 ML SOLN infusion Inject 0.0414 mg/min into the vein continuous.     potassium chloride (KLOR-CON) 10 MEQ tablet TAKE 8 TABS IN THE MORNING, 6 TABS IN THE AFTERNOON, AND 6 TABS IN THE EVENING (Patient taking differently: Take 60-80 mEq by mouth See admin instructions. Take 82mq in the morning, 651m in the afternoon, and 6036min the evening.) 600 tablet 11   sacubitril-valsartan (ENTRESTO) 97-103 MG Take 1 tablet by mouth 2 (two) times daily. 60 tablet 6   spironolactone (ALDACTONE) 25 MG tablet  TAKE ONE TABLET BY MOUTH ONCE DAILY (NOON) (Patient taking differently: Take 25 mg by mouth daily at 12 noon.) 90 tablet 3   torsemide (DEMADEX) 100 MG tablet TAKE ONE TABLET BY MOUTH TWICE DAILY (AM+NOON) (Patient taking differently: Take 100 mg by mouth 2 (two) times daily.) 60 tablet 11   valsartan (DIOVAN) 160 MG tablet Take 1 tablet by mouth daily.     No current facility-administered medications for this encounter.   Allergies  Allergen Reactions   Metformin Diarrhea    And caused severe "dry mouth," also   Metformin And Related Diarrhea    And caused severe "dry mouth," also   Social History   Socioeconomic History   Marital status: Single    Spouse name: Not on file   Number of children: 2   Years of education: Not on file   Highest education level: Some college, no degree  Occupational History   Occupation: Disability  Tobacco Use   Smoking status: Former    Types: Cigarettes    Quit date: 07/18/2016    Years since quitting: 4.8   Smokeless tobacco: Never   Tobacco comments:     3 cigarettes per day  Vaping Use   Vaping Use: Never used  Substance and Sexual Activity   Alcohol use: Not Currently    Alcohol/week: 0.0 standard drinks    Comment: occasionally   Drug use: Not Currently    Types: Marijuana    Comment: only socially   Sexual activity: Yes    Birth control/protection: None  Other Topics Concern   Not on file  Social History Narrative   Not on file   Social Determinants of Health   Financial Resource Strain: Low Risk    Difficulty of Paying Living Expenses: Not very hard  Food Insecurity: Landscape architect Present   Worried About Charity fundraiser in the Last Year: Sometimes true   Arboriculturist in the Last Year: Sometimes true  Transportation Needs: Unmet Transportation Needs   Lack of Transportation (Medical): No   Lack of Transportation (Non-Medical): Yes  Physical Activity: Not on file  Stress: Not on file  Social Connections: Not on file  Intimate Partner Violence: Not on file   Family History  Problem Relation Age of Onset   Heart attack Mother    Asthma Mother    Heart failure Mother    Hypertension Father    Diabetes Brother    BP 90/76   Pulse 92   Wt (!) 166 kg (366 lb)   SpO2 96%   BMI 60.91 kg/m   Wt Readings from Last 3 Encounters:  05/22/21 (!) 166 kg (366 lb)  04/06/21 (!) 162.6 kg (358 lb 6.4 oz)  02/07/21 (!) 162.1 kg (357 lb 6.4 oz)   PHYSICAL EXAM: General:  NAD. No resp difficulty HEENT: Normal Neck: Supple. Thick neck. Carotids 2+ bilat; no bruits. No lymphadenopathy or thryomegaly appreciated. Cor: PMI nondisplaced. Regular rate & rhythm. No rubs, gallops or murmurs. Lungs: Clear; port C/D/I RU chest. Abdomen: Obese, nontender, nondistended. No hepatosplenomegaly. No bruits or masses. Good bowel sounds. Extremities: No cyanosis, clubbing, rash, edema Neuro: Alert & oriented x 3, cranial nerves grossly intact. Moves all 4 extremities w/o difficulty. Affect pleasant.  ASSESSMENT & PLAN: 1. Chronic  systolic CHF:  Nonischemic cardiomyopathy.  Body habitus precludes cardiac MRI.  Echo 06/2019 with EF < 20%, relatively normal RV, moderate MR.  [place] 06/21/19 with low output (CI 1.8) and elevated PCWP  but near normal RA pressure, started on milrinone 0.25 with plans to continue home milrinone as bridge to possible LVAD if she could achieve significant weight loss.  Repeat echo 5/22 with EF 20-25%. Unfortunately, she has not succeeded in losing much wt. She remains NYHA class II. She is not volume overloaded by exam or Optivol, her weight is up 9 lbs. No VT/VF on device interrogation.   - Continue torsemide 100 mg bid + metolazone once weekly q Wed. Discussed importance of strict compliance  - Continue Coreg 12.5 mg bid.  - Continue Entresto 97/103 mg bid. - Continue ivabradine 7.5 mg bid. HR 92 today. - Continue spironolactone 25 mg daily.  - Continue digoxin 0.125 mg.  - Continue Farxiga 10 mg daily.  - BMET, Co-Ox and dig level today.  - She has been on milrinone for 1.5 years, really not making progress in weight loss so still not a candidate for transplant or LVAD.  D/w Dr. Aundra Dubin, [place] to stop and discontinue PICC. We discussed that she would not be a candidate to re-start home milrinone in the future. She unsure she is ready for this. - She needs to prevent pregnancy. She now has an IUD. 2. Morbid obesity:  Body mass index is 60.91 kg/m. - Turned down for bariatric surgery due to insurance.  - Discussed portion control.  - Continue [employer] PREP class.  3. SVT: had ED visit 10/20 for SVT noted by EMS that converted back to NSR after dose of adenosine. This was in the setting of hypokalemia and hypomagnesemia, at 3.0 and 1.5 respectively. Also in the setting of inotropic therapy w/ milrinone.  No AT/AF on device interrogation today. 4. OSA: sleep study has been re-ordered by PCP. She has not completed this. 5. DMII: On Farxiga + Trulicity.   Discussed with Dr. Aundra Dubin today, she can stop her  milrinone, however she will not be a candidate for future home milrinone.   Greater than 50% of the (total minutes 40 minutes) visit spent in counseling/coordination of care regarding medication compliance, stopping milrinone vs continuing, and the risks vs benefits of such. She is highly anxious about stopping her milrinone at this time. Home health will continue checking weekly labs.   Will have her follow up with Dr. Aundra Dubin in 6 weeks to discuss further.   [place], FNP 05/22/21

## 2021-05-18 NOTE — Telephone Encounter (Signed)
Called patient to confirm/remind patient of their appointment at the Advanced [facility] Failure Clinic on 05/22/21. However,  I was unable to leave a voice message.

## 2021-05-22 ENCOUNTER — Other Ambulatory Visit: Payer: Self-pay

## 2021-05-22 ENCOUNTER — Ambulatory Visit (HOSPITAL_COMMUNITY)
Admission: RE | Admit: 2021-05-22 | Discharge: 2021-05-22 | Disposition: A | Discharge status: Home / Self Care | Payer: Medicaid Other | Source: Ambulatory Visit | Attending: Family Medicine | Admitting: Family Medicine

## 2021-05-22 ENCOUNTER — Encounter (HOSPITAL_COMMUNITY): Payer: Self-pay

## 2021-05-22 VITALS — BP 90/76 | HR 92 | Wt 366.0 lb

## 2021-05-22 DIAGNOSIS — F17210 Nicotine dependence, cigarettes, uncomplicated: Secondary | ICD-10-CM | POA: Insufficient documentation

## 2021-05-22 DIAGNOSIS — Z8679 Personal history of other diseases of the circulatory system: Secondary | ICD-10-CM

## 2021-05-22 DIAGNOSIS — Z95810 Presence of automatic (implantable) cardiac defibrillator: Secondary | ICD-10-CM | POA: Insufficient documentation

## 2021-05-22 DIAGNOSIS — G4730 Sleep apnea, unspecified: Secondary | ICD-10-CM

## 2021-05-22 DIAGNOSIS — Z833 Family history of diabetes mellitus: Secondary | ICD-10-CM | POA: Diagnosis not present

## 2021-05-22 DIAGNOSIS — Z79899 Other long term (current) drug therapy: Secondary | ICD-10-CM | POA: Insufficient documentation

## 2021-05-22 DIAGNOSIS — I5022 Chronic systolic (congestive) heart failure: Secondary | ICD-10-CM | POA: Insufficient documentation

## 2021-05-22 DIAGNOSIS — E119 Type 2 diabetes mellitus without complications: Secondary | ICD-10-CM | POA: Insufficient documentation

## 2021-05-22 DIAGNOSIS — Z7984 Long term (current) use of oral hypoglycemic drugs: Secondary | ICD-10-CM | POA: Insufficient documentation

## 2021-05-22 DIAGNOSIS — Z794 Long term (current) use of insulin: Secondary | ICD-10-CM | POA: Diagnosis not present

## 2021-05-22 DIAGNOSIS — G4733 Obstructive sleep apnea (adult) (pediatric): Secondary | ICD-10-CM | POA: Diagnosis not present

## 2021-05-22 DIAGNOSIS — E876 Hypokalemia: Secondary | ICD-10-CM | POA: Diagnosis not present

## 2021-05-22 DIAGNOSIS — Z975 Presence of (intrauterine) contraceptive device: Secondary | ICD-10-CM | POA: Insufficient documentation

## 2021-05-22 DIAGNOSIS — I471 Supraventricular tachycardia: Secondary | ICD-10-CM | POA: Insufficient documentation

## 2021-05-22 DIAGNOSIS — I428 Other cardiomyopathies: Secondary | ICD-10-CM | POA: Insufficient documentation

## 2021-05-22 DIAGNOSIS — Z6841 Body Mass Index (BMI) 40.0 and over, adult: Secondary | ICD-10-CM | POA: Insufficient documentation

## 2021-05-22 DIAGNOSIS — Z8249 Family history of ischemic heart disease and other diseases of the circulatory system: Secondary | ICD-10-CM | POA: Diagnosis not present

## 2021-05-22 DIAGNOSIS — Z793 Long term (current) use of hormonal contraceptives: Secondary | ICD-10-CM | POA: Diagnosis not present

## 2021-05-22 LAB — BASIC METABOLIC PANEL
Anion gap: 6 (ref 5–15)
BUN: 11 mg/dL (ref 6–20)
CO2: 25 mmol/L (ref 22–32)
Calcium: 9.4 mg/dL (ref 8.9–10.3)
Chloride: 103 mmol/L (ref 98–111)
Creatinine, Ser: 0.87 mg/dL (ref 0.44–1.00)
GFR, Estimated: >60 mL/min (ref >60)
Glucose, Bld: 334 mg/dL — ABNORMAL HIGH (ref 70–99)
Potassium: 4.1 mmol/L (ref 3.5–5.1)
Sodium: 134 mmol/L — ABNORMAL LOW (ref 135–145)

## 2021-05-22 LAB — DIGOXIN LEVEL: Digoxin Level: <0.2 ng/mL — ABNORMAL LOW (ref 0.8–2.0)

## 2021-05-22 NOTE — Patient Instructions (Addendum)
Labs were performed today, if any labs are abnormal the clinic will call you.  Your physician recommends that you schedule a follow-up appointment in: 2-6 weeks  At the Advanced [facility] Failure Clinic, you and your health needs are our priority. As part of our continuing mission to provide you with exceptional heart care, we have created designated Provider Care Teams. These Care Teams include your primary Cardiologist (physician) and Advanced Practice Providers (APPs- Physician Assistants and Nurse Practitioners) who all work together to provide you with the care you need, when you need it.   You may see any of the following providers on your designated Care Team at your next follow up: Dr Arvilla Meres Dr Marca Ancona Dr Brandon Melnick, NP Robbie Lis, [place] Mikki Santee Karle Plumber, PharmD   Please be sure to bring in all your medications bottles to every appointment.    If you have any questions or concerns before your next appointment please send [place] a message through [place] or call our office at [phone].    TO LEAVE A MESSAGE FOR THE NURSE SELECT OPTION 2, PLEASE LEAVE A MESSAGE INCLUDING: YOUR NAME DATE OF BIRTH CALL BACK NUMBER REASON FOR CALL**this is important as we prioritize the call backs  YOU WILL RECEIVE A CALL BACK THE SAME DAY AS LONG AS YOU CALL BEFORE 4:00 PM

## 2021-05-22 NOTE — Addendum Note (Signed)
Encounter addended by: Jacklynn Ganong, FNP on: 05/22/2021 9:22 PM  Actions taken: Home Medications modified, Medication taking status modified, Order Reconciliation Section accessed, Clinical Note Signed

## 2021-05-23 ENCOUNTER — Telehealth: Payer: Self-pay

## 2021-05-23 NOTE — Telephone Encounter (Signed)
-----   Message from Marcine Matar, MD sent at 05/17/2021  5:05 PM EDT ----- Follow-up in 2 months.

## 2021-05-23 NOTE — Telephone Encounter (Signed)
Pt has been scheduled and reminder has been mailed.  

## 2021-05-24 DIAGNOSIS — O903 Peripartum cardiomyopathy: Secondary | ICD-10-CM | POA: Diagnosis not present

## 2021-05-24 DIAGNOSIS — I5022 Chronic systolic (congestive) heart failure: Secondary | ICD-10-CM | POA: Diagnosis not present

## 2021-05-25 DIAGNOSIS — I272 Pulmonary hypertension, unspecified: Secondary | ICD-10-CM | POA: Diagnosis not present

## 2021-05-25 DIAGNOSIS — J45909 Unspecified asthma, uncomplicated: Secondary | ICD-10-CM | POA: Diagnosis not present

## 2021-05-25 DIAGNOSIS — E119 Type 2 diabetes mellitus without complications: Secondary | ICD-10-CM | POA: Diagnosis not present

## 2021-05-25 DIAGNOSIS — F17210 Nicotine dependence, cigarettes, uncomplicated: Secondary | ICD-10-CM | POA: Diagnosis not present

## 2021-05-25 DIAGNOSIS — Z452 Encounter for adjustment and management of vascular access device: Secondary | ICD-10-CM | POA: Diagnosis not present

## 2021-05-25 DIAGNOSIS — Z79899 Other long term (current) drug therapy: Secondary | ICD-10-CM | POA: Diagnosis not present

## 2021-05-25 DIAGNOSIS — I5022 Chronic systolic (congestive) heart failure: Secondary | ICD-10-CM | POA: Diagnosis not present

## 2021-05-25 DIAGNOSIS — O903 Peripartum cardiomyopathy: Secondary | ICD-10-CM | POA: Diagnosis not present

## 2021-05-25 DIAGNOSIS — G4733 Obstructive sleep apnea (adult) (pediatric): Secondary | ICD-10-CM | POA: Diagnosis not present

## 2021-05-25 DIAGNOSIS — Z7984 Long term (current) use of oral hypoglycemic drugs: Secondary | ICD-10-CM | POA: Diagnosis not present

## 2021-05-25 DIAGNOSIS — Z6841 Body Mass Index (BMI) 40.0 and over, adult: Secondary | ICD-10-CM | POA: Diagnosis not present

## 2021-05-25 DIAGNOSIS — I340 Nonrheumatic mitral (valve) insufficiency: Secondary | ICD-10-CM | POA: Diagnosis not present

## 2021-05-31 DIAGNOSIS — I5022 Chronic systolic (congestive) heart failure: Secondary | ICD-10-CM | POA: Diagnosis not present

## 2021-05-31 DIAGNOSIS — J45909 Unspecified asthma, uncomplicated: Secondary | ICD-10-CM | POA: Diagnosis not present

## 2021-05-31 DIAGNOSIS — G4733 Obstructive sleep apnea (adult) (pediatric): Secondary | ICD-10-CM | POA: Diagnosis not present

## 2021-05-31 DIAGNOSIS — E119 Type 2 diabetes mellitus without complications: Secondary | ICD-10-CM | POA: Diagnosis not present

## 2021-05-31 DIAGNOSIS — Z452 Encounter for adjustment and management of vascular access device: Secondary | ICD-10-CM | POA: Diagnosis not present

## 2021-05-31 DIAGNOSIS — Z79899 Other long term (current) drug therapy: Secondary | ICD-10-CM | POA: Diagnosis not present

## 2021-05-31 DIAGNOSIS — O903 Peripartum cardiomyopathy: Secondary | ICD-10-CM | POA: Diagnosis not present

## 2021-05-31 DIAGNOSIS — F17210 Nicotine dependence, cigarettes, uncomplicated: Secondary | ICD-10-CM | POA: Diagnosis not present

## 2021-05-31 DIAGNOSIS — Z6841 Body Mass Index (BMI) 40.0 and over, adult: Secondary | ICD-10-CM | POA: Diagnosis not present

## 2021-05-31 DIAGNOSIS — I340 Nonrheumatic mitral (valve) insufficiency: Secondary | ICD-10-CM | POA: Diagnosis not present

## 2021-05-31 DIAGNOSIS — Z7984 Long term (current) use of oral hypoglycemic drugs: Secondary | ICD-10-CM | POA: Diagnosis not present

## 2021-05-31 DIAGNOSIS — I272 Pulmonary hypertension, unspecified: Secondary | ICD-10-CM | POA: Diagnosis not present

## 2021-05-31 DIAGNOSIS — A5031 Late congenital syphilitic interstitial keratitis: Secondary | ICD-10-CM | POA: Diagnosis not present

## 2021-06-07 DIAGNOSIS — Z7984 Long term (current) use of oral hypoglycemic drugs: Secondary | ICD-10-CM | POA: Diagnosis not present

## 2021-06-07 DIAGNOSIS — I272 Pulmonary hypertension, unspecified: Secondary | ICD-10-CM | POA: Diagnosis not present

## 2021-06-07 DIAGNOSIS — J45909 Unspecified asthma, uncomplicated: Secondary | ICD-10-CM | POA: Diagnosis not present

## 2021-06-07 DIAGNOSIS — E119 Type 2 diabetes mellitus without complications: Secondary | ICD-10-CM | POA: Diagnosis not present

## 2021-06-07 DIAGNOSIS — F17210 Nicotine dependence, cigarettes, uncomplicated: Secondary | ICD-10-CM | POA: Diagnosis not present

## 2021-06-07 DIAGNOSIS — I5022 Chronic systolic (congestive) heart failure: Secondary | ICD-10-CM | POA: Diagnosis not present

## 2021-06-07 DIAGNOSIS — Z79899 Other long term (current) drug therapy: Secondary | ICD-10-CM | POA: Diagnosis not present

## 2021-06-07 DIAGNOSIS — I340 Nonrheumatic mitral (valve) insufficiency: Secondary | ICD-10-CM | POA: Diagnosis not present

## 2021-06-07 DIAGNOSIS — Z6841 Body Mass Index (BMI) 40.0 and over, adult: Secondary | ICD-10-CM | POA: Diagnosis not present

## 2021-06-07 DIAGNOSIS — Z452 Encounter for adjustment and management of vascular access device: Secondary | ICD-10-CM | POA: Diagnosis not present

## 2021-06-07 DIAGNOSIS — O903 Peripartum cardiomyopathy: Secondary | ICD-10-CM | POA: Diagnosis not present

## 2021-06-07 DIAGNOSIS — G4733 Obstructive sleep apnea (adult) (pediatric): Secondary | ICD-10-CM | POA: Diagnosis not present

## 2021-06-13 ENCOUNTER — Ambulatory Visit: Payer: Medicaid Other | Admitting: Obstetrics & Gynecology

## 2021-06-13 ENCOUNTER — Other Ambulatory Visit (HOSPITAL_COMMUNITY): Payer: Self-pay

## 2021-06-13 NOTE — Progress Notes (Signed)
Paramedicine Encounter    [name], female    DOB: 1981/05/01, 40 y.o.   MRN: [id number]   Patient Care Team: Ladell Pier, MD as PCP - General (Internal Medicine) Constance Haw, MD as PCP - Electrophysiology (Cardiology) Larey Dresser, MD as PCP - Advanced Heart Failure (Cardiology) Jorge Ny, LCSW as Social Worker (Licensed Clinical Social Worker)  Patient Active Problem List   Diagnosis Date Noted  . Trichomonal vaginitis 04/06/2021  . Heart failure ([place]) 01/18/2021  . Liletta IUD (intrauterine device) in place since 10/24/2020 10/24/2020  . Influenza vaccine refused 08/24/2020  . Anxiety and depression 08/24/2020  . 23-polyvalent pneumococcal polysaccharide vaccine declined 08/24/2020  . Tobacco dependence 08/24/2020  . Acute on chronic systolic CHF (congestive heart failure) ([place]) 06/17/2019  . Obstructive sleep apnea 06/17/2019  . 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 Diabetes mellitus ([place]) 06/17/2019  . Amenorrhea 08/03/2018  . Congestive heart failure ([place]) 06/29/2018  . Hypotension 06/07/2018  . Rhinovirus infection 06/06/2018  . Asthma 06/05/2018  . Hypokalemia 06/05/2018  . Snoring 09/23/2016  . Cough 04/10/2016  . NICM (nonischemic cardiomyopathy) ([place]) 01/25/2016  . Tobacco abuse 01/25/2016  . Acute on chronic combined systolic and diastolic CHF (congestive heart failure) ([place]) 01/25/2016  . Mitral regurgitation   . Abnormal EKG-inf TWI 01/15/2016  . Positive D dimer-CTA neg for PE 01/15/2016  . Morbid obesity- BMI 57 01/14/2016  . Abdominal pain 01/14/2016  . 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 At risk for sleep apnea 01/14/2016  . Cardiomyopathy ([place]) 11/01/2015  . Morbid obesity with BMI of 50.0-59.9, adult ([place]) 08/16/2015    Current Outpatient Medications:  .  Accu-Chek Softclix Lancets lancets, Use as instructed, Disp: 100 each, Rfl: 12 .  atorvastatin (LIPITOR) 10 MG tablet, TAKE 1  TABLET (10 MG TOTAL) BY MOUTH DAILY (NOON), Disp: 90 tablet, Rfl: 3 .  Blood Glucose Monitoring Suppl (ACCU-CHEK GUIDE) w/Device KIT, Use as directed, Disp: 1 kit, Rfl: 0 .  Blood Glucose Monitoring Suppl (TRUE METRIX METER) w/Device KIT, Use as directed, Disp: 1 kit, Rfl: 0 .  Blood Pressure Monitoring (3 SERIES BP MONITOR/UPPER ARM) DEVI, Check BP Daily, Disp: 1 each, Rfl: 0 .  carvedilol (COREG) 12.5 MG tablet, TAKE ONE TABLET BY MOUTH TWICE DAILY (AM+BEDTIME), Disp: 180 tablet, Rfl: 3 .  CORLANOR 7.5 MG TABS tablet, TAKE 1 TABLET BY MOUTH 2 (TWO) TIMES DAILY WITH A MEAL (AM+BEDTIME), Disp: 60 tablet, Rfl: 3 .  dapagliflozin propanediol (FARXIGA) 10 MG TABS tablet, Take 1 tablet (10 mg total) by mouth in the morning., Disp: 30 tablet, Rfl: 6 .  digoxin (LANOXIN) 0.125 MG tablet, TAKE 1 TABLET (0.125 MG TOTAL) BY MOUTH DAILY (NOON), Disp: 90 tablet, Rfl: 3 .  Dulaglutide (TRULICITY) 1.5 FB/5.1WC SOPN, Inject 1.5 mg into the skin once a week., Disp: 2 mL, Rfl: 6 .  glucose blood (ACCU-CHEK GUIDE) test strip, Check blood sugars daily before breakfast., Disp: 100 each, Rfl: 12 .  glucose blood (TRUE METRIX BLOOD GLUCOSE TEST) test strip, Use as instructed, Disp: 100 each, Rfl: 12 .  levonorgestrel (LILETTA, 52 MG,) 19.5 MCG/DAY IUD IUD, 1 each by Intrauterine route once., Disp: , Rfl:  .  magnesium oxide (MAG-OX) 400 (240 Mg) MG tablet, Take 1 tablet (400 mg total) by mouth 2 (two) times daily., Disp: 90 tablet, Rfl: 6 .  metolazone (ZAROXOLYN) 2.5 MG tablet, As directed by HF clinic (Patient taking differently: Take 2.5 mg by mouth once a  week. As directed by HF clinic), Disp: 10 tablet, Rfl: 3 .  milrinone (PRIMACOR) 20 MG/100 ML SOLN infusion, Inject 0.0414 mg/min into the vein continuous., Disp:  , Rfl:  .  potassium chloride (KLOR-CON) 10 MEQ tablet, TAKE 8 TABS IN THE MORNING, 6 TABS IN THE AFTERNOON, AND 6 TABS IN THE EVENING (Patient taking differently: Take 60-80 mEq by mouth See admin  instructions. Take 58mq in the morning, 677m in the afternoon, and 6042min the evening.), Disp: 600 tablet, Rfl: 11 .  sacubitril-valsartan (ENTRESTO) 97-103 MG, Take 1 tablet by mouth 2 (two) times daily., Disp: 60 tablet, Rfl: 6 .  spironolactone (ALDACTONE) 25 MG tablet, TAKE ONE TABLET BY MOUTH ONCE DAILY (NOON) (Patient taking differently: Take 25 mg by mouth daily at 12 noon.), Disp: 90 tablet, Rfl: 3 .  torsemide (DEMADEX) 100 MG tablet, TAKE ONE TABLET BY MOUTH TWICE DAILY (AM+NOON) (Patient taking differently: Take 100 mg by mouth 2 (two) times daily.), Disp: 60 tablet, Rfl: 11 .  valsartan (DIOVAN) 160 MG tablet, Take 1 tablet by mouth daily., Disp: , Rfl:  Allergies  Allergen Reactions  . Metformin Diarrhea    And caused severe "dry mouth," also  . Metformin And Related Diarrhea    And caused severe "dry mouth," also      Social History   Socioeconomic History  . Marital status: Single    Spouse name: Not on file  . Number of children: 2  . Years of education: Not on file  . Highest education level: Some college, no degree  Occupational History  . Occupation: Disability  Tobacco Use  . Smoking status: Former    Types: Cigarettes    Quit date: 07/18/2016    Years since quitting: 4.9  . Smokeless tobacco: Never  . Tobacco comments:    3 cigarettes per day  Vaping Use  . Vaping Use: Never used  Substance and Sexual Activity  . Alcohol use: Not Currently    Alcohol/week: 0.0 standard drinks    Comment: occasionally  . Drug use: Not Currently    Types: Marijuana    Comment: only socially  . Sexual activity: Yes    Birth control/protection: None  Other Topics Concern  . Not on file  Social History Narrative  . Not on file   Social Determinants of Health   Financial Resource Strain: Low Risk   . Difficulty of Paying Living Expenses: Not very hard  Food Insecurity: Food Insecurity Present  . Worried About RunCharity fundraiser the Last Year: Sometimes true   . Ran Out of Food in the Last Year: Sometimes true  Transportation Needs: Unmet Transportation Needs  . Lack of Transportation (Medical): No  . Lack of Transportation (Non-Medical): Yes  Physical Activity: Not on file  Stress: Not on file  Social Connections: Not on file  Intimate Partner Violence: Not on file    Physical Exam      Future Appointments  Date Time Provider DepLake Dalecarlia/28/2022  2:35 PM AnyTamme, MozingoD [facility]/25/2022  3:20 PM McLLarey DresserD [facility] None  07/23/2021  8:35 AM [facility] DEVICE REMOTES [facility]  07/30/2021  3:10 PM JohLadell PierD [facility] None  10/22/2021  8:35 AM [facility] DEVICE REMOTES [facility]  01/21/2022  8:35 AM [facility] DEVICE REMOTES [facility]    BP (!) 82/0   Pulse 77   Resp 20   Wt (!) 360 lb (163.3 kg)  SpO2 97%   BMI 59.91 kg/m   Weight yesterday-360 Last visit weight-366 @ clinic   Pt is now in her new apt. She is in a 2 story apt. Her  bedroom is upstairs. She does not have much furniture in here. Her mattress is on the floor. She doesn't have living room furniture, or kitchen tables.  She was suppose to call DSS to fax over letter to [place]. But pt reports she has not done what she was asked so to get the referral for assistance for furniture.  She likes it over here. She says it is quiet.  She is doing ok with going up and down the stairs.  She reports having all her meds.  She reports CBG's in the 100s.  She will see dr Aundra Dubin in clinic at end of oct. She is not eating, poor appetite. She has to force herself to eat. She reports this is an ongoing issue for a very long time.  She is not going to the gym. She does not have a membership to go.  Her mood seems down but she said she is tired and hasnt been sleeping yet.  She was no show to sleep study-I told her to call them back to resch.  Pt denies dizziness, no c/p. Pts b/p is on the slower  side.  She has not been exercising at all.    [name], [facility] Paramedic  06/13/21

## 2021-06-14 ENCOUNTER — Telehealth (HOSPITAL_COMMUNITY): Payer: Self-pay

## 2021-06-14 ENCOUNTER — Telehealth (HOSPITAL_COMMUNITY): Payer: Self-pay | Admitting: Licensed Clinical Social Worker

## 2021-06-14 NOTE — Telephone Encounter (Signed)
Heather Roberts with HH called to report that she has been trying to contact patient all day with no success. Patient was suppose to be seen today so they could draw labs and do a bag & dressing change and to recert her as a patient or they would have to discharge her from their services. Lynden Ang reports that Janina opted for them to discharge her, stating that she would come to our office for her weekly labs and that she does her own picc line dressing and bag changes which the nurse stated was untrue.   CB# [phone]

## 2021-06-14 NOTE — Telephone Encounter (Signed)
Pt has moved into a new home at this time and wasn't able to take a large amount of her belongings with her- some of it was damaged and some of it was too big to carry with them.  CSW assisted with completing [facility] referral to help get pt new furniture.  Pt aware she might not get everything she requested as her last referral to them was within the last 5 years.  Will continue to follow and assist as needed  Burna Sis, LCSW Clinical Social Worker [facility] Desk#: [phone] Cell#: [phone]

## 2021-06-15 ENCOUNTER — Other Ambulatory Visit (HOSPITAL_COMMUNITY): Payer: Self-pay | Admitting: Cardiology

## 2021-06-15 NOTE — Telephone Encounter (Signed)
Would let her know that if she does not work with home health she will have to stop milrinone.

## 2021-06-19 ENCOUNTER — Telehealth (HOSPITAL_COMMUNITY): Payer: Self-pay | Admitting: Licensed Clinical Social Worker

## 2021-06-19 NOTE — Telephone Encounter (Signed)
Referral fee for [facility] paid- they will reach out to pt to set up appt to come shopping- pt aware  Will continue to follow and assist as needed  Burna Sis, LCSW Clinical Social Worker [facility] Desk#: [phone] Cell#: [phone]

## 2021-06-20 ENCOUNTER — Telehealth (HOSPITAL_COMMUNITY): Payer: Self-pay

## 2021-06-20 NOTE — Telephone Encounter (Signed)
I spoke with Lynden Ang with [facility] she stated pt was not home for their scheduled visit last week. Pt said she had an emergency and would be right back. RN continued to call pt and pt did not answer. Pt finally answered and said she changed her own bag and did not need to see RN. Per RN pt has 1/2 empty milrinone bags and stated she is getting an LVAD soon and knows how to do her own dressing changes and bag changes. Pt no longer wants to see [facility]. Lynden Ang is the only RN that can service her area and provide milrinone care per director of [facility] pt has been officially discharged. I spoke with Dr.McLean he said pt can not have outpatient milrinone without homehealth pt needs an appt to discuss removal of PICC line and d/c milrinone. I called pt to schedule appt pt said she is not ready to stop milrinone and asked that I find another agency to take her. I called [facility] and was abruptly told no they would not accept her she was discharged for non compliance with them as well.  Pt was advised of risks of infection by keeping line without proper/professional care and encouraged to schedule an office visit. Pt has office visit 10/25 but needs to be seen ASAP. Per Lynden Ang pts line has not been attended to by RN in 2 weeks. I spoke with Dinah Beers who advised we speak with social worker Rosetta Posner to follow up with pt.  Eileen Stanford is out of the office today will follow up tomorrow.

## 2021-06-20 NOTE — Telephone Encounter (Signed)
Pt reached out to me reporting that her nurse has advised her that she has been dropped and no more home visits are going to be made.  Pt began to tell me that her home nurse has changed and she hurts when she does the blood draws and she asked for someone else to come out and that there was some confusion on a day that nurse was there but pt was going to be seen in clinic and pt advised her that she would rather wait and let clinic do the labs for that particular day.  Eileen Stanford is not working today so I will speak to her about things tomor.   Kerry Hough, EMT-Paramedic  06/20/21

## 2021-06-20 NOTE — Telephone Encounter (Signed)
Jocilyn reports that she never told cathy that she didn't want home health anymore, she didn't want cathy to come back out there because she feels as if cathy is rude. Lesa also reports that she told cathy to start coming on wednesdays because that's when her bag runs out but states that cathy continues to try to come on thursdays therefore Sarann changes her bag herself.

## 2021-06-21 ENCOUNTER — Other Ambulatory Visit (HOSPITAL_COMMUNITY): Payer: Self-pay | Admitting: Adult Health

## 2021-06-21 ENCOUNTER — Encounter (HOSPITAL_COMMUNITY): Payer: Self-pay

## 2021-06-21 ENCOUNTER — Other Ambulatory Visit: Payer: Self-pay

## 2021-06-21 ENCOUNTER — Ambulatory Visit (HOSPITAL_COMMUNITY)
Admission: RE | Admit: 2021-06-21 | Discharge: 2021-06-21 | Disposition: A | Discharge status: Home / Self Care | Payer: Medicaid Other | Source: Ambulatory Visit | Attending: Adult Health | Admitting: Adult Health

## 2021-06-21 VITALS — BP 102/78 | HR 86 | Wt 366.4 lb

## 2021-06-21 DIAGNOSIS — E119 Type 2 diabetes mellitus without complications: Secondary | ICD-10-CM | POA: Insufficient documentation

## 2021-06-21 DIAGNOSIS — Z95810 Presence of automatic (implantable) cardiac defibrillator: Secondary | ICD-10-CM | POA: Diagnosis not present

## 2021-06-21 DIAGNOSIS — Z7985 Long-term (current) use of injectable non-insulin antidiabetic drugs: Secondary | ICD-10-CM | POA: Diagnosis not present

## 2021-06-21 DIAGNOSIS — Z79899 Other long term (current) drug therapy: Secondary | ICD-10-CM | POA: Diagnosis not present

## 2021-06-21 DIAGNOSIS — Z793 Long term (current) use of hormonal contraceptives: Secondary | ICD-10-CM | POA: Diagnosis not present

## 2021-06-21 DIAGNOSIS — I5022 Chronic systolic (congestive) heart failure: Secondary | ICD-10-CM | POA: Diagnosis not present

## 2021-06-21 DIAGNOSIS — G4733 Obstructive sleep apnea (adult) (pediatric): Secondary | ICD-10-CM | POA: Insufficient documentation

## 2021-06-21 DIAGNOSIS — Z8249 Family history of ischemic heart disease and other diseases of the circulatory system: Secondary | ICD-10-CM | POA: Insufficient documentation

## 2021-06-21 DIAGNOSIS — I428 Other cardiomyopathies: Secondary | ICD-10-CM | POA: Insufficient documentation

## 2021-06-21 DIAGNOSIS — Z87891 Personal history of nicotine dependence: Secondary | ICD-10-CM | POA: Insufficient documentation

## 2021-06-21 DIAGNOSIS — Z6841 Body Mass Index (BMI) 40.0 and over, adult: Secondary | ICD-10-CM | POA: Diagnosis not present

## 2021-06-21 DIAGNOSIS — Z7984 Long term (current) use of oral hypoglycemic drugs: Secondary | ICD-10-CM | POA: Insufficient documentation

## 2021-06-21 DIAGNOSIS — Z833 Family history of diabetes mellitus: Secondary | ICD-10-CM | POA: Diagnosis not present

## 2021-06-21 DIAGNOSIS — Z888 Allergy status to other drugs, medicaments and biological substances status: Secondary | ICD-10-CM | POA: Diagnosis not present

## 2021-06-21 DIAGNOSIS — I509 Heart failure, unspecified: Secondary | ICD-10-CM

## 2021-06-21 LAB — BASIC METABOLIC PANEL
Anion gap: 9 (ref 5–15)
BUN: 10 mg/dL (ref 6–20)
CO2: 29 mmol/L (ref 22–32)
Calcium: 9.2 mg/dL (ref 8.9–10.3)
Chloride: 100 mmol/L (ref 98–111)
Creatinine, Ser: 0.81 mg/dL (ref 0.44–1.00)
GFR, Estimated: >60 mL/min (ref >60)
Glucose, Bld: 200 mg/dL — ABNORMAL HIGH (ref 70–99)
Potassium: 3.4 mmol/L — ABNORMAL LOW (ref 3.5–5.1)
Sodium: 138 mmol/L (ref 135–145)

## 2021-06-21 LAB — CBC
HCT: 44.4 % (ref 36.0–46.0)
Hemoglobin: 15.6 g/dL — ABNORMAL HIGH (ref 12.0–15.0)
MCH: 27.8 pg (ref 26.0–34.0)
MCHC: 35.1 g/dL (ref 30.0–36.0)
MCV: 79 fL — ABNORMAL LOW (ref 80.0–100.0)
Platelets: 249 10*3/uL (ref 150–400)
RBC: 5.62 MIL/uL — ABNORMAL HIGH (ref 3.87–5.11)
RDW: 13.7 % (ref 11.5–15.5)
WBC: 8.6 10*3/uL (ref 4.0–10.5)
nRBC: 0 % (ref 0.0–0.2)

## 2021-06-21 LAB — MAGNESIUM: Magnesium: 1.7 mg/dL (ref 1.7–2.4)

## 2021-06-21 NOTE — Patient Instructions (Addendum)
Labs were done today, if any labs are abnormal the clinic will call you  STOP Milrinone   You have been scheduled an appointment with Radiology to get PICC line removed please be there at 2:30pm  Keep your follow up appointment with Dr. Shirlee Latch  At the [facility], you and your health needs are our priority. As part of our continuing mission to provide you with exceptional heart care, we have created designated Provider Care Teams. These Care Teams include your primary Cardiologist (physician) and Advanced Practice Providers (APPs- Physician Assistants and Nurse Practitioners) who all work together to provide you with the care you need, when you need it.   You may see any of the following providers on your designated Care Team at your next follow up: Dr Arvilla Meres Dr Marca Ancona Dr Brandon Melnick, NP Robbie Lis, [place] Mikki Santee Karle Plumber, PharmD   Please be sure to bring in all your medications bottles to every appointment.    If you have any questions or concerns before your next appointment please send [place] a message through [place] or call our office at [phone].    TO LEAVE A MESSAGE FOR THE NURSE SELECT OPTION 2, PLEASE LEAVE A MESSAGE INCLUDING: YOUR NAME DATE OF BIRTH CALL BACK NUMBER REASON FOR CALL**this is important as we prioritize the call backs  YOU WILL RECEIVE A CALL BACK THE SAME DAY AS LONG AS YOU CALL BEFORE 4:00 PM

## 2021-06-21 NOTE — Progress Notes (Signed)
[facility] Note   PCP: Ladell Pier, MD PCP-Cardiologist: Dr. Ronney Lion GYN : Dr Norman Herrlich.   HPI: 40 y.o. 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   She presented to the ER on 06/17/19 with several days of worsening HF with SOB at rest, marked edema and orthopnea/PND, not responding to increased doses of oral diuretics. She admitted to high levels of water and Gatorade intake. Her admit wt was 380 lb (Dry wt ~360 lb). She was started on IV Lasix but had initial poor urinary response. PO metolazone added as adjunct. Echo showed  EF < 20%, relatively normal RV, moderate MR. [place] 06/21/19 with low output (CI 1.8) and elevated PCWP but near normal RA pressure. She was started on inotropic therapy w/ milrinone for low output, 0.25 mcg/kg/min. Also treated w/ Entresto, Coreg, spironolactone, digoxin and ivabradine. She was later transitioned off of IV Lasix to po torsemide 80 mg qam/60 mg qpm.  Unfortunately, she was not a transplant candidate due to her morbid obesity. We discussed her at [facility] with Dr. Prescott Gum.  She will need significant weight loss to get to LVAD. Will use home milrinone to try to facilitate increased activity to work towards weight loss.  She understands that milrinone is not a good end-point and that we would like to use it as a bridge to eventual LVAD. RV function seems adequate for LVAD. Tunneled catheter was placed for home milrinone and home health was arranged. She was discharged home on 06/25/19 on milrinone 0.25 mcg/kg/min.   06/27/19 she presented to the [facility] w/ CC of palpitations. Was brought in by EMS and was reportedly in SVT with HR in the 180s. She was given adenosine and converted to NSR prior to arrival to the ED.  PICC exchanged 10/15/19  She now has an IUD for contraception (had a terminated pregnancy over the winter).    01/17/21 went to ED for PICC line displacement. IR  consulted and tunneled PICC was replaced.  Echo (5/22) EF 20-25%. Milrinone weaned to 0.125 mcg.  She has been fired from [employer]. Most consistent with home environment and noncompliance. On 06/14/21. [facility] stated pt was not home for their scheduled visit last week. Pt said she had an emergency and would be right back. RN continued to call pt and pt did not answer. Pt finally answered and said she changed her own bag and did not need to see RN. Per RN pt has 1/2 empty milrinone bags  On 06/20/21 she was informed that she has no additional [place] visit. Medicaid does not cover home Milrinone.    Today she returns for HF follow up with her sister. Her sister says she has been on an off milrinone for the last few months and even ran out of the milrinone while in [place]. Overall feeling fine but anxious about stopping Milrinone.  Denies SOB.  Able to walk up and down steps. Appetite ok. No fever or chills. She has not been weighing at home. Using bubble packs for medications. Followed by HF Paramedicine.   PMH:  1. Chronic systolic CHF: Nonischemic cardiomyopathy.  No family history of cardiomyopathy, no heavy ETOH, cocaine, or amphetamines.  Medtronic ICD.  - Echo (12/16) with EF 20-25%, moderate MR.  - Echo (5/17) with severely dilated LV, EF 10-15%, severe functional MR, mildly decreased RV systolic function. - LHC/RHC (5/17): No significant coronary disease;  mean RA 15, PA 61/25 mean 41, mean PCWP 18, CI 1.7 (Fick), PVR 5.4 WU.  - Echo (10/17): EF 20%, severely dilated LV, normal RV size with mildly decreased systolic function, moderate-severe central MR.  - Echo 04/13/17  LVEF 15-20%, Severe LV dilation, Mod LAE, Mild RAE - CPX (5/19): RER 0.95, peak VO2 9.8, VE/VCO2 26 => submaximal but likely severe functional impairement.  - Echo (7/19): EF 20-25%, moderate LV dilation, RV normal size with mildly decreased systolic function, moderate central MR.  - Echo (10/20): EF < 20%, severe LV  enlargement, normal RV>  - RHC (10/20): mean RA 7, PA 65/25, mean PCWP 27, CI 1.8, PVR 3.87, PAPI 5.7.  2. Morbid obesity.  3. 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  Echo (10/17) with moderate-severe MR. Echo (7/19) with moderate central MR.  4. OSA: CPAP.  5. SVT  Current Outpatient Medications  Medication Sig Dispense Refill   Accu-Chek Softclix Lancets lancets Use as instructed 100 each 12   atorvastatin (LIPITOR) 10 MG tablet TAKE 1 TABLET (10 MG TOTAL) BY MOUTH DAILY (NOON) 90 tablet 3   Blood Glucose Monitoring Suppl (ACCU-CHEK GUIDE) w/Device KIT Use as directed 1 kit 0   Blood Glucose Monitoring Suppl (TRUE METRIX METER) w/Device KIT Use as directed 1 kit 0   Blood Pressure Monitoring (3 SERIES BP MONITOR/UPPER ARM) DEVI Check BP Daily 1 each 0   carvedilol (COREG) 12.5 MG tablet TAKE ONE TABLET BY MOUTH TWICE DAILY (AM+BEDTIME) 180 tablet 3   CORLANOR 7.5 MG TABS tablet TAKE 1 TABLET BY MOUTH 2 (TWO) TIMES DAILY WITH A MEAL (AM+BEDTIME) 60 tablet 3   dapagliflozin propanediol (FARXIGA) 10 MG TABS tablet Take 1 tablet (10 mg total) by mouth in the morning. 30 tablet 6   digoxin (LANOXIN) 0.125 MG tablet TAKE 1 TABLET (0.125 MG TOTAL) BY MOUTH DAILY (NOON) 90 tablet 3   Dulaglutide (TRULICITY) 1.5 YD/7.4JO SOPN Inject 1.5 mg into the skin once a week. 2 mL 6   glucose blood (ACCU-CHEK GUIDE) test strip Check blood sugars daily before breakfast. 100 each 12   glucose blood (TRUE METRIX BLOOD GLUCOSE TEST) test strip Use as instructed 100 each 12   levonorgestrel (LILETTA, 52 MG,) 19.5 MCG/DAY IUD IUD 1 each by Intrauterine route once.     magnesium oxide (MAG-OX) 400 (240 Mg) MG tablet Take 1 tablet (400 mg total) by mouth 2 (two) times daily. 90 tablet 6   metolazone (ZAROXOLYN) 2.5 MG tablet TAKE ONLY AS DIRECTED BY THE CHF CLINIC [facility] MORNING) 10 tablet 3   milrinone (PRIMACOR) 20 MG/100 ML SOLN infusion Inject 0.0414 mg/min into the vein continuous.      potassium chloride (KLOR-CON) 10 MEQ tablet TAKE 8 TABS IN THE MORNING, 6 TABS IN THE AFTERNOON, AND 6 TABS IN THE EVENING (Patient taking differently: Take 60-80 mEq by mouth See admin instructions. Take 34mq in the morning, 636m in the afternoon, and 6029min the evening.) 600 tablet 11   sacubitril-valsartan (ENTRESTO) 97-103 MG Take 1 tablet by mouth 2 (two) times daily. 60 tablet 6   spironolactone (ALDACTONE) 25 MG tablet TAKE ONE TABLET BY MOUTH ONCE DAILY (NOON) 90 tablet 3   torsemide (DEMADEX) 100 MG tablet TAKE ONE TABLET BY MOUTH TWICE DAILY (AM+NOON) (Patient taking differently: Take 100 mg by mouth 2 (two) times daily.) 60 tablet 11   valsartan (DIOVAN) 160 MG tablet Take 1 tablet by mouth daily.     No current facility-administered medications for  this encounter.   Allergies  Allergen Reactions   Metformin Diarrhea    And caused severe "dry mouth," also   Metformin And Related Diarrhea    And caused severe "dry mouth," also   Social History   Socioeconomic History   Marital status: Single    Spouse name: Not on file   Number of children: 2   Years of education: Not on file   Highest education level: Some college, no degree  Occupational History   Occupation: Disability  Tobacco Use   Smoking status: Former    Types: Cigarettes    Quit date: 07/18/2016    Years since quitting: 4.9   Smokeless tobacco: Never   Tobacco comments:    3 cigarettes per day  Vaping Use   Vaping Use: Never used  Substance and Sexual Activity   Alcohol use: Not Currently    Alcohol/week: 0.0 standard drinks    Comment: occasionally   Drug use: Not Currently    Types: Marijuana    Comment: only socially   Sexual activity: Yes    Birth control/protection: None  Other Topics Concern   Not on file  Social History Narrative   Not on file   Social Determinants of Health   Financial Resource Strain: Low Risk    Difficulty of Paying Living Expenses: Not very hard  Food Insecurity:  Landscape architect Present   Worried About Charity fundraiser in the Last Year: Sometimes true   Arboriculturist in the Last Year: Sometimes true  Transportation Needs: Unmet Transportation Needs   Lack of Transportation (Medical): No   Lack of Transportation (Non-Medical): Yes  Physical Activity: Not on file  Stress: Not on file  Social Connections: Not on file  Intimate Partner Violence: Not on file   Family History  Problem Relation Age of Onset   Heart attack Mother    Asthma Mother    Heart failure Mother    Hypertension Father    Diabetes Brother    BP 102/78   Pulse 86   Wt (!) 166.2 kg (366 lb 6.4 oz)   SpO2 97%   BMI 60.97 kg/m   Wt Readings from Last 3 Encounters:  06/21/21 (!) 166.2 kg (366 lb 6.4 oz)  06/13/21 (!) 163.3 kg (360 lb)  05/22/21 (!) 166 kg (366 lb)   PHYSICAL EXAM: General:  Well appearing. No resp difficulty HEENT: normal Neck: supple. no JVD. Carotids 2+ bilat; no bruits. No lymphadenopathy or thryomegaly appreciated. Cor: PMI nondisplaced. Regular rate & rhythm. No rubs, gallops or murmurs. R upper chest tunneled PICC with skin irritation noted.  Lungs: clear Abdomen: obese, soft, nontender, nondistended. No hepatosplenomegaly. No bruits or masses. Good bowel sounds. Extremities: no cyanosis, clubbing, rash, edema Neuro: alert & orientedx3, cranial nerves grossly intact. moves all 4 extremities w/o difficulty. Affect pleasant  ASSESSMENT & PLAN: 1. Chronic systolic CHF:  Nonischemic cardiomyopathy.  Body habitus precludes cardiac MRI.  Echo 06/2019 with EF < 20%, relatively normal RV, moderate MR.  [place] 06/21/19 with low output (CI 1.8) and elevated PCWP but near normal RA pressure, started on milrinone 0.25 with plans to continue home milrinone as bridge to possible LVAD if she could achieve significant weight loss.  Repeat echo 5/22 with EF 20-25%. Unfortunately, she has not succeeded in losing much wt.  - NYHA II. Volume status stable.  Continue  torsemide 100 mg bid + metolazone once weekly q Wed.  - Continue Coreg 12.5 mg bid.  -  Continue Entresto 97/103 mg bid. - Continue ivabradine 7.5 mg bid.  - Continue spironolactone 25 mg daily.  - Continue digoxin 0.125 mg.  - CShe has been on milrinone for 1.5 years, really not making progress in weight loss so still not a candidate for transplant or LVAD. She has been fired by all [place] for various reasonsontinue Farxiga 10 mg daily.  -  No longer eligible for home milrinone. We discussed that home health is no longer an option and that we need to remove the PICC line ASAP to avoid infection. Over the last few months she has been on and off milrinone when her batteries have run out and when she has run out milrinone. She is on the lowest dose of milrinone so we will stop and remove tunneled PICC. IR contacted and will remove 06/22/21. - We discussed she that she needs to take her HF medications. Continue HF Paramedicine.   - She needs to prevent pregnancy. She now has an IUD. 2. Morbid obesity:  Body mass index is 60.97 kg/m. - Referred to PREP.  -3. SVT: had ED 2020 SVT noted by EMS that converted back to NSR after dose of adenosine. This was in the setting of hypokalemia and hypomagnesemia, at 3.0 and 1.5 respectively. Also in the setting of inotropic therapy w/ milrinone.  No AT/AF on device interrogation today. 4. OSA: sleep study has been re-ordered by PCP. She has not completed this. Needs to get another study.  5. DMII: On Farxiga + Trulicity.    Check CBC, BMET, Mag   Greater than 50% of the (total minutes 45) visit spent in counseling/coordination of care regarding the plan and to stop milrinone and remove PICC. She has not had nursing services for 2 weeks. On 06/14/21 she was not answering her phone. She was discharged [facility]. Lengthy discussion with her sister regarding her care over the last few years. We will see how things go at home with oral medications. If needed can refer to Hospice  but hopefully she will not need that.   Follow up in few weeks. HFSW discussed and to determine a plan. HFSW appreciated with valuable input to Ahmari and her sister.   Darrick Grinder, NP 06/21/21

## 2021-06-21 NOTE — Progress Notes (Signed)
CSW met with patient and sister during provider visit. Clinic staff informed that patient is no longer able to have Home health services to care for milrinone in the community. Patient shared concerns about stopping but after lengthy conversation with provider felt more comfortable and verbalized understanding of the necessity to remove PICC and stop medication. Discussed the importance of medication compliance, exercise and weight loss for improved health. Patient scheduled for PICC removal tomorrow and CSW will provide support as needed. CSW provided supportive intervention and will continue to follow and coordinate with [employer] and provider as needed. Raquel Sarna, [place], [place]

## 2021-06-21 NOTE — Addendum Note (Signed)
Encounter addended by: Marcy Siren, LCSW on: 06/21/2021 5:04 PM  Actions taken: Clinical Note Signed

## 2021-06-22 ENCOUNTER — Other Ambulatory Visit (HOSPITAL_COMMUNITY): Payer: Medicaid Other

## 2021-06-22 ENCOUNTER — Ambulatory Visit (HOSPITAL_COMMUNITY)
Admission: RE | Admit: 2021-06-22 | Discharge: 2021-06-22 | Disposition: A | Discharge status: Home / Self Care | Payer: Medicaid Other | Source: Ambulatory Visit | Attending: Adult Health | Admitting: Adult Health

## 2021-06-22 DIAGNOSIS — I509 Heart failure, unspecified: Secondary | ICD-10-CM | POA: Diagnosis not present

## 2021-06-22 DIAGNOSIS — Z452 Encounter for adjustment and management of vascular access device: Secondary | ICD-10-CM | POA: Insufficient documentation

## 2021-06-22 HISTORY — PX: IR REMOVAL TUN CV CATH W/O FL: IMG2289

## 2021-06-22 MED ORDER — LIDOCAINE HCL 1 % IJ SOLN
INTRAMUSCULAR | Status: AC
  Filled 2021-06-22: qty 20

## 2021-06-22 NOTE — Procedures (Signed)
Tunneled central line removed per orders. Please see full dictation under imaging section in Epic.  Alwyn Ren, [place] [phone] 06/22/2021, 3:05 PM

## 2021-06-28 ENCOUNTER — Encounter (HOSPITAL_COMMUNITY): Payer: Medicaid Other

## 2021-07-04 ENCOUNTER — Telehealth (HOSPITAL_COMMUNITY): Payer: Self-pay

## 2021-07-04 NOTE — Telephone Encounter (Signed)
Pt reports that her and the girls woke up not feeling well with h/a, runny nose, sore throat, coughing so she is going to take everyone to the urgent care.  She denies any need for anything right now, other than what mentioned above she has been doing ok.   Will f/u next week.   Kerry Hough, EMT-Paramedic  07/04/21

## 2021-07-09 ENCOUNTER — Telehealth (HOSPITAL_COMMUNITY): Payer: Self-pay

## 2021-07-10 ENCOUNTER — Encounter (HOSPITAL_COMMUNITY): Payer: Self-pay | Admitting: Cardiology

## 2021-07-10 ENCOUNTER — Other Ambulatory Visit: Payer: Self-pay

## 2021-07-10 ENCOUNTER — Ambulatory Visit (HOSPITAL_COMMUNITY)
Admission: RE | Admit: 2021-07-10 | Discharge: 2021-07-10 | Disposition: A | Discharge status: Home / Self Care | Payer: Medicaid Other | Source: Ambulatory Visit | Attending: Cardiology | Admitting: Cardiology

## 2021-07-10 DIAGNOSIS — Z79899 Other long term (current) drug therapy: Secondary | ICD-10-CM | POA: Insufficient documentation

## 2021-07-10 DIAGNOSIS — Z91119 Patient's noncompliance with dietary regimen due to unspecified reason: Secondary | ICD-10-CM | POA: Diagnosis not present

## 2021-07-10 DIAGNOSIS — G4730 Sleep apnea, unspecified: Secondary | ICD-10-CM

## 2021-07-10 DIAGNOSIS — Z8249 Family history of ischemic heart disease and other diseases of the circulatory system: Secondary | ICD-10-CM | POA: Diagnosis not present

## 2021-07-10 DIAGNOSIS — Z7985 Long-term (current) use of injectable non-insulin antidiabetic drugs: Secondary | ICD-10-CM | POA: Insufficient documentation

## 2021-07-10 DIAGNOSIS — G4733 Obstructive sleep apnea (adult) (pediatric): Secondary | ICD-10-CM | POA: Diagnosis not present

## 2021-07-10 DIAGNOSIS — Z5982 Transportation insecurity: Secondary | ICD-10-CM | POA: Diagnosis not present

## 2021-07-10 DIAGNOSIS — I340 Nonrheumatic mitral (valve) insufficiency: Secondary | ICD-10-CM | POA: Diagnosis not present

## 2021-07-10 DIAGNOSIS — Z87891 Personal history of nicotine dependence: Secondary | ICD-10-CM | POA: Insufficient documentation

## 2021-07-10 DIAGNOSIS — Z5941 Food insecurity: Secondary | ICD-10-CM | POA: Diagnosis not present

## 2021-07-10 DIAGNOSIS — Z7984 Long term (current) use of oral hypoglycemic drugs: Secondary | ICD-10-CM | POA: Diagnosis not present

## 2021-07-10 DIAGNOSIS — I5042 Chronic combined systolic (congestive) and diastolic (congestive) heart failure: Secondary | ICD-10-CM

## 2021-07-10 DIAGNOSIS — Z888 Allergy status to other drugs, medicaments and biological substances status: Secondary | ICD-10-CM | POA: Insufficient documentation

## 2021-07-10 DIAGNOSIS — I4892 Unspecified atrial flutter: Secondary | ICD-10-CM | POA: Diagnosis not present

## 2021-07-10 DIAGNOSIS — O24119 Pre-existing diabetes mellitus, type 2, in pregnancy, unspecified trimester: Secondary | ICD-10-CM | POA: Insufficient documentation

## 2021-07-10 DIAGNOSIS — I428 Other cardiomyopathies: Secondary | ICD-10-CM | POA: Diagnosis not present

## 2021-07-10 DIAGNOSIS — Z975 Presence of (intrauterine) contraceptive device: Secondary | ICD-10-CM | POA: Diagnosis not present

## 2021-07-10 DIAGNOSIS — Z95810 Presence of automatic (implantable) cardiac defibrillator: Secondary | ICD-10-CM | POA: Diagnosis not present

## 2021-07-10 DIAGNOSIS — I5022 Chronic systolic (congestive) heart failure: Secondary | ICD-10-CM | POA: Insufficient documentation

## 2021-07-10 LAB — BASIC METABOLIC PANEL
Anion gap: 7 (ref 5–15)
BUN: 10 mg/dL (ref 6–20)
CO2: 27 mmol/L (ref 22–32)
Calcium: 9.3 mg/dL (ref 8.9–10.3)
Chloride: 99 mmol/L (ref 98–111)
Creatinine, Ser: 0.76 mg/dL (ref 0.44–1.00)
GFR, Estimated: >60 mL/min (ref >60)
Glucose, Bld: 221 mg/dL — ABNORMAL HIGH (ref 70–99)
Potassium: 3.7 mmol/L (ref 3.5–5.1)
Sodium: 133 mmol/L — ABNORMAL LOW (ref 135–145)

## 2021-07-10 LAB — DIGOXIN LEVEL: Digoxin Level: 0.3 ng/mL — ABNORMAL LOW (ref 0.8–2.0)

## 2021-07-10 MED ORDER — APIXABAN 5 MG PO TABS: 5.0000 mg | ORAL_TABLET | Freq: Two times a day (BID) | ORAL | 11 refills | Status: DC

## 2021-07-10 NOTE — Patient Instructions (Addendum)
EKG done today.  Labs done today. We will contact you only if your labs are abnormal.  START Eliquis 5mg  (1 tablet) by mouth 2 times daily.  STOP taking Valsartan   No other medication changes were made. Please continue all current medications as prescribed.  Your physician has recommended that you have a sleep study. This test records several body functions during sleep, including: brain activity, eye movement, oxygen and carbon dioxide blood levels, heart rate and rhythm, breathing rate and rhythm, the flow of air through your mouth and nose, snoring, body muscle movements, and chest and belly movement. The sleep center will contact you to schedule an appointment.   You have been referred to the Pharmacy Clinic for [facility]. They will contact you to schedule an appointment.   Your physician recommends that you schedule a follow-up appointment in: 1 month with our NP/PA Clinic here in our office   If you have any questions or concerns before your next appointment please send [facility] a message through [place] or call our office at [phone].    TO LEAVE A MESSAGE FOR THE NURSE SELECT OPTION 2, PLEASE LEAVE A MESSAGE INCLUDING: YOUR NAME DATE OF BIRTH CALL BACK NUMBER REASON FOR CALL**this is important as we prioritize the call backs  YOU WILL RECEIVE A CALL BACK THE SAME DAY AS LONG AS YOU CALL BEFORE 4:00 PM   Do the following things EVERYDAY: Weigh yourself in the morning before breakfast. Write it down and keep it in a log. Take your medicines as prescribed Eat low salt foods--Limit salt (sodium) to 2000 mg per day.  Stay as active as you can everyday Limit all fluids for the day to less than 2 liters   At the [facility], you and your health needs are our priority. As part of our continuing mission to provide you with exceptional heart care, we have created designated Provider Care Teams. These Care Teams include your primary Cardiologist (physician) and  Advanced Practice Providers (APPs- Physician Assistants and Nurse Practitioners) who all work together to provide you with the care you need, when you need it.   You may see any of the following providers on your designated Care Team at your next follow up: Dr [phone] Dr Arvilla Meres, NP Carron Curie, Robbie Lis [place], PharmD   Please be sure to bring in all your medications bottles to every appointment.

## 2021-07-11 ENCOUNTER — Ambulatory Visit (INDEPENDENT_AMBULATORY_CARE_PROVIDER_SITE_OTHER): Payer: Medicaid Other

## 2021-07-11 ENCOUNTER — Telehealth (HOSPITAL_COMMUNITY): Payer: Self-pay | Admitting: Licensed Clinical Social Worker

## 2021-07-11 DIAGNOSIS — I5042 Chronic combined systolic (congestive) and diastolic (congestive) heart failure: Secondary | ICD-10-CM | POA: Diagnosis not present

## 2021-07-11 LAB — CUP PACEART REMOTE DEVICE CHECK
Battery Remaining Longevity: 82 mo
Battery Voltage: 2.99 V
Brady Statistic RV Percent Paced: 0 %
Date Time Interrogation Session: 20221025153441
HighPow Impedance: 69 Ohm
Implantable Lead Implant Date: 20180802
Implantable Lead Location: 753860
Implantable Pulse Generator Implant Date: 20180802
Lead Channel Impedance Value: 342 Ohm
Lead Channel Impedance Value: 456 Ohm
Lead Channel Pacing Threshold Amplitude: 0.625 V
Lead Channel Pacing Threshold Pulse Width: 0.4 ms
Lead Channel Sensing Intrinsic Amplitude: 10 mV
Lead Channel Sensing Intrinsic Amplitude: 10 mV
Lead Channel Setting Pacing Amplitude: 2.5 V
Lead Channel Setting Pacing Pulse Width: 0.4 ms
Lead Channel Setting Sensing Sensitivity: 0.3 mV

## 2021-07-11 NOTE — Progress Notes (Signed)
Advanced Heart Failure Clinic Note   PCP: Ladell Pier, MD PCP-Cardiologist: Dr. Ronney Lion GYN : Dr Norman Herrlich.   HPI: 40 y.o. 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   She presented to the ER on 06/17/19 with several days of worsening HF with SOB at rest, marked edema and orthopnea/PND, not responding to increased doses of oral diuretics. She admitted to high levels of water and Gatorade intake. Her admit wt was 380 lb (Dry wt ~360 lb). She was started on IV Lasix but had initial poor urinary response. PO metolazone added as adjunct. Echo showed  EF < 20%, relatively normal RV, moderate MR. [place] 06/21/19 with low output (CI 1.8) and elevated PCWP but near normal RA pressure. She was started on inotropic therapy w/ milrinone for low output, 0.25 mcg/kg/min. Also treated w/ Entresto, Coreg, spironolactone, digoxin and ivabradine. She was later transitioned off of IV Lasix to po torsemide 80 mg qam/60 mg qpm.  Unfortunately, she was not a transplant candidate due to her morbid obesity. We discussed her at [facility] with Dr. Prescott Gum.  She will need significant weight loss to get to LVAD. Will use home milrinone to try to facilitate increased activity to work towards weight loss.  She understands that milrinone is not a good end-point and that we would like to use it as a bridge to eventual LVAD. RV function seems adequate for LVAD. Tunneled catheter was placed for home milrinone and home health was arranged. She was discharged home on 06/25/19 on milrinone 0.25 mcg/kg/min.   06/27/19 she presented to the [facility] w/ CC of palpitations. Was brought in by EMS and was reportedly in SVT with HR in the 180s. She was given adenosine and converted to NSR prior to arrival to the ED.  PICC exchanged 10/15/19  She now has an IUD for contraception (had a terminated pregnancy over the winter).    01/17/21 went to ED for PICC line displacement. IR  consulted and tunneled PICC was replaced.  Echo (5/22) EF 20-25%. Milrinone weaned to 0.125 mcg/kg/min.   She has been fired from [facility] agencies x 2. Most consistent with home environment and noncompliance. On 06/20/21 she was informed that she has no additional [place] visits. Medicaid now does not cover home Milrinone.  We have stopped her milrinone.   Today she returns for HF follow up.  She actually seems to be doing well off milrinone.  Weight is up 4 lbs.  Of note, she was in atrial flutter at last appointment but is in NSR today.  No dyspnea walking on flat ground. Able to do ADLs without problems.  SBP 90 but denies lightheadedness. No orthopnea/PND.  She has daytime sleepiness and reports snoring.   ECG (personally reviewed): NSR, IVCD 134 msec  Labs (10/22): K 3.4, creatinine 0.81  PMH:  1. Chronic systolic CHF: Nonischemic cardiomyopathy.  No family history of cardiomyopathy, no heavy ETOH, cocaine, or amphetamines.  Medtronic ICD.  - Echo (12/16) with EF 20-25%, moderate MR.  - Echo (5/17) with severely dilated LV, EF 10-15%, severe functional MR, mildly decreased RV systolic function. - LHC/RHC (5/17): No significant coronary disease; mean RA 15, PA 61/25 mean 41, mean PCWP 18, CI 1.7 (Fick), PVR 5.4 WU.  - Echo (10/17): EF 20%, severely dilated LV, normal RV size with mildly decreased systolic function, moderate-severe central MR.  - Echo 04/13/17  LVEF 15-20%, Severe LV dilation, Mod  LAE, Mild RAE - CPX (5/19): RER 0.95, peak VO2 9.8, VE/VCO2 26 => submaximal but likely severe functional impairement.  - Echo (7/19): EF 20-25%, moderate LV dilation, RV normal size with mildly decreased systolic function, moderate central MR.  - Echo (10/20): EF < 20%, severe LV enlargement, normal RV>  - RHC (10/20): mean RA 7, PA 65/25, mean PCWP 27, CI 1.8, PVR 3.87, PAPI 5.7.  - Echo (5/22): EF 20-25%, severe LV dilation, RV normal, severe LAE, moderate MR.  2. Morbid obesity.  3. 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  Echo (10/17) with moderate-severe MR. Echo (7/19) with moderate central MR. Echo in 5/22 with moderate MR.  4. OSA: CPAP.  5. SVT 6. Atrial flutter: Noted 10/22.   Current Outpatient Medications  Medication Sig Dispense Refill   Accu-Chek Softclix Lancets lancets Use as instructed 100 each 12   apixaban (ELIQUIS) 5 MG TABS tablet Take 1 tablet (5 mg total) by mouth 2 (two) times daily. 60 tablet 11   atorvastatin (LIPITOR) 10 MG tablet TAKE 1 TABLET (10 MG TOTAL) BY MOUTH DAILY (NOON) 90 tablet 3   Blood Glucose Monitoring Suppl (ACCU-CHEK GUIDE) w/Device KIT Use as directed 1 kit 0   Blood Glucose Monitoring Suppl (TRUE METRIX METER) w/Device KIT Use as directed 1 kit 0   Blood Pressure Monitoring (3 SERIES BP MONITOR/UPPER ARM) DEVI Check BP Daily 1 each 0   carvedilol (COREG) 12.5 MG tablet TAKE ONE TABLET BY MOUTH TWICE DAILY (AM+BEDTIME) 180 tablet 3   CORLANOR 7.5 MG TABS tablet TAKE 1 TABLET BY MOUTH 2 (TWO) TIMES DAILY WITH A MEAL (AM+BEDTIME) 60 tablet 3   dapagliflozin propanediol (FARXIGA) 10 MG TABS tablet Take 1 tablet (10 mg total) by mouth in the morning. 30 tablet 6   digoxin (LANOXIN) 0.125 MG tablet TAKE 1 TABLET (0.125 MG TOTAL) BY MOUTH DAILY (NOON) 90 tablet 3   Dulaglutide (TRULICITY) 1.5 PP/2.9JJ SOPN Inject 1.5 mg into the skin once a week. 2 mL 6   glucose blood (ACCU-CHEK GUIDE) test strip Check blood sugars daily before breakfast. 100 each 12   glucose blood (TRUE METRIX BLOOD GLUCOSE TEST) test strip Use as instructed 100 each 12   levonorgestrel (LILETTA, 52 MG,) 19.5 MCG/DAY IUD IUD 1 each by Intrauterine route once.     magnesium oxide (MAG-OX) 400 (240 Mg) MG tablet Take 1 tablet (400 mg total) by mouth 2 (two) times daily. 90 tablet 6   metolazone (ZAROXOLYN) 2.5 MG tablet TAKE ONLY AS DIRECTED BY THE CHF CLINIC [facility] MORNING) 10 tablet 3   potassium chloride (KLOR-CON) 10 MEQ tablet TAKE 8 TABS IN THE  MORNING, 6 TABS IN THE AFTERNOON, AND 6 TABS IN THE EVENING 600 tablet 11   sacubitril-valsartan (ENTRESTO) 97-103 MG Take 1 tablet by mouth 2 (two) times daily. 60 tablet 6   spironolactone (ALDACTONE) 25 MG tablet TAKE ONE TABLET BY MOUTH ONCE DAILY (NOON) 90 tablet 3   torsemide (DEMADEX) 100 MG tablet TAKE ONE TABLET BY MOUTH TWICE DAILY (AM+NOON) 60 tablet 11   No current facility-administered medications for this encounter.   Allergies  Allergen Reactions   Metformin Diarrhea    And caused severe "dry mouth," also   Metformin And Related Diarrhea    And caused severe "dry mouth," also   Social History   Socioeconomic History   Marital status: Single    Spouse name: Not on file   Number of children: 2   Years of  education: Not on file   Highest education level: Some college, no degree  Occupational History   Occupation: Disability  Tobacco Use   Smoking status: Former    Types: Cigarettes    Quit date: 07/18/2016    Years since quitting: 4.9   Smokeless tobacco: Never   Tobacco comments:    3 cigarettes per day  Vaping Use   Vaping Use: Never used  Substance and Sexual Activity   Alcohol use: Not Currently    Alcohol/week: 0.0 standard drinks    Comment: occasionally   Drug use: Not Currently    Types: Marijuana    Comment: only socially   Sexual activity: Yes    Birth control/protection: None  Other Topics Concern   Not on file  Social History Narrative   Not on file   Social Determinants of Health   Financial Resource Strain: Low Risk    Difficulty of Paying Living Expenses: Not very hard  Food Insecurity: Landscape architect Present   Worried About Charity fundraiser in the Last Year: Sometimes true   Arboriculturist in the Last Year: Sometimes true  Transportation Needs: Unmet Transportation Needs   Lack of Transportation (Medical): No   Lack of Transportation (Non-Medical): Yes  Physical Activity: Not on file  Stress: Not on file  Social Connections:  Not on file  Intimate Partner Violence: Not on file   Family History  Problem Relation Age of Onset   Heart attack Mother    Asthma Mother    Heart failure Mother    Hypertension Father    Diabetes Brother    BP (!) 90/58   Pulse 90   Wt (!) 168 kg (370 lb 6.4 oz)   SpO2 97%   BMI 61.64 kg/m   Wt Readings from Last 3 Encounters:  07/10/21 (!) 168 kg (370 lb 6.4 oz)  06/21/21 (!) 166.2 kg (366 lb 6.4 oz)  06/13/21 (!) 163.3 kg (360 lb)   PHYSICAL EXAM: General: NAD. Morbid obesity.  Neck: No JVD, no thyromegaly or thyroid nodule.  Lungs: Clear to auscultation bilaterally with normal respiratory effort. CV: Nondisplaced PMI.  Heart regular S1/S2, no S3/S4, no murmur.  No peripheral edema.  No carotid bruit.  Normal pedal pulses.  Abdomen: Soft, nontender, no hepatosplenomegaly, no distention.  Skin: Intact without lesions or rashes.  Neurologic: Alert and oriented x 3.  Psych: Normal affect. Extremities: No clubbing or cyanosis.  HEENT: Normal.   ASSESSMENT & PLAN: 1. Chronic systolic CHF:  Nonischemic cardiomyopathy.  Body habitus precludes cardiac MRI.  Echo 06/2019 with EF < 20%, relatively normal RV, moderate MR.  [place] 06/21/19 with low output (CI 1.8) and elevated PCWP but near normal RA pressure, started on milrinone 0.25 with plans to continue home milrinone as bridge to possible LVAD if she could achieve significant weight loss.  Repeat echo 5/22 with EF 20-25%. Unfortunately, she has not succeeded in losing weight and has been dismissed by home health because of noncompliance.  She also cannot get home milrinone now through [facility].  Therefore, she was weaned off milrinone and seems to be doing reasonably well.  NYHA class II symptoms.  Not volume overloaded.   - Continue torsemide 100 mg bid + metolazone once weekly q Wed. Continue current K replacement. BMET today.  - Continue Coreg 12.5 mg bid.  - Continue Entresto 97/103 mg bid.  She seems to also be taking valsartan,  needs to stop this.  - Continue ivabradine  7.5 mg bid.  - Continue spironolactone 25 mg daily.  - Continue digoxin 0.125 mg daily.  Check level today.  - Continue dapagliflozin 10 mg daily.  - We discussed she that she needs to take her HF medications. Continue HF Paramedicine.   - She needs to prevent pregnancy. She now has an IUD. 2. Morbid obesity: Body mass index is 61.64 kg/m. - I will refer to pharmacy clinic for semaglutide.  3. Atrial flutter: Noted in 10/22 at last appointment, she is in NSR today.   - She should be anticoagulated, will start Eliquis 5 mg bid.  4. OSA: Re-order sleep study.    Followup 1 month NP/PA.   Loralie Champagne, MD 07/11/21

## 2021-07-11 NOTE — Telephone Encounter (Signed)
Attempted to reach pt to see what time for a visit today, she did not respond.   Kerry Hough, EMT-Paramedic  07/09/2021    --she later texted me on 10/25 reporting her phone has been dead for a couple of days and her sister sent her a Consulting civil engineer- She had clinic appoint on 10/25.  Will f/u next week.   Kerry Hough, EMT-Paramedic  07/11/21

## 2021-07-11 NOTE — Telephone Encounter (Signed)
HF Paramedicine Team Based Care Meeting  HF MD- NA  HF NP - [name] Clegg NP-C   [facility] HF Paramedicine  Kirsten Wheeler  [facility] admit within the last 30 days for heart failure? no  Medications concerns? Recently went off of milrinone reporting she is feeling ok- starting new medication.  Transportation issues ? [facility]ts  Education needs? Continued emphasis on need for weightloss   SDOH - recently moved in new apartment and getting furniture through [facility] in November.  Eligible for discharge? Not at this time.  Burna Sis, LCSW Clinical Social Worker Advanced Heart Failure Clinic Desk#: [phone] Cell#: [phone]

## 2021-07-16 NOTE — Progress Notes (Signed)
Remote ICD transmission.   

## 2021-07-18 ENCOUNTER — Telehealth (HOSPITAL_COMMUNITY): Payer: Self-pay | Admitting: Licensed Clinical Social Worker

## 2021-07-18 NOTE — Telephone Encounter (Signed)
Pt attended and participated in [facility] Women's Support Group.   ? ?Criselda Starke H. Jaystin Mcgarvey, LCSW ?Clinical Social Worker ?Advanced Heart Failure Clinic ?Desk#: [phone] ?Cell#: [phone] ? ?

## 2021-07-19 ENCOUNTER — Other Ambulatory Visit (HOSPITAL_COMMUNITY): Payer: Self-pay

## 2021-07-19 ENCOUNTER — Encounter (HOSPITAL_COMMUNITY): Payer: Self-pay

## 2021-07-19 NOTE — Progress Notes (Signed)
Paramedicine Encounter    [name], female    DOB: [birth year], 40 y.o.   MRN: [id number]   Patient Care Team: Ladell Pier, MD as PCP - General (Internal Medicine) Constance Haw, MD as PCP - Electrophysiology (Cardiology) Larey Dresser, MD as PCP - Advanced Heart Failure (Cardiology) Jorge Ny, LCSW as Social Worker (Licensed Clinical Social Worker)  Patient Active Problem List   Diagnosis Date Noted   Trichomonal vaginitis 04/06/2021   Heart failure ([place]) 01/18/2021   Liletta IUD (intrauterine device) in place since 10/24/2020 10/24/2020   Influenza vaccine refused 08/24/2020   Anxiety and depression 08/24/2020   23-polyvalent pneumococcal polysaccharide vaccine declined 08/24/2020   Tobacco dependence 08/24/2020   Acute on chronic systolic CHF (congestive heart failure) ([place]) 06/17/2019   Obstructive sleep apnea 06/17/2019   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   Diabetes mellitus ([place]) 06/17/2019   Amenorrhea 08/03/2018   Congestive heart failure ([place]) 06/29/2018   Hypotension 06/07/2018   Rhinovirus infection 06/06/2018   Asthma 06/05/2018   Hypokalemia 06/05/2018   Snoring 09/23/2016   Cough 04/10/2016   NICM (nonischemic cardiomyopathy) ([place]) 01/25/2016   Tobacco abuse 01/25/2016   Acute on chronic combined systolic and diastolic CHF (congestive heart failure) ([place]) 01/25/2016   Mitral regurgitation    Abnormal EKG-inf TWI 01/15/2016   Positive D dimer-CTA neg for PE 01/15/2016   Morbid obesity- BMI 57 01/14/2016   Abdominal pain 01/14/2016   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   At risk for sleep apnea 01/14/2016   Cardiomyopathy ([place]) 11/01/2015   Morbid obesity with BMI of 50.0-59.9, adult ([place]) 08/16/2015    Current Outpatient Medications:    Accu-Chek Softclix Lancets lancets, Use as instructed, Disp: 100 each, Rfl: 12   apixaban (ELIQUIS) 5 MG TABS tablet, Take 1 tablet (5 mg total) by mouth 2  (two) times daily., Disp: 60 tablet, Rfl: 11   atorvastatin (LIPITOR) 10 MG tablet, TAKE 1 TABLET (10 MG TOTAL) BY MOUTH DAILY (NOON), Disp: 90 tablet, Rfl: 3   Blood Glucose Monitoring Suppl (ACCU-CHEK GUIDE) w/Device KIT, Use as directed, Disp: 1 kit, Rfl: 0   Blood Glucose Monitoring Suppl (TRUE METRIX METER) w/Device KIT, Use as directed, Disp: 1 kit, Rfl: 0   Blood Pressure Monitoring (3 SERIES BP MONITOR/UPPER ARM) DEVI, Check BP Daily, Disp: 1 each, Rfl: 0   carvedilol (COREG) 12.5 MG tablet, TAKE ONE TABLET BY MOUTH TWICE DAILY (AM+BEDTIME), Disp: 180 tablet, Rfl: 3   CORLANOR 7.5 MG TABS tablet, TAKE 1 TABLET BY MOUTH 2 (TWO) TIMES DAILY WITH A MEAL (AM+BEDTIME), Disp: 60 tablet, Rfl: 3   dapagliflozin propanediol (FARXIGA) 10 MG TABS tablet, Take 1 tablet (10 mg total) by mouth in the morning., Disp: 30 tablet, Rfl: 6   digoxin (LANOXIN) 0.125 MG tablet, TAKE 1 TABLET (0.125 MG TOTAL) BY MOUTH DAILY (NOON), Disp: 90 tablet, Rfl: 3   Dulaglutide (TRULICITY) 1.5 VH/8.4ON SOPN, Inject 1.5 mg into the skin once a week., Disp: 2 mL, Rfl: 6   glucose blood (ACCU-CHEK GUIDE) test strip, Check blood sugars daily before breakfast., Disp: 100 each, Rfl: 12   glucose blood (TRUE METRIX BLOOD GLUCOSE TEST) test strip, Use as instructed, Disp: 100 each, Rfl: 12   levonorgestrel (LILETTA, 52 MG,) 19.5 MCG/DAY IUD IUD, 1 each by Intrauterine route once., Disp: , Rfl:    magnesium oxide (MAG-OX) 400 (240 Mg) MG tablet, Take 1 tablet (400 mg total) by mouth 2 (two) times daily., Disp: 90  tablet, Rfl: 6   metolazone (ZAROXOLYN) 2.5 MG tablet, TAKE ONLY AS DIRECTED BY THE CHF CLINIC [facility] MORNING), Disp: 10 tablet, Rfl: 3   potassium chloride (KLOR-CON) 10 MEQ tablet, TAKE 8 TABS IN THE MORNING, 6 TABS IN THE AFTERNOON, AND 6 TABS IN THE EVENING, Disp: 600 tablet, Rfl: 11   spironolactone (ALDACTONE) 25 MG tablet, TAKE ONE TABLET BY MOUTH ONCE DAILY (NOON), Disp: 90 tablet, Rfl: 3   torsemide (DEMADEX)  100 MG tablet, TAKE ONE TABLET BY MOUTH TWICE DAILY (AM+NOON), Disp: 60 tablet, Rfl: 11   sacubitril-valsartan (ENTRESTO) 97-103 MG, Take 1 tablet by mouth 2 (two) times daily., Disp: 60 tablet, Rfl: 6 Allergies  Allergen Reactions   Metformin Diarrhea    And caused severe "dry mouth," also   Metformin And Related Diarrhea    And caused severe "dry mouth," also      Social History   Socioeconomic History   Marital status: Single    Spouse name: Not on file   Number of children: 2   Years of education: Not on file   Highest education level: Some college, no degree  Occupational History   Occupation: Disability  Tobacco Use   Smoking status: Former    Types: Cigarettes    Quit date: 07/18/2016    Years since quitting: 5.0   Smokeless tobacco: Never   Tobacco comments:    3 cigarettes per day  Vaping Use   Vaping Use: Never used  Substance and Sexual Activity   Alcohol use: Not Currently    Alcohol/week: 0.0 standard drinks    Comment: occasionally   Drug use: Not Currently    Types: Marijuana    Comment: only socially   Sexual activity: Yes    Birth control/protection: None  Other Topics Concern   Not on file  Social History Narrative   Not on file   Social Determinants of Health   Financial Resource Strain: Low Risk    Difficulty of Paying Living Expenses: Not very hard  Food Insecurity: Food Insecurity Present   Worried About Running Out of Food in the Last Year: Sometimes true   Ran Out of Food in the Last Year: Sometimes true  Transportation Needs: Unmet Transportation Needs   Lack of Transportation (Medical): No   Lack of Transportation (Non-Medical): Yes  Physical Activity: Not on file  Stress: Not on file  Social Connections: Not on file  Intimate Partner Violence: Not on file    Physical Exam      Future Appointments  Date Time Provider [place]  07/30/2021  3:10 PM Ladell Pier, MD [facility] None  07/31/2021 10:30 AM [facility]  PHARMACIST [facility]  08/07/2021 11:00 AM [facility] PA/NP [facility] None  10/10/2021  9:00 AM [facility] DEVICE REMOTES [facility]  01/09/2022  9:00 AM [facility] DEVICE REMOTES [facility]  04/10/2022  9:00 AM [facility] DEVICE REMOTES [facility]  07/10/2022  9:00 AM [facility] DEVICE REMOTES [facility]    BP (!) 90/0   Pulse 100   Resp 20   SpO2 98%   Weight yesterday-? Last visit weight-370 @ clinic A week ago-[phone]  Pt reports she is doing ok, she reports her breathing is doing good. She goes up and down the stairs without issues.   She feels like she is taking her meds better being off the milrinone.  The eliquis will be in her new pack of bubble packs,  They will be delivered tomor.   She said dr  mclean mentioned a diet pill for her but she is unsure what is going on with that. Will ask clinic about that.  She did not take her metolazone yesterday-advised her to take it today.  Will f/u in a few wks.    Kirsten Wheeler, [facility] Paramedic  07/19/21

## 2021-07-24 ENCOUNTER — Telehealth: Payer: Self-pay | Admitting: Student-PharmD

## 2021-07-24 ENCOUNTER — Encounter: Payer: Self-pay | Admitting: Student-PharmD

## 2021-07-24 DIAGNOSIS — Z6841 Body Mass Index (BMI) 40.0 and over, adult: Secondary | ICD-10-CM

## 2021-07-24 NOTE — Telephone Encounter (Signed)
Received referral from Dr. Shirlee Latch to start semaglutide for this patient. Patient meets FDA approved criteria for use with semaglutide for obesity given BMI of 61.6. Obesity is complicated by chronic medication conditions of CHF, OSA, T2DM. No exclusions to use seen per chart review. Need to confirm no personal or family history of medullary thyroid carcinoma when I can speak with the patient.   She is currently on Trulicity, which is also a GLP1 agonist, for T2DM. Medicaid covers Ozempic as well so if she is interested in switching it should be covered without a PA. If she is tolerating Trulicity well, would switch to Ozempic 0.5 mg weekly.   Unable to reach patient and voicemail box is full so could not leave a message. Will send her a mychart message requesting call back.

## 2021-07-25 ENCOUNTER — Ambulatory Visit (INDEPENDENT_AMBULATORY_CARE_PROVIDER_SITE_OTHER): Payer: Medicaid Other

## 2021-07-25 ENCOUNTER — Other Ambulatory Visit: Payer: Self-pay

## 2021-07-25 ENCOUNTER — Other Ambulatory Visit (HOSPITAL_COMMUNITY)
Admission: RE | Admit: 2021-07-25 | Discharge: 2021-07-25 | Disposition: A | Discharge status: Home / Self Care | Payer: Medicaid Other | Source: Ambulatory Visit | Attending: Family Medicine | Admitting: Family Medicine

## 2021-07-25 VITALS — BP 114/74 | HR 88 | Wt 371.1 lb

## 2021-07-25 DIAGNOSIS — N898 Other specified noninflammatory disorders of vagina: Secondary | ICD-10-CM | POA: Insufficient documentation

## 2021-07-25 MED ORDER — OZEMPIC (0.25 OR 0.5 MG/DOSE) 2 MG/1.5ML ~~LOC~~ SOPN: 0.5000 mg | PEN_INJECTOR | SUBCUTANEOUS | 0 refills | Status: DC

## 2021-07-25 NOTE — Progress Notes (Signed)
Pt here for vaginal discharge with odor, denies itching. Pt states has BV hx in the past and would like to have testing again today. Pt states does have new partner. Pt states if has +BV on swab today, she would like to use Rx Metrogel instead of Flagyl.  Self swab collected today. Pt advised results will take 24-48 hours and will see results in mychart and will be notified if needs further treatment. Pt verbalized understanding and agreeable to plan of care.   Judeth Cornfield, RN

## 2021-07-25 NOTE — Telephone Encounter (Signed)
Missed call and received voicemail from patient. Returned her call but she did not answer and could not leave a message. Will reply to her MyChart message letting her know I tried to reach her.

## 2021-07-25 NOTE — Progress Notes (Signed)
Chart reviewed for nurse visit. Agree with plan of care.   Venora Maples, MD 07/25/21 8:40 PM

## 2021-07-25 NOTE — Telephone Encounter (Signed)
Patient returned call, stated she is interested in starting Ozempic for weight loss. Discussed that this works similarly to [employer] which she is currently on. She is still interested in switching from Trulicity to [facility] for additional weight benefit. She currently tolerates Trulicity well at a dose of 1.5 mg weekly (mid dose range). Will therefore switch to a comparable dose, Ozempic 0.5 mg weekly rather than starting the titration at 0.25 mg. I have switched her appt from the [place] office to [facility] per patient's request. We will see her on 11/22 and she will bring her Ozempic pen to that visit. Her last dose of Trulicity will be 11/16. I have sent Rx to her preferred pharmacy, [facility]. Cost should be $0, the same as her Trulicity.

## 2021-07-26 ENCOUNTER — Other Ambulatory Visit: Payer: Self-pay | Admitting: Family Medicine

## 2021-07-26 LAB — CERVICOVAGINAL ANCILLARY ONLY
Bacterial Vaginitis (gardnerella): POSITIVE — AB
Candida Glabrata: NEGATIVE
Candida Vaginitis: NEGATIVE
Chlamydia: NEGATIVE
Comment: NEGATIVE
Comment: NEGATIVE
Comment: NEGATIVE
Comment: NEGATIVE
Comment: NEGATIVE
Comment: NORMAL
Neisseria Gonorrhea: NEGATIVE
Trichomonas: POSITIVE — AB

## 2021-07-26 MED ORDER — METRONIDAZOLE 500 MG PO TABS: 500.0000 mg | ORAL_TABLET | Freq: Two times a day (BID) | ORAL | 0 refills | Status: AC

## 2021-07-30 ENCOUNTER — Ambulatory Visit: Payer: Medicaid Other | Admitting: Internal Medicine

## 2021-07-30 ENCOUNTER — Other Ambulatory Visit: Payer: Self-pay

## 2021-07-31 ENCOUNTER — Ambulatory Visit: Payer: Medicaid Other

## 2021-07-31 ENCOUNTER — Encounter: Payer: Self-pay | Admitting: Internal Medicine

## 2021-07-31 ENCOUNTER — Ambulatory Visit: Payer: Medicaid Other | Attending: Internal Medicine | Admitting: Internal Medicine

## 2021-07-31 ENCOUNTER — Other Ambulatory Visit: Payer: Self-pay

## 2021-07-31 DIAGNOSIS — Z1331 Encounter for screening for depression: Secondary | ICD-10-CM

## 2021-07-31 DIAGNOSIS — I5022 Chronic systolic (congestive) heart failure: Secondary | ICD-10-CM

## 2021-07-31 DIAGNOSIS — E1169 Type 2 diabetes mellitus with other specified complication: Secondary | ICD-10-CM | POA: Diagnosis not present

## 2021-07-31 DIAGNOSIS — Z6841 Body Mass Index (BMI) 40.0 and over, adult: Secondary | ICD-10-CM | POA: Diagnosis not present

## 2021-07-31 DIAGNOSIS — Z87891 Personal history of nicotine dependence: Secondary | ICD-10-CM

## 2021-07-31 DIAGNOSIS — G4733 Obstructive sleep apnea (adult) (pediatric): Secondary | ICD-10-CM

## 2021-07-31 DIAGNOSIS — Z2821 Immunization not carried out because of patient refusal: Secondary | ICD-10-CM

## 2021-07-31 LAB — POCT GLYCOSYLATED HEMOGLOBIN (HGB A1C): HbA1c, POC (controlled diabetic range): 8.2 % — AB (ref 0.0–7.0)

## 2021-07-31 LAB — GLUCOSE, POCT (MANUAL RESULT ENTRY): POC Glucose: 200 mg/dl — AB (ref 70–99)

## 2021-07-31 NOTE — Progress Notes (Signed)
[name], female    DOB: [birth year]  MRN: [id number]  CC: Diabetes   Subjective: Kirsten Wheeler is a 40 y.o. female who presents for chronic ds management Her concerns today include:  Patient with history of NICM with ICD, chronic systolic CHF end-stage on milrinone infusion once a week as a bridge to LVAD (not a candidate for transplant due to her weight), MR moderate, asthma, OSA on CPAP, tobacco dependence, morbid obesity, DM.   Chronic CHF: She was seen by her cardiologist Dr. Aundra Dubin the end of last month.  No longer on Milrinone.  She is continued on her other medications including ivabradine, spironolactone, digoxin, Farxiga, carvedilol and Entresto.  Focus on trying to help her lose wgh.  Denies any chest pains at this time.  She reports increased shortness of breath only if she missed taking her fluid pill.  DM/obesity: Results for orders placed or performed in visit on 07/31/21  POCT glucose (manual entry)  Result Value Ref Range   POC Glucose 200 (A) 70 - 99 mg/dl  POCT glycosylated hemoglobin (Hb A1C)  Result Value Ref Range   Hemoglobin A1C     HbA1c POC (<> result, manual entry)     HbA1c, POC (prediabetic range)     HbA1c, POC (controlled diabetic range) 8.2 (A) 0.0 - 7.0 %   Reports dec appetite on Trulicity.  Pharmacist associated with cardiology clinic plans to change her from Trulicity to [place] in hopes that she would be able to achieve greater weight loss with this agent.  Taking [employer] 2x/day.   Before BF 140-200; bedtime 130-140 Started at [facility] last wk and plans to continue going 2 x/wk, she uses the TM and swimming pool. Just moved into new building with steep stairs She has seen the nutritionist in the past.  She does not feel she needs a refresher at this time. Due for eye exam.  Left eye blurry  Tob dep: quit since last visit.   OSA:  machine broken.  Cardio referred for sleep study.  No appointment as yet.  HM: defers on Tdap  and Pneumonia.      Patient Active Problem List   Diagnosis Date Noted   Trichomonal vaginitis 04/06/2021   Heart failure ([place]) 01/18/2021   [place]ta IUD (intrauterine device) in place since 10/24/2020 10/24/2020   Influenza vaccine refused 08/24/2020   Anxiety and depression 08/24/2020   23-polyvalent pneumococcal polysaccharide vaccine declined 08/24/2020   Tobacco dependence 08/24/2020   Acute on chronic systolic CHF (congestive heart failure) ([place]) 06/17/2019   Obstructive sleep apnea 06/17/2019   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   Diabetes mellitus ([place]) 06/17/2019   Amenorrhea 08/03/2018   Congestive heart failure ([place]) 06/29/2018   Hypotension 06/07/2018   Rhinovirus infection 06/06/2018   Asthma 06/05/2018   Hypokalemia 06/05/2018   Snoring 09/23/2016   Cough 04/10/2016   NICM (nonischemic cardiomyopathy) ([place]) 01/25/2016   Tobacco abuse 01/25/2016   Acute on chronic combined systolic and diastolic CHF (congestive heart failure) ([place]) 01/25/2016   Mitral regurgitation    Abnormal EKG-inf TWI 01/15/2016   Positive D dimer-CTA neg for PE 01/15/2016   Morbid obesity- BMI 57 01/14/2016   Abdominal pain 01/14/2016   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 60 tablet 11   atorvastatin (LIPITOR) 10 MG tablet TAKE 1 TABLET (10 MG TOTAL) BY MOUTH DAILY (NOON) 90 tablet 3   Blood Glucose Monitoring Suppl (ACCU-CHEK GUIDE) w/Device KIT Use as directed 1 kit 0   Blood Glucose Monitoring Suppl (TRUE METRIX METER) w/Device KIT Use as directed 1 kit 0   Blood Pressure  Monitoring (3 SERIES BP MONITOR/UPPER ARM) DEVI Check BP Daily 1 each 0   carvedilol (COREG) 12.5 MG tablet TAKE ONE TABLET BY MOUTH TWICE DAILY (AM+BEDTIME) 180 tablet 3   CORLANOR 7.5 MG TABS tablet TAKE 1 TABLET BY MOUTH 2 (TWO) TIMES DAILY WITH A MEAL (AM+BEDTIME) 60 tablet 3   dapagliflozin propanediol (FARXIGA) 10 MG TABS tablet Take 1 tablet (10 mg total) by mouth in the morning. 30 tablet 6   digoxin (LANOXIN) 0.125 MG tablet TAKE 1 TABLET (0.125 MG TOTAL) BY MOUTH DAILY (NOON) 90 tablet 3   glucose blood (ACCU-CHEK GUIDE) test strip Check blood sugars daily before breakfast. 100 each 12   glucose blood (TRUE METRIX BLOOD GLUCOSE TEST) test strip Use as instructed 100 each 12   levonorgestrel (LILETTA, 52 MG,) 19.5 MCG/DAY IUD IUD 1 each by Intrauterine route once.     magnesium oxide (MAG-OX) 400 (240 Mg) MG tablet Take 1 tablet (400 mg total) by mouth 2 (two) times daily. 90 tablet 6   metroNIDAZOLE (FLAGYL) 500 MG tablet Take 1 tablet (500 mg total) by mouth 2 (two) times daily for 7 days. 14 tablet 0   potassium chloride (KLOR-CON) 10 MEQ tablet TAKE 8 TABS IN THE MORNING, 6 TABS IN THE AFTERNOON, AND 6 TABS IN THE EVENING 600 tablet 11   sacubitril-valsartan (ENTRESTO) 97-103 MG Take 1 tablet by mouth 2 (two) times daily. 60 tablet 6   Semaglutide,0.25 or 0.5MG/DOS, (OZEMPIC, 0.25 OR 0.5 MG/DOSE,) 2 MG/1.5ML SOPN Inject 0.5 mg into the skin once a week. 1.5 mL 0   spironolactone (ALDACTONE) 25 MG tablet TAKE ONE TABLET BY MOUTH ONCE DAILY (NOON) 90 tablet 3   torsemide (DEMADEX) 100 MG tablet Take by mouth.     No current facility-administered medications on file prior to visit.    Allergies  Allergen Reactions   Metformin Diarrhea    And caused severe "dry mouth," also   Metformin And Related Diarrhea    And caused severe "dry mouth," also    Social History   Socioeconomic History   Marital status: Single    Spouse name: Not on file   Number of children: 2   Years of  education: Not on file   Highest education level: Some college, no degree  Occupational History   Occupation: Disability  Tobacco Use   Smoking status: Former    Types: Cigarettes    Quit date: 07/18/2016    Years since quitting: 5.0   Smokeless tobacco: Never   Tobacco comments:    3 cigarettes per day  Vaping Use   Vaping Use: Never used  Substance and Sexual Activity   Alcohol use: Not Currently    Alcohol/week: 0.0 standard drinks    Comment: occasionally   Drug use: Not Currently    Types: Marijuana    Comment: only socially   Sexual activity: Yes    Birth control/protection: None  Other Topics Concern  Not on file  Social History Narrative   Not on file   Social Determinants of Health   Financial Resource Strain: Low Risk    Difficulty of Paying Living Expenses: Not very hard  Food Insecurity: Food Insecurity Present   Worried About Running Out of Food in the Last Year: Sometimes true   Ran Out of Food in the Last Year: Sometimes true  Transportation Needs: Unmet Transportation Needs   Lack of Transportation (Medical): No   Lack of Transportation (Non-Medical): Yes  Physical Activity: Not on file  Stress: Not on file  Social Connections: Not on file  Intimate Partner Violence: Not on file    Family History  Problem Relation Age of Onset   Heart attack Mother    Asthma Mother    Heart failure Mother    Hypertension Father    Diabetes Brother     Past Surgical History:  Procedure Laterality Date   CARDIAC CATHETERIZATION N/A 01/18/2016   Procedure: Right/Left Heart Cath and Coronary Angiography;  Surgeon: Larey Dresser, MD;  Location: [facility];  Service: Cardiovascular;  Laterality: N/A;   CESAREAN SECTION     ICD IMPLANT N/A 04/17/2017   Procedure: ICD Implant;  Surgeon: Constance Haw, MD;  Location: [facility];  Service: Cardiovascular;  Laterality: N/A;   IR FLUORO GUIDE CV LINE RIGHT  06/23/2019   IR FLUORO GUIDE CV LINE RIGHT   10/15/2019   IR FLUORO GUIDE CV LINE RIGHT  01/19/2021   IR REMOVAL TUN CV CATH W/O FL  10/15/2019   IR REMOVAL TUN CV CATH W/O FL  06/22/2021   IR US GUIDE VASC ACCESS RIGHT  06/23/2019   IR US GUIDE VASC ACCESS RIGHT  10/15/2019   LEAD REVISION/REPAIR N/A 04/21/2017   Procedure: Lead Revision/Repair;  Surgeon: Constance Haw, MD;  Location: [facility];  Service: Cardiovascular;  Laterality: N/A;   RIGHT HEART CATH N/A 06/21/2019   Procedure: RIGHT HEART CATH;  Surgeon: Larey Dresser, MD;  Location: [facility];  Service: Cardiovascular;  Laterality: N/A;   TONSILLECTOMY     UNILATERAL SALPINGECTOMY  11/15/2007   unsure of which tube, ectopic pregnancy     ROS: Review of Systems Negative except as stated above  PHYSICAL EXAM: BP 117/88   Pulse 92   Resp 16   Ht _0  (1.626 m)   Wt (!) 371 lb 6.4 oz (168.5 kg)   LMP  (LMP Unknown)   SpO2 96%   BMI 63.75 kg/m   Wt Readings from Last 3 Encounters:  07/31/21 (!) 371 lb 6.4 oz (168.5 kg)  07/25/21 (!) 371 lb 1.6 oz (168.3 kg)  07/10/21 (!) 370 lb 6.4 oz (168 kg)    Physical Exam  General appearance - alert, well appearing, morbidly obese middle-age African-American female and in no distress Mental status - normal mood, behavior, speech, dress, motor activity, and thought processes Neck - supple, no significant adenopathy Chest - clear to auscultation, no wheezes, rales or rhonchi, symmetric air entry Heart - normal rate, regular rhythm, normal S1, S2, no murmurs, rubs, clicks or gallops Extremities - peripheral pulses normal, no pedal edema, no clubbing or cyanosis   CMP Latest Ref Rng & Units 07/10/2021 06/21/2021 05/22/2021  Glucose 70 - 99 mg/dL 221(H) 200(H) 334(H)  BUN 6 - 20 mg/dL _1 Creatinine 0.44 - 1.00 mg/dL 0.76 0.81 0.87  Sodium 135 - 145 mmol/L 133(L) 138 134(L)  Potassium 3.5 - 5.1 mmol/L 3.7 3.4(L) 4.1  Chloride 98 - 111 mmol/L 99 100 103  CO2 22 - 32 mmol/L _0 Calcium 8.9 -  10.3 mg/dL 9.3 9.2 9.4  Total Protein 6.0 - 8.5 g/dL - - -  Total Bilirubin 0.0 - 1.2 mg/dL - - -  Alkaline Phos 44 - 121 IU/L - - -  AST 0 - 40 IU/L - - -  ALT 0 - 32 IU/L - - -   Lipid Panel     Component Value Date/Time   CHOL 144 08/24/2020 1000   TRIG 181 (H) 08/24/2020 1000   HDL 38 (L) 08/24/2020 1000   CHOLHDL 3.8 08/24/2020 1000   CHOLHDL 3.7 06/17/2019 1830   VLDL 24 06/17/2019 1830   LDLCALC 75 08/24/2020 1000    CBC    Component Value Date/Time   WBC 8.6 06/21/2021 1554   RBC 5.62 (H) 06/21/2021 1554   HGB 15.6 (H) 06/21/2021 1554   HGB 15.8 08/24/2020 1000   HCT 44.4 06/21/2021 1554   HCT 46.5 08/24/2020 1000   PLT 249 06/21/2021 1554   PLT 333 08/24/2020 1000   MCV 79.0 (L) 06/21/2021 1554   MCV 81 08/24/2020 1000   MCH 27.8 06/21/2021 1554   MCHC 35.1 06/21/2021 1554   RDW 13.7 06/21/2021 1554   RDW 14.2 08/24/2020 1000   LYMPHSABS 2.3 01/18/2021 1904   MONOABS 0.4 01/18/2021 1904   EOSABS 0.2 01/18/2021 1904   BASOSABS 0.0 01/18/2021 1904    ASSESSMENT AND PLAN:  1. Type 2 diabetes mellitus with morbid obesity (HCC) A1c minimally improved compared to when it was last checked. Discussed and encourage healthy eating habits I am not convinced that changing from Trulicity to [place] will make a big difference for her in terms of degree of weight loss but pt wants to try the Ozempic nonetheless.  She states it has been ordered and will be in next wk I recommend adding Lantus 10 units daily but pt declines.  Wants to see how BS do on Ozempic.  She will let me know via Mychart how BS doing after being on it for about 3 wks Encouraged her to move as much as she can Recommend adding  - POCT glucose (manual entry) - POCT glycosylated hemoglobin (Hb A1C) - Ambulatory referral to Ophthalmology  2. Former smoker Commended her on quitting.  3. OSA (obstructive sleep apnea) Await appointment for sleep study so that new CPAP can be ordered.  4. Chronic  systolic CHF (congestive heart failure) (HCC) Stable.  Followed by cardiology.  5. Pneumococcal vaccination declined   6. Tetanus, diphtheria, and acellular pertussis (Tdap) vaccination declined  7.  Positive depression screen. I forgot to address this today.  Message sent to our LCSW to touch base with her.    Patient was given the opportunity to ask questions.  Patient verbalized understanding of the plan and was able to repeat key elements of the plan.   Orders Placed This Encounter  Procedures   Ambulatory referral to Ophthalmology   POCT glucose (manual entry)   POCT glycosylated hemoglobin (Hb A1C)     Requested Prescriptions    No prescriptions requested or ordered in this encounter    Return in about 4 months (around 11/28/2021).  Karle Plumber, MD, FACP

## 2021-08-03 NOTE — Progress Notes (Incomplete)
Advanced Heart Failure Clinic Note   PCP: Ladell Pier, MD PCP-Cardiologist: Dr. Ronney Lion GYN : Dr Norman Herrlich.   HPI: 40 y.o. 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   She presented to the ER on 06/17/19 with several days of worsening HF with SOB at rest, marked edema and orthopnea/PND, not responding to increased doses of oral diuretics. She admitted to high levels of water and Gatorade intake. Her admit wt was 380 lb (Dry wt ~360 lb). She was started on IV Lasix but had initial poor urinary response. PO metolazone added as adjunct. Echo showed  EF < 20%, relatively normal RV, moderate MR. [place] 06/21/19 with low output (CI 1.8) and elevated PCWP but near normal RA pressure. She was started on inotropic therapy w/ milrinone for low output, 0.25 mcg/kg/min. Also treated w/ Entresto, Coreg, spironolactone, digoxin and ivabradine. She was later transitioned off of IV Lasix to po torsemide 80 mg qam/60 mg qpm.  Unfortunately, she was not a transplant candidate due to her morbid obesity. We discussed her at [facility] with Dr. Prescott Gum.  She will need significant weight loss to get to LVAD. Will use home milrinone to try to facilitate increased activity to work towards weight loss.  She understands that milrinone is not a good end-point and that we would like to use it as a bridge to eventual LVAD. RV function seems adequate for LVAD. Tunneled catheter was placed for home milrinone and home health was arranged. She was discharged home on 06/25/19 on milrinone 0.25 mcg/kg/min.   06/27/19 she presented to the [facility] w/ CC of palpitations. Was brought in by EMS and was reportedly in SVT with HR in the 180s. She was given adenosine and converted to NSR prior to arrival to the ED.  PICC exchanged 10/15/19  She now has an IUD for contraception (had a terminated pregnancy over the winter).    01/17/21 went to ED for PICC line displacement. IR  consulted and tunneled PICC was replaced.  Echo (5/22) EF 20-25%. Milrinone weaned to 0.125 mcg/kg/min.   She has been fired from [facility] agencies x 2. Most consistent with home environment and noncompliance. On 06/20/21 she was informed that she has no additional [place] visits. Medicaid now does not cover home Milrinone.  We have stopped her milrinone.   Today she returns for HF follow up.  She actually seems to be doing well off milrinone.  Weight is up 4 lbs.  Of note, she was in atrial flutter at last appointment but is in NSR today.  No dyspnea walking on flat ground. Able to do ADLs without problems.  SBP 90 but denies lightheadedness. No orthopnea/PND.  She has daytime sleepiness and reports snoring.   ECG (personally reviewed): NSR, IVCD 134 msec  Labs (10/22): K 3.4, creatinine 0.81  PMH:  1. Chronic systolic CHF: Nonischemic cardiomyopathy.  No family history of cardiomyopathy, no heavy ETOH, cocaine, or amphetamines.  Medtronic ICD.  - Echo (12/16) with EF 20-25%, moderate MR.  - Echo (5/17) with severely dilated LV, EF 10-15%, severe functional MR, mildly decreased RV systolic function. - LHC/RHC (5/17): No significant coronary disease; mean RA 15, PA 61/25 mean 41, mean PCWP 18, CI 1.7 (Fick), PVR 5.4 WU.  - Echo (10/17): EF 20%, severely dilated LV, normal RV size with mildly decreased systolic function, moderate-severe central MR.  - Echo 04/13/17  LVEF 15-20%, Severe LV dilation, Mod  LAE, Mild RAE - CPX (5/19): RER 0.95, peak VO2 9.8, VE/VCO2 26 => submaximal but likely severe functional impairement.  - Echo (7/19): EF 20-25%, moderate LV dilation, RV normal size with mildly decreased systolic function, moderate central MR.  - Echo (10/20): EF < 20%, severe LV enlargement, normal RV>  - RHC (10/20): mean RA 7, PA 65/25, mean PCWP 27, CI 1.8, PVR 3.87, PAPI 5.7.  - Echo (5/22): EF 20-25%, severe LV dilation, RV normal, severe LAE, moderate MR.  2. Morbid obesity.  3. 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  Echo (10/17) with moderate-severe MR. Echo (7/19) with moderate central MR. Echo in 5/22 with moderate MR.  4. OSA: CPAP.  5. SVT 6. Atrial flutter: Noted 10/22.   Current Outpatient Medications  Medication Sig Dispense Refill   Accu-Chek Softclix Lancets lancets Use as instructed 100 each 12   apixaban (ELIQUIS) 5 MG TABS tablet Take 1 tablet (5 mg total) by mouth 2 (two) times daily. 60 tablet 11   atorvastatin (LIPITOR) 10 MG tablet TAKE 1 TABLET (10 MG TOTAL) BY MOUTH DAILY (NOON) 90 tablet 3   Blood Glucose Monitoring Suppl (ACCU-CHEK GUIDE) w/Device KIT Use as directed 1 kit 0   Blood Glucose Monitoring Suppl (TRUE METRIX METER) w/Device KIT Use as directed 1 kit 0   Blood Pressure Monitoring (3 SERIES BP MONITOR/UPPER ARM) DEVI Check BP Daily 1 each 0   carvedilol (COREG) 12.5 MG tablet TAKE ONE TABLET BY MOUTH TWICE DAILY (AM+BEDTIME) 180 tablet 3   CORLANOR 7.5 MG TABS tablet TAKE 1 TABLET BY MOUTH 2 (TWO) TIMES DAILY WITH A MEAL (AM+BEDTIME) 60 tablet 3   dapagliflozin propanediol (FARXIGA) 10 MG TABS tablet Take 1 tablet (10 mg total) by mouth in the morning. 30 tablet 6   digoxin (LANOXIN) 0.125 MG tablet TAKE 1 TABLET (0.125 MG TOTAL) BY MOUTH DAILY (NOON) 90 tablet 3   glucose blood (ACCU-CHEK GUIDE) test strip Check blood sugars daily before breakfast. 100 each 12   glucose blood (TRUE METRIX BLOOD GLUCOSE TEST) test strip Use as instructed 100 each 12   levonorgestrel (LILETTA, 52 MG,) 19.5 MCG/DAY IUD IUD 1 each by Intrauterine route once.     magnesium oxide (MAG-OX) 400 (240 Mg) MG tablet Take 1 tablet (400 mg total) by mouth 2 (two) times daily. 90 tablet 6   potassium chloride (KLOR-CON) 10 MEQ tablet TAKE 8 TABS IN THE MORNING, 6 TABS IN THE AFTERNOON, AND 6 TABS IN THE EVENING 600 tablet 11   sacubitril-valsartan (ENTRESTO) 97-103 MG Take 1 tablet by mouth 2 (two) times daily. 60 tablet 6   Semaglutide,0.25 or 0.5MG/DOS,  (OZEMPIC, 0.25 OR 0.5 MG/DOSE,) 2 MG/1.5ML SOPN Inject 0.5 mg into the skin once a week. 1.5 mL 0   spironolactone (ALDACTONE) 25 MG tablet TAKE ONE TABLET BY MOUTH ONCE DAILY (NOON) 90 tablet 3   torsemide (DEMADEX) 100 MG tablet Take by mouth.     No current facility-administered medications for this visit.   Allergies  Allergen Reactions   Metformin Diarrhea    And caused severe "dry mouth," also   Metformin And Related Diarrhea    And caused severe "dry mouth," also   Social History   Socioeconomic History   Marital status: Single    Spouse name: Not on file   Number of children: 2   Years of education: Not on file   Highest education level: Some college, no degree  Occupational History   Occupation: Disability  Tobacco Use   Smoking status: Former    Types: Cigarettes    Quit date: 07/18/2016    Years since quitting: 5.0   Smokeless tobacco: Never   Tobacco comments:    3 cigarettes per day  Vaping Use   Vaping Use: Never used  Substance and Sexual Activity   Alcohol use: Not Currently    Alcohol/week: 0.0 standard drinks    Comment: occasionally   Drug use: Not Currently    Types: Marijuana    Comment: only socially   Sexual activity: Yes    Birth control/protection: None  Other Topics Concern   Not on file  Social History Narrative   Not on file   Social Determinants of Health   Financial Resource Strain: Low Risk    Difficulty of Paying Living Expenses: Not very hard  Food Insecurity: Food Insecurity Present   Worried About Charity fundraiser in the Last Year: Sometimes true   Arboriculturist in the Last Year: Sometimes true  Transportation Needs: Unmet Transportation Needs   Lack of Transportation (Medical): No   Lack of Transportation (Non-Medical): Yes  Physical Activity: Not on file  Stress: Not on file  Social Connections: Not on file  Intimate Partner Violence: Not on file   Family History  Problem Relation Age of Onset   Heart attack  Mother    Asthma Mother    Heart failure Mother    Hypertension Father    Diabetes Brother    LMP  (LMP Unknown)   Wt Readings from Last 3 Encounters:  07/31/21 (!) 168.5 kg (371 lb 6.4 oz)  07/25/21 (!) 168.3 kg (371 lb 1.6 oz)  07/10/21 (!) 168 kg (370 lb 6.4 oz)   PHYSICAL EXAM: General: NAD. Morbid obesity.  Neck: No JVD, no thyromegaly or thyroid nodule.  Lungs: Clear to auscultation bilaterally with normal respiratory effort. CV: Nondisplaced PMI.  Heart regular S1/S2, no S3/S4, no murmur.  No peripheral edema.  No carotid bruit.  Normal pedal pulses.  Abdomen: Soft, nontender, no hepatosplenomegaly, no distention.  Skin: Intact without lesions or rashes.  Neurologic: Alert and oriented x 3.  Psych: Normal affect. Extremities: No clubbing or cyanosis.  HEENT: Normal.   ASSESSMENT & PLAN: 1. Chronic systolic CHF:  Nonischemic cardiomyopathy.  Body habitus precludes cardiac MRI.  Echo 06/2019 with EF < 20%, relatively normal RV, moderate MR.  [place] 06/21/19 with low output (CI 1.8) and elevated PCWP but near normal RA pressure, started on milrinone 0.25 with plans to continue home milrinone as bridge to possible LVAD if she could achieve significant weight loss.  Repeat echo 5/22 with EF 20-25%. Unfortunately, she has not succeeded in losing weight and has been dismissed by home health because of noncompliance.  She also cannot get home milrinone now through [facility].  Therefore, she was weaned off milrinone and seems to be doing reasonably well.  NYHA class II symptoms.  Not volume overloaded.   - Continue torsemide 100 mg bid + metolazone once weekly q Wed. Continue current K replacement. BMET today.  - Continue Coreg 12.5 mg bid.  - Continue Entresto 97/103 mg bid.  She seems to also be taking valsartan, needs to stop this.  - Continue ivabradine 7.5 mg bid.  - Continue spironolactone 25 mg daily.  - Continue digoxin 0.125 mg daily.  Check level today.  - Continue dapagliflozin  10 mg daily.  - We discussed she that she needs to take her HF medications.  Continue HF Paramedicine.   - She needs to prevent pregnancy. She now has an IUD. 2. Morbid obesity: There is no height or weight on file to calculate BMI. - I will refer to pharmacy clinic for semaglutide.  3. Atrial flutter: Noted in 10/22 at last appointment, she is in NSR today.   - She should be anticoagulated, will start Eliquis 5 mg bid.  4. OSA: Re-order sleep study.    Followup 1 month NP/PA.   [place], FNP 08/03/21

## 2021-08-06 ENCOUNTER — Emergency Department (HOSPITAL_COMMUNITY): Payer: Medicaid Other

## 2021-08-06 ENCOUNTER — Emergency Department (HOSPITAL_COMMUNITY)
Admission: EM | Admit: 2021-08-06 | Discharge: 2021-08-06 | Disposition: A | Discharge status: Home / Self Care | Payer: Medicaid Other | Attending: Emergency Medicine | Admitting: Emergency Medicine

## 2021-08-06 ENCOUNTER — Other Ambulatory Visit: Payer: Self-pay

## 2021-08-06 DIAGNOSIS — J45909 Unspecified asthma, uncomplicated: Secondary | ICD-10-CM | POA: Insufficient documentation

## 2021-08-06 DIAGNOSIS — I517 Cardiomegaly: Secondary | ICD-10-CM | POA: Diagnosis not present

## 2021-08-06 DIAGNOSIS — I110 Hypertensive heart disease with heart failure: Secondary | ICD-10-CM | POA: Insufficient documentation

## 2021-08-06 DIAGNOSIS — R0602 Shortness of breath: Secondary | ICD-10-CM | POA: Diagnosis not present

## 2021-08-06 DIAGNOSIS — R079 Chest pain, unspecified: Secondary | ICD-10-CM | POA: Diagnosis not present

## 2021-08-06 DIAGNOSIS — Z28310 Unvaccinated for covid-19: Secondary | ICD-10-CM | POA: Insufficient documentation

## 2021-08-06 DIAGNOSIS — Z20822 Contact with and (suspected) exposure to covid-19: Secondary | ICD-10-CM | POA: Diagnosis not present

## 2021-08-06 DIAGNOSIS — I5043 Acute on chronic combined systolic (congestive) and diastolic (congestive) heart failure: Secondary | ICD-10-CM | POA: Insufficient documentation

## 2021-08-06 DIAGNOSIS — Z7901 Long term (current) use of anticoagulants: Secondary | ICD-10-CM | POA: Diagnosis not present

## 2021-08-06 DIAGNOSIS — E119 Type 2 diabetes mellitus without complications: Secondary | ICD-10-CM | POA: Insufficient documentation

## 2021-08-06 DIAGNOSIS — E871 Hypo-osmolality and hyponatremia: Secondary | ICD-10-CM | POA: Diagnosis not present

## 2021-08-06 DIAGNOSIS — E876 Hypokalemia: Secondary | ICD-10-CM | POA: Diagnosis not present

## 2021-08-06 DIAGNOSIS — J101 Influenza due to other identified influenza virus with other respiratory manifestations: Secondary | ICD-10-CM | POA: Diagnosis not present

## 2021-08-06 DIAGNOSIS — J9801 Acute bronchospasm: Secondary | ICD-10-CM | POA: Insufficient documentation

## 2021-08-06 DIAGNOSIS — Z7984 Long term (current) use of oral hypoglycemic drugs: Secondary | ICD-10-CM | POA: Insufficient documentation

## 2021-08-06 DIAGNOSIS — I5022 Chronic systolic (congestive) heart failure: Secondary | ICD-10-CM

## 2021-08-06 DIAGNOSIS — I447 Left bundle-branch block, unspecified: Secondary | ICD-10-CM | POA: Diagnosis not present

## 2021-08-06 DIAGNOSIS — Z87891 Personal history of nicotine dependence: Secondary | ICD-10-CM | POA: Insufficient documentation

## 2021-08-06 DIAGNOSIS — R0789 Other chest pain: Secondary | ICD-10-CM | POA: Diagnosis not present

## 2021-08-06 DIAGNOSIS — R069 Unspecified abnormalities of breathing: Secondary | ICD-10-CM | POA: Diagnosis not present

## 2021-08-06 DIAGNOSIS — Z79899 Other long term (current) drug therapy: Secondary | ICD-10-CM | POA: Insufficient documentation

## 2021-08-06 DIAGNOSIS — R0902 Hypoxemia: Secondary | ICD-10-CM | POA: Diagnosis not present

## 2021-08-06 LAB — RESP PANEL BY RT-PCR (FLU A&B, COVID) ARPGX2
Influenza A by PCR: POSITIVE — AB
Influenza B by PCR: NEGATIVE
SARS Coronavirus 2 by RT PCR: NEGATIVE

## 2021-08-06 LAB — URINALYSIS, ROUTINE W REFLEX MICROSCOPIC
Bilirubin Urine: NEGATIVE
Glucose, UA: NEGATIVE mg/dL
Hgb urine dipstick: NEGATIVE
Ketones, ur: NEGATIVE mg/dL
Leukocytes,Ua: NEGATIVE
Nitrite: NEGATIVE
Protein, ur: NEGATIVE mg/dL
Specific Gravity, Urine: 1.005 (ref 1.005–1.030)
pH: 5 (ref 5.0–8.0)

## 2021-08-06 LAB — PROTIME-INR
INR: 1.2 (ref 0.8–1.2)
Prothrombin Time: 15 seconds (ref 11.4–15.2)

## 2021-08-06 LAB — COMPREHENSIVE METABOLIC PANEL
ALT: 16 U/L (ref 0–44)
AST: 17 U/L (ref 15–41)
Albumin: 3.6 g/dL (ref 3.5–5.0)
Alkaline Phosphatase: 51 U/L (ref 38–126)
Anion gap: 8 (ref 5–15)
BUN: 10 mg/dL (ref 6–20)
CO2: 28 mmol/L (ref 22–32)
Calcium: 9 mg/dL (ref 8.9–10.3)
Chloride: 96 mmol/L — ABNORMAL LOW (ref 98–111)
Creatinine, Ser: 1.08 mg/dL — ABNORMAL HIGH (ref 0.44–1.00)
GFR, Estimated: >60 mL/min (ref >60)
Glucose, Bld: 208 mg/dL — ABNORMAL HIGH (ref 70–99)
Potassium: 3.4 mmol/L — ABNORMAL LOW (ref 3.5–5.1)
Sodium: 132 mmol/L — ABNORMAL LOW (ref 135–145)
Total Bilirubin: 1.6 mg/dL — ABNORMAL HIGH (ref 0.3–1.2)
Total Protein: 7.5 g/dL (ref 6.5–8.1)

## 2021-08-06 LAB — CBC WITH DIFFERENTIAL/PLATELET
Abs Immature Granulocytes: 0.04 10*3/uL (ref 0.00–0.07)
Basophils Absolute: 0 10*3/uL (ref 0.0–0.1)
Basophils Relative: 0 %
Eosinophils Absolute: 0 10*3/uL (ref 0.0–0.5)
Eosinophils Relative: 0 %
HCT: 40.1 % (ref 36.0–46.0)
Hemoglobin: 14.4 g/dL (ref 12.0–15.0)
Immature Granulocytes: 0 %
Lymphocytes Relative: 14 %
Lymphs Abs: 1.3 10*3/uL (ref 0.7–4.0)
MCH: 27.9 pg (ref 26.0–34.0)
MCHC: 35.9 g/dL (ref 30.0–36.0)
MCV: 77.7 fL — ABNORMAL LOW (ref 80.0–100.0)
Monocytes Absolute: 0.8 10*3/uL (ref 0.1–1.0)
Monocytes Relative: 9 %
Neutro Abs: 7.1 10*3/uL (ref 1.7–7.7)
Neutrophils Relative %: 77 %
Platelets: 221 10*3/uL (ref 150–400)
RBC: 5.16 MIL/uL — ABNORMAL HIGH (ref 3.87–5.11)
RDW: 13.7 % (ref 11.5–15.5)
WBC: 9.3 10*3/uL (ref 4.0–10.5)
nRBC: 0 % (ref 0.0–0.2)

## 2021-08-06 LAB — I-STAT BETA HCG BLOOD, ED (MC, WL, AP ONLY): I-stat hCG, quantitative: <5 m[IU]/mL (ref <5)

## 2021-08-06 LAB — URINE CULTURE: Culture: NO GROWTH

## 2021-08-06 LAB — BRAIN NATRIURETIC PEPTIDE: B Natriuretic Peptide: 471.7 pg/mL — ABNORMAL HIGH (ref 0.0–100.0)

## 2021-08-06 LAB — LACTIC ACID, PLASMA
Lactic Acid, Venous: 2.1 mmol/L (ref 0.5–1.9)
Lactic Acid, Venous: 1.7 mmol/L (ref 0.5–1.9)

## 2021-08-06 LAB — APTT: aPTT: 30 seconds (ref 24–36)

## 2021-08-06 LAB — TROPONIN I (HIGH SENSITIVITY): Troponin I (High Sensitivity): 16 ng/L (ref <18)

## 2021-08-06 MED ORDER — ACETAMINOPHEN 325 MG PO TABS
650.0000 mg | ORAL_TABLET | Freq: Once | ORAL | Status: AC
  Administered 2021-08-06: 650 mg via ORAL
  Filled 2021-08-06: qty 2

## 2021-08-06 MED ORDER — IPRATROPIUM-ALBUTEROL 0.5-2.5 (3) MG/3ML IN SOLN
3.0000 mL | Freq: Once | RESPIRATORY_TRACT | Status: AC
  Administered 2021-08-06: 3 mL via RESPIRATORY_TRACT
  Filled 2021-08-06: qty 3

## 2021-08-06 MED ORDER — SODIUM CHLORIDE 0.9 % IV BOLUS
500.0000 mL | Freq: Once | INTRAVENOUS | Status: AC
  Administered 2021-08-06: 500 mL via INTRAVENOUS

## 2021-08-06 MED ORDER — ALBUTEROL SULFATE (2.5 MG/3ML) 0.083% IN NEBU: 2.5000 mg | INHALATION_SOLUTION | Freq: Four times a day (QID) | RESPIRATORY_TRACT | 0 refills | Status: AC | PRN

## 2021-08-06 MED ORDER — PREDNISONE 10 MG (21) PO TBPK: ORAL_TABLET | Freq: Every day | ORAL | 0 refills | Status: DC

## 2021-08-06 MED ORDER — POTASSIUM CHLORIDE CRYS ER 20 MEQ PO TBCR
40.0000 meq | EXTENDED_RELEASE_TABLET | Freq: Once | ORAL | Status: AC
  Administered 2021-08-06: 40 meq via ORAL
  Filled 2021-08-06: qty 2

## 2021-08-06 MED ORDER — PREDNISONE 20 MG PO TABS
60.0000 mg | ORAL_TABLET | Freq: Once | ORAL | Status: AC
  Administered 2021-08-06: 60 mg via ORAL
  Filled 2021-08-06: qty 3

## 2021-08-06 MED ORDER — OSELTAMIVIR PHOSPHATE 75 MG PO CAPS
75.0000 mg | ORAL_CAPSULE | Freq: Once | ORAL | Status: AC
  Administered 2021-08-06: 75 mg via ORAL
  Filled 2021-08-06: qty 1

## 2021-08-06 MED ORDER — IBUPROFEN 400 MG PO TABS
600.0000 mg | ORAL_TABLET | Freq: Once | ORAL | Status: AC
  Administered 2021-08-06: 600 mg via ORAL
  Filled 2021-08-06: qty 1

## 2021-08-06 MED ORDER — OSELTAMIVIR PHOSPHATE 75 MG PO CAPS: 75.0000 mg | ORAL_CAPSULE | Freq: Two times a day (BID) | ORAL | 0 refills | Status: DC

## 2021-08-06 NOTE — ED Provider Notes (Signed)
Pt is feeling much better after treatment.  She is able to ambulate with O2 sats staying above 97%.  Pt wants to go home as her kids are also sick.  Pt is stable for d/c.  Return if worse.  F/u with pcp.   Jacalyn Lefevre, MD 08/06/21 [phone]

## 2021-08-06 NOTE — ED Notes (Signed)
Pt ambulated around room. Sats ranged from 97% RA to 94% RA

## 2021-08-06 NOTE — ED Triage Notes (Signed)
Pt arrived via [facility] with c/c of shob. Per EMS pt is from home with hx of CHF with shob since yesterday. Pt has a cough, chest pain. Pt presents with left sided defib.    99.9, 98% RA, 110/76, 95 ST 1 albuterol

## 2021-08-06 NOTE — ED Provider Notes (Signed)
[facility] EM[facility] DE[facility]MENT Provider Note   CSN: [id number] Arrival date & time: 08/06/21  0334     History Chief Complaint  Patient presents with   Shortness of Breath    Kirsten Wheeler is a 40 y.o. female.  The history is provided by the patient.  Shortness of Breath She has history diabetes, systolic heart failure, morbid obesity and comes in with shortness of breath which started today.  She started having some general malaise 2 days ago, and her daughter had complained of malaise at about the same time.  She has had a cough productive of clear sputum.  She has noted subjective fever with associated chills and sweats but denies any generalized myalgias.  Dyspnea is worse with laying flat and with exertion.  There have been no other known sick contacts.  She has not been vaccinated against influenza or COVID-19.  She has been compliant with her heart failure medications and has not noted any change in her peripheral edema or body weight.   Past Medical History:  Diagnosis Date   Asthma    Chronic systolic CHF (congestive heart failure) ([place])    a. ECHO 01/15/2016 EF 10-15%. 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   Diabetes mellitus ([place]) 06/17/2019   Hearing loss    bilateral   Mitral regurgitation    a. severe, felt to be functional 2/2 LV dilation    Morbid obesity ([place])    Snoring    Trichomonal vaginitis 04/06/2021    Patient Active Problem List   Diagnosis Date Noted   Trichomonal vaginitis 04/06/2021   Heart failure ([place]) 01/18/2021   Liletta IUD (intrauterine device) in place since 10/24/2020 10/24/2020   Influenza vaccine refused 08/24/2020   Anxiety and depression 08/24/2020   23-polyvalent pneumococcal polysaccharide vaccine declined 08/24/2020   Tobacco dependence 08/24/2020   Acute on chronic systolic CHF (congestive heart failure) ([place]) 06/17/2019   Obstructive sleep apnea 06/17/2019   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   Diabetes mellitus ([place]) 06/17/2019   Amenorrhea 08/03/2018   Congestive heart failure ([place]) 06/29/2018   Hypotension 06/07/2018   Rhinovirus infection 06/06/2018   Asthma 06/05/2018   Hypokalemia 06/05/2018   Snoring 09/23/2016   Cough 04/10/2016   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   Tobacco abuse 01/25/2016   Acute on chronic combined systolic and diastolic CHF (congestive heart failure) ([place]) 01/25/2016   Mitral regurgitation    Abnormal EKG-inf TWI 01/15/2016   Positive D dimer-CTA neg for PE 01/15/2016   Morbid obesity- BMI 57 01/14/2016   Abdominal pain 01/14/2016   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   At risk for sleep apnea 01/14/2016   Cardiomyopathy ([place]) 11/01/2015   Morbid obesity with BMI of 50.0-59.9, adult ([place]) 08/16/2015    Past Surgical History:  Procedure Laterality Date   CARDIAC CATHETERIZATION N/A 01/18/2016   Procedure: Right/Left Heart Cath and Coronary Angiography;  Surgeon: Larey Dresser, MD;  Location: [facility];  Service: Cardiovascular;  Laterality: N/A;   CESAREAN SECTION     ICD IMPLANT N/A 04/17/2017   Procedure: ICD Implant;  Surgeon: Constance Haw, MD;  Location: [facility];  Service: Cardiovascular;  Laterality: N/A;   IR FLUORO GUIDE CV LINE RIGHT  06/23/2019   IR FLUORO GUIDE CV LINE RIGHT  10/15/2019   IR FLUORO GUIDE CV LINE RIGHT  01/19/2021   IR REMOVAL TUN CV CATH W/O FL  10/15/2019   IR REMOVAL TUN CV  CATH W/O FL  06/22/2021   IR US GUIDE VASC ACCESS RIGHT  06/23/2019   IR US GUIDE VASC ACCESS RIGHT  10/15/2019   LEAD REVISION/REPAIR N/A 04/21/2017   Procedure: Lead Revision/Repair;  Surgeon: Constance Haw, MD;  Location: [facility];  Service: Cardiovascular;  Laterality: N/A;   RIGHT HEART CATH N/A 06/21/2019   Procedure: RIGHT HEART CATH;  Surgeon: Larey Dresser, MD;  Location: [facility];  Service: Cardiovascular;  Laterality: N/A;   TONSILLECTOMY      UNILATERAL SALPINGECTOMY  11/15/2007   unsure of which tube, ectopic pregnancy      OB History     Gravida  7   Para  2   Term  2   Preterm      AB  4   Living  2      SAB  3   IAB      Ectopic  1   Multiple      Live Births  2           Family History  Problem Relation Age of Onset   Heart attack Mother    Asthma Mother    Heart failure Mother    Hypertension Father    Diabetes Brother     Social History   Tobacco Use   Smoking status: Former    Types: Cigarettes    Quit date: 07/18/2016    Years since quitting: 5.0   Smokeless tobacco: Never   Tobacco comments:    3 cigarettes per day  Vaping Use   Vaping Use: Never used  Substance Use Topics   Alcohol use: Not Currently    Alcohol/week: 0.0 standard drinks    Comment: occasionally   Drug use: Not Currently    Types: Marijuana    Comment: only socially    Home Medications Prior to Admission medications   Medication Sig Start Date End Date Taking? Authorizing Provider  Accu-Chek Softclix Lancets lancets Use as instructed 11/13/20   Ladell Pier, MD  apixaban (ELIQUIS) 5 MG TABS tablet Take 1 tablet (5 mg total) by mouth 2 (two) times daily. 07/10/21   Larey Dresser, MD  atorvastatin (LIPITOR) 10 MG tablet TAKE 1 TABLET (10 MG TOTAL) BY MOUTH DAILY (NOON) 01/17/21   Larey Dresser, MD  Blood Glucose Monitoring Suppl (ACCU-CHEK GUIDE) w/Device KIT Use as directed 11/13/20   Ladell Pier, MD  Blood Glucose Monitoring Suppl (TRUE METRIX METER) w/Device KIT Use as directed 11/13/20   Ladell Pier, MD  Blood Pressure Monitoring (3 SERIES BP MONITOR/UPPER ARM) DEVI Check BP Daily 01/04/21   Larey Dresser, MD  carvedilol (COREG) 12.5 MG tablet TAKE ONE TABLET BY MOUTH TWICE DAILY (AM+BEDTIME) 12/05/20   Larey Dresser, MD  CORLANOR 7.5 MG TABS tablet TAKE 1 TABLET BY MOUTH 2 (TWO) TIMES DAILY WITH A MEAL (AM+BEDTIME) 06/15/21   Larey Dresser, MD  dapagliflozin propanediol  (FARXIGA) 10 MG TABS tablet Take 1 tablet (10 mg total) by mouth in the morning. 05/17/21   Ladell Pier, MD  digoxin (LANOXIN) 0.125 MG tablet TAKE 1 TABLET (0.125 MG TOTAL) BY MOUTH DAILY (NOON) 01/02/21   Larey Dresser, MD  glucose blood (ACCU-CHEK GUIDE) test strip Check blood sugars daily before breakfast. 11/13/20   Ladell Pier, MD  glucose blood (TRUE METRIX BLOOD GLUCOSE TEST) test strip Use as instructed 11/13/20   Ladell Pier, MD  levonorgestrel [place],  52 MG,) 19.5 MCG/DAY IUD IUD 1 each by Intrauterine route once.    [provider]  magnesium oxide (MAG-OX) 400 (240 Mg) MG tablet Take 1 tablet (400 mg total) by mouth 2 (two) times daily. 02/22/21   Clegg, [name] D, NP  potassium chloride (KLOR-CON) 10 MEQ tablet TAKE 8 TABS IN THE MORNING, 6 TABS IN THE AFTERNOON, AND 6 TABS IN THE EVENING 12/05/20   Larey Dresser, MD  sacubitril-valsartan (ENTRESTO) 97-103 MG Take 1 tablet by mouth 2 (two) times daily. 12/29/20   Larey Dresser, MD  Semaglutide,0.25 or 0.5MG/DOS, (OZEMPIC, 0.25 OR 0.5 MG/DOSE,) 2 MG/1.5ML SOPN Inject 0.5 mg into the skin once a week. 07/25/21   Larey Dresser, MD  spironolactone (ALDACTONE) 25 MG tablet TAKE ONE TABLET BY MOUTH ONCE DAILY (NOON) 10/31/20   Lyda Jester M, PA-C  torsemide (DEMADEX) 100 MG tablet Take by mouth. 10/15/20   [provider]    Allergies    Metformin and Metformin and related  Review of Systems   Review of Systems  Respiratory:  Positive for shortness of breath.   All other systems reviewed and are negative.  Physical Exam Updated Vital Signs BP 110/71 (BP Location: Left Arm)   Pulse 94   Temp (S) (!) 102.7 F (39.3 C) (Oral)   Resp (!) 22   Ht 5' 4"  (1.626 m)   Wt (!) 165.6 kg   LMP  (LMP Unknown)   SpO2 96%   BMI 62.65 kg/m   Physical Exam Vitals and nursing note reviewed.  Morbidly obese 40year old female, resting comfortably and in no acute distress. Vital signs are significant  for elevated temperature and respiratory rate. Oxygen saturation is 96%, which is normal. Head is normocephalic and atraumatic. PERRLA, EOMI. Oropharynx is clear. Neck is nontender and supple without adenopathy or JVD. Back is nontender and there is no CVA tenderness. Lungs have diffuse expiratory wheezes without rales or rhonchi. Chest is nontender. Heart has regular rate and rhythm without murmur. Abdomen is soft, flat, nontender. Extremities have 2+ edema, full range of motion is present. Skin is warm and dry without rash. Neurologic: Mental status is normal, cranial nerves are intact, moves all extremities equally.  ED Results / Procedures / Treatments   Labs (all labs ordered are listed, but only abnormal results are displayed) Labs Reviewed  RESP PANEL BY RT-PCR (FLU A&B, COVID) ARPGX2 - Abnormal; Notable for the following components:      Result Value   Influenza A by PCR POSITIVE (*)    All other components within normal limits  LACTIC ACID, PLASMA - Abnormal; Notable for the following components:   Lactic Acid, Venous 2.1 (*)    All other components within normal limits  COMPREHENSIVE METABOLIC PANEL - Abnormal; Notable for the following components:   Sodium 132 (*)    Potassium 3.4 (*)    Chloride 96 (*)    Glucose, Bld 208 (*)    Creatinine, Ser 1.08 (*)    Total Bilirubin 1.6 (*)    All other components within normal limits  CBC WITH DIFFERENTIAL/PLATELET - Abnormal; Notable for the following components:   RBC 5.16 (*)    MCV 77.7 (*)    All other components within normal limits  URINALYSIS, ROUTINE W REFLEX MICROSCOPIC - Abnormal; Notable for the following components:   Color, Urine STRAW (*)    All other components within normal limits  BRAIN NATRIURETIC PEPTIDE - Abnormal; Notable for the following components:  B Natriuretic Peptide 471.7 (*)    All other components within normal limits  CULTURE, BLOOD (ROUTINE X 2)  CULTURE, BLOOD (ROUTINE X 2)  URINE  CULTURE  PROTIME-INR  APTT  LACTIC ACID, PLASMA  I-STAT BETA HCG BLOOD, ED ([facility], [facility], [facility] ONLY)  TROPONIN I (HIGH SENSITIVITY)    EKG EKG Interpretation  Date/Time:  Monday August 06 2021 03:42:08 EST Ventricular Rate:  93 PR Interval:  176 QRS Duration: 125 QT Interval:  349 QTC Calculation: 435 R Axis:   72 Text Interpretation: Sinus rhythm Ventricular premature complex Probable left atrial enlargement Left bundle branch block When compared with ECG of 06/27/2019, No significant change was found Confirmed by Delora Fuel [id number]) on 08/06/2021 3:47:32 AM  Radiology DG Chest Port 1 View  Result Date: 08/06/2021 CLINICAL DATA:  Presents with possible sepsis. EXAM: PORTABLE CHEST 1 VIEW COMPARISON:  Portable chest 01/18/2021. FINDINGS: There are numerous overlying monitor wires. A left chest single lead cardiac assist device is again noted with unchanged wire positioning. The heart is moderately enlarged which was seen previously. There is mild perihilar vascular congestion without overt edema. The lungs are generally clear with limited view of the bases due to the patient's body habitus. No pleural effusion is seen. IMPRESSION: Cardiomegaly with mild perihilar vascular prominence, without overt findings of acute edema. Limited view of the bases. Electronically Signed   By: Telford Nab M.D.   On: 08/06/2021 04:20    Procedures Procedures   Medications Ordered in ED Medications  ipratropium-albuterol (DUONEB) 0.5-2.5 (3) MG/3ML nebulizer solution 3 mL (has no administration in time range)  oseltamivir (TAMIFLU) capsule 75 mg (has no administration in time range)  predniSONE (DELTASONE) tablet 60 mg (has no administration in time range)  ipratropium-albuterol (DUONEB) 0.5-2.5 (3) MG/3ML nebulizer solution 3 mL (3 mLs Nebulization Given 08/06/21 0435)  acetaminophen (TYLENOL) tablet 650 mg (650 mg Oral Given 08/06/21 0434)    ED Course  I have reviewed the triage vital signs and  the nursing notes.  Pertinent labs & imaging results that were available during my care of the patient were reviewed by me and considered in my medical decision making (see chart for details).   MDM Rules/Calculators/A&P                         Fever with dyspnea concerning for pneumonia versus viral respiratory infection such as influenza or COVID-19.  Consider exacerbation of heart failure.  Because of wheezing, she is given nebulizer treatment with albuterol and ipratropium, chest x-ray is ordered as is respiratory pathogen panel.  ECG shows no acute changes.  Will need to check troponin and BNP.  Old records are reviewed confirming outpatient management of systolic heart failure with most recent echocardiogram on 02/07/2021 showing ejection fraction of 20-25%.  Chest x-ray shows cardiomegaly without evidence of acute edema.  Labs showed mild hypokalemia and hyponatremia, and she is given a dose of oral potassium.  BNP is elevated at 471.7, lactic acid level is borderline elevated at 2.1.  Respiratory pathogen panel is positive for influenza, and it seems that her illnesses related to influenza.  Given modest elevation of lactic acid and patient does not clinically appear septic and with her history of heart failure with markedly reduced ejection fraction, decision is made to not give early goal-directed fluids.  Following albuterol with ipratropium, she stated some improvement but she still had significant wheezing.  She is given a dose of prednisone and  will be given a second nebulizer treatment.  Given her underlying health conditions, she is felt to be a good candidate for antiviral treatment and she is given initial dose of oseltamavir.  She still had moderate wheezing following second nebulizer treatment.  Also, her oxygen saturations are borderline.  She will be given a third nebulizer treatment.  She does express desire to go home, but advised to wait to see if she had better response to third  nebulizer treatment and will need to make sure she does not desaturate with ambulation.  Case is signed out to Dr. Gilford Raid.  Final Clinical Impression(s) / ED Diagnoses Final diagnoses:  Influenza A  Bronchospasm  Hypokalemia  Hyponatremia  Chronic systolic heart failure [facility])    Rx / DC Orders ED Discharge Orders     None        Delora Fuel, MD [birth year] [phone]

## 2021-08-06 NOTE — Discharge Instructions (Addendum)
Alternate tylenol and ibuprofen for fever.  Start prednisone tomorrow.  Use the albuterol nebs every 4 hours as needed.

## 2021-08-06 NOTE — Progress Notes (Unsigned)
[name]                 DOB: Jul 22, 1981                    MRN: [id number]     HPI: Rhett Najera is a 40 y.o. female patient referred to pharmacy clinic by Dr. Aundra Dubin to initiate weight loss therapy with GLP1-RA. PMH is significant for obesity complicated by chronic medical conditions including CHF, T2DM, OSA. Most recent BMI 61.6. She is currently on Trulicity, which is also a GLP1-RA, but she is interested in switching to Ozempic.   She presents today for first injection counseling and administration.  *** weight, BP A1c 8.2 11/15 Diet/exercise? Stop Trulicity, start Ozempic 0.5 F/u visit when?  Current weight management medications: none  Previously tried meds: ***  Current meds that may affect weight: *** Trulicity may decrease weight (she is switching from Trulicity to [employer])  Baseline weight/BMI: ***  Insurance payor: Medicaid  Diet:  -Breakfast: *** -Lunch: *** -Dinner: *** -Snacks: *** -Drinks: ***  Exercise: ***  Family History: ***  Social History: ***  Labs: Lab Results  Component Value Date   HGBA1C 8.2 (A) 07/31/2021    Wt Readings from Last 1 Encounters:  08/06/21 (!) 365 lb (165.6 kg)    BP Readings from Last 1 Encounters:  08/06/21 (!) 88/65   Pulse Readings from Last 1 Encounters:  08/06/21 88       Component Value Date/Time   CHOL 144 08/24/2020 1000   TRIG 181 (H) 08/24/2020 1000   HDL 38 (L) 08/24/2020 1000   CHOLHDL 3.8 08/24/2020 1000   CHOLHDL 3.7 06/17/2019 1830   VLDL 24 06/17/2019 1830   Tarnov 75 08/24/2020 1000    Past Medical History:  Diagnosis Date   Asthma    Chronic systolic CHF (congestive heart failure) ([place])    a. ECHO 01/15/2016 EF 10-15%. 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   Diabetes mellitus ([place]) 06/17/2019   Hearing loss    bilateral   Mitral regurgitation    a. severe, felt to be functional 2/2 LV dilation    Morbid obesity ([place])    Snoring    Trichomonal vaginitis 04/06/2021     Current Outpatient Medications on File Prior to Visit  Medication Sig Dispense Refill   Accu-Chek Softclix Lancets lancets Use as instructed 100 each 12   albuterol (PROVENTIL) (2.5 MG/3ML) 0.083% nebulizer solution Take 3 mLs (2.5 mg total) by nebulization every 6 (six) hours as needed for wheezing or shortness of breath. 75 mL 0   apixaban (ELIQUIS) 5 MG TABS tablet Take 1 tablet (5 mg total) by mouth 2 (two) times daily. 60 tablet 11   atorvastatin (LIPITOR) 10 MG tablet TAKE 1 TABLET (10 MG TOTAL) BY MOUTH DAILY (NOON) 90 tablet 3   Blood Glucose Monitoring Suppl (ACCU-CHEK GUIDE) w/Device KIT Use as directed 1 kit 0   Blood Glucose Monitoring Suppl (TRUE METRIX METER) w/Device KIT Use as directed 1 kit 0   Blood Pressure Monitoring (3 SERIES BP MONITOR/UPPER ARM) DEVI Check BP Daily 1 each 0   carvedilol (COREG) 12.5 MG tablet TAKE ONE TABLET BY MOUTH TWICE DAILY (AM+BEDTIME) 180 tablet 3   CORLANOR 7.5 MG TABS tablet TAKE 1 TABLET BY MOUTH 2 (TWO) TIMES DAILY WITH A MEAL (AM+BEDTIME) 60 tablet 3   dapagliflozin propanediol (FARXIGA) 10 MG TABS tablet Take 1 tablet (10 mg total) by mouth in the  morning. 30 tablet 6   digoxin (LANOXIN) 0.125 MG tablet TAKE 1 TABLET (0.125 MG TOTAL) BY MOUTH DAILY (NOON) 90 tablet 3   glucose blood (ACCU-CHEK GUIDE) test strip Check blood sugars daily before breakfast. 100 each 12   glucose blood (TRUE METRIX BLOOD GLUCOSE TEST) test strip Use as instructed 100 each 12   levonorgestrel (LILETTA, 52 MG,) 19.5 MCG/DAY IUD IUD 1 each by Intrauterine route once.     magnesium oxide (MAG-OX) 400 (240 Mg) MG tablet Take 1 tablet (400 mg total) by mouth 2 (two) times daily. 90 tablet 6   oseltamivir (TAMIFLU) 75 MG capsule Take 1 capsule (75 mg total) by mouth every 12 (twelve) hours. 10 capsule 0   potassium chloride (KLOR-CON) 10 MEQ tablet TAKE 8 TABS IN THE MORNING, 6 TABS IN THE AFTERNOON, AND 6 TABS IN THE EVENING 600 tablet 11   predniSONE (STERAPRED  UNI-PAK 21 TAB) 10 MG (21) TBPK tablet Take by mouth daily. Take 6 tabs by mouth daily  for 2 days, then 5 tabs for 2 days, then 4 tabs for 2 days, then 3 tabs for 2 days, 2 tabs for 2 days, then 1 tab by mouth daily for 2 days 42 tablet 0   sacubitril-valsartan (ENTRESTO) 97-103 MG Take 1 tablet by mouth 2 (two) times daily. 60 tablet 6   Semaglutide,0.25 or 0.5MG/DOS, (OZEMPIC, 0.25 OR 0.5 MG/DOSE,) 2 MG/1.5ML SOPN Inject 0.5 mg into the skin once a week. 1.5 mL 0   spironolactone (ALDACTONE) 25 MG tablet TAKE ONE TABLET BY MOUTH ONCE DAILY (NOON) 90 tablet 3   torsemide (DEMADEX) 100 MG tablet Take by mouth.     No current facility-administered medications on file prior to visit.    Allergies  Allergen Reactions   Metformin Diarrhea    And caused severe "dry mouth," also   Metformin And Related Diarrhea    And caused severe "dry mouth," also     Assessment/Plan:  1. Weight loss - Patient has not met goal of at least 5% of body weight loss with comprehensive lifestyle modifications alone in the past 3-6 months. Pharmacotherapy is appropriate to pursue as augmentation. Since she is tolerating Trulicity well with no GI AE, will switch to Ozempic 0.5 mg weekly and discontinue Trulicity. Confirmed patient not pregnant and no personal or family history of medullary thyroid carcinoma (MTC) or Multiple Endocrine Neoplasia syndrome type 2 (MEN 2).   Advised patient on common side effects including nausea, diarrhea, dyspepsia, decreased appetite, and fatigue. Counseled patient on reducing meal size and how to titrate medication to minimize side effects. Counseled patient to call if intolerable side effects or if experiencing dehydration, abdominal pain, or dizziness. Patient will adhere to dietary modifications and will target at least 150 minutes of moderate intensity exercise weekly.   Follow up in 4 weeks by phone.   Rebbeca Paul, PharmD PGY2 Ambulatory Care Pharmacy Resident 08/06/2021 9:53  AM

## 2021-08-07 ENCOUNTER — Encounter (HOSPITAL_COMMUNITY): Payer: Medicaid Other

## 2021-08-07 ENCOUNTER — Ambulatory Visit: Payer: Medicaid Other

## 2021-08-11 LAB — CULTURE, BLOOD (ROUTINE X 2)
Culture: NO GROWTH
Culture: NO GROWTH
Special Requests: ADEQUATE

## 2021-08-15 ENCOUNTER — Other Ambulatory Visit (HOSPITAL_COMMUNITY): Payer: Self-pay | Admitting: Cardiology

## 2021-08-16 ENCOUNTER — Other Ambulatory Visit (HOSPITAL_COMMUNITY): Payer: Self-pay

## 2021-08-16 DIAGNOSIS — Z419 Encounter for procedure for purposes other than remedying health state, unspecified: Secondary | ICD-10-CM | POA: Diagnosis not present

## 2021-08-17 ENCOUNTER — Telehealth (HOSPITAL_COMMUNITY): Payer: Self-pay

## 2021-08-17 ENCOUNTER — Other Ambulatory Visit (HOSPITAL_COMMUNITY): Payer: Self-pay

## 2021-08-17 NOTE — Telephone Encounter (Signed)
Called patient and was unable to leave a voicemail to confirm/remind patient of their appointment at the [facility] on 08/20/21.

## 2021-08-20 ENCOUNTER — Telehealth (HOSPITAL_COMMUNITY): Payer: Self-pay | Admitting: Pharmacy Technician

## 2021-08-20 ENCOUNTER — Other Ambulatory Visit: Payer: Self-pay

## 2021-08-20 ENCOUNTER — Telehealth: Payer: Self-pay | Admitting: Clinical

## 2021-08-20 ENCOUNTER — Other Ambulatory Visit (HOSPITAL_COMMUNITY): Payer: Self-pay

## 2021-08-20 ENCOUNTER — Encounter (HOSPITAL_COMMUNITY): Payer: Self-pay

## 2021-08-20 ENCOUNTER — Ambulatory Visit (HOSPITAL_COMMUNITY)
Admission: RE | Admit: 2021-08-20 | Discharge: 2021-08-20 | Disposition: A | Discharge status: Home / Self Care | Payer: Medicaid Other | Source: Ambulatory Visit | Attending: Family Medicine | Admitting: Family Medicine

## 2021-08-20 VITALS — BP 90/62 | HR 73 | Wt 369.0 lb

## 2021-08-20 DIAGNOSIS — G4733 Obstructive sleep apnea (adult) (pediatric): Secondary | ICD-10-CM | POA: Insufficient documentation

## 2021-08-20 DIAGNOSIS — Z95810 Presence of automatic (implantable) cardiac defibrillator: Secondary | ICD-10-CM | POA: Insufficient documentation

## 2021-08-20 DIAGNOSIS — G4730 Sleep apnea, unspecified: Secondary | ICD-10-CM | POA: Diagnosis not present

## 2021-08-20 DIAGNOSIS — I340 Nonrheumatic mitral (valve) insufficiency: Secondary | ICD-10-CM | POA: Diagnosis not present

## 2021-08-20 DIAGNOSIS — Z5982 Transportation insecurity: Secondary | ICD-10-CM | POA: Diagnosis not present

## 2021-08-20 DIAGNOSIS — I428 Other cardiomyopathies: Secondary | ICD-10-CM | POA: Diagnosis not present

## 2021-08-20 DIAGNOSIS — E119 Type 2 diabetes mellitus without complications: Secondary | ICD-10-CM | POA: Insufficient documentation

## 2021-08-20 DIAGNOSIS — Z7901 Long term (current) use of anticoagulants: Secondary | ICD-10-CM | POA: Insufficient documentation

## 2021-08-20 DIAGNOSIS — I5042 Chronic combined systolic (congestive) and diastolic (congestive) heart failure: Secondary | ICD-10-CM | POA: Diagnosis not present

## 2021-08-20 DIAGNOSIS — Z975 Presence of (intrauterine) contraceptive device: Secondary | ICD-10-CM | POA: Diagnosis not present

## 2021-08-20 DIAGNOSIS — Z6841 Body Mass Index (BMI) 40.0 and over, adult: Secondary | ICD-10-CM | POA: Diagnosis not present

## 2021-08-20 DIAGNOSIS — Z79899 Other long term (current) drug therapy: Secondary | ICD-10-CM | POA: Insufficient documentation

## 2021-08-20 DIAGNOSIS — I5022 Chronic systolic (congestive) heart failure: Secondary | ICD-10-CM | POA: Diagnosis not present

## 2021-08-20 DIAGNOSIS — Z5941 Food insecurity: Secondary | ICD-10-CM | POA: Diagnosis not present

## 2021-08-20 DIAGNOSIS — I4892 Unspecified atrial flutter: Secondary | ICD-10-CM | POA: Diagnosis not present

## 2021-08-20 LAB — CBC
HCT: 40.9 % (ref 36.0–46.0)
Hemoglobin: 14.5 g/dL (ref 12.0–15.0)
MCH: 27.5 pg (ref 26.0–34.0)
MCHC: 35.5 g/dL (ref 30.0–36.0)
MCV: 77.6 fL — ABNORMAL LOW (ref 80.0–100.0)
Platelets: 186 10*3/uL (ref 150–400)
RBC: 5.27 MIL/uL — ABNORMAL HIGH (ref 3.87–5.11)
RDW: 13.3 % (ref 11.5–15.5)
WBC: 9.6 10*3/uL (ref 4.0–10.5)
nRBC: 0 % (ref 0.0–0.2)

## 2021-08-20 LAB — BASIC METABOLIC PANEL
Anion gap: 7 (ref 5–15)
BUN: 10 mg/dL (ref 6–20)
CO2: 29 mmol/L (ref 22–32)
Calcium: 8.9 mg/dL (ref 8.9–10.3)
Chloride: 97 mmol/L — ABNORMAL LOW (ref 98–111)
Creatinine, Ser: 0.69 mg/dL (ref 0.44–1.00)
GFR, Estimated: >60 mL/min (ref >60)
Glucose, Bld: 337 mg/dL — ABNORMAL HIGH (ref 70–99)
Potassium: 3.3 mmol/L — ABNORMAL LOW (ref 3.5–5.1)
Sodium: 133 mmol/L — ABNORMAL LOW (ref 135–145)

## 2021-08-20 LAB — DIGOXIN LEVEL: Digoxin Level: <0.2 ng/mL — ABNORMAL LOW (ref 0.8–2.0)

## 2021-08-20 MED ORDER — POTASSIUM CHLORIDE 20 MEQ/15ML (10%) PO SOLN: ORAL | 6 refills | Status: DC

## 2021-08-20 NOTE — Telephone Encounter (Signed)
Patient Advocate Encounter   Received notification from [facility] that prior authorization for Sherryll Burger is required.   PA submitted on CoverMyMeds Key [id number] Status is pending   Will continue to follow.

## 2021-08-20 NOTE — Telephone Encounter (Signed)
I spoke with pt today per request from PCP (see below). She mentioned experiencing depression and anxiety. She stated that she was about to go out of town for her stepmother's funeral and she won't be back until the new year. I scheduled appt with pt for 09/19/21 at 2:00pm.   Patient with positive depression screen.  I did not get to address this today.  Can you please follow-up with her.

## 2021-08-20 NOTE — Telephone Encounter (Signed)
Advanced Heart Failure Patient Advocate Encounter  Prior Authorization for Kirsten Wheeler has been approved.    PA# [id number] Effective dates: 08/06/21 through 08/20/22  Patients co-pay is $4 (90 days)  Archer Asa, CPhT

## 2021-08-20 NOTE — Patient Instructions (Signed)
Thank You for coming in today  Labs were done today, if any labs are abnormal the clinic will call you  You have been referred for in Lab sleep study, their office call you to schedule  Potassium was changed to liquid form  Your physician recommends that you schedule a follow-up appointment in: 3 months  At the [facility], you and your health needs are our priority. As part of our continuing mission to provide you with exceptional heart care, we have created designated Provider Care Teams. These Care Teams include your primary Cardiologist (physician) and Advanced Practice Providers (APPs- Physician Assistants and Nurse Practitioners) who all work together to provide you with the care you need, when you need it.   You may see any of the following providers on your designated Care Team at your next follow up: Dr Arvilla Meres Dr Carron Curie, NP Robbie Lis, [place] [facility] [place], [place] Karle Plumber, PharmD   Please be sure to bring in all your medications bottles to every appointment.    If you have any questions or concerns before your next appointment please send [place] a message through [place] or call our office at [phone].    TO LEAVE A MESSAGE FOR THE NURSE SELECT OPTION 2, PLEASE LEAVE A MESSAGE INCLUDING: YOUR NAME DATE OF BIRTH CALL BACK NUMBER REASON FOR CALL**this is important as we prioritize the call backs  YOU WILL RECEIVE A CALL BACK THE SAME DAY AS LONG AS YOU CALL BEFORE 4:00 PM

## 2021-08-20 NOTE — Progress Notes (Signed)
Advanced Heart Failure Clinic Note   PCP: Ladell Pier, MD PCP-Cardiologist: Dr. Ronney Lion GYN : Dr Norman Herrlich.   HPI: 40 y.o. 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   She presented to the ER on 06/17/19 with several days of worsening HF with SOB at rest, marked edema and orthopnea/PND, not responding to increased doses of oral diuretics. She admitted to high levels of water and Gatorade intake. Her admit wt was 380 lb (Dry wt ~360 lb). She was started on IV Lasix but had initial poor urinary response. PO metolazone added as adjunct. Echo showed  EF < 20%, relatively normal RV, moderate MR. Prince 06/21/19 with low output (CI 1.8) and elevated PCWP but near normal RA pressure. She was started on inotropic therapy w/ milrinone for low output, 0.25 mcg/kg/min. Also treated w/ Entresto, Coreg, spironolactone, digoxin and ivabradine. She was later transitioned off of IV Lasix to po torsemide 80 mg qam/60 mg qpm.  Unfortunately, she was not a transplant candidate due to her morbid obesity. We discussed her at [facility] with Dr. Prescott Gum.  She will need significant weight loss to get to LVAD. Will use home milrinone to try to facilitate increased activity to work towards weight loss.  She understands that milrinone is not a good end-point and that we would like to use it as a bridge to eventual LVAD. RV function seems adequate for LVAD. Tunneled catheter was placed for home milrinone and home health was arranged. She was discharged home on 06/25/19 on milrinone 0.25 mcg/kg/min.   06/27/19 she presented to the [facility] w/ CC of palpitations. Was brought in by EMS and was reportedly in SVT with HR in the 180s. She was given adenosine and converted to NSR prior to arrival to the ED.  PICC exchanged 10/15/19  She now has an IUD for contraception (had a terminated pregnancy over the winter).    01/17/21 went to ED for PICC line displacement. IR  consulted and tunneled PICC was replaced.  Echo (5/22) EF 20-25%. Milrinone weaned to 0.125 mcg/kg/min.   She has been fired from [facility] agencies x 2. Most consistent with home environment and noncompliance. On 06/20/21 she was informed that she has no additional [place] visits. Medicaid now does not cover home Milrinone.  We have stopped her milrinone.   Today she returns for HF follow up w/ paramedicine. No significant exertional dyspnea. Had an episode of atypical chest pain that resolved spontaneously. Has occasional dizziness she attributes this to elevated blood sugar. Overall feeling fine and doing well off milrinone. Denies palpitations, abnormal bleeding, dizziness, edema, or PND/Orthopnea. Appetite ok. No fever or chills. Weight at home 365-370 pounds. Taking all medications. She starts Multimedia programmer. Has not had sleep study. Potassium pills are causing nausea.  Device interrogation (personally reviewed): OptiVol down, stable thoracic impedence, 2 hrs day/activity, no AF/AT, no VT.  ECG (personally reviewed): none ordered today.  Labs (10/22): K 3.4, creatinine 0.81 Labs (11/22): K 3.4, creatinine 1.08, hgb 14.4  PMH:  1. Chronic systolic CHF: Nonischemic cardiomyopathy.  No family history of cardiomyopathy, no heavy ETOH, cocaine, or amphetamines.  Medtronic ICD.  - Echo (12/16) with EF 20-25%, moderate MR.  - Echo (5/17) with severely dilated LV, EF 10-15%, severe functional MR, mildly decreased RV systolic function. - LHC/RHC (5/17): No significant coronary disease; mean RA 15, PA 61/25 mean 41, mean PCWP 18, CI 1.7 (Fick), PVR 5.4  WU.  - Echo (10/17): EF 20%, severely dilated LV, normal RV size with mildly decreased systolic function, moderate-severe central MR.  - Echo 04/13/17  LVEF 15-20%, Severe LV dilation, Mod LAE, Mild RAE - CPX (5/19): RER 0.95, peak VO2 9.8, VE/VCO2 26 => submaximal but likely severe functional impairement.  - Echo (7/19): EF 20-25%, moderate LV dilation, RV  normal size with mildly decreased systolic function, moderate central MR.  - Echo (10/20): EF < 20%, severe LV enlargement, normal RV>  - RHC (10/20): mean RA 7, PA 65/25, mean PCWP 27, CI 1.8, PVR 3.87, PAPI 5.7.  - Echo (5/22): EF 20-25%, severe LV dilation, RV normal, severe LAE, moderate MR.  2. Morbid obesity.  3. 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  Echo (10/17) with moderate-severe MR. Echo (7/19) with moderate central MR. Echo in 5/22 with moderate MR.  4. OSA: CPAP.  5. SVT 6. Atrial flutter: Noted 10/22.   Current Outpatient Medications  Medication Sig Dispense Refill   Accu-Chek Softclix Lancets lancets Use as instructed 100 each 12   albuterol (PROVENTIL) (2.5 MG/3ML) 0.083% nebulizer solution Take 3 mLs (2.5 mg total) by nebulization every 6 (six) hours as needed for wheezing or shortness of breath. 75 mL 0   apixaban (ELIQUIS) 5 MG TABS tablet Take 1 tablet (5 mg total) by mouth 2 (two) times daily. 60 tablet 11   atorvastatin (LIPITOR) 10 MG tablet TAKE 1 TABLET (10 MG TOTAL) BY MOUTH DAILY (NOON) 90 tablet 3   Blood Glucose Monitoring Suppl (ACCU-CHEK GUIDE) w/Device KIT Use as directed 1 kit 0   Blood Glucose Monitoring Suppl (TRUE METRIX METER) w/Device KIT Use as directed 1 kit 0   Blood Pressure Monitoring (3 SERIES BP MONITOR/UPPER ARM) DEVI Check BP Daily 1 each 0   carvedilol (COREG) 12.5 MG tablet TAKE ONE TABLET BY MOUTH TWICE DAILY (AM+BEDTIME) 180 tablet 3   CORLANOR 7.5 MG TABS tablet TAKE 1 TABLET BY MOUTH 2 (TWO) TIMES DAILY WITH A MEAL (AM+BEDTIME) 60 tablet 3   dapagliflozin propanediol (FARXIGA) 10 MG TABS tablet Take 1 tablet (10 mg total) by mouth in the morning. 30 tablet 6   digoxin (LANOXIN) 0.125 MG tablet TAKE 1 TABLET (0.125 MG TOTAL) BY MOUTH DAILY (NOON) 90 tablet 3   ENTRESTO 97-103 MG TAKE 1 TABLET BY MOUTH 2 (TWO) TIMES DAILY. (AM+BEDTIME) 60 tablet 5   glucose blood (ACCU-CHEK GUIDE) test strip Check blood sugars daily before  breakfast. 100 each 12   glucose blood (TRUE METRIX BLOOD GLUCOSE TEST) test strip Use as instructed 100 each 12   levonorgestrel (LILETTA, 52 MG,) 19.5 MCG/DAY IUD IUD 1 each by Intrauterine route once.     magnesium oxide (MAG-OX) 400 (240 Mg) MG tablet Take 1 tablet (400 mg total) by mouth 2 (two) times daily. 90 tablet 6   potassium chloride (KLOR-CON) 10 MEQ tablet TAKE 8 TABS IN THE MORNING, 6 TABS IN THE AFTERNOON, AND 6 TABS IN THE EVENING 600 tablet 11   spironolactone (ALDACTONE) 25 MG tablet TAKE ONE TABLET BY MOUTH ONCE DAILY (NOON) 90 tablet 3   torsemide (DEMADEX) 100 MG tablet Take 100 mg by mouth 2 (two) times daily.     Semaglutide,0.25 or 0.5MG/DOS, (OZEMPIC, 0.25 OR 0.5 MG/DOSE,) 2 MG/1.5ML SOPN Inject 0.5 mg into the skin once a week. (Patient not taking: Reported on 08/20/2021) 1.5 mL 0   No current facility-administered medications for this encounter.   Allergies  Allergen Reactions   Metformin Diarrhea  And caused severe "dry mouth," also   Metformin And Related Diarrhea    And caused severe "dry mouth," also   Social History   Socioeconomic History   Marital status: Single    Spouse name: Not on file   Number of children: 2   Years of education: Not on file   Highest education level: Some college, no degree  Occupational History   Occupation: Disability  Tobacco Use   Smoking status: Former    Types: Cigarettes    Quit date: 07/18/2016    Years since quitting: 5.0   Smokeless tobacco: Never   Tobacco comments:    3 cigarettes per day  Vaping Use   Vaping Use: Never used  Substance and Sexual Activity   Alcohol use: Not Currently    Alcohol/week: 0.0 standard drinks    Comment: occasionally   Drug use: Not Currently    Types: Marijuana    Comment: only socially   Sexual activity: Yes    Birth control/protection: None  Other Topics Concern   Not on file  Social History Narrative   Not on file   Social Determinants of Health   Financial  Resource Strain: Low Risk    Difficulty of Paying Living Expenses: Not very hard  Food Insecurity: Landscape architect Present   Worried About Charity fundraiser in the Last Year: Sometimes true   Arboriculturist in the Last Year: Sometimes true  Transportation Needs: Unmet Transportation Needs   Lack of Transportation (Medical): No   Lack of Transportation (Non-Medical): Yes  Physical Activity: Not on file  Stress: Not on file  Social Connections: Not on file  Intimate Partner Violence: Not on file   Family History  Problem Relation Age of Onset   Heart attack Mother    Asthma Mother    Heart failure Mother    Hypertension Father    Diabetes Brother    BP 90/62   Pulse 73   Wt (!) 167.4 kg (369 lb)   LMP  (LMP Unknown)   SpO2 96%   BMI 63.34 kg/m   Wt Readings from Last 3 Encounters:  08/20/21 (!) 167.4 kg (369 lb)  08/06/21 (!) 165.6 kg (365 lb)  07/31/21 (!) 168.5 kg (371 lb 6.4 oz)   PHYSICAL EXAM: General:  NAD. No resp difficulty, walked into clinic HEENT: Normal Neck: Supple. No JVD. Carotids 2+ bilat; no bruits. No lymphadenopathy or thryomegaly appreciated. Cor: PMI nondisplaced. Regular rate & rhythm. No rubs, gallops or murmurs. Lungs: Clear Abdomen: Morbidly obese, nontender, nondistended. No hepatosplenomegaly. No bruits or masses. Good bowel sounds. Extremities: No cyanosis, clubbing, rash, edema Neuro: Alert & oriented x 3, cranial nerves grossly intact. Moves all 4 extremities w/o difficulty. Affect pleasant.  ASSESSMENT & PLAN: 1. Chronic systolic CHF:  Nonischemic cardiomyopathy.  Body habitus precludes cardiac MRI.  Echo 06/2019 with EF < 20%, relatively normal RV, moderate MR.  [place] 06/21/19 with low output (CI 1.8) and elevated PCWP but near normal RA pressure, started on milrinone 0.25 with plans to continue home milrinone as bridge to possible LVAD if she could achieve significant weight loss.  Repeat echo 5/22 with EF 20-25%. Unfortunately, she has not  succeeded in losing weight and has been dismissed by home health because of noncompliance.  She also cannot get home milrinone now through [facility]  Therefore, she was weaned off milrinone and seems to be doing reasonably well.  NYHA class II symptoms.  Not volume overloaded.   -  Continue torsemide 100 mg bid + metolazone once weekly q Wed. Change KCL replacement to liquid solution. BMET/BNP today.  - Continue Coreg 12.5 mg bid.  - Continue Entresto 97/103 mg bid.   - Continue ivabradine 7.5 mg bid. HR 73 today. - Continue spironolactone 25 mg daily.  - Continue digoxin 0.125 mg daily.  Check level today.  - Continue dapagliflozin 10 mg daily.  - Continue HF Paramedicine.   - She needs to prevent pregnancy. She now has an IUD. 2. Morbid obesity: Body mass index is 63.34 kg/m. - She starts semaglutide tomorrow.  3. Atrial flutter: Noted on 10/22, regular on exam today. No AT/AF on device interrogation. - Continue Eliquis 5 mg bid. No bleeding issues. 4. OSA: Re-order sleep study.    Followup 2-3 months with Dr. Loistine Simas, FNP 08/20/21

## 2021-08-20 NOTE — Progress Notes (Signed)
[name]                 DOB: 1980-10-07                    MRN: [id number]     HPI: Kirsten Wheeler is a 40 y.o. female patient referred to pharmacy clinic by Dr. Aundra Dubin to initiate weight loss therapy with GLP1-RA. PMH is significant for obesity complicated by chronic medical conditions including CHF, T2DM, OSA. Most recent BMI 61.6.   She is currently on Trulicity, which is also a GLP1-RA, but she is interested in switching to Ozempic for better weight loss. PA for Ozempic approved through 08/20/22. Her last dose of Trulicity was last Wednesday. She endorses working on portion control and avoiding carbs. She is working on increasing her exercise.   Current weight management medications: none  Previously tried meds: none  Current meds that may affect weight: Trulicity may decrease weight (she is switching from Trulicity to [employer])  Baseline weight/BMI: 371.2 lb (BMI 63.72)  Insurance payor: Medicaid  Diet:  -Breakfast: coffee, eggs, grits; takes medicine in the morning -Lunch: sometimes skips; leftover chicken pasta -Dinner: collared greens, rice, BBQ ribs; tries to avoid rice/ -Snacks: candy (gummies) -Drinks: lemon water, ice drinks, 1 Pepsi per week  Exercise: stairs at house; used to walk 2.8 miles in an hour (last in October)  Social History: Former smoker (quit 2017)  Labs: Lab Results  Component Value Date   HGBA1C 8.2 (A) 07/31/2021    Wt Readings from Last 1 Encounters:  08/06/21 (!) 365 lb (165.6 kg)    BP Readings from Last 1 Encounters:  08/06/21 (!) 88/65   Pulse Readings from Last 1 Encounters:  08/06/21 88       Component Value Date/Time   CHOL 144 08/24/2020 1000   TRIG 181 (H) 08/24/2020 1000   HDL 38 (L) 08/24/2020 1000   CHOLHDL 3.8 08/24/2020 1000   CHOLHDL 3.7 06/17/2019 1830   VLDL 24 06/17/2019 1830   [place] 75 08/24/2020 1000    Past Medical History:  Diagnosis Date   Asthma    Chronic systolic CHF (congestive heart  failure) ([place])    a. ECHO 01/15/2016 EF 10-15%. 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   Diabetes mellitus ([place]) 06/17/2019   Hearing loss    bilateral   Mitral regurgitation    a. severe, felt to be functional 2/2 LV dilation    Morbid obesity ([place])    Snoring    Trichomonal vaginitis 04/06/2021    Current Outpatient Medications on File Prior to Visit  Medication Sig Dispense Refill   Accu-Chek Softclix Lancets lancets Use as instructed 100 each 12   albuterol (PROVENTIL) (2.5 MG/3ML) 0.083% nebulizer solution Take 3 mLs (2.5 mg total) by nebulization every 6 (six) hours as needed for wheezing or shortness of breath. 75 mL 0   apixaban (ELIQUIS) 5 MG TABS tablet Take 1 tablet (5 mg total) by mouth 2 (two) times daily. 60 tablet 11   atorvastatin (LIPITOR) 10 MG tablet TAKE 1 TABLET (10 MG TOTAL) BY MOUTH DAILY (NOON) 90 tablet 3   Blood Glucose Monitoring Suppl (ACCU-CHEK GUIDE) w/Device KIT Use as directed 1 kit 0   Blood Glucose Monitoring Suppl (TRUE METRIX METER) w/Device KIT Use as directed 1 kit 0   Blood Pressure Monitoring (3 SERIES BP MONITOR/UPPER ARM) DEVI Check BP Daily 1 each 0   carvedilol (COREG) 12.5 MG tablet TAKE ONE  TABLET BY MOUTH TWICE DAILY (AM+BEDTIME) 180 tablet 3   CORLANOR 7.5 MG TABS tablet TAKE 1 TABLET BY MOUTH 2 (TWO) TIMES DAILY WITH A MEAL (AM+BEDTIME) 60 tablet 3   dapagliflozin propanediol (FARXIGA) 10 MG TABS tablet Take 1 tablet (10 mg total) by mouth in the morning. 30 tablet 6   digoxin (LANOXIN) 0.125 MG tablet TAKE 1 TABLET (0.125 MG TOTAL) BY MOUTH DAILY (NOON) 90 tablet 3   ENTRESTO 97-103 MG TAKE 1 TABLET BY MOUTH 2 (TWO) TIMES DAILY. (AM+BEDTIME) 60 tablet 5   glucose blood (ACCU-CHEK GUIDE) test strip Check blood sugars daily before breakfast. 100 each 12   glucose blood (TRUE METRIX BLOOD GLUCOSE TEST) test strip Use as instructed 100 each 12   levonorgestrel (LILETTA, 52 MG,) 19.5 MCG/DAY IUD IUD 1 each by Intrauterine route once.      magnesium oxide (MAG-OX) 400 (240 Mg) MG tablet Take 1 tablet (400 mg total) by mouth 2 (two) times daily. 90 tablet 6   oseltamivir (TAMIFLU) 75 MG capsule Take 1 capsule (75 mg total) by mouth every 12 (twelve) hours. 10 capsule 0   potassium chloride (KLOR-CON) 10 MEQ tablet TAKE 8 TABS IN THE MORNING, 6 TABS IN THE AFTERNOON, AND 6 TABS IN THE EVENING 600 tablet 11   predniSONE (STERAPRED UNI-PAK 21 TAB) 10 MG (21) TBPK tablet Take by mouth daily. Take 6 tabs by mouth daily  for 2 days, then 5 tabs for 2 days, then 4 tabs for 2 days, then 3 tabs for 2 days, 2 tabs for 2 days, then 1 tab by mouth daily for 2 days 42 tablet 0   Semaglutide,0.25 or 0.5MG/DOS, (OZEMPIC, 0.25 OR 0.5 MG/DOSE,) 2 MG/1.5ML SOPN Inject 0.5 mg into the skin once a week. 1.5 mL 0   spironolactone (ALDACTONE) 25 MG tablet TAKE ONE TABLET BY MOUTH ONCE DAILY (NOON) 90 tablet 3   torsemide (DEMADEX) 100 MG tablet Take by mouth.     No current facility-administered medications on file prior to visit.    Allergies  Allergen Reactions   Metformin Diarrhea    And caused severe "dry mouth," also   Metformin And Related Diarrhea    And caused severe "dry mouth," also     Assessment/Plan:  1. Weight loss - Patient has not met goal of at least 5% of body weight loss with comprehensive lifestyle modifications alone in the past 3-6 months. Pharmacotherapy is appropriate to pursue as augmentation. Since she is tolerating Trulicity well with no GI AE, will switch to Ozempic 0.5 mg SQ once weekly and discontinue Trulicity. She will start Ozempic 1 week after last Trulicity dose. Confirmed patient not pregnant and no personal or family history of medullary thyroid carcinoma (MTC) or Multiple Endocrine Neoplasia syndrome type 2 (MEN 2).   Advised patient on common side effects including nausea, diarrhea, dyspepsia, decreased appetite, and fatigue. Counseled patient on reducing meal size and how to titrate medication to minimize side  effects. Counseled patient to call if intolerable side effects or if experiencing dehydration, abdominal pain, or dizziness. Patient will adhere to dietary modifications and will target at least 150 minutes of moderate intensity exercise weekly.   Follow up in 4 weeks by phone. Will plan to increase to 1 mg weekly at that time if she is tolerating Ozempic well.   Rebbeca Paul, PharmD PGY2 Ambulatory Care Pharmacy Resident 08/20/2021 10:07 AM

## 2021-08-20 NOTE — Progress Notes (Signed)
Paramedicine Encounter   Patient ID: Bayan Hedstrom , female,   DOB: June 15, 1981,40 y.o.,  MRN: [id number]   Met patient in clinic today with provider.   Weight @ clinic-369 B/p-90/62 P-73 [id number]  Pt reports she is doing alright, she is better from the flu. She just lost her step-mother, she is going back home to attend the services.   She is due for her bubble packs. Pharmacy said her entresto and ozempic needed a PA.   She denies bleeding.  She reports the other night she had a c/p, felt like a soreness.  She woke up feeling that way, but then it subsided.  Still needs to do the sleep study. The order was put in back in October but nobody had contacted her. Janett Billow is going to resend it.  She felt a little dizzy recently and she checked her CBG and it was over 300.   Labs done today. She is changing her potassium tablets for the liquid to see if that helps with her abd pains after she takes the tablets.   She is planning on going out of town from [place].  Will f/u after that.  Advised her to ensure she has all meds prior to leaving and to contact me should she have any issues getting them.    [name], [place] 08/20/2021

## 2021-08-21 ENCOUNTER — Ambulatory Visit (INDEPENDENT_AMBULATORY_CARE_PROVIDER_SITE_OTHER): Payer: Medicaid Other | Admitting: Student-PharmD

## 2021-08-21 ENCOUNTER — Other Ambulatory Visit (HOSPITAL_COMMUNITY): Payer: Self-pay

## 2021-08-21 ENCOUNTER — Telehealth (HOSPITAL_COMMUNITY): Payer: Self-pay

## 2021-08-21 VITALS — Wt 371.2 lb

## 2021-08-21 DIAGNOSIS — Z6841 Body Mass Index (BMI) 40.0 and over, adult: Secondary | ICD-10-CM | POA: Diagnosis not present

## 2021-08-21 MED ORDER — POTASSIUM CHLORIDE 20 MEQ/15ML (10%) PO SOLN: ORAL | 6 refills | Status: DC

## 2021-08-21 NOTE — Telephone Encounter (Signed)
-----   Message from Jacklynn Ganong, [place] sent at 08/20/2021  4:51 PM EST ----- K is low. Has she been taking KCL suppl consistently? If so, increase daily KCL by 40 mEq (80 tid total) Repeat BMET in 1 week

## 2021-08-21 NOTE — Telephone Encounter (Addendum)
Pt aware, agreeable, and verbalized understanding.   script  needs to be mailed to patient to bring with her to have labs done while out of town. Wasn't taking potassium as prescribed or daily, so told her not to increase dose per Prince Rome NP.

## 2021-08-21 NOTE — Patient Instructions (Addendum)
Ozempic Receptor Agonist Counseling Points This medication reduces your appetite and may make you feel fuller longer.  Stop eating when your body tells you that you are full. This will likely happen sooner than you are used to. Store your medication in the fridge until you are ready to use it. Inject your medication in the fatty tissue of your lower abdominal area (2 inches away from belly button) or upper outer thigh. Rotate injection sites. Each pen will last you about 1 month (the first month it will last a few weeks longer). Use a different needle with each weekly injection. Common side effects include: nausea, diarrhea/constipation, and heartburn, and are more likely to occur if you overeat.  Dosing schedule: Ozempic Agonist Titration Plan:  Will plan to follow the titration plan as below, pending patient is tolerating each dose before increasing to the next. Can slow titration if needed for tolerability.   Ozempic:  -Month 1: Inject Ozempic 0.5 mg SQ once weekly x 4 weeks -Month 2: Inject Ozempic 1 mg  SQ once weekly x 4 weeks -Month 3+: Inject Ozempic 2 mg SQ once weekly x 4 weeks   Tips for living a healthier life     Building a Healthy and Balanced Diet Make most of your meal vegetables and fruits -  of your plate. Aim for color and variety, and remember that potatoes don't count as vegetables on the Healthy Eating Plate because of their negative impact on blood sugar.  Go for whole grains -  of your plate. Whole and intact grains--whole wheat, barley, wheat berries, quinoa, oats, brown rice, and foods made with them, such as whole wheat pasta--have a milder effect on blood sugar and insulin than white bread, white rice, and other refined grains.  Protein power -  of your plate. Fish, poultry, beans, and nuts are all healthy, versatile protein sources--they can be mixed into salads, and pair well with vegetables on a plate. Limit red meat, and avoid processed meats such as  bacon and sausage.  Healthy plant oils - in moderation. Choose healthy vegetable oils like olive, canola, soy, corn, sunflower, peanut, and others, and avoid partially hydrogenated oils, which contain unhealthy trans fats. Remember that low-fat does not mean "healthy."  Drink water, coffee, or tea. Skip sugary drinks, limit milk and dairy products to one to two servings per day, and limit juice to a small glass per day.  Stay active. The red figure running across the Healthy Eating Plate's placemat is a reminder that staying active is also important in weight control.  The main message of the Healthy Eating Plate is to focus on diet quality:  The type of carbohydrate in the diet is more important than the amount of carbohydrate in the diet, because some sources of carbohydrate--like vegetables (other than potatoes), fruits, whole grains, and beans--are healthier than others. The Healthy Eating Plate also advises consumers to avoid sugary beverages, a major source of calories--usually with little nutritional value--in the American diet. The Healthy Eating Plate encourages consumers to use healthy oils, and it does not set a maximum on the percentage of calories people should get each day from healthy sources of fat. In this way, the Healthy Eating Plate recommends the opposite of the low-fat message promoted for decades by the USDA.  [employer]  SUGAR  Sugar is a huge problem in the modern day diet. Sugar is a big contributor to heart disease, diabetes, high triglyceride levels, fatty liver disease and obesity. Sugar is hidden  in almost all packaged foods/beverages. Added sugar is extra sugar that is added beyond what is naturally found and has no nutritional benefit for your body. The American Heart Association recommends limiting added sugars to no more than 25g for women and 36 grams for men per day. There are many names for sugar  including maltose, sucrose (names ending in "ose"), high fructose corn syrup, molasses, cane sugar, corn sweetener, raw sugar, syrup, honey or fruit juice concentrate.   One of the best ways to limit your added sugars is to stop drinking sweetened beverages such as soda, sweet tea, and fruit juice.  There is 65g of added sugars in one 20oz bottle of Coke! That is equal to 7.5 donuts.   Pay attention and read all nutrition facts labels. Below is an examples of a nutrition facts label. The #1 is showing you the total sugars where the # 2 is showing you the added sugars. This one serving has almost the max amount of added sugars per day!     20 oz Soda 65g Sugar = 7.5 Glazed Donuts  16oz Energy  Drink 54g Sugar = 6.5 Glazed Donuts  Large Sweet  Tea 38g Sugar = 4 Glazed Donuts  20oz Sports  Drink 34g Sugar = 3.5 Glazed Donuts  8oz Chocolate Milk 24g Sugar =2.5 Glazed Donuts  8oz Orange  Juice 21g Sugar = 2 Glazed Donuts  1 Juice Box 14g Sugar = 1.5 Glazed Donuts  16oz Water= NO SUGAR!!  EXERCISE  Exercise is good. We've all heard that. In an ideal world, we would all have time and resources to get plenty of it. When you are active, your heart pumps more efficiently and you will feel better.  Multiple studies show that even walking regularly has benefits that include living a longer life. The American Heart Association recommends 150 minutes per week of exercise (30 minutes per day most days of the week). You can do this in any increment you wish. Nine or more 10-minute walks count. So does an hour-long exercise class. Break the time apart into what will work in your life. Some of the best things you can do include walking briskly, jogging, cycling or swimming laps. Not everyone is ready to "exercise." Sometimes we need to start with just getting active. Here are some easy ways to be more active throughout the day:  Take the stairs instead of the elevator  Go for a 10-15 minute  walk during your lunch break (find a friend to make it more enjoyable)  When shopping, park at the back of the parking lot  If you take public transportation, get off one stop early and walk the extra distance  Pace around while making phone calls  Check with your doctor if you aren't sure what your limitations may be. Always remember to drink plenty of water when doing any type of exercise. Don't feel like a failure if you're not getting the 90-150 minutes per week. If you started by being a couch potato, then just a 10-minute walk each day is a huge improvement. Start with little victories and work your way up.   HEALTHY EATING TIPS  When looking to improve your eating habits, whether to lose weight, lower blood pressure or just be healthier, it helps to know what a serving size is.   Grains 1 slice of bread,  bagel,  cup pasta or rice  Vegetables 1 cup fresh or raw vegetables,  cup cooked or canned Fruits 1 piece  of medium sized fruit,  cup canned,   Meats/Proteins  cup dried       1 oz meat, 1 egg,  cup cooked beans, nuts or seeds  Dairy        Fats Individual yogurt container, 1 cup (8oz)    1 teaspoon margarine/butter or vegetable  milk or milk alternative, 1 slice of cheese          oil; 1 tablespoon mayonnaise or salad dressing                  Plan ahead: make a menu of the meals for a week then create a grocery list to go with that menu. Consider meals that easily stretch into a night of leftovers, such as stews or casseroles. Or consider making two of your favorite meal and put one in the freezer for another night. Try a night or two each week that is "meatless" or "no cook" such as salads. When you get home from the grocery store wash and prepare your vegetables and fruits. Then when you need them they are ready to go.   Tips for going to the grocery store:  Buy store or generic brands  Check the weekly ad from your store on-line or in their in-store flyer  Look at the  unit price on the shelf tag to compare/contrast the costs of different items  Buy fruits/vegetables in season  Carrots, bananas and apples are low-cost, naturally healthy items  If meats or frozen vegetables are on sale, buy some extras and put in your freezer  Limit buying prepared or "ready to eat" items, even if they are pre-made salads or fruit snacks  Do not shop when you're hungry  Foods at eye level tend to be more expensive. Look on the high and low shelves for deals.  Consider shopping at the farmer's market for fresh foods in season.  Avoid the cookie and chip aisles (these are expensive, high in calories and low in nutritional value). Shop on the outside of the grocery store.  Healthy food preparations:  If you can't get lean hamburger, be sure to drain the fat when cooking  Steam, saut (in olive oil), grill or bake foods  Experiment with different seasonings to avoid adding salt to your foods. Kosher salt, sea salt and Himalayan salt are all still salt and should be avoided. Try seasoning food with onion, garlic, thyme, rosemary, basil ect. Onion powder or garlic powder is ok. Avoid if it says salt (ie garlic salt).

## 2021-09-04 ENCOUNTER — Ambulatory Visit: Payer: Medicaid Other | Admitting: Family Medicine

## 2021-09-16 DIAGNOSIS — Z419 Encounter for procedure for purposes other than remedying health state, unspecified: Secondary | ICD-10-CM | POA: Diagnosis not present

## 2021-09-18 ENCOUNTER — Telehealth: Payer: Self-pay | Admitting: Student-PharmD

## 2021-09-18 MED ORDER — OZEMPIC (1 MG/DOSE) 4 MG/3ML ~~LOC~~ SOPN: 1.0000 mg | PEN_INJECTOR | SUBCUTANEOUS | 1 refills | Status: DC

## 2021-09-18 NOTE — Telephone Encounter (Signed)
Called patient to see how she is tolerating Ozempic after switching from Trulicity about a month ago. Her 4th dose of 0.5 mg weekly is due tomorrow then she will be out of her current pen. She experienced some nausea after her second injection but it resolves after a day and is mild. She would like to increase to 1 mg weekly which she will start next Wednesday. I will follow up after she's had about 4 weeks of this to see if she would like to increase to 2 mg (if able to find in pharmacies).

## 2021-09-19 ENCOUNTER — Institutional Professional Consult (permissible substitution): Payer: Medicaid Other | Admitting: Clinical

## 2021-09-25 ENCOUNTER — Other Ambulatory Visit (HOSPITAL_COMMUNITY): Payer: Self-pay

## 2021-09-25 NOTE — Progress Notes (Signed)
Paramedicine Encounter    [name], female    DOB: October 01, 1980, 41 y.o.   MRN: [id number]  Pt reports she is doing ok. She spent the holidays in [place] with her family. Recently got back home.  She denies any issues or complaints.  She denies increased sob, no increased dizziness.  Seems to be feeling well being off the IV med.  She does need to the sleep study.  Meds verified-she is taking the liquid potassium  - I gave her a syringe to measure it-pharmacy did not give her one. Biggest I have is 46m.   The farxiga is missing from her bubble packs- Pharmacy advised it needed a PA-I sent message to [facility]  She hs metolazone in bubble pack but is not listed in chart- But I see in the last clinic visit its still on her list.  Sent note to triage nurse about her metolazone and sent note to JOpal Sidles ref the [place] a PA.  Her weight is up, she denies missing any doses recently.  Her ankles are puffy and she does have some edema to her lower legs.  She does report tenderness to her ankle area when I pressed in on them checking for fluid. She has been told by PCP it was due to the diabetes.  She does report that she has been getting frequent yeast infections.  She took the monistat OTC and it took care of it.   She does need to contact clinic to make appointment for labs that she missed back in dec.   Per last discussion with clinic she can now be d/c from paramedicine. She is managing things on her own, there are a few things that she needs to work on.  She is about to get a car from her father so she will have transportation of her own soon.    CBG's ranging from 130-300 BP (!) 100/0    Pulse 82    Resp 18    Wt (!) 374 lb (169.6 kg)    SpO2 98%    BMI 64.20 kg/m  Weight yesterday-? Last visit weight-369   [facility]  Patient has/has not met the following goals:  Yes :Patient expresses basic understanding of medications  and what they are for Yes :Patient able to verbalize heart failure specific dietary/fluid restrictions Yes :Patient is aware of who to call if they have medical concerns or if they need to schedule or change appts Yes :Patient has a scale for daily weights and weighs regularly Yes :Patient able to verbalize concerning symptoms when they should call the HF clinic (weight gain ranges, etc) Yes :Patient has a PCP and has seen within the past year or has upcoming appt Yes :Patient has reliable access to getting their medications Yes :Patient has shown they are able to reorder medications reliably No :Patient has had admission in past 30 days- if yes how many? No :Patient has had admission in past 90 days- if yes how many?  Discharge Comments:     Patient Care Team: JLadell Pier MD as PCP - General (Internal Medicine) CConstance Haw MD as PCP - Electrophysiology (Cardiology) MLarey Dresser MD as PCP - Advanced Heart Failure (Cardiology) UJorge Ny LCSW as Social Worker (Licensed Clinical Social Worker)  Patient Active Problem List   Diagnosis Date Noted   Trichomonal vaginitis 04/06/2021   Heart failure ([place] 01/18/2021   Liletta IUD (intrauterine device) in  place since 10/24/2020 10/24/2020   Influenza vaccine refused 08/24/2020   Anxiety and depression 08/24/2020   23-polyvalent pneumococcal polysaccharide vaccine declined 08/24/2020   Tobacco dependence 08/24/2020   Acute on chronic systolic CHF (congestive heart failure) ([place]) 06/17/2019   Obstructive sleep apnea 06/17/2019   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   Diabetes mellitus ([place]) 06/17/2019   Amenorrhea 08/03/2018   Congestive heart failure ([place]) 06/29/2018   Hypotension 06/07/2018   Rhinovirus infection 06/06/2018   Asthma 06/05/2018   Hypokalemia 06/05/2018   Snoring 09/23/2016   Cough 04/10/2016   NICM (nonischemic cardiomyopathy) ([place]) 01/25/2016   Tobacco abuse  01/25/2016   Acute on chronic combined systolic and diastolic CHF (congestive heart failure) ([place]) 01/25/2016   Mitral regurgitation    Abnormal EKG-inf TWI 01/15/2016   Positive D dimer-CTA neg for PE 01/15/2016   Morbid obesity- BMI 57 01/14/2016   Abdominal pain 01/14/2016   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   At risk for sleep apnea 01/14/2016   Cardiomyopathy ([place]) 11/01/2015   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 60.0 to 69.9 in adult [facility]) 08/16/2015    Current Outpatient Medications:    Accu-Chek Softclix Lancets lancets, Use as instructed, Disp: 100 each, Rfl: 12   albuterol (PROVENTIL) (2.5 MG/3ML) 0.083% nebulizer solution, Take 3 mLs (2.5 mg total) by nebulization every 6 (six) hours as needed for wheezing or shortness of breath., Disp: 75 mL, Rfl: 0   apixaban (ELIQUIS) 5 MG TABS tablet, Take 1 tablet (5 mg total) by mouth 2 (two) times daily., Disp: 60 tablet, Rfl: 11   atorvastatin (LIPITOR) 10 MG tablet, TAKE 1 TABLET (10 MG TOTAL) BY MOUTH DAILY (NOON), Disp: 90 tablet, Rfl: 3   Blood Glucose Monitoring Suppl (ACCU-CHEK GUIDE) w/Device KIT, Use as directed, Disp: 1 kit, Rfl: 0   Blood Glucose Monitoring Suppl (TRUE METRIX METER) w/Device KIT, Use as directed, Disp: 1 kit, Rfl: 0   Blood Pressure Monitoring (3 SERIES BP MONITOR/UPPER ARM) DEVI, Check BP Daily, Disp: 1 each, Rfl: 0   carvedilol (COREG) 12.5 MG tablet, TAKE ONE TABLET BY MOUTH TWICE DAILY (AM+BEDTIME), Disp: 180 tablet, Rfl: 3   CORLANOR 7.5 MG TABS tablet, TAKE 1 TABLET BY MOUTH 2 (TWO) TIMES DAILY WITH A MEAL (AM+BEDTIME), Disp: 60 tablet, Rfl: 3   dapagliflozin propanediol (FARXIGA) 10 MG TABS tablet, Take 1 tablet (10 mg total) by mouth in the morning., Disp: 30 tablet, Rfl: 6   digoxin (LANOXIN) 0.125 MG tablet, TAKE 1 TABLET (0.125 MG TOTAL) BY MOUTH DAILY (NOON), Disp: 90 tablet, Rfl: 3   ENTRESTO 97-103 MG, TAKE 1 TABLET BY MOUTH 2 (TWO) TIMES DAILY.  (AM+BEDTIME), Disp: 60 tablet, Rfl: 5   glucose blood (ACCU-CHEK GUIDE) test strip, Check blood sugars daily before breakfast., Disp: 100 each, Rfl: 12   glucose blood (TRUE METRIX BLOOD GLUCOSE TEST) test strip, Use as instructed, Disp: 100 each, Rfl: 12   levonorgestrel (LILETTA, 52 MG,) 19.5 MCG/DAY IUD IUD, 1 each by Intrauterine route once., Disp: , Rfl:    magnesium oxide (MAG-OX) 400 (240 Mg) MG tablet, Take 1 tablet (400 mg total) by mouth 2 (two) times daily., Disp: 90 tablet, Rfl: 6   potassium chloride 20 MEQ/15ML (10%) SOLN, Take 60 mLs (80 mEq total) by mouth every morning AND 45 mLs (60 mEq total) daily at 12 noon AND 45 mLs (60 mEq total) every evening., Disp: 4500 mL, Rfl: 6   Semaglutide, 1 MG/DOSE, (  OZEMPIC, 1 MG/DOSE,) 4 MG/3ML SOPN, Inject 1 mg into the skin once a week., Disp: 3 mL, Rfl: 1   spironolactone (ALDACTONE) 25 MG tablet, TAKE ONE TABLET BY MOUTH ONCE DAILY (NOON), Disp: 90 tablet, Rfl: 3   torsemide (DEMADEX) 100 MG tablet, Take 100 mg by mouth 2 (two) times daily., Disp: , Rfl:  Allergies  Allergen Reactions   Metformin Diarrhea    And caused severe "dry mouth," also   Metformin And Related Diarrhea    And caused severe "dry mouth," also      Social History   Socioeconomic History   Marital status: Single    Spouse name: Not on file   Number of children: 2   Years of education: Not on file   Highest education level: Some college, no degree  Occupational History   Occupation: Disability  Tobacco Use   Smoking status: Former    Types: Cigarettes    Quit date: 07/18/2016    Years since quitting: 5.1   Smokeless tobacco: Never   Tobacco comments:    3 cigarettes per day  Vaping Use   Vaping Use: Never used  Substance and Sexual Activity   Alcohol use: Not Currently    Alcohol/week: 0.0 standard drinks    Comment: occasionally   Drug use: Not Currently    Types: Marijuana    Comment: only socially   Sexual activity: Yes    Birth  control/protection: None  Other Topics Concern   Not on file  Social History Narrative   Not on file   Social Determinants of Health   Financial Resource Strain: Low Risk    Difficulty of Paying Living Expenses: Not very hard  Food Insecurity: Food Insecurity Present   Worried About Running Out of Food in the Last Year: Sometimes true   Ran Out of Food in the Last Year: Sometimes true  Transportation Needs: Unmet Transportation Needs   Lack of Transportation (Medical): No   Lack of Transportation (Non-Medical): Yes  Physical Activity: Not on file  Stress: Not on file  Social Connections: Not on file  Intimate Partner Violence: Not on file    Physical Exam      Future Appointments  Date Time Provider [place]  10/10/2021  9:00 AM [facility] DEVICE REMOTES [facility]  11/22/2021 12:00 PM Larey Dresser, MD [facility] None  01/09/2022  9:00 AM [facility] DEVICE REMOTES [facility]  04/10/2022  9:00 AM [facility] DEVICE REMOTES [facility]  07/10/2022  9:00 AM [facility] DEVICE REMOTES [facility]       [name], [facility] Paramedic  09/25/21

## 2021-09-26 ENCOUNTER — Telehealth (HOSPITAL_COMMUNITY): Payer: Self-pay | Admitting: Surgery

## 2021-09-26 ENCOUNTER — Telehealth: Payer: Self-pay | Admitting: Pharmacist

## 2021-09-26 ENCOUNTER — Other Ambulatory Visit: Payer: Self-pay

## 2021-09-26 MED ORDER — METOLAZONE 2.5 MG PO TABS: 2.5000 mg | ORAL_TABLET | ORAL | 3 refills | Status: DC

## 2021-09-26 NOTE — Telephone Encounter (Signed)
-----   Message from Kerry Hough, EMT sent at 09/25/2021  3:57 PM EST ----- Hey,   Her metolazone is not in her med list, it looks like per the last clinic note she is to continue it.  Not sure how it fell off her list.   Can you fix it please?    Thanks, Kerry Hough, EMT-Paramedic  09/25/21

## 2021-09-26 NOTE — Telephone Encounter (Signed)
PA for Wilder Glade has been approved until 09/26/2022.

## 2021-09-26 NOTE — Telephone Encounter (Signed)
[facility] has been notified and are processing the prescription.

## 2021-09-26 NOTE — Telephone Encounter (Signed)
I received a message from [place] Paramedic that patient needed refill of Metolazone.  Upon chart review I see that it has fallen off of her medlist in [facility]. According to the last note from [facility] NP during patient's last clinic visit she was to continue taking the Metolazone 2.5mg  once weekly.  I have added back to medlist in [facility] and informed Joellen Jersey that it was sent to the pharmacy.

## 2021-09-26 NOTE — Telephone Encounter (Signed)
Kirsten Wheeler,   This patient fills at [facility]. One of the paramedicine EMTs contacted me asking for help for a PA for the patient's Farxiga. Are we able to start this?

## 2021-10-01 ENCOUNTER — Other Ambulatory Visit: Payer: Self-pay

## 2021-10-01 ENCOUNTER — Other Ambulatory Visit (HOSPITAL_COMMUNITY): Payer: Medicaid Other

## 2021-10-02 ENCOUNTER — Other Ambulatory Visit: Payer: Self-pay | Admitting: Cardiology

## 2021-10-03 ENCOUNTER — Telehealth (HOSPITAL_COMMUNITY): Payer: Self-pay | Admitting: Licensed Clinical Social Worker

## 2021-10-03 NOTE — Telephone Encounter (Signed)
Pt reached out to CSW to schedule a ride to lab appts tomorrow- CSW set up through [employer]  CSW will continue to follow and assist as needed  Burna Sis, LCSW Clinical Social Worker [facility] Desk#: [phone] Cell#: [phone]

## 2021-10-04 ENCOUNTER — Other Ambulatory Visit: Payer: Self-pay

## 2021-10-04 ENCOUNTER — Telehealth (HOSPITAL_COMMUNITY): Payer: Self-pay | Admitting: Licensed Clinical Social Worker

## 2021-10-04 ENCOUNTER — Other Ambulatory Visit (HOSPITAL_COMMUNITY): Payer: Self-pay

## 2021-10-04 ENCOUNTER — Ambulatory Visit (HOSPITAL_COMMUNITY)
Admission: RE | Admit: 2021-10-04 | Discharge: 2021-10-04 | Disposition: A | Discharge status: Home / Self Care | Payer: Medicaid Other | Source: Ambulatory Visit | Attending: Internal Medicine | Admitting: Internal Medicine

## 2021-10-04 DIAGNOSIS — I5022 Chronic systolic (congestive) heart failure: Secondary | ICD-10-CM | POA: Diagnosis not present

## 2021-10-04 LAB — BASIC METABOLIC PANEL
Anion gap: 8 (ref 5–15)
BUN: 10 mg/dL (ref 6–20)
CO2: 27 mmol/L (ref 22–32)
Calcium: 9.1 mg/dL (ref 8.9–10.3)
Chloride: 101 mmol/L (ref 98–111)
Creatinine, Ser: 0.88 mg/dL (ref 0.44–1.00)
GFR, Estimated: >60 mL/min (ref >60)
Glucose, Bld: 246 mg/dL — ABNORMAL HIGH (ref 70–99)
Potassium: 4.1 mmol/L (ref 3.5–5.1)
Sodium: 136 mmol/L (ref 135–145)

## 2021-10-04 NOTE — Telephone Encounter (Signed)
Pt messaged CSW to request help with ride to MD appt next week.  CSW able to schedule through [employer].  Will continue to follow and assist as needed  Burna Sis, LCSW Clinical Social Worker [facility] Desk#: [phone] Cell#: [phone]

## 2021-10-10 ENCOUNTER — Ambulatory Visit (INDEPENDENT_AMBULATORY_CARE_PROVIDER_SITE_OTHER): Payer: Medicaid Other

## 2021-10-10 DIAGNOSIS — I428 Other cardiomyopathies: Secondary | ICD-10-CM

## 2021-10-10 LAB — CUP PACEART REMOTE DEVICE CHECK
Battery Remaining Longevity: 77 mo
Battery Voltage: 2.98 V
Brady Statistic RV Percent Paced: 0 %
Date Time Interrogation Session: 20230125022824
HighPow Impedance: 67 Ohm
Implantable Lead Implant Date: 20180802
Implantable Lead Location: 753860
Implantable Pulse Generator Implant Date: 20180802
Lead Channel Impedance Value: 342 Ohm
Lead Channel Impedance Value: 418 Ohm
Lead Channel Pacing Threshold Amplitude: 0.75 V
Lead Channel Pacing Threshold Pulse Width: 0.4 ms
Lead Channel Sensing Intrinsic Amplitude: 8.5 mV
Lead Channel Sensing Intrinsic Amplitude: 8.5 mV
Lead Channel Setting Pacing Amplitude: 2.5 V
Lead Channel Setting Pacing Pulse Width: 0.4 ms
Lead Channel Setting Sensing Sensitivity: 0.3 mV

## 2021-10-11 ENCOUNTER — Ambulatory Visit (INDEPENDENT_AMBULATORY_CARE_PROVIDER_SITE_OTHER): Payer: Medicaid Other | Admitting: *Deleted

## 2021-10-11 ENCOUNTER — Other Ambulatory Visit: Payer: Self-pay

## 2021-10-11 ENCOUNTER — Other Ambulatory Visit (HOSPITAL_COMMUNITY)
Admission: RE | Admit: 2021-10-11 | Discharge: 2021-10-11 | Disposition: A | Discharge status: Home / Self Care | Payer: Medicaid Other | Source: Ambulatory Visit | Attending: Family Medicine | Admitting: Family Medicine

## 2021-10-11 VITALS — BP 93/72 | HR 92 | Ht 64.0 in | Wt 361.2 lb

## 2021-10-11 DIAGNOSIS — N898 Other specified noninflammatory disorders of vagina: Secondary | ICD-10-CM | POA: Insufficient documentation

## 2021-10-11 NOTE — Progress Notes (Signed)
Here for self swab .States thought she has a yeast infection. States she took monistat 1 day, then [facility] 3 day and she thinks it was better. But states having thick clear discharge. Wants to do self swab to check for std, yeast, bv. States not sexually active but + trich a while ago. Wants to be sure all negative.  Dionis Autry,RN

## 2021-10-11 NOTE — Progress Notes (Signed)
Patient was assessed and managed by nursing staff during this encounter. I have reviewed the chart and agree with the documentation and plan. I have also made any necessary editorial changes.  Griffin Basil, MD 10/11/2021 5:24 PM

## 2021-10-12 LAB — CERVICOVAGINAL ANCILLARY ONLY
Bacterial Vaginitis (gardnerella): POSITIVE — AB
Candida Glabrata: NEGATIVE
Candida Vaginitis: NEGATIVE
Chlamydia: NEGATIVE
Comment: NEGATIVE
Comment: NEGATIVE
Comment: NEGATIVE
Comment: NEGATIVE
Comment: NEGATIVE
Comment: NORMAL
Neisseria Gonorrhea: NEGATIVE
Trichomonas: NEGATIVE

## 2021-10-15 ENCOUNTER — Telehealth: Payer: Self-pay

## 2021-10-15 MED ORDER — METRONIDAZOLE 500 MG PO TABS: 500.0000 mg | ORAL_TABLET | Freq: Two times a day (BID) | ORAL | 0 refills | Status: DC

## 2021-10-15 NOTE — Telephone Encounter (Addendum)
-----   Message from Warden Fillers, MD sent at 10/15/2021 10:02 AM EST ----- Swab positive for BV will offer treatment  Called pt and left message that I am calling about results if she could call the office or respond to MyChart message.  MyChart message sent.    Leonette Nutting  10/15/21

## 2021-10-16 ENCOUNTER — Telehealth: Payer: Self-pay | Admitting: Student-PharmD

## 2021-10-16 NOTE — Telephone Encounter (Signed)
Called patient to touch base on how she is doing with Ozempic 1 mg. She has one dose remaining in this pen. She has noticed having a much smaller appetite and having more nausea. She would like to stay at the 1 mg dose for another month then reconsider going up to 2 mg. She has 1 refill remaining on the 1 mg prescription and we will touch base in 4 weeks to see if she would like to increase to 2 mg at that time.

## 2021-10-17 DIAGNOSIS — Z419 Encounter for procedure for purposes other than remedying health state, unspecified: Secondary | ICD-10-CM | POA: Diagnosis not present

## 2021-10-19 NOTE — Progress Notes (Signed)
Remote ICD transmission.   

## 2021-10-25 ENCOUNTER — Other Ambulatory Visit (HOSPITAL_COMMUNITY): Payer: Self-pay | Admitting: Cardiology

## 2021-11-12 ENCOUNTER — Institutional Professional Consult (permissible substitution): Payer: Medicaid Other | Admitting: Clinical

## 2021-11-13 ENCOUNTER — Telehealth: Payer: Self-pay | Admitting: Student-PharmD

## 2021-11-13 NOTE — Telephone Encounter (Signed)
Called patient to see how she is doing on Ozempic 1 mg. Last month when we talked she wanted to continue on 1 mg for another month before increasing to the max 2 mg weekly dose due to having some nausea. Need to re-discuss tolerability and if she would like to stay on 1 mg or increase the dose.   Unable to reach patient and mailbox is full. Will follow up.

## 2021-11-14 DIAGNOSIS — Z419 Encounter for procedure for purposes other than remedying health state, unspecified: Secondary | ICD-10-CM | POA: Diagnosis not present

## 2021-11-20 ENCOUNTER — Encounter: Payer: Self-pay | Admitting: Student-PharmD

## 2021-11-20 NOTE — Telephone Encounter (Signed)
Called patient but was unable to reach. Will send Mychart message.  ?

## 2021-11-21 ENCOUNTER — Telehealth (HOSPITAL_COMMUNITY): Payer: Self-pay | Admitting: Licensed Clinical Social Worker

## 2021-11-21 NOTE — Telephone Encounter (Signed)
Pt contacted CSW regarding ride to appt tomorrow- ride set up through [employer]. ? ?CSW explained that [facility] will no longer be a problem after 3/20 and that pt will need to utilize Medicaid transport- sent pt the number to set up rides through her Medicaid. ? ?Will continue to follow and assist as needed ? ?Jorge Ny, LCSW ?Clinical Social Worker ?[facility] ?Desk#: [phone] ?Cell#: [phone] ? ?

## 2021-11-22 ENCOUNTER — Telehealth (HOSPITAL_COMMUNITY): Payer: Self-pay | Admitting: Licensed Clinical Social Worker

## 2021-11-22 ENCOUNTER — Encounter (HOSPITAL_COMMUNITY): Payer: Medicaid Other | Admitting: Cardiology

## 2021-11-22 NOTE — Telephone Encounter (Signed)
CSW received email that ride through [employer] was canceled at 11:16am because patient did not show up for the ride. ? ?CSW called pt to see what happened but call unable to go through ? ?CSW texted pt to inform of missed ride and ask her to reach out ASAP to get another ride to come to appt- didn't hear back from pt by time of appt ? ?Jorge Ny, LCSW ?Clinical Social Worker ?[facility] ?Desk#: [phone] ?Cell#: [phone] ? ?

## 2021-11-27 ENCOUNTER — Other Ambulatory Visit: Payer: Self-pay | Admitting: Cardiology

## 2021-11-27 DIAGNOSIS — I5022 Chronic systolic (congestive) heart failure: Secondary | ICD-10-CM

## 2021-11-27 MED ORDER — OZEMPIC (2 MG/DOSE) 8 MG/3ML ~~LOC~~ SOPN: 2.0000 mg | PEN_INJECTOR | SUBCUTANEOUS | 11 refills | Status: DC

## 2021-11-27 NOTE — Telephone Encounter (Signed)
Patient responded to MyChart message with an updated phone number to reach her at. At her request I have updated this number in her chart demographics. She reports doing well on Ozempic 1 mg. Reports only having once instance of nausea since we talked last month. She missed her dose last week due to running out of it. She would like to increase to the max dose of 2 mg weekly. Will send this in to [facility] and plan to call her in 1 month to see how she is tolerating this.  ?

## 2021-11-28 ENCOUNTER — Other Ambulatory Visit (HOSPITAL_COMMUNITY): Payer: Self-pay | Admitting: Cardiology

## 2021-12-15 DIAGNOSIS — Z419 Encounter for procedure for purposes other than remedying health state, unspecified: Secondary | ICD-10-CM | POA: Diagnosis not present

## 2021-12-19 ENCOUNTER — Telehealth: Payer: Self-pay | Admitting: Licensed Clinical Social Worker

## 2021-12-19 NOTE — Telephone Encounter (Signed)
Reminder regarding Medicaid re-certification process was mailed to pt, encouraging her to ensure that an up to date phone number, address and email is registered with DSS so that she receives re-certification paperwork.  ?  ?Ignacia Gentzler, MSW, LCSW ?Clinical Social Worker II ?[facility]  ?[phone]- work cell phone (preferred) ?[phone]- desk phone ?

## 2021-12-25 ENCOUNTER — Telehealth: Payer: Self-pay | Admitting: Student-PharmD

## 2021-12-25 ENCOUNTER — Encounter: Payer: Self-pay | Admitting: Student-PharmD

## 2021-12-25 NOTE — Telephone Encounter (Signed)
Called patient to see how she is doing since increasing Ozempic to 2 mg. Unable to reach patient and unable to leave a voicemail. Will send MyChart message. Also need to schedule 6 month office visit in June with pharmacist.  ?

## 2022-01-01 NOTE — Telephone Encounter (Signed)
2nd attempt to reach patient. Unable to reach and unable to leave a VM.  ?

## 2022-01-08 NOTE — Telephone Encounter (Signed)
3rd/final attempt to reach patient. She also has not read MyChart message yet. She has a year of refills on her Ozempic 2 mg prescription. Would ideally schedule her for a 6 month follow up office visit in June with the pharmacy clinic for updated weight/BP/labs if she is to return my call.  ?

## 2022-01-14 DIAGNOSIS — Z419 Encounter for procedure for purposes other than remedying health state, unspecified: Secondary | ICD-10-CM | POA: Diagnosis not present

## 2022-01-29 ENCOUNTER — Other Ambulatory Visit (HOSPITAL_COMMUNITY): Payer: Self-pay | Admitting: Cardiology

## 2022-01-29 ENCOUNTER — Other Ambulatory Visit (HOSPITAL_COMMUNITY): Payer: Self-pay | Admitting: Adult Health

## 2022-02-01 ENCOUNTER — Telehealth: Payer: Self-pay | Admitting: Pharmacist

## 2022-02-01 MED ORDER — SEMAGLUTIDE (1 MG/DOSE) 4 MG/3ML ~~LOC~~ SOPN: 1.0000 mg | PEN_INJECTOR | SUBCUTANEOUS | 3 refills | Status: DC

## 2022-02-01 NOTE — Telephone Encounter (Signed)
Pt called clinic. Reports not tolerating Ozempic 2mg  well. Has been throwing up a lot. Prefers to decrease back to 1mg  dose which she tolerated better. Updated rx has been sent to pharmacy.

## 2022-02-13 IMAGING — DX DG CHEST 1V PORT
1 series · 1 of 1 positions shown · non-contrast
Comparison: Portable chest 01/18/2021.

CLINICAL DATA: Presents with possible sepsis.

EXAM:
PORTABLE CHEST 1 VIEW

[chest]
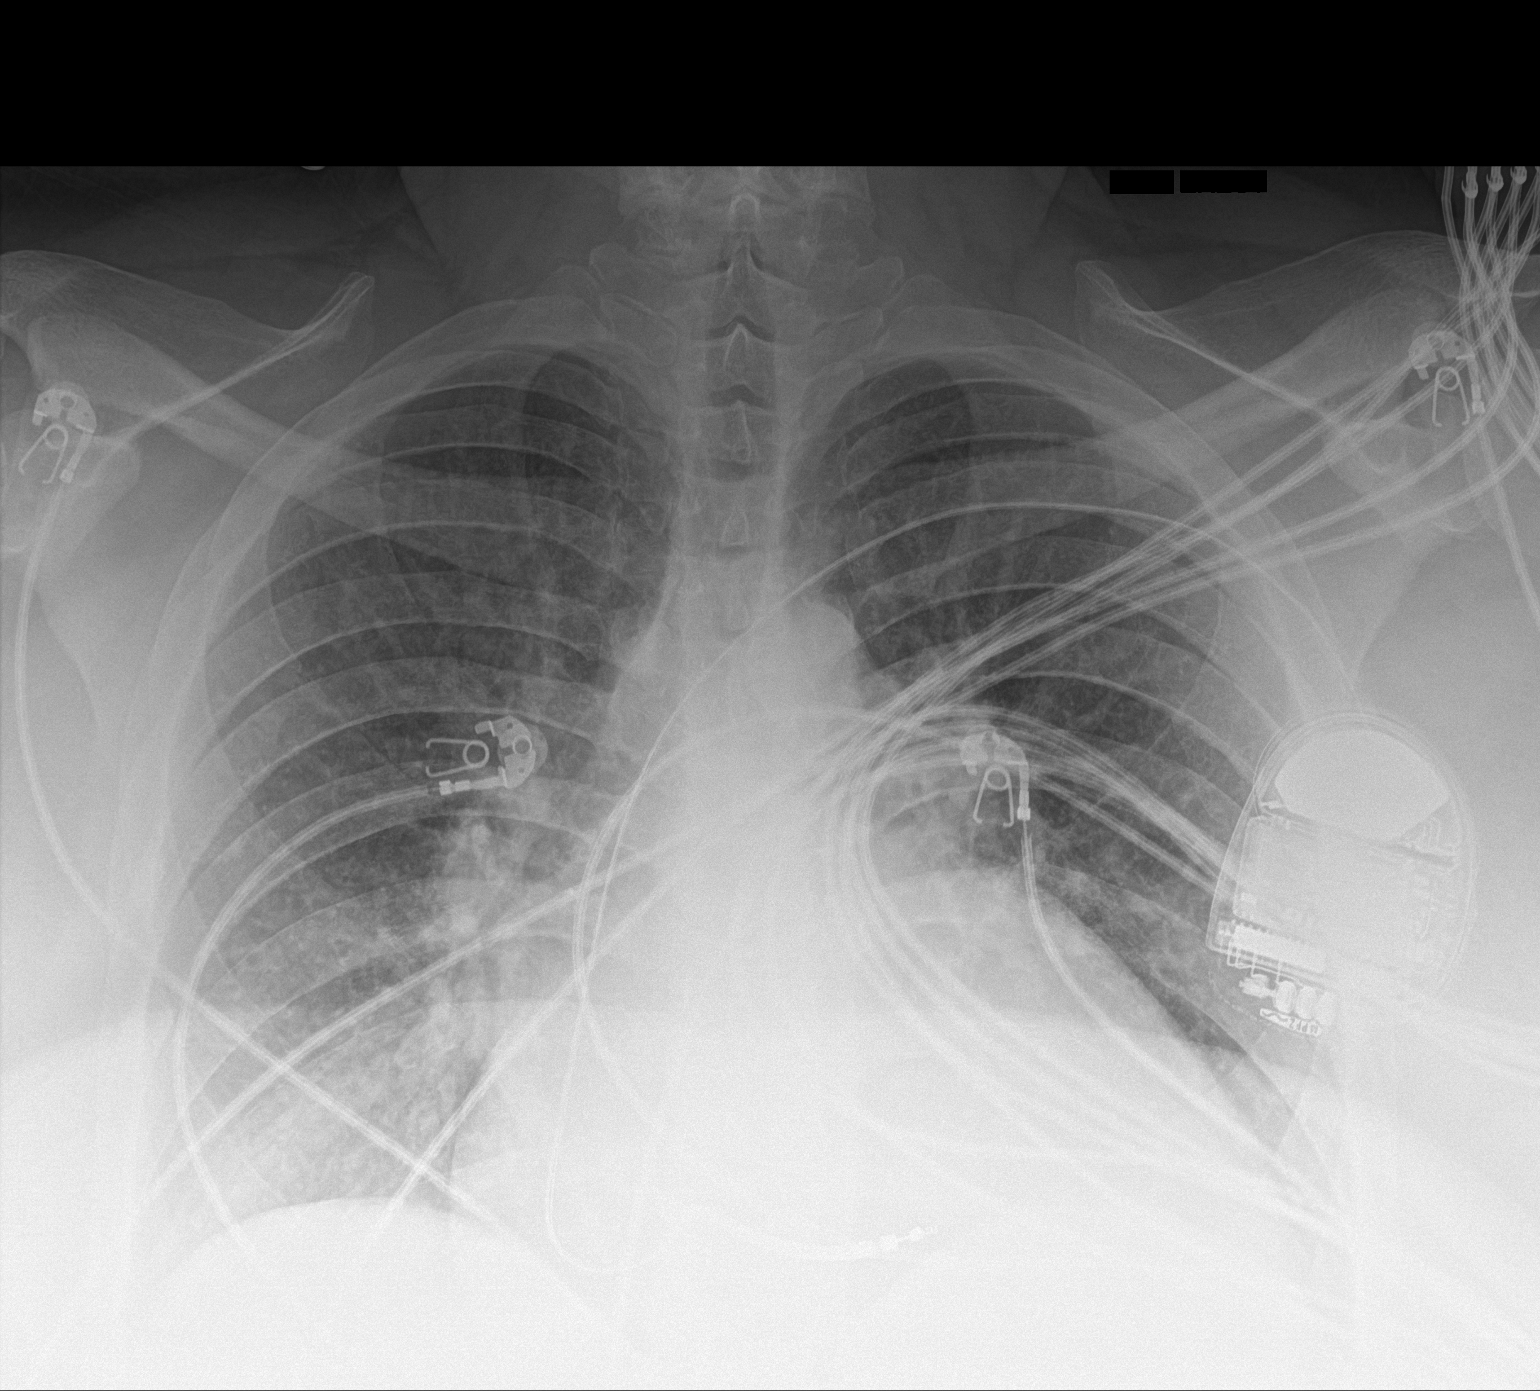

[1 of 1 positions shown; findings below may reference images not displayed]

FINDINGS: There are numerous overlying monitor wires. A left chest single lead
cardiac assist device is again noted with unchanged wire
positioning.

The heart is moderately enlarged which was seen previously. There is
mild perihilar vascular congestion without overt edema. The lungs
are generally clear with limited view of the bases due to the
patient's body habitus. No pleural effusion is seen.
IMPRESSION: Cardiomegaly with mild perihilar vascular prominence, without overt
findings of acute edema. Limited view of the bases.

## 2022-02-14 DIAGNOSIS — Z419 Encounter for procedure for purposes other than remedying health state, unspecified: Secondary | ICD-10-CM | POA: Diagnosis not present

## 2022-02-18 ENCOUNTER — Telehealth (HOSPITAL_COMMUNITY): Payer: Self-pay | Admitting: Licensed Clinical Social Worker

## 2022-02-18 NOTE — Telephone Encounter (Signed)
Pt needing help with ride to clinic appt tomorrow- set up taxi to get pt at 11:30am.  Pt reports she has new number- updated in chart.  She also reports she will be moving back to [place] this weekend to be near family.  Burna Sis, LCSW Clinical Social Worker [facility] Desk#: [phone] Cell#: [phone]

## 2022-02-18 NOTE — Telephone Encounter (Signed)
CSW attempted to call pt to remind of appt tomorrow.  Called but phone states its not receiving calls at this time.  CSW texted pt to remind of appt tomorrow and request return call  Burna Sis, LCSW Clinical Social Worker [facility] Desk#: [phone] Cell#: [phone]

## 2022-02-19 ENCOUNTER — Ambulatory Visit (INDEPENDENT_AMBULATORY_CARE_PROVIDER_SITE_OTHER): Payer: Medicaid Other

## 2022-02-19 ENCOUNTER — Ambulatory Visit (HOSPITAL_COMMUNITY)
Admission: RE | Admit: 2022-02-19 | Discharge: 2022-02-19 | Disposition: A | Discharge status: Home / Self Care | Payer: Medicaid Other | Source: Ambulatory Visit | Attending: Cardiology | Admitting: Cardiology

## 2022-02-19 VITALS — BP 95/50 | HR 106 | Wt 358.0 lb

## 2022-02-19 DIAGNOSIS — Z7901 Long term (current) use of anticoagulants: Secondary | ICD-10-CM | POA: Insufficient documentation

## 2022-02-19 DIAGNOSIS — Z95810 Presence of automatic (implantable) cardiac defibrillator: Secondary | ICD-10-CM | POA: Insufficient documentation

## 2022-02-19 DIAGNOSIS — G4733 Obstructive sleep apnea (adult) (pediatric): Secondary | ICD-10-CM | POA: Insufficient documentation

## 2022-02-19 DIAGNOSIS — Z79899 Other long term (current) drug therapy: Secondary | ICD-10-CM | POA: Insufficient documentation

## 2022-02-19 DIAGNOSIS — Z7984 Long term (current) use of oral hypoglycemic drugs: Secondary | ICD-10-CM | POA: Diagnosis not present

## 2022-02-19 DIAGNOSIS — E1169 Type 2 diabetes mellitus with other specified complication: Secondary | ICD-10-CM

## 2022-02-19 DIAGNOSIS — Z6841 Body Mass Index (BMI) 40.0 and over, adult: Secondary | ICD-10-CM | POA: Diagnosis not present

## 2022-02-19 DIAGNOSIS — I428 Other cardiomyopathies: Secondary | ICD-10-CM | POA: Diagnosis not present

## 2022-02-19 DIAGNOSIS — I5022 Chronic systolic (congestive) heart failure: Secondary | ICD-10-CM | POA: Diagnosis not present

## 2022-02-19 DIAGNOSIS — E119 Type 2 diabetes mellitus without complications: Secondary | ICD-10-CM | POA: Diagnosis not present

## 2022-02-19 DIAGNOSIS — I4892 Unspecified atrial flutter: Secondary | ICD-10-CM | POA: Diagnosis not present

## 2022-02-19 LAB — CBC
HCT: 45.6 % (ref 36.0–46.0)
Hemoglobin: 16 g/dL — ABNORMAL HIGH (ref 12.0–15.0)
MCH: 28 pg (ref 26.0–34.0)
MCHC: 35.1 g/dL (ref 30.0–36.0)
MCV: 79.9 fL — ABNORMAL LOW (ref 80.0–100.0)
Platelets: 264 10*3/uL (ref 150–400)
RBC: 5.71 MIL/uL — ABNORMAL HIGH (ref 3.87–5.11)
RDW: 13.2 % (ref 11.5–15.5)
WBC: 8.7 10*3/uL (ref 4.0–10.5)
nRBC: 0 % (ref 0.0–0.2)

## 2022-02-19 LAB — BASIC METABOLIC PANEL
Anion gap: 12 (ref 5–15)
BUN: 17 mg/dL (ref 6–20)
CO2: 26 mmol/L (ref 22–32)
Calcium: 9.7 mg/dL (ref 8.9–10.3)
Chloride: 96 mmol/L — ABNORMAL LOW (ref 98–111)
Creatinine, Ser: 1.06 mg/dL — ABNORMAL HIGH (ref 0.44–1.00)
GFR, Estimated: >60 mL/min (ref >60)
Glucose, Bld: 367 mg/dL — ABNORMAL HIGH (ref 70–99)
Potassium: 3.7 mmol/L (ref 3.5–5.1)
Sodium: 134 mmol/L — ABNORMAL LOW (ref 135–145)

## 2022-02-19 LAB — DIGOXIN LEVEL: Digoxin Level: 0.5 ng/mL — ABNORMAL LOW (ref 0.8–2.0)

## 2022-02-19 MED ORDER — ATORVASTATIN CALCIUM 10 MG PO TABS: 10.0000 mg | ORAL_TABLET | Freq: Every day | ORAL | 3 refills | Status: AC

## 2022-02-19 MED ORDER — DIGOXIN 125 MCG PO TABS: ORAL_TABLET | ORAL | 3 refills | Status: AC

## 2022-02-19 MED ORDER — APIXABAN 5 MG PO TABS: 5.0000 mg | ORAL_TABLET | Freq: Two times a day (BID) | ORAL | 3 refills | Status: AC

## 2022-02-19 MED ORDER — METOLAZONE 2.5 MG PO TABS: 2.5000 mg | ORAL_TABLET | ORAL | 3 refills | Status: AC

## 2022-02-19 MED ORDER — SPIRONOLACTONE 25 MG PO TABS: 25.0000 mg | ORAL_TABLET | Freq: Every day | ORAL | 3 refills | Status: AC

## 2022-02-19 MED ORDER — TORSEMIDE 100 MG PO TABS: 100.0000 mg | ORAL_TABLET | Freq: Two times a day (BID) | ORAL | 3 refills | Status: AC

## 2022-02-19 MED ORDER — ENTRESTO 97-103 MG PO TABS: ORAL_TABLET | ORAL | 3 refills | Status: AC

## 2022-02-19 MED ORDER — MAGNESIUM OXIDE -MG SUPPLEMENT 400 (240 MG) MG PO TABS
ORAL_TABLET | ORAL | 3 refills | Status: AC
Start: 2022-02-19 — End: ?

## 2022-02-19 MED ORDER — POTASSIUM CHLORIDE 20 MEQ/15ML (10%) PO SOLN: ORAL | 6 refills | Status: AC

## 2022-02-19 MED ORDER — IVABRADINE HCL 7.5 MG PO TABS
ORAL_TABLET | ORAL | 3 refills | Status: AC
Start: 2022-02-19 — End: ?

## 2022-02-19 MED ORDER — DAPAGLIFLOZIN PROPANEDIOL 10 MG PO TABS: 10.0000 mg | ORAL_TABLET | Freq: Every morning | ORAL | 3 refills | Status: AC

## 2022-02-19 MED ORDER — CARVEDILOL 12.5 MG PO TABS: ORAL_TABLET | ORAL | 3 refills | Status: AC

## 2022-02-19 NOTE — Patient Instructions (Signed)
There has been no changes to your medications  Labs done today, your results will be available in MyChart, we will contact you for abnormal readings.  Please find a family doctor and a cardiologist as soon as you can once you move.    Please don't hesitate to contact us if you need anything or decide to move back to the area    If you have any questions or concerns before your next appointment please send [place] a message through [place] or call our office at [phone].    TO LEAVE A MESSAGE FOR THE NURSE SELECT OPTION 2, PLEASE LEAVE A MESSAGE INCLUDING: YOUR NAME DATE OF BIRTH CALL BACK NUMBER REASON FOR CALL**this is important as we prioritize the call backs  YOU WILL RECEIVE A CALL BACK THE SAME DAY AS LONG AS YOU CALL BEFORE 4:00 PM  At the [facility], you and your health needs are our priority. As part of our continuing mission to provide you with exceptional heart care, we have created designated Provider Care Teams. These Care Teams include your primary Cardiologist (physician) and Advanced Practice Providers (APPs- Physician Assistants and Nurse Practitioners) who all work together to provide you with the care you need, when you need it.   You may see any of the following providers on your designated Care Team at your next follow up: Dr Arvilla Meres Dr Carron Curie, NP Robbie Lis, [place] [facility], [place] Karle Plumber, PharmD   Please be sure to bring in all your medications bottles to every appointment.

## 2022-02-19 NOTE — Progress Notes (Signed)
[facility] Note   PCP: Ladell Pier, MD PCP-Cardiologist: Dr. Ronney Lion GYN : Dr Norman Herrlich.   HPI: 41 y.o. 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   She presented to the ER on 06/17/19 with several days of worsening HF with SOB at rest, marked edema and orthopnea/PND, not responding to increased doses of oral diuretics. She admitted to high levels of water and Gatorade intake. Her admit wt was 380 lb (Dry wt ~360 lb). She was started on IV Lasix but had initial poor urinary response. PO metolazone added as adjunct. Echo showed  EF < 20%, relatively normal RV, moderate MR. [place] 06/21/19 with low output (CI 1.8) and elevated PCWP but near normal RA pressure. She was started on inotropic therapy w/ milrinone for low output, 0.25 mcg/kg/min. Also treated w/ Entresto, Coreg, spironolactone, digoxin and ivabradine. She was later transitioned off of IV Lasix to po torsemide 80 mg qam/60 mg qpm.  Unfortunately, she was not a transplant candidate due to her morbid obesity. We discussed her at [facility] with Dr. Prescott Gum.  She will need significant weight loss to get to LVAD. Will use home milrinone to try to facilitate increased activity to work towards weight loss.  She understands that milrinone is not a good end-point and that we would like to use it as a bridge to eventual LVAD. RV function seems adequate for LVAD. Tunneled catheter was placed for home milrinone and home health was arranged. She was discharged home on 06/25/19 on milrinone 0.25 mcg/kg/min.   06/27/19 she presented to the [facility] w/ CC of palpitations. Was brought in by EMS and was reportedly in SVT with HR in the 180s. She was given adenosine and converted to NSR prior to arrival to the ED.  She now has an IUD for contraception.  Echo (5/22) EF 20-25%. Milrinone weaned to 0.125 mcg/kg/min then stopped.   She returns for followup of CHF.  She is on  semaglutide, weight down 11 lbs.  She seems to be doing fairly well symtomatically.  She can walk around her apartment complex without dyspnea.  She can climb a flight of stairs.  No lightheadedness.  No orthopnea/PND.  Her daughter was murdered about 2 months ago, and she plans to move to [place] to be near family.    MDT device interrogation: fluid index < threshold, no AF/VT.   ECG (personally reviewed): NSR, LVH with repolarization abnormality.   Labs (10/22): K 3.4, creatinine 0.81 Labs (11/22): K 3.4, creatinine 1.08, hgb 14.4 Labs (1/23): K 4.1, creatinine 0.88  PMH:  1. Chronic systolic CHF: Nonischemic cardiomyopathy.  No family history of cardiomyopathy, no heavy ETOH, cocaine, or amphetamines.  Medtronic ICD.  - Echo (12/16) with EF 20-25%, moderate MR.  - Echo (5/17) with severely dilated LV, EF 10-15%, severe functional MR, mildly decreased RV systolic function. - LHC/RHC (5/17): No significant coronary disease; mean RA 15, PA 61/25 mean 41, mean PCWP 18, CI 1.7 (Fick), PVR 5.4 WU.  - Echo (10/17): EF 20%, severely dilated LV, normal RV size with mildly decreased systolic function, moderate-severe central MR.  - Echo 04/13/17  LVEF 15-20%, Severe LV dilation, Mod LAE, Mild RAE - CPX (5/19): RER 0.95, peak VO2 9.8, VE/VCO2 26 => submaximal but likely severe functional impairement.  - Echo (7/19): EF 20-25%, moderate LV dilation, RV normal size with mildly decreased systolic function, moderate central MR.  -  Echo (10/20): EF < 20%, severe LV enlargement, normal RV>  - RHC (10/20): mean RA 7, PA 65/25, mean PCWP 27, CI 1.8, PVR 3.87, PAPI 5.7.  - Echo (5/22): EF 20-25%, severe LV dilation, RV normal, severe LAE, moderate MR.  2. Morbid obesity.  3. 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  Echo (10/17) with moderate-severe MR. Echo (7/19) with moderate central MR. Echo in 5/22 with moderate MR.  4. OSA: CPAP.  5. SVT 6. Atrial flutter: Noted 10/22.   Current  Outpatient Medications  Medication Sig Dispense Refill   Accu-Chek Softclix Lancets lancets Use as instructed 100 each 12   albuterol (PROVENTIL) (2.5 MG/3ML) 0.083% nebulizer solution Take 3 mLs (2.5 mg total) by nebulization every 6 (six) hours as needed for wheezing or shortness of breath. 75 mL 0   Blood Glucose Monitoring Suppl (ACCU-CHEK GUIDE) w/Device KIT Use as directed 1 kit 0   Blood Glucose Monitoring Suppl (TRUE METRIX METER) w/Device KIT Use as directed 1 kit 0   Blood Pressure Monitoring (3 SERIES BP MONITOR/UPPER ARM) DEVI Check BP Daily 1 each 0   glucose blood (ACCU-CHEK GUIDE) test strip Check blood sugars daily before breakfast. 100 each 12   glucose blood (TRUE METRIX BLOOD GLUCOSE TEST) test strip Use as instructed 100 each 12   levonorgestrel (LILETTA, 52 MG,) 19.5 MCG/DAY IUD IUD 1 each by Intrauterine route once.     Semaglutide, 1 MG/DOSE, 4 MG/3ML SOPN Inject 1 mg into the skin once a week. 9 mL 3   apixaban (ELIQUIS) 5 MG TABS tablet Take 1 tablet (5 mg total) by mouth 2 (two) times daily. 180 tablet 3   atorvastatin (LIPITOR) 10 MG tablet Take 1 tablet (10 mg total) by mouth daily. 90 tablet 3   carvedilol (COREG) 12.5 MG tablet TAKE ONE TABLET BY MOUTH TWICE DAILY (AM+BEDTIME) 180 tablet 3   dapagliflozin propanediol (FARXIGA) 10 MG TABS tablet Take 1 tablet (10 mg total) by mouth in the morning. 90 tablet 3   digoxin (LANOXIN) 0.125 MG tablet TAKE 1 TABLET (0.125 MG TOTAL) BY MOUTH DAILY (NOON) 90 tablet 3   ivabradine (CORLANOR) 7.5 MG TABS tablet TAKE 1 TABLET BY MOUTH 2 (TWO) TIMES DAILY WITH A MEAL (AM+BEDTIME) 180 tablet 3   magnesium oxide (MAG-OX) 400 (240 Mg) MG tablet TAKE 1 TABLET (400 MG TOTAL) BY MOUTH 2 (TWO) TIMES DAILY. (AM+BEDTIME) 180 tablet 3   metolazone (ZAROXOLYN) 2.5 MG tablet Take 1 tablet (2.5 mg total) by mouth once a week. 15 tablet 3   potassium chloride 20 MEQ/15ML (10%) SOLN Take 60 mLs (80 mEq total) by mouth every morning AND 45 mLs (60  mEq total) daily at 12 noon AND 45 mLs (60 mEq total) every evening. 4500 mL 6   sacubitril-valsartan (ENTRESTO) 97-103 MG TAKE 1 TABLET BY MOUTH 2 (TWO) TIMES DAILY. (AM+BEDTIME) 180 tablet 3   spironolactone (ALDACTONE) 25 MG tablet Take 1 tablet (25 mg total) by mouth daily. 90 tablet 3   torsemide (DEMADEX) 100 MG tablet Take 1 tablet (100 mg total) by mouth 2 (two) times daily. 180 tablet 3   No current facility-administered medications for this encounter.   Allergies  Allergen Reactions   Metformin Diarrhea    And caused severe "dry mouth," also   Metformin And Related Diarrhea    And caused severe "dry mouth," also   Social History   Socioeconomic History   Marital status: Single    Spouse name: Not on file  Number of children: 2   Years of education: Not on file   Highest education level: Some college, no degree  Occupational History   Occupation: Disability  Tobacco Use   Smoking status: Former    Types: Cigarettes    Quit date: 07/18/2016    Years since quitting: 5.5   Smokeless tobacco: Never   Tobacco comments:    3 cigarettes per day  Vaping Use   Vaping Use: Never used  Substance and Sexual Activity   Alcohol use: Not Currently    Alcohol/week: 0.0 standard drinks    Comment: occasionally   Drug use: Not Currently    Types: Marijuana    Comment: only socially   Sexual activity: Yes    Birth control/protection: None  Other Topics Concern   Not on file  Social History Narrative   Not on file   Social Determinants of Health   Financial Resource Strain: Not on file  Food Insecurity: Food Insecurity Present   Worried About Charity fundraiser in the Last Year: Sometimes true   Arboriculturist in the Last Year: Sometimes true  Transportation Needs: Unmet Transportation Needs   Lack of Transportation (Medical): No   Lack of Transportation (Non-Medical): Yes  Physical Activity: Not on file  Stress: Not on file  Social Connections: Not on file   Intimate Partner Violence: Not on file   Family History  Problem Relation Age of Onset   Heart attack Mother    Asthma Mother    Heart failure Mother    Hypertension Father    Diabetes Brother    BP (!) 95/50   Pulse (!) 106   Wt (!) 162.4 kg (358 lb)   SpO2 96%   BMI 61.45 kg/m   Wt Readings from Last 3 Encounters:  02/19/22 (!) 162.4 kg (358 lb)  10/11/21 (!) 163.8 kg (361 lb 3.2 oz)  09/25/21 (!) 169.6 kg (374 lb)   PHYSICAL EXAM: General: NAD, obese.  Neck: No JVD, no thyromegaly or thyroid nodule.  Lungs: Clear to auscultation bilaterally with normal respiratory effort. CV: Nondisplaced PMI.  Heart regular S1/S2, no S3/S4, no murmur.  No peripheral edema.  No carotid bruit.  Normal pedal pulses.  Abdomen: Soft, nontender, no hepatosplenomegaly, no distention.  Skin: Intact without lesions or rashes.  Neurologic: Alert and oriented x 3.  Psych: Normal affect. Extremities: No clubbing or cyanosis.  HEENT: Normal.   ASSESSMENT & PLAN: 1. Chronic systolic CHF:  Nonischemic cardiomyopathy.  Body habitus precludes cardiac MRI.  Echo 06/2019 with EF < 20%, relatively normal RV, moderate MR.  [place] 06/21/19 with low output (CI 1.8) and elevated PCWP but near normal RA pressure, started on milrinone 0.25 with plans to continue home milrinone as bridge to possible LVAD if she could achieve significant weight loss.  Repeat echo 5/22 with EF 20-25%. Unfortunately, she has not succeeded in losing weight and has been dismissed by home health because of noncompliance.  She also cannot get home milrinone now through [facility].  Therefore, she was weaned off milrinone and seems to be doing reasonably well.  NYHA class II symptoms.  Not volume overloaded on exam or by Optivol.   - Continue torsemide 100 mg bid + metolazone once weekly q Wed. BMET today.  - Continue Coreg 12.5 mg bid.  - Continue Entresto 97/103 mg bid.   - Continue ivabradine 7.5 mg bid. HR 73 today. - Continue  spironolactone 25 mg daily.  - Continue digoxin  0.125 mg daily.  Check level today.  - Continue dapagliflozin 10 mg daily.  - She needs to prevent pregnancy. She now has an IUD. 2. Morbid obesity: Body mass index is 61.45 kg/m. - Continue semaglutide, weight trending down.  3. Atrial flutter: Noted on 10/22, regular on exam today. No AT/AF on device interrogation. - Continue Eliquis 5 mg bid. CBC today. 4. OSA:  Still needs sleep study.   Moving to [place].  We will try to get her 3 months' worth of her meds.  We discussed the importance of immediately setting up a cardiologist and a PCP in [place].   Loralie Champagne, MD 02/19/22

## 2022-02-20 LAB — CUP PACEART REMOTE DEVICE CHECK
Battery Remaining Longevity: 63 mo
Battery Voltage: 2.97 V
Brady Statistic RV Percent Paced: 0 %
Date Time Interrogation Session: 20230606122033
HighPow Impedance: 77 Ohm
Implantable Lead Implant Date: 20180802
Implantable Lead Location: 753860
Implantable Pulse Generator Implant Date: 20180802
Lead Channel Impedance Value: 342 Ohm
Lead Channel Impedance Value: 418 Ohm
Lead Channel Pacing Threshold Amplitude: 0.75 V
Lead Channel Pacing Threshold Pulse Width: 0.4 ms
Lead Channel Sensing Intrinsic Amplitude: 10.25 mV
Lead Channel Sensing Intrinsic Amplitude: 10.25 mV
Lead Channel Setting Pacing Amplitude: 2.5 V
Lead Channel Setting Pacing Pulse Width: 0.4 ms
Lead Channel Setting Sensing Sensitivity: 0.3 mV

## 2022-03-05 NOTE — Progress Notes (Signed)
Remote ICD transmission.   

## 2022-03-16 DIAGNOSIS — Z419 Encounter for procedure for purposes other than remedying health state, unspecified: Secondary | ICD-10-CM | POA: Diagnosis not present

## 2022-04-16 DIAGNOSIS — Z419 Encounter for procedure for purposes other than remedying health state, unspecified: Secondary | ICD-10-CM | POA: Diagnosis not present

## 2022-04-24 ENCOUNTER — Encounter (INDEPENDENT_AMBULATORY_CARE_PROVIDER_SITE_OTHER): Payer: Self-pay

## 2022-06-19 ENCOUNTER — Telehealth (HOSPITAL_COMMUNITY): Payer: Self-pay | Admitting: *Deleted

## 2022-06-19 NOTE — Telephone Encounter (Signed)
Records faxed to [phone] as requested by patient.

## 2022-06-20 ENCOUNTER — Encounter: Admit: 2022-06-20 | Payer: PRIVATE HEALTH INSURANCE

## 2022-07-10 ENCOUNTER — Ambulatory Visit: Admit: 2022-07-10 | Payer: MEDICAID | Attending: Student in an Organized Health Care Education/Training Program

## 2022-07-10 DIAGNOSIS — I509 Heart failure, unspecified: Secondary | ICD-10-CM

## 2022-07-10 MED ORDER — DIGOXIN 125 MCG (0.125 MG) TABLET
0.125 mg | ORAL_TABLET | Freq: Every day | ORAL | 12 refills | Status: AC
Start: 2022-07-10 — End: ?

## 2022-07-10 MED ORDER — OZEMPIC SUBQ
SUBCUTANEOUS | Status: AC
Start: 2022-07-10 — End: ?

## 2022-07-10 MED ORDER — CARVEDILOL IMMEDIATE RELEASE 12.5 MG TABLET
12.5 mg | ORAL_TABLET | Freq: Two times a day (BID) | ORAL | 4 refills | Status: AC
Start: 2022-07-10 — End: ?

## 2022-07-10 MED ORDER — MAGNESIUM GLUCONATE 27.5 MG MAGNESIUM (500 MG) TABLET
27.5 mg magne- sium (500 mg) | Freq: Every day | ORAL | Status: AC
Start: 2022-07-10 — End: ?

## 2022-07-10 MED ORDER — DIGOXIN 125 MCG (0.125 MG) TABLET
0.125 mg | Freq: Every day | ORAL | Status: AC
Start: 2022-07-10 — End: 2022-07-10

## 2022-07-10 MED ORDER — DAPAGLIFLOZIN PROPANEDIOL 10 MG TABLET
10 mg | ORAL_TABLET | Freq: Every day | ORAL | 4 refills | Status: AC
Start: 2022-07-10 — End: ?

## 2022-07-10 MED ORDER — TORSEMIDE 100 MG TABLET
100 mg | ORAL_TABLET | Freq: Every day | ORAL | 4 refills | Status: AC
Start: 2022-07-10 — End: ?

## 2022-07-10 MED ORDER — CARVEDILOL IMMEDIATE RELEASE 12.5 MG TABLET
12.5 mg | ORAL | Status: AC
Start: 2022-07-10 — End: 2022-07-10

## 2022-07-10 MED ORDER — METOLAZONE 2.5 MG TABLET
2.5 mg | ORAL_TABLET | ORAL | 4 refills | Status: AC
Start: 2022-07-10 — End: ?

## 2022-07-10 MED ORDER — MISCELLANEOUS MEDICAL SUPPLY MISC
1 refills | Status: AC
Start: 2022-07-10 — End: ?

## 2022-07-10 MED ORDER — TORSEMIDE 100 MG TABLET
100 mg | Freq: Every day | ORAL | Status: AC
Start: 2022-07-10 — End: 2022-07-10

## 2022-07-10 MED ORDER — ENTRESTO 24 MG-26 MG TABLET
24-26 mg | ORAL_TABLET | Freq: Two times a day (BID) | ORAL | 12 refills | Status: AC
Start: 2022-07-10 — End: ?

## 2022-07-10 MED ORDER — APIXABAN 5 MG TABLET
5 mg | ORAL_TABLET | Freq: Two times a day (BID) | ORAL | 3 refills | Status: AC
Start: 2022-07-10 — End: 2022-09-06

## 2022-07-10 MED ORDER — SPIRONOLACTONE 25 MG TABLET
25 mg | ORAL_TABLET | Freq: Every day | ORAL | 4 refills | Status: AC
Start: 2022-07-10 — End: ?

## 2022-07-10 MED ORDER — SPIRONOLACTONE 25 MG TABLET
25 mg | Freq: Every day | ORAL | Status: AC
Start: 2022-07-10 — End: 2022-07-10

## 2022-07-10 MED ORDER — HIC 2000028712 SACUBITRIL-VALSARTAN (ENTRESTO) 97-103 MG TABLET
ORAL | Status: AC
Start: 2022-07-10 — End: 2022-07-10

## 2022-07-10 MED ORDER — ATORVASTATIN 10 MG TABLET
10 mg | Freq: Every day | ORAL | Status: AC
Start: 2022-07-10 — End: ?

## 2022-07-10 MED ORDER — DAPAGLIFLOZIN PROPANEDIOL 10 MG TABLET
10 mg | Freq: Every day | ORAL | Status: AC
Start: 2022-07-10 — End: 2022-07-10

## 2022-07-10 MED ORDER — IVABRADINE 5 MG TABLET
5 mg | ORAL_TABLET | Freq: Two times a day (BID) | ORAL | 4 refills | Status: AC
Start: 2022-07-10 — End: ?

## 2022-07-11 NOTE — Other
Heart Failure Program    [facility] - Jessy Oto, MD, MPH Theo Dills, MD Nada Maclachlan, MD Zella Richer, MD Lajoyce Corners, MD Egbert Garibaldi, MD Ladona Horns, MD Prince Solian, MD Shelle Iron, MD Cheron Schaumann, MD, MTR Vernona Rieger, MD, [facility] Idelle Crouch, MD New Patient VisitPrimary Care Provider: Doristine Church HPI Holly Bond is a 41 y.o. 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  She was previously seen by Dr. Nedra Hai, last in 2014 after which time she moved to [facility].  Since 2014 she is been followed by the advanced heart failure team there.  She recently moved back to [place] after the recent death of her daughter and currently plans to stay here for the foreseeable future.  Because of her recent move she is working to reestablish care with advanced heart failure team as well as Ob/gyn and a PCP for ongoing monitoring.She presents today noting that she has recently ran out of all of her heart failure medications for about a week and a half now.  All she has her baby is her torsemide and magnesium pills which she says she is been using sparingly.  She currently denies any exertional chest pain however she does not note exertional shortness of breath, bilateral knee pain, and exertional fatigue.  She does note intermittent orthostatic symptoms primarily when getting out of bed in the morning.  She also reports mild orthopnea and PND however she denies any worsening lower extremity edema.  She currently does not track her weight at home she does not have a scale and she does not monitor her blood pressure although she says she does have a blood pressure cuff at home.She currently reports that her exercise she walks around her block however she is mainly limited by bilateral knee pain.  Since the death of her daughter she has noticed significant weight gain.  At the same time she is also not had a recent Ozempic injection and attributes her subjective weight gain to these 2 factors.  On review of her chart, she was last seen by her advanced heart failure team on 02/19/2022.  During this visit her past history is reviewed including a recent hospital admission requiring the patient to be discharged home on Milrinone support.  Milrinone was subsequently weaned off in the following weeks.  She has previously been discussed regarding advanced therapies by her advanced heart failure team.  She was considered not to be a transplant candidate due to her BMI.  She was also not considered to be an LVAD candidate for the same reason as well.  On discussion with the patient she also reports intermittent marijuana use as well as alcohol use.Review of Systems:Review of Systems Constitutional: Negative for chills, diaphoresis, fever and malaise/fatigue. HENT: Negative.  Eyes: Negative.  Respiratory: Positive for shortness of breath. Negative for cough, sputum production and wheezing.  Cardiovascular: Negative for chest pain, palpitations, orthopnea, claudication, leg swelling and PND. Gastrointestinal: Negative for abdominal pain, heartburn, nausea and vomiting. Musculoskeletal: Positive for joint pain. Skin: Negative.  Neurological: Negative.  Endo/Heme/Allergies: Negative.  Past Medical History:Arissa Poss has no past medical history on file.No past surgical history on file.Medications, Allergies, FH, SH: Current medications include atorvastatin,magnesium gluconate,semaglutide (OZEMPIC SUBQ),carvediloL,dapagliflozin propanediol,digoxin,furosemide,HIC [id number] sacubitril-valsartan (ENTRESTO) 97-103 mg tablet,metoprolol tartrate,spironolactone,torsemide,cephALEXin,Flovent HFA,fluconazole,HYDROcodone-acetaminophen,valACYclovir,Proventil HFA.She has No Known Allergies.No family history on file.Social History Tobacco Use ? Smoking status: Every Day   Current packs/day: 0.00  Objective Objective  BP 90/62 (Site: l a, Position: Sitting, Cuff Size: X-Large)  - Pulse (!) 91  - Ht 5' 5 (1.651 m)  - Wt (!) 166.1 kg  - SpO2 96%  - BMI 60.94 kg/m? Wt: 07/10/22 (!) 166.1 kg 11/04/12 (!) 157.4 kg 09/23/12 (!) 158.8 kg Body mass index is 60.94 kg/m?Marland KitchenRelevant physical exam findings include the following:Vitals reviewed. Constitutional:     Appearance: Healthy appearance. Not in distress. Eyes:    Conjunctiva/sclera: Conjunctivae normal.    Pupils: Pupils are equal, round, and reactive to light. HENT:    Nose: Nose normal. No nasal discharge.  Mouth/Throat:    Dentition: Normal.    Pharynx: Oropharynx is clear. Neck:    Vascular: JVD normal. Pulmonary:    Effort: Pulmonary effort is normal.    Breath sounds: No stridor. Decreased breath sounds present. Wheezing present. No rhonchi. No rales. Chest:    Chest wall: Not tender to palpatation. Cardiovascular:    PMI at left midclavicular line. Normal rate. Regular rhythm.    Murmurs: There is no murmur.    No gallop. No click. No rub. Pulses:   Intact distal pulses. Edema:   Peripheral edema absent. Abdominal:    General: Bowel sounds are normal. Musculoskeletal: Normal range of motion.    Cervical back: Normal range of motion. Skin:   General: Skin is warm and dry. Neurological:    Mental Status: Alert and oriented to person, place and time. Most recent relevant studies reviewed this visit:CBC RENAL LIVER Lab Results Component Value Date  WBC 6.8 12/05/2010  HGB 14.4 12/05/2010  HCT 42.2 12/05/2010  PLT 252 12/05/2010  Lab Results Component Value Date  NA 133 (L) 11/13/2011  NA 137 12/05/2010  K cancelled 11/13/2011  K 3.8 12/05/2010  CL 103 11/13/2011  CL 104 12/05/2010  CO2 23.7 11/13/2011  CO2 23.0 12/05/2010  BUN 11 11/13/2011  BUN 11 12/05/2010  CREATININE 0.8 11/13/2011  CREATININE 0.9 12/05/2010  Total Bilirubin Date Value Ref Range Status 11/13/2011 1.25 (H) <1.20 mg/dL Final Alanine Aminotransferase (ALT) Date Value Ref Range Status 11/13/2011 12 0 - 34 U/L Final Aspartate Aminotransferase (AST) Date Value Ref Range Status 11/13/2011 16 0 - 34 U/L Final Alkaline Phosphatase Date Value Ref Range Status 11/13/2011 61 30 - 130 U/L Final   LIPID PANEL AND HBA1C CARDIAC ENZYMES COAGULATION  No results found for: HGBA1CNo results found for: CHOL, TRIG, HDL, LDL No results found for: TROPONINI  Lab Results Component Value Date  INR 0.92 12/05/2010 Lab Results Component Value Date  PTT 27.4 12/05/2010  EKG (07/10/2022):Sinus rhythm at 91 beats per minute, probable left atrial enlargement, left bundle-branch block.TTE (11/04/2012): Severely increased left ventricular size. Mild concentric  hypertrophy. Severe global systolic dysfunction. Estimated ejection  fraction by 2D Simpson's is 29 %. ? Normal right ventricular size and systolic function. Estimated right  ventricular systolic pressure is 45 mmHg.? Severe left atrial dilatation.? At least moderate mitral regurgitation. Mild tricuspid regurgitation.? Mildly plethoric inferior vena cava but normal respiratory variation,  consistent with mildly increased right atrial pressure.? No significant pericardial effusion.? Compared with images from prior study of 11/07/10, estimated ejection  fraction has decreased slightly from 35%. Assessment and Recommendations Holly Bond is a 41 y.o. 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  She was previously seen by Dr. Nedra Hai, last in 2014 and has been living in [place] since then.  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  - Will refill the majority of her GDMT with minor alterations due to her being off of them for the last 1.5 weeks. Will refill Coreg 12.5 mg BID, Entresto 24/26 (down from 97/103), Farxiga 10, and Aldactone 25 to complete quad therapy.- Will refill Torsemide 100 mg daily (she reports only taking it daily rather than BID) and Metolazone 2.5mg  every Wednesday.- Will refill Ivabradine 5mg , digoxin 0.125mg  daily, and Eliquis 5mg  BID.- Will also order routine bloodwork to re-establish a new baseline.- Will also repeat TTE.- She reports that she no longer has any device company following her ICD readings. Will place referral today for EP/device clinic.Morbid Obesity: BMI today 60.94. Patient reports recent weight gain during the grieving process and due to a lack of ozempic.- Patient will establish care with a new PCP to facilitate Ozempic refill.Consideration for Advanced Therapies:- Current barriers to transplant include BMI, current marijuana/alcohol use.- Current barriers to LVAD include BMIVisit Summary - Refilled medications, ordered bloodwork to establish a new baseline.- Will obtain TTE.- Referred to EP/device clinic to establish device monitoring.Orders Placed This Encounter Procedures ? NT-proBrain natriuretic peptide   Standing Status:   Future   Number of Occurrences:   1   Standing Expiration Date:   07/11/2023   Order Specific Question:   Release to patient   Answer:   Immediate ? Hemoglobin A1C   Standing Status:   Future   Number of Occurrences:   1   Standing Expiration Date:   07/11/2023   Order Specific Question:   Release to patient   Answer:   Immediate ? LIPID PANEL WITH REFLEX TO DIRECT LDL     (Q)   Standing Status:   Future   Number of Occurrences:   1   Standing Expiration Date:   07/11/2023   Order Specific Question:   Release to patient   Answer:   Immediate ? Ambulatory referral to Cardiac Electrophysiology   Referral Priority:   Routine   Referral Type:   Consultation   Referral Reason:   Specialty Services Required   Requested Specialty:   Cardiovascular Disease   Number of Visits Requested:   1 ? EKG (Clinic Performed)   Order Specific Question:   Reason For EKG   Answer:   Abnormal EKG ? Echo 2D Complete w Doppler and CFI if Ind Image Enhancement 3D and or bubbles   Standing Status:   Future   Standing Expiration Date:   07/11/2023   Order Specific Question:   Reason for Exam:   Answer:   h/o NICM   Order Specific Question:   Release to patient   Answer:   Immediate   Order Specific Question:   Is this study related to any of the following?   Answer:   None are Applicable ? COMPREHENSIVE METABOLIC PANEL   Standing Status:   Future   Number of Occurrences:   1   Standing Expiration Date:   07/11/2023   Order Specific Question:   Release to patient   Answer:   Immediate ? CBC AND DIFFERENTIAL   Standing Status:   Future   Number of Occurrences:   1   Standing Expiration Date:   07/11/2023   Order Specific Question:   Release to patient   Answer:   Immediate  FOLLOW-UP: Return in about 3 weeks (around 07/31/2022).Idelle Crouch, MDYale Heart Failure ProgramCardiovascular  Medicine - Heart and Vascular CenterYale School of Medicine - [facility] HospitalEmail: [email or web address] educates and nurtures Recruitment consultant in medicine and science, promoting curiosity and critical inquiry in an inclusive environment enriched by diversity.We advance discovery and innovation fostered by partnerships across the [place], our [employer], and the world.We care for patients with compassion, and commit to improving the health of all people.

## 2022-07-16 ENCOUNTER — Telehealth (HOSPITAL_COMMUNITY): Payer: Self-pay

## 2022-07-16 NOTE — Telephone Encounter (Signed)
Received a fax requesting medical records from [facility]. Records were successfully mailed to: [facility] and Health Information Management Release of Information Services  [address] [place], [place] [address],which was the number provided.. Medical request form will be scanned into patients chart.

## 2022-07-23 ENCOUNTER — Inpatient Hospital Stay: Admit: 2022-07-23 | Payer: PRIVATE HEALTH INSURANCE

## 2022-08-14 ENCOUNTER — Ambulatory Visit: Admit: 2022-08-14 | Payer: MEDICAID | Attending: Student in an Organized Health Care Education/Training Program

## 2022-08-15 ENCOUNTER — Emergency Department: Admit: 2022-08-15 | Payer: MEDICAID

## 2022-08-15 ENCOUNTER — Inpatient Hospital Stay: Admit: 2022-08-15 | Discharge: 2022-08-15 | Payer: MEDICAID | Attending: Emergency Medicine

## 2022-08-15 DIAGNOSIS — J029 Acute pharyngitis, unspecified: Secondary | ICD-10-CM

## 2022-08-15 DIAGNOSIS — H9201 Otalgia, right ear: Secondary | ICD-10-CM

## 2022-08-15 DIAGNOSIS — R0602 Shortness of breath: Secondary | ICD-10-CM

## 2022-08-15 DIAGNOSIS — I428 Other cardiomyopathies: Secondary | ICD-10-CM

## 2022-08-15 DIAGNOSIS — I509 Heart failure, unspecified: Secondary | ICD-10-CM

## 2022-08-15 DIAGNOSIS — J45901 Unspecified asthma with (acute) exacerbation: Principal | ICD-10-CM

## 2022-08-15 LAB — CBC WITH AUTO DIFFERENTIAL
BKR WAM ABSOLUTE IMMATURE GRANULOCYTES.: 0.08 x 1000/ÂµL (ref 0.00–0.30)
BKR WAM ABSOLUTE LYMPHOCYTE COUNT.: 2.28 x 1000/ÂµL (ref 0.60–3.70)
BKR WAM ABSOLUTE NRBC (2 DEC): 0 x 1000/ÂµL (ref 0.00–1.00)
BKR WAM ANALYZER ANC: 8.16 x 1000/ÂµL — ABNORMAL HIGH (ref 2.00–7.60)
BKR WAM BASOPHIL ABSOLUTE COUNT.: 0.03 x 1000/ÂµL (ref 0.00–1.00)
BKR WAM BASOPHILS: 0.3 % (ref 0.0–1.4)
BKR WAM EOSINOPHIL ABSOLUTE COUNT.: 0.06 x 1000/ÂµL (ref 0.00–1.00)
BKR WAM EOSINOPHILS: 0.5 % (ref 0.0–5.0)
BKR WAM HEMATOCRIT (2 DEC): 36.3 % (ref 35.00–45.00)
BKR WAM HEMOGLOBIN: 12.5 g/dL (ref 11.7–15.5)
BKR WAM IMMATURE GRANULOCYTES: 0.7 % (ref 0.0–1.0)
BKR WAM LYMPHOCYTES: 19.8 % (ref 17.0–50.0)
BKR WAM MCH (PG): 26.9 pg — ABNORMAL LOW (ref 27.0–33.0)
BKR WAM MCHC: 34.4 g/dL (ref 31.0–36.0)
BKR WAM MCV: 78.2 fL — ABNORMAL LOW (ref 80.0–100.0)
BKR WAM MONOCYTE ABSOLUTE COUNT.: 0.93 x 1000/ÂµL (ref 0.00–1.00)
BKR WAM MONOCYTES: 8.1 % (ref 4.0–12.0)
BKR WAM MPV: 11.8 fL (ref 8.0–12.0)
BKR WAM NEUTROPHILS: 70.6 % (ref 39.0–72.0)
BKR WAM NUCLEATED RED BLOOD CELLS: 0 % (ref 0.0–1.0)
BKR WAM PLATELETS: 227 x1000/ÂµL (ref 150–420)
BKR WAM RDW-CV: 13.5 % (ref 11.0–15.0)
BKR WAM RED BLOOD CELL COUNT.: 4.64 M/ÂµL (ref 4.00–6.00)
BKR WAM WHITE BLOOD CELL COUNT: 11.5 x1000/ÂµL — ABNORMAL HIGH (ref 4.0–11.0)

## 2022-08-15 LAB — BASIC METABOLIC PANEL
BKR ANION GAP: 12 (ref 7–17)
BKR BLOOD UREA NITROGEN: 8 mg/dL (ref 6–20)
BKR BUN / CREAT RATIO: 11.4 (ref 8.0–23.0)
BKR CALCIUM: 9.2 mg/dL (ref 8.8–10.2)
BKR CHLORIDE: 95 mmol/L — ABNORMAL LOW (ref 98–107)
BKR CO2: 26 mmol/L (ref 20–30)
BKR CREATININE: 0.7 mg/dL (ref 0.40–1.30)
BKR EGFR, CREATININE (CKD-EPI 2021): >60 mL/min/{1.73_m2} (ref >=60)
BKR GLUCOSE: 323 mg/dL — ABNORMAL HIGH (ref 70–100)
BKR POTASSIUM: 3.6 mmol/L (ref 3.3–5.3)
BKR SODIUM: 133 mmol/L — ABNORMAL LOW (ref 136–144)

## 2022-08-15 LAB — INFLUENZA A+B/RSV BY RT-PCR
BKR INFLUENZA A: NEGATIVE
BKR INFLUENZA B: NEGATIVE
BKR RESPIRATORY SYNCYTIAL VIRUS: NEGATIVE

## 2022-08-15 LAB — NT-PROBNPE: BKR B-TYPE NATRIURETIC PEPTIDE, PRO (PROBNP): 503 pg/mL — ABNORMAL HIGH (ref <125.0)

## 2022-08-15 LAB — SARS COV-2 (COVID-19) RNA: BKR SARS-COV-2 RNA (COVID-19) (YH): NEGATIVE

## 2022-08-15 MED ORDER — IPRATROPIUM 0.5 MG-ALBUTEROL 3 MG (2.5 MG BASE)/3 ML NEBULIZATION SOLN
0.5 mg-3 mg(2.5 mg base)/3 mL | RESPIRATORY_TRACT | Status: CP
Start: 2022-08-15 — End: ?
  Administered 2022-08-15 (×3): 0.5 mL via RESPIRATORY_TRACT

## 2022-08-15 MED ORDER — PREDNISONE 10 MG TABLET
10 mg | ORAL_TABLET | Freq: Every day | ORAL | 1 refills | Status: AC
Start: 2022-08-15 — End: ?

## 2022-08-15 MED ORDER — ALBUTEROL SULFATE 2.5 MG/3 ML (0.083 %) SOLUTION FOR NEBULIZATION
2.5 mg /3 mL (0.083 %) | Freq: Four times a day (QID) | RESPIRATORY_TRACT | 1 refills | Status: AC | PRN
Start: 2022-08-15 — End: ?

## 2022-08-15 MED ORDER — TORSEMIDE 100 MG TABLET
100 mg | Freq: Every day | ORAL | Status: DC
Start: 2022-08-15 — End: 2022-08-16

## 2022-08-15 MED ORDER — ZZ IMS TEMPLATE
Freq: Every day | ORAL | Status: DC
Start: 2022-08-15 — End: 2022-08-16
  Administered 2022-08-15: 16:00:00 1 mg via ORAL

## 2022-08-15 MED ORDER — ZZ IMS TEMPLATE
Freq: Every day | ORAL | Status: DC
Start: 2022-08-15 — End: 2022-08-15

## 2022-08-15 MED ORDER — IPRATROPIUM 0.5 MG-ALBUTEROL 3 MG (2.5 MG BASE)/3 ML NEBULIZATION SOLN
0.5 mg-3 mg(2.5 mg base)/3 mL | Freq: Once | RESPIRATORY_TRACT | Status: CP
Start: 2022-08-15 — End: ?
  Administered 2022-08-15: 16:00:00 0.5 mL via RESPIRATORY_TRACT

## 2022-08-15 MED ORDER — ALBUTEROL SULFATE HFA 90 MCG/ACTUATION AEROSOL INHALER
90 mcg/actuation | Freq: Four times a day (QID) | RESPIRATORY_TRACT | 1 refills | Status: AC | PRN
Start: 2022-08-15 — End: ?

## 2022-08-15 MED ORDER — DEXTROMETHORPHAN-GUAIFENESIN 10 MG-100 MG/5 ML ORAL SYRUP
10-100 mg/5 mL | ORAL | Status: DC | PRN
Start: 2022-08-15 — End: 2022-08-16
  Administered 2022-08-15: 19:00:00 10-100 mL via ORAL

## 2022-08-15 NOTE — ED Notes
3:19 PM symptoms improved post meds

## 2022-08-16 DIAGNOSIS — Z419 Encounter for procedure for purposes other than remedying health state, unspecified: Secondary | ICD-10-CM | POA: Diagnosis not present

## 2022-08-18 NOTE — ED Provider Notes
Chief Complaint Patient presents with ? Shortness of Breath   Pt c/o shortness of breath, sore throat, chest congestion and right ear pain x 1 week.  Pt states the shortness of breath is worsening.  Pt needed to use Albuterol nebulizer frequently.  +hx of CHF.  +dyspnea at triage.  Denies f/c.   HPI/PE:HPI:Patient is a 41 y.o. 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  Patient states she is been experiencing URI-like symptoms as described below for the past week progressively worsening shortness of breath.  Patient attempted treatment with home inhalers without improvement leading to her presentation today.  Patient denies sick contacts.ZOX:WRUEAVW is hemodynamically stable and does not appear to be in acute respiratory distress. Exam significant for diffuse wheezing, no lower extremity edema or other signs of hypervolemia noted.  Considered asthma exacerbation secondary to URI, albuterol/prednisone ordered, viral panel ordered.  Viral panel negative, chest x-ray shows no acute opacifications on my independent interpretation.  Patient has significant improvement of symptoms and wheezing on auscultation status post neb treatment.  Low suspicion for CHF exacerbation at this time.  Considered admission/ED observation but not warranted at this time.  Patient discharged home with albuterol and prednisone burst as well as strict return precautions and PCP follow-up further management if symptoms persist.Anavi Branscum Lucky Rathke, MD An acute or life threatening problem was considered during this evaluation  A decision regarding hospitalization was made during this visit  Patient does not require admission or further ED Observation at this time  External data reviewed: Notes (OSH or non-ED)Independent interpretation of: XRay Review of Systems Constitutional: Negative for chills and fever. HENT: Positive for congestion, ear pain and sore throat.  Respiratory: Positive for cough, sputum production, shortness of breath and wheezing.  Cardiovascular: Negative for chest pain. Gastrointestinal: Negative for abdominal pain, diarrhea, nausea and vomiting. Genitourinary: Negative for dysuria and frequency. Musculoskeletal: Negative for back pain and neck pain. Skin: Negative for rash. Neurological: Negative for dizziness, weakness and headaches.  Physical ExamED Triage Vitals [08/15/22 0907]BP: 105/75Pulse: (!) 96Pulse from  O2 sat: n/aResp: (!) 28Temp: 98.7 ?F (37.1 ?C)Temp src: OralSpO2: (!) 93 % BP 132/75  - Pulse (!) 91  - Temp 98.8 ?F (37.1 ?C)  - Resp 18  - Wt (!) 166.1 kg  - SpO2 (!) 93%  - BMI 60.94 kg/m? Physical ExamConstitutional:     General: She is not in acute distress.HENT:    Head: Normocephalic and atraumatic.    Nose: Nose normal.    Mouth/Throat:    Mouth: Mucous membranes are moist.    Pharynx: Oropharynx is clear. Eyes:    Extraocular Movements: Extraocular movements intact.    Pupils: Pupils are equal, round, and reactive to light. Cardiovascular:    Rate and Rhythm: Normal rate and regular rhythm.    Heart sounds: Normal heart sounds. Pulmonary:    Effort: Pulmonary effort is normal. No respiratory distress.    Breath sounds: Wheezing (Diffuse) present.    Comments: Actively coughing during examAbdominal:    General: There is no distension.    Palpations: Abdomen is soft.    Tenderness: There is no abdominal tenderness. There is no guarding. Musculoskeletal:       General: Normal range of motion.    Cervical back: Normal range of motion.    Right lower leg: No edema.    Left lower leg: No edema. Skin:   General: Skin is warm and dry. Neurological:    Mental Status: She  is alert and oriented to person, place, and time.  ProceduresAttestation/Critical CareClinical Impressions as of 08/18/22 1129 Exacerbation of asthma, unspecified asthma severity, unspecified whether persistent  ED DispositionDischarge Ollen Barges, MD12/03/23 1129

## 2022-08-19 ENCOUNTER — Telehealth: Payer: Self-pay | Admitting: Pharmacist

## 2022-08-19 MED ORDER — SEMAGLUTIDE (1 MG/DOSE) 4 MG/3ML ~~LOC~~ SOPN: 1.0000 mg | PEN_INJECTOR | SUBCUTANEOUS | 3 refills | Status: AC

## 2022-08-19 NOTE — Telephone Encounter (Signed)
Ozempic prior auth submitted, Key: [employer]

## 2022-08-19 NOTE — Telephone Encounter (Signed)
Approved through 08/19/23.

## 2022-08-21 ENCOUNTER — Ambulatory Visit: Admit: 2022-08-21 | Payer: MEDICAID | Attending: Student in an Organized Health Care Education/Training Program

## 2022-09-02 ENCOUNTER — Ambulatory Visit: Admit: 2022-09-02 | Payer: MEDICAID | Attending: Cardiovascular Disease

## 2022-09-02 ENCOUNTER — Telehealth: Admit: 2022-09-02 | Payer: PRIVATE HEALTH INSURANCE | Attending: Cardiovascular Disease

## 2022-09-02 NOTE — Telephone Encounter
Left vm on pts mobile and father's mobile device.  Patient needs to be rescheduled for an appt with Dr Ambrose Pancoast

## 2022-09-04 ENCOUNTER — Encounter
Admit: 2022-09-04 | Payer: PRIVATE HEALTH INSURANCE | Attending: Student in an Organized Health Care Education/Training Program

## 2022-09-06 MED ORDER — ELIQUIS 5 MG TABLET
5 mg | ORAL_TABLET | 2 refills | Status: AC
Start: 2022-09-06 — End: ?

## 2022-09-16 DIAGNOSIS — Z419 Encounter for procedure for purposes other than remedying health state, unspecified: Secondary | ICD-10-CM | POA: Diagnosis not present

## 2022-09-19 ENCOUNTER — Ambulatory Visit: Admit: 2022-09-19 | Payer: MEDICAID | Attending: Student in an Organized Health Care Education/Training Program

## 2022-09-19 DIAGNOSIS — O903 Peripartum cardiomyopathy: Secondary | ICD-10-CM

## 2022-09-19 MED ORDER — POTASSIUM CHLORIDE ER 10 MEQ TABLET,EXTENDED RELEASE
10 MEQ | ORAL_TABLET | Freq: Two times a day (BID) | ORAL | 4 refills | Status: AC
Start: 2022-09-19 — End: ?

## 2022-09-19 NOTE — Progress Notes
Heart Failure Program    [facility]  Louie Bun, MD, MPH Theo Dills, MD Nada Maclachlan, MD Zella Richer, MD Lajoyce Corners, MD Egbert Garibaldi, MD Ladona Horns, MD Prince Solian, MD Shelle Iron, MD Cheron Schaumann, MD, MTR Vernona Rieger, MD, [facility] Idelle Crouch, MD Follow-up VisitPrimary Care Provider: Pcp (Do Not Change Name) NoSubjective HPI Holly Bond is a 42 y.o. 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  She was previously seen by Dr. Nedra Hai, last in 2014 after which time she moved to [facility].  Since 2014 she is been followed by the advanced heart failure team there.  She recently moved back to [place] after the recent death of her daughter and currently plans to stay here for the foreseeable future.  Because of her recent move she is working to reestablish care with advanced heart failure team as well as Ob/gyn and a PCP for ongoing monitoring.She was last seen in clinic on 10/25 for health care establishment.  During that visit a baseline 2D echocardiogram was ordered as well as an EP referral for continued device monitoring.  Thus far neither have been completed.  Her heart failure medications were refilled at that time.She presents today for 6 week follow-up. She reports taht she her last visit she has had a persistent URI. She recently presented to the ER on 11/30 for evaluation of URI symptoms. She was prescribed an albuterol inhaler and discharged home.Other than a cough productive of clear sputum and some wheezing, she denies any new complaints. Sh reports taht she is now taking her Torsemide daily (as prescribed) as opposed to EOD. She continues to take a booster dose of Metolazone on Wednesdays. Her weight has decreased 6kg since her last visit. Due to ha h/o hypokalemia, she is requesting a potassium supplement.Review of Systems:Review of Systems Constitutional: Negative for chills, diaphoresis, fever and malaise/fatigue. HENT: Negative.  Eyes: Negative.  Respiratory: Negative for cough, sputum production, shortness of breath and wheezing.  Cardiovascular: Negative for chest pain, palpitations, orthopnea, claudication, leg swelling and PND. Gastrointestinal: Negative for abdominal pain, heartburn, nausea and vomiting. Musculoskeletal: Negative.  Skin: Negative.  Neurological: Negative.  Endo/Heme/Allergies: Negative.  Past Medical History:Holly Bond has no past medical history on file.No past surgical history on file.Medications, Allergies, FH, SH: Current medications include albuterol,albuterol sulfate,carvediloL,dapagliflozin propanediol,digoxin,Eliquis,ivabradine,magnesium gluconate,metOLazone,[employer],Entresto,semaglutide (OZEMPIC SUBQ),spironolactone,torsemide,atorvastatin,cephALEXin,Flovent HFA,fluconazole,HYDROcodone-acetaminophen,valACYclovir.She has No Known Allergies.No family history on file.Social History Tobacco Use ? Smoking status: Every Day  Objective Objective BP 132/74 (Site: r a, Position: Sitting, Cuff Size: X-Large)  - Pulse 85  - Ht 5' 4 (1.626 m)  - Wt (!) 163.3 kg  - SpO2 97%  - BMI 61.79 kg/m? Wt: 09/19/22 (!) 163.3 kg 08/15/22 (!) 166.1 kg 07/10/22 (!) 166.1 kg Body mass index is 61.79 kg/m?Marland KitchenRelevant physical exam findings include the following:Vitals reviewed. Constitutional:     Appearance: Healthy appearance. Not in distress. Eyes:    Conjunctiva/sclera: Conjunctivae normal.    Pupils: Pupils are equal, round, and reactive to light. HENT:    Nose: Nose normal. No nasal discharge.  Mouth/Throat:    Dentition: Normal.    Pharynx: Oropharynx is clear. Neck:    Vascular: JVD normal. Pulmonary:    Effort: Pulmonary effort is normal.    Breath sounds: Normal breath sounds. No stridor. No wheezing. No rhonchi. No rales. Chest:    Chest wall: Not tender to palpatation. Cardiovascular:    PMI at left  midclavicular line. Normal rate. Regular rhythm.    Murmurs: There is no murmur.    No gallop. No click. No rub. Pulses:   Intact distal pulses. Edema:   Peripheral edema absent. Abdominal:    General: Bowel sounds are normal. Musculoskeletal: Normal range of motion.    Cervical back: Normal range of motion. Skin:   General: Skin is warm and dry. Neurological:    Mental Status: Alert and oriented to person, place and time. Most recent relevant studies reviewed this visit:CBC RENAL LIVER Lab Results Component Value Date  WBC 11.5 (H) 08/15/2022  WBC 6.8 12/05/2010  HGB 12.5 08/15/2022  HGB 14.4 12/05/2010  HCT 36.30 08/15/2022  HCT 42.2 12/05/2010  PLT 227 08/15/2022  PLT 252 12/05/2010  Lab Results Component Value Date  NA 133 (L) 08/15/2022  NA 133 (L) 11/13/2011  K 3.6 08/15/2022  K cancelled 11/13/2011  CL 95 (L) 08/15/2022  CL 103 11/13/2011  CL 104 12/05/2010  CO2 26 08/15/2022  CO2 23.7 11/13/2011  BUN 8 08/15/2022  BUN 11 11/13/2011  CREATININE 0.70 08/15/2022  CREATININE 0.8 11/13/2011  Total Bilirubin Date Value Ref Range Status 11/13/2011 1.25 (H) <1.20 mg/dL Final Alanine Aminotransferase (ALT) Date Value Ref Range Status 11/13/2011 12 0 - 34 U/L Final Aspartate Aminotransferase (AST) Date Value Ref Range Status 11/13/2011 16 0 - 34 U/L Final Alkaline Phosphatase Date Value Ref Range Status 11/13/2011 61 30 - 130 U/L Final   LIPID PANEL AND HBA1C CARDIAC ENZYMES COAGULATION  No results found for: HGBA1CNo results found for: CHOL, TRIG, HDL, LDL No results found for: TROPONINI  Lab Results Component Value Date  INR 0.92 12/05/2010 Lab Results Component Value Date  PTT 27.4 12/05/2010  EKG (07/10/2022):Sinus rhythm at 91 beats per minute, probable left atrial enlargement, left bundle-branch block.?TTE (11/04/2012):?Severely increased left ventricular size. Mild concentric ?hypertrophy. Severe global systolic dysfunction. Estimated ejection ?fraction by 2D Simpson's is 29 %. ??Normal right ventricular size and systolic function. Estimated right ?ventricular systolic pressure is 45 mmHg.??Severe left atrial dilatation.??At least moderate mitral regurgitation. Mild tricuspid regurgitation.??Mildly plethoric inferior vena cava but normal respiratory variation, ?consistent with mildly increased right atrial pressure.??No significant pericardial effusion.??Compared with images from prior study of 11/07/10, estimated ejection ?fraction has decreased slightly from 35%. Assessment and Recommendations Holly Bond is a 42 y.o. 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  She was previously seen by Dr. 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  - She will continue her current GDMT regimen Coreg 12.5 mg BID, Entresto 24/26 (down from 97/103), Farxiga 10, and Aldactone 25 to complete quad therapy.- Will continueTorsemide 100 mg daily and Metolazone 2.5mg  every Wednesday.- Continue Ivabradine 5mg , digoxin 0.125mg  daily, and Eliquis 5mg  BID.- I will reorder the repeat TTE.- She reports that she no longer has any device company following her ICD readings. Will place referral today for EP/device clinic.?Morbid Obesity: BMI today 61.79. Patient reports recent weight gain during the grieving process and due to a lack of ozempic.- Patient will establish care with a new PCP to facilitate Ozempic refill.?Consideration for Advanced Therapies:- Current barriers to transplant include BMI, current marijuana/alcohol use.- Current barriers to LVAD include BMI.Visit Summary -Continue current GDMT, will give a one time prescription of potassium supplementation. Will order a BMP for 1 week to monitor potassium level. -Will help schedule  with a PCP f/u appointment.-Will also help schedule with an EP/device clinic appointment.-Finally, will reorder TTE for current LV size and function.Orders Placed This Encounter Procedures ? Echo 2D Complete w Doppler and CFI if Ind Image Enhancement 3D and or bubbles   Standing Status:   Future   Standing Expiration Date:   09/20/2023   Order Specific Question:   Reason for Exam:   Answer:   HFrEF, h/o PPCM   Order Specific Question:   Release to patient   Answer:   Immediate   Order Specific Question:   Is this study related to any of the following?   Answer:   None are Applicable ? BASIC METABOLIC PANEL   Standing Status:   Future   Number of Occurrences:   1   Standing Expiration Date:   09/20/2023   Order Specific Question:   Release to patient   Answer:   Immediate  FOLLOW-UP: Return in about 6 weeks (around 10/31/2022).Idelle Crouch, MDYale Heart Failure ProgramCardiovascular Medicine - Heart and Vascular CenterYale School of Medicine - [facility] HospitalEmail: [email or web address] educates and nurtures Recruitment consultant in medicine and science, promoting curiosity and critical inquiry in an inclusive environment enriched by diversity.We advance discovery and innovation fostered by partnerships across the [place], our [employer], and the world.We care for patients with compassion, and commit to improving the health of all people.

## 2022-10-14 ENCOUNTER — Inpatient Hospital Stay: Admit: 2022-10-14 | Discharge: 2022-10-14 | Payer: MEDICAID

## 2022-10-14 DIAGNOSIS — O903 Peripartum cardiomyopathy: Secondary | ICD-10-CM

## 2022-10-24 ENCOUNTER — Encounter: Admit: 2022-10-24 | Payer: MEDICAID | Attending: Student in an Organized Health Care Education/Training Program

## 2022-10-24 ENCOUNTER — Encounter
Admit: 2022-10-24 | Payer: PRIVATE HEALTH INSURANCE | Attending: Student in an Organized Health Care Education/Training Program

## 2022-10-24 DIAGNOSIS — I10 Essential (primary) hypertension: Secondary | ICD-10-CM

## 2022-10-24 DIAGNOSIS — O903 Peripartum cardiomyopathy: Secondary | ICD-10-CM

## 2022-10-24 MED ORDER — ENTRESTO 49 MG-51 MG TABLET
49-51 mg | ORAL_TABLET | Freq: Two times a day (BID) | ORAL | 12 refills | Status: AC
Start: 2022-10-24 — End: ?

## 2022-10-24 NOTE — Progress Notes
Heart Failure Program    [facility]  Holly Bun, MD, MPH Holly Dills, MD Holly Maclachlan, MD Holly Richer, MD Holly Corners, MD Holly Garibaldi, MD Holly Horns, MD Holly Solian, MD Holly Iron, MD Holly Schaumann, MD, MTR Holly Rieger, MD, [facility] Holly Crouch, MD Follow-up VisitPrimary Care Provider: Pcp (Do Not Change Name) NoSubjective HPI Holly Bond is a 42 y.o. 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  She was previously seen by Holly Bond, last in 2014 after which time she moved to [facility]  Since 2014 she is been followed by the advanced heart failure team there.  She recently moved back to [place] after the recent death of her daughter and currently plans to stay here for the foreseeable future.  Because of her recent move she is working to reestablish care with advanced heart failure team as well as Ob/gyn and a PCP for ongoing monitoring.On 10/25 she presented for health care establishment.  During that visit a baseline 2D echocardiogram was ordered as well as an EP referral for continued device monitoring.She followed-up on 09/19/21. During that visit she was again euvolemic. Her TTE was reordered and with the aide of our administrative staff she was scheduled for both EP and PCP clinic appointments.She presents today for 4 week follow-up.  She currently denies any new complaints of chest pain, exertional shortness of breath, orthostatic symptoms, orthopnea/PND, lower extremity edema, or changed appetite.  She reports a both her blood pressure and her weights have been stable at home.  She continues with torsemide with intermittent metolazone for maintenance of euvolemia.  She completed her routine TTE which was significant for an LVEF of 28%, with decreased RV function and moderate MR.Of note, she does mention that the last time she was started on Farxiga she had worsening yeast infections.Also of note, she has inspiratory wheezing on exam.  She notes that she did not take her diuretics this morning in preparation for coming to clinic.  She also notes she has an albuterol inhaler for her mild intermittent asthma which she has not used recently.Review of Systems:Review of Systems Constitutional: Negative for chills, diaphoresis, fever and malaise/fatigue. HENT: Negative.  Eyes: Negative.  Respiratory: Negative for cough, sputum production, shortness of breath and wheezing.  Cardiovascular: Negative for chest pain, palpitations, orthopnea, claudication, leg swelling and PND. Gastrointestinal: Negative for abdominal pain, heartburn, nausea and vomiting. Musculoskeletal: Negative.  Skin: Negative.  Neurological: Negative.  Endo/Heme/Allergies: Negative.  Past Medical History:Holly Bond has no past medical history on file.No past surgical history on file.Medications, Allergies, FH, SH: Current medications include albuterol,albuterol sulfate,atorvastatin,carvediloL,digoxin,Eliquis,ivabradine,metOLazone,[employer]ellaneous [employer],potassium chloride,spironolactone,torsemide,dapagliflozin propanediol,Entresto,cephALEXin,Flovent HFA,fluconazole,HYDROcodone-acetaminophen,magnesium gluconate,semaglutide (OZEMPIC SUBQ),valACYclovir.She is allergic to metformin.No family history on file.Social History Tobacco Use ? Smoking status: Every Day  Objective Objective BP 102/60 (Site: l a, Position: Sitting, Cuff Size: Large)  - Pulse 90  - Ht 5' 5 (1.651 m)  - Wt (!) 167 kg  - SpO2 97%  - BMI 61.27 kg/m? Wt: 10/24/22 (!) 167 kg 09/19/22 (!) 163.3 kg 08/15/22 (!) 166.1 kg Body mass index is 61.27 kg/m?Marland KitchenRelevant physical exam findings include the following:Vitals reviewed. Constitutional:     Appearance: Healthy appearance. Not in distress. Eyes:    Conjunctiva/sclera: Conjunctivae normal.    Pupils: Pupils are equal, round, and reactive to light. HENT:    Nose: Nose normal. No nasal discharge.  Mouth/Throat:    Dentition: Normal.    Pharynx: Oropharynx  is clear. Neck:    Vascular: JVD normal. Pulmonary:    Effort: Pulmonary effort is normal.    Breath sounds: No stridor. Diffuse and bilateral Wheezing present. No rhonchi. No rales. Chest:    Chest wall: Not tender to palpatation. Cardiovascular:    PMI at left midclavicular line. Normal rate. Regular rhythm.    Murmurs: There is a high frequency blowing holosystolic murmur at the apex.    No gallop. No click. No rub. Pulses:   Intact distal pulses. Edema:   Peripheral edema absent. Abdominal:    General: Bowel sounds are normal. Musculoskeletal: Normal range of motion.    Cervical back: Normal range of motion. Skin:   General: Skin is warm and dry. Neurological:    Mental Status: Alert and oriented to person, place and time. Most recent relevant studies reviewed this visit:CBC RENAL LIVER Lab Results Component Value Date  WBC 11.5 (H) 08/15/2022  WBC 6.8 12/05/2010  HGB 12.5 08/15/2022  HGB 14.4 12/05/2010  HCT 36.30 08/15/2022  HCT 42.2 12/05/2010  PLT 227 08/15/2022  PLT 252 12/05/2010  Lab Results Component Value Date  NA 133 (L) 08/15/2022  NA 133 (L) 11/13/2011  K 3.6 08/15/2022  K cancelled 11/13/2011  CL 95 (L) 08/15/2022  CL 103 11/13/2011  CL 104 12/05/2010  CO2 26 08/15/2022  CO2 23.7 11/13/2011  Bond 8 08/15/2022  Bond 11 11/13/2011  CREATININE 0.70 08/15/2022  CREATININE 0.8 11/13/2011  Total Bilirubin Date Value Ref Range Status 11/13/2011 1.25 (H) <1.20 mg/dL Final Alanine Aminotransferase (ALT) Date Value Ref Range Status 11/13/2011 12 0 - 34 U/L Final Aspartate Aminotransferase (AST) Date Value Ref Range Status 11/13/2011 16 0 - 34 U/L Final Alkaline Phosphatase Date Value Ref Range Status 11/13/2011 61 30 - 130 U/L Final   LIPID PANEL AND HBA1C CARDIAC ENZYMES COAGULATION  No results found for: HGBA1CNo results found for: CHOL, TRIG, HDL, LDL No results found for: TROPONINI  Lab Results Component Value Date  INR 0.92 12/05/2010 Lab Results Component Value Date  PTT 27.4 12/05/2010  EKG (07/10/2022):Sinus rhythm at 91 beats per minute, probable left atrial enlargement, left bundle-branch block.?TTE (10/14/2022):~ * Severely increased left ventricular cavity size.  Normal left ventricle wall thickness.  Severe global hypokinesis.  Severely decreased left ventricular systolic function.  LVEF calculated by biplane Simpson's was 28%.  Diastolic function was difficult to determine due to merged E and A wave.~ * Suboptimal visualization of the right ventricle, but it appears to be dilated with decreased function.  ICD/Pacer wire seen in the right ventricle.  Estimated right ventricular systolic pressure is 28 mmHg.~ * Left atrium is severely dilated.  No interatrial shunt by color Doppler.  Right atrium is normal in size.~ * At least moderate mitral regurgitation. Mild tricuspid regurgitation.~ * All visible segments of the aorta are normal in size.~ * IVC diameter < 2.1 cm that collapses > 50% with a sniff suggests normal RAP (0-5 mmHg, mean 3 mmHg).~ * Trivial pericardial effusion.~ * Compared with the prior study, dated 11/04/2012, there are changes noted.  Right ventricle not well visualized but now appears to be dilated with decreased function.TTE (11/04/2012):?Severely increased left ventricular size. Mild concentric ?hypertrophy. Severe global systolic dysfunction. Estimated ejection ?fraction by 2D Simpson's is 29 %. ??Normal right ventricular size and systolic function. Estimated right ?ventricular systolic pressure is 45 mmHg.??Severe left atrial dilatation.??At least moderate mitral regurgitation. Mild tricuspid regurgitation.??Mildly plethoric inferior vena cava but normal respiratory variation, ?consistent with mildly increased right atrial pressure.??No  significant pericardial effusion.??Compared with images from prior study of 11/07/10, estimated ejection ?fraction has decreased slightly from 35%. Assessment and Recommendations Holly Bond is a 42 y.o. 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  - She will increase her Entresto to 49/51 mg BID and continue the rest of her current GDMT regimen Coreg 12.5 mg BID, and Aldactone 25. We will discontinue her Marcelline Deist today due to her reported history of multiple prior yeast infections.- Will continue Torsemide 100 mg daily and Metolazone 2.5mg  every Wednesday.- Continue Ivabradine 5mg , digoxin 0.125mg  daily, and Eliquis 5mg  BID.- She reports that she no longer has any device company following her ICD readings. She will f/u with Dr. Manual Meier on 11/13/22?Morbid Obesity: BMI today 61.79. Patient reports recent weight gain during the grieving process and due to a lack of ozempic.- Patient will establish care with a new PCP to facilitate Ozempic refill. She also mentions she was interested in a gastric balloon and would be interested in a GI referral.Wheezing on exam: Likely secondary to mild intermittent asthma.- patient was instructed to take both her water pills when she got home and to use her inhaler and nebulizer.?Consideration for Advanced Therapies:- Current barriers to transplant include BMI, current marijuana/alcohol use.- Current barriers to LVAD include BMI.Visit Summary -Increase Entresto to 49/51. D/C Farxiga 2/2 yeast infections.-Reordered routine bloodwork (BMP, NT-proBNP, a1c) BMP and NT-proBNP previously ordered.-Consider increasing Coreg if she tolerates the entresto.Orders Placed This Encounter Procedures ? HEMOGLOBIN A1C   Standing Status:   Future   Number of Occurrences:   1   Standing Expiration Date:   12/23/2023   Order Specific Question:   Release to patient   Answer:   Immediate  FOLLOW-UP: Return in about 4 weeks (around 11/21/2022).Holly Bond, MDYale Heart Failure ProgramCardiovascular Medicine - Heart and Vascular CenterYale School of Medicine - [facility] HospitalEmail: [email or web address] educates and nurtures Recruitment consultant in medicine and science, promoting curiosity and critical inquiry in an inclusive environment enriched by diversity.We advance discovery and innovation fostered by partnerships across the [place], our [employer], and the world.We care for patients with compassion, and commit to improving the health of all people.

## 2022-11-13 ENCOUNTER — Encounter: Admit: 2022-11-13 | Payer: MEDICAID | Attending: Cardiovascular Disease

## 2022-11-14 ENCOUNTER — Encounter
Admit: 2022-11-14 | Payer: PRIVATE HEALTH INSURANCE | Attending: Student in an Organized Health Care Education/Training Program

## 2022-11-14 MED ORDER — ELIQUIS 5 MG TABLET
5 mg | ORAL_TABLET | 1 refills | Status: AC
Start: 2022-11-14 — End: ?

## 2022-11-21 ENCOUNTER — Encounter: Admit: 2022-11-21 | Payer: MEDICAID | Attending: Student in an Organized Health Care Education/Training Program

## 2022-12-12 ENCOUNTER — Encounter
Admit: 2022-12-12 | Payer: PRIVATE HEALTH INSURANCE | Attending: Student in an Organized Health Care Education/Training Program

## 2022-12-13 ENCOUNTER — Encounter: Admit: 2022-12-13 | Payer: MEDICAID | Attending: Cardiovascular Disease

## 2022-12-13 ENCOUNTER — Telehealth: Admit: 2022-12-13 | Payer: PRIVATE HEALTH INSURANCE | Attending: Cardiovascular Disease

## 2022-12-13 MED ORDER — ELIQUIS 5 MG TABLET
5 mg | ORAL_TABLET | 1 refills | Status: AC
Start: 2022-12-13 — End: ?

## 2022-12-13 NOTE — Telephone Encounter
Pt no showed for appt, called pt, pt will need to reschedule possible MDT device near end of life

## 2023-01-09 ENCOUNTER — Encounter
Admit: 2023-01-09 | Payer: PRIVATE HEALTH INSURANCE | Attending: Student in an Organized Health Care Education/Training Program

## 2023-01-09 MED ORDER — CORLANOR 5 MG TABLET
5 mg | ORAL_TABLET | 3 refills | Status: AC
Start: 2023-01-09 — End: ?

## 2023-01-09 MED ORDER — POTASSIUM CHLORIDE ER 10 MEQ TABLET,EXTENDED RELEASE
10 MEQ | ORAL_TABLET | Freq: Two times a day (BID) | ORAL | 3 refills | Status: AC
Start: 2023-01-09 — End: ?

## 2023-01-09 MED ORDER — ELIQUIS 5 MG TABLET
5 mg | ORAL_TABLET | 1 refills | Status: AC
Start: 2023-01-09 — End: ?

## 2023-03-10 ENCOUNTER — Telehealth: Admit: 2023-03-10 | Payer: PRIVATE HEALTH INSURANCE

## 2023-03-10 NOTE — Unmapped
Patient called for refill for Spironolactone 25mg  daily. Last filled 10/25.23 90 tabs 3 refillsSp[facility] is not due for [facility]ill. Last dispensed on 4/25/244 for 90 tabs. Next available fill date is 7/9/24Called patient back with this information. Patient states  that she is in the process of moving and possibly misplaced medication. She will look around to see if she can find  medication but has been out of medication for about a weekAdvised patient if unable to find medication to call pharmacy to request exemption for lost medication through insurance. Patient verbalized understanding

## 2023-03-29 ENCOUNTER — Encounter: Admit: 2023-03-29 | Payer: PRIVATE HEALTH INSURANCE

## 2023-03-29 ENCOUNTER — Inpatient Hospital Stay: Admit: 2023-03-29 | Discharge: 2023-03-29 | Payer: MEDICAID | Attending: Emergency Medicine

## 2023-03-29 ENCOUNTER — Emergency Department: Admit: 2023-03-29 | Payer: MEDICAID

## 2023-03-29 DIAGNOSIS — E119 Type 2 diabetes mellitus without complications: Secondary | ICD-10-CM

## 2023-03-29 DIAGNOSIS — E1165 Type 2 diabetes mellitus with hyperglycemia: Secondary | ICD-10-CM

## 2023-03-29 DIAGNOSIS — Z888 Allergy status to other drugs, medicaments and biological substances status: Secondary | ICD-10-CM

## 2023-03-29 DIAGNOSIS — Z4502 Encounter for adjustment and management of automatic implantable cardiac defibrillator: Secondary | ICD-10-CM

## 2023-03-29 DIAGNOSIS — J45909 Unspecified asthma, uncomplicated: Secondary | ICD-10-CM

## 2023-03-29 DIAGNOSIS — I509 Heart failure, unspecified: Secondary | ICD-10-CM

## 2023-03-29 DIAGNOSIS — O903 Peripartum cardiomyopathy: Secondary | ICD-10-CM

## 2023-03-29 LAB — CBC WITH AUTO DIFFERENTIAL
BKR WAM ABSOLUTE IMMATURE GRANULOCYTES.: 0.03 x 1000/ÂµL (ref 0.00–0.30)
BKR WAM ABSOLUTE LYMPHOCYTE COUNT.: 2.33 x 1000/ÂµL (ref 0.60–3.70)
BKR WAM ABSOLUTE NRBC (2 DEC): 0 x 1000/ÂµL — ABNORMAL HIGH (ref 0.00–1.00)
BKR WAM ANC (ABSOLUTE NEUTROPHIL COUNT): 5.61 x 1000/ÂµL (ref 2.00–7.60)
BKR WAM BASOPHIL ABSOLUTE COUNT.: 0.03 x 1000/ÂµL (ref 0.00–1.00)
BKR WAM BASOPHILS: 0.3 % (ref 0.0–1.4)
BKR WAM EOSINOPHIL ABSOLUTE COUNT.: 0.08 x 1000/ÂµL (ref 0.00–1.00)
BKR WAM EOSINOPHILS: 0.9 % (ref 0.0–5.0)
BKR WAM HEMATOCRIT (2 DEC): 47.1 % — ABNORMAL HIGH (ref 35.00–45.00)
BKR WAM HEMOGLOBIN: 16.2 g/dL — ABNORMAL HIGH (ref 11.7–15.5)
BKR WAM IMMATURE GRANULOCYTES: 0.3 % (ref 0.0–1.0)
BKR WAM LYMPHOCYTES: 27.2 % (ref 17.0–50.0)
BKR WAM MCH (PG): 27 pg (ref 27.0–33.0)
BKR WAM MCHC: 34.4 g/dL (ref 31.0–36.0)
BKR WAM MCV: 78.5 fL — ABNORMAL LOW (ref 80.0–100.0)
BKR WAM MONOCYTE ABSOLUTE COUNT.: 0.5 x 1000/ÂµL (ref 0.00–1.00)
BKR WAM MONOCYTES: 5.8 % (ref 4.0–12.0)
BKR WAM MPV: 10.7 fL (ref 8.0–12.0)
BKR WAM NEUTROPHILS: 65.5 % (ref 39.0–72.0)
BKR WAM NUCLEATED RED BLOOD CELLS: 0 % (ref 0.0–1.0)
BKR WAM PLATELETS: 200 x1000/ÂµL (ref 150–420)
BKR WAM RDW-CV: 13.5 % (ref 11.0–15.0)
BKR WAM RED BLOOD CELL COUNT.: 6 M/ÂµL (ref 4.00–6.00)
BKR WAM WHITE BLOOD CELL COUNT: 8.6 x1000/ÂµL — ABNORMAL LOW (ref 4.0–11.0)

## 2023-03-29 LAB — BASIC METABOLIC PANEL
BKR ANION GAP: 10 (ref 7–17)
BKR BLOOD UREA NITROGEN: 16 mg/dL (ref 6–20)
BKR BUN / CREAT RATIO: 20 (ref 8.0–23.0)
BKR CALCIUM: 9.8 mg/dL (ref 8.8–10.2)
BKR CHLORIDE: 91 mmol/L — ABNORMAL LOW (ref 98–107)
BKR CO2: 30 mmol/L (ref 20–30)
BKR CREATININE: 0.8 mg/dL (ref 0.40–1.30)
BKR EGFR, CREATININE (CKD-EPI 2021): >60 mL/min/{1.73_m2} (ref >=60)
BKR GLUCOSE: 514 mg/dL — CR (ref 70–100)
BKR POTASSIUM: 4.6 mmol/L (ref 3.3–5.3)
BKR SODIUM: 131 mmol/L — ABNORMAL LOW (ref 136–144)

## 2023-03-29 LAB — TROPONIN T HIGH SENSITIVITY, 3 HOUR (BH GH LMW YH)
BKR TROPONIN T HS 1 HOUR DELTA FROM 0 HOUR ON 3HR: -7 ng/L
BKR TROPONIN T HS 3 HOUR DELTA FROM 0 HOUR: -9 ng/L
BKR TROPONIN T HS 3 HOUR: 17 ng/L — ABNORMAL HIGH

## 2023-03-29 LAB — TROPONIN T HIGH SENSITIVITY, 1 HOUR WITH REFLEX (BH GH LMW YH)
BKR TROPONIN T HS 1 HOUR DELTA FROM 0 HOUR: -7 ng/L
BKR TROPONIN T HS 1 HOUR: 19 ng/L — ABNORMAL HIGH

## 2023-03-29 LAB — TROPONIN T HIGH SENSITIVITY, 0 HOUR BASELINE WITH REFLEX (BH GH LMW YH): BKR TROPONIN T HS 0 HOUR BASELINE: 26 ng/L — ABNORMAL HIGH

## 2023-03-29 LAB — NT-PROBNPE: BKR B-TYPE NATRIURETIC PEPTIDE, PRO (PROBNP): 425 pg/mL — ABNORMAL HIGH (ref <125.0)

## 2023-03-29 MED ORDER — INSULIN U-100 REGULAR HUMAN 100 UNIT/ML INJECTION SOLUTION
100 | Freq: Once | SUBCUTANEOUS | Status: CP
Start: 2023-03-29 — End: ?
  Administered 2023-03-29: 17:00:00 100 unit/mL via SUBCUTANEOUS

## 2023-03-29 NOTE — Discharge Instructions
Please call your cardiologist 1st thing on MondayPlease set up my chart so that you are able to check in with your doctors through the appWe are referring you to primary care, please take the 1st available appointment.Thank you for allowing [place] the pleasure of taking care of you today.  Please return to the emergency department if you have worsening of any of your current symptoms or feel worse in any way that is concerning to you.

## 2023-03-29 NOTE — ED Notes
2:04 PM abnormal finger stick discussed with provider; provider ok to dc home; pt VSS no complaints at this time IV removed prior to dc; discharge instructions reviewed with pt pt has no questions at this time instructed to return with any new onset s/s

## 2023-03-29 NOTE — ED Notes
9:11 AM Pt arrived from walk in reporting her AICD alarm went off last night and again this AM. Reports AICD went off and delivered shock on July 7th. SOB w/ exertion, improves with rest. Pt has not taken her medications this AM, states her SOB improves after she takes her diuretic. Denies CP, fevers/chills, n/v. A&Ox4, speaking in complete sentences.9:17 AMPt brought to imaging by hospital transport. 11:08 AMReport given to oncoming RN, care deferred.

## 2023-03-29 NOTE — ED Notes
11:23 AM Pt AAOx4 resting comfortably in bed; pt VSS denies c/o SOB CP difficulty breathing dizziness headache. Pt updated on POC

## 2023-03-31 ENCOUNTER — Telehealth
Admit: 2023-03-31 | Payer: PRIVATE HEALTH INSURANCE | Attending: Student in an Organized Health Care Education/Training Program

## 2023-03-31 ENCOUNTER — Telehealth: Payer: Self-pay

## 2023-03-31 NOTE — Telephone Encounter
LOV's pt had arrived and seen with Dr Samuel Bouche 07/10/22, 09/19/22, 2/8/24Please advise nursing how to assist pt with EP visit Prentiss Bells; [facility] Nurse Triage Pool; Hendrum, [place], MD; [facility] Admin Pool14 minutes ago (4:18 PM) Patient has no showed multiple appts with Dr Samuel Bouche and Dr Manual Meier.

## 2023-03-31 NOTE — Telephone Encounter
Called ptReports I actually got shocked on the 7thReports ED encouraged her to get an appt with EPReports she received call from [employer] todayThey told her she could easily get shockedReports device placed in [place] in 2018Encouraged pt to call [employer] for dates of battery lifeRelayed will send message to scheduling to call her for EP appt Pt verbalized understanding

## 2023-03-31 NOTE — Telephone Encounter
Message from ED provider requesting pt to be called to assist with primary care. Patient has an established cardiologist. Call placed , message left requesting a return call. Letter sent to home address.Message was left on EC to return call.Patient returned call. She was given the number to [facility] for follow up care, message was sent to Sela Hua to help expedite this. She had called her cardiologist  when AICD when off. Advised to call then again to see if she shouild have an earlier appointment. She verbalized understanding and was appreciative.
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
## 2023-03-31 NOTE — Telephone Encounter (Signed)
Patient advised to avoid high risk activities such as swimming, etc. Pt voiced understanding and will call back when she has someone to follow her device.

## 2023-03-31 NOTE — Telephone Encounter (Signed)
Routing so you are also aware.

## 2023-04-01 NOTE — Telephone Encounter (Signed)
Patient advised she is not able to get in touch with cardiology at [facility]. I called the office [facility] [phone] and left a VM with direct call back # advising that patient received an ICD shock and needs to be seen by EP MD. Patient has apt. 04/24/23 but unsure with what type of cardiologist. Would like to confirm. Patient requested to call our device clinic if they call her back prior to our office speaking with them. Patient agrees and direct dial given to pt.

## 2023-04-01 NOTE — Telephone Encounter (Signed)
Pt called stating she has a doctors appointment on 7/23. She is waiting on the nurse to give her a call to see if they want her to come in sooner to get her device checked.

## 2023-04-02 NOTE — Telephone Encounter (Signed)
Received call from nurse for Pt's new cardiology office in [place].  Requesting to confirm Pt's name, she states the message she received she could not make out Pt's last name.  She gives Pt's DOB.  Nurse confirms Pt is being followed by practice.  This nurse advised that current office should request remote transmissions for care for Pt.  Nurse states she will discuss with physician.  Also states [employer] rep is on hand.  Await request to transfer remote transmissions.

## 2023-04-03 ENCOUNTER — Encounter: Admit: 2023-04-03 | Payer: PRIVATE HEALTH INSURANCE | Attending: Vascular and Interventional Radiology

## 2023-04-03 ENCOUNTER — Ambulatory Visit: Admit: 2023-04-03 | Payer: MEDICAID | Attending: Vascular and Interventional Radiology

## 2023-04-03 ENCOUNTER — Inpatient Hospital Stay: Admit: 2023-04-03 | Discharge: 2023-04-03 | Payer: MEDICAID

## 2023-04-03 ENCOUNTER — Encounter
Admit: 2023-04-03 | Payer: PRIVATE HEALTH INSURANCE | Attending: Student in an Organized Health Care Education/Training Program

## 2023-04-03 DIAGNOSIS — J45909 Unspecified asthma, uncomplicated: Secondary | ICD-10-CM

## 2023-04-03 DIAGNOSIS — I429 Cardiomyopathy, unspecified: Secondary | ICD-10-CM

## 2023-04-03 DIAGNOSIS — O903 Peripartum cardiomyopathy: Secondary | ICD-10-CM

## 2023-04-03 DIAGNOSIS — I509 Heart failure, unspecified: Secondary | ICD-10-CM

## 2023-04-03 DIAGNOSIS — E119 Type 2 diabetes mellitus without complications: Secondary | ICD-10-CM

## 2023-04-03 MED ORDER — CARVEDILOL IMMEDIATE RELEASE 25 MG TABLET
25 | ORAL_TABLET | Freq: Two times a day (BID) | ORAL | 4 refills | Status: AC
Start: 2023-04-03 — End: ?

## 2023-04-03 NOTE — Progress Notes
[facility]INESE[facility] OF ELECTROPHYSIOLOGY Primary EP: Needs to establishAdvanced CHF: Dr. Cristal Generous Complaint:New PatientHISTORY OF PRESENT ILLNESS:Holly Bond is a 42 y.o. 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 Hx of Atrial flutter per review of Cardiology progress note, initially noted in 2022 and she is maintained on eliquis. She was previously followed by Dr. Nedra Hai, last seen in 2014 after she had moved out of state [facility] Recently moved back to [place], established care with Dr. Samuel Bouche on 07/10/2022. At the time of her visit, she had been out of all her medications except torsemide and magnesium. As she had no plans to move out of state in the near foreseeable future, a referral was placed to establish care with Dr. Manual Meier though she unfortunately missed 3 scheduled new patient visitsShe was seen in the ED on 03/29/2023 after receiving an AICD shock. She is scheduled today for an urgent visit. She states that at the time of receiving the shock, she was at the beach and was exerting herself much more than usual. Prior to the event, she denied any prodromal symptoms of palpitations, chest pain, LH/dizziness, near or true syncope. She denies receiving any shocks in the past.  PCP: No, Pcp (Do Not Change Name)Problem list:Patient Active Problem List  Diagnosis Date Noted  CHF (congestive heart failure) (HC Code) (HC CODE) (HC Code) 05/06/2012  Asthma 05/06/2012  Cardiomyopathy - postpartum 05/06/2012 History:Medical History  She  has a past medical history of Asthma, CHF (congestive heart failure) (HC Code) (HC CODE) (HC Code), Diabetes mellitus (HC Code), and Postpartum cardiomyopathy. Surgical History  She  has a past surgical history that includes Cardiac defibrillator placement. Social History  She  reports that she has been smoking. She does not have any smokeless tobacco history on file. No history on file for alcohol use and drug use. Family History Her family history is not on file. Allergies:She is allergic to metformin.Medications:Current Medications Medication Sig  albuterol (PROVENTIL, VENTOLIN) 2.5 mg /3 mL (0.083 %) nebulizer solution Take 3 mLs by nebulization every 6 (six) hours as needed for shortness of breath.  albuterol sulfate 90 mcg/actuation HFA aerosol inhaler Inhale 1-2 puffs into the lungs every 6 (six) hours as needed for wheezing.  cephALEXin (KEFLEX) 500 MG capsule   digoxin (LANOXIN) 0.125 mg tablet Take 1 tablet (125 mcg total) by mouth daily.  ELIQUIS 5 mg Tab tablet TAKE 1 TABLET (5 MG TOTAL) BY MOUTH 2 (TWO) TIMES DAILY.  metOLazone (ZAROXOLYN) 2.5 mg tablet Take 1 tablet (2.5 mg total) by mouth once a week.  Miscellaneous Medical Supply Use as directed.  potassium chloride (K-TAB,KLOR-CON) 10 MEQ extended release tablet TAKE 1 TABLET (10 MEQ TOTAL) BY MOUTH 2 (TWO) TIMES DAILY.  sacubitriL-valsartan (ENTRESTO) 49-51 mg tablet Take 1 tablet by mouth 2 (two) times daily.  spironolactone (ALDACTONE) 25 mg tablet Take 1 tablet (25 mg total) by mouth daily.  torsemide (DEMADEX) 100 mg tablet Take 1 tablet (100 mg total) by mouth daily.  [DISCONTINUED] carvediloL (COREG) 12.5 mg Immediate Release tablet Take 1 tablet (12.5 mg total) by mouth 2 (two) times daily with breakfast and dinner. ROS:The following ROS documentation is the transcribed Review of System completed by the patient at intake.Review of Systems Constitutional: Negative. Cardiovascular: Negative.  Respiratory: Negative.    Vital Signs: BP 104/72 (Site: l a)  - Pulse 90  - Wt (!) 161.9 kg  -  SpO2 96%  - BMI 59.41 kg/m? Wt Readings from Last 3 Encounters: 04/03/23 (!) 161.9 kg 03/29/23 (!) 161.1 kg 10/24/22 (!) 167 kg Physical ExamGeneral:  NAD, pleasant and alertHEENT:  NC/AT, no thyromegaly, no JVD, no carotid bruits RESP:  Clear bilaterally with no wheezes,rales or rhonchiCARD:  Regular Rate, S1 S2 with no murmur, rubs or gallops appreciated, PMI non-displacedGI:  Soft NT/ND, +BS, no organomegaly, no bruitsGU:  Deferred  Extremities:  No edema, distal pulses 2+ bilaterally, No clubbingNeuro:  Alert and oriented, moving all 4 extremitiesSkin: warm, dry with no rashes Left upper chest implantation site fully healed without evidence of infection, hematoma, erosion.  Psych:  Normal affect and moodTesting:ECHO: 10/14/2022~ * Severely increased left ventricular cavity size.  Normal left ventricle wall thickness.  Severe global hypokinesis.  Severely decreased left ventricular systolic function.  LVEF calculated by biplane Simpson's was 28%.  Diastolic function was difficult to determine due to merged E and A wave.~ * Suboptimal visualization of the right ventricle, but it appears to be dilated with decreased function.  ICD/Pacer wire seen in the right ventricle.  Estimated right ventricular systolic pressure is 28 mmHg.~ * Left atrium is severely dilated.  No interatrial shunt by color Doppler.  Right atrium is normal in size.~ * At least moderate mitral regurgitation. Mild tricuspid regurgitation.~ * All visible segments of the aorta are normal in size.~ * IVC diameter < 2.1 cm that collapses > 50% with a sniff suggests normal RAP (0-5 mmHg, mean 3 mmHg).~ * Trivial pericardial effusion.~ * Compared with the prior study, dated 11/04/2012, there are changes noted.  Right ventricle not well visualized but now appears to be dilated with decreased function.ECG today (personally reviewed): Sinus rhythm, rate 90bpm. LBBB, QRSD 125msLABS:Total Chol No results found for: CHOL AST Aspartate Aminotransferase (AST) (U/L) Date Value 11/13/2011 16  HDL No results found for: HDL ALT Alanine Aminotransferase (ALT) (U/L) Date Value 11/13/2011 12  LDL No results found for: LDL BUN BUN (mg/dL) Date Value 45/40/9811 16 08/15/2022 8 11/13/2011 11 12/05/2010 11  Triglycerides No results found for: TRIG Creatinine Creatinine (mg/dL) Date Value 91/47/8295 0.80 08/15/2022 0.70 11/13/2011 0.8 12/05/2010 0.9  TSH No components found for: THS Potassium Potassium (mmol/L) Date Value 03/29/2023 4.6 08/15/2022 3.6 11/13/2011 cancelled 12/05/2010 3.8  HbA1C No results found for: HGBA1C INR INR (no units) Date Value 12/05/2010 0.92  Hgb Hemoglobin (g/dL) Date Value 62/13/0865 16.2 (H) 08/15/2022 12.5 12/05/2010 14.4  Platelet Platelets Date Value 03/29/2023 200 x1000/?L 08/15/2022 227 x1000/?L 12/05/2010 252 x 1000/uL  Mg No results found for: MG   Device Interrogation:Programming changes made to facilitate interrogation. See media for full interrogation.Manufacturer/Device: MDT Visia Battery status: 3.1 yearsUnderlying rhythm: Sinus rhythmDiagnostic Data:Ventricular paced:  <0.1%%Events:Multiple VT-NS events, mostly lasting 1-2 seconds, one event lasting 37 seconds resulting in 35J shock. Average ventricular rates range 190-279bpmMeasurements:RV Sensing 9.6 mV  Impedence 418 Ohms, Defib 74 OhmsThreshold 0.75 V @ 0.40  msOutputs are set with appropriate safety margins based on capture thresholds. 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 Hx of Atrial flutter per review of Cardiology progress note, initially noted in 2022 and she is maintained on eliquis. She presents today following recently receiving ICD shock. She denies any prior shock events. No prodromal symptoms prior to receiving shock. Review of EGM during the event demonstrates rapid ventricular rates in the 200s. As she  had only a single lead ICD, it is difficult to definitively determine if this rhythm is atrially driven. Irregularity in rhythm would be suggestive of Afib w/ RVR. The qualities of her R waves during the event appears similar to her intrinsic R waves in sinus as well. Will check an MCT to further evaluate this. We went through her medication list today and she assures she has been fully compliant with her medication regimen. At this time, will increase her coreg to 25mg  BID. She is on Corlanor 5mg  BID and digoxin 0.125mg  daily as well. Will check her digoxin level. Her device interrogation otherwise shows battery longevity of 3 years and stable lead measurements and function. I have updated her wavelet in hopes her device may differentiate atrial arrhythmias better. She is scheduled to establish care with Dr. Manual Meier this coming 7/23/2024Follow-up: 7/23/2024In the interim I have encouraged the patient to call with any concerns or worsening symptoms.Raeford Razor, APRNSection of Cardiovascular MedicineElectrophysiology[address] [facility], [place] [phone] [address] [place] [phone] [address], [place], [place] [id number]/18/2024Electronically Signed by Raeford Razor, APRN, July 18, 2024Future Appointments Date Time Provider Department Center 04/08/2023  8:00 AM Joanie Coddington., MD CARD [facility] CAD 04/11/2023  9:50 AM Rebecka Apley, [place] [facility] MED [facility] MEDICAL 04/24/2023  1:40 PM Idelle Crouch, MD CARD [facility] CAD 07/08/2023  3:00 PM Michela Pitcher, MD [facility] MED [facility] MEDICAL

## 2023-04-04 LAB — DIGOXIN LEVEL: DIGOXIN LEVEL: <0.5 mcg/L — ABNORMAL LOW (ref 0.8–2.0)

## 2023-04-04 LAB — TSH W/REFLEX TO FT4     (BH GH LMW Q YH): TSH, 3RD GENERATION W/ REFLEX TO FT4: 2.18 mIU/L
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
## 2023-04-08 ENCOUNTER — Ambulatory Visit: Admit: 2023-04-08 | Payer: MEDICAID | Attending: Cardiovascular Disease

## 2023-04-08 ENCOUNTER — Telehealth: Payer: Self-pay

## 2023-04-08 DIAGNOSIS — I429 Cardiomyopathy, unspecified: Secondary | ICD-10-CM

## 2023-04-08 DIAGNOSIS — G4730 Sleep apnea, unspecified: Secondary | ICD-10-CM

## 2023-04-08 MED ORDER — ELIQUIS 5 MG TABLET
5 | ORAL_TABLET | 4 refills | Status: AC
Start: 2023-04-08 — End: ?

## 2023-04-08 MED ORDER — TORSEMIDE 100 MG TABLET
100 | ORAL_TABLET | Freq: Every day | ORAL | 4 refills | Status: AC
Start: 2023-04-08 — End: ?

## 2023-04-08 MED ORDER — METOLAZONE 2.5 MG TABLET
2.5 | ORAL_TABLET | ORAL | 4 refills | Status: AC
Start: 2023-04-08 — End: ?

## 2023-04-08 MED ORDER — SPIRONOLACTONE 25 MG TABLET
25 | ORAL_TABLET | Freq: Every day | ORAL | 4 refills | Status: AC
Start: 2023-04-08 — End: ?

## 2023-04-08 NOTE — Progress Notes
Reason for Visit: EP f/u for establishing longitudinal care of single-lead ICD, post-partum cardiomyopathy, LVEF 28% by 09/2022 echoHistory of Present Illness I had the pleasure of meeting with Holly Bond today in an EP follow-up visit. We had been scheduled to establish care in 08/2022 but she missed that appointment and one in 11/2022. She has since seen Raeford Razor, APRN (04/03/2023) in EP. This is our first office visit. 	She is a 42 y.o. woman with a past medical history of asthma, chronic otitis media with hearing loss, s/p left salpingo-oophorectomy in 2010, and post-partum cardiomyopathy.             Although morbidly obese for most of her life, she was generally healthy until June 2010, when she presented two months following her second full-term pregnancy with congestive heart failure symptoms.  She was hospitalized in [place] with marked volume overload and exertional symptoms.  She was diuresed from 300 to 250 pounds.  She started on low-dose heart failure therapy and discharged for followup.             She then saw one cardiologist between then and her 10/2010 visit with Dr. Rod Can. She explained she was not told the details of her underlying heart condition other than that she had a cardiomyopathy.  She did not have a cardiac catheterization.  She was not told specifically about the degree of left ventricular dysfunction.  No mention was made of heart transplantation. She further explained that in high school she was generally very active. She had occasional asthma attacks, but did not require chronic asthma therapy.  In her adult years, she remained healthy except for a weight gain and carried a healthy infant to full pregnancy in 2006.  However, after her full term pregnancy in April of 2010, which was uncomplicated, she presented with progressive congestive heart failure symptoms leading to her heart failure diagnosis. 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 She functioned at a [place] Heart Association class II level.  She could climb one flight of steps without difficulty but cannot manage two flights. She reported no chronic edema or nocturnal dyspnea.  She claimed good adherence to prescribed therapy. She was followed regularly by a physician associated with the [facility].              Since her 2010 presentation she had been treated nonaggressively with a very low-dose cardiac regimen. Since the onset of her congestive heart failure symptoms, her clinical course had been significantly complicated by coexisting asthma, which worsened with her underlying heart failure.  Thus, she had frequent (near monthly) decompensations that did not require hospitalization but did lead to more intensification of her asthma therapy in an attempt to control her shortness of breath.  Other family history is positive for premature onset of heart  disease in her mother.  Thus, there may be a familial component to her cardiomyopathy.  Also, the clinical setting of heart failure onset following pregnancy is entirely consistent with a postpartum cardiomyopathy.After their visit Dr. Nedra Hai made the following suggestions: that she establish care with a pulmonologist, stop smoking, look into bariatric surgery, and her ACE inhibitor was discontinued because it may have been contributing to her persistent coughing. She began Losartan 50 mg daily. An ECHO prior to the visit on 10/18/2010 revealed LVEF=35% with moderate MR and normal right-sided function.  They met again in 09/2012. She reported essentially the same problems-- marked exertional dyspnea and nocturnal shortness of breath consistent with either asthma or CHF. She had no obvious volume overload symptoms. She had stopped taking ACE/ARB entirely and was on only metoprolol 25 mg BID and Lasix 20 mg daily. Mild wheezes were heard on exam. A follow-up echo was ordered to clarify the status of her post-partum CM.              Their 10/2012 visit was in the context of a recent evaluation at [facility] for painful leg swelling and a course of antibiotics for cellulitis, increased stress at home because she was divorcing her husband, new LE edema, and worsening of her chronic orthopnea. She now had severe LV systolic dysfunction, clinical and exam findings of significant volume overload, and progressive LV remodeling with severe LV dilation, with LVEDD now increased to 7.6 cm from 6.8 cm in 2012.   She was on minimal CHF therapy and has a h/o of intolerance to ACE-i and ARB therapy (possible cough and headaches).  Her life-long morbid obesity partially masks the degree of her underlying volume overload. After their visit furosemide 40 mg BID was prescribed with a plan to introduce an alternative beta-blocker after her volume status improved. An AICD was discussed.              There was a plan for very close follow-up to observe her response to therapy (2 weeks) but it looks like she next saw [facility] cardiology in 06/2022.             She saw Dr. Idelle Crouch on 07/10/2022 and explained she had moved to [facility] in 2014. Since 2014 she had been followed by the advanced heart failure team there.  She recently moved back to [place] after the recent death of her daughter and currently plans to stay here for the foreseeable future.  Because of her recent move she was working to reestablish care with advanced heart failure team as well as Ob/gyn and a PCP for ongoing monitoring. She reported she recently ran out of her heart failure medications and had been out for about a week and a half. On review of her chart, she was last seen by her advanced heart failure team on 02/19/2022.  During this visit her past history is reviewed including a recent hospital admission requiring the patient to be discharged home on Milrinone support.  Milrinone was subsequently weaned off in the following weeks.  She has previously been discussed regarding advanced therapies by her advanced heart failure team.  She was considered not to be a transplant candidate due to her BMI.  She was also not considered to be an LVAD candidate for the same reason as well.  On discussion with the patient she also reported intermittent marijuana use as well as alcohol use.             After their visit the majority of her GDMT was refilled with minor alternations due to her being off of them for a week and a half: Coreg 12.5 mg BID, Entresto 24/26 (down from 97/103), Farxiga 10 mg, and Aldactone 25 to complete quad therapy. Other refills included torsemide 100 mg daily (she reported taking it only daily instead of BID), metolazone 2.5 mg every Wednesday, Ivabradine 5 mg, digoxin 0.125 mg daily, and Eliquis 5 mg BID. Routine bloodwork was ordered to reestablish a new baseline. A repeat TTE was ordered. She was going  to establish care with a new internist to facilitate an Ozempic refill. While in [place] an ICD was placed, so she was referred to EP to establish longitudinal care. She had an ED visit on 08/15/2022 for shortness of breath, sore throat, chest congestion, and right ear pain x 1 week. The clinical impression was exacerbation of asthma. She was discharged home with albuterol and prednisone burst as well as strict return precautions and PCP follow-up further management if symptoms persist.              After a 09/2022 follow-up with Dr. Samuel Bouche she was given a one time prescription of potassium supplementation (with a subsequent BMP planned for 1 week later to monitor potassium level) and the TTE was reordered plus she was given help scheduling internist and EP appointments.             Her echo (10/14/2022) demonstrated severely increased LV cavity size, normal LV wall thickness, severe global hypokinesis, severely decreased LV systolic function with an LVEF of 28%, diastolic function difficult to determine due to merged E and A wave, suboptimal visualization of the right ventricle but it appeared to be dilated with decreased function, an estimated RVSP of 28 mmHg, a severely dilated left atrium, right atrium normal in size, at least moderate MR, mild TR, and trivial pericardial effusion. Compared with the prior study, dated 11/04/2012, there are changes noted. Right ventricle not well visualized but now appears to be dilated with decreased function.              After their 10/2022 follow-up her Sherryll Burger was increased to 49/51 and her Marcelline Deist was discontinued 2/2 yeast infections. Routine bloodwork that was previously ordered was reordered and it was noted that an increase in Coreg will be considered if she tolerates the Entresto.  	She was seen in the ED on 03/29/2023 after receiving an AICD shock, and she saw Raeford Razor in EP follow-up on 7/18. She explained that at the time of receiving the shock, [place] the [place] and was exerting herself much more than usual. Prior to the event, she denied any prodromal symptoms of palpitations, chest pain, LH/dizziness, near or true syncope. She denied receiving any shocks in the past. Review of EGM during the event demonstrates rapid ventricular rates in the 200s. As she had only a single lead ICD, it is difficult to definitively determine if this rhythm is atrially driven. Irregularity in rhythm would be suggestive of Afib w/ RVR. The qualities of her R waves during the event appears similar to her intrinsic R waves in sinus as well. An MCT was ordered to further evaluate. She reported being fully compliant with her medication regimen. Her coreg was increased to 25 mg BID. She is on Corlanor 5mg  BID and digoxin 0.125mg  daily as well and a digoxin level was ordered. Her wavelet was updated in hopes her device may differentiate atrial arrhythmias better. She is here today with her daughter. She recalls atrial fibrillation being found in [place] in 2018, though she began Eliquis more recently (around 2022). She received Milrinone through a port up for about 2 years, until late 2022. After it was stopped she felt unchanged. Her 03/2023 shock above was the first. Prior to the shock she was fatigued and short of breath but she did not feel palpitations. She had not yet taken her cardiac medications that day when the shock happened. Assuming that was an episode of Afib, she believes the prior most recent  episode was in March. This was about 20-30 seconds of a rapid heart rate that began when sitting up in bed. Weight loss attempts have been unsuccessful though she is trying to limit portions. She was previously on [facility] but it sounds like the prescription lapsed in the move from [place] to [place]. She has been diagnosed with sleep apnea and used CPAP in the past (a couple years ago) but is not using it currently. Her ICD home monitor is not set up in [place] yet.The patient has had no angina, no signs of overt volume overload denying PND, orthopnea, or pedal edema. The patient has not noticed any rapid or irregular heart beats. Past Medical History The imported medical history below has been reviewed:Past Medical History: Diagnosis Date  Asthma   CHF (congestive heart failure) (HC Code) (HC CODE) (HC Code)   Diabetes mellitus (HC Code)   Postpartum cardiomyopathy  Past Surgical History Past Surgical History: Procedure Laterality Date  CARDIAC DEFIBRILLATOR PLACEMENT    Medtronic Allergies Allergies Allergen Reactions  Metformin Constipation Medications: Outpatient Medications Marked as Taking for the 04/08/23 encounter (Office Visit) with Joanie Coddington., MD Medication Sig Dispense Refill  albuterol (PROVENTIL, VENTOLIN) 2.5 mg /3 mL (0.083 %) nebulizer solution Take 3 mLs by nebulization every 6 (six) hours as needed for shortness of breath. 75 mL 0  albuterol sulfate 90 mcg/actuation HFA aerosol inhaler Inhale 1-2 puffs into the lungs every 6 (six) hours as needed for wheezing. 8 g 0  carvediloL (COREG) 25 mg Immediate Release tablet Take 1 tablet (25 mg total) by mouth 2 (two) times daily with breakfast and dinner. 180 tablet 3  CORLANOR 5 mg tablet TAKE 1 TABLET (5 MG TOTAL) BY MOUTH 2 (TWO) TIMES DAILY. 90 tablet 2  digoxin (LANOXIN) 0.125 mg tablet Take 1 tablet (125 mcg total) by mouth daily. 30 tablet 11  ELIQUIS 5 mg Tab tablet TAKE 1 TABLET (5 MG TOTAL) BY MOUTH 2 (TWO) TIMES DAILY. 180 tablet 0  metOLazone (ZAROXOLYN) 2.5 mg tablet Take 1 tablet (2.5 mg total) by mouth once a week. 12 tablet 3  potassium chloride (K-TAB,KLOR-CON) 10 MEQ extended release tablet TAKE 1 TABLET (10 MEQ TOTAL) BY MOUTH 2 (TWO) TIMES DAILY. 60 tablet 2  sacubitriL-valsartan (ENTRESTO) 49-51 mg tablet Take 1 tablet by mouth 2 (two) times daily. 60 tablet 11  spironolactone (ALDACTONE) 25 mg tablet Take 1 tablet (25 mg total) by mouth daily. 90 tablet 3  torsemide (DEMADEX) 100 mg tablet Take 1 tablet (100 mg total) by mouth daily. 90 tablet 3 Social History Social History Tobacco Use  Smoking status: Every Day  Family History No family status information on file.  Review of Systems General: No Weight Loss, No Weight Gain, No Fever, No Chills, No Visual Changes, No Memory ChangesCardiac: No LE Edema, No Palpitations, No Syncope, No PND, No OrthopneaGI: No Nausea, No Vomiting, No Abdominal Pain, No Heartburn; No melena, No BRBPR, No DiarrheaPulmonary: No Cough, No WheezingUrological: No Hematuria, No DysuriaNeurological: No Weakness, No TremorDermatologic No Rash, No ItchingRheumatologic: No Joint Pain, No Joint Swelling.(All other systems were reviewed and negative).PHYSICAL EXAM  VS:  Pain Score: Zero (04/08/2023  8:08 AM), Height: 5' 5 (1.651 m) (04/08/2023  8:08 AM), Weight: (!) 160.3 kg (04/08/2023  8:08 AM), Weight (lbs): 353.4 (04/08/2023  8:08 AM), BMI (Calculated): 58.8 (04/08/2023  8:08 AM) Vitals:  04/08/23 0808 BP: (P) 90/70 Site: (P) Left Arm Position: (P) Sitting Pulse: (!) (P) 99 SpO2: (P) 96% Weight: (!) 160.3  kg Height: 5' 5 (1.651 m)  Wt Readings from Last 3 Encounters: 04/08/23 (!) 160.3 kg 04/03/23 (!) 161.9 kg 03/29/23 (!) 161.1 kg General: No Acute Distress, Well Nourished and Well DevelopedHEENT: EOMI, MMMNeck: Supple, No JVD, No Carotid Bruit Pulmonary: CTAB; Normal effort; No wheezes/rhonchi/ralesCardiac: normal S1, normal S2, RRR, no M/R/G. ICD site is well-healed.Abdomen: Soft, non-tender, non-distended, no guarding or rebound; NABSNeuro: CN grossly intact; Moves all 4 extremitiesPsychiatric: Appropriate, Alert and Oriented x3Extremities: No Clubbing, Cyanosis, or Edema, 2+ pulses, bilaterally, throughout.Diagnostics Results for orders placed or performed in visit on 04/08/23 EKG Result Value Ref Range  Heart Rate 88 bpm  QRS Interval 134 ms  QT Interval 389 ms  QTC Interval 472 ms  P Axis 57 deg  QRS Axis 42 deg  T Wave Axis 173 deg  P-R Interval 200 msec  SEVERITY Abnormal ECG severity Results for orders placed or performed during the hospital encounter of 10/14/22 Echo 2D Complete w Doppler and CFI if Ind Image Enhancement 3D and or bubbles Result Value Ref Range  Reported Biplane EF% 28 %  Narrative  ~ * Severely increased left ventricular cavity size.  Normal left ventricle wall thickness.  Severe global hypokinesis.  Severely decreased left ventricular systolic function.  LVEF calculated by biplane Simpson's was 28%.  Diastolic function was difficult to determine due to merged E and A wave.* Suboptimal visualization of the right ventricle, but it appears to be dilated with decreased function.  ICD/Pacer wire seen in the right ventricle.  Estimated right ventricular systolic pressure is 28 mmHg.* Left atrium is severely dilated.  No interatrial shunt by color Doppler.  Right atrium is normal in size.* At least moderate mitral regurgitation. Mild tricuspid regurgitation.* All visible segments of the aorta are normal in size.* IVC diameter < 2.1 cm that collapses > 50% with a sniff suggests normal RAP (0-5 mmHg, mean 3 mmHg).* Trivial pericardial effusion.* Compared with the prior study, dated 11/04/2012, there are changes noted.  Right ventricle not well visualized but now appears to be dilated with decreased function. Results for orders placed or performed during the hospital encounter of 11/04/12 Echo 2D Complete Bubble Study w Doppler and CFI if Indicated Contrast and or 3D  Narrative   Transthoracic Echo Report Patient Name:      Holly Bond, Holly Bond                  MRN:   [id number] DOB:   Aug 06, 1981         Age:    59      Gender:   F Ht:    165     cm    Wt:    159     kg BSA:   2.81    m^2   BP:          /         HR:   96 Accession #:      [id number]           Exam Date:     11/04/2012 09:13 Sonographer:                    Maxine Glenn RDCS Indications:                    Cardiomyopathy Procedure:                      COMPLETE 2D, DOPPLER AND COLOR Agitated Saline:  No Contrast:                       Definity 3D Imaging of:                  Left Ventricle Technical Quality:              Poor Technical Limitation:           Body habitus IMPRESSION Severely increased left ventricular size. Mild concentric  hypertrophy. Severe global systolic dysfunction. Estimated ejection  fraction by 2D Simpson's is 29 %.  Normal right ventricular size and systolic function. Estimated right  ventricular systolic pressure is 45 mmHg. Severe left atrial dilatation. At least moderate mitral regurgitation. Mild tricuspid regurgitation. Mildly plethoric inferior vena cava but normal respiratory variation,  consistent with mildly increased right atrial pressure. No significant pericardial effusion. Compared with images from prior study of 11/07/10, estimated ejection  fraction has decreased slightly from 35%. Data show that many patients with ejection fraction </=35% show  survival benefit from implantable cardioverter-defibrillators  (ICD's). This decision should be tailored for each individual  patient* (This automated reminder is part of a [facility] HVC quality initiative) *AHA/ACC guidelines for device implantation- Epstein, 2010 FINDINGS LEFT VENTRICLE: Intracavitary contrast was administered for better visualization of  endocardial borders. Severely increased left ventricular size. Mild  concentric hypertrophy. Severe global systolic dysfunction. Estimated  ejection fraction by 2D Simpson?s is 29 %. Abnormal tissue Doppler  suggestive of abnormal diastolic function. RIGHT VENTRICLE: Normal right ventricular size. Normal systolic function. Estimated  right ventricular systolic pressure is 45 mmHg. ATRIA: Severe left atrial dilatation. Normal right atrial size. No  interatrial shunt by color Doppler. MITRAL VALVE: Normal appearing mitral valve leaflets. At least moderate mitral  regurgitation. AORTIC VALVE: Trileaflet aortic valve. No aortic stenosis. No significant aortic  regurgitation. TRICUSPID VALVE: Structurally normal tricuspid valve leaflets. Mild tricuspid  regurgitation. PULMONARY VALVE: Structurally normal pulmonic valve leaflets. No pulmonary  regurgitation. AORTA: Normal visualized portions of the aorta. INFERIOR VENA CAVA: Mildly plethoric inferior vena cava but normal respiratory variation,  consistent with mildly increased right atrial pressure. PERICARDIUM: No significant pericardial effusion. MEASUREMENTS 2D ECHO LV Diastolic Diameter PLAX        7.6 cm (4.1 - 5.7) LV Systolic Diameter PLAX         6.3 cm                (2.0 - 4.0  IVS Diastolic Thickness           1.2 cm                (0.6 - 1.0 ) LVPW Diastolic Thickness          1.2 cm                 (0.6 - 1.0) LA Systolic Diameter LX           3.2 cm                (3.0 - 4.0) LA Volume MOD BP Index            53.0 ml/m?            (16 - 28 cm?/m?) Aortic Root Diameter              2.0 cm                (2.7-4.1) DOPPLER LVOT Peak Velocity  1.0 m/s                AV Peak Velocity                  1.2 m/s               (1.0-1.7 m/s) TR Peak Velocity                  2.8 m/s                TR Peak Gradient                  31.8 mmHg             (< 25 mmHg) Mitral E Point Velocity           1.5 m/s               (0.6-1.3) MV Deceleration Time              165.0 ms               LV E' Lateral Velocity            5.3 cm/s              (>=10cm/s) LV E' Septal Velocity             5.3 cm/s              (>=8cm/s) Mitral E to LV E' Lateral Ratio   27.9                  (=<8) Mitral E to LV E' Septal Ratio    27.9                  (=<8) Interpreting Cardiologist: Rhea Belton MD Edited by:  Rhea Belton MD (Electronically Signed) Final Date:      04 November 2012 11:24 No results found for this or any previous visit.Labs ChemistryLab Results Component Value Date  NA 131 (L) 03/29/2023  K 4.6 03/29/2023  CL 91 (L) 03/29/2023  CO2 30 03/29/2023  BUN 16 03/29/2023  CREATININE 0.80 03/29/2023  CALCIUM 9.8 03/29/2023 Lab Results Component Value Date  ALT 12 11/13/2011  AST 16 11/13/2011  ALKPHOS 61 11/13/2011  BILITOT 1.25 (H) 11/13/2011 Lab Results Component Value Date  BNPPRO 425.0 (H) 03/29/2023 CBCLab Results Component Value Date  WBC 8.6 03/29/2023  RBC 6.00 03/29/2023  HGB 16.2 (H) 03/29/2023  HCT 47.10 (H) 03/29/2023  MCV 78.5 (L) 03/29/2023 MCH 27.0 03/29/2023  MCHC 34.4 03/29/2023  RDW 13.6 12/05/2010  PLT 200 03/29/2023  MPV 10.7 03/29/2023  LipidsNo results found for: CHOL, HDL, LDL, TRIG, NONHDL, LIPOAA1CNo results found for: HGBA1CThyroidLab Results Component Value Date  TSH 2.18 04/03/2023 EKGSinus rhythm at 88 beats per minute.  She had borderline first-degree AV block and IVCD with a QRS breadth of 130 milliseconds and a corrected QT interval 440Device InterrogationShe has a single lead Medtronic visi via a AF ICD.  It is programmed to backup paced VVI 40.  This is a single VF zone set up for rates in excess of 200 beats per minute.  Battery should last for 3.1 years.  Lead parameters are excellent.  Her activity levels are fairly poor and transthoracic impedance trend has been flat.  I reviewed the defibrillator shock from several weeks ago.  This was likely related to AFib with RVR.  There was poor wavelet match  so the device interpreted the event as VT and delivered an ICD shock.  We have since updated the template and today change the gain.  Since the ICD shock, there have been 3 brief bursts of tachycardia lasting less than 10 seconds.  These are likely supraventricular as well.Impression/Plan I had a long discussion with Mrs. Benally and her daughter.  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  A recent EF was 28% and she has class 3 symptoms.  She was followed at [facility] heart failure group until about 2013.  She then moved to [place] for the next decade where she was followed by their advanced heart failure group.  She was treated with continuous outpatient Milrinone for almost 2 years.  She had what sounds to be a primary prevention ICD placed.  She was not thought to be a candidate for LVAD or heart transplantation due to her weight.  She has a history of PAF for which she is on Eliquis.  She believes her 1st episode was 2018 although did not begin Eliquis until about 1-2 years ago.  She has never required an antiarrhythmic drug or a DC cardioversion.  She recently moved to [place] after the tragic death of her 61 year old daughter.  She plans on being here permanently.  She is trying to reestablish doctors in the [facility] area.  She had a recent ICD shock which was likely related to AFib with marked RVR in the setting of missing her medications.  We have again changed the wavelet algorithm by increasing the gain which hopefully will provide better discrimination between SVT and VT in the future.  The critical importance of taking all her medications was reviewed with the patient.  Obvious remediable triggers to her atrial arrhythmia includes her weight with a BMI of almost 60 and untreated sleep apnea.  She seems motivated to use a CPAP machine which she had trialed in [place].  We will have her meet with the sleep Medicine doctors.  She is trying to reestablish care with a primary care doctor to get back on her [facility] and she would be interested in limited bariatric surgery if we thought she was stable from a cardiovascular standpoint for it.  We will send her a new home [facility] monitor as her prior 1 is in [place].  We are also trying to get the [facility] team to allow [place] to ?take control over her home monitoring as she will be living here full-time now.  Additionally, we talked about rhythm control strategies for her AFib including antiarrhythmic drugs and a potential PVI.  I think this will be a discussion which we will need to have several additional times in the future.  Her QRS is mildly broadened to but only to about 130 milliseconds and with an IVCD as opposed to a true left bundle.  For the moment, I do not think she would benefit by as coronary sinus lead.  We will continue to track that carefully.  If she  developed a true left bundle even if her QRS width was not 150 milliseconds, I still think she would be a candidate for an upgrade.  Careful follow-up will be arranged.  Scribed for Egan Sahlin, Kathrin Penner., MD by Doylene Bode, medical scribe July 23, 2024The documentation recorded by the scribe accurately reflects the services I personally performed and the decisions made by me. I reviewed and confirmed all material entered and/or pre-charted by the  scribe.Roe Coombs, MD

## 2023-04-08 NOTE — Telephone Encounter (Signed)
The pt being followed by [facility]. I have canceled all upcoming remotes and she has been released in [facility].

## 2023-04-09 ENCOUNTER — Telehealth: Admit: 2023-04-09 | Payer: PRIVATE HEALTH INSURANCE | Attending: Cardiovascular Disease

## 2023-04-09 NOTE — Telephone Encounter
Spoke with patient. Enrolled patient in [facility] and placed an order for a bedside monitor. Patient is aware to expect in 7-10 days . Advised her to contact the clinic when the monitor arrives for assistance with setup and sending a transmission

## 2023-04-14 ENCOUNTER — Telehealth: Admit: 2023-04-14 | Payer: PRIVATE HEALTH INSURANCE | Attending: Vascular and Interventional Radiology

## 2023-04-14 NOTE — Telephone Encounter
Preventice alertSR w/ run VT (10 beats) HR 205 on /28/24 @ 3:37 am central timeSpoke w/ptShe states she was sleeping and denies any symptomsPt confirms that she taking Coreg 25 mg BID, Corlanor 5 mg BID, and Digoxin 125 mcg QDStrip to be uploaded to pts chart

## 2023-04-24 ENCOUNTER — Encounter: Admit: 2023-04-24 | Payer: MEDICAID | Attending: Student in an Organized Health Care Education/Training Program

## 2023-04-24 DIAGNOSIS — Z4502 Encounter for adjustment and management of automatic implantable cardiac defibrillator: Secondary | ICD-10-CM

## 2023-04-24 DIAGNOSIS — O903 Peripartum cardiomyopathy: Secondary | ICD-10-CM

## 2023-04-24 DIAGNOSIS — E119 Type 2 diabetes mellitus without complications: Secondary | ICD-10-CM

## 2023-04-24 NOTE — Progress Notes
Heart Failure Program    [facility]  Louie Bun, MD, MPH Theo Dills, MD Nada Maclachlan, MD Zella Richer, MD Lajoyce Corners, MD Egbert Garibaldi, MD Ladona Horns, MD Prince Solian, MD Shelle Iron, MD Cheron Schaumann, MD, MTR Vernona Rieger, MD, [facility] Idelle Crouch, MD Follow-up VisitPrimary Care Provider: Pcp (Do Not Change Name) NoSubjective HPI Holly Bond is a 42 y.o. 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  She was previously seen by Dr. Nedra Hai, last in 2014 after which time she moved to [facility].  Since 2014 she is been followed by the advanced heart failure team there.  She recently moved back to [place] after the recent death of her daughter and currently plans to stay here for the foreseeable future.  Because of her recent move she is working to reestablish care with advanced heart failure team as well as Ob/gyn and a PCP for ongoing monitoring.On 10/25 she presented for health care establishment.  During that visit a baseline 2D echocardiogram was ordered as well as an EP referral for continued device monitoring.She followed-up on 09/19/21. During that visit she was again euvolemic. Her TTE was reordered and with the aide of our administrative staff she was scheduled for both EP and PCP clinic appointments.At 3/7 visit - Increased Entresto 49/51 to optimize GDMT, also d/ced Farxiga 2/2 yeast infection.She presents today for routine follow-up. Today she presents with concerns following a recent defibrillator shock. The event occurred last month and was associated with an alarm from the device, which was initially unrecognized by the patient. The shock was reportedly triggered by a rapid ventricular rate in the 200s during a period of increased physical exertion [place] the beach. The patient denies any symptoms prior to the event. On further evaluation, it appeared that her tachycardic rhythm at the time was rapid atrial fibrillation which was inappropriately defibrillated.Following the shock, the patient was seen by her electrophysiologist and was given an event monitor to wear. However, the monitor caused a significant skin rash and was returned. The patient denies any significant events or symptoms during the monitoring period.The patient also reports an episode of waking up with a rapid heart rate, which resolved quickly upon waking and moving around. She denies any associated shortness of breath or other symptoms. The patient denies any known history of snoring.The patient has been feeling generally well, with no reported shortness of breath. She has been sleeping more than usual, with a shift in her sleep pattern to being awake at night and sleeping during the day. The patient expresses a desire to return to exercising but has concerns about triggering another defibrillator shock.The patient also reports a recent weight loss of 7 pounds over two weeks since starting a new medication, Mounjaro. She reports constipation as a side effect but denies any other adverse effects. She also reports soreness in her legs, likely neuropathy, which she attributes to her diabetes.Review of Systems:Review of Systems Constitutional:  Negative for chills, diaphoresis, fever and malaise/fatigue. HENT: Negative.   Eyes: Negative.  Respiratory:  Negative for cough, sputum production, shortness of breath and wheezing.  Cardiovascular:  Negative for chest pain, palpitations, orthopnea, claudication, leg swelling and PND. Gastrointestinal:  Negative for abdominal pain, heartburn, nausea and vomiting. Musculoskeletal: Negative.  Skin: Negative.  Neurological: Negative.  Endo/Heme/Allergies: Negative.  Past Medical History:Holly Bond has a past medical history of Asthma, CHF (congestive heart failure) (HC Code) (HC CODE) (HC  Code), Diabetes mellitus (HC Code), and Postpartum cardiomyopathy.Past Surgical History: Procedure Laterality Date  CARDIAC DEFIBRILLATOR PLACEMENT    [employer]  SALPINGOOPHORECTOMY   Medications, Allergies, FH, SH: Current medications include albuterol,albuterol sulfate,BLOOD GLUCOSE METER,blood sugar diagnostic,carvediloL,cORLAnor,digoxin,Eliquis,lancets,metOLazone,potassium chloride,Entresto,spironolactone,Mounjaro,torsemide,atorvastatin,fluconazole.She is allergic to metformin.No family history on file.Social History Tobacco Use  Smoking status: Some Days   Types: Cigarettes Vaping Use  Vaping Use: Never used Substance Use Topics  Alcohol use: Yes  Drug use: Never  Objective Objective BP 118/84 (Site: l a, Position: Sitting, Cuff Size: Large)  - Pulse (!) 99  - Ht 5' 5 (1.651 m)  - Wt (!) 156.5 kg  - SpO2 97%  - BMI 57.41 kg/m? Wt: 04/24/23 (!) 156.5 kg 04/11/23 (!) 160.8 kg 04/08/23 (!) 160.3 kg Body mass index is 57.41 kg/m?Marland KitchenRelevant physical exam findings include the following:Vitals reviewed. Constitutional:     Appearance: Healthy appearance. Not in distress. Eyes:    Conjunctiva/sclera: Conjunctivae normal.    Pupils: Pupils are equal, round, and reactive to light. HENT:    Nose: Nose normal. No nasal discharge.  Mouth/Throat:    Dentition: Normal.    Pharynx: Oropharynx is clear. Neck:    Vascular: JVD normal. Pulmonary:    Effort: Pulmonary effort is normal.    Breath sounds: Normal breath sounds. No stridor. No wheezing. No rhonchi. No rales. Chest:    Chest wall: Not tender to palpatation. Cardiovascular:    PMI at left midclavicular line. Normal rate. Regular rhythm.    Murmurs: There is no murmur.    No gallop.  No click. No rub. Pulses:   Intact distal pulses. Edema:   Peripheral edema absent. Abdominal:    General: Bowel sounds are normal. Musculoskeletal: Normal range of motion. Cervical back: Normal range of motion. Skin:   General: Skin is warm and dry. Neurological:    Mental Status: Alert and oriented to person, place and time. Most recent relevant studies reviewed this visit:CBC RENAL LIVER Lab Results Component Value Date  WBC 8.6 03/29/2023  WBC 11.5 (H) 08/15/2022  HGB 16.2 (H) 03/29/2023  HGB 12.5 08/15/2022  HCT 47.10 (H) 03/29/2023  HCT 36.30 08/15/2022  PLT 200 03/29/2023  PLT 227 08/15/2022  Lab Results Component Value Date  NA 131 (L) 03/29/2023  NA 133 (L) 08/15/2022  K 4.6 03/29/2023  K 3.6 08/15/2022  CL 91 (L) 03/29/2023  CL 95 (L) 08/15/2022  CL 103 11/13/2011  CO2 30 03/29/2023  CO2 26 08/15/2022  BUN 16 03/29/2023  BUN 8 08/15/2022  CREATININE 0.80 03/29/2023  CREATININE 0.70 08/15/2022  Total Bilirubin Date Value Ref Range Status 11/13/2011 1.25 (H) <1.20 mg/dL Final Alanine Aminotransferase (ALT) Date Value Ref Range Status 11/13/2011 12 0 - 34 U/L Final Aspartate Aminotransferase (AST) Date Value Ref Range Status 11/13/2011 16 0 - 34 U/L Final Alkaline Phosphatase Date Value Ref Range Status 11/13/2011 61 30 - 130 U/L Final   LIPID PANEL AND HBA1C CARDIAC ENZYMES COAGULATION  Lab Results Component Value Date  HGBA1C 13.0 (H) 04/11/2023 No results found for: CHOL, TRIG, HDL, LDL No results found for: TROPONINI  Lab Results Component Value Date  INR 0.92 12/05/2010 Lab Results Component Value Date  PTT 27.4 12/05/2010  EKG (07/10/2022):Sinus rhythm at 91 beats per minute, probable left atrial enlargement, left bundle-branch block. TTE (10/14/2022):~ * Severely increased left ventricular cavity size.  Normal left ventricle wall thickness.  Severe global hypokinesis.  Severely decreased left ventricular systolic function.  LVEF calculated by biplane Simpson's was 28%.  Diastolic function was  difficult to determine due to merged E and A wave.~ * Suboptimal visualization of the right ventricle, but it appears to be dilated with decreased function.  ICD/Pacer wire seen in the right ventricle.  Estimated right ventricular systolic pressure is 28 mmHg.~ * Left atrium is severely dilated.  No interatrial shunt by color Doppler.  Right atrium is normal in size.~ * At least moderate mitral regurgitation. Mild tricuspid regurgitation.~ * All visible segments of the aorta are normal in size.~ * IVC diameter < 2.1 cm that collapses > 50% with a sniff suggests normal RAP (0-5 mmHg, mean 3 mmHg).~ * Trivial pericardial effusion.~ * Compared with the prior study, dated 11/04/2012, there are changes noted.  Right ventricle not well visualized but now appears to be dilated with decreased function.TTE (11/04/2012): Severely increased left ventricular size. Mild concentric  hypertrophy. Severe global systolic dysfunction. Estimated ejection  fraction by 2D Simpson's is 29 %.   Normal right ventricular size and systolic function. Estimated right  ventricular systolic pressure is 45 mmHg.  Severe left atrial dilatation.  At least moderate mitral regurgitation. Mild tricuspid regurgitation.  Mildly plethoric inferior vena cava but normal respiratory variation,  consistent with mildly increased right atrial pressure.  No significant pericardial effusion.  Compared with images from prior study of 11/07/10, estimated ejection  fraction has decreased slightly from 35%. Assessment and Recommendations Holly Bond is a 42 y.o. 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  She was previously seen by Dr. Nedra Hai, last in 2014 and has been living in [place] since then. Chronic Systolic Heart Failure: Currently appears warm and euvolemic on exam, evidencing NYHA class III status- Etiology: PPCM- GDMT:             - B-blocker: Carvedilol 25 mg BID             - ACEI/ARB/ARNI: Entresto 49/51 mg BID             - SGLT2i: None, given her uncontrolled diabetes she would benefit from and SGLT2i             - MRA: Spironolactone 25 mg daily- Nitrates/Hydralazine: None	- Other:  Ivabradine 5 mg BID, Digoxin 0.125 mg daily             - Diuretic: Torsemide (Demadex) 100 mg daily             - GDMT considerations: Will need to start her on an SGLT2i at her next visit. 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 Current barriers to LVAD include BMI. Atrial Fibrillation with AICD: Recent inappropriate shock due to rapid atrial fibrillation misinterpreted as ventricular tachycardia. Device settings adjusted by EP to prevent future inappropriate shocks. No sustained VT noted on event monitor.- Continue current cardiac medications.- Encourage regular exercise, aiming for heart rate of 110-120 during activity. 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 No med change today. Consider SGLT2i at next visit.No orders of the defined types were placed in this encounter. FOLLOW-UP: Return in about 3 months (around 07/25/2023).Idelle Crouch, MDYale Heart Failure ProgramCardiovascular Medicine - Heart and Vascular [employer] [facility] [place]  [facility] HospitalEmail: [email or web address] educates and nurtures Recruitment consultant in medicine and science, promoting curiosity and critical inquiry in an inclusive environment enriched by diversity.We advance discovery and innovation fostered by partnerships across the [employer], our [employer], and the world.We care for patients with compassion, and commit to improving the health of all people.

## 2023-07-15 ENCOUNTER — Telehealth: Admit: 2023-07-15 | Payer: PRIVATE HEALTH INSURANCE | Attending: Cardiovascular Disease

## 2023-07-15 NOTE — Telephone Encounter
 Spoke with pt and provided assistance with sending a transmission.

## 2023-07-16 ENCOUNTER — Encounter
Admit: 2023-07-16 | Payer: PRIVATE HEALTH INSURANCE | Attending: Student in an Organized Health Care Education/Training Program

## 2023-07-21 ENCOUNTER — Encounter: Admit: 2023-07-21 | Payer: PRIVATE HEALTH INSURANCE | Attending: Cardiovascular Disease

## 2023-07-21 ENCOUNTER — Encounter: Admit: 2023-07-21 | Payer: PRIVATE HEALTH INSURANCE

## 2023-07-21 DIAGNOSIS — Z4502 Encounter for adjustment and management of automatic implantable cardiac defibrillator: Secondary | ICD-10-CM

## 2023-07-22 MED ORDER — IVABRADINE 5 MG TABLET
5 | ORAL_TABLET | 4 refills | Status: AC
Start: 2023-07-22 — End: ?

## 2023-07-22 MED ORDER — DIGOXIN 125 MCG (0.125 MG) TABLET
0.125 | ORAL_TABLET | Freq: Every day | ORAL | 4 refills | Status: AC
Start: 2023-07-22 — End: ?

## 2023-07-22 MED ORDER — POTASSIUM CHLORIDE ER 10 MEQ TABLET,EXTENDED RELEASE
10 | ORAL_TABLET | Freq: Two times a day (BID) | ORAL | 4 refills | Status: AC
Start: 2023-07-22 — End: ?

## 2023-08-06 ENCOUNTER — Encounter
Admit: 2023-08-06 | Payer: PRIVATE HEALTH INSURANCE | Attending: Student in an Organized Health Care Education/Training Program

## 2023-08-07 ENCOUNTER — Encounter: Admit: 2023-08-07 | Payer: MEDICAID | Attending: Student in an Organized Health Care Education/Training Program

## 2023-08-27 ENCOUNTER — Encounter: Admit: 2023-08-27 | Payer: MEDICAID | Attending: Student in an Organized Health Care Education/Training Program

## 2023-10-07 ENCOUNTER — Encounter
Admit: 2023-10-07 | Payer: PRIVATE HEALTH INSURANCE | Attending: Student in an Organized Health Care Education/Training Program

## 2023-10-07 DIAGNOSIS — I509 Heart failure, unspecified: Principal | ICD-10-CM

## 2023-10-08 MED ORDER — ENTRESTO 49 MG-51 MG TABLET
49-51 | ORAL_TABLET | Freq: Two times a day (BID) | ORAL | 3 refills | Status: AC
Start: 2023-10-08 — End: ?

## 2023-10-20 ENCOUNTER — Encounter: Admit: 2023-10-20 | Payer: PRIVATE HEALTH INSURANCE

## 2023-10-20 ENCOUNTER — Encounter: Admit: 2023-10-20 | Payer: PRIVATE HEALTH INSURANCE | Attending: Cardiovascular Disease

## 2023-10-20 DIAGNOSIS — Z4502 Encounter for adjustment and management of automatic implantable cardiac defibrillator: Secondary | ICD-10-CM

## 2023-10-28 ENCOUNTER — Encounter: Admit: 2023-10-28 | Payer: PRIVATE HEALTH INSURANCE

## 2023-10-28 ENCOUNTER — Encounter: Admit: 2023-10-28 | Payer: MEDICAID | Attending: Cardiovascular Disease

## 2023-11-04 ENCOUNTER — Ambulatory Visit: Admit: 2023-11-04 | Payer: MEDICAID | Attending: Student in an Organized Health Care Education/Training Program

## 2023-11-04 ENCOUNTER — Encounter: Admit: 2023-11-04 | Payer: PRIVATE HEALTH INSURANCE | Attending: Pulmonary Disease

## 2023-12-30 ENCOUNTER — Encounter
Admit: 2023-12-30 | Payer: PRIVATE HEALTH INSURANCE | Attending: Student in an Organized Health Care Education/Training Program

## 2023-12-30 DIAGNOSIS — I509 Heart failure, unspecified: Secondary | ICD-10-CM

## 2024-01-05 ENCOUNTER — Encounter
Admit: 2024-01-05 | Payer: PRIVATE HEALTH INSURANCE | Attending: Student in an Organized Health Care Education/Training Program

## 2024-01-05 DIAGNOSIS — I509 Heart failure, unspecified: Secondary | ICD-10-CM

## 2024-01-06 ENCOUNTER — Encounter: Admit: 2024-01-06 | Payer: PRIVATE HEALTH INSURANCE | Attending: Vascular and Interventional Radiology

## 2024-01-06 MED ORDER — ENTRESTO 49 MG-51 MG TABLET
49-51 | ORAL_TABLET | Freq: Two times a day (BID) | ORAL | 2 refills | Status: AC
Start: 2024-01-06 — End: ?

## 2024-01-07 ENCOUNTER — Telehealth: Admit: 2024-01-07 | Payer: PRIVATE HEALTH INSURANCE | Attending: Cardiovascular Disease

## 2024-01-07 MED ORDER — CARVEDILOL IMMEDIATE RELEASE 25 MG TABLET
25 | ORAL_TABLET | Freq: Two times a day (BID) | ORAL | 1 refills | Status: AC
Start: 2024-01-07 — End: ?

## 2024-01-07 NOTE — Telephone Encounter
 Refill request approved for short term duration. Missed recent visits with both EP and Cardiology with no future visits scheduled. Will reach out to arrange visits for both

## 2024-01-07 NOTE — Telephone Encounter
 Attempted to contact pt to schedule f/u w Dr.Blitzer and needed as well w Dr. Lalla Pill. No answer; left vm requesting to call office

## 2024-01-20 ENCOUNTER — Encounter: Admit: 2024-01-20 | Payer: PRIVATE HEALTH INSURANCE | Attending: Cardiovascular Disease

## 2024-01-20 ENCOUNTER — Encounter: Admit: 2024-01-20 | Payer: PRIVATE HEALTH INSURANCE

## 2024-01-20 DIAGNOSIS — Z95810 Presence of automatic (implantable) cardiac defibrillator: Secondary | ICD-10-CM

## 2024-02-24 ENCOUNTER — Encounter
Admit: 2024-02-24 | Payer: PRIVATE HEALTH INSURANCE | Attending: Student in an Organized Health Care Education/Training Program

## 2024-02-24 DIAGNOSIS — I509 Heart failure, unspecified: Secondary | ICD-10-CM

## 2024-02-24 MED ORDER — ENTRESTO 49 MG-51 MG TABLET
49-51 | ORAL_TABLET | Freq: Two times a day (BID) | ORAL | 1 refills | 30.00000 days | Status: AC
Start: 2024-02-24 — End: ?

## 2024-02-25 ENCOUNTER — Telehealth
Admit: 2024-02-25 | Payer: PRIVATE HEALTH INSURANCE | Attending: Student in an Organized Health Care Education/Training Program

## 2024-02-25 NOTE — Telephone Encounter
 Reached out to her to confirm that her phone number is correct because the admin. Office tried to call her to schedule her a follow up apt. She stated that she has it turned off while she is at work.

## 2024-03-12 ENCOUNTER — Encounter: Admit: 2024-03-12 | Payer: PRIVATE HEALTH INSURANCE | Attending: Vascular and Interventional Radiology

## 2024-03-12 DIAGNOSIS — I509 Heart failure, unspecified: Principal | ICD-10-CM

## 2024-03-12 DIAGNOSIS — I429 Cardiomyopathy, unspecified: Secondary | ICD-10-CM

## 2024-03-23 ENCOUNTER — Encounter
Admit: 2024-03-23 | Payer: PRIVATE HEALTH INSURANCE | Attending: Student in an Organized Health Care Education/Training Program

## 2024-03-23 DIAGNOSIS — I509 Heart failure, unspecified: Principal | ICD-10-CM

## 2024-04-20 ENCOUNTER — Encounter
Admit: 2024-04-20 | Payer: PRIVATE HEALTH INSURANCE | Attending: Student in an Organized Health Care Education/Training Program

## 2024-04-20 DIAGNOSIS — I509 Heart failure, unspecified: Principal | ICD-10-CM

## 2024-04-21 ENCOUNTER — Encounter: Admit: 2024-04-21 | Payer: PRIVATE HEALTH INSURANCE

## 2024-04-21 ENCOUNTER — Encounter: Admit: 2024-04-21 | Payer: PRIVATE HEALTH INSURANCE | Attending: Cardiovascular Disease

## 2024-04-21 DIAGNOSIS — Z95810 Presence of automatic (implantable) cardiac defibrillator: Principal | ICD-10-CM

## 2024-04-28 ENCOUNTER — Encounter
Admit: 2024-04-28 | Payer: PRIVATE HEALTH INSURANCE | Attending: Student in an Organized Health Care Education/Training Program

## 2024-04-28 DIAGNOSIS — I509 Heart failure, unspecified: Principal | ICD-10-CM

## 2024-04-28 MED ORDER — ENTRESTO 49 MG-51 MG TABLET
49-51 | ORAL_TABLET | Freq: Two times a day (BID) | ORAL | 1 refills | 30.00000 days | Status: AC
Start: 2024-04-28 — End: ?

## 2024-04-28 NOTE — Telephone Encounter
 Received refill for Entresto  49-51mg  Patient overdue for appt. Unable to reach patient, phone number listed does not accept calls Called Holly Bond (patient contact ) LMOM to call office back regarding refill request and need to update patient contact information Phone number provided

## 2024-05-07 ENCOUNTER — Encounter
Admit: 2024-05-07 | Payer: PRIVATE HEALTH INSURANCE | Attending: Student in an Organized Health Care Education/Training Program

## 2024-05-07 DIAGNOSIS — I509 Heart failure, unspecified: Principal | ICD-10-CM

## 2024-05-26 ENCOUNTER — Encounter
Admit: 2024-05-26 | Payer: PRIVATE HEALTH INSURANCE | Attending: Student in an Organized Health Care Education/Training Program

## 2024-05-26 DIAGNOSIS — I4891 Unspecified atrial fibrillation: Secondary | ICD-10-CM

## 2024-05-26 DIAGNOSIS — I509 Heart failure, unspecified: Principal | ICD-10-CM

## 2024-05-26 MED ORDER — ENTRESTO 49 MG-51 MG TABLET
49-51 | ORAL_TABLET | Freq: Two times a day (BID) | ORAL | 3 refills | 87.50000 days | Status: AC
Start: 2024-05-26 — End: ?

## 2024-05-26 MED ORDER — ELIQUIS 5 MG TABLET
5 | ORAL_TABLET | Freq: Two times a day (BID) | ORAL | 3 refills | 30.00000 days | Status: AC
Start: 2024-05-26 — End: ?

## 2024-05-27 ENCOUNTER — Encounter
Admit: 2024-05-27 | Payer: PRIVATE HEALTH INSURANCE | Attending: Student in an Organized Health Care Education/Training Program

## 2024-05-27 DIAGNOSIS — I509 Heart failure, unspecified: Principal | ICD-10-CM

## 2024-05-27 MED ORDER — METOLAZONE 2.5 MG TABLET
2.5 | ORAL_TABLET | ORAL | 3 refills | Status: AC
Start: 2024-05-27 — End: ?

## 2024-06-15 ENCOUNTER — Encounter: Admit: 2024-06-15 | Payer: PRIVATE HEALTH INSURANCE

## 2024-06-15 ENCOUNTER — Encounter: Admit: 2024-06-15 | Payer: PRIVATE HEALTH INSURANCE | Attending: Cardiovascular Disease

## 2024-06-15 DIAGNOSIS — Z95810 Presence of automatic (implantable) cardiac defibrillator: Principal | ICD-10-CM

## 2024-06-16 ENCOUNTER — Encounter
Admit: 2024-06-16 | Payer: PRIVATE HEALTH INSURANCE | Attending: Student in an Organized Health Care Education/Training Program

## 2024-06-16 VITALS — BP 90/64 | HR 83 | Ht 64.0 in | Wt 325.8 lb

## 2024-06-16 DIAGNOSIS — J45909 Unspecified asthma, uncomplicated: Secondary | ICD-10-CM

## 2024-06-16 DIAGNOSIS — I509 Heart failure, unspecified: Secondary | ICD-10-CM

## 2024-06-16 DIAGNOSIS — E782 Mixed hyperlipidemia: Secondary | ICD-10-CM

## 2024-06-16 DIAGNOSIS — E119 Type 2 diabetes mellitus without complications: Secondary | ICD-10-CM

## 2024-06-16 DIAGNOSIS — I429 Cardiomyopathy, unspecified: Principal | ICD-10-CM

## 2024-06-16 DIAGNOSIS — O903 Peripartum cardiomyopathy: Secondary | ICD-10-CM
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
## 2024-06-22 ENCOUNTER — Encounter
Admit: 2024-06-22 | Payer: PRIVATE HEALTH INSURANCE | Attending: Student in an Organized Health Care Education/Training Program

## 2024-06-22 DIAGNOSIS — I509 Heart failure, unspecified: Principal | ICD-10-CM

## 2024-06-26 MED ORDER — DIGOXIN 125 MCG (0.125 MG) TABLET
0.125 | ORAL_TABLET | Freq: Every day | ORAL | 3 refills | 30.00000 days | Status: AC
Start: 2024-06-26 — End: ?

## 2024-06-26 MED ORDER — IVABRADINE 5 MG TABLET
5 | ORAL_TABLET | Freq: Two times a day (BID) | ORAL | 3 refills | 30.00000 days | Status: AC
Start: 2024-06-26 — End: ?

## 2024-06-26 MED ORDER — POTASSIUM CHLORIDE ER 10 MEQ TABLET,EXTENDED RELEASE
10 | ORAL_TABLET | Freq: Two times a day (BID) | ORAL | 3 refills | 31.00000 days | Status: AC
Start: 2024-06-26 — End: ?

## 2024-06-26 MED ORDER — TORSEMIDE 100 MG TABLET
100 | ORAL_TABLET | Freq: Every day | ORAL | 3 refills | 45.00000 days | Status: AC
Start: 2024-06-26 — End: ?

## 2024-06-26 MED ORDER — SPIRONOLACTONE 25 MG TABLET
25 | ORAL_TABLET | Freq: Every day | ORAL | 3 refills | 90.00000 days | Status: AC
Start: 2024-06-26 — End: ?

## 2024-07-19 ENCOUNTER — Encounter: Admit: 2024-07-19 | Payer: PRIVATE HEALTH INSURANCE | Attending: Cardiovascular Disease

## 2024-07-19 ENCOUNTER — Encounter: Admit: 2024-07-19 | Payer: PRIVATE HEALTH INSURANCE

## 2024-07-19 DIAGNOSIS — Z95810 Presence of automatic (implantable) cardiac defibrillator: Principal | ICD-10-CM

## 2024-07-22 ENCOUNTER — Encounter
Admit: 2024-07-22 | Payer: PRIVATE HEALTH INSURANCE | Attending: Student in an Organized Health Care Education/Training Program

## 2024-07-22 DIAGNOSIS — I509 Heart failure, unspecified: Principal | ICD-10-CM

## 2024-07-22 MED ORDER — ENTRESTO 49 MG-51 MG TABLET
49-51 | ORAL_TABLET | Freq: Two times a day (BID) | ORAL | 4 refills | 30.00000 days | Status: AC
Start: 2024-07-22 — End: ?

## 2024-08-25 ENCOUNTER — Encounter: Admit: 2024-08-25 | Payer: PRIVATE HEALTH INSURANCE | Attending: Vascular and Interventional Radiology

## 2024-08-25 VITALS — BP 96/60 | HR 78 | Ht 64.0 in | Wt 326.8 lb

## 2024-08-25 DIAGNOSIS — E119 Type 2 diabetes mellitus without complications: Secondary | ICD-10-CM

## 2024-08-25 DIAGNOSIS — I4891 Unspecified atrial fibrillation: Secondary | ICD-10-CM

## 2024-08-25 DIAGNOSIS — J45909 Unspecified asthma, uncomplicated: Secondary | ICD-10-CM

## 2024-08-25 DIAGNOSIS — I509 Heart failure, unspecified: Principal | ICD-10-CM

## 2024-08-25 DIAGNOSIS — O903 Peripartum cardiomyopathy: Secondary | ICD-10-CM

## 2024-08-25 MED ORDER — CARVEDILOL IMMEDIATE RELEASE 25 MG TABLET
25 | Freq: Two times a day (BID) | ORAL | 4.00 refills | 30.00000 days | Status: AC
Start: 2024-08-25 — End: ?

## 2024-08-25 NOTE — Progress Notes [1]
 [facility] [place] [facility]ARDIOVASC[facility] MEDICINESECTION OF ELECTROPHYSIOLOGY Primary EP: Dr. BlitzerCardiology: Dr. Jules Complaint:Follow-upHISTORY OF PRESENT ILLNESS:Holly Bond is a 43 y.o. 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 Hx of Atrial flutter per review of Cardiology progress note, initially noted in 2022 and she is maintained on eliquis .  She was previously followed by Dr. Jama, last seen in 2014 after she had moved out of state ([place] ). Recently moved back to [place] , established care with Dr. Corinthia on 07/10/2022. At the time of her visit, she had been out of all her medications except torsemide  and magnesium . As she had no plans to move out of state in the near foreseeable future, a referral was placed to establish care with Dr. Nadeen though she unfortunately missed 3 scheduled new patient visits She was seen in the ED on 03/29/2023 after receiving an AICD shock and was scheduled for an urgent visit with me on 04/02/2024 to establish EP care. Review of EGM during the event demonstrates rapid ventricular rates in the 200s. As she had only a single lead ICD, it is difficult to definitively determine if this rhythm is atrially driven. Irregularity in rhythm would be suggestive of Afib w/ RVR. The qualities of her R waves during the event appears similar to her intrinsic R waves in sinus as well. We increased her coreg  dose to 25mg  BID.She established care with Dr. Nadeen on 04/08/2023. ICD shock was felt to be 2/2 Afib w/ RVR as well ISO her being off her medications. Since, she has missed subsequent visitsPresents today for follow-up, reports to have been doing well overall and denies any issues. She is accompanied by her daughter today. She reports occasional symptoms of palpitations though these are transient. 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 Nadeen) and EPS/p AICD in [place]  in 2018 per ptMedications: Coreg  increased to 25mg  bid, Also on Corlanor  5mg  bid and digoxin  0.125mg  daily.Echo 2D Complete w Doppler and CFI if Ind Image Enhancement 3D and or bubbles (10/14/2022 1:59 PM) LVEF 28% History:Medical History  She  has a past medical history of Asthma, CHF (congestive heart failure) (HC Code)  (HC CODE), Diabetes mellitus (HC CODE), and Postpartum cardiomyopathy. Surgical History  She  has a past surgical history that includes Cardiac defibrillator placement and Salpingoophorectomy. Social History  She  reports that she has been smoking cigarettes. She has never been exposed to tobacco smoke. She has never used smokeless tobacco. She reports current alcohol use. She reports that she does not use drugs. Family History Her family history is not on file. Allergies:She is allergic to metformin.Medications:Current Medications Medication Sig  BLOOD GLUCOSE METER device Use to check blood sugar 2 times per day. Diabetes mellitus type 2, insulin -requiring [E11.9]. Please dispense glucometer covered by patient's insurance.  digoxin  (LANOXIN ) 0.125 mg tablet TAKE 1 TABLET (125 MCG TOTAL) BY MOUTH DAILY.  docusate sodium (COLACE) 100 mg capsule TAKE 1 CAPSULE (  100 MG TOTAL) BY MOUTH DAILY. IF NEEDED FOR CONSTIPATION. BE SURE TO DRINK LOTS OF WATER.  ELIQUIS  5 mg Tab tablet TAKE 1 TABLET (5 MG TOTAL) BY MOUTH 2 (TWO) TIMES DAILY.  ENTRESTO  49-51 mg tablet TAKE 1 TABLET BY MOUTH 2 (TWO) TIMES DAILY.  fluconazole (DIFLUCAN) 150 mg tablet Take 1 tablet (150 mg total) by mouth Once as needed, may repeat 1 extra dose (if symptoms persist after 7 days) for up to 2 doses.  FREESTYLE LITE test strips USE TO CHECK BLOOD SUGAR UP TO 2 TIMES PER DAY.  ivabradine  (CORLANOR ) 5 mg tablet TAKE 1 TABLET (5 MG TOTAL) BY MOUTH 2 (TWO) TIMES DAILY.  lancets (FREESTYLE) 28 gauge Misc lancets USE TO CHECK BLOOD SUGAR UP TO 2 TIMES PER DAY.  metOLazone  (ZAROXOLYN ) 2.5 mg tablet TAKE 1 TABLET (2.5 MG TOTAL) BY MOUTH ONCE A WEEK.  potassium chloride  (K-TAB ,KLOR-CON ) 10 MEQ extended release tablet TAKE 1 TABLET (10 MEQ TOTAL) BY MOUTH 2 (TWO) TIMES DAILY. (Patient taking differently: Take 6 tablets (60 mEq total) by mouth every other day.)  spironolactone  (ALDACTONE ) 25 mg tablet TAKE 1 TABLET (25 MG TOTAL) BY MOUTH DAILY.  tirzepatide (MOUNJARO) 12.5 mg/0.5 mL Pen Injector Inject 1 Pen (12.5 mg total) under the skin every 7 days. E11.65  torsemide  (DEMADEX ) 100 mg tablet TAKE 1 TABLET (100 MG TOTAL) BY MOUTH DAILY.  [DISCONTINUED] carvediloL  (COREG ) 25 mg Immediate Release tablet Take 1 tablet (25 mg total) by mouth 2 (two) times daily with breakfast and dinner. ROS:The following ROS documentation is the transcribed Review of System completed by the patient at intake.Review of Systems Constitutional: Negative. Cardiovascular: Negative.  Respiratory: Negative.    Vital Signs: BP 96/60 (Site: l a, Position: Sitting, Cuff Size: Large)  - Pulse 78  - Ht 5' 4 (1.626 m)  - Wt (!) 148.2 kg  - SpO2 95%  - BMI 56.10 kg/m? Wt Readings from Last 3 Encounters: 08/25/24 (!) 148.2 kg 07/22/24 (!) 146.1 kg 06/16/24 (!) 147.8 kg Physical ExamGeneral:  NAD, pleasant and alertHEENT:  NC/AT, no thyromegaly, no JVD, no carotid bruits RESP:  Clear bilaterally with no wheezes,rales or rhonchiCARD:  Regular Rate, S1 S2 with no murmur, rubs or gallops appreciated, PMI non-displacedGI:  Soft NT/ND, +BS, no organomegaly, no bruitsGU:  Deferred  Extremities:  No edema, distal pulses 2+ bilaterally, No clubbingNeuro:  Alert and oriented, moving all 4 extremitiesSkin: warm, dry with no rashes left upper chest implantation site fully healed without evidence of infection, hematoma, erosion.  Psych:  Normal affect and moodTesting:ECHO: 10/14/2022~ * Severely increased left ventricular cavity size.  Normal left ventricle wall thickness.  Severe global hypokinesis.  Severely decreased left ventricular systolic function.  LVEF calculated by biplane Simpson's was 28%.  Diastolic function was difficult to determine due to merged E and A wave.~ * Suboptimal visualization of the right ventricle, but it appears to be dilated with decreased function.  ICD/Pacer wire seen in the right ventricle.  Estimated right ventricular systolic pressure is 28 mmHg.~ * Left atrium is severely dilated.  No interatrial shunt by color Doppler.  Right atrium is normal in size.~ * At least moderate mitral regurgitation. Mild tricuspid regurgitation.~ * All visible segments of the aorta are normal in size.~ * IVC diameter < 2.1 cm that collapses > 50% with a sniff suggests normal RAP (0-5 mmHg, mean 3 mmHg).~ * Trivial pericardial effusion.~ * Compared with the prior study, dated 11/04/2012, there are changes noted.  Right ventricle not  well visualized but now appears to be dilated with decreased function.ECG today (personally reviewed): Sinus rhythm, QRSd 156msecLABS:Total Chol Cholesterol, Total (mg/dL) Date Value 87/82/7975 141  AST Aspartate Aminotransferase (AST) (U/L) Date Value 11/13/2011 16  HDL HDL (mg/dL) Date Value 87/82/7975 30 (L)  ALT Alanine Aminotransferase (ALT) (U/L) Date Value 11/13/2011 12  LDL LDL Cholesterol (mg/dL (calc)) Date Value 87/82/7975 90  BUN BUN (mg/dL) Date Value 92/86/7975 16 08/15/2022 8 11/13/2011 11 12/05/2010 11  Triglycerides Triglycerides (mg/dL) Date Value 87/82/7975 118  Creatinine Creatinine (mg/dL) Date Value 92/86/7975 0.80 08/15/2022 0.70 11/13/2011 0.8 12/05/2010 0.9  TSH No components found for: THS Potassium Potassium (mmol/L) Date Value 03/29/2023 4.6 08/15/2022 3.6 11/13/2011 cancelled 12/05/2010 3.8  HbA1C Hemoglobin A1c (% of total Hgb) Date Value 04/11/2023 13.0 (H) Hemoglobin A1C, POC (%) Date Value 07/22/2024 8.1 12/15/2023 9.6 09/02/2023 10.4  INR INR (no units) Date Value 12/05/2010 0.92  Hgb Hemoglobin (g/dL) Date Value 92/86/7975 16.2 (H) 08/15/2022 12.5 12/05/2010 14.4  Platelet Platelets Date Value 03/29/2023 200 x1000/?L 08/15/2022 227 x1000/?L 12/05/2010 252 x 1000/uL  Mg No results found for: MG   Device Interrogation:Programming changes made to facilitate interrogation. See media for full interrogation.Manufacturer/Device: MDT Visia Battery status: 2.6 yearsUnderlying rhythm: sinus rhythmDiagnostic Data:Ventricular paced:  <0.1%Events:Multiple NSVT events with EGM appear to be atrial runs (Afib and/or possible SVT/AT). Varying duration from <1 to 22 seconds long. 2 episodes successfully terminated by ATP. Measurements:RV Sensing 8.1 mV  Impedence 399 Ohms, Defib 90 OhmsThreshold 1.0 V @ 0.40  msOutputs are set with appropriate safety margins based on capture thresholds. 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 Hx of Atrial flutter per review of Cardiology progress note, initially noted in 2022 and she is maintained on eliquis . She had established EP care back in July of 2024 after moving back to [place]  from [place] . Around that time, she had recently sustained an ICD shock, felt to be 2/2 Afib w/ RVR ISO her coming off of her meds. Following her visit on 04/08/2023, she had missed subsequent follow-up visits due to lack of transportation. Today, she reports she had been doing well overall and denies any complaints. Interrogation of her device today shows multiple NSVT events, mostly appear to be c/w atrial runs (Afib and/or possible SVT/AT). Two events were successfully terminated by ATP. One event appear c/w NSVT lasting 1 second long (event #360). She assures she had been completely compliant with her medication regimen without any missed dosing. At this time, will increase coreg  dosing to 37.5mg  BID. She will closely monitor her BPs at home. She is on digoxin  125mcg daily and eliquis  65m gBID. We discussed rhythm control strategies including AAD vs PVI should her arrhythmia burden persists or increases in burden. Her AAD options are limited given cardiomyopathy, relatively young age, and borderline prolonged QTc (though this is ISO widened QRSd). We discussed Afib risk factor modification. Since she was last seen, she had been on Mounjaro and had lost nearly 60lbs. While improved, her BMI is currently at 56. She will continue Mounjaro. We discussed increasing activity levels and exercise regimen though she reports her activity is fairly limited due to dyspnea. She would benefit from cardiac rehab. We had arranged for her to establish care with sleep medicine, unfortunately, she had missed her visit in February of 2025 due to lack of transportation. She states she has established a  means of transportation since and would like to try to arrange again. Thus, will place a new referral. We had a long discussion with regards to the utility of upgrading her device to CRT. Her EKGs overtime shows LBBB with progressively prolongation of QTRs, today, measuring . Overall, if indicated and recommended by Dr. Nadeen, she is not opposed to proceeding. Thus, will discuss this further with Dr. Nadeen. Will arrange for follow-up in 3 monthsFollow-up: 3 monthsIn the interim I have encouraged the patient to call with any concerns or worsening symptoms.Norleen Apley, APRNSection of Cardiovascular MedicineElectrophysiology333 [facility] [place] [phone] [address] [place] [phone] [address] [place], [place] [id number]/10/2025Electronically Signed by Norleen Apley, APRN, December 10, 2025Future Appointments Date Time Provider Department Center 09/03/2024  3:40 PM Angelo Pierini, Michaelle Asia, APRN [facility] MEDICAL 10/22/2024  3:00 PM Jason Credit, APRN [facility] MEDICAL 11/03/2024  1:20 PM Nosol, Megan, [place] [facility] MEDICAL 11/23/2024  4:00 PM Blitzer, Oneil CROME., MD CARD [facility] 12/22/2024  1:40 PM Corinthia Hussar, MD CARD [facility]

## 2024-08-26 ENCOUNTER — Encounter: Admit: 2024-08-26 | Payer: PRIVATE HEALTH INSURANCE

## 2024-08-26 ENCOUNTER — Encounter: Admit: 2024-08-26 | Payer: PRIVATE HEALTH INSURANCE | Attending: Pulmonary Disease

## 2024-08-27 ENCOUNTER — Encounter: Admit: 2024-08-27 | Payer: PRIVATE HEALTH INSURANCE | Attending: Vascular and Interventional Radiology

## 2024-08-27 DIAGNOSIS — I4891 Unspecified atrial fibrillation: Principal | ICD-10-CM

## 2024-08-27 DIAGNOSIS — I429 Cardiomyopathy, unspecified: Secondary | ICD-10-CM

## 2024-08-27 NOTE — Progress Notes [1]
 After further discussion with Dr. 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 To add RA and LV lead. Adding atrial lead to better differentiate atrial arrhythmia and if needed, atrial ATP. This will also allow for better tracking of atrial arrhythmia burden to guide future management. Discussed this at length with pt on the phone and she is agreeable to proceed, preferred after the holidays.Of note, she missed call attempt to schedule initial visit from Sleep Medicine yesterday. Advised her to call today for scheduling.

## 2024-09-02 ENCOUNTER — Inpatient Hospital Stay: Admit: 2024-09-02 | Payer: PRIVATE HEALTH INSURANCE

## 2024-09-14 ENCOUNTER — Encounter: Admit: 2024-09-14 | Payer: PRIVATE HEALTH INSURANCE

## 2024-09-14 ENCOUNTER — Telehealth: Admit: 2024-09-14 | Payer: PRIVATE HEALTH INSURANCE | Attending: Cardiovascular Disease

## 2024-09-14 NOTE — Telephone Encounter [36]
 Patient Name: Holly Bond, Anya[facility]                     [facility]: [address]                  Parking: Park in the [employer] garage. ([address] Enter the hospital on the 1st.  Floor. Go left when entering, continue down the hall till you get to the Coffee counter, then turn left- the Registration Dept.  is on your right. Through the glass doors. If being dropped off - Enter through the front of the hospital, turn left, the registration area is on the left after the cafeteria.  Valet park- for a fee, you may also valet park your car.                 Procedure: UpgradeDoctor/PA/APRN: BlitzerDate:  Thursday 11/04/24              You will be staying overnight - please come prepared                                                                                               Blood work:  Please go for blood work within 2 weeks of the procedure. You DO NOT need to fast. If you go to a [facility] lab, your orders are in the system. I will also mail you a copy of these instructions. You may go to any lab of your choice. Here is our fax number is: [phone], please make sure your blood work sent to us  at least 24hrs prior to the procedure.Time:  Arrival time is around 8 AM. Although this could possibly change, someone will call you from the hospital 1-2 days prior to procedure with instructions and confirm the time of arrival. If you do not hear from them, at least 24hrs prior please call: [phone].If you are a diabetic-, the hospital will give you instructions for your diabetic meds. When they call.         Medications: Hibiclens: This is an antiseptic skin cleanser. You can get it at any pharmacy (you can use generic). Shower or bath with hibiclens once a day for 2 days before your procedure. Rub it over your chest and upper left arm.                     Mounjaro- continue injection except do not do injection within 2 days of procedure Start a CLEAR LIQUID diet 24 HRS. prior to the procedureIf there are any medication, changes (new) please let us  know ahead of time.                 Nothing to eat or drink, including coffee from midnight before the procedure. Take your morning meds that are needed in the morning with a few sips of water.  FYI:  If surgery/procedure is performed within, 6 months of having a device implanted or battery (a.k.a. generator changed, new device), antibiotics need to be taken as a prophylaxis prior to the procedure. Preferably amoxicillin (unless  allergic) The surgeon should provide the information on this.Post-procedure appointment is on: 11/26/24 at 11 AM with Deland Silvan, PA in our [facility] office on [address]    If you have any other questions, please call me at: [phone]                                                      Holly Bond    J

## 2024-09-17 ENCOUNTER — Encounter: Admit: 2024-09-17 | Payer: PRIVATE HEALTH INSURANCE | Attending: Cardiovascular Disease

## 2024-09-17 ENCOUNTER — Encounter: Admit: 2024-09-17 | Payer: PRIVATE HEALTH INSURANCE

## 2024-09-17 DIAGNOSIS — Z95810 Presence of automatic (implantable) cardiac defibrillator: Principal | ICD-10-CM

## 2024-10-18 ENCOUNTER — Encounter: Admit: 2024-10-18 | Payer: PRIVATE HEALTH INSURANCE

## 2024-10-18 ENCOUNTER — Encounter: Admit: 2024-10-18 | Payer: PRIVATE HEALTH INSURANCE | Attending: Cardiovascular Disease

## 2024-10-18 DIAGNOSIS — Z95810 Presence of automatic (implantable) cardiac defibrillator: Principal | ICD-10-CM

## 2024-11-23 ENCOUNTER — Encounter: Admit: 2024-11-23 | Payer: PRIVATE HEALTH INSURANCE | Attending: Cardiovascular Disease

## 2024-11-26 ENCOUNTER — Encounter: Admit: 2024-11-26 | Payer: PRIVATE HEALTH INSURANCE | Attending: Vascular and Interventional Radiology

## 2024-12-22 ENCOUNTER — Encounter
Admit: 2024-12-22 | Payer: PRIVATE HEALTH INSURANCE | Attending: Student in an Organized Health Care Education/Training Program

## 2025-02-08 ENCOUNTER — Encounter: Admit: 2025-02-08 | Payer: PRIVATE HEALTH INSURANCE | Attending: Cardiovascular Disease
